# Patient Record
Sex: Female | Born: 1935 | Race: White | Hispanic: No | Smoking: Never smoker
Health system: Southern US, Community
[De-identification: ages and names within clinical notes are randomized; demographics above are authoritative.]

## PROBLEM LIST (undated history)

## (undated) DIAGNOSIS — I10 Essential (primary) hypertension: Secondary | ICD-10-CM

## (undated) DIAGNOSIS — M549 Dorsalgia, unspecified: Secondary | ICD-10-CM

## (undated) DIAGNOSIS — I5022 Chronic systolic (congestive) heart failure: Secondary | ICD-10-CM

## (undated) DIAGNOSIS — I639 Cerebral infarction, unspecified: Secondary | ICD-10-CM

## (undated) DIAGNOSIS — E079 Disorder of thyroid, unspecified: Secondary | ICD-10-CM

## (undated) DIAGNOSIS — E785 Hyperlipidemia, unspecified: Secondary | ICD-10-CM

## (undated) DIAGNOSIS — G8929 Other chronic pain: Secondary | ICD-10-CM

## (undated) DIAGNOSIS — N184 Chronic kidney disease, stage 4 (severe): Secondary | ICD-10-CM

## (undated) DIAGNOSIS — I272 Pulmonary hypertension, unspecified: Secondary | ICD-10-CM

## (undated) DIAGNOSIS — I482 Chronic atrial fibrillation, unspecified: Secondary | ICD-10-CM

## (undated) DIAGNOSIS — E039 Hypothyroidism, unspecified: Secondary | ICD-10-CM

## (undated) HISTORY — PX: OTHER SURGICAL HISTORY: SHX169

## (undated) HISTORY — DX: Pulmonary hypertension, unspecified: I27.20

## (undated) HISTORY — PX: APPENDECTOMY: SHX54

## (undated) HISTORY — PX: EYE SURGERY: SHX253

## (undated) HISTORY — DX: Cerebral infarction, unspecified: I63.9

## (undated) HISTORY — PX: TONSILLECTOMY: SUR1361

---

## 1997-06-27 ENCOUNTER — Ambulatory Visit (HOSPITAL_COMMUNITY): Admission: RE | Admit: 1997-06-27 | Discharge: 1997-06-27 | Payer: Self-pay | Admitting: *Deleted

## 2001-11-09 ENCOUNTER — Encounter: Payer: Self-pay | Admitting: Internal Medicine

## 2001-11-09 ENCOUNTER — Encounter: Admission: RE | Admit: 2001-11-09 | Discharge: 2001-11-09 | Payer: Self-pay | Admitting: Internal Medicine

## 2005-04-10 ENCOUNTER — Inpatient Hospital Stay (HOSPITAL_COMMUNITY): Admission: EM | Admit: 2005-04-10 | Discharge: 2005-04-13 | Payer: Self-pay | Admitting: Emergency Medicine

## 2005-08-08 ENCOUNTER — Ambulatory Visit (HOSPITAL_COMMUNITY): Admission: RE | Admit: 2005-08-08 | Discharge: 2005-08-08 | Payer: Self-pay | Admitting: Cardiology

## 2005-08-08 ENCOUNTER — Encounter: Payer: Self-pay | Admitting: Vascular Surgery

## 2005-08-26 ENCOUNTER — Inpatient Hospital Stay (HOSPITAL_COMMUNITY): Admission: RE | Admit: 2005-08-26 | Discharge: 2005-08-27 | Payer: Self-pay | Admitting: Interventional Cardiology

## 2005-11-05 ENCOUNTER — Encounter: Admission: RE | Admit: 2005-11-05 | Discharge: 2005-11-05 | Payer: Self-pay | Admitting: Internal Medicine

## 2006-08-15 ENCOUNTER — Inpatient Hospital Stay (HOSPITAL_COMMUNITY): Admission: EM | Admit: 2006-08-15 | Discharge: 2006-08-24 | Payer: Self-pay | Admitting: *Deleted

## 2006-08-20 ENCOUNTER — Encounter (INDEPENDENT_AMBULATORY_CARE_PROVIDER_SITE_OTHER): Payer: Self-pay | Admitting: Interventional Cardiology

## 2008-04-12 ENCOUNTER — Encounter: Admission: RE | Admit: 2008-04-12 | Discharge: 2008-04-12 | Payer: Self-pay | Admitting: Cardiology

## 2008-09-12 ENCOUNTER — Encounter: Admission: RE | Admit: 2008-09-12 | Discharge: 2008-09-12 | Payer: Self-pay | Admitting: Internal Medicine

## 2010-08-07 NOTE — Op Note (Signed)
Tonya Farley, Tonya Farley             ACCOUNT NO.:  [id number]   MEDICAL RECORD NO.:  [id number]          PATIENT TYPE:  INP   LOCATION:  4705                         FACILITY:  [facility]   PHYSICIAN:  Francisca December, M.D.  DATE OF BIRTH:  Jul 30, 1919   DATE OF PROCEDURE:  08/20/2006  DATE OF DISCHARGE:                               OPERATIVE REPORT   PROCEDURE PERFORMED:  1. Transesophageal echocardiogram.  2. Direct current cardioversion.   INDICATIONS:  Tonya Farley is an 77 year old woman who presented with  atrial fibrillation with rapid ventricular response and mild CHF.  She  has been therapeutically anticoagulated with heparin, Lovenox, and then  warfarin.  Her INR today is 2.3.  She has undergone a clearing TEE  revealing no significant thrombus in the left atrium or in the left  atrial appendage.  She is to undergo direct current cardioversion at  this time.   PROCEDURAL NOTE:  While monitoring heart rate, blood pressure, O2  saturation and ECG and after the IV administration of a total of 4 mg of  midazolam and 100 mcg of fentanyl, the patient received a single  transthoracic dose of direct current energy 150 joules synchronized  biphasic.  This resulted in prompt return of sinus rhythm 60 beats per  minute.   IMPRESSION:  Successful elective cardioversion of atrial fibrillation  with rapid ventricular response to sinus rhythm.   PLAN:  1. Continue warfarin likely indefinite.  2. Amiodarone 150 mg IV now.  She has had two runs of SVT since the      cardioversion was completed.  3. Begin sotalol 80 mg p.o. every 12 hours watching QTC carefully.      Francisca December, M.D.  Electronically Signed     JHE/MEDQ  D:  08/20/2006  T:  08/20/2006  Job:  [id number]

## 2010-08-07 NOTE — H&P (Signed)
Tonya Farley             ACCOUNT NO.:  [id number]   MEDICAL RECORD NO.:  [id number]          PATIENT TYPE:  INP   LOCATION:  1849                         FACILITY:  [facility]   PHYSICIAN:  Corky Crafts, MDDATE OF BIRTH:  1920/03/14   DATE OF ADMISSION:  08/15/2006  DATE OF DISCHARGE:                              HISTORY & PHYSICAL   HISTORY:  Tonya Farley is an 76 year old female with a 3-day history  of intermittent palpitations associated with weakness, chest tightness,  and mild dyspnea with exertion.  Symptoms were worse this morning with a  rapid rate.  She was down in the emergency room with a heart rate in the  130 range.  EKG shows atrial fibrillation with RVR, with a heart rate of  130, isolated PVC, no ST-T wave changes from April 11, 2005 EKG.   Cardiac catheterization in January 2007 showed no significant coronary  artery disease, EF 75%.  She has known bilateral renal artery stenosis  and underwent stent placement to the left renal artery in June of 2007.  She also has right renal artery stenosis at 50%.  She has further  peripheral vascular disease with a 90% stenosis at the right TP trunk,  left SFA occlusion, medically managed.   ALLERGIES:  PENICILLIN AND SULFA.   MEDICATIONS:  Norvasc 5 mg a day, baby aspirin daily, labetalol 100 mg  b.i.d., Lasix 20 mg a day, Lipitor 5 mg a day, Plavix 75 mg a day, Zetia  5 mg a day.   FAMILY HISTORY:  Mom died of rectal cancer.  Dad died at age 37 from a  myocardial infarction.  She has a sister who has had a myocardial  infarction and now has a defibrillator.   SOCIAL HISTORY:  No tobacco, alcohol, or illicit drug use.   PAST MEDICAL HISTORY:  1. Peripheral vascular disease as above.  2. Hypertension with LVH.  3. History of tachycardia.  4. Hyperlipidemia.  5. Restless legs syndrome.  6. History of medication noncompliance.  7. She has undergone a right breast lumpectomy which she described as  non-cancerous.  She denies any history of radiation or      chemotherapy.  8. She has undergone an appendectomy as well as a tonsillectomy.   PHYSICAL EXAMINATION:  VITAL SIGNS:  Blood pressure 158/78, pulse  ranging from 90 to 140 beats per minute, respirations 18, temperature  98.2.  HEENT:  Grossly normal.  Neck:  No carotid bruits.  No JVD or thyromegaly.  CHEST:  Clear to auscultation bilaterally.  No wheezing or rhonchi.  HEART:  Irregular.  No gross murmur.  ABDOMEN:  Soft, nontender.  No bruits.  EXTREMITIES:  Lower extremities no peripheral edema.  Faint palpable  right lower extremity pulse with non-palpable left lower extremity  pulse.  However, the left lower extremity is pink and warm.  NEURO:  Alert and oriented times 3.  Normal mood and affect.  SKIN:  Warm and dry.   LAB STUDIES:  Show BUN 23, creatinine 1.5, potassium 3.6, sodium 140.  Hemoglobin 13.3, hematocrit 39.  Point-of-care markers are negative with  a troponin of less than 0.05.   Chest x-ray shows cardiomegaly with low lung volumes with worsening of  bibasilar and perihilar atelectasis versus infiltrates.   ASSESSMENT/PLAN:  1. Atrial fibrillation with RVR.  2. Peripheral vascular disease as described above.  3. Hypertension with LVH.  4. Hyperlipidemia.  Treated.  5. Family history of coronary artery disease.  6. Atelectasis on the chest x-ray.  She has no fever or chills.  I      will repeat the chest x-ray tomorrow.  7. Mild renal insufficiency.  Check BMET in the morning.   Start IV Cardizem.  Stop Norvasc.  Cycle enzymes.  Check electrolytes as  well as TSH.  Check a repeat chest x-ray in the morning.  Admit to  telemetry floor.      Guy Franco, P.A.      Corky Crafts, MD  Electronically Signed    LB/MEDQ  D:  08/15/2006  T:  08/15/2006  Job:  [id number]   cc:   Tonya Farley, M.D.

## 2010-08-10 NOTE — Discharge Summary (Signed)
[name], Tonya Farley             ACCOUNT NO.:  [id number]   MEDICAL RECORD NO.:  [id number]          PATIENT TYPE:  INP   LOCATION:  6527                         FACILITY:  [facility]   PHYSICIAN:  Jonna L. Robb Matar, M.D.DATE OF BIRTH:  1919-05-07   DATE OF ADMISSION:  04/10/2005  DATE OF DISCHARGE:  04/13/2005                                 DISCHARGE SUMMARY   PRIMARY CARE PHYSICIAN:  Antony Madura, M.D.   CARDIOLOGIST:  Francisca December, M.D.   ALLERGIES:  PENICILLIN and SULFA, both give rashes.   FINAL DIAGNOSES:  1.  Noncardiac chest pain.  2.  Uncontrolled hypertension.  3.  Renal artery stenosis.  4.  Hyperlipidemia.   PROCEDURE:  On January 19th, left heart catheterization, abdominal  aortogram, selective renal angiography. Findings include LVH with a  hyperdynamic ejection fraction of 75%. No significant coronary artery  disease, bilateral renal artery stenosis with 75% on the left, 70% on the  right.   HOSPITAL COURSE:  This is a 77 year old white female with hypertension and  cholesterolemia developed chest pain, diaphoresis. She had restless leg  problems. The pain lasted until she was given nitroglycerin by the emergency  department  physician. She has a history of normal stress test in August  2006. Blood pressures were 151/59, 179/72.   Dr. Amil Amen was called on consultation. The patient ruled out for an MI. The  patient does take Advil, it was thought she might have some epigastric  rather some substernal. Because it was difficult to tell she underwent a  procedure and was found to have a source for her hypertension.   DISPOSITION:  The patient is going to be discharged on labetalol increased  to 200 mg b.i.d., Norvasc 5 mg daily, clonazepam as needed for her legs,  Tranxene 7.5 half tablet b.i.d. as needed, Crestor 5 mg daily, Centrum  Silver. She should also try some Prilosec OTC and avoid Advil.      Jonna L. Robb Matar, M.D.  Electronically  Signed     JLB/MEDQ  D:  04/13/2005  T:  04/14/2005  Job:  [id number]   cc:   Antony Madura, M.D.  Fax: [phone]   Francisca December, M.D.  Fax: [phone]

## 2010-08-10 NOTE — Consult Note (Signed)
Tonya Farley, MAGISTRO             ACCOUNT NO.:  [id number]   MEDICAL RECORD NO.:  [id number]          PATIENT TYPE:  INP   LOCATION:  6527                         FACILITY:  [facility]   PHYSICIAN:  Francisca December, M.D.  DATE OF BIRTH:  Jan 20, 1920   DATE OF CONSULTATION:  04/11/2005  DATE OF DISCHARGE:                                   CONSULTATION   REFERRING Braylynn Ghan:  [facility].   REASON FOR CONSULTATION:  Chest pain.   HISTORY OF PRESENT ILLNESS:  Tonya Farley is an 77 year old very pleasant  lady who is the sister of a long-time patient of mine, Mrs. Redge Gainer.  She was awoken in the wee hours of April 10, 2005 by anterior substernal  chest pain that persisted for several hours and was associated with  diaphoresis.  She eventually called EMS and was transported to [facility], receiving aspirin and some sublingual nitroglycerin en route.  The  discomfort in her chest, which she described as a heavy pressure without  radiation, slowly resolved after the nitroglycerin was given and  subsequently in the emergency room.  She denies any accompanying dyspnea.  She had pain in her legs as well, but commonly has this and is treated by  Dr. Su Hilt for restless leg syndrome with clonazepam.  She did have a  cardiology ergometric distal bicycle stress test in August '06 which was  felt to be unremarkable.   PAST MEDICAL HISTORY:  1.  Remote history of tachycardia.  2.  Hypertension.  3.  Hypercholesterolemia.   PAST SURGICAL HISTORY:  1.  Right breast lumpectomy.  2.  Appendectomy.  3.  Bilateral cataract surgery.  4.  Remote tonsillectomy.   CURRENT MEDICATIONS:  1.  Centrum vitamin.  2.  Clonazepam 0.5 mg p.o. nightly.  3.  Labetalol 100 mg p.o. twice daily.  4.  Clorazepate 7.5 mg one-half p.o. twice daily.  5.  Crestor 5 mg p.o. daily.   SOCIAL HISTORY AND HABITS:  She uses no alcohol or tobacco.  She has never  been a smoker.  Caffeine intake is not  excessive.   FAMILY HISTORY:  Mother died of rectal cancer.  Father died of an MI at age  67.  Her sister had an anterior MI approximately 8-10 years ago and has  severe LV dysfunction with a defibrillator in place.   CURRENT MEDICATIONS IN THE HOSPITAL:  1.  Lovenox 40 mg subcu daily.  2.  Protonix 40 mg p.o. daily.  3.  Norvasc 5 mg p.o. daily.  4.  Zocor 20 mg p.o. daily.  5.  Labetalol.  6.  Aspirin.   REVIEW OF SYSTEMS:  Review of systems otherwise negative.   PHYSICAL EXAMINATION:  VITAL SIGNS:  The blood pressure is 140-180/60, pulse  is 80 and regular, respiratory rate 20, temperature 98, O2 saturation 94% on  room air.  GENERAL:  This is a well-appearing 77 year old woman in no acute distress.  HEENT:  Unremarkable.  The head is normocephalic and atraumatic.  The pupils  are equal, round and reactive to light and accommodation.  Extraocular  movements are intact.  Oral mucosa is pink and moist.  Tongue is not coated.  NECK:  Neck is supple without thyromegaly or masses.  Carotid upstrokes are  normal.  No bruit.  No jugular venous distention.  CHEST:  Her chest is clear with adequate excursion, no wheezes, rales or  rhonchi.  HEART:  The heart has a regular rhythm.  Normal S1 and S2 are normal.  No  S3, S4, murmur, click or rub noted.  ABDOMEN:  The abdomen is soft, flat and nontender without hepatosplenomegaly  or midline pulsatile mass.  Bowel sounds are present in all quadrants.  GU:  External genitalia are atrophic.  RECTAL:  Not performed.  EXTREMITIES:  Extremities show a full range of motion, no edema, intact  distal pulses.  NEUROLOGICAL:  Cranial nerves II-XII are intact.  Motor and sensory are  grossly intact.  Gait not tested.  SKIN:  Skin is warm, dry and clear.   ACCESSORY CLINICAL DATA:  CK-MB and troponins are negative x3.  Serum  electrolytes are normal.  Admission hemogram is normal.   Chest x-ray shows no active cardiopulmonary disease.   The  electrocardiogram on admission showed criteria for LVH by R wave in aVL  and nonspecific T wave abnormality,  subsequent EKG this morning unchanged.   ASSESSMENT:  1.  Episode of rest angina pectoris, therefore unstable.  2.  No evidence of myocardial infarction.  3.  Hypertension, systolic.  4.  Evidence of end-organ damage from hypertension (left ventricular      hypertrophy).  5.  Multiple risk factors for coronary disease including age, hypertension,      dyslipidemia and family history.   PLAN:  1.  I agree with your management thus far.  2.  I have recommended cardiac catheterization and coronary angiography for      diagnosis and possible PCI.  Goals, risks and alternatives were      discussed with the patient.  The patient states understanding, has had      her questions answered, and wishes to proceed.   Thank you very much for allowing me to assist in the care of Ms. Maire  Farley.  It has been a pleasure to do so.  I will discuss her further  care with you.      Francisca December, M.D.  Electronically Signed     JHE/MEDQ  D:  04/11/2005  T:  04/12/2005  Job:  [id number]   cc:   Antony Madura, M.D.  Fax: [phone]

## 2010-08-10 NOTE — H&P (Signed)
Tonya Farley, Tonya Farley             ACCOUNT NO.:  [id number]   MEDICAL RECORD NO.:  [id number]          PATIENT TYPE:  EMS   LOCATION:  MAJO                         FACILITY:  [facility]   PHYSICIAN:  Michaelyn Barter, M.D. DATE OF BIRTH:  [birth year]   DATE OF ADMISSION:  04/10/2005  DATE OF DISCHARGE:                                HISTORY & PHYSICAL   PRIMARY CARE PHYSICIAN:  Dr. Burton Apley.   CHIEF COMPLAINT:  Chest pain.   HISTORY OF PRESENT ILLNESS:  Tonya Farley is an 77 year old female with a  past medical history of hypertension and hypercholesterolemia.  She states  that sometime after midnight last night, she was awakened by chest pain  accompanied by diaphoresis.  Her pajama's became soaked with sweat and she  therefore had to change them.  She has a chronic history with her legs  hurting at night time and she has been prescribed medication by her primary  care physician for this.  She states that shortly after waking up with chest  pain, both of her legs began to hurt.  When I asked her if this was  something new, she said, no, she has had it for quite some time.  Because of  the pain in her legs, it is very difficult for her to keep them still.  She  denies having any shortness of breath.  The chest pain is described as a  heavy pressure.  There is no radiation to the neck, arm or back.  It lasted  until nitroglycerin was given by the emergency room physician. She has never  had similar pain before.  No changes in her breathing with regards with  exertion.  The patient had a cardiac stress test completed in August of 2006  and was found to be normal.  This was done by her primary care physician in  his office.  During that time, she says she rode a bicycle.   PAST MEDICAL HISTORY:  1.  Tachycardia.  2.  Hypertension.  3.  Hypercholesterolemia.   PAST SURGICAL HISTORY:  1.  Right breast lumpectomy which was found to be benign.  2.  Appendectomy.  3.  Bilateral  cataract removal.  4.  Tonsillectomy.   ALLERGIES:  Penicillin produces a rash.  Sulfa produces a rash.   CURRENT MEDICATIONS:  1.  Centrum vitamins.  2.  Clonazepam one-half of a tablet q.h.s. for her legs.  3.  Labetalol 100 mg p.o. b.i.d.  4.  Clorazepate dipot 7.5 mg, a half a tablet b.i.d. p.r.n.  5.  Crestor 10 mg, one half a tablet daily.   SOCIAL HISTORY:  Cigarettes:  Never.  Alcohol:  Never.   FAMILY HISTORY:  Mother:  Rectal cancer.  Father died from myocardial  infarction at the age of 51.  Sister had a history of pacemaker and  defibrillator.   REVIEW OF SYSTEMS:  As per history of present illness.  Otherwise, all other  systems are negative.   PHYSICAL EXAMINATION:  GENERAL:  The patient is awake.  She is cooperative.  She is in no obvious distress.  VITAL SIGNS:  Temperature 97.0, blood  pressure 151/59, heart rate 72, respirations 20, O2 saturation 96% on room  air.  HEENT:  Head is normocephalic, atraumatic.  Pupils are anicteric.  Decreased reactivity to light.  Extraocular muscles are intact.  Oral mucosa  is pink.  Dentures are present.  No obvious thrush.  NECK:  Supple.  No  lymphadenopathy.  No JVD.  Strong carotid upstrokes bilaterally.  No carotid  bruits.  CARDIAC:  S1 and S2 is present.  Heart sounds are distant.  RESPIRATORY:  Lungs are clear.  No crackles or wheezes.  ABDOMEN:  Soft,  nontender and nondistended.  Positive biopsy x4 quadrants.  No masses  palpated.  EXTREMITIES:  No leg edema.   LABORATORY DATA:  ABG was completed with a pH of 7.460, pCO2 of 38.2, bicarb  27.2, hemoglobin 12.6, hematocrit 37.0.  D-dimer is 0.31.  Sodium 138,  potassium 3.8, chloride 108, CO2 140, BUN 18, creatinine 1.3, glucose 140.  CK MB, PLC 1.5.  Troponin I, less than 0.05.  Myoglobin 148.  Chest x-ray  was completed, reveals no congestive heart failure, increased basilar  markings, probably chronic but cannot rule out active disease.  EKG was also  completed;  sinus rhythm, bradycardic with the rate in the 50s, good R wave  transition is achieved by V3.  No Q waves.  No ST segment changes.   ASSESSMENT AND PLAN:  Tonya Farley is an 77 year old female with a past  medical history of hypertension and hypercholesterolemia here for evaluation  of acute onset of chest pain.  1.  Chest pain.  Rule out myocardial infarction.  The source of the      patient's chest pain will be worked up from a cardiac versus noncardiac      perspective.  The patient reports having had a stress test completed      only five months ago and at that time, it was determined to be normal.      Given a history such as that, one has to be concerned that more than      likely this may not be cardiac chest pain however, the patient does have      multiple risk factors therefore, will rule the patient out for cardiac      event via checking cardiac enzymes, troponin I and CK MB x3, q.8h.      apart.  Will also provide aspirin, oxygen and will consider consultation      with cardiology.  2.  Hypertension.  This is currently uncontrolled.  Will review the      patient's home medications and will consider either increasing the      dosages of the medications or adding an additional blood pressure      medication.  3.  History of hyperlipidemia.  Will continue the patient's Crestor for now.  4.  Gastrointestinal prophylaxis.  Will provide Protonix.  5.  Deep vein thrombosis prophylaxis.  Will provide Lovenox.      Michaelyn Barter, M.D.  Electronically Signed     OR/MEDQ  D:  04/10/2005  T:  04/10/2005  Job:  [id number]   cc:   Antony Madura, M.D.  Fax: [phone]

## 2010-08-10 NOTE — Discharge Summary (Signed)
Tonya Farley, Tonya Farley             ACCOUNT NO.:  [id number]   MEDICAL RECORD NO.:  [id number]          PATIENT TYPE:  INP   LOCATION:  4705                         FACILITY:  [facility]   PHYSICIAN:  Francisca December, M.D.  DATE OF BIRTH:  [birth year]   DATE OF ADMISSION:  08/15/2006  DATE OF DISCHARGE:  08/24/2006                               DISCHARGE SUMMARY   ADMISSION DIAGNOSES:  1. Atrial fibrillation with rapid ventricular response.  2. Dyspnea.   DISCHARGE DIAGNOSIS:  1. Atrial fibrillation with rapid ventricular response, status post      transesophageal echo with direct-current cardioversion to sinus      rhythm.  2. Dyspnea secondary to acute congestive heart failure exacerbation      secondary to #1, compensated.  3. Systemic anticoagulation with therapeutic INR.  4. Cardiac catheterization in January 2007 revealed no significant      coronary artery disease with an ejection fraction of 75%.  5. Bilateral renal artery stenosis, status post left renal artery      stent in June 2007.  Right renal artery stenosis of 50%.  6. Peripheral vascular disease with 90% stenosis at the right      posterior tibial trunk, left superficial femoral artery occlusion,      medically managed.  7. Hypertension with moderate control.  8. Renal insufficiency, rule out acute versus chronic.  9. Nonsustained ventricular tachycardia.  10.Marked sinus bradycardia, improved.   CONSULTATIONS:  None.   PROCEDURE:  Transesophageal echo with direct-current cardioversion, Aug 20, 2006.   HOSPITAL COURSE:  Tonya Farley is an 77 year old female who was  admitted to the [facility] with atrial fibrillation with RVR  and a mild acute CHF exacerbation secondary to atrial fibrillation.  She  has been therapeutically anticoagulated with heparin, Lovenox, and then  warfarin.  During this admission, she underwent a TEE that ruled out  thrombus in the left atrium or in the left atrial appendage;  as a  result, she underwent a DCCV with successful conversion to sinus rhythm.  Warfarin was continued.  Prior to the DCCV, she was on IV Cardizem,  which was switched to oral; that was later discontinued and following  the DCCV procedure, she was started on amiodarone 150 mg via IV after  having 2 runs of nonsustained SVT since the cardioversion had been  completed.  She was also started on sotalol 80 mg every 12 hours with a  plan to watch the QTc.  The patient was started on Lasix for treatment  of mild heart failure, with improvement in dyspnea.  Her labetalol was  switched to metoprolol during this admission prior to the DCCV.  Also  during this admission, the patient experienced mild renal insufficiency,  rule out acute versus chronic; this may have been exacerbated by  diuretics.  Sotalol dosage was decreased to 80 mg daily secondary to a  prolonged QTc of 568 milliseconds, as well as a decreased creatinine  clearance.  A decision was made to discontinued the patient's  antiarrhythmic therapy so that her QTc could resolve.  She was  discharged to  home in stable condition on August 24, 2006 without  complaints or symptoms, and was scheduled for a followup appointment at  [facility] on the following day.   LABORATORY DATA:  White blood count 8.7, hemoglobin 12.3, hematocrit  37.1, platelets 261,000.  PT 31.8, INR 2.9, PTT greater than 200.  Heparin level 0.49.  Sodium 143, potassium 3.9, chloride 106, CO2 27,  glucose 93, BUN 21, creatinine 1.47, total bilirubin 1.2, alkaline  phosphatase 74, AST 38, ALT 46, total protein 6.3, serum albumin 3.6.  TSH 3.761.  BNP 556 prior to discharge.  Serial cardiac enzymes:  CK  total 113, 104, and 77; CK-MB 4.0, 3.9, and 3.3; troponin I 0.03, 0.06,  and 0.04.  Point-of-care enzymes:  Myoglobin 189, CK-MB 2.8, troponin I  less than 0.05.   CHEST X-RAYS:  PA and lateral chest x-ray, Jul 28, 2006, revealed  interval improvement in interstitial  edema with persistent bibasilar  opacities, stable cardiomegaly with small effusions.   TWELVE LEAD EKG:  EKG, Aug 21, 2006, revealed sinus rhythm with  occasional PVCs, with a ventricular rate of 64 beats per minute.  There  were no acute ischemic changes noted on this EKG.   CONDITION ON DISCHARGE:  Stable.   DISCHARGE MEDICATIONS:  1. Lipitor 5 mg daily.  2. Zetia 10 mg one-half tablet daily.  3. Aspirin 81 mg daily.  4. Lasix 40 mg daily; this represents an increase in dosage; a      prescription was given with refills.  5. Potassium chloride 20 mEq daily; this represented a new      prescription; a prescription was given with refills.  6. Warfarin 5 mg one-half tablet daily as directed; this represented a      new prescription; a prescription was given with refills.  7. Plavix 75 mg daily.  8. Labetalol 100 mg twice daily.  9. Norvasc 5 mg daily.   DISCHARGE INSTRUCTIONS:  1. Discontinued sotalol.  2. Continue a low-sodium, low-fat, and low-cholesterol diet.  3. Increase activity slowly.  4. Stop any activity that causes chest pain, shortness of breath,      dizziness, sweating, or excessive weakness.   FOLLOWUP APPOINTMENT:  1. [facility] for PT and INR check on June 2 at      12 p.m.  2. Followup appointment with Dr. Corliss Marcus with an EKG on June 2 at      11:40 a.m.  The patient was scheduled to call [phone] if she was      unable to keep the scheduled appointment.     Tylene Fantasia, [place]      Francisca December, M.D.  Electronically Signed   RDM/MEDQ  D:  09/03/2006  T:  09/04/2006  Job:  [id number]

## 2010-08-10 NOTE — Op Note (Signed)
Tonya Farley, Tonya Farley             ACCOUNT NO.:  [id number]   MEDICAL RECORD NO.:  [id number]          PATIENT TYPE:  INP   LOCATION:  6533                         FACILITY:  [facility]   PHYSICIAN:  Corky Crafts, MDDATE OF BIRTH:  05-Nov-1919   DATE OF PROCEDURE:  08/26/2005  DATE OF DISCHARGE:  08/27/2005                                 OPERATIVE REPORT   REFERRING PHYSICIAN:  Dr. Corliss Marcus.   PRIMARY PHYSICIAN:  Dr. Burton Apley.   PROCEDURES PERFORMED:  Bilateral lower extremity runoffs, bilateral  selective renal angiogram, PTA/stent of the left renal artery.   OPERATOR:  Dr. Eldridge Dace.   INDICATIONS:  Refractory hypertension and lower extremity claudication.   PROCEDURAL NARRATIVE:  The risks and benefits of peripheral angiography and  intervention were explained to the patient, and informed consent was  obtained.  The patient was brought to the peripheral vascular lab.  One  percent lidocaine was infiltrated into her right groin.  A 6-French arterial  sheath was placed in the patient's right femoral artery.  A pigtail catheter  was advanced into the infrarenal abdominal aorta.  Power injection of  contrast was performed, and bilateral lower extremity runoffs were  performed.  A 5-French JR-4 diagnostic catheter was then used to engage the  ostium of the right renal artery.  Digital angiography was performed using  hand injection of contrast.  Catheter was then used to engage the left renal  artery.  Digital angiography was performed using hand injection of contrast.  A stabilizer wire was then placed across the lesion in the left renal  artery.  The diagnostic catheter was then withdrawn with the stabilizer wire  remaining in place.  A Wholey wire was advanced into the aorta.  A 6-French  IMA guiding catheter was then advanced over both the stabilizer wire and the  Wholey wire to the level of the left renal artery.  The [facility] wire was  withdrawn, and the IMA guiding  catheter was advanced into the ostium of the  left renal artery.  Two-hundred mcg of the intra-arterial nitroglycerin were  administered through the guiding catheter.  A 3.0 x 12 mm Maverick Balloon  was advanced across the lesion and inflated to 8 atmospheres for 30 seconds.  The balloon was withdrawn, and then a 4.0 x 15 mm Genesis Stent was advanced  across the lesion and inflated to 14 atmospheres for 30 seconds.  There was  an excellent angiographic result with normal flow into the kidney.  Other  findings:  There was a 50% right renal artery stenosis; there is an 80% left  renal artery stenosis with a 90-mm pressure gradient.  Angiography of the  right leg revealed that the right iliac system was widely patent.  The right  SFA had moderate atherosclerosis without any areas of focal stenosis.  The  right profunda was widely patent.  There was a 90% stenosis of the right TP  trunk.  The anterior tibial artery on the right was widely patent.  The left  leg showed a widely patent iliac system.  The left SFA was occluded at  its  origin, and it reconstitutes in the distal portion of the thigh.  The distal  SFA was supplied by collaterals from the left profunda femoral.  The left  anterior tibial was widely patent.  The left TP trunk appeared patent.  Below the knee, only the dorsalis pedis was seen well from that point.   IMPRESSION:  1.  Bilateral renal artery stenosis including an 80% left renal artery      stenosis.  The right renal artery has a moderate stenosis.  2.  Successful PTA/stent to the left renal artery with a 4.0 x 15 mm Genesis      Stent.  3.  Occluded left SFA with collaterals from the profunda femoral.  4.  Moderate atherosclerosis of the right superficial femoral artery.  5.  Significant disease of the right TP trunk.   RECOMMENDATIONS:  1.  The patient should continue aspirin 81 mg p.o. daily indefinitely.  Will      add Plavix 75 mg daily for 30 days.  The patient  received a 300-mg bolus      postprocedure.  2.  When the Plavix course is finished, will likely try Pletal and encourage      walking to hopefully improve her circulation to her left leg.  At this      point, I will not plan any further lower extremity interventions.      Because her pain in her right leg was actually worse than her left leg,      it makes me question whether this was in fact vascular pain.  3.  Will follow blood pressure closely.  Hopefully, we will be able to      reduce the amount of blood pressure medications that she is on.  4.  Will follow her renal function closely as well.  The patient will be      watched overnight and hydrated aggressively with sodium bicarbonate and      half normal saline.  5.  I will see the patient in the office in followup.      Corky Crafts, MD  Electronically Signed     JSV/MEDQ  D:  08/26/2005  T:  08/27/2005  Job:  [id number]

## 2010-08-10 NOTE — Cardiovascular Report (Signed)
Tonya Farley             ACCOUNT NO.:  [id number]   MEDICAL RECORD NO.:  [id number]          PATIENT TYPE:  INP   LOCATION:  6527                         FACILITY:  [facility]   PHYSICIAN:  Francisca December, M.D.  DATE OF BIRTH:  1919/10/21   DATE OF PROCEDURE:  04/12/2005  DATE OF DISCHARGE:                              CARDIAC CATHETERIZATION   PROCEDURES PERFORMED:  1.  Left heart catheterization.  2.  Left ventriculogram.   1.  Abdominal aortogram.  2.  Coronary angiography.  3.  Selective bilateral renal artery angiography.   INDICATIONS:  Tonya Farley is an 77 year old woman who was admitted,  April 10, 2005, after prolonged episode of anginal chest pain.  She ruled  out for myocardial infarction.  Because of her age and other risk factors,  she is brought to catheterization laboratory at this time to identify the  extent of disease and provide for further therapeutic options.  She has a  long history of hypertension currently treated with labetalol 100 b.i.d. and  Norvasc 5 mg p.o. every day.  Systolic blood pressures have been in the  range of 160 to 180 since admission.   PROCEDURAL NOTE:  The patient is brought to cardiac catheterization  laboratory in the fasting state.  The right groin was prepped and draped in  the usual sterile fashion.  Local anesthesia was obtained with the  infiltration of 1% lidocaine.  A 6-French catheter sheath was inserted  percutaneously into the right femoral artery, utilizing an anterior approach  over a guiding J-wire.  A 110-cm pigtail catheter was used to measure  pressures in the ascending aorta and in the left ventricle, both prior to  and following the ventriculogram.  A 30 degree RAO cine left ventriculogram  was performed utilizing a power injector.  The pigtail catheter was then  withdrawn into the abdominal aorta and placed over the second lumbar  vertebrae. A cine angiogram the of the abdominal aorta was performed in  the  AP projection utilizing a power injector, 30 cc were injected at 15 cc/sec.  The patient then received a sublingual administration of 0.4 mg  nitroglycerin and cine angiography of each coronary artery was conducted in  multiple LAO and RAO projections, utilizing 6-French #4 left and right  Judkins catheters.  All catheter manipulations were performed using  fluoroscopic observation and exchanges performed over a long guiding J-wire.  The right coronary catheter was then withdrawn into the abdominal aorta and  used to perform selective renal angiography of the right and left renal  arteries.  Each was visualized by hand injection and AP projection.  The  right coronary catheter was then removed and a right femoral arteriogram  performed in the RAO angulation via the catheter sheath, again by hand  injection.  This documented adequate anatomy for placement of the  percutaneous closure device, AngioSeal.  This was successfully deployed with  good hemostasis and an intact distal pulse.   HEMODYNAMIC RESULTS:  1.  Systemic arterial pressure was 209/79 with a mean of 132-mmHg.  There      was no systolic  gradient across the aortic valve.  2.  The left ventricular end-diastolic pressure was 7-mmHg pre-      ventriculogram, and 12-mmHg post ventriculogram.   ANGIOGRAPHY:  1.  The left ventriculogram demonstrated normal chamber size and normal      global systolic function without regional wall motion abnormality.  A      visual estimate of the ejection fraction is 75%.  There is clear      moderate concentric hypertrophy seen.  The aortic valve is trileaflet      and opens normally during systole.  There is no significant mitral      regurgitation.  There is minimal left coronary artery calcification      seen.  2.  The abdominal aortogram revealed diffuse atherosclerotic disease but      without significant obstruction.  The very proximal common iliac      arteries are free of  obstruction.  The left renal artery appears to have      a 50% or more stenosis.  The left renal artery also seems to have a      eccentric stenosis in the range of 50%.  Neither vessel was extremely      well visualized.  3.  There was a right dominant coronary system present.  The main left      coronary artery was normal.  4.  The left anterior descending artery and its branches were without      significant obstruction.  There is a 30% mid vessel stenosis just after      the origin of a large diagonal branch.  The diagonal branch itself has a      30% stenosis proximally.  There is diffuse disease in the proximal      circumflex but not greater than 20% stenosis in any portion.  The      ongoing anterior descending artery reaches and barely traverses the      apex.  5.  The left circumflex coronary artery and its branches are free of any      significant disease.  There are two large marginal branches and then a      very small proximal first marginal branch.  Again, no obstructions seen      in the circumflex system.  6.  The right coronary and its branches are minimally diseased as well.      There is a tubular 30-40% narrowing in the proximal to mid segment.  The      distal vessel appears free of disease and gives rise to a large      posterior descending artery and a large posterolateral segment with a      single moderate size left ventricular branch.  7.  Selective renal angiography demonstrates a 75% narrowing beginning at      the ostium of the left renal artery and there was pressure catheter      pressure damping when the vessel was engaged of at least 40-mmHg.  The      right renal artery demonstrates a 70% narrowing as well.  This was an      eccentric lesion and extends over the first 12-mm of the artery.   FINAL IMPRESSION:  1.  Systemic hypertension with likely significant renal vascular component. 2.  Evidence of hypertensive heart disease with moderate concentric  left      ventricular hypertrophy.  3.  No significant obstructive coronary disease.  4.  Intact global left  ventricular systolic function. Ejection fraction 75%.   PLANS/RECOMMENDATIONS:  1.  Aggressive hypertensive treatment should be undertaken.  The patient is      currently managed with labetalol and Norvasc.  A third drug will likely      be necessary or at least an increase in her labetalol dose.  2.  She has normal renal function.  I will discuss possible intervention of      the renal arteries with Dr. Everette Rank and proceed as he feels      appropriate.      Francisca December, M.D.  Electronically Signed     JHE/MEDQ  D:  04/12/2005  T:  04/12/2005  Job:  [id number]   cc:   Antony Madura, M.D.  Fax: [phone]

## 2010-08-10 NOTE — Discharge Summary (Signed)
Tonya Farley, Tonya Farley             ACCOUNT NO.:  [id number]   MEDICAL RECORD NO.:  [id number]          PATIENT TYPE:  INP   LOCATION:  6533                         FACILITY:  [facility]   PHYSICIAN:  Corky Crafts, MDDATE OF BIRTH:  09/11/1919   DATE OF ADMISSION:  08/26/2005  DATE OF DISCHARGE:  08/27/2005                                 DISCHARGE SUMMARY   ADMISSION DIAGNOSIS:  1.  Bilateral renal artery stenosis.  2.  Lower extremity claudication.   DISCHARGE DIAGNOSIS:  1.  Bilateral renal artery stenosis status post lower extremity angiogram      status post left renal artery stent.  2.  Lower extremity claudication, improved.  3.  Restless leg syndrome.  4.  Uncontrolled hypertension.  5.  Hyperlipidemia.  6.  History of noncardiac chest pain, stable during this admission without      angina.  7.  History of tachycardia in the past.  8.  Status post right breast lumpectomy.  9.  Status post cataract surgery.  10. Status post tonsillectomy.  11. Status post appendectomy.   PROCEDURES:  1.  Lower extremity runoff.  2.  Selective renal angiogram.  3.  PTA/stent to the left renal artery.   FINDINGS:  1.  Right renal artery stenosis 50%.  2.  Left renal artery stenosis 80% with 90 mmHg gradient.   Right leg:  1.  Right iliac system appears patent.  2.  Right SFA moderately diseased diffusely.  3.  Patent right profunda.  4.  A 90% stenosis of the right TP trunk.  5.  Patent anterior tibial.   Left leg:  1.  Widely patent iliac.  2.  Left SFA occluded at origin; reconstitutes in mid thigh.  3.  Left AT widely patent.  4.  Left TP trunk appears patent, only DP seen well from that point.  5.  Successful PTA/stent placed to the left renal artery with a 4 x 15 mm      Genesis stent.  6.  Occluded left SFA with collaterals via profunda.  7.  Moderate athero right SFA and significant lesions.  8.  Significant disease at the right TP trunk below the knee.   HOSPITAL  COURSE:  Tonya Farley is an 77 year old female with a history of  uncontrolled hypertension and lower extremity claudication secondary to  uncontrolled hypertension.  She underwent a cardiac catheterization in  January and was found to have bilateral renal artery stenosis.  She also  stated that she had transient renal failure after being started on an ACE  inhibitor in the past.  Since that time she has also complained of symptoms  consistent with restless leg syndrome.  She underwent a lower extremity  arterial duplex showing significant SFA disease bilaterally.  She presented  to the [facility] Cardiology office and was referred to Tonya Farley by his  partner, Dr. Corliss Marcus, and was scheduled for an elective lower extremity  angiogram at the [facility] on Monday, August 26, 2005.  Based on the  angiogram, she was found to have 50% right renal artery stenosis and 80%  left renal artery stenosis with 90 mmHg gradient.  A successful PTA stent  was placed to the left renal artery with a 4 x 0 x 15 mm Genesis stent.  The  procedure was tolerated without complications.  She was kept overnight for  observation.  The patient's sheath was pulled after several hours without  complications and her groin was stable without bleeding, bruising or  hematoma.  She is being discharged to home today in stable condition with  the plan to remain on enteric coated aspirin 81 mg daily, indefinitely.  She  will take Plavix 75 mg daily for 30 days.  When she is finished with the  Plavix she will likely start and Pletal and walking will be encouraged.  Her  blood pressure will be followed closely and pending better control of her  blood pressure, consideration of decreasing her blood pressure medication  will be made.  During this admission, the patient was found to be  hypokalemic.  Her potassium was repleted orally this morning and then re-  dosed again 1 hour after the first repletion.  She is being  discharged to  home today in stable condition.   LABORATORY DATA:  Sodium 145, potassium 3.2, chloride 106, CO2 30, BUN 16,  creatinine 1.2, glucose 94, white blood count 10.6, hematocrit 11.3,  hemoglobin 31.5, platelet 320.   X-RAYS:  None during this admission.   EKG:  None during this admission.   CONDITION UPON DISCHARGE:  [name] is being discharged to home today  in stable condition.  She is ambulated in the hallway without any complaints  of angina, shortness of breath, palpitations, claudication, or groin pain.   DISCHARGE MEDICATIONS:  1.  Plavix 75 mg for 30 days.  This represents a new prescription.  A      prescription was given without refills.  2.  Enteric coated aspirin 81 mg daily.  3.  Triamterene/HCTZ 70/50 mg daily.  4.  Norvasc 10 mg daily.  5.  Crestor 10 mg daily.  6.  Tranxene 7.5 mg as needed.  7.  Centrum multivitamin daily.  8.  Labetalol 50 mg twice daily.  This represents a decrease in the previous      dosage, a prescription was given with refills.   DISCHARGE INSTRUCTIONS:  1.  The patient was instructed to resume her previous low salt diet.  2.  The patient has been instructed to avoid driving for 3 days.  3.  The patient has also been instructed to avoid lifting greater than 10      pounds for 1 week.   FOLLOWUP ARRANGEMENTS:  A followup appointment has been scheduled with Dr.  Eldridge Dace for September 09, 2005, at 9:30 a.m. in the [facility] Cardiology office.  The patient is scheduled to call [phone] if she is unable to make the  scheduled appointment.  She will also followup with Dr. Amil Amen.      [address] [place], [place]      Corky Crafts, MD  Electronically Signed    RDM/MEDQ  D:  08/27/2005  T:  08/27/2005  Job:  [id number]   cc:   Antony Madura, M.D.  Fax: [phone]   Corky Crafts, MD  Fax: [phone]

## 2010-10-09 ENCOUNTER — Other Ambulatory Visit: Payer: Self-pay | Admitting: Internal Medicine

## 2010-10-09 ENCOUNTER — Ambulatory Visit
Admission: RE | Admit: 2010-10-09 | Discharge: 2010-10-09 | Disposition: A | Discharge status: Home / Self Care | Payer: Medicare Other | Source: Ambulatory Visit | Attending: Internal Medicine | Admitting: Internal Medicine

## 2010-10-09 DIAGNOSIS — Z1239 Encounter for other screening for malignant neoplasm of breast: Secondary | ICD-10-CM

## 2011-01-10 LAB — PROTIME-INR
INR: 2.9 — ABNORMAL HIGH
Prothrombin Time: 31.8 — ABNORMAL HIGH

## 2012-06-11 ENCOUNTER — Encounter (HOSPITAL_COMMUNITY): Payer: Self-pay | Admitting: Emergency Medicine

## 2012-06-11 ENCOUNTER — Emergency Department (HOSPITAL_COMMUNITY)
Admission: EM | Admit: 2012-06-11 | Discharge: 2012-06-11 | Disposition: A | Discharge status: Home / Self Care | Payer: Medicare Other | Attending: Emergency Medicine | Admitting: Emergency Medicine

## 2012-06-11 ENCOUNTER — Emergency Department (HOSPITAL_COMMUNITY): Payer: Medicare Other

## 2012-06-11 DIAGNOSIS — M545 Low back pain, unspecified: Secondary | ICD-10-CM | POA: Insufficient documentation

## 2012-06-11 DIAGNOSIS — M5380 Other specified dorsopathies, site unspecified: Secondary | ICD-10-CM | POA: Insufficient documentation

## 2012-06-11 DIAGNOSIS — M549 Dorsalgia, unspecified: Secondary | ICD-10-CM

## 2012-06-11 DIAGNOSIS — E785 Hyperlipidemia, unspecified: Secondary | ICD-10-CM | POA: Insufficient documentation

## 2012-06-11 DIAGNOSIS — Y9389 Activity, other specified: Secondary | ICD-10-CM | POA: Insufficient documentation

## 2012-06-11 DIAGNOSIS — I10 Essential (primary) hypertension: Secondary | ICD-10-CM | POA: Insufficient documentation

## 2012-06-11 DIAGNOSIS — M4856XA Collapsed vertebra, not elsewhere classified, lumbar region, initial encounter for fracture: Secondary | ICD-10-CM

## 2012-06-11 DIAGNOSIS — Z7901 Long term (current) use of anticoagulants: Secondary | ICD-10-CM | POA: Insufficient documentation

## 2012-06-11 DIAGNOSIS — Y92009 Unspecified place in unspecified non-institutional (private) residence as the place of occurrence of the external cause: Secondary | ICD-10-CM | POA: Insufficient documentation

## 2012-06-11 DIAGNOSIS — W07XXXA Fall from chair, initial encounter: Secondary | ICD-10-CM | POA: Insufficient documentation

## 2012-06-11 DIAGNOSIS — M62830 Muscle spasm of back: Secondary | ICD-10-CM

## 2012-06-11 DIAGNOSIS — Z79899 Other long term (current) drug therapy: Secondary | ICD-10-CM | POA: Insufficient documentation

## 2012-06-11 DIAGNOSIS — W010XXA Fall on same level from slipping, tripping and stumbling without subsequent striking against object, initial encounter: Secondary | ICD-10-CM | POA: Insufficient documentation

## 2012-06-11 DIAGNOSIS — E039 Hypothyroidism, unspecified: Secondary | ICD-10-CM | POA: Insufficient documentation

## 2012-06-11 DIAGNOSIS — S32009A Unspecified fracture of unspecified lumbar vertebra, initial encounter for closed fracture: Secondary | ICD-10-CM | POA: Insufficient documentation

## 2012-06-11 HISTORY — DX: Hypothyroidism, unspecified: E03.9

## 2012-06-11 HISTORY — DX: Essential (primary) hypertension: I10

## 2012-06-11 HISTORY — DX: Disorder of thyroid, unspecified: E07.9

## 2012-06-11 HISTORY — DX: Hyperlipidemia, unspecified: E78.5

## 2012-06-11 MED ORDER — DIAZEPAM 2 MG PO TABS: 2.0000 mg | ORAL_TABLET | Freq: Three times a day (TID) | ORAL | Status: DC | PRN

## 2012-06-11 MED ORDER — HYDROCODONE-ACETAMINOPHEN 5-325 MG PO TABS
1.0000 | ORAL_TABLET | Freq: Once | ORAL | Status: AC
  Administered 2012-06-11: 1 via ORAL
  Filled 2012-06-11: qty 1

## 2012-06-11 MED ORDER — HYDROCODONE-ACETAMINOPHEN 5-325 MG PO TABS: 1.0000 | ORAL_TABLET | Freq: Once | ORAL | Status: DC

## 2012-06-11 MED ORDER — DIAZEPAM 2 MG PO TABS
2.0000 mg | ORAL_TABLET | Freq: Once | ORAL | Status: AC
  Administered 2012-06-11: 2 mg via ORAL
  Filled 2012-06-11: qty 1

## 2012-06-11 NOTE — ED Notes (Signed)
Brought in by EMS from home with c/o "lower back spasms" causing severe pain, onset "40 hours ago".  Per EMS, pt denied any fall or trauma; pt was ambulating when she felt "sudden spasm" to her lower back that comes and goes; this morning, she slid off her recliner chair and "spasms and lower back pain got worse"--- pt decided to be evaluated here in ED.

## 2012-06-11 NOTE — ED Provider Notes (Signed)
History     CSN: [id number]  Arrival date & time 06/11/12  0304   First MD Initiated Contact with Patient 06/11/12 0303      Chief Complaint  Patient presents with  . Back Pain    (Consider location/radiation/quality/duration/timing/severity/associated sxs/prior treatment) HPI 79 year old female presents from home via EMS with complaint of low back pain.  Patient reports onset of pain yesterday, steadily worsening.  No trauma.  Patient reports she attempted to get out of a chair around 1 AM, and due to pain was unable to stand.  She slid out of the chair and landed on her bottom.  She was able to get to a phone, and contacted her neighbors, who picked her up.  She denies any incontinence.  No weakness or numbness of the legs.  No prior history of back problems.  Pain extends across entire lower back.  Pain is dull and has intermittent sharp "grabbing sensation".  No fevers.  Past Medical History  Diagnosis Date  . Hypertension   . Hyperlipidemia   . Thyroid disease   . Hypothyroidism     History reviewed. No pertinent past surgical history.  History reviewed. No pertinent family history.  History  Substance Use Topics  . Smoking status: Never Smoker   . Smokeless tobacco: Not on file  . Alcohol Use: No    OB History   Grav Para Term Preterm Abortions TAB SAB Ect Mult Living                  Review of Systems  All other systems reviewed and are negative.    Allergies  Penicillins and Sulfa antibiotics  Home Medications   Current Outpatient Rx  Name  Route  Sig  Dispense  Refill  . atorvastatin (LIPITOR) 10 MG tablet   Oral   Take 10 mg by mouth daily.         . B Complex-C (B-COMPLEX WITH VITAMIN C) tablet   Oral   Take 1 tablet by mouth daily.         Marland Kitchen diltiazem (CARDIZEM CD) 300 MG 24 hr capsule   Oral   Take 300 mg by mouth daily.         . furosemide (LASIX) 40 MG tablet   Oral   Take 40 mg by mouth daily.          Marland Kitchen labetalol  (NORMODYNE) 200 MG tablet   Oral   Take 200 mg by mouth 2 (two) times daily.         Marland Kitchen levothyroxine (SYNTHROID, LEVOTHROID) 100 MCG tablet   Oral   Take 100 mcg by mouth daily.         . potassium chloride SA (K-DUR,KLOR-CON) 20 MEQ tablet   Oral   Take 20 mEq by mouth daily.         Marland Kitchen warfarin (COUMADIN) 1 MG tablet   Oral   Take 2-3 mg by mouth daily. 3 mg Mondays  2mg  all other days           BP 141/79  Pulse 97  Temp(Src) 98.8 F (37.1 C) (Oral)  Resp 18  SpO2 97%  Physical Exam  Nursing note and vitals reviewed. Constitutional: She is oriented to person, place, and time. She appears well-developed and well-nourished. No distress.  HENT:  Head: Normocephalic and atraumatic.  Nose: Nose normal.  Mouth/Throat: Oropharynx is clear and moist.  Eyes: Conjunctivae and EOM are normal. Pupils are equal, round, and reactive  to light.  Neck: Normal range of motion. Neck supple. No JVD present. No tracheal deviation present. No thyromegaly present.  Pulmonary/Chest: Effort normal and breath sounds normal. No stridor. No respiratory distress. She has no wheezes. She has no rales. She exhibits no tenderness.  Abdominal: Soft. Bowel sounds are normal. She exhibits no distension and no mass. There is no tenderness. There is no rebound and no guarding.  Musculoskeletal: Normal range of motion. She exhibits tenderness. She exhibits no edema.  TTP across lower back and worse at left SI joint, no stepoff or crepitus, no midline tenderness, more in paraspinal muscles with palpation.  Lymphadenopathy:    She has no cervical adenopathy.  Neurological: She is alert and oriented to person, place, and time. She displays normal reflexes. She exhibits normal muscle tone. Coordination normal.  SLR on right causes pain  Skin: Skin is warm and dry. No rash noted. No erythema. No pallor.    ED Course  Procedures (including critical care time)  Labs Reviewed - No data to display Dg  Lumbar Spine Complete  06/11/2012  *RADIOLOGY REPORT*  Clinical Data: Low back pain for 3 days.  LUMBAR SPINE - COMPLETE 4+ VIEW  Comparison: None.  Findings: Five lumbar type vertebrae with partial sacralization of L5.  Compression fractures of L3 and L1 vertebra.  There are associated degenerative changes suggesting these are likely old. No prior comparison studies are available to correlate.  Changes are likely due to osteoporosis.  Normal alignment of the lumbar vertebrae.  Degenerative changes in the facet joints.  No focal bone lesion or bone destruction.  Vascular stent in the region of the left renal artery.  IMPRESSION: Diffuse degenerative changes in the lumbar spine.  Diffuse bone demineralization with compression fractures of L3 and L1 vertebra. Changes consistent with osteoporosis.  Fractures are of indeterminate age but associated end plate degenerative changes suggest chronic fractures.   Original Report Authenticated By: Burman Nieves, M.D.      1. Back pain   2. Muscle spasm of back   3. Compression fracture of lumbar spine, non-traumatic, initial encounter       MDM  79 year old female with lower back pain.  No red flags on history or physical.  Tenderness over paraspinal muscles.  X-ray show compression fractures of L1 and L3, most likely chronic.  Will treat for pain and spasm and have her followup with her primary care Dr.  4:57 AM Compression fractures on xray, but no point tenderness at these areas,suspect old/chronic.  Pt updated on findings.  She is feeling better after medications.          Olivia Mackie, MD 06/11/12 [phone]

## 2012-06-11 NOTE — ED Notes (Signed)
[id number]<AV> Expected date:<BR> Expected time:<BR> Means of arrival:<BR> Comments:<BR> EMS/79 yo with back spasms for 36 hours

## 2012-06-15 ENCOUNTER — Encounter (HOSPITAL_COMMUNITY): Payer: Self-pay | Admitting: Emergency Medicine

## 2012-06-15 ENCOUNTER — Emergency Department (HOSPITAL_COMMUNITY)
Admission: EM | Admit: 2012-06-15 | Discharge: 2012-06-15 | Disposition: A | Discharge status: Home / Self Care | Payer: Medicare Other | Attending: Emergency Medicine | Admitting: Emergency Medicine

## 2012-06-15 DIAGNOSIS — M545 Low back pain, unspecified: Secondary | ICD-10-CM | POA: Insufficient documentation

## 2012-06-15 DIAGNOSIS — R5383 Other fatigue: Secondary | ICD-10-CM | POA: Insufficient documentation

## 2012-06-15 DIAGNOSIS — R5381 Other malaise: Secondary | ICD-10-CM | POA: Insufficient documentation

## 2012-06-15 DIAGNOSIS — Z7901 Long term (current) use of anticoagulants: Secondary | ICD-10-CM | POA: Insufficient documentation

## 2012-06-15 DIAGNOSIS — M549 Dorsalgia, unspecified: Secondary | ICD-10-CM

## 2012-06-15 DIAGNOSIS — E785 Hyperlipidemia, unspecified: Secondary | ICD-10-CM | POA: Insufficient documentation

## 2012-06-15 DIAGNOSIS — Z79899 Other long term (current) drug therapy: Secondary | ICD-10-CM | POA: Insufficient documentation

## 2012-06-15 DIAGNOSIS — E079 Disorder of thyroid, unspecified: Secondary | ICD-10-CM | POA: Insufficient documentation

## 2012-06-15 DIAGNOSIS — G8929 Other chronic pain: Secondary | ICD-10-CM | POA: Insufficient documentation

## 2012-06-15 DIAGNOSIS — I10 Essential (primary) hypertension: Secondary | ICD-10-CM | POA: Insufficient documentation

## 2012-06-15 HISTORY — DX: Dorsalgia, unspecified: M54.9

## 2012-06-15 HISTORY — DX: Other chronic pain: G89.29

## 2012-06-15 LAB — URINALYSIS, ROUTINE W REFLEX MICROSCOPIC
Leukocytes, UA: NEGATIVE
Nitrite: NEGATIVE
Specific Gravity, Urine: 1.022 (ref 1.005–1.030)
Urobilinogen, UA: 1 mg/dL (ref 0.0–1.0)

## 2012-06-15 MED ORDER — TRAMADOL HCL 50 MG PO TABS
50.0000 mg | ORAL_TABLET | Freq: Once | ORAL | Status: AC
  Administered 2012-06-15: 50 mg via ORAL
  Filled 2012-06-15: qty 1

## 2012-06-15 MED ORDER — TRAMADOL HCL 50 MG PO TABS: 50.0000 mg | ORAL_TABLET | Freq: Four times a day (QID) | ORAL | Status: DC | PRN

## 2012-06-15 NOTE — ED Notes (Addendum)
Niece in waiting room is CRNA unhappy that hospital is on lockdown.  Demanding to be consulted on pt assessment.  Pt seems alert oriented X4 and is answering all RN questions approprioatly.  Pt states she is weakened from back pain but is able to walk at home with walker.  RN assisted patient to restroom and pt was able to walk with some pain.  Pt stated she felt slightly weakened by from the pain.

## 2012-06-15 NOTE — ED Notes (Signed)
Niece called back from Nurse 1st angry that she could not come and sit with patient.  Emergency room on lock down.   Niece asked to speak with PA - Dahlia Client, PA took call.

## 2012-06-15 NOTE — ED Notes (Signed)
Per EMS: pt from home where she lives alone c/o chronic lower back pain; pt denies fall or other injury; pt seen for same on 3/20 and did not get her rx filled

## 2012-06-15 NOTE — ED Provider Notes (Signed)
History     CSN: [id number]  Arrival date & time 06/15/12  5784   First MD Initiated Contact with Patient 06/15/12 1014      Chief Complaint  Patient presents with  . Back Pain    (Consider location/radiation/quality/duration/timing/severity/associated sxs/prior treatment) HPI Comments: [name] who presents with low back pain. This pain is chronic. She was recently seen by Dr. Norlene Campbell in the ED at [facility] where she received a prescription for Percocet and Flexeril which she did not fill because she stated they were too strong and she had plenty of Percocet. Today, she comes back after experiencing excruciating pain this morning. It is sharp and just "grabs her". The pain is non radiating. It is bilateral. She denies any trauma. No bowel or bladder incontinence. No headache, fever, abdominal pian, nausea, vomiting, shortness of breath or chest pain.  [name]. The history is provided by the patient. No language interpreter was used.  Back Pain Location:  Lumbar spine Quality:  Stabbing Radiates to:  Does not radiate Pain severity now: at this time in no pain, this morning pain was severe. Onset quality:  Sudden Duration:  2 hours (since acute event, pain is chronic) Timing:  Intermittent Progression:  Worsening Chronicity:  Chronic Context: not falling, not lifting heavy objects, not recent illness and not recent injury   Relieved by:  Nothing Ineffective treatments:  Narcotics (only takes 1/4 of a percocet) Associated symptoms: weakness (generalized)   Associated symptoms: no abdominal pain, no bladder incontinence, no bowel incontinence, no chest pain, no fever, no headaches, no numbness, no paresthesias and no tingling   Weakness:    Severity:  Mild   Onset quality:  Gradual   Chronicity:  New   Past Medical History  Diagnosis Date  . Hypertension   . Hyperlipidemia   . Thyroid disease   . Hypothyroidism    . Chronic back pain     History reviewed. No pertinent past surgical history.  History reviewed. No pertinent family history.  History  Substance Use Topics  . Smoking status: Never Smoker   . Smokeless tobacco: Not on file  . Alcohol Use: No    OB History   Grav Para Term Preterm Abortions TAB SAB Ect Mult Living                  Review of Systems  Constitutional: Negative for fever.  Cardiovascular: Negative for chest pain.  Gastrointestinal: Negative for abdominal pain and bowel incontinence.  Genitourinary: Negative for bladder incontinence.  Musculoskeletal: Positive for back pain.  Neurological: Positive for weakness (generalized). Negative for tingling, numbness, headaches and paresthesias.  All other systems reviewed and are negative.    Allergies  Penicillins and Sulfa antibiotics  Home Medications   Current Outpatient Rx  Name  Route  Sig  Dispense  Refill  . atorvastatin (LIPITOR) 10 MG tablet   Oral   Take 10 mg by mouth daily.         . B Complex-C (B-COMPLEX WITH VITAMIN C) tablet   Oral   Take 1 tablet by mouth daily.         . diazepam (VALIUM) 2 MG tablet   Oral   Take 1 tablet (2 mg total) by mouth every 8 (eight) hours as needed (muscle spasms or cramping).   20 tablet   0   . diltiazem (CARDIZEM CD) 300 MG 24 hr  capsule   Oral   Take 300 mg by mouth daily.         . furosemide (LASIX) 40 MG tablet   Oral   Take 40 mg by mouth daily.          Marland Kitchen HYDROcodone-acetaminophen (NORCO/VICODIN) 5-325 MG per tablet   Oral   Take 0.25-2 tablets by mouth every 4 (four) hours as needed for pain.         Marland Kitchen labetalol (NORMODYNE) 200 MG tablet   Oral   Take 200 mg by mouth 2 (two) times daily.         Marland Kitchen levothyroxine (SYNTHROID, LEVOTHROID) 100 MCG tablet   Oral   Take 100 mcg by mouth daily.         . potassium chloride SA (K-DUR,KLOR-CON) 20 MEQ tablet   Oral   Take 20 mEq by mouth daily.         Marland Kitchen warfarin (COUMADIN)  1 MG tablet   Oral   Take 2-3 mg by mouth daily. 3 mg Mondays  2mg  all other days           There were no vitals taken for this visit.  Physical Exam  Nursing note and vitals reviewed. Constitutional: She is oriented to person, place, and time. Vital signs are normal. She appears well-developed and well-nourished. She is cooperative. She does not have a sickly appearance. No distress.  HENT:  Head: Normocephalic and atraumatic.  Right Ear: External ear normal.  Left Ear: External ear normal.  Nose: Nose normal.  Eyes: Conjunctivae are normal.  Neck: Normal range of motion. No tracheal deviation present.  Cardiovascular: Normal rate, regular rhythm, normal heart sounds and intact distal pulses.  Exam reveals no gallop and no friction rub.   No murmur heard. Pulmonary/Chest: Effort normal and breath sounds normal. No stridor. No respiratory distress. She has no wheezes. She has no rales.  Abdominal: Soft. Bowel sounds are normal. She exhibits no distension. There is no tenderness.  Musculoskeletal: Normal range of motion. She exhibits tenderness.       Lumbar back: She exhibits tenderness. She exhibits no bony tenderness, no swelling and no deformity.  Tender to palpation across lower back No stepoffs, crepitus, bony tenderness Straight leg raise negative bilaterally   Neurological: She is alert and oriented to person, place, and time. She has normal strength and normal reflexes. She displays normal reflexes. No sensory deficit. She exhibits normal muscle tone. Gait normal.  Visualized patient ambulating with assistance from nurse - no gait disturbance   Skin: Skin is warm and dry. She is not diaphoretic.  Psychiatric: She has a normal mood and affect. Her speech is normal and behavior is normal. Judgment and thought content normal. Cognition and memory are normal.    ED Course  Procedures (including critical care time)  Labs Reviewed  URINALYSIS, ROUTINE W REFLEX MICROSCOPIC    No results found.   1. Back pain without radiation       MDM  Patient presents today with low back pain. States this is the same pain that was evaluated 3 days ago. Old compression fx found on XR 3 days ago - no bony tenderness over area of fx. No bony tenderness, no step offs, no crepitus. No bowel or bladder incontinence. Patient ambulates well with assistance. UA WNL. Niece was very concerned patient was demented. Alert and Oriented x 4. Patient's story consistent with chart from previous visit. Referral to neurosurgery per niece's request. Will follow  up with PCP. Return precautions given. Patient agrees with plan. Niece very unhappy with care. Spoke to nurse, but could not articulate what she was unhappy about. ED was on lockdown during this time. Attending saw patient and agrees with plan.     Mora Bellman, PA-C 06/15/12 [id number]

## 2012-06-15 NOTE — ED Notes (Signed)
Pt via EMS for same lower back pain that she was seen for 3/20.  Pt did not get rx filled due to already having pain meds at home.  Pt states pain is sharp grabbing pain when she moves but denies pain currently as she is in "correct position".  Pt alert oriented X4

## 2012-06-15 NOTE — ED Notes (Signed)
Pt sister called and wanted to give history.  Said pt lives alone and what the family is wanting from ED is to make sure she gets follow-up to stop this pain.  Doesn't want to takwe pain meds

## 2012-06-16 NOTE — ED Provider Notes (Signed)
Medical screening examination/treatment/procedure(s) were conducted as a shared visit with non-physician practitioner(s) and myself.  I personally evaluated the patient during the encounter Pt c/o low back pain, lower lumbar/sacral area. Worse w movement/bending. No numbness/weakness. No gi or gu c/o. No fevers. Spine nt.   Suzi Roots, MD 06/16/12 [phone]

## 2012-06-25 ENCOUNTER — Encounter (HOSPITAL_COMMUNITY): Payer: Self-pay | Admitting: Certified Registered Nurse Anesthetist

## 2012-06-25 ENCOUNTER — Emergency Department (HOSPITAL_COMMUNITY): Payer: Medicare Other

## 2012-06-25 ENCOUNTER — Encounter (HOSPITAL_COMMUNITY): Payer: Self-pay | Admitting: Emergency Medicine

## 2012-06-25 ENCOUNTER — Inpatient Hospital Stay (HOSPITAL_COMMUNITY)
Admission: EM | Admit: 2012-06-25 | Discharge: 2012-07-01 | DRG: 516 | Disposition: A | Discharge status: Skilled Nursing Facility | Payer: Medicare Other | Attending: Internal Medicine | Admitting: Internal Medicine

## 2012-06-25 DIAGNOSIS — IMO0001 Reserved for inherently not codable concepts without codable children: Secondary | ICD-10-CM

## 2012-06-25 DIAGNOSIS — G8929 Other chronic pain: Secondary | ICD-10-CM

## 2012-06-25 DIAGNOSIS — D631 Anemia in chronic kidney disease: Secondary | ICD-10-CM | POA: Diagnosis present

## 2012-06-25 DIAGNOSIS — Z66 Do not resuscitate: Secondary | ICD-10-CM | POA: Diagnosis present

## 2012-06-25 DIAGNOSIS — N182 Chronic kidney disease, stage 2 (mild): Secondary | ICD-10-CM | POA: Diagnosis present

## 2012-06-25 DIAGNOSIS — M8448XA Pathological fracture, other site, initial encounter for fracture: Principal | ICD-10-CM | POA: Diagnosis present

## 2012-06-25 DIAGNOSIS — E039 Hypothyroidism, unspecified: Secondary | ICD-10-CM | POA: Diagnosis present

## 2012-06-25 DIAGNOSIS — E785 Hyperlipidemia, unspecified: Secondary | ICD-10-CM | POA: Diagnosis present

## 2012-06-25 DIAGNOSIS — R300 Dysuria: Secondary | ICD-10-CM | POA: Diagnosis present

## 2012-06-25 DIAGNOSIS — M8448XD Pathological fracture, other site, subsequent encounter for fracture with routine healing: Secondary | ICD-10-CM

## 2012-06-25 DIAGNOSIS — I4891 Unspecified atrial fibrillation: Secondary | ICD-10-CM | POA: Diagnosis present

## 2012-06-25 DIAGNOSIS — D72829 Elevated white blood cell count, unspecified: Secondary | ICD-10-CM | POA: Diagnosis present

## 2012-06-25 DIAGNOSIS — H9190 Unspecified hearing loss, unspecified ear: Secondary | ICD-10-CM | POA: Diagnosis present

## 2012-06-25 DIAGNOSIS — I10 Essential (primary) hypertension: Secondary | ICD-10-CM | POA: Diagnosis present

## 2012-06-25 DIAGNOSIS — M810 Age-related osteoporosis without current pathological fracture: Secondary | ICD-10-CM | POA: Diagnosis present

## 2012-06-25 DIAGNOSIS — M51379 Other intervertebral disc degeneration, lumbosacral region without mention of lumbar back pain or lower extremity pain: Secondary | ICD-10-CM | POA: Diagnosis present

## 2012-06-25 DIAGNOSIS — M4856XG Collapsed vertebra, not elsewhere classified, lumbar region, subsequent encounter for fracture with delayed healing: Secondary | ICD-10-CM

## 2012-06-25 DIAGNOSIS — H5440 Blindness, one eye, unspecified eye: Secondary | ICD-10-CM | POA: Diagnosis present

## 2012-06-25 DIAGNOSIS — M5137 Other intervertebral disc degeneration, lumbosacral region: Secondary | ICD-10-CM | POA: Diagnosis present

## 2012-06-25 DIAGNOSIS — Z7901 Long term (current) use of anticoagulants: Secondary | ICD-10-CM

## 2012-06-25 DIAGNOSIS — N179 Acute kidney failure, unspecified: Secondary | ICD-10-CM | POA: Diagnosis not present

## 2012-06-25 DIAGNOSIS — M4856XA Collapsed vertebra, not elsewhere classified, lumbar region, initial encounter for fracture: Secondary | ICD-10-CM

## 2012-06-25 DIAGNOSIS — N039 Chronic nephritic syndrome with unspecified morphologic changes: Secondary | ICD-10-CM | POA: Diagnosis present

## 2012-06-25 DIAGNOSIS — I129 Hypertensive chronic kidney disease with stage 1 through stage 4 chronic kidney disease, or unspecified chronic kidney disease: Secondary | ICD-10-CM | POA: Diagnosis present

## 2012-06-25 HISTORY — DX: Chronic atrial fibrillation, unspecified: I48.20

## 2012-06-25 LAB — POCT I-STAT, CHEM 8
BUN: 22 mg/dL (ref 6–23)
Calcium, Ion: 1.17 mmol/L (ref 1.13–1.30)
Chloride: 107 mEq/L (ref 96–112)
Creatinine, Ser: 1.3 mg/dL — ABNORMAL HIGH (ref 0.50–1.10)
Glucose, Bld: 150 mg/dL — ABNORMAL HIGH (ref 70–99)
HCT: 33 % — ABNORMAL LOW (ref 36.0–46.0)
Hemoglobin: 11.2 g/dL — ABNORMAL LOW (ref 12.0–15.0)
Potassium: 4.2 mEq/L (ref 3.5–5.1)
Sodium: 141 meq/L (ref 135–145)
TCO2: 31 mmol/L (ref 0–100)

## 2012-06-25 LAB — CREATININE, SERUM: GFR calc Af Amer: 46 mL/min — ABNORMAL LOW (ref >90)

## 2012-06-25 LAB — CBC
HCT: 32.4 % — ABNORMAL LOW (ref 36.0–46.0)
MCH: 28.1 pg (ref 26.0–34.0)
MCV: 86.6 fL (ref 78.0–100.0)
Platelets: 343 10*3/uL (ref 150–400)
RBC: 3.74 MIL/uL — ABNORMAL LOW (ref 3.87–5.11)
WBC: 14.3 10*3/uL — ABNORMAL HIGH (ref 4.0–10.5)

## 2012-06-25 LAB — PROTIME-INR: Prothrombin Time: 40.1 seconds — ABNORMAL HIGH (ref 11.6–15.2)

## 2012-06-25 MED ORDER — SODIUM CHLORIDE 0.9 % IJ SOLN: 3.0000 mL | INTRAMUSCULAR | Status: DC | PRN

## 2012-06-25 MED ORDER — ZOLPIDEM TARTRATE 5 MG PO TABS: 5.0000 mg | ORAL_TABLET | Freq: Every evening | ORAL | Status: DC | PRN

## 2012-06-25 MED ORDER — ENOXAPARIN SODIUM 30 MG/0.3ML ~~LOC~~ SOLN: 30.0000 mg | SUBCUTANEOUS | Status: DC

## 2012-06-25 MED ORDER — SODIUM CHLORIDE 0.9 % IV SOLN: 250.0000 mL | INTRAVENOUS | Status: DC | PRN

## 2012-06-25 MED ORDER — POTASSIUM CHLORIDE CRYS ER 20 MEQ PO TBCR
20.0000 meq | EXTENDED_RELEASE_TABLET | Freq: Every day | ORAL | Status: DC
  Administered 2012-06-26 – 2012-07-01 (×5): 20 meq via ORAL
  Filled 2012-06-25 (×6): qty 1

## 2012-06-25 MED ORDER — ONDANSETRON HCL 4 MG PO TABS
4.0000 mg | ORAL_TABLET | Freq: Four times a day (QID) | ORAL | Status: DC | PRN
  Administered 2012-06-29: 4 mg via ORAL
  Filled 2012-06-25: qty 1

## 2012-06-25 MED ORDER — ACETAMINOPHEN 650 MG RE SUPP: 650.0000 mg | Freq: Four times a day (QID) | RECTAL | Status: DC | PRN

## 2012-06-25 MED ORDER — SODIUM CHLORIDE 0.9 % IJ SOLN
3.0000 mL | Freq: Two times a day (BID) | INTRAMUSCULAR | Status: DC
  Administered 2012-06-26 – 2012-06-30 (×3): 3 mL via INTRAVENOUS

## 2012-06-25 MED ORDER — LEVOTHYROXINE SODIUM 100 MCG PO TABS
100.0000 ug | ORAL_TABLET | Freq: Every day | ORAL | Status: DC
  Administered 2012-06-26 – 2012-07-01 (×6): 100 ug via ORAL
  Filled 2012-06-25 (×7): qty 1

## 2012-06-25 MED ORDER — LABETALOL HCL 200 MG PO TABS
200.0000 mg | ORAL_TABLET | Freq: Two times a day (BID) | ORAL | Status: DC
  Administered 2012-06-25 – 2012-07-01 (×10): 200 mg via ORAL
  Filled 2012-06-25 (×14): qty 1

## 2012-06-25 MED ORDER — ONDANSETRON HCL 4 MG/2ML IJ SOLN: 4.0000 mg | Freq: Four times a day (QID) | INTRAMUSCULAR | Status: DC | PRN

## 2012-06-25 MED ORDER — DILTIAZEM HCL ER COATED BEADS 300 MG PO CP24
300.0000 mg | ORAL_CAPSULE | Freq: Every day | ORAL | Status: DC
  Administered 2012-06-26 – 2012-07-01 (×6): 300 mg via ORAL
  Filled 2012-06-25 (×6): qty 1

## 2012-06-25 MED ORDER — DIAZEPAM 2 MG PO TABS: 2.0000 mg | ORAL_TABLET | Freq: Three times a day (TID) | ORAL | Status: DC | PRN

## 2012-06-25 MED ORDER — FUROSEMIDE 40 MG PO TABS
40.0000 mg | ORAL_TABLET | Freq: Every day | ORAL | Status: DC
  Administered 2012-06-26 – 2012-07-01 (×5): 40 mg via ORAL
  Filled 2012-06-25 (×7): qty 1

## 2012-06-25 MED ORDER — B COMPLEX-C PO TABS
1.0000 | ORAL_TABLET | Freq: Every day | ORAL | Status: DC
  Administered 2012-06-26 – 2012-06-28 (×3): 1 via ORAL
  Filled 2012-06-25 (×4): qty 1

## 2012-06-25 MED ORDER — ATORVASTATIN CALCIUM 10 MG PO TABS
10.0000 mg | ORAL_TABLET | Freq: Every day | ORAL | Status: DC
Start: 2012-06-25 — End: 2012-06-29
  Administered 2012-06-25 – 2012-06-28 (×4): 10 mg via ORAL
  Filled 2012-06-25 (×5): qty 1

## 2012-06-25 MED ORDER — FENTANYL CITRATE 0.05 MG/ML IJ SOLN: 25.0000 ug | INTRAMUSCULAR | Status: DC | PRN

## 2012-06-25 MED ORDER — TRAMADOL HCL 50 MG PO TABS
50.0000 mg | ORAL_TABLET | Freq: Four times a day (QID) | ORAL | Status: DC | PRN
  Administered 2012-06-26: 50 mg via ORAL
  Filled 2012-06-25: qty 1

## 2012-06-25 MED ORDER — FENTANYL CITRATE 0.05 MG/ML IJ SOLN
50.0000 ug | Freq: Once | INTRAMUSCULAR | Status: AC
  Administered 2012-06-25: 50 ug via INTRAVENOUS
  Filled 2012-06-25: qty 2

## 2012-06-25 MED ORDER — ACETAMINOPHEN 325 MG PO TABS: 650.0000 mg | ORAL_TABLET | Freq: Four times a day (QID) | ORAL | Status: DC | PRN

## 2012-06-25 MED ORDER — ONDANSETRON HCL 4 MG/2ML IJ SOLN
4.0000 mg | Freq: Once | INTRAMUSCULAR | Status: AC
  Administered 2012-06-25: 4 mg via INTRAVENOUS
  Filled 2012-06-25: qty 2

## 2012-06-25 NOTE — ED Notes (Signed)
In and out catheter inserted, no urine output.

## 2012-06-25 NOTE — ED Notes (Signed)
Pt states she has been unable to walk due to lower back pain. Pt able to move all extremities. Pt a/o x 4.

## 2012-06-25 NOTE — ED Notes (Signed)
Put pt on bedpan. Pt was unable to void.

## 2012-06-25 NOTE — ED Notes (Signed)
Pt had decrease in O2 after receiving fentanyl put on 2L via nasal cannula.

## 2012-06-25 NOTE — ED Provider Notes (Signed)
History     CSN: [id number]  Arrival date & time 06/25/12  1509   First MD Initiated Contact with Patient 06/25/12 1522      Chief Complaint  Patient presents with  . Back Pain    (Consider location/radiation/quality/duration/timing/severity/associated sxs/prior treatment) HPI Comments: Pt with 3rd visit to the ED due to worsening low back pain over 2-3 week period.  No fall or trauma, pt just had a sudden pain in low back, worse with palpation, movement of back.  Pt has had plain films showing no acute previously.  Pt iniitally was given pain prescription, was too strong for her, on 2nd visit was given tramadol instead which was helping.  She saw Dr. Newell Coral 2 days ago who did some other films with her bent over and he discussed that she had a lot of degeneration, and told her to go home and rest.  However in the past 2 days, pain has gotten worse, harder to control even after pain meds.  Just took the tramadol prior to arrival, hasn't been able to get up out of bed due to pain in past 1.5 days.  Brought by family after they called PMD and reported worse pain and not able to stand and walk.  Pt lives independently, own home, stairs to the house, none in the house.  No urinary difficulty, no numbness or distal weakness.  No fevers, chills, no abd pain.    [name]. The history is provided by the patient, a relative and medical records.  Back Pain Associated symptoms: no abdominal pain, no fever, no numbness, no pelvic pain and no weakness     Past Medical History  Diagnosis Date  . Hypertension   . Hyperlipidemia   . Thyroid disease   . Hypothyroidism   . Chronic back pain   . PONV (postoperative nausea and vomiting)   . Chronic a-fib     Past Surgical History  Procedure Laterality Date  . Appendectomy    . Tonsillectomy    . Cataracts      bilateral  . Eye surgery      left eye "hole"    History reviewed. No pertinent family  history.  History  Substance Use Topics  . Smoking status: Never Smoker   . Smokeless tobacco: Never Used  . Alcohol Use: No    OB History   Grav Para Term Preterm Abortions TAB SAB Ect Mult Living                  Review of Systems  Constitutional: Negative for fever and chills.  Gastrointestinal: Negative for nausea, vomiting and abdominal pain.  Genitourinary: Negative for frequency, flank pain, difficulty urinating and pelvic pain.  Musculoskeletal: Positive for back pain.  Skin: Negative for color change and rash.  Neurological: Negative for weakness and numbness.  All other systems reviewed and are negative.    Allergies  Penicillins and Sulfa antibiotics  Home Medications   No current outpatient prescriptions on file.  BP 120/71  Pulse 82  Temp(Src) 98 F (36.7 C) (Oral)  Resp 16  Ht 5\' 4"  (1.626 m)  Wt 158 lb 3.2 oz (71.759 kg)  BMI 27.14 kg/m2  SpO2 93%  Physical Exam  Nursing note and vitals reviewed. Constitutional: She appears well-developed and well-nourished. No distress.  HENT:  Head: Normocephalic and atraumatic.  Eyes: EOM are normal. No scleral icterus.  Neck: Normal range of motion. Neck supple.  Cardiovascular: Normal rate and regular rhythm.   Pulmonary/Chest: Effort normal.  Abdominal: Soft. She exhibits no distension. There is no tenderness.  Musculoskeletal: She exhibits no edema and no tenderness.       Right hip: She exhibits normal range of motion and no tenderness.       Left hip: She exhibits normal range of motion and no tenderness.       Cervical back: She exhibits normal range of motion, no tenderness and no bony tenderness.       Thoracic back: She exhibits tenderness and bony tenderness. She exhibits no deformity, no laceration, no pain, no spasm and normal pulse.       Lumbar back: She exhibits tenderness and bony tenderness. She exhibits no deformity, no laceration, no spasm and normal pulse.       Back:  Straight leg  raise test is +  For pain, not numbness or weakness, painful with raising right leg to 20 degrees off of bed, passively, adn left leg about 30 degrees.  Neurological: She is alert. She displays normal reflexes. No sensory deficit. She exhibits normal muscle tone.  Reflex Scores:      Patellar reflexes are 1+ on the right side and 1+ on the left side. Skin: Skin is warm.    ED Course  Procedures (including critical care time)  Labs Reviewed  URINALYSIS, ROUTINE W REFLEX MICROSCOPIC - Abnormal; Notable for the following:    APPearance CLOUDY (*)    Bilirubin Urine SMALL (*)    Urobilinogen, UA 4.0 (*)    Leukocytes, UA SMALL (*)    All other components within normal limits  CBC - Abnormal; Notable for the following:    WBC 14.3 (*)    RBC 3.74 (*)    Hemoglobin 10.5 (*)    HCT 32.4 (*)    RDW 15.6 (*)    All other components within normal limits  CREATININE, SERUM - Abnormal; Notable for the following:    Creatinine, Ser 1.18 (*)    GFR calc non Af Amer 39 (*)    GFR calc Af Amer 46 (*)    All other components within normal limits  PROTIME-INR - Abnormal; Notable for the following:    Prothrombin Time 40.1 (*)    INR 4.53 (*)    All other components within normal limits  PROTIME-INR - Abnormal; Notable for the following:    Prothrombin Time 36.6 (*)    INR 4.00 (*)    All other components within normal limits  POCT I-STAT, CHEM 8 - Abnormal; Notable for the following:    Creatinine, Ser 1.30 (*)    Glucose, Bld 150 (*)    Hemoglobin 11.2 (*)    HCT 33.0 (*)    All other components within normal limits  URINE MICROSCOPIC-ADD ON  PROTIME-INR  CBC  BASIC METABOLIC PANEL   Ct Lumbar Spine Wo Contrast  06/25/2012  *RADIOLOGY REPORT*  Clinical Data: Back pain.  Difficulty with ambulation.  CT LUMBAR SPINE WITHOUT CONTRAST  Technique:  Multidetector CT imaging of the lumbar spine was performed without intravenous contrast administration. Multiplanar CT image reconstructions  were also generated.  Comparison: Plain films from Dr. Antony Contras office 06/23/2012. Also lumbar spine films 06/11/2012, [facility]  Findings: The patient has  acute compression fractures of L1 and L3.  At L1 there is superior and inferior endplate disruption with vacuum phenomenon within the vertebral body, suggesting osteonecrosis.  Slight retropulsion of posterior superior endplate at  L1 of 3 mm.  Less than 50% vertebral body height loss L1 anteriorly.  At L3 there is primarily superior endplate depression.  There is loss of approximately 25-30% vertebral body height.  Calcified protrusion posteriorly in the midline at L2-L3. No significant bony retropulsion.  Slight superior endplate discontinuity at L4 could represent a third compression fracture.  It is difficult to assess whether this has changed from previous plain films.  No significant stenosis or nerve root encroachment at L1-L2. Moderate stenosis at L2-3 relates to posterior protrusion calcific disc material and facet arthropathy.  Central protrusion at L3-4 results in apparent spinal stenosis. Advanced disc space narrowing L4-L5. At L5-S1, there is 3-4 mm of facet mediated anterolisthesis. Bilateral neural foraminal narrowing is present at this level, multifactorial.  Multiple other areas of facet arthropathy is seen without facet mediated slip.  Vascular calcifications in the aorta is seen without aneurysmal dilatation.  IMPRESSION: Acute compression fractures of L1 and L3.  These have increased from 06/11/2012 but appear fairly similar to plain films of 06/23/2012. In the appropriate clinical setting, vertebral augmentation could be helpful in alleviating pain and increasing mobility.  An additional slight superior endplate deformity at L4 is not excluded without significant wedging.  Degenerative disc disease as described throughout the lumbar spine.   Original Report Authenticated By: Davonna Belling, M.D.      1. Vertebral compression  fracture, with delayed healing, subsequent encounter   2.   3.   4.   5.   6.   7.     ra sat is 97% and I interpret to be normal    Pt's CT shows acute compression fractures, not improving with conservative treatment at home. 3 visits to the ED in 3 weeks for same.  Now unable to move without considerable pain, unable to perform ADL's at home and family is not able to assist pt 24 hours per day.  Vertebroplasty may be an option, discussed with pt and family at length, decision made to admit for procedures if it is indicated upon further assessment. I spoke to [facility] who will see pt and admilt  MDM  I reviewed prior plain films and ED notes.  Visit to Dr. Newell Coral not available to review.  Will get CT scan, UA and consider admission if unable to ambulate. CT to r/o occult compression fracture.  Intact distal LE strength plantar and dorsi flexion.          Gavin Pound. Oletta Lamas, MD 06/26/12 2205

## 2012-06-25 NOTE — ED Notes (Signed)
Pt states she is feeling nauseous. MD aware. Further orders received.

## 2012-06-25 NOTE — ED Notes (Signed)
Pt presenting to ed with c/o low back pain that's causing her difficulty with walking. Pt states she is having difficulty getting up and down. Pt states she was recently discharge from the hospital but she was able to ambulate when she was discharged

## 2012-06-25 NOTE — ED Notes (Signed)
MD at bedside. Hospitalist at bedside. 

## 2012-06-25 NOTE — ED Notes (Signed)
Patient transported to CT 

## 2012-06-25 NOTE — H&P (Addendum)
[facility]                                                                                    Patient Demographics  Tonya Farley, is a 79 y.o. female  CSN: [id number]  MRN: [id number]  DOB - 03-14-1920  Admit Date - 06/25/2012  Outpatient Primary MD for the patient is ROBERTS, Vernie Ammons, MD   With History of -  Past Medical History  Diagnosis Date  . Hypertension   . Hyperlipidemia   . Thyroid disease   . Hypothyroidism   . Chronic back pain   . PONV (postoperative nausea and vomiting)       Past Surgical History  Procedure Laterality Date  . Appendectomy    . Tonsillectomy    . Cataracts      bilateral  . Eye surgery      left eye "hole"    in for   Chief Complaint  Patient presents with  . Back Pain     HPI  Tonya Farley  is a 79 y.o. female, presenting today with back pain that started around 2 weeks ago on March 19 after baking a cake , she visited the ER 3 times until now , and switched from hydrocodone to Ultram . However patient is incapacitated by mouth and cannot take care of herself. She followed with Dr. Helene Kelp as outpatient, however the pain worsened ; no urinary or stool incontinence. No tingling or numbness of her lower extremities reported. X-ray done on 3/20 showed compression fractures from L1-L3 and a CT scan done today confirmed these findings.    Review of Systems    In addition to the HPI above,  No Fever-chills, No Headache, No changes with Vision or hearing, No problems swallowing food or Liquids, No Chest pain, Cough or Shortness of Breath, No Abdominal pain, No Nausea or Vommitting, Bowel movements are regular, No Blood in stool or Urine, No dysuria, No new skin rashes or bruises, No new joints pains-aches,  No new weakness, tingling, numbness in any extremity, No recent weight gain or loss, No polyuria, polydypsia or polyphagia, No significant Mental Stressors.  A full 10 point Review of Systems was  done, except as stated above, all other Review of Systems were negative.   Social History History  Substance Use Topics  . Smoking status: Never Smoker   . Smokeless tobacco: Never Used  . Alcohol Use: No     Family History significant for hypertension. Father died of a massive attack, mother died of vaginal cancer   Prior to Admission medications   Medication Sig Start Date End Date Taking? Authorizing Provider  atorvastatin (LIPITOR) 10 MG tablet Take 10 mg by mouth daily.   Yes Historical Provider, MD  B Complex-C (B-COMPLEX WITH VITAMIN C) tablet Take 1 tablet by mouth daily.   Yes Historical Provider, MD  diazepam (VALIUM) 2 MG tablet Take 1 tablet (2 mg total) by mouth every 8 (eight) hours as needed (muscle spasms or cramping). 06/11/12  Yes Olivia Mackie, MD  diltiazem (CARDIZEM CD) 300 MG 24 hr capsule Take 300 mg by mouth daily.   Yes Historical Provider,  MD  furosemide (LASIX) 40 MG tablet Take 40 mg by mouth daily.    Yes Historical Provider, MD  labetalol (NORMODYNE) 200 MG tablet Take 200 mg by mouth 2 (two) times daily.   Yes Historical Provider, MD  levothyroxine (SYNTHROID, LEVOTHROID) 100 MCG tablet Take 100 mcg by mouth daily.   Yes Historical Provider, MD  potassium chloride SA (K-DUR,KLOR-CON) 20 MEQ tablet Take 20 mEq by mouth daily.   Yes Historical Provider, MD  traMADol (ULTRAM) 50 MG tablet Take 1 tablet (50 mg total) by mouth every 6 (six) hours as needed for pain. 06/15/12  Yes Mora Bellman, PA-C  warfarin (COUMADIN) 1 MG tablet Take 2-3 mg by mouth daily. 3 mg Mondays  2mg  all other days   Yes Historical Provider, MD    Allergies  Allergen Reactions  . Penicillins Anaphylaxis and Swelling  . Sulfa Antibiotics Anaphylaxis and Swelling    Physical Exam  Vitals  Blood pressure 134/51, pulse 77, temperature 98.6 F (37 C), temperature source Oral, resp. rate 15, SpO2 92.00%.   1. General elderly white female lying in bed in pain, hard of hearing ,  family at bedside  2. Normal affect and insight, Not Suicidal or Homicidal, Awake Alert, slightly confused with pain medications.  3. No F.N deficits, ALL C.Nerves Intact, Strength 5/5 all 4 extremities, Sensation intact all 4 extremities, Plantars down going.  4. Ears and Eyes appear Normal, Conjunctivae clear, PERRLA. Moist Oral Mucosa.  5. Supple Neck, No JVD, No cervical lymphadenopathy appriciated, No Carotid Bruits.  6. Symmetrical Chest wall movement, Good air movement bilaterally, CTAB.  7. irregularly irregular, No Gallops, Rubs or Murmurs, No Parasternal Heave.  8. Positive Bowel Sounds, Abdomen Soft, Non tender, No organomegaly appriciated,No rebound -guarding or rigidity.  9.  No Cyanosis, Normal Skin Turgor, No Skin Rash or Bruise.  10. Good muscle tone,  joints appear normal , no effusions, Normal ROM.  11. No Palpable Lymph Nodes in Neck or Axillae  12. Left spinal tenderness at level of L1 -3  Data Review  CBC  Recent Labs Lab 06/25/12 1752  HGB 11.2*  HCT 33.0*   ------------------------------------------------------------------------------------------------------------------  Chemistries   Recent Labs Lab 06/25/12 1752  NA 141  K 4.2  CL 107  GLUCOSE 150*  BUN 22  CREATININE 1.30*   ------------------------------------------------------------------------------------------------------------------    ----------------------------------------------------------------------------------------------------------------    Imaging results:   Dg Lumbar Spine Complete  06/11/2012  *RADIOLOGY REPORT*  Clinical Data: Low back pain for 3 days.  LUMBAR SPINE - COMPLETE 4+ VIEW  Comparison: None.  Findings: Five lumbar type vertebrae with partial sacralization of L5.  Compression fractures of L3 and L1 vertebra.  There are associated degenerative changes suggesting these are likely old. No prior comparison studies are available to correlate.  Changes are  likely due to osteoporosis.  Normal alignment of the lumbar vertebrae.  Degenerative changes in the facet joints.  No focal bone lesion or bone destruction.  Vascular stent in the region of the left renal artery.  IMPRESSION: Diffuse degenerative changes in the lumbar spine.  Diffuse bone demineralization with compression fractures of L3 and L1 vertebra. Changes consistent with osteoporosis.  Fractures are of indeterminate age but associated end plate degenerative changes suggest chronic fractures.   Original Report Authenticated By: Burman Nieves, M.D.    Ct Lumbar Spine Wo Contrast  06/25/2012  *RADIOLOGY REPORT*  Clinical Data: Back pain.  Difficulty with ambulation.  CT LUMBAR SPINE WITHOUT CONTRAST  Technique:  Multidetector  CT imaging of the lumbar spine was performed without intravenous contrast administration. Multiplanar CT image reconstructions were also generated.  Comparison: Plain films from Dr. Antony Contras office 06/23/2012. Also lumbar spine films 06/11/2012, [facility].  Findings: The patient has  acute compression fractures of L1 and L3.  At L1 there is superior and inferior endplate disruption with vacuum phenomenon within the vertebral body, suggesting osteonecrosis.  Slight retropulsion of posterior superior endplate at  L1 of 3 mm.  Less than 50% vertebral body height loss L1 anteriorly.  At L3 there is primarily superior endplate depression.  There is loss of approximately 25-30% vertebral body height.  Calcified protrusion posteriorly in the midline at L2-L3. No significant bony retropulsion.  Slight superior endplate discontinuity at L4 could represent a third compression fracture.  It is difficult to assess whether this has changed from previous plain films.  No significant stenosis or nerve root encroachment at L1-L2. Moderate stenosis at L2-3 relates to posterior protrusion calcific disc material and facet arthropathy.  Central protrusion at L3-4 results in apparent spinal  stenosis. Advanced disc space narrowing L4-L5. At L5-S1, there is 3-4 mm of facet mediated anterolisthesis. Bilateral neural foraminal narrowing is present at this level, multifactorial.  Multiple other areas of facet arthropathy is seen without facet mediated slip.  Vascular calcifications in the aorta is seen without aneurysmal dilatation.  IMPRESSION: Acute compression fractures of L1 and L3.  These have increased from 06/11/2012 but appear fairly similar to plain films of 06/23/2012. In the appropriate clinical setting, vertebral augmentation could be helpful in alleviating pain and increasing mobility.  An additional slight superior endplate deformity at L4 is not excluded without significant wedging.  Degenerative disc disease as described throughout the lumbar spine.   Original Report Authenticated By: Davonna Belling, M.D.       Assessment & Plan  1. compression fracture of lumbar spine L1 L2-L3, acute. Patient is incapacitated and lives home alone with her daughter checking on her at times. Consult IR in AM for vertebroplasty 2. Hypertension seems to be controlled 3. Hyperlipidemia, continue on Lipitor 4. Atrial fib; rate controlled patient in Coumadin which will be put on hold and started on Lovenox if INR is less than 2 in preparation for vertebroplasty 5. 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 Mild renal insufficiency   DVT Prophylaxis Lovenox  AM Labs Ordered, also please review Full Orders  Family Communication: Admission, patients condition and plan of care including tests being ordered have been discussed with the patient and daughter who indicate understanding and agree with the plan and Code Status.  Code Status DO NOT RESUSCITATE  Disposition Plan: Home with home health or nursing home rehabilitation  Time spent in minutes : 35 minutes  Condition fair

## 2012-06-25 NOTE — Progress Notes (Signed)
[facility] ED Cm consulted with Dr Sharyon Medicus [phone] on admission Orders to be clarified in [facility]

## 2012-06-25 NOTE — Progress Notes (Signed)
[facility] ED CM noted Cm consult requesting pt be followed for possible d/c needs Pt living alone  CM spoke with the pt, her niece, Darel Hong and her youngest sister, Harriett Sine about CM consult, level of care recommendations by [facility] therapy staff and provided [employer] for home health, private duty nursing, assisted living facilities and skilled nursing facilities to assist with choice. Confirms pt does live alone   Darel Hong, niece advocated for pt and requests family conferences or request for at least one of pt's nieces Darel Hong cell [phone] and Jamie cell [phone] ) only to be present during any assessment of the pt for fear the pt may not share "the complete picture" related to her illness, limitations and care needs. Harriett Sine and pt agreed. They report that family will attempt to be with the pt in [facility] as much as possible Darel Hong reports pt stating she can get off her commode but observed pt only able to do this prior to 06/25/12 by "hugging a hamper in front of her to get up" Darel Hong reports pt also noted to have been able to bake a cake the day before but on today unable to get off the chair after stating she was incontinent in her bed and would not return to sleeping in the bed. Darel Hong reports that the pt would not share these things in details if someone would ask her direct questions because of "pride and independence"   Darel Hong states pt has a living will and will bring it in for a copy to be made   Choices offered  HOME HEALTH AGENCIES SERVING [place]   Agencies that are Medicare-Certified and are affiliated with [facility] Agency  Telephone Number Address  [facility]   The [facility] has ownership interest in this company; however, you are under no obligation to use this agency. [phone] or  [phone] [address] [place], [place] [address] [email or web address]   Agencies that are Medicare-Certified and are not affiliated with [facility] Agency Telephone Number Address  [facility] [phone] Fax [phone] [address], [address] [place], [place]  [address] [email or web address]  [facility] [phone] or [phone] Fax [phone] [address] [place], [place] [address] [email or web address]  [facility] [phone] Fax [phone] [address] [place], [place] [address] [email or web address]  [facility] [phone] Fax [phone] [address] [place], [place]  [address] [email or web address]  [facility] [phone] Fax [phone] [address] Suite [place], [place] [address] [email or web address]  [facility] [phone] [address] [place], [place] [address] [email or web address]  [facility] [phone]  [address] [place], [place] [address] [email or web address]  [facility] [phone] or [phone] Fax number [phone] [address], [address] [place], [place]  [address] [email or web address]  [facility] [phone] Fax [phone] [address]  [place], [place]  [address]  [facility]  [phone] Fax [phone] [address] [place], [place] [address] [email or web address]

## 2012-06-25 NOTE — ED Notes (Signed)
MD at bedside. Dr. Ghim at bedside.  

## 2012-06-26 ENCOUNTER — Observation Stay (HOSPITAL_COMMUNITY): Payer: Medicare Other

## 2012-06-26 ENCOUNTER — Encounter (HOSPITAL_COMMUNITY): Payer: Self-pay | Admitting: Radiology

## 2012-06-26 DIAGNOSIS — G8929 Other chronic pain: Secondary | ICD-10-CM

## 2012-06-26 DIAGNOSIS — M8448XA Pathological fracture, other site, initial encounter for fracture: Principal | ICD-10-CM

## 2012-06-26 DIAGNOSIS — M549 Dorsalgia, unspecified: Secondary | ICD-10-CM

## 2012-06-26 LAB — URINE MICROSCOPIC-ADD ON

## 2012-06-26 LAB — URINALYSIS, ROUTINE W REFLEX MICROSCOPIC
Ketones, ur: NEGATIVE mg/dL
Protein, ur: NEGATIVE mg/dL
Urobilinogen, UA: 4 mg/dL — ABNORMAL HIGH (ref 0.0–1.0)

## 2012-06-26 MED ORDER — PHYTONADIONE 5 MG PO TABS
5.0000 mg | ORAL_TABLET | Freq: Once | ORAL | Status: DC
  Filled 2012-06-26: qty 1

## 2012-06-26 MED ORDER — ENSURE COMPLETE PO LIQD
237.0000 mL | Freq: Two times a day (BID) | ORAL | Status: DC
  Administered 2012-06-27 – 2012-06-29 (×4): 237 mL via ORAL

## 2012-06-26 NOTE — Progress Notes (Signed)
Patient ID: Tonya Farley, female   DOB: March 12, 1920, 79 y.o.   MRN: [id number]  [facility] PROGRESS NOTE  Tonya Farley QIO:[id number] DOB: 1920/02/09 DOA: 06/25/2012 PCP: Lorenda Peck, MD  Brief narrative: Pt is 79 y.o. Female who presented with back pain that started around 2 weeks prior to this admission, progressively worsening, associated with inability to ambulate.   X-ray done on 3/20 showed compression fractures from L1-L3 and a CT scan done on admission confirmed these findings.  Principal Problem:   Compression fracture of lumbar spine, non-traumatic - appreciate IR input, MRI also confirmed acute compression fracture - will follow up on IR recommendations  Active Problems:   Anemia of chronic disease - slight drop in HG since admission - CBC in AM   Acute renal failure  - likely pre renal etiology imposed on chronic kidney disease stage I-II - creatinine is trending down - BMP in AM   Hypertension - reasonable inpatient control    Atrial fibrillation - hold Coumadin as INR supra therapeutic  Consultants:  IR  Procedures/Studies: Ct Lumbar Spine Wo Contrast 06/25/2012   Acute compression fractures of L1 and L3.  These have increased from 06/11/2012 but appear fairly similar to plain films of 06/23/2012. In the appropriate clinical setting, vertebral augmentation could be helpful in alleviating pain and increasing mobility.  An additional slight superior endplate deformity at L4 is not excluded without significant wedging.  Degenerative disc disease as described throughout the lumbar spine.     Mr Lumbar Spine Wo Contrast 06/26/2012    1.  Acute L1 and L4 vertebral body fractures with mild posterior superior retropulsion at L1 no significant canal compromise.  2.  Remote compression fracture of L3.  3.  Multilevel disc disease and facet disease with spinal, lateral recess and foraminal stenosis as discussed above.    Antibiotics:  None  Code  Status: Full Family Communication: Pt at bedside Disposition Plan: Home when medically stable  HPI/Subjective: No events overnight.   Objective: Filed Vitals:   06/25/12 2026 06/25/12 2031 06/26/12 0421 06/26/12 1333  BP: 136/58  116/54 105/62  Pulse: 97  93 71  Temp: 98.1 F (36.7 C)  98.4 F (36.9 C) 97.6 F (36.4 C)  TempSrc: Oral  Oral Oral  Resp: 18  18 17   Height: 5\' 4"  (1.626 m)     Weight: 71.759 kg (158 lb 3.2 oz)     SpO2: 100% 100% 100% 95%    Intake/Output Summary (Last 24 hours) at 06/26/12 1721 Last data filed at 06/26/12 1306  Gross per 24 hour  Intake    480 ml  Output    300 ml  Net    180 ml    Exam:   General:  Pt is alert, follows commands appropriately, not in acute distress  Cardiovascular: Irregular rate and rhythm, S1/S2, no murmurs, no rubs, no gallops  Respiratory: Clear to auscultation bilaterally, no wheezing, no crackles, no rhonchi  Abdomen: Soft, non tender, non distended, bowel sounds present, no guarding  Extremities: No edema, pulses DP and PT palpable bilaterally  Neuro: Grossly nonfocal  Data Reviewed: Basic Metabolic Panel:  Recent Labs Lab 06/25/12 1752 06/25/12 2056  NA 141  --   K 4.2  --   CL 107  --   GLUCOSE 150*  --   BUN 22  --   CREATININE 1.30* 1.18*   CBC:  Recent Labs Lab 06/25/12 1752 06/25/12 2056  WBC  --  14.3*  HGB 11.2* 10.5*  HCT 33.0* 32.4*  MCV  --  86.6  PLT  --  343   Scheduled Meds: . atorvastatin  10 mg Oral q1800  . B-complex with vitamin C  1 tablet Oral Daily  . diltiazem  300 mg Oral Daily  . furosemide  40 mg Oral Daily  . labetalol  200 mg Oral BID  . levothyroxine  100 mcg Oral QAC breakfast  . phytonadione  5 mg Oral Once  . potassium chloride SA  20 mEq Oral Daily  . sodium chloride  3 mL Intravenous Q12H   Continuous Infusions:   Debbora Presto, MD  [facility] Pager [phone]  If 7PM-7AM, please contact night-coverage [email or web address] Password [email or web address] 06/26/2012,  5:21 PM   LOS: 1 day

## 2012-06-26 NOTE — Care Management Note (Signed)
Cm spoke with patient's niece Tonya Farley at the bedside concerning discharge planning. Pt lived alone prior to hospital admission with out [facility] services or private care.Per niece, pt & family desire patient to discharge to a SNF if appropriate. Cm informed niece Physical therapy will evaluate patient and note recommendations. Cm informed niece if PT recommends 24 supervision/SNF then CSW will consult with patient & family. Pt's family state thay have a few facilities in mind.   [name] [name],RN,BSN [phone]

## 2012-06-26 NOTE — Progress Notes (Signed)
Reason for Consult:Back pain  Referring Physician: Izola Price  HPI: Tonya Farley is an 79 y.o. female who has been admitted with severe back pain which started abruptly about 2 weeks ago. She is taking some po pain meds with some relief but overall, pain level has not improved much since onset. She denies bowel/bladder incontinence, or LE pain, paresthesias. She has had plain films and CT showing L1 and L3 compression fractures. No known hx of previous malignancy. IR is requested to evaluate for possible treatment with VP/KP. PMHx and meds reviewed. Pt lives home alone, but has family that checks on her and helps her, She reports she was 'baking a cake' at the onset of pain.    Past Medical History:  Past Medical History  Diagnosis Date  . Hypertension   . Hyperlipidemia   . Thyroid disease   . Hypothyroidism   . Chronic back pain   . PONV (postoperative nausea and vomiting)     Surgical History:  Past Surgical History  Procedure Laterality Date  . Appendectomy    . Tonsillectomy    . Cataracts      bilateral  . Eye surgery      left eye "hole"    Family History: History reviewed. No pertinent family history.  Social History:  reports that she has never smoked. She has never used smokeless tobacco. She reports that she does not drink alcohol or use illicit drugs.  Allergies:  Allergies  Allergen Reactions  . Penicillins Anaphylaxis and Swelling  . Sulfa Antibiotics Anaphylaxis and Swelling    Medications:  Prior to Admission:  Prescriptions prior to admission  Medication Sig Dispense Refill  . atorvastatin (LIPITOR) 10 MG tablet Take 10 mg by mouth daily.      . B Complex-C (B-COMPLEX WITH VITAMIN C) tablet Take 1 tablet by mouth daily.      . diazepam (VALIUM) 2 MG tablet Take 1 tablet (2 mg total) by mouth every 8 (eight) hours as needed (muscle spasms or cramping).  20 tablet  0  . diltiazem (CARDIZEM CD) 300 MG 24 hr capsule Take 300 mg by mouth daily.      .  furosemide (LASIX) 40 MG tablet Take 40 mg by mouth daily.       Marland Kitchen labetalol (NORMODYNE) 200 MG tablet Take 200 mg by mouth 2 (two) times daily.      Marland Kitchen levothyroxine (SYNTHROID, LEVOTHROID) 100 MCG tablet Take 100 mcg by mouth daily.      . potassium chloride SA (K-DUR,KLOR-CON) 20 MEQ tablet Take 20 mEq by mouth daily.      . traMADol (ULTRAM) 50 MG tablet Take 1 tablet (50 mg total) by mouth every 6 (six) hours as needed for pain.  15 tablet  0  . warfarin (COUMADIN) 1 MG tablet Take 2-3 mg by mouth daily. 3 mg Mondays  2mg  all other days        ROS: See HPI for pertinent findings, otherwise complete 10 system review negative.  Physical Exam: Blood pressure 116/54, pulse 93, temperature 98.4 F (36.9 C), temperature source Oral, resp. rate 18, height 5\' 4"  (1.626 m), weight 158 lb 3.2 oz (71.759 kg), SpO2 100.00%. Gen: elderly female, NAD, alert with normal mentation ENT: unremarkable Lungs: CTA Heart: Irreg, irreg Back: mildly tender upper lumbar region Ext: good ROM, 4/5 strength  Labs: CBC  Recent Labs  06/25/12 1752 06/25/12 2056  WBC  --  14.3*  HGB 11.2* 10.5*  HCT 33.0* 32.4*  PLT  --  343   MET  Recent Labs  06/25/12 1752 06/25/12 2056  NA 141  --   K 4.2  --   CL 107  --   GLUCOSE 150*  --   BUN 22  --   CREATININE 1.30* 1.18*   No results found for this basename: PROT, ALBUMIN, AST, ALT, ALKPHOS, BILITOT, BILIDIR, IBILI, LIPASE,  in the last 72 hours PT/INR  Recent Labs  06/25/12 2056  LABPROT 40.1*  INR 4.53*   ABG No results found for this basename: PHART, PCO2, PO2, HCO3,  in the last 72 hours    Ct Lumbar Spine Wo Contrast  06/25/2012  *RADIOLOGY REPORT*  Clinical Data: Back pain.  Difficulty with ambulation.  CT LUMBAR SPINE WITHOUT CONTRAST  Technique:  Multidetector CT imaging of the lumbar spine was performed without intravenous contrast administration. Multiplanar CT image reconstructions were also generated.  Comparison: Plain films  from Dr. Antony Contras office 06/23/2012. Also lumbar spine films 06/11/2012, [facility].  Findings: The patient has  acute compression fractures of L1 and L3.  At L1 there is superior and inferior endplate disruption with vacuum phenomenon within the vertebral body, suggesting osteonecrosis.  Slight retropulsion of posterior superior endplate at  L1 of 3 mm.  Less than 50% vertebral body height loss L1 anteriorly.  At L3 there is primarily superior endplate depression.  There is loss of approximately 25-30% vertebral body height.  Calcified protrusion posteriorly in the midline at L2-L3. No significant bony retropulsion.  Slight superior endplate discontinuity at L4 could represent a third compression fracture.  It is difficult to assess whether this has changed from previous plain films.  No significant stenosis or nerve root encroachment at L1-L2. Moderate stenosis at L2-3 relates to posterior protrusion calcific disc material and facet arthropathy.  Central protrusion at L3-4 results in apparent spinal stenosis. Advanced disc space narrowing L4-L5. At L5-S1, there is 3-4 mm of facet mediated anterolisthesis. Bilateral neural foraminal narrowing is present at this level, multifactorial.  Multiple other areas of facet arthropathy is seen without facet mediated slip.  Vascular calcifications in the aorta is seen without aneurysmal dilatation.  IMPRESSION: Acute compression fractures of L1 and L3.  These have increased from 06/11/2012 but appear fairly similar to plain films of 06/23/2012. In the appropriate clinical setting, vertebral augmentation could be helpful in alleviating pain and increasing mobility.  An additional slight superior endplate deformity at L4 is not excluded without significant wedging.  Degenerative disc disease as described throughout the lumbar spine.   Original Report Authenticated By: Davonna Belling, M.D.     Assessment/Plan: Back pain secondary to L1 and L3 compression  fractures. Afib on chronic anticoagulation. Admission INR 4.53, Coumadin has been held Reviewed imaging with IR MD, who feels MRI is necessary to further evaluate fractures and assess for retropulsion She is otherwise a candidate for vertebroplasty. INR will need to be normal in order to proceed with procedure. Explained procedure to pt including risks, complications, use of sedation. Will order MRI and follow along. Pain control and correction of INR. Hopefully we can move fwd with procedure early next week.  Brayton El PA-C 06/26/2012, 10:35 AM

## 2012-06-26 NOTE — Care Management (Signed)
Per VO from Dr. Danie Binder, change pt from OBS to Inpt.   [name] Mihir Flanigan,RN,BSN [phone]

## 2012-06-26 NOTE — Progress Notes (Signed)
Nutrition Brief Note  Patient identified on the Malnutrition Screening Tool (MST) Report  Body mass index is 27.14 kg/(m^2). Patient meets criteria for overweight based on current BMI.   Current diet order is CHO modified, patient is consuming approximately 50-75% of meals at this time. Labs and medications reviewed. Noted pt with elevated creatinine and low GFR. Pt reports poor appetite at home but has been trying to supplement her diet with Ensure/Boost daily for additional nutrition. Pt reports weight relatively stable. Pt tries to eat 3 meals/day. Pt currently eating well without any nutrition concerns other than wanting any Ensure. Pt denies any problems chewing/swallowing.   No nutrition interventions warranted at this time. If nutrition issues arise, please consult RD.   Levon Hedger MS, RD, LDN [phone] Pager [phone] After Hours Pager

## 2012-06-27 LAB — BASIC METABOLIC PANEL
BUN: 16 mg/dL (ref 6–23)
CO2: 31 mEq/L (ref 19–32)
Calcium: 9.4 mg/dL (ref 8.4–10.5)
Creatinine, Ser: 1.16 mg/dL — ABNORMAL HIGH (ref 0.50–1.10)
GFR calc non Af Amer: 40 mL/min — ABNORMAL LOW (ref >90)
Glucose, Bld: 103 mg/dL — ABNORMAL HIGH (ref 70–99)

## 2012-06-27 LAB — URINALYSIS, ROUTINE W REFLEX MICROSCOPIC
Bilirubin Urine: NEGATIVE
Glucose, UA: NEGATIVE mg/dL
Hgb urine dipstick: NEGATIVE
Ketones, ur: NEGATIVE mg/dL
Protein, ur: NEGATIVE mg/dL

## 2012-06-27 LAB — PROTIME-INR: Prothrombin Time: 18.4 seconds — ABNORMAL HIGH (ref 11.6–15.2)

## 2012-06-27 LAB — CBC
HCT: 34.5 % — ABNORMAL LOW (ref 36.0–46.0)
Hemoglobin: 11 g/dL — ABNORMAL LOW (ref 12.0–15.0)
MCH: 27.6 pg (ref 26.0–34.0)
MCHC: 31.9 g/dL (ref 30.0–36.0)
MCV: 86.7 fL (ref 78.0–100.0)
RBC: 3.98 MIL/uL (ref 3.87–5.11)

## 2012-06-27 MED ORDER — PHYTONADIONE 5 MG PO TABS
5.0000 mg | ORAL_TABLET | Freq: Once | ORAL | Status: AC
  Administered 2012-06-27: 5 mg via ORAL
  Filled 2012-06-27: qty 1

## 2012-06-27 MED ORDER — CIPROFLOXACIN HCL 500 MG PO TABS
500.0000 mg | ORAL_TABLET | Freq: Two times a day (BID) | ORAL | Status: DC
  Administered 2012-06-27 – 2012-06-29 (×5): 500 mg via ORAL
  Filled 2012-06-27 (×7): qty 1

## 2012-06-27 MED ORDER — OXYCODONE-ACETAMINOPHEN 5-325 MG PO TABS
1.0000 | ORAL_TABLET | ORAL | Status: DC | PRN
  Administered 2012-06-27: 1 via ORAL
  Administered 2012-06-28 – 2012-06-29 (×2): 2 via ORAL
  Filled 2012-06-27: qty 2
  Filled 2012-06-27: qty 1
  Filled 2012-06-27: qty 2

## 2012-06-27 MED ORDER — ENOXAPARIN SODIUM 80 MG/0.8ML ~~LOC~~ SOLN
70.0000 mg | Freq: Once | SUBCUTANEOUS | Status: AC
  Administered 2012-06-27: 70 mg via SUBCUTANEOUS
  Filled 2012-06-27: qty 0.8

## 2012-06-27 NOTE — Progress Notes (Signed)
Pt requested to have a catheter placed d/t frequent urination (6 times throughout day).  Order placed.  Discussed the increased risk of developing a UTI with the pt and she decided to wait to place the catheter for now.  Pt aware that MD had placed order and that she can have a foley catheter placed any time throughout the night if she wishes.  Will continue to monitor. Ivery Quale 06/27/2012 2130

## 2012-06-27 NOTE — Evaluation (Signed)
Occupational Therapy Evaluation Patient Details Name: Tonya Farley MRN: [id number] DOB: 1919-06-13 Today's Date: 06/27/2012 Time: 4098-1191 OT Time Calculation (min): 26 min  OT Assessment / Plan / Recommendation Clinical Impression  Pt admitted with lumbar compression fractures that apparently happened spontaneously.  Pain limiting pt's mobility and ADL. Pt lives alone and does not have access to assist at home.  Recommending SNF.    OT Assessment  Patient needs continued OT Services    Follow Up Recommendations  SNF    Barriers to Discharge Decreased caregiver support    Equipment Recommendations  3 in 1 bedside comode    Recommendations for Other Services    Frequency  Min 2X/week    Precautions / Restrictions Precautions Precautions: Fall;Back Precaution Comments: instructed in back safety with bed mobility   Pertinent Vitals/Pain Back, did not rate, premedicated, repositioned    ADL  Eating/Feeding: Independent Where Assessed - Eating/Feeding: Edge of bed Grooming: Wash/dry hands;Wash/dry face;Denture care;Set up Where Assessed - Grooming: Supported sitting Upper Body Bathing: Minimal assistance Where Assessed - Upper Body Bathing: Unsupported sitting Lower Body Bathing: +1 Total assistance Where Assessed - Lower Body Bathing: Unsupported sitting;Supported sit to stand Upper Body Dressing: Set up Where Assessed - Upper Body Dressing: Unsupported sitting Lower Body Dressing: +1 Total assistance Where Assessed - Lower Body Dressing: Unsupported sitting;Supported sit to stand Toilet Transfer: Moderate assistance Toilet Transfer Method: Stand pivot Acupuncturist: Bedside commode Toileting - Clothing Manipulation and Hygiene: +1 Total assistance Where Assessed - Toileting Clothing Manipulation and Hygiene: Standing Transfers/Ambulation Related to ADLs: transfered with mod assist, no device, pivoted only    OT Diagnosis: Generalized weakness;Acute pain   OT Problem List: Decreased activity tolerance;Impaired balance (sitting and/or standing);Decreased knowledge of use of DME or AE;Decreased knowledge of precautions;Pain OT Treatment Interventions: Self-care/ADL training;DME and/or AE instruction;Patient/family education;Therapeutic activities   OT Goals Acute Rehab OT Goals OT Goal Formulation: With patient Time For Goal Achievement: 07/11/12 Potential to Achieve Goals: Good ADL Goals Pt Will Perform Grooming: with supervision;Standing at sink ADL Goal: Grooming - Progress: Goal set today Pt Will Perform Lower Body Bathing: with supervision;Sitting, edge of bed;Sit to stand from bed;with adaptive equipment ADL Goal: Lower Body Bathing - Progress: Goal set today Pt Will Perform Lower Body Dressing: with supervision;Sit to stand from bed;with adaptive equipment ADL Goal: Lower Body Dressing - Progress: Goal set today Pt Will Transfer to Toilet: with supervision;Ambulation;with DME;3-in-1;Maintaining back safety precautions ADL Goal: Toilet Transfer - Progress: Goal set today Pt Will Perform Toileting - Clothing Manipulation: with supervision;Standing ADL Goal: Toileting - Clothing Manipulation - Progress: Goal set today Pt Will Perform Toileting - Hygiene: with supervision;Sit to stand from 3-in-1/toilet ADL Goal: Toileting - Hygiene - Progress: Goal set today Miscellaneous OT Goals Miscellaneous OT Goal #1: Pt will generalize back safety precautions in ADL and mobility independently. OT Goal: Miscellaneous Goal #1 - Progress: Goal set today  Visit Information  Last OT Received On: 06/27/12 Assistance Needed: +1 PT/OT Co-Evaluation/Treatment: Yes    Subjective Data  Subjective: "They can't do the procedure yet because I am on coumadin." Patient Stated Goal: Return to independence.   Prior Functioning     Home Living Lives With: Alone Available Help at Discharge: Available PRN/intermittently;Family (sister, niece) Type of  Home: House Home Access: Stairs to enter Secretary/administrator of Steps: 3 Entrance Stairs-Rails: Right;Left;Can reach both Home Layout: One level Bathroom Shower/Tub: Tub/shower unit;Door (Pt does not shower, sponge bathes.) Bathroom Toilet: Standard Home Adaptive Equipment: Straight  cane;Grab bars in shower Additional Comments: Pt furniture walks at home. Prior Function Level of Independence: Needs assistance Needs Assistance: Light Housekeeping;Meal Prep Meal Prep: Moderate Light Housekeeping: Maximal Able to Take Stairs?: Yes Driving: Yes Communication Communication: HOH Dominant Hand: Right         Vision/Perception Vision - History Baseline Vision: Wears glasses only for reading   Cognition  Cognition Overall Cognitive Status: Appears within functional limits for tasks assessed/performed Arousal/Alertness: Awake/alert Orientation Level: Appears intact for tasks assessed Behavior During Session: [facility] for tasks performed    Extremity/Trunk Assessment Right Upper Extremity Assessment RUE ROM/Strength/Tone: WFL for tasks assessed RUE Coordination: WFL - gross/fine motor Left Upper Extremity Assessment LUE ROM/Strength/Tone: WFL for tasks assessed LUE Coordination: WFL - gross/fine motor   Mobility Bed Mobility Bed Mobility: Rolling Left;Left Sidelying to Sit;Sit to Sidelying Left Rolling Left: 4: Min assist;With rail Left Sidelying to Sit: 3: Mod assist Sit to Sidelying Left: 3: Mod assist Details for Bed Mobility Assistance: required some trunk support to sit up and  to go down to side. , cues for safe technique for back precautions, pt was able to get legs onto bed. Transfers Transfers: Sit to Stand;Stand to Sit Sit to Stand: 4: Min assist;From bed;From chair/3-in-1;With armrests;From elevated surface Stand to Sit: 4: Min assist;With armrests;With upper extremity assist Details for Transfer Assistance: used hands on armrest, moves slowly and guardedly for pain  control., moved from bed to [facility] and back to bed.     Exercise     Balance Balance Balance Assessed: Yes Static Sitting Balance Static Sitting - Balance Support: Bilateral upper extremity supported Static Sitting - Level of Assistance: 5: Stand by assistance Static Sitting - Comment/# of Minutes: used UE's for support for guarding back .   End of Session OT - End of Session Activity Tolerance: Patient limited by pain Patient left: in bed;with call bell/phone within reach  GO     Evern Bio 06/27/2012, 11:16 AM [phone]

## 2012-06-27 NOTE — Progress Notes (Signed)
ANTICOAGULATION CONSULT NOTE - Initial Consult  Pharmacy Consult for Lovenox Indication: atrial fibrillation  Allergies  Allergen Reactions  . Penicillins Anaphylaxis and Swelling  . Sulfa Antibiotics Anaphylaxis and Swelling    Patient Measurements: Height: 5\' 4"  (162.6 cm) Weight: 158 lb 3.2 oz (71.759 kg) IBW/kg (Calculated) : 54.7  Vital Signs: Temp: 97.5 F (36.4 C) (04/05 1325) Temp src: Oral (04/05 1325) BP: 124/70 mmHg (04/05 1325) Pulse Rate: 62 (04/05 1325)  Labs:  Recent Labs  06/25/12 1752  06/25/12 2056 06/26/12 1318 06/27/12 0410 06/27/12 1830  HGB 11.2*  --  10.5*  --  11.0*  --   HCT 33.0*  --  32.4*  --  34.5*  --   PLT  --   --  343  --  332  --   LABPROT  --   < > 40.1* 36.6* 22.2* 18.4*  INR  --   < > 4.53* 4.00* 2.04* 1.58*  CREATININE 1.30*  --  1.18*  --  1.16*  --   < > = values in this interval not displayed.  Estimated Creatinine Clearance: 31.3 ml/min (by C-G formula based on Cr of 1.16).   Medical History: Past Medical History  Diagnosis Date  . Hypertension   . Hyperlipidemia   . Thyroid disease   . Hypothyroidism   . Chronic back pain   . PONV (postoperative nausea and vomiting)   . Chronic a-fib      Assessment:  89 yof on Coumadin PTA for h/o atrial fibrillation.  Patient admitted 4/3 with acute compression fracture and supratherapeutic INR.  Coumadin was placed on hold with plans for vertebroplasty sometimes early next week. Patient received a dose of Vitamin K 5mg  po x 1 this morning (4/5) and repeat INR at 1800 today subtherapeutic at 1.58.  Per first shift communication with Dr. Izola Price, plan was to start Lovenox when INR is subtherapeutic.  Per discussion with Benedetto Coons (mid-level) tonight after INR resulted, will give full dose Lovenox x 1 tonight then follow up with Dr. Izola Price in AM for further instruction.   Pt wts 71.8 kg, Scr 1.16 for CrCl of 31 ml/min.  H/H okay, no bleeding documented.  Goal of Therapy:  INR  2-3 Anti-Xa level 0.6-1.2 units/ml 4hrs after LMWH dose given Monitor platelets by anticoagulation protocol: Yes   Plan:   Lovenox 70 mg sq x 1 tonight  Pharmacy will f/u with Dr. Izola Price in AM for further instruction  Geoffry Paradise, PharmD, BCPS Pager: [phone] 7:16 PM Pharmacy #: [phone]

## 2012-06-27 NOTE — Evaluation (Signed)
Physical Therapy Evaluation Patient Details Name: Tonya Farley MRN: [id number] DOB: 1919/06/27 Today's Date: 06/27/2012 Time: 0902-0928 PT Time Calculation (min): 26 min  PT Assessment / Plan / Recommendation Clinical Impression  Pt. is 79 y/o female admitted 06/25/12 who has diagnosis of L1 and L3 compression fx. and increased pain. Pt. may have procedure for KP/VP. Pt. lives a lone. Pt. will benefit from PT while inacute care. recommend SNF rehab.    PT Assessment  Patient needs continued PT services    Follow Up Recommendations  SNF    Does the patient have the potential to tolerate intense rehabilitation      Barriers to Discharge Decreased caregiver support      Equipment Recommendations  Rolling walker with 5" wheels    Recommendations for Other Services     Frequency      Precautions / Restrictions Precautions Precautions: Fall;Back Precaution Comments: instructed in back safety with bed mobility   Pertinent Vitals/Pain Back pain increases with mobility. Premedicated, repositioned.      Mobility  Bed Mobility Bed Mobility: Rolling Left;Left Sidelying to Sit;Sit to Sidelying Left Rolling Left: 4: Min assist;With rail Left Sidelying to Sit: 3: Mod assist Sit to Sidelying Left: 3: Mod assist Details for Bed Mobility Assistance: required some trunk support to sit up and  to go down to side. , cues for safe technique for back precautions, pt was able to get legs onto bed. Transfers Transfers: Sit to Stand;Stand to Sit;Stand Pivot Transfers Sit to Stand: 4: Min assist;From bed;From chair/3-in-1;With armrests;From elevated surface Stand to Sit: 4: Min assist;With armrests;With upper extremity assist Stand Pivot Transfers: 3: Mod assist Details for Transfer Assistance: used hands on armrest, moves slowly and guardedly for pain control., moved from bed to [facility] and back to bed. Ambulation/Gait Ambulation/Gait Assistance: Not tested (comment)    Exercises     PT  Diagnosis: Difficulty walking;Acute pain  PT Problem List: Decreased activity tolerance;Decreased balance;Decreased mobility;Pain;Decreased knowledge of precautions PT Treatment Interventions:     PT Goals Acute Rehab PT Goals PT Goal Formulation: With patient Time For Goal Achievement: 07/11/12 Potential to Achieve Goals: Good Pt will go Supine/Side to Sit: with modified independence PT Goal: Supine/Side to Sit - Progress: Goal set today Pt will go Sit to Supine/Side: with modified independence PT Goal: Sit to Supine/Side - Progress: Goal set today Pt will go Sit to Stand: with modified independence PT Goal: Sit to Stand - Progress: Goal set today Pt will go Stand to Sit: with modified independence PT Goal: Stand to Sit - Progress: Goal set today Pt will Ambulate: 51 - 150 feet;with supervision;with rolling walker PT Goal: Ambulate - Progress: Goal set today  Visit Information  Last PT Received On: 06/27/12 Assistance Needed: +2 (if walking)    Subjective Data  Subjective: It hurts  when I move Patient Stated Goal: to get  back to driving my car and truck   Prior Functioning  Home Living Lives With: Alone Available Help at Discharge: Available PRN/intermittently;Family Type of Home: House Home Access: Stairs to enter [employer] of Steps: 3 Entrance Stairs-Rails: Right;Left;Can reach both Home Layout: One level Bathroom Shower/Tub: Tub/shower unit;Door Bathroom Toilet: Standard Home Adaptive Equipment: Straight cane;Grab bars in shower Additional Comments: Pt furniture walks at home. Prior Function Level of Independence: Needs assistance Needs Assistance: Light Housekeeping;Meal Prep Meal Prep: Moderate Light Housekeeping: Maximal Able to Take Stairs?: Yes Driving: Yes Communication Communication: HOH Dominant Hand: Right    Cognition  Cognition Overall  Cognitive Status: Appears within functional limits for tasks assessed/performed Arousal/Alertness:  Awake/alert Orientation Level: Appears intact for tasks assessed Behavior During Session: [facility] for tasks performed    Extremity/Trunk Assessment Right Upper Extremity Assessment RUE ROM/Strength/Tone: WFL for tasks assessed RUE Coordination: WFL - gross/fine motor Left Upper Extremity Assessment LUE ROM/Strength/Tone: WFL for tasks assessed LUE Coordination: WFL - gross/fine motor Right Lower Extremity Assessment RLE ROM/Strength/Tone: WFL for tasks assessed Left Lower Extremity Assessment LLE ROM/Strength/Tone: WFL for tasks assessed   Balance Balance Balance Assessed: Yes Static Sitting Balance Static Sitting - Balance Support: Bilateral upper extremity supported Static Sitting - Level of Assistance: 5: Stand by assistance  End of Session PT - End of Session Activity Tolerance: Patient limited by pain Patient left: in bed;with call bell/phone within reach Nurse Communication: Mobility status  GP     Rada Hay 06/27/2012, 2:09 PM Blanchard Kelch PT [phone]

## 2012-06-27 NOTE — Progress Notes (Addendum)
Clinical Social Work Department CLINICAL SOCIAL WORK PLACEMENT NOTE 06/27/2012  Patient:  Tonya Farley, Tonya Farley  Account Number:  [id number] Admit date:  06/25/2012  Clinical Social Worker:  Doroteo Glassman  Date/time:  06/27/2012 02:55 PM  Clinical Social Work is seeking post-discharge placement for this patient at the following level of care:   SKILLED NURSING   (*CSW will update this form in Epic as items are completed)   06/27/2012  Patient/family provided with [facility] Department of Clinical Social Work's list of facilities offering this level of care within the geographic area requested by the patient (or if unable, by the patient's family).  06/27/2012  Patient/family informed of their freedom to choose among providers that offer the needed level of care, that participate in Medicare, Medicaid or managed care program needed by the patient, have an available bed and are willing to accept the patient.  06/27/2012  Patient/family informed of [facility]' ownership interest in [facility], as well as of the fact that they are under no obligation to receive care at this facility.  PASARR submitted to EDS on 06/26/2012 PASARR number received from EDS on 06/26/2012  FL2 transmitted to all facilities in geographic area requested by pt/family on  06/27/2012 FL2 transmitted to all facilities within larger geographic area on 06/27/2012  Patient informed that his/her managed care company has contracts with or will negotiate with  certain facilities, including the following:     Patient/family informed of bed offers received:  06/29/2012 Patient chooses bed at [facility] Physician recommends and patient chooses bed at    Patient to be transferred to  on   Patient to be transferred to facility by   The following physician request were entered in Epic:   Additional Comments:  Providence Crosby, Theresia Majors Clinical Social Work [phone]   Jacklynn Lewis, MSW,  [facility]  Clinical Social Work [phone]

## 2012-06-27 NOTE — Progress Notes (Addendum)
Patient ID: OHANNA GASSERT, female   DOB: [birth year], 79 y.o.   MRN: [id number]  [facility] PROGRESS NOTE  LAURENA VALKO WUJ:[id number] DOB: September 13, 1919 DOA: 06/25/2012 PCP: Lorenda Peck, MD  Brief narrative:  Pt is 79 y.o. Female who presented with back pain that started around 2 weeks prior to this admission, progressively worsening, associated with inability to ambulate.  X-ray done on 3/20 showed compression fractures from L1-L3 and a CT scan done on admission confirmed these findings.   Principal Problem:  Compression fracture of lumbar spine, non-traumatic  - appreciate IR input, MRI also confirmed acute compression fracture  - will follow up on IR recommendations  - INR is still supra therapeutic but trending down - will give additional dose of vitamin K today  Active Problems:  Anemia of chronic disease  - hg and Hct remain stable since admission  - CBC in AM  Acute renal failure  - likely pre renal etiology imposed on chronic kidney disease stage I-II  - creatinine is slowly trending down  - BMP in AM  Hypertension  - reasonable inpatient control  Atrial fibrillation  - hold Coumadin as INR supra therapeutic  Leukocytosis - will check UA as pt reports dysuria - empiric Ciprofloxacin for now   Consultants:  IR Procedures/Studies:  Ct Lumbar Spine Wo Contrast  06/25/2012  Acute compression fractures of L1 and L3. These have increased from 06/11/2012 but appear fairly similar to plain films of 06/23/2012. In the appropriate clinical setting, vertebral augmentation could be helpful in alleviating pain and increasing mobility. An additional slight superior endplate deformity at L4 is not excluded without significant wedging. Degenerative disc disease as described throughout the lumbar spine.  Mr Lumbar Spine Wo Contrast  06/26/2012  1. Acute L1 and L4 vertebral body fractures with mild posterior superior retropulsion at L1 no significant canal compromise.  2.  Remote compression fracture of L3.  3. Multilevel disc disease and facet disease with spinal, lateral recess and foraminal stenosis as discussed above.  Antibiotics:  None  Code Status: DNR Family Communication: Pt at bedside  Disposition Plan: Home when medically stable   HPI/Subjective: No events overnight.   Objective: Filed Vitals:   06/26/12 0421 06/26/12 1333 06/26/12 2059 06/27/12 0620  BP: 116/54 105/62 120/71 117/67  Pulse: 93 71 82 83  Temp: 98.4 F (36.9 C) 97.6 F (36.4 C) 98 F (36.7 C) 98.5 F (36.9 C)  TempSrc: Oral Oral Oral Oral  Resp: 18 17 16 18   Height:      Weight:      SpO2: 100% 95% 93% 95%    Intake/Output Summary (Last 24 hours) at 06/27/12 0938 Last data filed at 06/27/12 9562  Gross per 24 hour  Intake    840 ml  Output   1200 ml  Net   -360 ml    Exam:   General:  Pt is alert, follows commands appropriately, not in acute distress  Cardiovascular: Regular rate and rhythm, S1/S2, no murmurs, no rubs, no gallops  Respiratory: Clear to auscultation bilaterally, no wheezing, no crackles, no rhonchi  Abdomen: Soft, non tender, non distended, bowel sounds present, no guarding  Extremities: No edema, pulses DP and PT palpable bilaterally  Neuro: Grossly nonfocal  Data Reviewed: Basic Metabolic Panel:  Recent Labs Lab 06/25/12 1752 06/25/12 2056 06/27/12 0410  NA 141  --  140  K 4.2  --  3.8  CL 107  --  102  CO2  --   --  31  GLUCOSE 150*  --  103*  BUN 22  --  16  CREATININE 1.30* 1.18* 1.16*  CALCIUM  --   --  9.4   CBC:  Recent Labs Lab 06/25/12 1752 06/25/12 2056 06/27/12 0410  WBC  --  14.3* 14.5*  HGB 11.2* 10.5* 11.0*  HCT 33.0* 32.4* 34.5*  MCV  --  86.6 86.7  PLT  --  343 332    Scheduled Meds: . atorvastatin  10 mg Oral q1800  . B-complex with vitamin C  1 tablet Oral Daily  . diltiazem  300 mg Oral Daily  . feeding supplement  237 mL Oral BID BM  . furosemide  40 mg Oral Daily  . labetalol  200 mg  Oral BID  . levothyroxine  100 mcg Oral QAC breakfast  . phytonadione  5 mg Oral Once  . potassium chloride SA  20 mEq Oral Daily  . sodium chloride  3 mL Intravenous Q12H   Continuous Infusions:    Debbora Presto, MD  [facility] Pager [phone]  If 7PM-7AM, please contact night-coverage [email or web address] Password [facility] 06/27/2012, 9:38 AM   LOS: 2 days

## 2012-06-27 NOTE — Progress Notes (Signed)
Clinical Social Work Department BRIEF PSYCHOSOCIAL ASSESSMENT 06/27/2012  Patient:  MEKA, LEWAN     Account Number:  [id number]     Admit date:  06/25/2012  Clinical Social Worker:  Doroteo Glassman  Date/Time:  06/27/2012 02:52 PM  Referred by:  Physician  Date Referred:  06/27/2012 Referred for  SNF Placement   Other Referral:   Interview type:  Patient Other interview type:   Pt's niece and sister present    PSYCHOSOCIAL DATA Living Status:  ALONE Admitted from facility:   Level of care:   Primary support name:  Janie Darr Primary support relationship to patient:  FAMILY Degree of support available:   strong    CURRENT CONCERNS Current Concerns  Post-Acute Placement   Other Concerns:    SOCIAL WORK ASSESSMENT / PLAN Met with Pt, her niece and sister.    Discussed d/c plans.  Pt agreeable to SNF and gave permission for CSW to send her information to [facility].  Pt hoping that [facility] will have bed availability.    Pt's family not interested in [facility] or [facility].    CSW thanked Pt and her family for their time.   Assessment/plan status:  Psychosocial Support/Ongoing Assessment of Needs Other assessment/ plan:   Information/referral to community resources:   [facility] SNF list    PATIENT'S/FAMILY'S RESPONSE TO PLAN OF CARE: Pt and family thanked CSW for time and assistance.   CSW to continue to follow.  Providence Crosby, LCSWA Clinical Social Work [phone]

## 2012-06-28 LAB — BASIC METABOLIC PANEL
BUN: 20 mg/dL (ref 6–23)
CO2: 30 mEq/L (ref 19–32)
Calcium: 9.6 mg/dL (ref 8.4–10.5)
Glucose, Bld: 114 mg/dL — ABNORMAL HIGH (ref 70–99)
Potassium: 3.7 mEq/L (ref 3.5–5.1)
Sodium: 136 mEq/L (ref 135–145)

## 2012-06-28 LAB — CBC
Hemoglobin: 10.2 g/dL — ABNORMAL LOW (ref 12.0–15.0)
MCH: 28.2 pg (ref 26.0–34.0)
MCV: 87 fL (ref 78.0–100.0)
RBC: 3.62 MIL/uL — ABNORMAL LOW (ref 3.87–5.11)

## 2012-06-28 MED ORDER — ENOXAPARIN SODIUM 80 MG/0.8ML ~~LOC~~ SOLN
1.0000 mg/kg | SUBCUTANEOUS | Status: DC
  Administered 2012-06-28: 70 mg via SUBCUTANEOUS
  Filled 2012-06-28 (×2): qty 0.8

## 2012-06-28 NOTE — Progress Notes (Signed)
ANTICOAGULATION CONSULT NOTE - Follow Up Consult  Pharmacy Consult for Lovenox Indication: Atrial Fibrillation  Allergies  Allergen Reactions  . Penicillins Anaphylaxis and Swelling  . Sulfa Antibiotics Anaphylaxis and Swelling    Patient Measurements: Height: 5\' 4"  (162.6 cm) Weight: 158 lb 3.2 oz (71.759 kg) IBW/kg (Calculated) : 54.7  Vital Signs: Temp: 98.6 F (37 C) (04/06 0642) Temp src: Oral (04/06 0642) BP: 127/74 mmHg (04/06 1006) Pulse Rate: 90 (04/06 1006)  Labs:  Recent Labs  06/25/12 2056  06/27/12 0410 06/27/12 1830 06/28/12 0357  HGB 10.5*  --  11.0*  --  10.2*  HCT 32.4*  --  34.5*  --  31.5*  PLT 343  --  332  --  334  LABPROT 40.1*  < > 22.2* 18.4* 17.3*  INR 4.53*  < > 2.04* 1.58* 1.46  CREATININE 1.18*  --  1.16*  --  1.28*  < > = values in this interval not displayed.  Estimated Creatinine Clearance: 28.4 ml/min (by C-G formula based on Cr of 1.28).  Assessment: 58 yoF with atrial fibrillation on chronic coumadin to have vertebroplasty tentatively 4/8 for symptomatic compression fractures.  INR reversed and pt to have lovenox bridge until procedure.  SCr had small rise, now CrCl < 30 ml/min today. H/H low, stable, plts ok.  No bleeding reported/noted.  1 dose lovenox administered 4/5 @ 2117.    Goal of Therapy:  Anti-Xa level 0.6-1.2 units/ml 4hrs after LMWH dose given Monitor platelets by anticoagulation protocol: Yes   Plan:  1.  Lovenox 1mg /kg = 70mg  SQ q 24 hours starting tonight @ 2100 2.  F/u vertebroplasty plans and d/c lovenox when appropriate.   3.  F/u renal fxn, CBC, clinical course  Haynes Hoehn, PharmD 06/28/2012 11:57 AM  Pager: [phone]

## 2012-06-28 NOTE — Progress Notes (Signed)
Patient ID: Tonya Farley, female   DOB: [birth year], 79 y.o.   MRN: [id number]  [facility] PROGRESS NOTE  [name] WUJ:[id number] DOB: 03/16/1920 DOA: 06/25/2012 PCP: Lorenda Peck, MD  Brief narrative:  Pt is 79 y.o. Female who presented with back pain that started around 2 weeks prior to this admission, progressively worsening, associated with inability to ambulate.  X-ray done on 3/20 showed compression fractures from L1-L3 and a CT scan done on admission confirmed these findings.   Principal Problem:  Compression fracture of lumbar spine, non-traumatic  - appreciate IR input, MRI also confirmed acute compression fracture  - plan for vertebroplasty on 04/08 - INR is now down, will continue Lovenox Active Problems:  Anemia of chronic disease  - Hg and Hct remain stable since admission and at pt's baseline - CBC in AM  Acute renal failure  - likely pre renal etiology imposed on chronic kidney disease stage I-II  - creatinine is now stabilizing at pt's baseline, encourage PO intake - BMP in AM  Hypertension  - reasonable inpatient control  Atrial fibrillation  - hold Coumadin, continue Lovenox  Leukocytosis  - UA is unremarkable but urine culture pending, pt remains afebrile over 48 hours  - empiric Ciprofloxacin day #2/3  Consultants:  IR Procedures/Studies:  Ct Lumbar Spine Wo Contrast  06/25/2012  Acute compression fractures of L1 and L3. These have increased from 06/11/2012 but appear fairly similar to plain films of 06/23/2012. In the appropriate clinical setting, vertebral augmentation could be helpful in alleviating pain and increasing mobility. An additional slight superior endplate deformity at L4 is not excluded without significant wedging. Degenerative disc disease as described throughout the lumbar spine.  Mr Lumbar Spine Wo Contrast  06/26/2012  1. Acute L1 and L4 vertebral body fractures with mild posterior superior retropulsion at L1 no  significant canal compromise.  2. Remote compression fracture of L3.  3. Multilevel disc disease and facet disease with spinal, lateral recess and foraminal stenosis as discussed above.  Antibiotics:  Ciprofloxacin 04/05 -->  Code Status: DNR  Family Communication: Pt at bedside  Disposition Plan: Home when medically stable   HPI/Subjective: No events overnight.   Objective: Filed Vitals:   06/27/12 1325 06/27/12 2119 06/28/12 0642 06/28/12 1006  BP: 124/70 128/58 121/59 127/74  Pulse: 62 89 70 90  Temp: 97.5 F (36.4 C) 98.5 F (36.9 C) 98.6 F (37 C)   TempSrc: Oral Oral Oral   Resp: 16 18 16    Height:      Weight:      SpO2: 96% 95% 97%     Intake/Output Summary (Last 24 hours) at 06/28/12 1159 Last data filed at 06/27/12 1758  Gross per 24 hour  Intake    480 ml  Output    325 ml  Net    155 ml    Exam:   General:  Pt is alert, follows commands appropriately, not in acute distress  Cardiovascular: Irregular rate and rhythm, no murmurs, no rubs, no gallops  Respiratory: Clear to auscultation bilaterally, no wheezing, no crackles, no rhonchi  Abdomen: Soft, non tender, non distended, bowel sounds present, no guarding  Extremities: No edema, pulses DP and PT palpable bilaterally  Neuro: Grossly nonfocal  Data Reviewed: Basic Metabolic Panel:  Recent Labs Lab 06/25/12 1752 06/25/12 2056 06/27/12 0410 06/28/12 0357  NA 141  --  140 136  K 4.2  --  3.8 3.7  CL 107  --  102 99  CO2  --   --  31 30  GLUCOSE 150*  --  103* 114*  BUN 22  --  16 20  CREATININE 1.30* 1.18* 1.16* 1.28*  CALCIUM  --   --  9.4 9.6   CBC:  Recent Labs Lab 06/25/12 1752 06/25/12 2056 06/27/12 0410 06/28/12 0357  WBC  --  14.3* 14.5* 14.8*  HGB 11.2* 10.5* 11.0* 10.2*  HCT 33.0* 32.4* 34.5* 31.5*  MCV  --  86.6 86.7 87.0  PLT  --  343 332 334   Scheduled Meds: . atorvastatin  10 mg Oral q1800  . B-complex with vitamin C  1 tablet Oral Daily  . ciprofloxacin   500 mg Oral BID  . diltiazem  300 mg Oral Daily  . enoxaparin (LOVENOX) injection  1 mg/kg Subcutaneous Q24H  . feeding supplement  237 mL Oral BID BM  . furosemide  40 mg Oral Daily  . labetalol  200 mg Oral BID  . levothyroxine  100 mcg Oral QAC breakfast  . phytonadione  5 mg Oral Once  . potassium chloride SA  20 mEq Oral Daily  . sodium chloride  3 mL Intravenous Q12H   Continuous Infusions:    Debbora Presto, MD  [facility] Pager [phone]  If 7PM-7AM, please contact night-coverage [email or web address] Password [email or web address] 06/28/2012, 11:59 AM   LOS: 3 days

## 2012-06-28 NOTE — Progress Notes (Signed)
Patient ID: Tonya Farley, female   DOB: 1919/10/19, 79 y.o.   MRN: [id number] Pt with symptomatic compression fractures at L1/ L4 levels. Imaging studies were reviewed by Dr. Deanne Coffer and pt is candidate for L1/L4 VP/KP. Pt currently afebrile but WBC sl increased to 14.8 today. Urine cx pending. Pt denies dysuria today, has had urinary frequency which may be partially attributed to lasix. PT 17.3/INR 1.46. Will cont to monitor status and tent schedule for 4/8 if cx's neg and WBC trends downward. Pt aware of plans.

## 2012-06-29 ENCOUNTER — Inpatient Hospital Stay (HOSPITAL_COMMUNITY): Payer: Medicare Other

## 2012-06-29 ENCOUNTER — Encounter (HOSPITAL_COMMUNITY): Payer: Self-pay | Admitting: Radiology

## 2012-06-29 LAB — BASIC METABOLIC PANEL
BUN: 22 mg/dL (ref 6–23)
CO2: 33 mEq/L — ABNORMAL HIGH (ref 19–32)
Calcium: 9.6 mg/dL (ref 8.4–10.5)
GFR calc non Af Amer: 37 mL/min — ABNORMAL LOW (ref >90)
Glucose, Bld: 116 mg/dL — ABNORMAL HIGH (ref 70–99)

## 2012-06-29 LAB — CBC
HCT: 33.8 % — ABNORMAL LOW (ref 36.0–46.0)
Hemoglobin: 10.8 g/dL — ABNORMAL LOW (ref 12.0–15.0)
MCH: 27.8 pg (ref 26.0–34.0)
MCHC: 32 g/dL (ref 30.0–36.0)
MCV: 87.1 fL (ref 78.0–100.0)
RBC: 3.88 MIL/uL (ref 3.87–5.11)

## 2012-06-29 MED ORDER — TRAMADOL HCL 50 MG PO TABS: 50.0000 mg | ORAL_TABLET | ORAL | Status: DC | PRN

## 2012-06-29 MED ORDER — SODIUM CHLORIDE 0.9 % IJ SOLN
10.0000 mL | Freq: Two times a day (BID) | INTRAMUSCULAR | Status: DC
  Administered 2012-06-29 – 2012-07-01 (×2): 10 mL

## 2012-06-29 MED ORDER — SODIUM CHLORIDE 0.9 % IJ SOLN: 10.0000 mL | INTRAMUSCULAR | Status: DC | PRN

## 2012-06-29 MED ORDER — CIPROFLOXACIN IN D5W 200 MG/100ML IV SOLN
200.0000 mg | Freq: Two times a day (BID) | INTRAVENOUS | Status: DC
  Filled 2012-06-29 (×3): qty 100

## 2012-06-29 MED ORDER — CIPROFLOXACIN HCL 500 MG PO TABS
500.0000 mg | ORAL_TABLET | Freq: Once | ORAL | Status: AC
  Administered 2012-06-29: 500 mg via ORAL
  Filled 2012-06-29: qty 1

## 2012-06-29 MED ORDER — VANCOMYCIN HCL IN DEXTROSE 1-5 GM/200ML-% IV SOLN
1000.0000 mg | INTRAVENOUS | Status: AC
  Administered 2012-06-30: 1000 mg via INTRAVENOUS
  Filled 2012-06-29: qty 200

## 2012-06-29 MED ORDER — PROMETHAZINE HCL 25 MG PO TABS
12.5000 mg | ORAL_TABLET | Freq: Four times a day (QID) | ORAL | Status: DC | PRN
  Administered 2012-06-29: 12.5 mg via ORAL
  Filled 2012-06-29 (×2): qty 1

## 2012-06-29 MED ORDER — OXYCODONE-ACETAMINOPHEN 5-325 MG PO TABS: 1.0000 | ORAL_TABLET | Freq: Three times a day (TID) | ORAL | Status: DC | PRN

## 2012-06-29 NOTE — Progress Notes (Signed)
Patient ID: Tonya Farley, female   DOB: [birth year], 79 y.o.   MRN: [id number]  [facility] PROGRESS NOTE  Tonya Farley:[id number] DOB: 06-13-1919 DOA: 06/25/2012 PCP: Lorenda Peck, MD  Brief narrative:  Pt is 79 y.o. Female who presented with back pain that started around 2 weeks prior to this admission, progressively worsening, associated with inability to ambulate.  X-ray done on 3/20 showed compression fractures from L1-L3 and a CT scan done on admission confirmed these findings.   Principal Problem:  Compression fracture of lumbar spine, non-traumatic  - appreciate IR input, MRI also confirmed acute compression fracture  - plan for vertebroplasty on 04/08  - INR is now down, hold Lovenox prior to procedure 12 Active Problems:  Anemia of chronic disease  - Hg and Hct remain stable since admission and at pt's baseline (10-11) - CBC in AM  Acute renal failure  - likely pre renal etiology imposed on chronic kidney disease stage I-II  - creatinine is now stabilizing at pt's baseline, slightly trending down from yesterday 1.28 --> 1.23 - BMP in AM  Hypertension  - reasonable inpatient control  Atrial fibrillation  - hold Coumadin, continue Lovenox but we'll have to hold prior to vertebroplasty Leukocytosis  - UA is unremarkable but urine culture pending, pt remains afebrile over 48 hours  - Patient reports that her urinary urgency and dysuria have now resolved - empiric Ciprofloxacin day #3, continue ciprofloxacin until final urine cultures are back and may discontinue it if results are negative  Consultants:  IR Procedures/Studies:  Ct Lumbar Spine Wo Contrast  06/25/2012  Acute compression fractures of L1 and L3. These have increased from 06/11/2012 but appear fairly similar to plain films of 06/23/2012. In the appropriate clinical setting, vertebral augmentation could be helpful in alleviating pain and increasing mobility. An additional slight superior  endplate deformity at L4 is not excluded without significant wedging. Degenerative disc disease as described throughout the lumbar spine.  Mr Lumbar Spine Wo Contrast  06/26/2012  1. Acute L1 and L4 vertebral body fractures with mild posterior superior retropulsion at L1 no significant canal compromise.  2. Remote compression fracture of L3.  3. Multilevel disc disease and facet disease with spinal, lateral recess and foraminal stenosis as discussed above.  Antibiotics:  Ciprofloxacin 04/05 -->   Code Status: DNR  Family Communication: Pt at bedside  Disposition Plan: Home when medically stable    HPI/Subjective: No events overnight.   Objective: Filed Vitals:   06/28/12 1006 06/28/12 1515 06/28/12 2143 06/29/12 0641  BP: 127/74 116/61 121/67 112/62  Pulse: 90 97 100 89  Temp:  98.2 F (36.8 C) 99.4 F (37.4 C) 98.4 F (36.9 C)  TempSrc:  Oral Oral Oral  Resp:  16 16 16   Height:      Weight:      SpO2:  97% 95% 96%    Intake/Output Summary (Last 24 hours) at 06/29/12 1504 Last data filed at 06/29/12 0851  Gross per 24 hour  Intake    420 ml  Output      0 ml  Net    420 ml    Exam:   General:  Pt is alert, follows commands appropriately, not in acute distress  Cardiovascular: Regular rate and rhythm, S1/S2, no murmurs, no rubs, no gallops  Respiratory: Clear to auscultation bilaterally, no wheezing, no crackles, no rhonchi  Abdomen: Soft, non tender, non distended, bowel sounds present, no guarding  Extremities: No edema, pulses DP  and PT palpable bilaterally  Neuro: Grossly nonfocal  Data Reviewed: Basic Metabolic Panel:  Recent Labs Lab 06/25/12 1752 06/25/12 2056 06/27/12 0410 06/28/12 0357 06/29/12 0353  NA 141  --  140 136 139  K 4.2  --  3.8 3.7 3.7  CL 107  --  102 99 99  CO2  --   --  31 30 33*  GLUCOSE 150*  --  103* 114* 116*  BUN 22  --  16 20 22   CREATININE 1.30* 1.18* 1.16* 1.28* 1.23*  CALCIUM  --   --  9.4 9.6 9.6    CBC:  Recent Labs Lab 06/25/12 1752 06/25/12 2056 06/27/12 0410 06/28/12 0357 06/29/12 0353  WBC  --  14.3* 14.5* 14.8* 13.9*  HGB 11.2* 10.5* 11.0* 10.2* 10.8*  HCT 33.0* 32.4* 34.5* 31.5* 33.8*  MCV  --  86.6 86.7 87.0 87.1  PLT  --  343 332 334 347   Recent Results (from the past 240 hour(s))  URINE CULTURE     Status: None   Collection Time    06/27/12 11:04 AM      Result Value Range Status   Specimen Description URINE, CLEAN CATCH   Final   Special Requests NONE   Final   Culture  Setup Time 06/27/2012 21:39   Final   Colony Count PENDING   Incomplete   Culture Culture reincubated for better growth   Final   Report Status PENDING   Incomplete     Scheduled Meds: . atorvastatin  10 mg Oral q1800  . ciprofloxacin  500 mg Oral BID  . diltiazem  300 mg Oral Daily  . furosemide  40 mg Oral Daily  . labetalol  200 mg Oral BID  . levothyroxine  100 mcg Oral QAC breakfast  . phytonadione  5 mg Oral Once  . potassium chloride SA  20 mEq Oral Daily  . vancomycin  1,000 mg Intravenous On Call   Continuous Infusions:    Debbora Presto, MD  [facility] Pager [phone]  If 7PM-7AM, please contact night-coverage [email or web address] Password [email or web address] 06/29/2012, 3:04 PM   LOS: 4 days

## 2012-06-29 NOTE — Progress Notes (Signed)
IV team called IR regarding picc line malpositioned in the IJ.  I came to the floor and did a power injection with 3 cc syringes.  Floor RN placed an order for a pcxr to check to see if picc was repositioned.  If picc is still in IJ , then the patient would have to come to IR tomorrow for a picc exchange

## 2012-06-29 NOTE — Progress Notes (Signed)
PT cancelled today due to procedure scheduled for tomorrow. Felecia Shelling  PTA [facility]  Acute  Rehab Pager      [phone]

## 2012-06-29 NOTE — Progress Notes (Addendum)
CSW received notification that pt family at bedside and requesting to speak with CSW.  CSW met with pt and pt niece at bedside.  CSW provided SNF bed offers.  Pt and pt niece choose bed at [facility].  CSW contacted facility who confirmed that they could accept pt when pt medically stable for discharge.  Per MD, pt has procedure planned for tomorrow and not yet medically stable for discharge.  Pt niece stated that pt needs to update Healthcare Power of Attorney paperwork as pt healthcare agent has passed away.  CSW provided advanced directive packet at bedside and pt feeling sick to her stomach at this time, but pt niece will contact CSW when pt complete paperwork in order for CSW to arrange notary.  CSW to continue to follow to assist with disposition planning and advanced directives.   Addendum 1:26PM: CSW received notification from pt niece that pt had completed HCPOA and wishing to have document notarized. CSW met with pt and pt niece at bedside and reviewed document and arranged notary to notarize document. Copy provided to pt along with original and copy placed in pt shadow chart to become permanent part of medical record.   Jacklynn Lewis, MSW, LCSWA  Clinical Social Work [phone]

## 2012-06-29 NOTE — H&P (Signed)
HPI: Tonya Farley is an 79 y.o. female who has been admitted with severe back pain which started abruptly about 2 weeks ago. She is taking some po pain meds with some relief but overall, pain level has not improved much since onset.  She denies bowel/bladder incontinence, or LE pain, paresthesias.  She has had plain films and CT showing L1 and L3 compression fractures. MRI has been done and shows acute L1 and L4 fractures, and that L3 fracture is remote. No known hx of previous malignancy.  IR is requested to evaluate for possible treatment with VP/KP.  PMHx and meds reviewed.  Pt lives home alone, but has family that checks on her and helps her, She reports she was 'baking a cake' at the onset of pain.  Past Medical History:  Past Medical History   Diagnosis  Date   .  Hypertension    .  Hyperlipidemia    .  Thyroid disease    .  Hypothyroidism    .  Chronic back pain    .  PONV (postoperative nausea and vomiting)     Surgical History:  Past Surgical History   Procedure  Laterality  Date   .  Appendectomy     .  Tonsillectomy     .  Cataracts       bilateral   .  Eye surgery       left eye "hole"    Family History: History reviewed. No pertinent family history.  Social History: reports that she has never smoked. She has never used smokeless tobacco. She reports that she does not drink alcohol or use illicit drugs.   Allergies:  Allergies   Allergen  Reactions   .  Penicillins  Anaphylaxis and Swelling   .  Sulfa Antibiotics  Anaphylaxis and Swelling    Medications:  Prior to Admission:  Prescriptions prior to admission   Medication  Sig  Dispense  Refill   .  atorvastatin (LIPITOR) 10 MG tablet  Take 10 mg by mouth daily.     .  B Complex-C (B-COMPLEX WITH VITAMIN C) tablet  Take 1 tablet by mouth daily.     .  diazepam (VALIUM) 2 MG tablet  Take 1 tablet (2 mg total) by mouth every 8 (eight) hours as needed (muscle spasms or cramping).  20 tablet  0   .  diltiazem  (CARDIZEM CD) 300 MG 24 hr capsule  Take 300 mg by mouth daily.     .  furosemide (LASIX) 40 MG tablet  Take 40 mg by mouth daily.     Marland Kitchen  labetalol (NORMODYNE) 200 MG tablet  Take 200 mg by mouth 2 (two) times daily.     Marland Kitchen  levothyroxine (SYNTHROID, LEVOTHROID) 100 MCG tablet  Take 100 mcg by mouth daily.     .  potassium chloride SA (K-DUR,KLOR-CON) 20 MEQ tablet  Take 20 mEq by mouth daily.     .  traMADol (ULTRAM) 50 MG tablet  Take 1 tablet (50 mg total) by mouth every 6 (six) hours as needed for pain.  15 tablet  0   .  warfarin (COUMADIN) 1 MG tablet  Take 2-3 mg by mouth daily. 3 mg Mondays 2mg  all other days      ROS: See HPI for pertinent findings, otherwise complete 10 system review negative.   Physical Exam:  BP 112/62  Pulse 89  Temp(Src) 98.4 F (36.9 C) (Oral)  Resp 16  Ht 5'  4" (1.626 m)  Wt 158 lb 3.2 oz (71.759 kg)  BMI 27.14 kg/m2  SpO2 96%  Gen: elderly female, NAD, alert with normal mentation  ENT: unremarkable  Lungs: CTA  Heart: Irreg, irreg  Back: mildly tender upper lumbar region  Ext: good ROM, 4/5 strength   Labs:  OB complications; see prenatal flowsheet and progress note for details.  CBC    Component Value Date/Time   WBC 13.9* 06/29/2012 0353   RBC 3.88 06/29/2012 0353   HGB 10.8* 06/29/2012 0353   HCT 33.8* 06/29/2012 0353   PLT 347 06/29/2012 0353   MCV 87.1 06/29/2012 0353   MCH 27.8 06/29/2012 0353   MCHC 32.0 06/29/2012 0353   RDW 15.5 06/29/2012 0353   BMET BMET    Component Value Date/Time   NA 139 06/29/2012 0353   K 3.7 06/29/2012 0353   CL 99 06/29/2012 0353   CO2 33* 06/29/2012 0353   GLUCOSE 116* 06/29/2012 0353   BUN 22 06/29/2012 0353   CREATININE 1.23* 06/29/2012 0353   CALCIUM 9.6 06/29/2012 0353   GFRNONAA 37* 06/29/2012 0353   GFRAA 43* 06/29/2012 0353    PT/INR  1.38  Ct Lumbar Spine Wo Contrast  06/25/2012 *RADIOLOGY REPORT* Clinical Data: Back pain. Difficulty with ambulation. CT LUMBAR SPINE WITHOUT CONTRAST Technique: Multidetector CT  imaging of the lumbar spine was performed without intravenous contrast administration. Multiplanar CT image reconstructions were also generated. Comparison: Plain films from Dr. Antony Contras office 06/23/2012. Also lumbar spine films 06/11/2012, [facility]. Findings: The patient has acute compression fractures of L1 and L3. At L1 there is superior and inferior endplate disruption with vacuum phenomenon within the vertebral body, suggesting osteonecrosis. Slight retropulsion of posterior superior endplate at L1 of 3 mm. Less than 50% vertebral body height loss L1 anteriorly. At L3 there is primarily superior endplate depression. There is loss of approximately 25-30% vertebral body height. Calcified protrusion posteriorly in the midline at L2-L3. No significant bony retropulsion. Slight superior endplate discontinuity at L4 could represent a third compression fracture. It is difficult to assess whether this has changed from previous plain films. No significant stenosis or nerve root encroachment at L1-L2. Moderate stenosis at L2-3 relates to posterior protrusion calcific disc material and facet arthropathy. Central protrusion at L3-4 results in apparent spinal stenosis. Advanced disc space narrowing L4-L5. At L5-S1, there is 3-4 mm of facet mediated anterolisthesis. Bilateral neural foraminal narrowing is present at this level, multifactorial. Multiple other areas of facet arthropathy is seen without facet mediated slip. Vascular calcifications in the aorta is seen without aneurysmal dilatation. IMPRESSION: Acute compression fractures of L1 and L3. These have increased from 06/11/2012 but appear fairly similar to plain films of 06/23/2012. In the appropriate clinical setting, vertebral augmentation could be helpful in alleviating pain and increasing mobility. An additional slight superior endplate deformity at L4 is not excluded without significant wedging. Degenerative disc disease as described throughout the  lumbar spine. Original Report Authenticated By: Davonna Belling, M.D.     Assessment/Plan:  Back pain secondary to acute L1 and L4 compression fractures.  Afib on chronic anticoagulation. Admission INR 4.53, Coumadin has been held, INR now 1.38 Leukocytosis of uncertain etiology, trending down. Urine Cx pending, negative so far. Reviewed imaging with IR MD.  Explained procedure to pt including risks, complications, use of sedation. Hx from niece that pt is sensitive to sedation. Will start with low dose and also give prophylactic Zofran. Plan for procedure with Dr. Bonnielee Haff tomorrow 4/8.  Consent signed in chart  Barbee Shropshire [facility] 06/29/2012 9:48 AM

## 2012-06-29 NOTE — Progress Notes (Signed)
Right PICC in the right IJ attempted to reposition with power flush and repositioning patient . Will have repeat  Xray done around 4:15 pm to verify PICC tip  placement.

## 2012-06-29 NOTE — Progress Notes (Signed)
Peripherally Inserted Central Catheter/Midline Placement  The IV Nurse has discussed with the patient and/or persons authorized to consent for the patient, the purpose of this procedure and the potential benefits and risks involved with this procedure.  The benefits include less needle sticks, lab draws from the catheter and patient may be discharged home with the catheter.  Risks include, but not limited to, infection, bleeding, blood clot (thrombus formation), and puncture of an artery; nerve damage and irregular heat beat.  Alternatives to this procedure were also discussed.  PICC/Midline Placement Documentation        Lisabeth Devoid 06/29/2012, 2:17 PM Consent obtained by Lazarus Gowda, RN

## 2012-06-30 ENCOUNTER — Inpatient Hospital Stay (HOSPITAL_COMMUNITY): Payer: Medicare Other

## 2012-06-30 LAB — CBC WITH DIFFERENTIAL/PLATELET
Basophils Absolute: 0 10*3/uL (ref 0.0–0.1)
Eosinophils Relative: 1 % (ref 0–5)
HCT: 35.2 % — ABNORMAL LOW (ref 36.0–46.0)
Hemoglobin: 11.3 g/dL — ABNORMAL LOW (ref 12.0–15.0)
Lymphocytes Relative: 7 % — ABNORMAL LOW (ref 12–46)
MCHC: 32.1 g/dL (ref 30.0–36.0)
MCV: 86.5 fL (ref 78.0–100.0)
Monocytes Absolute: 1.4 10*3/uL — ABNORMAL HIGH (ref 0.1–1.0)
Monocytes Relative: 9 % (ref 3–12)
Neutro Abs: 13.4 10*3/uL — ABNORMAL HIGH (ref 1.7–7.7)
RDW: 15.6 % — ABNORMAL HIGH (ref 11.5–15.5)

## 2012-06-30 LAB — BASIC METABOLIC PANEL
BUN: 19 mg/dL (ref 6–23)
CO2: 31 mEq/L (ref 19–32)
Calcium: 9.8 mg/dL (ref 8.4–10.5)
Chloride: 100 mEq/L (ref 96–112)
Creatinine, Ser: 1.07 mg/dL (ref 0.50–1.10)

## 2012-06-30 LAB — PROTIME-INR: INR: 1.19 (ref 0.00–1.49)

## 2012-06-30 LAB — URINE CULTURE: Colony Count: >=100000

## 2012-06-30 MED ORDER — LEVOFLOXACIN IN D5W 500 MG/100ML IV SOLN
500.0000 mg | Freq: Once | INTRAVENOUS | Status: AC
  Administered 2012-06-30: 500 mg via INTRAVENOUS
  Filled 2012-06-30: qty 100

## 2012-06-30 MED ORDER — LIDOCAINE HCL (PF) 1 % IJ SOLN
INTRAMUSCULAR | Status: AC
  Filled 2012-06-30: qty 30

## 2012-06-30 MED ORDER — FENTANYL CITRATE 0.05 MG/ML IJ SOLN
INTRAMUSCULAR | Status: AC | PRN
  Administered 2012-06-30 (×2): 50 ug via INTRAVENOUS
  Administered 2012-06-30 (×2): 25 ug via INTRAVENOUS

## 2012-06-30 MED ORDER — MIDAZOLAM HCL 2 MG/2ML IJ SOLN
INTRAMUSCULAR | Status: AC | PRN
  Administered 2012-06-30: 1 mg via INTRAVENOUS
  Administered 2012-06-30: 0.5 mg via INTRAVENOUS
  Administered 2012-06-30: 1 mg via INTRAVENOUS
  Administered 2012-06-30: 0.5 mg via INTRAVENOUS

## 2012-06-30 MED ORDER — WARFARIN SODIUM 2 MG PO TABS
2.0000 mg | ORAL_TABLET | Freq: Once | ORAL | Status: AC
  Administered 2012-06-30: 2 mg via ORAL
  Filled 2012-06-30: qty 1

## 2012-06-30 MED ORDER — LEVOFLOXACIN IN D5W 250 MG/50ML IV SOLN
250.0000 mg | INTRAVENOUS | Status: DC
  Filled 2012-06-30: qty 50

## 2012-06-30 MED ORDER — WARFARIN - PHARMACIST DOSING INPATIENT: Freq: Every day | Status: DC

## 2012-06-30 NOTE — Procedures (Signed)
L1 and L4 KP No comp

## 2012-06-30 NOTE — Procedures (Signed)
Right DL PICC exchanged under fluoroscopic guidance. Length 38 cm. Tip SVC/RA junction. No immediate complications.

## 2012-06-30 NOTE — Progress Notes (Signed)
ANTICOAGULATION CONSULT NOTE - Follow Up Consult  Pharmacy Consult for Warfarin Indication: Atrial Fibrillation  Allergies  Allergen Reactions  . Penicillins Anaphylaxis and Swelling  . Sulfa Antibiotics Anaphylaxis and Swelling    Patient Measurements: Height: 5\' 4"  (162.6 cm) Weight: 158 lb 3.2 oz (71.759 kg) IBW/kg (Calculated) : 54.7  Vital Signs: Temp: 98.4 F (36.9 C) (04/08 1500) Temp src: Oral (04/08 1500) BP: 119/73 mmHg (04/08 1500) Pulse Rate: 89 (04/08 1500)  Labs:  Recent Labs  06/28/12 0357 06/29/12 0353 06/30/12 0355  HGB 10.2* 10.8* 11.3*  HCT 31.5* 33.8* 35.2*  PLT 334 347 342  LABPROT 17.3* 16.6* 14.9  INR 1.46 1.38 1.19  CREATININE 1.28* 1.23* 1.07    Estimated Creatinine Clearance: 33.9 ml/min (by C-G formula based on Cr of 1.07).  Assessment: 63 yoF with atrial fibrillation on chronic coumadin PTA was bridged with Lovenox prior to vertebroplasty for symptomatic compression fractures.  Pt s/p vertebroplasty today (4/8) and warfarin to continue post-op for AFib.  Last Lovenox received 4/6 ~ 21:00  PTA Warfarin regimen = 2 mg daily except 3mg  on Mondays  Goal of Therapy:  INR 2-3 Monitor platelets by anticoagulation protocol: Yes   Plan:  1.  Warfarin 2 mg po x 1 tonight 2.  Monitor daily PT/INR     Terrilee Files, PharmD  06/30/2012 3:12 PM

## 2012-06-30 NOTE — Progress Notes (Addendum)
Patient ID: Tonya Farley, female   DOB: March 27, 1919, 79 y.o.   MRN: [id number]  [facility] PROGRESS NOTE  Tonya Farley NWG:[id number] DOB: [birth year] DOA: 06/25/2012 PCP: Lorenda Peck, MD  Brief narrative:  Pt is 79 y.o. Female who presented with back pain that started around 2 weeks prior to this admission, progressively worsening, associated with inability to ambulate.  X-ray done on 3/20 showed compression fractures from L1-L3 and a CT scan done on admission confirmed these findings.   Principal Problem:  Compression fracture of lumbar spine, non-traumatic  - appreciate IR input, MRI also confirmed acute compression fracture  - Patient scheduled for vertebral plasty today, will need physical therapy evaluation after the procedure to determine appropriate discharge Active Problems:  Anemia of chronic disease  - Hg and Hct remain stable since admission and at pt's baseline (10-11)  - CBC in AM  Acute renal failure  - likely pre renal etiology imposed on chronic kidney disease stage I-II  - creatinine trending down and is within normal limits this morning - BMP in AM  Hypertension  - reasonable inpatient control  Atrial fibrillation  - Will continue Coumadin after the procedure, will ask pharmacy for assistance in dosing - Will also continue diltiazem and labetalol Leukocytosis  Secondary to UTI, no urosepsis  - UA is unremarkable but urine culture positive for Proteus mirabilis, pan sensitive - Patient has completed 3 days of ciprofloxacin and appears to have difficulty tolerating ciprofloxacin, will change today to Levaquin QD which can be discontinued after tomorrow's dose - This was complete 5 days therapy, CBC in AM  Consultants:  IR Procedures/Studies:  Ct Lumbar Spine Wo Contrast  06/25/2012  Acute compression fractures of L1 and L3. These have increased from 06/11/2012 but appear fairly similar to plain films of 06/23/2012. In the appropriate clinical  setting, vertebral augmentation could be helpful in alleviating pain and increasing mobility. An additional slight superior endplate deformity at L4 is not excluded without significant wedging. Degenerative disc disease as described throughout the lumbar spine.  Mr Lumbar Spine Wo Contrast  06/26/2012  1. Acute L1 and L4 vertebral body fractures with mild posterior superior retropulsion at L1 no significant canal compromise.  2. Remote compression fracture of L3.  3. Multilevel disc disease and facet disease with spinal, lateral recess and foraminal stenosis as discussed above.  Antibiotics:  Ciprofloxacin 04/05 --> 06/30/2012 Levaquin 04/08 --> 04/09  Code Status: DNR  Family Communication: Pt and daughter at bedside  Disposition Plan: PT evaluation after vertebroplasty  HPI/Subjective: No events overnight.   Objective: Filed Vitals:   06/30/12 1334 06/30/12 1345 06/30/12 1358 06/30/12 1430  BP: 129/70 126/90 130/85 127/67  Pulse: 112 104 108 67  Temp:    98.6 F (37 C)  TempSrc:    Oral  Resp: 15 18 16 16   Height:      Weight:      SpO2: 99% 92% 99% 96%    Intake/Output Summary (Last 24 hours) at 06/30/12 1452 Last data filed at 06/30/12 1230  Gross per 24 hour  Intake    290 ml  Output      0 ml  Net    290 ml    Exam:   General:  Pt is alert, follows commands appropriately, not in acute distress  Cardiovascular: Irregular rhythm, tachycardic, no murmurs, no rubs, no gallops  Respiratory: Clear to auscultation bilaterally, no wheezing, no crackles, no rhonchi  Abdomen: Soft, non tender, non distended, bowel  sounds present, no guarding  Extremities: No edema, pulses DP and PT palpable bilaterally  Neuro: Grossly nonfocal  Data Reviewed: Basic Metabolic Panel:  Recent Labs Lab 06/25/12 1752 06/25/12 2056 06/27/12 0410 06/28/12 0357 06/29/12 0353 06/30/12 0355  NA 141  --  140 136 139 138  K 4.2  --  3.8 3.7 3.7 3.9  CL 107  --  102 99 99 100  CO2  --    --  31 30 33* 31  GLUCOSE 150*  --  103* 114* 116* 103*  BUN 22  --  16 20 22 19   CREATININE 1.30* 1.18* 1.16* 1.28* 1.23* 1.07  CALCIUM  --   --  9.4 9.6 9.6 9.8   CBC:  Recent Labs Lab 06/25/12 2056 06/27/12 0410 06/28/12 0357 06/29/12 0353 06/30/12 0355  WBC 14.3* 14.5* 14.8* 13.9* 16.2*  NEUTROABS  --   --   --   --  13.4*  HGB 10.5* 11.0* 10.2* 10.8* 11.3*  HCT 32.4* 34.5* 31.5* 33.8* 35.2*  MCV 86.6 86.7 87.0 87.1 86.5  PLT 343 332 334 347 342   Recent Results (from the past 240 hour(s))  URINE CULTURE     Status: None   Collection Time    06/27/12 11:04 AM      Result Value Range Status   Specimen Description URINE, CLEAN CATCH   Final   Special Requests NONE   Final   Culture  Setup Time 06/27/2012 21:39   Final   Colony Count >=100,000 COLONIES/ML   Final   Culture PROTEUS MIRABILIS   Final   Report Status 06/30/2012 FINAL   Final   Organism ID, Bacteria PROTEUS MIRABILIS   Final     Scheduled Meds: . ciprofloxacin  200 mg Intravenous Q12H  . diltiazem  300 mg Oral Daily  . feeding supplement  237 mL Oral BID BM  . furosemide  40 mg Oral Daily  . labetalol  200 mg Oral BID  . levothyroxine  100 mcg Oral QAC breakfast  . lidocaine (PF)      . potassium chloride SA  20 mEq Oral Daily  . sodium chloride  10-40 mL Intracatheter Q12H  . sodium chloride  3 mL Intravenous Q12H   Continuous Infusions:    Debbora Presto, MD  [facility] Pager [phone]  If 7PM-7AM, please contact night-coverage [email or web address] Password [email or web address] 06/30/2012, 2:52 PM   LOS: 5 days

## 2012-06-30 NOTE — Progress Notes (Signed)
OT Cancellation Note  Patient Details Name: Tonya Farley MRN: [id number] DOB: Nov 17, 1919   Cancelled Treatment:    Reason Eval/Treat Not Completed: Other (comment)  Plan is for VP today after PICC is addressed.  Will check back tomorrow.   Emmanual Gauthreaux 06/30/2012, 11:42 AM Marica Otter, OTR/L [phone] 06/30/2012

## 2012-06-30 NOTE — Clinical Documentation Improvement (Signed)
    UROSEPSIS DOCUMENTATION CLARIFICATION QUERY  THIS DOCUMENT IS NOT A PERMANENT PART OF THE MEDICAL RECORD  TO RESPOND TO THE THIS QUERY, FOLLOW THE INSTRUCTIONS BELOW:  1. If needed, update documentation for the patient's encounter via the notes activity.  2. Access this query again and click edit on the In [employer].  3. After updating, or not, click F2 to complete all highlighted (required) fields concerning your review. Select "additional documentation in the medical record" OR "no additional documentation provided".  4. Click Sign note button.  5. The deficiency will fall out of your In Basket *Please let us know if you are not able to complete this workflow by phone or e-mail (listed below).  Please update your documentation within the medical record to reflect your response to this query.                                                                                                       06/30/12  Dear Dr. Izola Price  The Physician must have documented urosepsis and the patient must have extensive clinical indicators of a generalized sepsis present prior to querying.  By extensive clinical indicators, it is meant that the physician has documented two or more of the clinical indicators outlined below, therefore substantially describing the clinical condition about which the coder will inquire but - not having made the specific or particular diagnosis.  The medical record reflects the following clinical findings:                     Clinical Indicator                       l                                        06/27/12             Specimen Description  URINE, CLEAN CATCH   Special Requests  NONE   Culture Setup Time  06/27/2012 21:39    Colony Count  >=100,000 COLONIES/ML    Culture  PROTEUS MIRABILIS    Report Status  06/30/2012 FINAL   Organism ID, Bacteria  PROTEUS                Based on the clinical findings above and your medical judgement, can you please  update your documentation within the medical record if deemed appropriate.  Reviewed: Leukocytosis secondary to UTI, no clinical symptoms to suggest urosepsis. Pt afebrile and with hemodynamically stable clinical status.Documented in PN, 06/29/2012.  Thank You, Caprice Wasko Syliva Overman RN, BSN  Clinical Documentation Specialist:  Pager [phone] Office [phone] [facility] Health Information Management [place]

## 2012-07-01 ENCOUNTER — Other Ambulatory Visit: Payer: Self-pay | Admitting: Radiology

## 2012-07-01 DIAGNOSIS — S32040A Wedge compression fracture of fourth lumbar vertebra, initial encounter for closed fracture: Secondary | ICD-10-CM

## 2012-07-01 DIAGNOSIS — S32010B Wedge compression fracture of first lumbar vertebra, initial encounter for open fracture: Secondary | ICD-10-CM

## 2012-07-01 DIAGNOSIS — I10 Essential (primary) hypertension: Secondary | ICD-10-CM

## 2012-07-01 LAB — BASIC METABOLIC PANEL
Creatinine, Ser: 1.26 mg/dL — ABNORMAL HIGH (ref 0.50–1.10)
GFR calc Af Amer: 42 mL/min — ABNORMAL LOW (ref >90)
GFR calc non Af Amer: 36 mL/min — ABNORMAL LOW (ref >90)
Glucose, Bld: 114 mg/dL — ABNORMAL HIGH (ref 70–99)
Potassium: 3.7 mEq/L (ref 3.5–5.1)
Sodium: 139 mEq/L (ref 135–145)

## 2012-07-01 LAB — PROTIME-INR
INR: 1.28 (ref 0.00–1.49)
Prothrombin Time: 15.7 seconds — ABNORMAL HIGH (ref 11.6–15.2)

## 2012-07-01 LAB — CBC
Hemoglobin: 10.7 g/dL — ABNORMAL LOW (ref 12.0–15.0)
MCH: 28.2 pg (ref 26.0–34.0)
MCHC: 32.6 g/dL (ref 30.0–36.0)

## 2012-07-01 MED ORDER — WARFARIN SODIUM 1 MG PO TABS
1.0000 mg | ORAL_TABLET | Freq: Once | ORAL | Status: DC
  Filled 2012-07-01: qty 1

## 2012-07-01 MED ORDER — LEVOFLOXACIN 250 MG PO TABS: 250.0000 mg | ORAL_TABLET | Freq: Every day | ORAL | Status: DC

## 2012-07-01 MED ORDER — TRAMADOL HCL 50 MG PO TABS: 50.0000 mg | ORAL_TABLET | Freq: Four times a day (QID) | ORAL | Status: DC | PRN

## 2012-07-01 MED ORDER — ACETAMINOPHEN 325 MG PO TABS: 650.0000 mg | ORAL_TABLET | Freq: Four times a day (QID) | ORAL | Status: DC | PRN

## 2012-07-01 MED ORDER — ENSURE COMPLETE PO LIQD: 237.0000 mL | Freq: Two times a day (BID) | ORAL | Status: DC

## 2012-07-01 MED ORDER — DIAZEPAM 2 MG PO TABS: 2.0000 mg | ORAL_TABLET | Freq: Three times a day (TID) | ORAL | Status: DC | PRN

## 2012-07-01 NOTE — Progress Notes (Signed)
Patient to be discharged to SNF today. Copies of all discharge medications and instructions to be sent to facility. Pt to be transported via [facility].

## 2012-07-01 NOTE — Progress Notes (Signed)
ANTICOAGULATION CONSULT NOTE - Follow Up Consult  Pharmacy Consult for Warfarin Indication: Atrial Fibrillation  Allergies  Allergen Reactions  . Penicillins Anaphylaxis and Swelling  . Sulfa Antibiotics Anaphylaxis and Swelling    Patient Measurements: Height: 5\' 4"  (162.6 cm) Weight: 158 lb 3.2 oz (71.759 kg) IBW/kg (Calculated) : 54.7  Vital Signs: Temp: 99.4 F (37.4 C) (04/09 0505) Temp src: Oral (04/09 0505) BP: 105/64 mmHg (04/09 0505) Pulse Rate: 94 (04/09 0505)  Labs:  Recent Labs  06/29/12 0353 06/30/12 0355 07/01/12 0545  HGB 10.8* 11.3* 10.7*  HCT 33.8* 35.2* 32.8*  PLT 347 342 262  LABPROT 16.6* 14.9 15.7*  INR 1.38 1.19 1.28  CREATININE 1.23* 1.07 1.26*    Estimated Creatinine Clearance: 28.8 ml/min (by C-G formula based on Cr of 1.26).  Assessment: 18 yoF with atrial fibrillation on chronic coumadin PTA was bridged with Lovenox prior to vertebroplasty for symptomatic compression fractures.  Coumadin resumed 4/8 pm s/p vertebroplasty. PTA Warfarin regimen = 2 mg daily except 3mg  on Mondays  INR up some after 2mg  dose last night  No bleeding reported  Pt on levaquin - may increase INR  Goal of Therapy:  INR 2-3   Plan:  1.  Warfarin 1 mg po x 1 tonight 2.  Monitor daily PT/INR     Gwen Her PharmD  [phone] 07/01/2012 7:53 AM

## 2012-07-01 NOTE — Discharge Summary (Signed)
[facility]  Physician Discharge Summary   Patient ID: Tonya Farley MRN: [id number] DOB/AGE: 26-Jun-1919 79 y.o.  Admit date: 06/25/2012 Discharge date: 07/01/2012  PCP: Lorenda Peck, MD  DISCHARGE DIAGNOSES:  Principal Problem:   Compression fracture of lumbar spine, non-traumatic Active Problems:   Hypertension   Hyperlipidemia   Atrial fibrillation   RECOMMENDATIONS FOR OUTPATIENT FOLLOW UP: 1. Patient on Warfarin. Please follow PT/INR closely.   DISCHARGE CONDITION: fair  Diet recommendation: Heart Healthy  Filed Weights   06/25/12 2026  Weight: 71.759 kg (158 lb 3.2 oz)    INITIAL HISTORY: Pt is 79 y.o. Female who presented with back pain that started around 2 weeks prior to this admission, progressively worsening, associated with inability to ambulate. X-ray done on 3/20 showed compression fractures from L1-L3 and a CT scan done on admission confirmed these findings.   Consultations:  IR For vertebroplasty  Procedures:  Vertebroplasty L1 and L4 4/8  PICC Line  HOSPITAL COURSE:   Compression fracture of lumbar spine, non-traumatic  She is status post L1 and L4 kyphoplasty by iR. Patient's pain is better although she hasn't ambulated yet. She will need SNF for short term rehab.   Anemia of chronic disease  Hg and Hct remain stable since admission and at pt's baseline (10-11).   Mild Acute renal failure  Likely pre renal etiology imposed on chronic kidney disease stage I-II. Now back to baseline.   Hypertension  Reasonably well controlled.   Atrial fibrillation  Continue home medications. Continue anticoagulation.   Leukocytosis  UA was unremarkable but urine culture positive for Proteus mirabilis, pan sensitive. Continue Levaquin for 3 more days. Watch for interaction with Warfarin. She remains afebrile.  Overall patient remains stable. Ok for discharge to SNF.  PERTINENT LABS:  The results of significant diagnostics from this  hospitalization (including imaging, microbiology, ancillary and laboratory) are listed below for reference.    Microbiology: Recent Results (from the past 240 hour(s))  URINE CULTURE     Status: None   Collection Time    06/27/12 11:04 AM      Result Value Range Status   Specimen Description URINE, CLEAN CATCH   Final   Special Requests NONE   Final   Culture  Setup Time 06/27/2012 21:39   Final   Colony Count >=100,000 COLONIES/ML   Final   Culture PROTEUS MIRABILIS   Final   Report Status 06/30/2012 FINAL   Final   Organism ID, Bacteria PROTEUS MIRABILIS   Final     Labs: Basic Metabolic Panel:  Recent Labs Lab 06/27/12 0410 06/28/12 0357 06/29/12 0353 06/30/12 0355 07/01/12 0545  NA 140 136 139 138 139  K 3.8 3.7 3.7 3.9 3.7  CL 102 99 99 100 99  CO2 31 30 33* 31 32  GLUCOSE 103* 114* 116* 103* 114*  BUN 16 20 22 19 22   CREATININE 1.16* 1.28* 1.23* 1.07 1.26*  CALCIUM 9.4 9.6 9.6 9.8 9.6   CBC:  Recent Labs Lab 06/27/12 0410 06/28/12 0357 06/29/12 0353 06/30/12 0355 07/01/12 0545  WBC 14.5* 14.8* 13.9* 16.2* 14.2*  NEUTROABS  --   --   --  13.4*  --   HGB 11.0* 10.2* 10.8* 11.3* 10.7*  HCT 34.5* 31.5* 33.8* 35.2* 32.8*  MCV 86.7 87.0 87.1 86.5 86.5  PLT 332 334 347 342 262    IMAGING STUDIES Dg Lumbar Spine Complete  06/11/2012  *RADIOLOGY REPORT*  Clinical Data: Low back pain for 3 days.  LUMBAR SPINE - COMPLETE 4+ VIEW  Comparison: None.  Findings: Five lumbar type vertebrae with partial sacralization of L5.  Compression fractures of L3 and L1 vertebra.  There are associated degenerative changes suggesting these are likely old. No prior comparison studies are available to correlate.  Changes are likely due to osteoporosis.  Normal alignment of the lumbar vertebrae.  Degenerative changes in the facet joints.  No focal bone lesion or bone destruction.  Vascular stent in the region of the left renal artery.  IMPRESSION: Diffuse degenerative changes in the  lumbar spine.  Diffuse bone demineralization with compression fractures of L3 and L1 vertebra. Changes consistent with osteoporosis.  Fractures are of indeterminate age but associated end plate degenerative changes suggest chronic fractures.   Original Report Authenticated By: Burman Nieves, M.D.    Ct Lumbar Spine Wo Contrast  06/25/2012  *RADIOLOGY REPORT*  Clinical Data: Back pain.  Difficulty with ambulation.  CT LUMBAR SPINE WITHOUT CONTRAST  Technique:  Multidetector CT imaging of the lumbar spine was performed without intravenous contrast administration. Multiplanar CT image reconstructions were also generated.  Comparison: Plain films from Dr. Antony Contras office 06/23/2012. Also lumbar spine films 06/11/2012, [facility].  Findings: The patient has  acute compression fractures of L1 and L3.  At L1 there is superior and inferior endplate disruption with vacuum phenomenon within the vertebral body, suggesting osteonecrosis.  Slight retropulsion of posterior superior endplate at  L1 of 3 mm.  Less than 50% vertebral body height loss L1 anteriorly.  At L3 there is primarily superior endplate depression.  There is loss of approximately 25-30% vertebral body height.  Calcified protrusion posteriorly in the midline at L2-L3. No significant bony retropulsion.  Slight superior endplate discontinuity at L4 could represent a third compression fracture.  It is difficult to assess whether this has changed from previous plain films.  No significant stenosis or nerve root encroachment at L1-L2. Moderate stenosis at L2-3 relates to posterior protrusion calcific disc material and facet arthropathy.  Central protrusion at L3-4 results in apparent spinal stenosis. Advanced disc space narrowing L4-L5. At L5-S1, there is 3-4 mm of facet mediated anterolisthesis. Bilateral neural foraminal narrowing is present at this level, multifactorial.  Multiple other areas of facet arthropathy is seen without facet mediated  slip.  Vascular calcifications in the aorta is seen without aneurysmal dilatation.  IMPRESSION: Acute compression fractures of L1 and L3.  These have increased from 06/11/2012 but appear fairly similar to plain films of 06/23/2012. In the appropriate clinical setting, vertebral augmentation could be helpful in alleviating pain and increasing mobility.  An additional slight superior endplate deformity at L4 is not excluded without significant wedging.  Degenerative disc disease as described throughout the lumbar spine.   Original Report Authenticated By: Davonna Belling, M.D.    Mr Lumbar Spine Wo Contrast  06/26/2012  *RADIOLOGY REPORT*  Clinical Data: Compression fractures.  MRI LUMBAR SPINE WITHOUT CONTRAST  Technique:  Multiplanar and multiecho pulse sequences of the lumbar spine were obtained without intravenous contrast.  Comparison: CT scan 06/25/2012  Findings: There are compression fractures of the L1 and L4 vertebral bodies.  These have MR features of acute fractures.  The L3 fracture is remote.  The vertebral bodies demonstrate stable alignment.  Mild degenerative anterolisthesis of L5.  No pars defects.  The facets are normally aligned.  Small hemangiomas are noted in the L4 and L5 vertebral bodies.  No sacral fractures identified.  The conus medullaris terminates at T12-L1.  T12-L1:  Mild retropulsion of the superior posterior aspect of the L1 the pedal body but no significant canal compromise.  There is a fluid-filled cavity in the fractured L1 vertebral body.  The pedicles are intact.  L1-2:  No significant findings.  L2-3:  Diffuse bulging annulus and remote L3 compression fracture with retropulsion contributes to mild to moderate spinal and bilateral lateral recess stenosis.  Mild foraminal encroachment bilaterally.  L3-4:  Diffuse bulging degenerated annulus, short pedicles and facet disease contribute to mild spinal and bilateral lateral recess stenosis.  There is also mild to moderate foraminal  stenosis bilaterally. L for a superior endplate fracture but no retropulsion or canal compromise.  L4-5:  Diffuse bulging annulus and mild osteophytic ridging but no significant spinal or lateral recess stenosis.  There is mild right foraminal stenosis due to a shallow broad-based foraminal disc protrusion.  L5-S1:  Diffuse bulging and slightly uncovered disc with mild flattening of the ventral thecal sac.  There is mild to moderate foraminal stenosis bilaterally, right greater than left.  IMPRESSION:  1.  Acute L1 and L4 vertebral body fractures with mild posterior superior retropulsion at L1 no significant canal compromise. 2.  Remote compression fracture of L3. 3.  Multilevel disc disease and facet disease with spinal, lateral recess and foraminal stenosis as discussed above.   Original Report Authenticated By: Rudie Meyer, M.D.    Ir Kyphoplasty Or Sacroplasty  06/30/2012  *RADIOLOGY REPORT*  Clinical Data/Indication: LOW BACK PAIN IN THE ACUTE SETTING.  THE PATIENT DEVELOPED ACUTE LOW BACK PAIN TO THE WEEKS AGO UPON STANDING UPRIGHT.  SUBSEQUENT IMAGING DEMONSTRATES COMPRESSION FRACTURES AT L1 AND L4.  SHE IS TENDER TO PALPATION DIFFUSELY AT THE MIDLINE M R LUMBAR SPINE.  SHE IS NEUROLOGICALLY INTACT.  LUMBAR KYPHOPLASTY  Sedation: Versed three mg, Fentanyl 150 mg.  Total Moderate Sedation Time: 40 minutes.  Per CMS PQRS reporting requirements (PQRS Measure 24): Given the patient's age of greater than 50 and the fracture site (hip, distal radius, or spine), the patient should be tested for osteoporosis using DXA, and the appropriate treatment considered based on the DXA results.  Fluoroscopy Time: 13.2 minutes.  Procedure: The procedure, risks, benefits, and alternatives were explained to the patient. Questions regarding the procedure were encouraged and answered. The patient understands and consents to the procedure.  The low back was prepped with betadine in a sterile fashion, and a sterile drape was  applied covering the operative field. A sterile gown and sterile gloves were used for the procedure.  1% lidocaine was utilized for local anesthesia.  Under fluoroscopic guidance, the 8-gauge needle was advanced into the L1 vertebral body via a right pedicle approach.  A drill was utilized to create a channel and vertebral body.  The 2 cm balloon was then deployed into the vertebral body and insufflated to 150 pounds per square inch.  The identical procedure was performed in L4 via left pedicle.  Bone cement was then injected into the L1 vertebral body utilizing the deployment trocar.  This filled vertebral body crossing the midline and extending to the superior as well as inferior endplates.  No extra osseous cement.  The identical procedure was performed in L4.  There is primarily filling in the lower to thirds but there is some interdigitation extending to the superior endplate.  No extra osseous cement.  Findings: Imaging demonstrates L1 and L4 kyphoplasty.  Bone cement fills the L1 and L4 vertebral bodies as described above.  Complications: None.  IMPRESSION: Successful L1  and L4 kyphoplasty.   Original Report Authenticated By: Jolaine Click, M.D.    Ir Kyphoplasty Or Sacroplasty  06/30/2012  *RADIOLOGY REPORT*  Clinical Data/Indication: LOW BACK PAIN IN THE ACUTE SETTING.  THE PATIENT DEVELOPED ACUTE LOW BACK PAIN TO THE WEEKS AGO UPON STANDING UPRIGHT.  SUBSEQUENT IMAGING DEMONSTRATES COMPRESSION FRACTURES AT L1 AND L4.  SHE IS TENDER TO PALPATION DIFFUSELY AT THE MIDLINE M R LUMBAR SPINE.  SHE IS NEUROLOGICALLY INTACT.  LUMBAR KYPHOPLASTY  Sedation: Versed three mg, Fentanyl 150 mg.  Total Moderate Sedation Time: 40 minutes.  Per CMS PQRS reporting requirements (PQRS Measure 24): Given the patient's age of greater than 50 and the fracture site (hip, distal radius, or spine), the patient should be tested for osteoporosis using DXA, and the appropriate treatment considered based on the DXA results.   Fluoroscopy Time: 13.2 minutes.  Procedure: The procedure, risks, benefits, and alternatives were explained to the patient. Questions regarding the procedure were encouraged and answered. The patient understands and consents to the procedure.  The low back was prepped with betadine in a sterile fashion, and a sterile drape was applied covering the operative field. A sterile gown and sterile gloves were used for the procedure.  1% lidocaine was utilized for local anesthesia.  Under fluoroscopic guidance, the 8-gauge needle was advanced into the L1 vertebral body via a right pedicle approach.  A drill was utilized to create a channel and vertebral body.  The 2 cm balloon was then deployed into the vertebral body and insufflated to 150 pounds per square inch.  The identical procedure was performed in L4 via left pedicle.  Bone cement was then injected into the L1 vertebral body utilizing the deployment trocar.  This filled vertebral body crossing the midline and extending to the superior as well as inferior endplates.  No extra osseous cement.  The identical procedure was performed in L4.  There is primarily filling in the lower to thirds but there is some interdigitation extending to the superior endplate.  No extra osseous cement.  Findings: Imaging demonstrates L1 and L4 kyphoplasty.  Bone cement fills the L1 and L4 vertebral bodies as described above.  Complications: None.  IMPRESSION: Successful L1 and L4 kyphoplasty.   Original Report Authenticated By: Jolaine Click, M.D.    Ir Fluoro Guide Cv Line Right  06/30/2012  *RADIOLOGY REPORT*  Clinical Data: Patient with history of back pain, poor venous access  and status post recently placed PICC line which is now malpositioned.  Request is made for PICC exchange.  RIGHT DOUBLE LUMEN PICC  EXCHANGE UNDER FLUOROSCOPIC  GUIDANCE  Fluoroscopy Time: 1.1 minutes.  Technique:  The existing right upper extremity PICC entry site was prepped and draped in a normal sterile  fashion.  1% lidocaine was used for local anesthesia.  Under fluoroscopic guidance the existing PICC was pulled back to the mid axillary region.  The proximal segment the PICC was then cut, and a guide wire was placed and advanced to the SVC/ right atrial junction.  The existing PICC was then removed and exchanged out over a peel-away sheath for a new 38 cm double lumen power PICC with its tip lying in the SVC/ right atrial junction.  Fluoroscopy and spot chest radiograph confirmed appropriate catheter tip placement.  The cath was then flushed, secured to the skin with Prolene sutures and covered with a sterile dressing.  Complications:  None  IMPRESSION: Successful fluoroscopic  guided exchange of existing right upper extremity double-lumen PICC for  a new double lumen power PICC.  The catheter is ready for use.  Read by: Jeananne Rama, P.A.-C   Original Report Authenticated By: Jolaine Click, M.D.    Dg Chest Port 1 View  06/29/2012  *RADIOLOGY REPORT*  Clinical Data: Evaluate PICC line placement  PORTABLE CHEST - 1 VIEW  Comparison: Portable chest x-ray of earlier today  Findings: The tip of the right PICC line still extends cephalad with the tip not visualized.  No pneumothorax is seen.  Mild bibasilar atelectasis remains and cardiomegaly is stable.  IMPRESSION: The right PICC line still extends cephalad with the tip not visualized.   Original Report Authenticated By: Dwyane Dee, M.D.    Dg Chest Port 1 View  06/29/2012  *RADIOLOGY REPORT*  Clinical Data: PICC line placement  PORTABLE CHEST - 1 VIEW  Comparison: Portable chest x-ray from earlier today  Findings: The right PICC line tip still extends cephalad presumably into the right internal jugular vein by approximately 7 cm.  There has been an increase in bibasilar linear atelectasis.  No pneumothorax is seen.  Heart size is stable.  IMPRESSION: Tip of right PICC line still extends into the right neck presumably within the right internal jugular vein as  noted above.   Original Report Authenticated By: Dwyane Dee, M.D.    Dg Chest Port 1 View  06/29/2012  *RADIOLOGY REPORT*  Clinical Data: Lumbar compression fractures and status post PICC line placement.  PORTABLE CHEST - 1 VIEW  Comparison: 09/12/2008  Findings: Frontal chest radiograph shows right upper extremity PICC line placement with the catheter extending superiorly into the right internal jugular vein.  The catheter extends for at least 5-6 cm into the jugular vein.  Lungs show stable chronic disease and bibasilar scarring.  IMPRESSION: Right PICC line tip extends superiorly in the right internal jugular vein.  Based on radiographic appearance, the catheter may be able to be redirected into the SVC by bedside retraction by approximately 6 cm and readvancement with the head turned to the left.   Original Report Authenticated By: Irish Lack, M.D.     DISCHARGE EXAMINATION: Filed Vitals:   06/30/12 1730 06/30/12 1759 06/30/12 2050 07/01/12 0505  BP:   98/53 105/64  Pulse:   78 94  Temp:   97.7 F (36.5 C) 99.4 F (37.4 C)  TempSrc:   Oral Oral  Resp:   16 18  Height:      Weight:      SpO2: 95% 93% 95% 93%   General appearance: alert, cooperative, appears stated age and no distress Resp: clear to auscultation bilaterally Cardio: Irregularly irregular. No S3S4. No rubs, bruits. GI: soft, non-tender; bowel sounds normal; no masses,  no organomegaly Extremities: extremities normal, atraumatic, no cyanosis or edema Neurologic: Alert and oriented x 3. No focal deficits  DISPOSITION: SNF for short term rehab  Discharge Orders   Future Orders Complete By Expires     Diet - low sodium heart healthy  As directed     Discharge instructions  As directed     Comments:      Please check PT/INR in 3 days. Patient is on warfarin    Increase activity slowly  As directed       Current Discharge Medication List    START taking these medications   Details  acetaminophen (TYLENOL) 325  MG tablet Take 2 tablets (650 mg total) by mouth every 6 (six) hours as needed.    feeding supplement (ENSURE COMPLETE) LIQD  Take 237 mLs by mouth 2 (two) times daily between meals.    levofloxacin (LEVAQUIN) 250 MG tablet Take 1 tablet (250 mg total) by mouth daily. For 3 days Qty: 3 tablet, Refills: 0      CONTINUE these medications which have CHANGED   Details  diazepam (VALIUM) 2 MG tablet Take 1 tablet (2 mg total) by mouth every 8 (eight) hours as needed (muscle spasms or cramping). Qty: 20 tablet, Refills: 0    traMADol (ULTRAM) 50 MG tablet Take 1 tablet (50 mg total) by mouth every 6 (six) hours as needed for pain. Qty: 15 tablet, Refills: 0      CONTINUE these medications which have NOT CHANGED   Details  atorvastatin (LIPITOR) 10 MG tablet Take 10 mg by mouth daily.    B Complex-C (B-COMPLEX WITH VITAMIN C) tablet Take 1 tablet by mouth daily.    diltiazem (CARDIZEM CD) 300 MG 24 hr capsule Take 300 mg by mouth daily.    furosemide (LASIX) 40 MG tablet Take 40 mg by mouth daily.     labetalol (NORMODYNE) 200 MG tablet Take 200 mg by mouth 2 (two) times daily.    levothyroxine (SYNTHROID, LEVOTHROID) 100 MCG tablet Take 100 mcg by mouth daily.    potassium chloride SA (K-DUR,KLOR-CON) 20 MEQ tablet Take 20 mEq by mouth daily.    warfarin (COUMADIN) 1 MG tablet Take 2-3 mg by mouth daily. 3 mg Mondays  2mg  all other days       Follow-up Information   Follow up with ROBERTS, Vernie Ammons, MD. Schedule an appointment as soon as possible for a visit in 4 weeks.   Contact information:   [address] [place] [address] [phone]       TOTAL DISCHARGE TIME: 35 mins  [facility]  [facility] Pager [phone]  07/01/2012, 9:29 AM

## 2012-07-01 NOTE — Progress Notes (Signed)
Physical Therapy Treatment Patient Details Name: Tonya Farley MRN: [id number] DOB: 1920-02-07 Today's Date: 07/01/2012 Time: 4098-1191 PT Time Calculation (min): 38 min  PT Assessment / Plan / Recommendation Comments on Treatment Session  Pt. s/p kyphoplasty on 06/30/12 at L1 and L4. Pt. in much less pain. Tolerated ambulation x 30' x 2. Pt. plans to go to [facility].    Follow Up Recommendations  SNF     Does the patient have the potential to tolerate intense rehabilitation     Barriers to Discharge        Equipment Recommendations  Rolling walker with 5" wheels    Recommendations for Other Services    Frequency Min 3X/week   Plan Discharge plan remains appropriate;Frequency remains appropriate    Precautions / Restrictions Precautions Precautions: Fall;Back Precaution Comments: instructed in back safety with bed mobility, pt incontinent of urine. Restrictions Weight Bearing Restrictions: No   Pertinent Vitals/Pain No c/o back pain.    Mobility  Bed Mobility Rolling Left: 5: Supervision;With rail Left Sidelying to Sit: 4: Min assist;HOB flat;With rails Details for Bed Mobility Assistance: extra time for getting to a sitting position, cues for back precautions. Transfers Sit to Stand: 4: Min assist;From bed;From chair/3-in-1;With armrests;From elevated surface Stand to Sit: 4: Min guard Stand Pivot Transfers: 4: Min assist Details for Transfer Assistance: VC for safety with sit/stand, use of armrests when available, Pt. neede handhold steady assist for transfer recliner to [facility] to recliner. Ambulation/Gait Ambulation/Gait Assistance: 4: Min assist Ambulation Distance (Feet): 30 Feet (30 ft) Assistive device: Rolling walker Ambulation/Gait Assistance Details: gait is slow, cues for posture. required rest and walked a second time. Gait Pattern: Step-through pattern;Trunk flexed Gait velocity: decreased.    Exercises     PT Diagnosis:    PT Problem List:   PT  Treatment Interventions:     PT Goals Acute Rehab PT Goals Pt will go Supine/Side to Sit: with modified independence PT Goal: Supine/Side to Sit - Progress: Progressing toward goal Pt will go Sit to Stand: with modified independence PT Goal: Sit to Stand - Progress: Progressing toward goal Pt will go Stand to Sit: with modified independence PT Goal: Stand to Sit - Progress: Progressing toward goal Pt will Ambulate: 51 - 150 feet;with supervision;with rolling walker PT Goal: Ambulate - Progress: Progressing toward goal Additional Goals Additional Goal #1: demo back precautions  PT Goal: Additional Goal #1 - Progress: Goal set today  Visit Information  Last PT Received On: 07/01/12 Assistance Needed: +1    Subjective Data  Subjective: I am better. My legs are so weak.   Cognition  Cognition Overall Cognitive Status: Appears within functional limits for tasks assessed/performed    Balance  Static Sitting Balance Static Sitting - Balance Support: No upper extremity supported Static Sitting - Level of Assistance: 5: Stand by assistance  End of Session PT - End of Session Equipment Utilized During Treatment: Gait belt Activity Tolerance: Patient tolerated treatment well Patient left: in chair;with call bell/phone within reach Nurse Communication: Mobility status   GP     Rada Hay 07/01/2012, 10:49 AM

## 2012-07-01 NOTE — Progress Notes (Signed)
Patient is set to discharge to [facility] today. Patient's nieces, Erlene Senters aware. [facility] called for transport. Discharge packet in [place].   Clinical Social Work Department CLINICAL SOCIAL WORK PLACEMENT NOTE 07/01/2012  Patient:  Tonya Farley, Tonya Farley  Account Number:  [id number] Admit date:  06/25/2012  Clinical Social Worker:  Doroteo Glassman  Date/time:  06/27/2012 02:55 PM  Clinical Social Work is seeking post-discharge placement for this patient at the following level of care:   SKILLED NURSING   (*CSW will update this form in Epic as items are completed)   06/27/2012  Patient/family provided with [facility] Department of Clinical Social Work's list of facilities offering this level of care within the geographic area requested by the patient (or if unable, by the patient's family).  06/27/2012  Patient/family informed of their freedom to choose among providers that offer the needed level of care, that participate in Medicare, Medicaid or managed care program needed by the patient, have an available bed and are willing to accept the patient.  06/27/2012  Patient/family informed of [facility]' ownership interest in [facility], as well as of the fact that they are under no obligation to receive care at this facility.  PASARR submitted to EDS on 06/26/2012 PASARR number received from EDS on 06/26/2012  FL2 transmitted to all facilities in geographic area requested by pt/family on  06/27/2012 FL2 transmitted to all facilities within larger geographic area on 06/27/2012  Patient informed that his/her managed care company has contracts with or will negotiate with  certain facilities, including the following:     Patient/family informed of bed offers received:  07/01/2012 Patient chooses bed at [facility] [place] Physician recommends and patient chooses bed at    [facility] [place] on  07/01/2012 Patient to be transferred to facility by [facility]  The following physician request were entered in Epic:   Additional Comments:  Unice Bailey, LCSW [facility] Clinical Social Worker cell #: [phone]

## 2012-07-01 NOTE — Progress Notes (Signed)
Subjective: Pt feeling much better. Pain is markedly improved.   Objective: Physical Exam: BP 105/64  Pulse 94  Temp(Src) 99.4 F (37.4 C) (Oral)  Resp 18  Ht 5\' 4"  (1.626 m)  Wt 158 lb 3.2 oz (71.759 kg)  BMI 27.14 kg/m2  SpO2 93% Back: puncture sites clean, NT, no hematoma   Labs: CBC  Recent Labs  06/30/12 0355 07/01/12 0545  WBC 16.2* 14.2*  HGB 11.3* 10.7*  HCT 35.2* 32.8*  PLT 342 262   BMET  Recent Labs  06/30/12 0355 07/01/12 0545  NA 138 139  K 3.9 3.7  CL 100 99  CO2 31 32  GLUCOSE 103* 114*  BUN 19 22  CREATININE 1.07 1.26*  CALCIUM 9.8 9.6   LFT No results found for this basename: PROT, ALBUMIN, AST, ALT, ALKPHOS, BILITOT, BILIDIR, IBILI, LIPASE,  in the last 72 hours PT/INR  Recent Labs  06/30/12 0355 07/01/12 0545  LABPROT 14.9 15.7*  INR 1.19 1.28     Studies/Results: Ir Kyphoplasty Or Sacroplasty  06/30/2012  *RADIOLOGY REPORT*  Clinical Data/Indication: LOW BACK PAIN IN THE ACUTE SETTING.  THE PATIENT DEVELOPED ACUTE LOW BACK PAIN TO THE WEEKS AGO UPON STANDING UPRIGHT.  SUBSEQUENT IMAGING DEMONSTRATES COMPRESSION FRACTURES AT L1 AND L4.  SHE IS TENDER TO PALPATION DIFFUSELY AT THE MIDLINE M R LUMBAR SPINE.  SHE IS NEUROLOGICALLY INTACT.  LUMBAR KYPHOPLASTY  Sedation: Versed three mg, Fentanyl 150 mg.  Total Moderate Sedation Time: 40 minutes.  Per CMS PQRS reporting requirements (PQRS Measure 24): Given the patient's age of greater than 50 and the fracture site (hip, distal radius, or spine), the patient should be tested for osteoporosis using DXA, and the appropriate treatment considered based on the DXA results.  Fluoroscopy Time: 13.2 minutes.  Procedure: The procedure, risks, benefits, and alternatives were explained to the patient. Questions regarding the procedure were encouraged and answered. The patient understands and consents to the procedure.  The low back was prepped with betadine in a sterile fashion, and a sterile drape was  applied covering the operative field. A sterile gown and sterile gloves were used for the procedure.  1% lidocaine was utilized for local anesthesia.  Under fluoroscopic guidance, the 8-gauge needle was advanced into the L1 vertebral body via a right pedicle approach.  A drill was utilized to create a channel and vertebral body.  The 2 cm balloon was then deployed into the vertebral body and insufflated to 150 pounds per square inch.  The identical procedure was performed in L4 via left pedicle.  Bone cement was then injected into the L1 vertebral body utilizing the deployment trocar.  This filled vertebral body crossing the midline and extending to the superior as well as inferior endplates.  No extra osseous cement.  The identical procedure was performed in L4.  There is primarily filling in the lower to thirds but there is some interdigitation extending to the superior endplate.  No extra osseous cement.  Findings: Imaging demonstrates L1 and L4 kyphoplasty.  Bone cement fills the L1 and L4 vertebral bodies as described above.  Complications: None.  IMPRESSION: Successful L1 and L4 kyphoplasty.   Original Report Authenticated By: Jolaine Click, M.D.    Ir Kyphoplasty Or Sacroplasty  06/30/2012  *RADIOLOGY REPORT*  Clinical Data/Indication: LOW BACK PAIN IN THE ACUTE SETTING.  THE PATIENT DEVELOPED ACUTE LOW BACK PAIN TO THE WEEKS AGO UPON STANDING UPRIGHT.  SUBSEQUENT IMAGING DEMONSTRATES COMPRESSION FRACTURES AT L1 AND L4.  SHE IS TENDER  TO PALPATION DIFFUSELY AT THE MIDLINE M R LUMBAR SPINE.  SHE IS NEUROLOGICALLY INTACT.  LUMBAR KYPHOPLASTY  Sedation: Versed three mg, Fentanyl 150 mg.  Total Moderate Sedation Time: 40 minutes.  Per CMS PQRS reporting requirements (PQRS Measure 24): Given the patient's age of greater than 50 and the fracture site (hip, distal radius, or spine), the patient should be tested for osteoporosis using DXA, and the appropriate treatment considered based on the DXA results.   Fluoroscopy Time: 13.2 minutes.  Procedure: The procedure, risks, benefits, and alternatives were explained to the patient. Questions regarding the procedure were encouraged and answered. The patient understands and consents to the procedure.  The low back was prepped with betadine in a sterile fashion, and a sterile drape was applied covering the operative field. A sterile gown and sterile gloves were used for the procedure.  1% lidocaine was utilized for local anesthesia.  Under fluoroscopic guidance, the 8-gauge needle was advanced into the L1 vertebral body via a right pedicle approach.  A drill was utilized to create a channel and vertebral body.  The 2 cm balloon was then deployed into the vertebral body and insufflated to 150 pounds per square inch.  The identical procedure was performed in L4 via left pedicle.  Bone cement was then injected into the L1 vertebral body utilizing the deployment trocar.  This filled vertebral body crossing the midline and extending to the superior as well as inferior endplates.  No extra osseous cement.  The identical procedure was performed in L4.  There is primarily filling in the lower to thirds but there is some interdigitation extending to the superior endplate.  No extra osseous cement.  Findings: Imaging demonstrates L1 and L4 kyphoplasty.  Bone cement fills the L1 and L4 vertebral bodies as described above.  Complications: None.  IMPRESSION: Successful L1 and L4 kyphoplasty.   Original Report Authenticated By: Jolaine Click, M.D.    Ir Fluoro Guide Cv Line Right  06/30/2012  *RADIOLOGY REPORT*  Clinical Data: Patient with history of back pain, poor venous access  and status post recently placed PICC line which is now malpositioned.  Request is made for PICC exchange.  RIGHT DOUBLE LUMEN PICC  EXCHANGE UNDER FLUOROSCOPIC  GUIDANCE  Fluoroscopy Time: 1.1 minutes.  Technique:  The existing right upper extremity PICC entry site was prepped and draped in a normal sterile  fashion.  1% lidocaine was used for local anesthesia.  Under fluoroscopic guidance the existing PICC was pulled back to the mid axillary region.  The proximal segment the PICC was then cut, and a guide wire was placed and advanced to the SVC/ right atrial junction.  The existing PICC was then removed and exchanged out over a peel-away sheath for a new 38 cm double lumen power PICC with its tip lying in the SVC/ right atrial junction.  Fluoroscopy and spot chest radiograph confirmed appropriate catheter tip placement.  The cath was then flushed, secured to the skin with Prolene sutures and covered with a sterile dressing.  Complications:  None  IMPRESSION: Successful fluoroscopic  guided exchange of existing right upper extremity double-lumen PICC for a new double lumen power PICC.  The catheter is ready for use.  Read by: Jeananne Rama, P.A.-C   Original Report Authenticated By: Jolaine Click, M.D.    Dg Chest Port 1 View  06/29/2012  *RADIOLOGY REPORT*  Clinical Data: Evaluate PICC line placement  PORTABLE CHEST - 1 VIEW  Comparison: Portable chest x-ray of earlier today  Findings: The tip of the right PICC line still extends cephalad with the tip not visualized.  No pneumothorax is seen.  Mild bibasilar atelectasis remains and cardiomegaly is stable.  IMPRESSION: The right PICC line still extends cephalad with the tip not visualized.   Original Report Authenticated By: Dwyane Dee, M.D.    Dg Chest Port 1 View  06/29/2012  *RADIOLOGY REPORT*  Clinical Data: PICC line placement  PORTABLE CHEST - 1 VIEW  Comparison: Portable chest x-ray from earlier today  Findings: The right PICC line tip still extends cephalad presumably into the right internal jugular vein by approximately 7 cm.  There has been an increase in bibasilar linear atelectasis.  No pneumothorax is seen.  Heart size is stable.  IMPRESSION: Tip of right PICC line still extends into the right neck presumably within the right internal jugular vein as  noted above.   Original Report Authenticated By: Dwyane Dee, M.D.    Dg Chest Port 1 View  06/29/2012  *RADIOLOGY REPORT*  Clinical Data: Lumbar compression fractures and status post PICC line placement.  PORTABLE CHEST - 1 VIEW  Comparison: 09/12/2008  Findings: Frontal chest radiograph shows right upper extremity PICC line placement with the catheter extending superiorly into the right internal jugular vein.  The catheter extends for at least 5-6 cm into the jugular vein.  Lungs show stable chronic disease and bibasilar scarring.  IMPRESSION: Right PICC line tip extends superiorly in the right internal jugular vein.  Based on radiographic appearance, the catheter may be able to be redirected into the SVC by bedside retraction by approximately 6 cm and readvancement with the head turned to the left.   Original Report Authenticated By: Irish Lack, M.D.     Assessment/Plan: S/p KP of L1 and L4 compression fractures. Good results POD #1 thus far. Discussed with pt need to use RW at all times when moving Showering ok. Will have office contact pt for follow up appointment to be seen in 2 weeks. I understand she will be getting some rehab at [facility].    LOS: 6 days    Brayton El PA-C 07/01/2012 9:32 AM

## 2012-07-29 ENCOUNTER — Ambulatory Visit
Admission: RE | Admit: 2012-07-29 | Discharge: 2012-07-29 | Disposition: A | Discharge status: Home / Self Care | Payer: Medicare Other | Source: Ambulatory Visit | Attending: Radiology | Admitting: Radiology

## 2012-07-29 DIAGNOSIS — S32040A Wedge compression fracture of fourth lumbar vertebra, initial encounter for closed fracture: Secondary | ICD-10-CM

## 2012-11-09 ENCOUNTER — Other Ambulatory Visit: Payer: Self-pay | Admitting: Internal Medicine

## 2012-11-09 DIAGNOSIS — R6881 Early satiety: Secondary | ICD-10-CM

## 2012-11-16 ENCOUNTER — Ambulatory Visit
Admission: RE | Admit: 2012-11-16 | Discharge: 2012-11-16 | Disposition: A | Discharge status: Home / Self Care | Payer: Medicare Other | Source: Ambulatory Visit | Attending: Internal Medicine | Admitting: Internal Medicine

## 2012-11-16 DIAGNOSIS — R6881 Early satiety: Secondary | ICD-10-CM

## 2012-12-25 ENCOUNTER — Ambulatory Visit (INDEPENDENT_AMBULATORY_CARE_PROVIDER_SITE_OTHER): Payer: Medicare Other | Admitting: Pharmacist

## 2012-12-25 DIAGNOSIS — I4891 Unspecified atrial fibrillation: Secondary | ICD-10-CM

## 2012-12-25 LAB — POCT INR: INR: 1.8

## 2013-01-13 ENCOUNTER — Ambulatory Visit (INDEPENDENT_AMBULATORY_CARE_PROVIDER_SITE_OTHER): Payer: Medicare Other | Admitting: Pharmacist

## 2013-01-13 DIAGNOSIS — I4891 Unspecified atrial fibrillation: Secondary | ICD-10-CM

## 2013-02-10 ENCOUNTER — Ambulatory Visit (INDEPENDENT_AMBULATORY_CARE_PROVIDER_SITE_OTHER): Payer: Medicare Other | Admitting: Pharmacist

## 2013-02-10 DIAGNOSIS — I4891 Unspecified atrial fibrillation: Secondary | ICD-10-CM

## 2013-02-10 LAB — POCT INR: INR: 2.1

## 2013-03-24 ENCOUNTER — Ambulatory Visit (INDEPENDENT_AMBULATORY_CARE_PROVIDER_SITE_OTHER): Payer: Medicare Other | Admitting: Pharmacist

## 2013-03-24 DIAGNOSIS — I4891 Unspecified atrial fibrillation: Secondary | ICD-10-CM

## 2013-03-24 LAB — POCT INR: INR: 1.9

## 2013-04-21 ENCOUNTER — Ambulatory Visit (INDEPENDENT_AMBULATORY_CARE_PROVIDER_SITE_OTHER): Payer: Medicare Other | Admitting: Pharmacist

## 2013-04-21 DIAGNOSIS — Z5181 Encounter for therapeutic drug level monitoring: Secondary | ICD-10-CM

## 2013-04-21 DIAGNOSIS — I4891 Unspecified atrial fibrillation: Secondary | ICD-10-CM

## 2013-04-21 LAB — POCT INR: INR: 2.2

## 2013-05-04 ENCOUNTER — Other Ambulatory Visit: Payer: Self-pay | Admitting: Interventional Cardiology

## 2013-05-26 ENCOUNTER — Ambulatory Visit (INDEPENDENT_AMBULATORY_CARE_PROVIDER_SITE_OTHER): Payer: Medicare Other | Admitting: Pharmacist

## 2013-05-26 DIAGNOSIS — Z5181 Encounter for therapeutic drug level monitoring: Secondary | ICD-10-CM

## 2013-05-26 DIAGNOSIS — I4891 Unspecified atrial fibrillation: Secondary | ICD-10-CM

## 2013-05-26 LAB — POCT INR: INR: 1.8

## 2013-05-26 MED ORDER — WARFARIN SODIUM 1 MG PO TABS: ORAL_TABLET | ORAL | Status: DC

## 2013-06-08 ENCOUNTER — Ambulatory Visit: Payer: Medicare Other | Admitting: Interventional Cardiology

## 2013-06-09 ENCOUNTER — Ambulatory Visit (INDEPENDENT_AMBULATORY_CARE_PROVIDER_SITE_OTHER): Payer: Medicare Other | Admitting: Pharmacist

## 2013-06-09 DIAGNOSIS — Z5181 Encounter for therapeutic drug level monitoring: Secondary | ICD-10-CM

## 2013-06-09 DIAGNOSIS — I4891 Unspecified atrial fibrillation: Secondary | ICD-10-CM

## 2013-06-09 LAB — POCT INR: INR: 2

## 2013-06-21 ENCOUNTER — Other Ambulatory Visit: Payer: Self-pay | Admitting: Interventional Cardiology

## 2013-07-02 ENCOUNTER — Ambulatory Visit (INDEPENDENT_AMBULATORY_CARE_PROVIDER_SITE_OTHER): Payer: Medicare Other | Admitting: Pharmacist

## 2013-07-02 DIAGNOSIS — Z5181 Encounter for therapeutic drug level monitoring: Secondary | ICD-10-CM

## 2013-07-02 DIAGNOSIS — I4891 Unspecified atrial fibrillation: Secondary | ICD-10-CM

## 2013-07-02 LAB — POCT INR: INR: 1.7

## 2013-07-19 ENCOUNTER — Other Ambulatory Visit: Payer: Self-pay | Admitting: Interventional Cardiology

## 2013-07-21 ENCOUNTER — Encounter: Payer: Self-pay | Admitting: Interventional Cardiology

## 2013-07-21 ENCOUNTER — Ambulatory Visit (INDEPENDENT_AMBULATORY_CARE_PROVIDER_SITE_OTHER): Payer: Medicare Other | Admitting: Interventional Cardiology

## 2013-07-21 ENCOUNTER — Ambulatory Visit (INDEPENDENT_AMBULATORY_CARE_PROVIDER_SITE_OTHER): Payer: Medicare Other | Admitting: *Deleted

## 2013-07-21 VITALS — BP 144/62 | HR 93 | Ht 64.0 in | Wt 151.0 lb

## 2013-07-21 DIAGNOSIS — E785 Hyperlipidemia, unspecified: Secondary | ICD-10-CM

## 2013-07-21 DIAGNOSIS — Z5181 Encounter for therapeutic drug level monitoring: Secondary | ICD-10-CM

## 2013-07-21 DIAGNOSIS — I4891 Unspecified atrial fibrillation: Secondary | ICD-10-CM

## 2013-07-21 DIAGNOSIS — I10 Essential (primary) hypertension: Secondary | ICD-10-CM

## 2013-07-21 LAB — POCT INR: INR: 1.7

## 2013-07-21 NOTE — Patient Instructions (Signed)
Your physician recommends that you continue on your current medications as directed. Please refer to the Current Medication list given to you today.  Your physician wants you to follow-up in: 1 year with Dr. Varanasi. You will receive a reminder letter in the mail two months in advance. If you don't receive a letter, please call our office to schedule the follow-up appointment.  

## 2013-07-21 NOTE — Progress Notes (Signed)
Patient ID: Tonya Farley, female   DOB: 12/11/1919, 80 y.o.   MRN: [id number]    [address] [place], [place]  [id number] Phone: [phone] Fax:  [phone]  Date:  07/21/2013   ID:  Tonya Farley, DOB 08/28/1919, MRN [id number]  PCP:  Lorenda PeckOBERTS, RONALD WAYNE, MD      History of Present Illness: Tonya Farley is a 80 y.o. female who has renal artery stenosis, HTN, AFib. Atrial Fibrillation F/U:  walks daily  Denies : Chest pain.  Dizziness.  Leg edema.  Orthopnea.  Palpitations.  Shortness of breath.  Syncope.  Coumadin has been difficult to regulate.   Wt Readings from Last 3 Encounters:  07/21/13 151 lb (68.493 kg)  06/25/12 158 lb 3.2 oz (71.759 kg)     Past Medical History  Diagnosis Date  . Hypertension   . Hyperlipidemia   . Thyroid disease   . Hypothyroidism   . Chronic back pain   . PONV (postoperative nausea and vomiting)   . Chronic a-fib   . A-fib     Current Outpatient Prescriptions  Medication Sig Dispense Refill  . atorvastatin (LIPITOR) 10 MG tablet Take 10 mg by mouth daily.      . B Complex-C (B-COMPLEX WITH VITAMIN C) tablet Take 1 tablet by mouth daily.      Marland Kitchen. diltiazem (CARDIZEM CD) 300 MG 24 hr capsule Take 300 mg by mouth daily.      . furosemide (LASIX) 40 MG tablet Take 40 mg by mouth daily.       Marland Kitchen. labetalol (NORMODYNE) 200 MG tablet TAKE 1 TABLET BY MOUTH TWICE DAILY  60 tablet  0  . levothyroxine (SYNTHROID, LEVOTHROID) 100 MCG tablet Take 100 mcg by mouth daily.      . potassium chloride SA (K-DUR,KLOR-CON) 20 MEQ tablet Take 20 mEq by mouth daily.      Marland Kitchen. warfarin (COUMADIN) 1 MG tablet Take as directed by coumadin clinic  75 tablet  5   No current facility-administered medications for this visit.    Allergies:    Allergies  Allergen Reactions  . Penicillins Anaphylaxis and Swelling  . Sulfa Antibiotics Anaphylaxis and Swelling    Social History:  The patient  reports that she has never smoked. She  has never used smokeless tobacco. She reports that she does not drink alcohol or use illicit drugs.   Family History:  The patient's family history includes Heart disease in her sister and sister.   ROS:  Please see the history of present illness.  No nausea, vomiting.  No fevers, chills.  No focal weakness.  No dysuria. Leg weakness.     All other systems reviewed and negative.   PHYSICAL EXAM: VS:  BP 144/62  Pulse 93  Ht 5\' 4"  (1.626 m)  Wt 151 lb (68.493 kg)  BMI 25.91 kg/m2 Well nourished, well developed, in no acute distress HEENT: normal Neck: no JVD, no carotid bruits Cardiac:  normal S1, S2; irreguarly irregularly Lungs:  clear to auscultation bilaterally, no wheezing, rhonchi or rales Abd: soft, nontender, no hepatomegaly Ext: tr ankle edema Skin: warm and dry Neuro:   no focal abnormalities noted  EKG: AFib, rate controlled.  IVCD     ASSESSMENT AND PLAN:  Atrial fibrillation  Continue Cardizem CD Capsule Extended Release 24 Hour, 300 MG, 1 capsule, Orally, Once a day Notes: Rate controlled.    2. Atherosclerosis of renal artery  Continue Atorvastatin Calcium Tablet, 10  MG, 1 tablet, Orally, Once a day Notes: Mild renal insufficiency in 2013. Stable. No indication for renal angiography.    3. Encounter for long-term (current) use of anticoagulants  Continue Warfarin Sodium Tablet, 1 MG, per pharmD, Orally, 2 mg qd except 1 mg M/F Notes: INR to be checked and coumadin dose to be adjusted.    4. HTN  Continue Labetalol HCl Tablet, 200 Milligram, 1 tablet, Orally, Twice a day Notes: Controlled today.    Follow Up 1 year     Signed, Fredric MareJay S. Metzli Pollick, MD, [facility] Hospital[facility]ACC 07/21/2013 12:38 PM

## 2013-08-06 ENCOUNTER — Ambulatory Visit (INDEPENDENT_AMBULATORY_CARE_PROVIDER_SITE_OTHER): Payer: Medicare Other | Admitting: *Deleted

## 2013-08-06 DIAGNOSIS — I4891 Unspecified atrial fibrillation: Secondary | ICD-10-CM

## 2013-08-06 DIAGNOSIS — Z5181 Encounter for therapeutic drug level monitoring: Secondary | ICD-10-CM

## 2013-08-06 LAB — POCT INR: INR: 2.6

## 2013-08-18 ENCOUNTER — Other Ambulatory Visit: Payer: Self-pay | Admitting: Interventional Cardiology

## 2013-08-27 ENCOUNTER — Ambulatory Visit (INDEPENDENT_AMBULATORY_CARE_PROVIDER_SITE_OTHER): Payer: Medicare Other | Admitting: *Deleted

## 2013-08-27 DIAGNOSIS — I4891 Unspecified atrial fibrillation: Secondary | ICD-10-CM

## 2013-08-27 DIAGNOSIS — Z5181 Encounter for therapeutic drug level monitoring: Secondary | ICD-10-CM

## 2013-08-27 LAB — POCT INR: INR: 2.5

## 2013-09-22 ENCOUNTER — Ambulatory Visit (INDEPENDENT_AMBULATORY_CARE_PROVIDER_SITE_OTHER): Payer: Medicare Other | Admitting: *Deleted

## 2013-09-22 DIAGNOSIS — I4891 Unspecified atrial fibrillation: Secondary | ICD-10-CM

## 2013-09-22 DIAGNOSIS — Z5181 Encounter for therapeutic drug level monitoring: Secondary | ICD-10-CM

## 2013-09-22 LAB — POCT INR: INR: 1.7

## 2013-10-11 ENCOUNTER — Ambulatory Visit (INDEPENDENT_AMBULATORY_CARE_PROVIDER_SITE_OTHER): Payer: Medicare Other

## 2013-10-11 DIAGNOSIS — Z5181 Encounter for therapeutic drug level monitoring: Secondary | ICD-10-CM

## 2013-10-11 DIAGNOSIS — I4891 Unspecified atrial fibrillation: Secondary | ICD-10-CM

## 2013-10-11 LAB — POCT INR: INR: 2.5

## 2013-11-10 ENCOUNTER — Ambulatory Visit (INDEPENDENT_AMBULATORY_CARE_PROVIDER_SITE_OTHER): Payer: Medicare Other | Admitting: *Deleted

## 2013-11-10 DIAGNOSIS — I4891 Unspecified atrial fibrillation: Secondary | ICD-10-CM

## 2013-11-10 DIAGNOSIS — Z5181 Encounter for therapeutic drug level monitoring: Secondary | ICD-10-CM

## 2013-11-10 LAB — POCT INR: INR: 2.2

## 2013-12-09 ENCOUNTER — Ambulatory Visit (INDEPENDENT_AMBULATORY_CARE_PROVIDER_SITE_OTHER): Payer: Medicare Other | Admitting: *Deleted

## 2013-12-09 DIAGNOSIS — Z5181 Encounter for therapeutic drug level monitoring: Secondary | ICD-10-CM

## 2013-12-09 DIAGNOSIS — I4891 Unspecified atrial fibrillation: Secondary | ICD-10-CM

## 2013-12-09 LAB — POCT INR: INR: 3

## 2013-12-09 MED ORDER — WARFARIN SODIUM 1 MG PO TABS: ORAL_TABLET | ORAL | Status: DC

## 2014-01-20 ENCOUNTER — Ambulatory Visit (INDEPENDENT_AMBULATORY_CARE_PROVIDER_SITE_OTHER): Payer: Medicare Other | Admitting: *Deleted

## 2014-01-20 DIAGNOSIS — Z5181 Encounter for therapeutic drug level monitoring: Secondary | ICD-10-CM

## 2014-01-20 DIAGNOSIS — I4891 Unspecified atrial fibrillation: Secondary | ICD-10-CM

## 2014-01-20 LAB — POCT INR: INR: 2.5

## 2014-02-15 ENCOUNTER — Other Ambulatory Visit: Payer: Self-pay | Admitting: Interventional Cardiology

## 2014-02-20 ENCOUNTER — Emergency Department (HOSPITAL_COMMUNITY)
Admission: EM | Admit: 2014-02-20 | Discharge: 2014-02-20 | Disposition: A | Discharge status: Home / Self Care | Payer: Medicare Other | Attending: Emergency Medicine | Admitting: Emergency Medicine

## 2014-02-20 ENCOUNTER — Emergency Department (HOSPITAL_COMMUNITY): Payer: Medicare Other

## 2014-02-20 ENCOUNTER — Encounter (HOSPITAL_COMMUNITY): Payer: Self-pay | Admitting: Emergency Medicine

## 2014-02-20 DIAGNOSIS — E039 Hypothyroidism, unspecified: Secondary | ICD-10-CM | POA: Insufficient documentation

## 2014-02-20 DIAGNOSIS — I4891 Unspecified atrial fibrillation: Secondary | ICD-10-CM | POA: Diagnosis not present

## 2014-02-20 DIAGNOSIS — R42 Dizziness and giddiness: Secondary | ICD-10-CM | POA: Diagnosis not present

## 2014-02-20 DIAGNOSIS — E785 Hyperlipidemia, unspecified: Secondary | ICD-10-CM | POA: Diagnosis not present

## 2014-02-20 DIAGNOSIS — J189 Pneumonia, unspecified organism: Secondary | ICD-10-CM

## 2014-02-20 DIAGNOSIS — Z880 Allergy status to penicillin: Secondary | ICD-10-CM | POA: Insufficient documentation

## 2014-02-20 DIAGNOSIS — I10 Essential (primary) hypertension: Secondary | ICD-10-CM | POA: Insufficient documentation

## 2014-02-20 DIAGNOSIS — M79604 Pain in right leg: Secondary | ICD-10-CM | POA: Diagnosis present

## 2014-02-20 DIAGNOSIS — R002 Palpitations: Secondary | ICD-10-CM

## 2014-02-20 DIAGNOSIS — D649 Anemia, unspecified: Secondary | ICD-10-CM | POA: Diagnosis not present

## 2014-02-20 DIAGNOSIS — J159 Unspecified bacterial pneumonia: Secondary | ICD-10-CM | POA: Diagnosis not present

## 2014-02-20 DIAGNOSIS — Z79899 Other long term (current) drug therapy: Secondary | ICD-10-CM | POA: Insufficient documentation

## 2014-02-20 LAB — CBC WITH DIFFERENTIAL/PLATELET
Basophils Absolute: 0 10*3/uL (ref 0.0–0.1)
Basophils Relative: 0 % (ref 0–1)
EOS ABS: 0.1 10*3/uL (ref 0.0–0.7)
EOS PCT: 1 % (ref 0–5)
HEMATOCRIT: 36.1 % (ref 36.0–46.0)
Hemoglobin: 11.7 g/dL — ABNORMAL LOW (ref 12.0–15.0)
LYMPHS ABS: 1 10*3/uL (ref 0.7–4.0)
LYMPHS PCT: 8 % — AB (ref 12–46)
MCH: 28.4 pg (ref 26.0–34.0)
MCHC: 32.4 g/dL (ref 30.0–36.0)
MCV: 87.6 fL (ref 78.0–100.0)
MONO ABS: 0.8 10*3/uL (ref 0.1–1.0)
MONOS PCT: 6 % (ref 3–12)
Neutro Abs: 10.7 10*3/uL — ABNORMAL HIGH (ref 1.7–7.7)
Neutrophils Relative %: 85 % — ABNORMAL HIGH (ref 43–77)
Platelets: 258 10*3/uL (ref 150–400)
RBC: 4.12 MIL/uL (ref 3.87–5.11)
RDW: 15.1 % (ref 11.5–15.5)
WBC: 12.6 10*3/uL — AB (ref 4.0–10.5)

## 2014-02-20 LAB — URINALYSIS, ROUTINE W REFLEX MICROSCOPIC
Bilirubin Urine: NEGATIVE
Glucose, UA: NEGATIVE mg/dL
Hgb urine dipstick: NEGATIVE
KETONES UR: NEGATIVE mg/dL
LEUKOCYTES UA: NEGATIVE
NITRITE: NEGATIVE
PH: 7 (ref 5.0–8.0)
Protein, ur: 30 mg/dL — AB
SPECIFIC GRAVITY, URINE: 1.016 (ref 1.005–1.030)
Urobilinogen, UA: 1 mg/dL (ref 0.0–1.0)

## 2014-02-20 LAB — BASIC METABOLIC PANEL
Anion gap: 13 (ref 5–15)
BUN: 17 mg/dL (ref 6–23)
CALCIUM: 9.5 mg/dL (ref 8.4–10.5)
CO2: 28 meq/L (ref 19–32)
CREATININE: 1.11 mg/dL — AB (ref 0.50–1.10)
Chloride: 102 mEq/L (ref 96–112)
GFR calc Af Amer: 48 mL/min — ABNORMAL LOW (ref >90)
GFR calc non Af Amer: 42 mL/min — ABNORMAL LOW (ref >90)
GLUCOSE: 122 mg/dL — AB (ref 70–99)
Potassium: 3.7 mEq/L (ref 3.7–5.3)
Sodium: 143 mEq/L (ref 137–147)

## 2014-02-20 LAB — PROTIME-INR
INR: 2.88 — AB (ref 0.00–1.49)
Prothrombin Time: 30.4 seconds — ABNORMAL HIGH (ref 11.6–15.2)

## 2014-02-20 LAB — I-STAT TROPONIN, ED: TROPONIN I, POC: 0 ng/mL (ref 0.00–0.08)

## 2014-02-20 MED ORDER — SODIUM CHLORIDE 0.9 % IV BOLUS (SEPSIS)
250.0000 mL | Freq: Once | INTRAVENOUS | Status: AC
  Administered 2014-02-20: 250 mL via INTRAVENOUS

## 2014-02-20 MED ORDER — AZITHROMYCIN 250 MG PO TABS: 250.0000 mg | ORAL_TABLET | Freq: Every day | ORAL | Status: DC

## 2014-02-20 MED ORDER — DILTIAZEM HCL ER COATED BEADS 300 MG PO CP24
300.0000 mg | ORAL_CAPSULE | Freq: Every day | ORAL | Status: DC
  Administered 2014-02-20: 300 mg via ORAL
  Filled 2014-02-20: qty 1

## 2014-02-20 NOTE — Discharge Instructions (Signed)
Atrial Fibrillation °Atrial fibrillation is a type of irregular heart rhythm (arrhythmia). During atrial fibrillation, the upper chambers of the heart (atria) quiver continuously in a chaotic pattern. This causes an irregular and often rapid heart rate.  °Atrial fibrillation is the result of the heart becoming overloaded with disorganized signals that tell it to beat. These signals are normally released one at a time by a part of the right atrium called the sinoatrial node. They then travel from the atria to the lower chambers of the heart (ventricles), causing the atria and ventricles to contract and pump blood as they pass. In atrial fibrillation, parts of the atria outside of the sinoatrial node also release these signals. This results in two problems. First, the atria receive so many signals that they do not have time to fully contract. Second, the ventricles, which can only receive one signal at a time, beat irregularly and out of rhythm with the atria.  °There are three types of atrial fibrillation:  °· Paroxysmal. Paroxysmal atrial fibrillation starts suddenly and stops on its own within a week. °· Persistent. Persistent atrial fibrillation lasts for more than a week. It may stop on its own or with treatment. °· Permanent. Permanent atrial fibrillation does not go away. Episodes of atrial fibrillation may lead to permanent atrial fibrillation. °Atrial fibrillation can prevent your heart from pumping blood normally. It increases your risk of stroke and can lead to heart failure.  °CAUSES  °· Heart conditions, including a heart attack, heart failure, coronary artery disease, and heart valve conditions.   °· Inflammation of the sac that surrounds the heart (pericarditis). °· Blockage of an artery in the lungs (pulmonary embolism). °· Pneumonia or other infections. °· Chronic lung disease. °· Thyroid problems, especially if the thyroid is overactive (hyperthyroidism). °· Caffeine, excessive alcohol use, and use  of some illegal drugs.   °· Use of some medicines, including certain decongestants and diet pills. °· Heart surgery.   °· Birth defects.   °Sometimes, no cause can be found. When this happens, the atrial fibrillation is called lone atrial fibrillation. The risk of complications from atrial fibrillation increases if you have lone atrial fibrillation and you are age 60 years or older. °RISK FACTORS °· Heart failure. °· Coronary artery disease. °· Diabetes mellitus.   °· High blood pressure (hypertension).   °· Obesity.   °· Other arrhythmias.   °· Increased age. °SIGNS AND SYMPTOMS  °· A feeling that your heart is beating rapidly or irregularly.   °· A feeling of discomfort or pain in your chest.   °· Shortness of breath.   °· Sudden light-headedness or weakness.   °· Getting tired easily when exercising.   °· Urinating more often than normal (mainly when atrial fibrillation first begins).   °In paroxysmal atrial fibrillation, symptoms may start and suddenly stop. °DIAGNOSIS  °Your health care provider may be able to detect atrial fibrillation when taking your pulse. Your health care provider may have you take a test called an ambulatory electrocardiogram (ECG). An ECG records your heartbeat patterns over a 24-hour period. You may also have other tests, such as: °· Transthoracic echocardiogram (TTE). During echocardiography, sound waves are used to evaluate how blood flows through your heart. °· Transesophageal echocardiogram (TEE). °· Stress test. There is more than one type of stress test. If a stress test is needed, ask your health care provider about which type is best for you. °· Chest X-ray exam. °· Blood tests. °· Computed tomography (CT). °TREATMENT  °Treatment may include: °· Treating any underlying conditions. For example, if you   have an overactive thyroid, treating the condition may correct atrial fibrillation.  Taking medicine. Medicines may be given to control a rapid heart rate or to prevent blood  clots, heart failure, or a stroke.  Having a procedure to correct the rhythm of the heart:  Electrical cardioversion. During electrical cardioversion, a controlled, low-energy shock is delivered to the heart through your skin. If you have chest pain, very low blood pressure, or sudden heart failure, this procedure may need to be done as an emergency.  Catheter ablation. During this procedure, heart tissues that send the signals that cause atrial fibrillation are destroyed.  Surgical ablation. During this surgery, thin lines of heart tissue that carry the abnormal signals are destroyed. This procedure can either be an open-heart surgery or a minimally invasive surgery. With the minimally invasive surgery, small cuts are made to access the heart instead of a large opening.  Pulmonary venous isolation. During this surgery, tissue around the veins that carry blood from the lungs (pulmonary veins) is destroyed. This tissue is thought to carry the abnormal signals. HOME CARE INSTRUCTIONS   Take medicines only as directed by your health care provider. Some medicines can make atrial fibrillation worse or recur.  If blood thinners were prescribed by your health care provider, take them exactly as directed. Too much blood-thinning medicine can cause bleeding. If you take too little, you will not have the needed protection against stroke and other problems.  Perform blood tests at home if directed by your health care provider. Perform blood tests exactly as directed.  Quit smoking if you smoke.  Do not drink alcohol.  Do not drink caffeinated beverages such as coffee, soda, and some teas. You may drink decaffeinated coffee, soda, or tea.   Maintain a healthy weight.Do not use diet pills unless your health care provider approves. They may make heart problems worse.   Follow diet instructions as directed by your health care provider.  Exercise regularly as directed by your health care  provider.  Keep all follow-up visits as directed by your health care provider. This is important. PREVENTION  The following substances can cause atrial fibrillation to recur:   Caffeinated beverages.  Alcohol.  Certain medicines, especially those used for breathing problems.  Certain herbs and herbal medicines, such as those containing ephedra or ginseng.  Illegal drugs, such as cocaine and amphetamines. Sometimes medicines are given to prevent atrial fibrillation from recurring. Proper treatment of any underlying condition is also important in helping prevent recurrence.  SEEK MEDICAL CARE IF:  You notice a change in the rate, rhythm, or strength of your heartbeat.  You suddenly begin urinating more frequently.  You tire more easily when exerting yourself or exercising. SEEK IMMEDIATE MEDICAL CARE IF:   You have chest pain, abdominal pain, sweating, or weakness.  You feel nauseous.  You have shortness of breath.  You suddenly have swollen feet and ankles.  You feel dizzy.  Your face or limbs feel numb or weak.  You have a change in your vision or speech. MAKE SURE YOU:   Understand these instructions.  Will watch your condition.  Will get help right away if you are not doing well or get worse. Document Released: 03/11/2005 Document Revised: 07/26/2013 Document Reviewed: 04/21/2012 [facility] Patient Information 2015 [place]. This information is not intended to replace advice given to you by your health care provider. Make sure you discuss any questions you have with your health care provider.  Pneumonia Pneumonia is an infection  of the lungs.  CAUSES Pneumonia may be caused by bacteria or a virus. Usually, these infections are caused by breathing infectious particles into the lungs (respiratory tract). SIGNS AND SYMPTOMS   Cough.  Fever.  Chest pain.  Increased rate of breathing.  Wheezing.  Mucus production. DIAGNOSIS  If you have the common  symptoms of pneumonia, your health care provider will typically confirm the diagnosis with a chest X-ray. The X-ray will show an abnormality in the lung (pulmonary infiltrate) if you have pneumonia. Other tests of your blood, urine, or sputum may be done to find the specific cause of your pneumonia. Your health care provider may also do tests (blood gases or pulse oximetry) to see how well your lungs are working. TREATMENT  Some forms of pneumonia may be spread to other people when you cough or sneeze. You may be asked to wear a mask before and during your exam. Pneumonia that is caused by bacteria is treated with antibiotic medicine. Pneumonia that is caused by the influenza virus may be treated with an antiviral medicine. Most other viral infections must run their course. These infections will not respond to antibiotics.  HOME CARE INSTRUCTIONS   Cough suppressants may be used if you are losing too much rest. However, coughing protects you by clearing your lungs. You should avoid using cough suppressants if you can.  Your health care provider may have prescribed medicine if he or she thinks your pneumonia is caused by bacteria or influenza. Finish your medicine even if you start to feel better.  Your health care provider may also prescribe an expectorant. This loosens the mucus to be coughed up.  Take medicines only as directed by your health care provider.  Do not smoke. Smoking is a common cause of bronchitis and can contribute to pneumonia. If you are a smoker and continue to smoke, your cough may last several weeks after your pneumonia has cleared.  A cold steam vaporizer or humidifier in your room or home may help loosen mucus.  Coughing is often worse at night. Sleeping in a semi-upright position in a recliner or using a couple pillows under your head will help with this.  Get rest as you feel it is needed. Your body will usually let you know when you need to rest. PREVENTION A  pneumococcal shot (vaccine) is available to prevent a common bacterial cause of pneumonia. This is usually suggested for:  People over 78 years old.  Patients on chemotherapy.  People with chronic lung problems, such as bronchitis or emphysema.  People with immune system problems. If you are over 65 or have a high risk condition, you may receive the pneumococcal vaccine if you have not received it before. In some countries, a routine influenza vaccine is also recommended. This vaccine can help prevent some cases of pneumonia.You may be offered the influenza vaccine as part of your care. If you smoke, it is time to quit. You may receive instructions on how to stop smoking. Your health care provider can provide medicines and counseling to help you quit. SEEK MEDICAL CARE IF: You have a fever. SEEK IMMEDIATE MEDICAL CARE IF:   Your illness becomes worse. This is especially true if you are elderly or weakened from any other disease.  You cannot control your cough with suppressants and are losing sleep.  You begin coughing up blood.  You develop pain which is getting worse or is uncontrolled with medicines.  Any of the symptoms which initially brought you  in for treatment are getting worse rather than better.  You develop shortness of breath or chest pain. MAKE SURE YOU:   Understand these instructions.  Will watch your condition.  Will get help right away if you are not doing well or get worse. Document Released: 03/11/2005 Document Revised: 07/26/2013 Document Reviewed: 05/31/2010 [facility] Patient Information 2015 [place], [place]. This information is not intended to replace advice given to you by your health care provider. Make sure you discuss any questions you have with your health care provider.

## 2014-02-20 NOTE — ED Provider Notes (Signed)
CSN: [id number]     Arrival date & time 02/20/14  [id number] History   First MD Initiated Contact with Patient 02/20/14 0820     Chief Complaint  Patient presents with  . Leg Pain   HPI  Patient is a 80 year old female with past medical history of hypertension, hyperlipidemia, chronic A. fib currently on Coumadin therapy, and hypothyroidism who presents to the emergency room for weakness, palpitations, and right leg pain. Patient states that over the past 2 days she feels like she has had increasing weakness. She states that regular activities feel slightly more difficult than normal. Last night she became concerned when she started feeling palpitations like her heart was racing. She spoke with her sister and decided to see if they would go away. She woke up this morning with pain behind her right calf that lasted for a few seconds and then went away. She has continued to feel weak today. She also intermittently feels the palpitations. She states she has been taking her medicine as prescribed, but has not taken anything this morning as she came here via ambulance. Per EMS the patient was in atrial fibrillation with a rate varying anywhere from 120-150. Patient did not appear uncomfortable per EMS during transport. Patient denies any chest pain, shortness of breath, PND, orthopnea, leg swelling. Patient is followed by Dr. Abigail MiyamotoVaranessi in the office for her atrial fibrillation.  Past Medical History  Diagnosis Date  . Hypertension   . Hyperlipidemia   . Thyroid disease   . Hypothyroidism   . Chronic back pain   . PONV (postoperative nausea and vomiting)   . Chronic a-fib   . A-fib    Past Surgical History  Procedure Laterality Date  . Appendectomy    . Tonsillectomy    . Cataracts      bilateral  . Eye surgery      left eye "hole"   Family History  Problem Relation Age of Onset  . Heart disease Sister   . Heart disease Sister    History  Substance Use Topics  . Smoking status: Never Smoker    . Smokeless tobacco: Never Used  . Alcohol Use: No   OB History    No data available     Review of Systems  Constitutional: Negative for fever, chills and fatigue.  Respiratory: Negative for cough, chest tightness and shortness of breath.   Cardiovascular: Positive for palpitations. Negative for chest pain and leg swelling.  Gastrointestinal: Negative for nausea, vomiting, abdominal pain, diarrhea, constipation, blood in stool and anal bleeding.  Genitourinary: Negative for dysuria, urgency, frequency, hematuria and difficulty urinating.  Musculoskeletal: Negative for gait problem.  Skin: Negative for rash.  Neurological: Positive for dizziness and weakness. Negative for numbness and headaches.  All other systems reviewed and are negative.     Allergies  Penicillins and Sulfa antibiotics  Home Medications   Prior to Admission medications   Medication Sig Start Date End Date Taking? Authorizing Provider  atorvastatin (LIPITOR) 10 MG tablet Take 10 mg by mouth daily.   Yes Historical Provider, MD  B Complex-C (B-COMPLEX WITH VITAMIN C) tablet Take 1 tablet by mouth daily.   Yes Historical Provider, MD  diltiazem (CARDIZEM CD) 300 MG 24 hr capsule Take 300 mg by mouth daily.   Yes Historical Provider, MD  furosemide (LASIX) 40 MG tablet Take 40 mg by mouth daily.    Yes Historical Provider, MD  labetalol (NORMODYNE) 200 MG tablet TAKE 1 TABLET BY MOUTH TWICE  DAILY 02/18/14  Yes Corky Crafts, MD  latanoprost (XALATAN) 0.005 % ophthalmic solution Place 1 drop into both eyes at bedtime. 01/10/14  Yes Historical Provider, MD  levothyroxine (SYNTHROID, LEVOTHROID) 100 MCG tablet Take 100 mcg by mouth daily.   Yes Historical Provider, MD  potassium chloride SA (K-DUR,KLOR-CON) 20 MEQ tablet Take 20 mEq by mouth daily.   Yes Historical Provider, MD  timolol (TIMOPTIC) 0.5 % ophthalmic solution Place 1 drop into both eyes every morning.  12/28/13  Yes Historical Provider, MD   warfarin (COUMADIN) 1 MG tablet Take 2-3 mg by mouth daily at 6 PM. Pt takes 2 mg on sun, tue, and 3 mg on mon, wed, thu, fri, sat   Yes Historical Provider, MD  azithromycin (ZITHROMAX) 250 MG tablet Take 1 tablet (250 mg total) by mouth daily. Take first 2 tablets together, then 1 every day until finished. 02/20/14   Airanna Partin A Forcucci, PA-C   BP 145/42 mmHg  Pulse 105  Temp(Src) 98.1 F (36.7 C) (Oral)  Resp 21  SpO2 96% Physical Exam  Constitutional: She is oriented to person, place, and time. She appears well-developed and well-nourished. No distress.  HENT:  Head: Normocephalic and atraumatic.  Mouth/Throat: Oropharynx is clear and moist. No oropharyngeal exudate.  Eyes: Conjunctivae and EOM are normal. Pupils are equal, round, and reactive to light. No scleral icterus.  Neck: Normal range of motion. Neck supple. No JVD present. No thyromegaly present.  Cardiovascular: Normal heart sounds and intact distal pulses.  An irregularly irregular rhythm present. Tachycardia present.  Exam reveals no gallop and no friction rub.   No murmur heard. Pulmonary/Chest: Effort normal and breath sounds normal. No respiratory distress. She has no wheezes. She has no rales. She exhibits no tenderness.  Abdominal: Soft. Bowel sounds are normal. She exhibits no distension and no mass. There is no tenderness. There is no rebound and no guarding.  Musculoskeletal: Normal range of motion.  Lymphadenopathy:    She has no cervical adenopathy.  Neurological: She is alert and oriented to person, place, and time. She has normal strength. No cranial nerve deficit or sensory deficit. Coordination normal.  Skin: Skin is warm and dry. She is not diaphoretic.  Psychiatric: She has a normal mood and affect. Her behavior is normal. Judgment and thought content normal.  Nursing note and vitals reviewed.   ED Course  Procedures (including critical care time) Labs Review Labs Reviewed  CBC WITH DIFFERENTIAL -  Abnormal; Notable for the following:    WBC 12.6 (*)    Hemoglobin 11.7 (*)    Neutrophils Relative % 85 (*)    Neutro Abs 10.7 (*)    Lymphocytes Relative 8 (*)    All other components within normal limits  BASIC METABOLIC PANEL - Abnormal; Notable for the following:    Glucose, Bld 122 (*)    Creatinine, Ser 1.11 (*)    GFR calc non Af Amer 42 (*)    GFR calc Af Amer 48 (*)    All other components within normal limits  PROTIME-INR - Abnormal; Notable for the following:    Prothrombin Time 30.4 (*)    INR 2.88 (*)    All other components within normal limits  URINALYSIS, ROUTINE W REFLEX MICROSCOPIC - Abnormal; Notable for the following:    Protein, ur 30 (*)    All other components within normal limits  URINE MICROSCOPIC-ADD ON  Rosezena Sensor, ED    Imaging Review Dg Chest 2 View  02/20/2014   CLINICAL DATA:  Sharp pain in the right calf.  Atrial fibrillation.  EXAM: CHEST  2 VIEW  COMPARISON:  06/29/2012  FINDINGS: There are densities at both lung bases, left side greater than right. Suspect small pleural effusions and cannot exclude airspace disease at the left lung base. Chronic linear densities at the left lung base. Atherosclerotic calcifications of the aortic arch. Heart size is within normal limits. Previous kyphoplasty at L1. There appears to be a new fracture of the T12 inferior endplate. The age of these new bone findings are unknown.  IMPRESSION: Bibasilar chest densities are compatible with small pleural effusions. Possible left basilar airspace disease.  Fracture of the T12 vertebral body adjacent to the L1 kyphoplasty. Recommend further characterization with dedicated lumbar spine images.   Electronically Signed   By: Richarda OverlieAdam  Henn M.D.   On: 02/20/2014 10:06     EKG Interpretation   Date/Time:  Sunday February 20 2014 08:30:14 EST Ventricular Rate:  108 PR Interval:    QRS Duration: 110 QT Interval:  348 QTC Calculation: 466 R Axis:   -84 Text Interpretation:   Atrial fibrillation Ventricular premature complex  Left anterior fascicular block Consider anterior infarct Confirmed by  POLLINA  MD, CHRISTOPHER [phone]([id number]) on 02/20/2014 8:38:35 AM      MDM   Final diagnoses:  Palpitations  Atrial fibrillation with RVR  CAP (community acquired pneumonia)  Anemia, unspecified anemia type   Patient is a 80 year old female who presents to the emergency room for evaluation of weakness and palpitations. Physical exam reveals an irregularly irregular heartbeat with rapid ventricular rate seen on monitors. Patient is neurologically intact. CBC reveals mild leukocytosis and mild anemia which is at baseline. BMP is unremarkable. I-STAT troponin is negative. PT/INR is within therapeutic range. UA is negative. Chest x-ray reveals possible infiltrate in the left lower lobe with small bilateral pleural effusions. EKG reveals A. fib with RVR. Patient was treated here with home medicine with good relief of atrial fibrillation with RVR. Suspect that there may be possible pneumonia and left lower lobe which is contributing to the patient's weakness. Will discharge the patient home with azithromycin to cover for CAP.  Patient to follow up with her cardiologist and PCP next week. Patient is to return for worsening shortness of breath, chest pain, weakness, or any other concerning symptoms. She states understanding and agreement at this time. Patient is stable for discharge. Patient was seen by and discussed with Dr. Blinda LeatherwoodPollina.      Eben Burowourtney A Forcucci, PA-C 02/20/14 1245  Gilda Creasehristopher J. Pollina, MD 02/20/14 1246

## 2014-02-20 NOTE — ED Notes (Signed)
MD Pollina at bedside. 

## 2014-02-20 NOTE — ED Notes (Addendum)
Pt from home c/o of "sharp pain in RT calf. Pt also c/o of generalized weakness and "heart racing" intermittently. Pt has a hx of Afib. EMS reports HR fluctuated between 112-148. Pt takes Coumadin daily but denies taking any meds this morning. Pt denies, CP, N/V, SOB, dizziness. NAD noted at this time. VSS.

## 2014-02-20 NOTE — ED Notes (Signed)
[facility] p[facility] [phone]

## 2014-02-20 NOTE — ED Provider Notes (Signed)
Patient presented to the ER with calf pain. Patient reports that she woke up with a sharp pain in the right calf. Pain lasted for several minutes and then resolved. She is brought to the ER by ambulance. EMS has noted atrial fibrillation with a slightly rapid ventricular response. Patient does have a history of A. fib. She does endorse some episodes of racing heartbeat. She has not taken her morning medicines.  Face to face Exam: HEENT - PERRLA Lungs - CTAB Heart - irregularly irregular, borderline tachycardia, no M/R/G Abd - S/NT/ND Neuro - alert, oriented x3  Plan: Patient with pain in her right leg which lasted only a few minutes and resolved. No concern for DVT. Examination unremarkable of the leg. She has atrial fibrillation chronically. She is exhibiting a slightly rapid ventricular response this morning. This is likely secondary to not taking her morning meds. Will give her a.m. meds and if she obtains rate control, is appropriate for discharge. Check INR to ensure that she is therapeutic.   Gilda Creasehristopher J. Pollina, MD 02/20/14 [phone]

## 2014-02-23 ENCOUNTER — Ambulatory Visit (INDEPENDENT_AMBULATORY_CARE_PROVIDER_SITE_OTHER): Payer: Medicare Other | Admitting: *Deleted

## 2014-02-23 ENCOUNTER — Encounter: Payer: Self-pay | Admitting: Interventional Cardiology

## 2014-02-23 ENCOUNTER — Ambulatory Visit (INDEPENDENT_AMBULATORY_CARE_PROVIDER_SITE_OTHER): Payer: Medicare Other | Admitting: Interventional Cardiology

## 2014-02-23 VITALS — BP 152/74 | HR 89 | Ht 64.0 in | Wt 151.8 lb

## 2014-02-23 DIAGNOSIS — Z5181 Encounter for therapeutic drug level monitoring: Secondary | ICD-10-CM | POA: Diagnosis not present

## 2014-02-23 DIAGNOSIS — I4891 Unspecified atrial fibrillation: Secondary | ICD-10-CM | POA: Diagnosis not present

## 2014-02-23 DIAGNOSIS — I10 Essential (primary) hypertension: Secondary | ICD-10-CM

## 2014-02-23 LAB — POCT INR: INR: 4.9

## 2014-02-23 MED ORDER — DILTIAZEM HCL ER COATED BEADS 360 MG PO CP24: 360.0000 mg | ORAL_CAPSULE | Freq: Every day | ORAL | Status: DC

## 2014-02-23 NOTE — Progress Notes (Signed)
Patient ID: Tonya Farley, female   DOB: 10/14/1919, 80 y.o.   MRN: [id number] Patient ID: Tonya Farley, female   DOB: 02/03/1920, 80 y.o.   MRN: [id number]    [address] [place], [place]  [address] Phone: [phone] Fax:  [phone]) [phone]  Date:  02/23/2014   ID:  Tonya Farley, DOB 05/28/1919, MRN [id number]  PCP:  Lorenda PeckOBERTS, RONALD WAYNE, MD      History of Present Illness: Tonya Farley is a 80 y.o. female who has renal artery stenosis, HTN, AFib. Atrial Fibrillation F/U:  walks less  Denies : Chest pain.  Dizziness.  Leg edema.  Orthopnea. .  Shortness of breath.  Syncope.  Coumadin has been difficult to regulate.  A few days ago, she woke up with palpitations.  SHe went to the ER.  She was also treated for calf pain.  THere was low concern for DVT.  Since that time, she has felt well.  She was just given her morning meds earlier.  Also given a Zpack.   Wt Readings from Last 3 Encounters:  02/23/14 151 lb 12.8 oz (68.856 kg)  07/21/13 151 lb (68.493 kg)  06/25/12 158 lb 3.2 oz (71.759 kg)     Past Medical History  Diagnosis Date  . Hypertension   . Hyperlipidemia   . Thyroid disease   . Hypothyroidism   . Chronic back pain   . PONV (postoperative nausea and vomiting)   . Chronic a-fib   . A-fib     Current Outpatient Prescriptions  Medication Sig Dispense Refill  . atorvastatin (LIPITOR) 10 MG tablet Take 10 mg by mouth daily.    Marland Kitchen. azithromycin (ZITHROMAX) 250 MG tablet Take 1 tablet (250 mg total) by mouth daily. Take first 2 tablets together, then 1 every day until finished. 6 tablet 0  . B Complex-C (B-COMPLEX WITH VITAMIN C) tablet Take 1 tablet by mouth daily.    Marland Kitchen. diltiazem (CARDIZEM CD) 300 MG 24 hr capsule Take 300 mg by mouth daily.    . furosemide (LASIX) 40 MG tablet Take 40 mg by mouth daily.     Marland Kitchen. labetalol (NORMODYNE) 200 MG tablet TAKE 1 TABLET BY MOUTH TWICE DAILY 60 tablet 6  . latanoprost (XALATAN) 0.005 % ophthalmic  solution Place 1 drop into both eyes at bedtime.  3  . levothyroxine (SYNTHROID, LEVOTHROID) 100 MCG tablet Take 100 mcg by mouth daily.    Bertram Gala. Polyethyl Glycol-Propyl Glycol (SYSTANE OP) Apply 2 drops to eye as directed.    . potassium chloride SA (K-DUR,KLOR-CON) 20 MEQ tablet Take 20 mEq by mouth daily.    . timolol (TIMOPTIC) 0.5 % ophthalmic solution Place 1 drop into both eyes every morning.   3  . warfarin (COUMADIN) 1 MG tablet Take 2-3 mg by mouth daily at 6 PM. Pt takes 2 mg on sun, tue, and 3 mg on mon, wed, thu, fri, sat     No current facility-administered medications for this visit.    Allergies:    Allergies  Allergen Reactions  . Penicillins Anaphylaxis and Swelling  . Sulfa Antibiotics Anaphylaxis and Swelling    Social History:  The patient  reports that she has never smoked. She has never used smokeless tobacco. She reports that she does not drink alcohol or use illicit drugs.   Family History:  The patient's family history includes Heart disease in her sister and sister.   ROS:  Please see the history of  present illness.  No nausea, vomiting.  No fevers, chills.  No focal weakness.  No dysuria. Leg weakness.     All other systems reviewed and negative.   PHYSICAL EXAM: VS:  BP 152/74 mmHg  Pulse 89  Ht 5\' 4"  (1.626 m)  Wt 151 lb 12.8 oz (68.856 kg)  BMI 26.04 kg/m2  SpO2 98% Well nourished, well developed, in no acute distress HEENT: normal Neck: no JVD, no carotid bruits Cardiac:  normal S1, S2; irreguarly irregularly Lungs:  clear to auscultation bilaterally, no wheezing, rhonchi or rales Abd: soft, nontender, no hepatomegaly Ext: tr ankle edema Skin: warm and dry Neuro:   no focal abnormalities noted  EKG: AFib, RVR, 108     ASSESSMENT AND PLAN:  Atrial fibrillation  Increase Cardizem CD Capsule Extended Release 24 Hour, 360 MG, 1 capsule, Orally, Once a day Notes: Suboptimal Rate control at times.  Will increase diltiazem.  Consider amiodarone if  she continues to have sx.  Can take additional Labetolol 100 mg as needed if palpitations occur.     2. Atherosclerosis of renal artery  Continue Atorvastatin Calcium Tablet, 10 MG, 1 tablet, Orally, Once a day Notes: Mild renal insufficiency in 2013. Stable. No indication for renal angiography.    3. Encounter for long-term (current) use of anticoagulants  Continue Warfarin Sodium Tablet, 1 MG, per pharmD, Orally, 2 mg qd except 1 mg M/F Notes: INR to be checked and coumadin dose to be adjusted, especially since antibiotic started.   4. HTN  Continue Labetalol HCl Tablet, 200 Milligram, 1 tablet, Orally, Twice a day Notes: Borderline Control today. WIl improve with increased cardizem.  Home readings high as well.    Follow Up as scheduled     Signed, Fredric MareJay S. Alayasia Breeding, MD, [facility] 02/23/2014 9:54 AM

## 2014-02-23 NOTE — Patient Instructions (Signed)
Your physician has recommended you make the following change in your medication:   START TAKING DILTIAZEM (CARDIZEM) 360 MG ONCE DAILY      Your physician recommends that you schedule a follow-up appointment in: AS ALREADY SCHEDULED IN April 2016.

## 2014-03-07 ENCOUNTER — Ambulatory Visit (INDEPENDENT_AMBULATORY_CARE_PROVIDER_SITE_OTHER): Payer: Medicare Other | Admitting: *Deleted

## 2014-03-07 DIAGNOSIS — Z5181 Encounter for therapeutic drug level monitoring: Secondary | ICD-10-CM

## 2014-03-07 DIAGNOSIS — I4891 Unspecified atrial fibrillation: Secondary | ICD-10-CM

## 2014-03-07 LAB — POCT INR: INR: 2.4

## 2014-04-06 ENCOUNTER — Ambulatory Visit (INDEPENDENT_AMBULATORY_CARE_PROVIDER_SITE_OTHER): Payer: Medicare Other | Admitting: *Deleted

## 2014-04-06 DIAGNOSIS — I4891 Unspecified atrial fibrillation: Secondary | ICD-10-CM

## 2014-04-06 DIAGNOSIS — Z5181 Encounter for therapeutic drug level monitoring: Secondary | ICD-10-CM

## 2014-04-06 LAB — POCT INR: INR: 2.4

## 2014-05-04 ENCOUNTER — Ambulatory Visit (INDEPENDENT_AMBULATORY_CARE_PROVIDER_SITE_OTHER): Payer: Medicare Other | Admitting: Surgery

## 2014-05-04 DIAGNOSIS — I4891 Unspecified atrial fibrillation: Secondary | ICD-10-CM

## 2014-05-04 DIAGNOSIS — Z5181 Encounter for therapeutic drug level monitoring: Secondary | ICD-10-CM

## 2014-05-04 LAB — POCT INR: INR: 2.4

## 2014-06-15 ENCOUNTER — Ambulatory Visit (INDEPENDENT_AMBULATORY_CARE_PROVIDER_SITE_OTHER): Payer: Medicare Other | Admitting: *Deleted

## 2014-06-15 DIAGNOSIS — I4891 Unspecified atrial fibrillation: Secondary | ICD-10-CM | POA: Diagnosis not present

## 2014-06-15 DIAGNOSIS — Z5181 Encounter for therapeutic drug level monitoring: Secondary | ICD-10-CM

## 2014-06-15 LAB — POCT INR: INR: 2.2

## 2014-06-24 ENCOUNTER — Other Ambulatory Visit: Payer: Self-pay | Admitting: *Deleted

## 2014-06-24 MED ORDER — WARFARIN SODIUM 1 MG PO TABS: 2.0000 mg | ORAL_TABLET | Freq: Every day | ORAL | Status: DC

## 2014-07-28 ENCOUNTER — Ambulatory Visit (INDEPENDENT_AMBULATORY_CARE_PROVIDER_SITE_OTHER): Payer: Medicare Other | Admitting: *Deleted

## 2014-07-28 DIAGNOSIS — I4891 Unspecified atrial fibrillation: Secondary | ICD-10-CM | POA: Diagnosis not present

## 2014-07-28 DIAGNOSIS — Z5181 Encounter for therapeutic drug level monitoring: Secondary | ICD-10-CM

## 2014-07-28 LAB — POCT INR: INR: 1.8

## 2014-08-08 ENCOUNTER — Ambulatory Visit (INDEPENDENT_AMBULATORY_CARE_PROVIDER_SITE_OTHER): Payer: Medicare Other | Admitting: Interventional Cardiology

## 2014-08-08 ENCOUNTER — Ambulatory Visit (INDEPENDENT_AMBULATORY_CARE_PROVIDER_SITE_OTHER): Payer: Medicare Other | Admitting: *Deleted

## 2014-08-08 ENCOUNTER — Encounter: Payer: Self-pay | Admitting: Interventional Cardiology

## 2014-08-08 VITALS — BP 160/70 | HR 63 | Ht 64.0 in | Wt 154.8 lb

## 2014-08-08 DIAGNOSIS — I10 Essential (primary) hypertension: Secondary | ICD-10-CM | POA: Diagnosis not present

## 2014-08-08 DIAGNOSIS — I701 Atherosclerosis of renal artery: Secondary | ICD-10-CM | POA: Insufficient documentation

## 2014-08-08 DIAGNOSIS — M79604 Pain in right leg: Secondary | ICD-10-CM

## 2014-08-08 DIAGNOSIS — I4891 Unspecified atrial fibrillation: Secondary | ICD-10-CM

## 2014-08-08 DIAGNOSIS — M79605 Pain in left leg: Secondary | ICD-10-CM

## 2014-08-08 DIAGNOSIS — Z5181 Encounter for therapeutic drug level monitoring: Secondary | ICD-10-CM

## 2014-08-08 LAB — POCT INR: INR: 3.3

## 2014-08-08 NOTE — Patient Instructions (Signed)
Medication Instructions: - none  Labwork: - none  Procedures/Testing: - none  Follow-Up: - Your physician wants you to follow-up in: 1 year with Dr. Varanasi. You will receive a reminder letter in the mail two months in advance. If you don't receive a letter, please call our office to schedule the follow-up appointment.  Any Additional Special Instructions Will Be Listed Below (If Applicable). - none  

## 2014-08-08 NOTE — Progress Notes (Signed)
Patient ID: Tonya Farley, female   DOB: 02/24/1920, 81 y.o.   MRN: [id number]     Cardiology Office Note   Date:  08/08/2014   ID:  Tonya Farley, DOB 01/23/1920, MRN [id number]  PCP:  Tonya Farley    No chief complaint on file. AFib, renal artery stenosis   Wt Readings from Last 3 Encounters:  08/08/14 154 lb 12.8 oz (70.217 kg)  02/23/14 151 lb 12.8 oz (68.856 kg)  07/21/13 151 lb (68.493 kg)       History of Present Illness: Tonya Farley is a 81 y.o. female  who has renal artery stenosis, HTN, AFib. Atrial Fibrillation F/U:  walks less - bilateral leg burning and weakness Denies : Chest pain.  Dizziness.  Leg edema.  Orthopnea. .  Shortness of breath.  Syncope.  Coumadin has been difficult to regulate.  No bleeding problems.   She reported her leg problems to her PMD but she states that no changes were made.  Feels burning and pain.  SHe has difficulty walking.  She still drives.    Past Medical History  Diagnosis Date  . Hypertension   . Hyperlipidemia   . Thyroid disease   . Hypothyroidism   . Chronic back pain   . PONV (postoperative nausea and vomiting)   . Chronic a-fib   . A-fib     Past Surgical History  Procedure Laterality Date  . Appendectomy    . Tonsillectomy    . Cataracts      bilateral  . Eye surgery      left eye "hole"     Current Outpatient Prescriptions  Medication Sig Dispense Refill  . alendronate (FOSAMAX) 70 MG tablet Take 70 mg by mouth once a week. Take with a full glass of water on an empty stomach.    Marland Kitchen. atorvastatin (LIPITOR) 10 MG tablet Take 10 mg by mouth daily.    . B Complex-C (B-COMPLEX WITH VITAMIN C) tablet Take 1 tablet by mouth daily.    . brimonidine (ALPHAGAN) 0.15 % ophthalmic solution Place 1 drop into both eyes 3 (three) times daily.    Marland Kitchen. diltiazem (CARDIZEM CD) 360 MG 24 hr capsule Take 1 capsule (360 mg total) by mouth daily. 90 capsule 6  . furosemide (LASIX) 40 MG tablet Take 40  mg by mouth daily.     Marland Kitchen. labetalol (NORMODYNE) 200 MG tablet TAKE 1 TABLET BY MOUTH TWICE DAILY 60 tablet 6  . latanoprost (XALATAN) 0.005 % ophthalmic solution Place 1 drop into both eyes at bedtime.  3  . levothyroxine (SYNTHROID, LEVOTHROID) 100 MCG tablet Take 100 mcg by mouth daily.    Bertram Gala. Polyethyl Glycol-Propyl Glycol (SYSTANE OP) Apply 2 drops to eye as directed.    . potassium chloride SA (K-DUR,KLOR-CON) 20 MEQ tablet Take 20 mEq by mouth daily.    . timolol (TIMOPTIC) 0.5 % ophthalmic solution Place 1 drop into both eyes every morning.   3  . warfarin (COUMADIN) 1 MG tablet Take 2-3 tablets (2-3 mg total) by mouth daily at 6 PM. Pt takes 2 mg on sun, tue, and 3 mg on mon, wed, thu, fri, sat 90 tablet 2   No current facility-administered medications for this visit.    Allergies:   Penicillins and Sulfa antibiotics    Social History:  The patient  reports that she has never smoked. She has never used smokeless tobacco. She reports that she does not drink alcohol or use illicit  drugs.   Family History:  The patient's *family history includes Cancer in her mother; Heart attack in her father; Heart disease in her sister and sister; Other in her father.    ROS:  Please see the history of present illness.   Otherwise, review of systems are positive for leg pain.   All other systems are reviewed and negative.    PHYSICAL EXAM: VS:  BP 160/70 mmHg  Pulse 63  Ht 5\' 4"  (1.626 m)  Wt 154 lb 12.8 oz (70.217 kg)  BMI 26.56 kg/m2 , BMI Body mass index is 26.56 kg/(m^2). GEN: Well nourished, well developed, in no acute distress HEENT: normal Neck: no JVD, carotid bruits, or masses Cardiac: irregularly irregular; no murmurs, rubs, or gallops, ; mild edema  Respiratory:  clear to auscultation bilaterally, normal work of breathing GI: soft, nontender, nondistended, + BS MS: no deformity or atrophy Skin: warm and dry, no rash Neuro:  Strength and sensation are intact Psych: euthymic mood,  full affect   EKG:   The ekg ordered today demonstrates AFib, rate controlled   Recent Labs: 02/20/2014: BUN 17; Creatinine 1.11*; Hemoglobin 11.7*; Platelets 258; Potassium 3.7; Sodium 143   Lipid Panel No results found for: CHOL, TRIG, HDL, CHOLHDL, VLDL, LDLCALC, LDLDIRECT   Other studies Reviewed: Additional studies/ records that were reviewed today with results demonstrating: Coumadin records showing 1.8, then 3.3.   ASSESSMENT AND PLAN:  Atrial fibrillation  Continue Cardizem CD Capsule Extended Release 24 Hour, 360 MG, 1 capsule, Orally, Once a day Notes: Adequate Rate control. Continue diltiazem. Consider amiodarone if she begins to have sx. Can take additional Labetolol 100 mg as needed if palpitations occur. She has not had to do this since the last visit. Watch for sx of bradycardia.   2. Atherosclerosis of renal artery  Continue Atorvastatin Calcium Tablet, 10 MG, 1 tablet, Orally, Once a day Notes: Mild renal insufficiency in 2013. Stable. No indication for renal angiography.    3. Encounter for long-term (current) use of anticoagulants  Continue Warfarin Sodium Tablet, 1 MG, per pharmD, Orally, 2 mg qd except 1 mg M/F Notes: INR to be checked and coumadin dose to be adjusted.  It has require a lot of changes.  Encouraged her to try to eat the same amount of greens daily.    4. HTN  Continue Labetalol HCl Tablet, 200 Milligram, 1 tablet, Orally, Twice a day Notes: High today. Improved at home with increased cardizem. Home readings are better.          5. Leg pain, bilateral: likley neuropathy based on burning sensation. Follow up with PMD. WOuld not plan vascular eval of legs.   Current medicines are reviewed at length with the patient today.  The patient concerns regarding her medicines were addressed.  The following changes have been made:  No change  Labs/ tests ordered today include: none No orders of the defined types were placed in this  encounter.    Recommend 150 minutes/week of aerobic exercise Low fat, low carb, high fiber diet recommended  Disposition:   FU in 1 year   Delorise JacksonSigned, VARANASI,JAYADEEP S., Farley  08/08/2014 9:49 AM    [facility] [address], [place], [place]  [address] Phone: [phone]; Fax: [phone]

## 2014-08-26 ENCOUNTER — Ambulatory Visit (INDEPENDENT_AMBULATORY_CARE_PROVIDER_SITE_OTHER): Payer: Medicare Other | Admitting: *Deleted

## 2014-08-26 DIAGNOSIS — Z5181 Encounter for therapeutic drug level monitoring: Secondary | ICD-10-CM | POA: Diagnosis not present

## 2014-08-26 DIAGNOSIS — I4891 Unspecified atrial fibrillation: Secondary | ICD-10-CM | POA: Diagnosis not present

## 2014-08-26 LAB — POCT INR: INR: 2.1

## 2014-09-16 ENCOUNTER — Other Ambulatory Visit: Payer: Self-pay

## 2014-09-16 ENCOUNTER — Ambulatory Visit (INDEPENDENT_AMBULATORY_CARE_PROVIDER_SITE_OTHER): Payer: Medicare Other | Admitting: *Deleted

## 2014-09-16 DIAGNOSIS — I4891 Unspecified atrial fibrillation: Secondary | ICD-10-CM | POA: Diagnosis not present

## 2014-09-16 DIAGNOSIS — Z5181 Encounter for therapeutic drug level monitoring: Secondary | ICD-10-CM

## 2014-09-16 LAB — POCT INR: INR: 1.6

## 2014-09-16 MED ORDER — LABETALOL HCL 200 MG PO TABS: 200.0000 mg | ORAL_TABLET | Freq: Two times a day (BID) | ORAL | Status: DC

## 2014-09-30 ENCOUNTER — Ambulatory Visit (INDEPENDENT_AMBULATORY_CARE_PROVIDER_SITE_OTHER): Payer: Medicare Other | Admitting: *Deleted

## 2014-09-30 DIAGNOSIS — I4891 Unspecified atrial fibrillation: Secondary | ICD-10-CM | POA: Diagnosis not present

## 2014-09-30 DIAGNOSIS — Z5181 Encounter for therapeutic drug level monitoring: Secondary | ICD-10-CM | POA: Diagnosis not present

## 2014-09-30 LAB — POCT INR: INR: 1.5

## 2014-10-10 ENCOUNTER — Ambulatory Visit (INDEPENDENT_AMBULATORY_CARE_PROVIDER_SITE_OTHER): Payer: Medicare Other | Admitting: *Deleted

## 2014-10-10 DIAGNOSIS — Z5181 Encounter for therapeutic drug level monitoring: Secondary | ICD-10-CM

## 2014-10-10 DIAGNOSIS — I4891 Unspecified atrial fibrillation: Secondary | ICD-10-CM

## 2014-10-10 LAB — POCT INR: INR: 1.9

## 2014-10-10 MED ORDER — WARFARIN SODIUM 1 MG PO TABS: 2.0000 mg | ORAL_TABLET | Freq: Every day | ORAL | Status: DC

## 2014-10-25 ENCOUNTER — Ambulatory Visit (INDEPENDENT_AMBULATORY_CARE_PROVIDER_SITE_OTHER): Payer: Medicare Other | Admitting: *Deleted

## 2014-10-25 DIAGNOSIS — Z5181 Encounter for therapeutic drug level monitoring: Secondary | ICD-10-CM

## 2014-10-25 DIAGNOSIS — I4891 Unspecified atrial fibrillation: Secondary | ICD-10-CM

## 2014-10-25 LAB — POCT INR: INR: 2

## 2014-11-15 ENCOUNTER — Ambulatory Visit (INDEPENDENT_AMBULATORY_CARE_PROVIDER_SITE_OTHER): Payer: Medicare Other | Admitting: *Deleted

## 2014-11-15 DIAGNOSIS — Z5181 Encounter for therapeutic drug level monitoring: Secondary | ICD-10-CM | POA: Diagnosis not present

## 2014-11-15 DIAGNOSIS — I4891 Unspecified atrial fibrillation: Secondary | ICD-10-CM | POA: Diagnosis not present

## 2014-11-15 LAB — POCT INR: INR: 1.9

## 2014-12-06 ENCOUNTER — Ambulatory Visit (INDEPENDENT_AMBULATORY_CARE_PROVIDER_SITE_OTHER): Payer: Medicare Other | Admitting: *Deleted

## 2014-12-06 DIAGNOSIS — Z5181 Encounter for therapeutic drug level monitoring: Secondary | ICD-10-CM

## 2014-12-06 DIAGNOSIS — I4891 Unspecified atrial fibrillation: Secondary | ICD-10-CM

## 2014-12-06 LAB — POCT INR: INR: 2.2

## 2014-12-15 ENCOUNTER — Other Ambulatory Visit: Payer: Self-pay | Admitting: Interventional Cardiology

## 2015-01-03 ENCOUNTER — Ambulatory Visit (INDEPENDENT_AMBULATORY_CARE_PROVIDER_SITE_OTHER): Payer: Medicare Other | Admitting: *Deleted

## 2015-01-03 DIAGNOSIS — I4891 Unspecified atrial fibrillation: Secondary | ICD-10-CM | POA: Diagnosis not present

## 2015-01-03 DIAGNOSIS — Z5181 Encounter for therapeutic drug level monitoring: Secondary | ICD-10-CM | POA: Diagnosis not present

## 2015-01-03 LAB — POCT INR: INR: 2.6

## 2015-01-10 ENCOUNTER — Emergency Department (HOSPITAL_COMMUNITY)
Admission: EM | Admit: 2015-01-10 | Discharge: 2015-01-11 | Disposition: A | Discharge status: Home / Self Care | Payer: Medicare Other | Attending: Emergency Medicine | Admitting: Emergency Medicine

## 2015-01-10 ENCOUNTER — Encounter (HOSPITAL_COMMUNITY): Payer: Self-pay | Admitting: Emergency Medicine

## 2015-01-10 ENCOUNTER — Emergency Department (HOSPITAL_COMMUNITY): Payer: Medicare Other

## 2015-01-10 DIAGNOSIS — I10 Essential (primary) hypertension: Secondary | ICD-10-CM | POA: Diagnosis not present

## 2015-01-10 DIAGNOSIS — E785 Hyperlipidemia, unspecified: Secondary | ICD-10-CM | POA: Insufficient documentation

## 2015-01-10 DIAGNOSIS — Z7901 Long term (current) use of anticoagulants: Secondary | ICD-10-CM | POA: Diagnosis not present

## 2015-01-10 DIAGNOSIS — R0600 Dyspnea, unspecified: Secondary | ICD-10-CM | POA: Diagnosis not present

## 2015-01-10 DIAGNOSIS — E039 Hypothyroidism, unspecified: Secondary | ICD-10-CM | POA: Diagnosis not present

## 2015-01-10 DIAGNOSIS — G8929 Other chronic pain: Secondary | ICD-10-CM | POA: Diagnosis not present

## 2015-01-10 DIAGNOSIS — N289 Disorder of kidney and ureter, unspecified: Secondary | ICD-10-CM | POA: Diagnosis not present

## 2015-01-10 DIAGNOSIS — D649 Anemia, unspecified: Secondary | ICD-10-CM | POA: Diagnosis not present

## 2015-01-10 DIAGNOSIS — R531 Weakness: Secondary | ICD-10-CM | POA: Diagnosis present

## 2015-01-10 DIAGNOSIS — Z79899 Other long term (current) drug therapy: Secondary | ICD-10-CM | POA: Insufficient documentation

## 2015-01-10 DIAGNOSIS — I482 Chronic atrial fibrillation, unspecified: Secondary | ICD-10-CM

## 2015-01-10 LAB — DIFFERENTIAL
BASOS ABS: 0 10*3/uL (ref 0.0–0.1)
BASOS PCT: 0 %
Eosinophils Absolute: 0.2 10*3/uL (ref 0.0–0.7)
Eosinophils Relative: 2 %
LYMPHS ABS: 1.1 10*3/uL (ref 0.7–4.0)
LYMPHS PCT: 10 %
MONO ABS: 1 10*3/uL (ref 0.1–1.0)
MONOS PCT: 9 %
NEUTROS ABS: 8.4 10*3/uL — AB (ref 1.7–7.7)
Neutrophils Relative %: 79 %

## 2015-01-10 LAB — CBC
HCT: 34.9 % — ABNORMAL LOW (ref 36.0–46.0)
Hemoglobin: 11.3 g/dL — ABNORMAL LOW (ref 12.0–15.0)
MCH: 27.8 pg (ref 26.0–34.0)
MCHC: 32.4 g/dL (ref 30.0–36.0)
MCV: 86 fL (ref 78.0–100.0)
Platelets: 194 10*3/uL (ref 150–400)
RBC: 4.06 MIL/uL (ref 3.87–5.11)
RDW: 16.2 % — AB (ref 11.5–15.5)
WBC: 10.7 10*3/uL — ABNORMAL HIGH (ref 4.0–10.5)

## 2015-01-10 LAB — URINALYSIS, ROUTINE W REFLEX MICROSCOPIC
Bilirubin Urine: NEGATIVE
GLUCOSE, UA: NEGATIVE mg/dL
KETONES UR: NEGATIVE mg/dL
LEUKOCYTES UA: NEGATIVE
NITRITE: NEGATIVE
PH: 6 (ref 5.0–8.0)
PROTEIN: NEGATIVE mg/dL
Specific Gravity, Urine: 1.014 (ref 1.005–1.030)
Urobilinogen, UA: 1 mg/dL (ref 0.0–1.0)

## 2015-01-10 LAB — HEPATIC FUNCTION PANEL
ALBUMIN: 3.7 g/dL (ref 3.5–5.0)
ALT: 20 U/L (ref 14–54)
AST: 19 U/L (ref 15–41)
Alkaline Phosphatase: 65 U/L (ref 38–126)
BILIRUBIN DIRECT: 0.2 mg/dL (ref 0.1–0.5)
Indirect Bilirubin: 0.5 mg/dL (ref 0.3–0.9)
Total Bilirubin: 0.7 mg/dL (ref 0.3–1.2)
Total Protein: 6.6 g/dL (ref 6.5–8.1)

## 2015-01-10 LAB — URINE MICROSCOPIC-ADD ON

## 2015-01-10 LAB — BASIC METABOLIC PANEL
Anion gap: 8 (ref 5–15)
BUN: 20 mg/dL (ref 6–20)
CHLORIDE: 106 mmol/L (ref 101–111)
CO2: 27 mmol/L (ref 22–32)
CREATININE: 1.3 mg/dL — AB (ref 0.44–1.00)
Calcium: 9.6 mg/dL (ref 8.9–10.3)
GFR calc non Af Amer: 34 mL/min — ABNORMAL LOW (ref >60)
GFR, EST AFRICAN AMERICAN: 40 mL/min — AB (ref >60)
GLUCOSE: 123 mg/dL — AB (ref 65–99)
Potassium: 3.5 mmol/L (ref 3.5–5.1)
Sodium: 141 mmol/L (ref 135–145)

## 2015-01-10 LAB — TROPONIN I: Troponin I: <0.03 ng/mL (ref <0.031)

## 2015-01-10 LAB — BRAIN NATRIURETIC PEPTIDE: B Natriuretic Peptide: 382.1 pg/mL — ABNORMAL HIGH (ref 0.0–100.0)

## 2015-01-10 LAB — PROTIME-INR
INR: 2.44 — ABNORMAL HIGH (ref 0.00–1.49)
PROTHROMBIN TIME: 26.2 s — AB (ref 11.6–15.2)

## 2015-01-10 NOTE — ED Notes (Signed)
Pt in EMS from home C/O weakness for past several days. At 5pm started having SOB, palpitations, HTN. EMS arrived and noted pt to be in a-fib with RVR. HR 80's-120's. En route SOB and palpitations resolved. Hx a-fib. Denies CP, N/V, dizziness, syncope.

## 2015-01-10 NOTE — ED Provider Notes (Signed)
CSN: [id number]     Arrival date & time 01/10/15  1953 History   First MD Initiated Contact with Patient 01/10/15 2026     Chief Complaint  Patient presents with  . Atrial Fibrillation  . Weakness     (Consider location/radiation/quality/duration/timing/severity/associated sxs/prior Treatment) Patient is a 81 y.o. female presenting with atrial fibrillation and weakness. The history is provided by the patient.  Atrial Fibrillation  Weakness  She states that she has felt funny for the last 3 days. She describes as just feeling generally weak. She denies fever, chills, sweats. She denies a cough. She denies chest pain. There was some mild dyspnea at home but she is not feeling dyspneic anymore. She denies nausea, vomiting, diarrhea. She denies arthralgias or myalgias. EMS initially noted atrial fibrillation now with heart rate up to 120. Patient does have known history of atrial fibrillation. Since arrival in the ED, heart rate has been in the 80s to low 90s.  Past Medical History  Diagnosis Date  . Hypertension   . Hyperlipidemia   . Thyroid disease   . Hypothyroidism   . Chronic back pain   . PONV (postoperative nausea and vomiting)   . Chronic a-fib (HCC)   . A-fib [facility](HCC)    Past Surgical History  Procedure Laterality Date  . Appendectomy    . Tonsillectomy    . Cataracts      bilateral  . Eye surgery      left eye "hole"   Family History  Problem Relation Age of Onset  . Heart disease Sister   . Heart disease Sister   . Cancer Mother   . Other Father   . Heart attack Father    Social History  Substance Use Topics  . Smoking status: Never Smoker   . Smokeless tobacco: Never Used  . Alcohol Use: No   OB History    No data available     Review of Systems  Neurological: Positive for weakness.  All other systems reviewed and are negative.     Allergies  Penicillins and Sulfa antibiotics  Home Medications   Prior to Admission medications   Medication  Sig Start Date End Date Taking? Authorizing Provider  alendronate (FOSAMAX) 70 MG tablet Take 70 mg by mouth once a week. Take with a full glass of water on an empty stomach.    Historical Provider, MD  atorvastatin (LIPITOR) 10 MG tablet Take 10 mg by mouth daily.    Historical Provider, MD  B Complex-C (B-COMPLEX WITH VITAMIN C) tablet Take 1 tablet by mouth daily.    Historical Provider, MD  brimonidine (ALPHAGAN) 0.15 % ophthalmic solution Place 1 drop into both eyes 3 (three) times daily.    Historical Provider, MD  diltiazem (CARDIZEM CD) 360 MG 24 hr capsule Take 1 capsule (360 mg total) by mouth daily. 02/23/14   Corky CraftsJayadeep S Varanasi, MD  furosemide (LASIX) 40 MG tablet Take 40 mg by mouth daily.     Historical Provider, MD  labetalol (NORMODYNE) 200 MG tablet Take 1 tablet (200 mg total) by mouth 2 (two) times daily. 09/16/14   Corky CraftsJayadeep S Varanasi, MD  latanoprost (XALATAN) 0.005 % ophthalmic solution Place 1 drop into both eyes at bedtime. 01/10/14   Historical Provider, MD  levothyroxine (SYNTHROID, LEVOTHROID) 100 MCG tablet Take 100 mcg by mouth daily.    Historical Provider, MD  Polyethyl Glycol-Propyl Glycol (SYSTANE OP) Apply 2 drops to eye as directed.    Historical Provider, MD  potassium chloride SA (K-DUR,KLOR-CON) 20 MEQ tablet Take 20 mEq by mouth daily.    Historical Provider, MD  timolol (TIMOPTIC) 0.5 % ophthalmic solution Place 1 drop into both eyes every morning.  12/28/13   Historical Provider, MD  warfarin (COUMADIN) 1 MG tablet Take as directed by coumadin clinic 12/15/14   Corky Crafts, MD   BP 163/60 mmHg  Pulse 98  Temp(Src) 98.1 F (36.7 C) (Oral)  Resp 21  Ht  (1.626 m)  Wt 150 lb (68.04 kg)  BMI 25.73 kg/m2  SpO2 97% Physical Exam  Nursing note and vitals reviewed.  81 year old female, resting comfortably and in no acute distress. Vital signs are significant for hypertension and borderline tachypnea. Oxygen saturation is 97%, which is  normal. Head is normocephalic and atraumatic. PERRLA, EOMI. Oropharynx is clear. Neck is nontender and supple without adenopathy or JVD. Back is nontender and there is no CVA tenderness. Lungs have bibasilar rales without wheezes or rhonchi. Chest is nontender. Heart has regular rate and rhythm without murmur. Abdomen is soft, flat, nontender without masses or hepatosplenomegaly and peristalsis is normoactive. Extremities have no cyanosis or edema, full range of motion is present. Skin is warm and dry without rash. Neurologic: Mental status is normal, cranial nerves are intact, there are no motor or sensory deficits.  ED Course  Procedures (including critical care time) Labs Review Results for orders placed or performed during the hospital encounter of 01/10/15  Basic metabolic panel  Result Value Ref Range   Sodium 141 135 - 145 mmol/L   Potassium 3.5 3.5 - 5.1 mmol/L   Chloride 106 101 - 111 mmol/L   CO2 27 22 - 32 mmol/L   Glucose, Bld 123 (H) 65 - 99 mg/dL   BUN 20 6 - 20 mg/dL   Creatinine, Ser 1.61 (H) 0.44 - 1.00 mg/dL   Calcium 9.6 8.9 - 09.6 mg/dL   GFR calc non Af Amer 34 (L) >60 mL/min   GFR calc Af Amer 40 (L) >60 mL/min   Anion gap 8 5 - 15  CBC  Result Value Ref Range   WBC 10.7 (H) 4.0 - 10.5 K/uL   RBC 4.06 3.87 - 5.11 MIL/uL   Hemoglobin 11.3 (L) 12.0 - 15.0 g/dL   HCT 04.5 (L) 40.9 - 81.1 %   MCV 86.0 78.0 - 100.0 fL   MCH 27.8 26.0 - 34.0 pg   MCHC 32.4 30.0 - 36.0 g/dL   RDW 91.4 (H) 78.2 - 95.6 %   Platelets 194 150 - 400 K/uL  Protime-INR - (order if Patient is taking Coumadin / Warfarin)  Result Value Ref Range   Prothrombin Time 26.2 (H) 11.6 - 15.2 seconds   INR 2.44 (H) 0.00 - 1.49  Troponin I  Result Value Ref Range   Troponin I <0.03 <0.031 ng/mL  Brain natriuretic peptide  Result Value Ref Range   B Natriuretic Peptide 382.1 (H) 0.0 - 100.0 pg/mL  Urinalysis, Routine w reflex microscopic  Result Value Ref Range   Color, Urine YELLOW  YELLOW   APPearance CLEAR CLEAR   Specific Gravity, Urine 1.014 1.005 - 1.030   pH 6.0 5.0 - 8.0   Glucose, UA NEGATIVE NEGATIVE mg/dL   Hgb urine dipstick MODERATE (A) NEGATIVE   Bilirubin Urine NEGATIVE NEGATIVE   Ketones, ur NEGATIVE NEGATIVE mg/dL   Protein, ur NEGATIVE NEGATIVE mg/dL   Urobilinogen, UA 1.0 0.0 - 1.0 mg/dL   Nitrite NEGATIVE NEGATIVE  Leukocytes, UA NEGATIVE NEGATIVE  Hepatic function panel  Result Value Ref Range   Total Protein 6.6 6.5 - 8.1 g/dL   Albumin 3.7 3.5 - 5.0 g/dL   AST 19 15 - 41 U/L   ALT 20 14 - 54 U/L   Alkaline Phosphatase 65 38 - 126 U/L   Total Bilirubin 0.7 0.3 - 1.2 mg/dL   Bilirubin, Direct 0.2 0.1 - 0.5 mg/dL   Indirect Bilirubin 0.5 0.3 - 0.9 mg/dL  Differential  Result Value Ref Range   Neutrophils Relative % 79 %   Neutro Abs 8.4 (H) 1.7 - 7.7 K/uL   Lymphocytes Relative 10 %   Lymphs Abs 1.1 0.7 - 4.0 K/uL   Monocytes Relative 9 %   Monocytes Absolute 1.0 0.1 - 1.0 K/uL   Eosinophils Relative 2 %   Eosinophils Absolute 0.2 0.0 - 0.7 K/uL   Basophils Relative 0 %   Basophils Absolute 0.0 0.0 - 0.1 K/uL  Urine microscopic-add on  Result Value Ref Range   Squamous Epithelial / LPF RARE RARE   RBC / HPF 7-10 <3 RBC/hpf   Bacteria, UA RARE RARE    Imaging Review Dg Chest 2 View  01/10/2015  CLINICAL DATA:  Atrial fibrillation. Weakness. Shortness of breath. EXAM: CHEST  2 VIEW COMPARISON:  02/20/2014 chest radiograph FINDINGS: Stable cardiomediastinal silhouette with mild cardiomegaly. No pneumothorax. Trace bilateral pleural effusions. Mild pulmonary edema. Mild bilateral lower lobe scarring versus atelectasis. IMPRESSION: 1. Mild congestive heart failure. 2. Trace bilateral pleural effusions. 3. Mild scarring versus atelectasis in the bilateral lower lobes. Electronically Signed   By: Delbert Phenix M.D.   On: 01/10/2015 21:10   I have personally reviewed and evaluated these images and lab results as part of my medical  decision-making.   EKG Interpretation   Date/Time:  Tuesday January 10 2015 19:58:01 EDT Ventricular Rate:  93 PR Interval:    QRS Duration: 116 QT Interval:  369 QTC Calculation: 459 R Axis:   -67 Text Interpretation:  Atrial fibrillation Left anterior fascicular block  LVH with secondary repolarization abnormality Anterior Q waves, possibly  due to LVH When compared with ECG of 02/20/2014, No significant change was  found Confirmed by [facility]  MD, Kylee Umana ([id number]) on 01/10/2015 8:04:22 PM      MDM   Final diagnoses:  Weakness  Atrial fibrillation, chronic (HCC)  Renal insufficiency  Normochromic normocytic anemia    Malaise of uncertain cause. Old records are reviewed and she does have long-standing history of atrial fibrillation. Screening labs are obtained.  Laboratory workup is unremarkable. Creatinine is slightly higher than most recent value but within a range that patient has been in previously. Electrolytes are unremarkable. There is a very minor anemia which is stable. Urinalysis shows no evidence of infection. Patient felt better after being in the ED bed she was ambulated and did very well and states that she was feeling better and felt comfortable going home. She is discharged without a clear cause for her weakness. She is referred back to her PCP for follow-up.  Dione Booze, MD 01/11/15 [phone]

## 2015-01-11 NOTE — Discharge Instructions (Signed)
He had evaluation here did not show a reason for why he felt so weak. Please follow-up with your PCP in 2 days for recheck. Return to the ED if symptoms seem to be getting worse.  Weakness Weakness is a lack of strength. It may be felt all over the body (generalized) or in one specific part of the body (focal). Some causes of weakness can be serious. You may need further medical evaluation, especially if you are elderly or you have a history of immunosuppression (such as chemotherapy or HIV), kidney disease, heart disease, or diabetes. CAUSES  Weakness can be caused by many different things, including:  Infection.  Physical exhaustion.  Internal bleeding or other blood loss that results in a lack of red blood cells (anemia).  Dehydration. This cause is more common in elderly people.  Side effects or electrolyte abnormalities from medicines, such as pain medicines or sedatives.  Emotional distress, anxiety, or depression.  Circulation problems, especially severe peripheral arterial disease.  Heart disease, such as rapid atrial fibrillation, bradycardia, or heart failure.  Nervous system disorders, such as Guillain-Barr syndrome, multiple sclerosis, or stroke. DIAGNOSIS  To find the cause of your weakness, your caregiver will take your history and perform a physical exam. Lab tests or X-rays may also be ordered, if needed. TREATMENT  Treatment of weakness depends on the cause of your symptoms and can vary greatly. HOME CARE INSTRUCTIONS   Rest as needed.  Eat a well-balanced diet.  Try to get some exercise every day.  Only take over-the-counter or prescription medicines as directed by your caregiver. SEEK MEDICAL CARE IF:   Your weakness seems to be getting worse or spreads to other parts of your body.  You develop new aches or pains. SEEK IMMEDIATE MEDICAL CARE IF:   You cannot perform your normal daily activities, such as getting dressed and feeding yourself.  You  cannot walk up and down stairs, or you feel exhausted when you do so.  You have shortness of breath or chest pain.  You have difficulty moving parts of your body.  You have weakness in only one area of the body or on only one side of the body.  You have a fever.  You have trouble speaking or swallowing.  You cannot control your bladder or bowel movements.  You have black or bloody vomit or stools. MAKE SURE YOU:  Understand these instructions.  Will watch your condition.  Will get help right away if you are not doing well or get worse.   This information is not intended to replace advice given to you by your health care provider. Make sure you discuss any questions you have with your health care provider.   Document Released: 03/11/2005 Document Revised: 09/10/2011 Document Reviewed: 05/10/2011 Elsevier Interactive Patient Education [employer]2016 [employer]

## 2015-01-11 NOTE — ED Notes (Signed)
Pt ambulated with no problem. No dizziness or weakness occurred. EDP notified

## 2015-01-31 ENCOUNTER — Ambulatory Visit (INDEPENDENT_AMBULATORY_CARE_PROVIDER_SITE_OTHER): Payer: Medicare Other | Admitting: *Deleted

## 2015-01-31 DIAGNOSIS — I4891 Unspecified atrial fibrillation: Secondary | ICD-10-CM

## 2015-01-31 DIAGNOSIS — Z5181 Encounter for therapeutic drug level monitoring: Secondary | ICD-10-CM

## 2015-01-31 LAB — POCT INR: INR: 2.5

## 2015-03-14 ENCOUNTER — Ambulatory Visit (INDEPENDENT_AMBULATORY_CARE_PROVIDER_SITE_OTHER): Payer: Medicare Other | Admitting: *Deleted

## 2015-03-14 DIAGNOSIS — I4891 Unspecified atrial fibrillation: Secondary | ICD-10-CM

## 2015-03-14 DIAGNOSIS — Z5181 Encounter for therapeutic drug level monitoring: Secondary | ICD-10-CM | POA: Diagnosis not present

## 2015-03-14 LAB — POCT INR: INR: 4.8

## 2015-03-28 ENCOUNTER — Ambulatory Visit (INDEPENDENT_AMBULATORY_CARE_PROVIDER_SITE_OTHER): Payer: Medicare Other | Admitting: *Deleted

## 2015-03-28 DIAGNOSIS — Z5181 Encounter for therapeutic drug level monitoring: Secondary | ICD-10-CM | POA: Diagnosis not present

## 2015-03-28 DIAGNOSIS — I4891 Unspecified atrial fibrillation: Secondary | ICD-10-CM

## 2015-03-28 LAB — POCT INR: INR: 4.4

## 2015-04-11 ENCOUNTER — Other Ambulatory Visit: Payer: Self-pay | Admitting: *Deleted

## 2015-04-11 ENCOUNTER — Ambulatory Visit (INDEPENDENT_AMBULATORY_CARE_PROVIDER_SITE_OTHER): Payer: Medicare Other | Admitting: *Deleted

## 2015-04-11 DIAGNOSIS — I4891 Unspecified atrial fibrillation: Secondary | ICD-10-CM | POA: Diagnosis not present

## 2015-04-11 DIAGNOSIS — Z5181 Encounter for therapeutic drug level monitoring: Secondary | ICD-10-CM | POA: Diagnosis not present

## 2015-04-11 LAB — POCT INR: INR: 2.4

## 2015-04-11 NOTE — Telephone Encounter (Signed)
Pt seen in the CVRR clinic requesting a refill on her labetalol & sent to [facility] at [facility]

## 2015-04-12 MED ORDER — LABETALOL HCL 200 MG PO TABS: 200.0000 mg | ORAL_TABLET | Freq: Two times a day (BID) | ORAL | Status: DC

## 2015-05-02 ENCOUNTER — Ambulatory Visit (INDEPENDENT_AMBULATORY_CARE_PROVIDER_SITE_OTHER): Payer: Medicare Other | Admitting: *Deleted

## 2015-05-02 DIAGNOSIS — Z5181 Encounter for therapeutic drug level monitoring: Secondary | ICD-10-CM | POA: Diagnosis not present

## 2015-05-02 DIAGNOSIS — I4891 Unspecified atrial fibrillation: Secondary | ICD-10-CM | POA: Diagnosis not present

## 2015-05-02 LAB — POCT INR: INR: 2.2

## 2015-05-18 ENCOUNTER — Other Ambulatory Visit: Payer: Self-pay | Admitting: *Deleted

## 2015-05-18 MED ORDER — DILTIAZEM HCL ER COATED BEADS 360 MG PO CP24: 360.0000 mg | ORAL_CAPSULE | Freq: Every day | ORAL | Status: DC

## 2015-05-29 ENCOUNTER — Other Ambulatory Visit: Payer: Self-pay | Admitting: Internal Medicine

## 2015-05-29 ENCOUNTER — Ambulatory Visit
Admission: RE | Admit: 2015-05-29 | Discharge: 2015-05-29 | Disposition: A | Discharge status: Home / Self Care | Payer: Medicare Other | Source: Ambulatory Visit | Attending: Internal Medicine | Admitting: Internal Medicine

## 2015-05-29 DIAGNOSIS — R059 Cough, unspecified: Secondary | ICD-10-CM

## 2015-05-29 DIAGNOSIS — R05 Cough: Secondary | ICD-10-CM

## 2015-05-30 ENCOUNTER — Emergency Department (HOSPITAL_COMMUNITY): Payer: Medicare Other

## 2015-05-30 ENCOUNTER — Encounter (HOSPITAL_COMMUNITY): Payer: Self-pay | Admitting: *Deleted

## 2015-05-30 ENCOUNTER — Inpatient Hospital Stay (HOSPITAL_COMMUNITY)
Admission: EM | Admit: 2015-05-30 | Discharge: 2015-06-19 | DRG: 193 | Disposition: A | Discharge status: Skilled Nursing Facility | Payer: Medicare Other | Attending: Internal Medicine | Admitting: Internal Medicine

## 2015-05-30 DIAGNOSIS — E876 Hypokalemia: Secondary | ICD-10-CM | POA: Diagnosis present

## 2015-05-30 DIAGNOSIS — J1008 Influenza due to other identified influenza virus with other specified pneumonia: Principal | ICD-10-CM | POA: Diagnosis present

## 2015-05-30 DIAGNOSIS — I481 Persistent atrial fibrillation: Secondary | ICD-10-CM

## 2015-05-30 DIAGNOSIS — E860 Dehydration: Secondary | ICD-10-CM | POA: Diagnosis present

## 2015-05-30 DIAGNOSIS — I350 Nonrheumatic aortic (valve) stenosis: Secondary | ICD-10-CM | POA: Diagnosis present

## 2015-05-30 DIAGNOSIS — J09X2 Influenza due to identified novel influenza A virus with other respiratory manifestations: Secondary | ICD-10-CM | POA: Diagnosis not present

## 2015-05-30 DIAGNOSIS — J209 Acute bronchitis, unspecified: Secondary | ICD-10-CM | POA: Diagnosis present

## 2015-05-30 DIAGNOSIS — R059 Cough, unspecified: Secondary | ICD-10-CM | POA: Insufficient documentation

## 2015-05-30 DIAGNOSIS — I482 Chronic atrial fibrillation: Secondary | ICD-10-CM | POA: Diagnosis present

## 2015-05-30 DIAGNOSIS — R0989 Other specified symptoms and signs involving the circulatory and respiratory systems: Secondary | ICD-10-CM | POA: Diagnosis not present

## 2015-05-30 DIAGNOSIS — N183 Chronic kidney disease, stage 3 (moderate): Secondary | ICD-10-CM | POA: Diagnosis present

## 2015-05-30 DIAGNOSIS — J1100 Influenza due to unidentified influenza virus with unspecified type of pneumonia: Secondary | ICD-10-CM | POA: Diagnosis not present

## 2015-05-30 DIAGNOSIS — J90 Pleural effusion, not elsewhere classified: Secondary | ICD-10-CM | POA: Diagnosis present

## 2015-05-30 DIAGNOSIS — I4891 Unspecified atrial fibrillation: Secondary | ICD-10-CM | POA: Diagnosis present

## 2015-05-30 DIAGNOSIS — J690 Pneumonitis due to inhalation of food and vomit: Secondary | ICD-10-CM | POA: Diagnosis present

## 2015-05-30 DIAGNOSIS — E785 Hyperlipidemia, unspecified: Secondary | ICD-10-CM | POA: Insufficient documentation

## 2015-05-30 DIAGNOSIS — R05 Cough: Secondary | ICD-10-CM

## 2015-05-30 DIAGNOSIS — I129 Hypertensive chronic kidney disease with stage 1 through stage 4 chronic kidney disease, or unspecified chronic kidney disease: Secondary | ICD-10-CM | POA: Diagnosis present

## 2015-05-30 DIAGNOSIS — I5022 Chronic systolic (congestive) heart failure: Secondary | ICD-10-CM | POA: Insufficient documentation

## 2015-05-30 DIAGNOSIS — T380X5A Adverse effect of glucocorticoids and synthetic analogues, initial encounter: Secondary | ICD-10-CM | POA: Diagnosis present

## 2015-05-30 DIAGNOSIS — J101 Influenza due to other identified influenza virus with other respiratory manifestations: Secondary | ICD-10-CM | POA: Diagnosis not present

## 2015-05-30 DIAGNOSIS — E46 Unspecified protein-calorie malnutrition: Secondary | ICD-10-CM | POA: Diagnosis present

## 2015-05-30 DIAGNOSIS — Z7901 Long term (current) use of anticoagulants: Secondary | ICD-10-CM | POA: Diagnosis not present

## 2015-05-30 DIAGNOSIS — Z66 Do not resuscitate: Secondary | ICD-10-CM | POA: Diagnosis present

## 2015-05-30 DIAGNOSIS — J189 Pneumonia, unspecified organism: Secondary | ICD-10-CM | POA: Diagnosis present

## 2015-05-30 DIAGNOSIS — E8770 Fluid overload, unspecified: Secondary | ICD-10-CM | POA: Diagnosis not present

## 2015-05-30 DIAGNOSIS — Z882 Allergy status to sulfonamides status: Secondary | ICD-10-CM | POA: Diagnosis not present

## 2015-05-30 DIAGNOSIS — R0600 Dyspnea, unspecified: Secondary | ICD-10-CM

## 2015-05-30 DIAGNOSIS — E039 Hypothyroidism, unspecified: Secondary | ICD-10-CM | POA: Diagnosis present

## 2015-05-30 DIAGNOSIS — R609 Edema, unspecified: Secondary | ICD-10-CM | POA: Insufficient documentation

## 2015-05-30 DIAGNOSIS — R5381 Other malaise: Secondary | ICD-10-CM | POA: Insufficient documentation

## 2015-05-30 DIAGNOSIS — R0602 Shortness of breath: Secondary | ICD-10-CM

## 2015-05-30 DIAGNOSIS — I480 Paroxysmal atrial fibrillation: Secondary | ICD-10-CM | POA: Diagnosis present

## 2015-05-30 DIAGNOSIS — Z8249 Family history of ischemic heart disease and other diseases of the circulatory system: Secondary | ICD-10-CM

## 2015-05-30 DIAGNOSIS — Y95 Nosocomial condition: Secondary | ICD-10-CM | POA: Diagnosis present

## 2015-05-30 DIAGNOSIS — I272 Other secondary pulmonary hypertension: Secondary | ICD-10-CM | POA: Diagnosis present

## 2015-05-30 DIAGNOSIS — D72828 Other elevated white blood cell count: Secondary | ICD-10-CM | POA: Diagnosis not present

## 2015-05-30 DIAGNOSIS — Z9889 Other specified postprocedural states: Secondary | ICD-10-CM

## 2015-05-30 DIAGNOSIS — M7989 Other specified soft tissue disorders: Secondary | ICD-10-CM | POA: Diagnosis present

## 2015-05-30 DIAGNOSIS — J9621 Acute and chronic respiratory failure with hypoxia: Secondary | ICD-10-CM | POA: Insufficient documentation

## 2015-05-30 DIAGNOSIS — N179 Acute kidney failure, unspecified: Secondary | ICD-10-CM | POA: Diagnosis present

## 2015-05-30 DIAGNOSIS — G8929 Other chronic pain: Secondary | ICD-10-CM | POA: Diagnosis present

## 2015-05-30 DIAGNOSIS — I5033 Acute on chronic diastolic (congestive) heart failure: Secondary | ICD-10-CM | POA: Diagnosis present

## 2015-05-30 LAB — COMPREHENSIVE METABOLIC PANEL
ALT: 33 U/L (ref 14–54)
AST: 53 U/L — ABNORMAL HIGH (ref 15–41)
Albumin: 3.5 g/dL (ref 3.5–5.0)
Alkaline Phosphatase: 54 U/L (ref 38–126)
Anion gap: 11 (ref 5–15)
BILIRUBIN TOTAL: 0.8 mg/dL (ref 0.3–1.2)
BUN: 38 mg/dL — ABNORMAL HIGH (ref 6–20)
CHLORIDE: 98 mmol/L — AB (ref 101–111)
CO2: 26 mmol/L (ref 22–32)
CREATININE: 1.82 mg/dL — AB (ref 0.44–1.00)
Calcium: 8.3 mg/dL — ABNORMAL LOW (ref 8.9–10.3)
GFR, EST AFRICAN AMERICAN: 26 mL/min — AB (ref >60)
GFR, EST NON AFRICAN AMERICAN: 23 mL/min — AB (ref >60)
Glucose, Bld: 123 mg/dL — ABNORMAL HIGH (ref 65–99)
Potassium: 3.3 mmol/L — ABNORMAL LOW (ref 3.5–5.1)
Sodium: 135 mmol/L (ref 135–145)
TOTAL PROTEIN: 6 g/dL — AB (ref 6.5–8.1)

## 2015-05-30 LAB — CBC WITH DIFFERENTIAL/PLATELET
Basophils Absolute: 0 10*3/uL (ref 0.0–0.1)
Basophils Relative: 0 %
EOS PCT: 0 %
Eosinophils Absolute: 0 10*3/uL (ref 0.0–0.7)
HCT: 37 % (ref 36.0–46.0)
Hemoglobin: 11.9 g/dL — ABNORMAL LOW (ref 12.0–15.0)
LYMPHS ABS: 0.8 10*3/uL (ref 0.7–4.0)
LYMPHS PCT: 17 %
MCH: 27.4 pg (ref 26.0–34.0)
MCHC: 32.2 g/dL (ref 30.0–36.0)
MCV: 85.1 fL (ref 78.0–100.0)
MONO ABS: 0.7 10*3/uL (ref 0.1–1.0)
Monocytes Relative: 16 %
Neutro Abs: 3.2 10*3/uL (ref 1.7–7.7)
Neutrophils Relative %: 67 %
PLATELETS: 120 10*3/uL — AB (ref 150–400)
RBC: 4.35 MIL/uL (ref 3.87–5.11)
RDW: 16.6 % — AB (ref 11.5–15.5)
WBC: 4.7 10*3/uL (ref 4.0–10.5)

## 2015-05-30 LAB — I-STAT TROPONIN, ED: Troponin i, poc: 0.11 ng/mL (ref 0.00–0.08)

## 2015-05-30 LAB — URINE MICROSCOPIC-ADD ON

## 2015-05-30 LAB — URINALYSIS, ROUTINE W REFLEX MICROSCOPIC
BILIRUBIN URINE: NEGATIVE
Glucose, UA: NEGATIVE mg/dL
KETONES UR: NEGATIVE mg/dL
Leukocytes, UA: NEGATIVE
NITRITE: NEGATIVE
PROTEIN: 30 mg/dL — AB
SPECIFIC GRAVITY, URINE: 1.011 (ref 1.005–1.030)
pH: 5.5 (ref 5.0–8.0)

## 2015-05-30 LAB — PROTIME-INR
INR: 1.85 — AB (ref 0.00–1.49)
PROTHROMBIN TIME: 21.3 s — AB (ref 11.6–15.2)

## 2015-05-30 LAB — BRAIN NATRIURETIC PEPTIDE: B Natriuretic Peptide: 212.3 pg/mL — ABNORMAL HIGH (ref 0.0–100.0)

## 2015-05-30 LAB — I-STAT CG4 LACTIC ACID, ED: Lactic Acid, Venous: 1.32 mmol/L (ref 0.5–2.0)

## 2015-05-30 MED ORDER — DEXTROSE 5 % IV SOLN
1.0000 g | INTRAVENOUS | Status: DC
  Filled 2015-05-30: qty 10

## 2015-05-30 MED ORDER — LABETALOL HCL 200 MG PO TABS
200.0000 mg | ORAL_TABLET | Freq: Two times a day (BID) | ORAL | Status: DC
  Administered 2015-05-30 – 2015-06-18 (×39): 200 mg via ORAL
  Filled 2015-05-30 (×40): qty 1

## 2015-05-30 MED ORDER — LEVOTHYROXINE SODIUM 100 MCG PO TABS
100.0000 ug | ORAL_TABLET | Freq: Every day | ORAL | Status: DC
  Administered 2015-05-31 – 2015-06-19 (×20): 100 ug via ORAL
  Filled 2015-05-30 (×20): qty 1

## 2015-05-30 MED ORDER — WARFARIN - PHARMACIST DOSING INPATIENT: Freq: Every day | Status: DC

## 2015-05-30 MED ORDER — LEVOFLOXACIN 500 MG PO TABS: 500.0000 mg | ORAL_TABLET | Freq: Every day | ORAL | Status: DC

## 2015-05-30 MED ORDER — CETYLPYRIDINIUM CHLORIDE 0.05 % MT LIQD
7.0000 mL | Freq: Two times a day (BID) | OROMUCOSAL | Status: DC
  Administered 2015-05-30 – 2015-06-18 (×38): 7 mL via OROMUCOSAL

## 2015-05-30 MED ORDER — ATORVASTATIN CALCIUM 10 MG PO TABS
10.0000 mg | ORAL_TABLET | Freq: Every day | ORAL | Status: DC
  Administered 2015-05-31 – 2015-06-18 (×19): 10 mg via ORAL
  Filled 2015-05-30 (×21): qty 1

## 2015-05-30 MED ORDER — ONDANSETRON HCL 4 MG/2ML IJ SOLN
4.0000 mg | Freq: Four times a day (QID) | INTRAMUSCULAR | Status: DC | PRN
  Administered 2015-05-30 – 2015-06-03 (×3): 4 mg via INTRAVENOUS
  Filled 2015-05-30 (×3): qty 2

## 2015-05-30 MED ORDER — ONDANSETRON HCL 4 MG/2ML IJ SOLN
4.0000 mg | Freq: Once | INTRAMUSCULAR | Status: AC
  Administered 2015-05-30: 4 mg via INTRAVENOUS
  Filled 2015-05-30: qty 2

## 2015-05-30 MED ORDER — WARFARIN SODIUM 4 MG PO TABS
4.0000 mg | ORAL_TABLET | Freq: Once | ORAL | Status: AC
  Administered 2015-05-30: 4 mg via ORAL
  Filled 2015-05-30: qty 1

## 2015-05-30 MED ORDER — LEVOFLOXACIN IN D5W 750 MG/150ML IV SOLN
750.0000 mg | INTRAVENOUS | Status: DC
  Administered 2015-05-30: 750 mg via INTRAVENOUS
  Filled 2015-05-30: qty 150

## 2015-05-30 MED ORDER — SODIUM CHLORIDE 0.9 % IV SOLN
INTRAVENOUS | Status: DC
  Administered 2015-05-30 – 2015-06-01 (×6): via INTRAVENOUS

## 2015-05-30 MED ORDER — TIMOLOL MALEATE 0.5 % OP SOLN
1.0000 [drp] | Freq: Every morning | OPHTHALMIC | Status: DC
  Administered 2015-05-31 – 2015-06-18 (×19): 1 [drp] via OPHTHALMIC
  Filled 2015-05-30: qty 5

## 2015-05-30 MED ORDER — LATANOPROST 0.005 % OP SOLN
1.0000 [drp] | Freq: Every day | OPHTHALMIC | Status: DC
  Administered 2015-05-30 – 2015-06-18 (×20): 1 [drp] via OPHTHALMIC
  Filled 2015-05-30: qty 2.5

## 2015-05-30 MED ORDER — BRIMONIDINE TARTRATE 0.15 % OP SOLN
1.0000 [drp] | Freq: Three times a day (TID) | OPHTHALMIC | Status: DC
  Administered 2015-05-30 – 2015-06-18 (×58): 1 [drp] via OPHTHALMIC
  Filled 2015-05-30: qty 5

## 2015-05-30 MED ORDER — DILTIAZEM HCL ER COATED BEADS 180 MG PO CP24
360.0000 mg | ORAL_CAPSULE | Freq: Every day | ORAL | Status: DC
  Administered 2015-05-31 – 2015-06-18 (×19): 360 mg via ORAL
  Filled 2015-05-30 (×18): qty 2
  Filled 2015-05-30: qty 1

## 2015-05-30 MED ORDER — AZITHROMYCIN 250 MG PO TABS
500.0000 mg | ORAL_TABLET | ORAL | Status: DC
  Filled 2015-05-30: qty 2

## 2015-05-30 NOTE — ED Notes (Signed)
Bed: [id number] Expected date:  Expected time:  Means of arrival:  Comments: 82 yo- pneumonia

## 2015-05-30 NOTE — ED Notes (Signed)
Hospitalist at bedside 

## 2015-05-30 NOTE — ED Notes (Signed)
Pt. Is unable to use the restroom at this time, but is aware that we need a urine specimen.  

## 2015-05-30 NOTE — ED Notes (Signed)
EDP Madilyn Hookees made aware or Troponin of 0.11

## 2015-05-30 NOTE — Progress Notes (Signed)
ANTICOAGULATION CONSULT NOTE - Initial Consult  Pharmacy Consult for Warfarin Indication: atrial fibrillation  Allergies  Allergen Reactions  . Penicillins Anaphylaxis, Hives and Swelling    Has patient had a PCN reaction causing immediate rash, facial/tongue/throat swelling, SOB or lightheadedness with hypotension:  Has patient had a PCN reaction causing severe rash involving mucus membranes or skin necrosis:  Has patient had a PCN reaction that required hospitalization  Has patient had a PCN reaction occurring within the last 10 years:  If all of the above answers are "NO", then may proceed with Cephalosporin use.   . Sulfa Antibiotics Anaphylaxis and Swelling    Patient Measurements: Weight: 145 lb (65.772 kg)  Vital Signs: Temp: 97.6 F (36.4 C) (03/07 1818) Temp Source: Oral (03/07 1818) BP: 137/60 mmHg (03/07 1923) Pulse Rate: 73 (03/07 1923)  Labs:  Recent Labs  05/30/15 1852  HGB 11.9*  HCT 37.0  PLT 120*  LABPROT 21.3*  INR 1.85*  CREATININE 1.82*    Estimated Creatinine Clearance: 18 mL/min (by C-G formula based on Cr of 1.82).   Medical History: Past Medical History  Diagnosis Date  . Hypertension   . Hyperlipidemia   . Thyroid disease   . Hypothyroidism   . Chronic back pain   . PONV (postoperative nausea and vomiting)   . Chronic a-fib (HCC)   . A-fib (HCC)     Medications:  Scheduled:  . atorvastatin  10 mg Oral Daily  . azithromycin  500 mg Oral Q24H  . brimonidine  1 drop Both Eyes TID  . diltiazem  360 mg Oral Daily  . labetalol  200 mg Oral BID  . latanoprost  1 drop Both Eyes QHS  . levothyroxine  100 mcg Oral Daily  . [START ON 05/31/2015] timolol  1 drop Both Eyes q morning - 10a  . [START ON 05/31/2015] Warfarin - Pharmacist Dosing Inpatient   Does not apply q1800   Infusions:  . sodium chloride    . cefTRIAXone (ROCEPHIN)  IV      Assessment:  82 year old female seen by PCP and chest xray reported as pneumonia.  Patient  presents for treatment.  PMH includes HTN, hyperlipidemia, and AFib for which patient takes warfarin  PTA Warfarin regimen = 3mg  daily except 2mg  MWF; last reported dose taken on 3/6 @ 21:00  INR upon admission = 1.85 (subtherapeutic)  Pharmacy consulted to continue dosing of warfarin      Goal of Therapy:  INR 2-3   Plan:   Warfarin 4mg  po x 1 tonight  Check daily PT/INR  Maryellen PilePoindexter, Aditi Rovira Trefz, PharmD 05/30/2015,9:08 PM

## 2015-05-30 NOTE — ED Provider Notes (Signed)
CSN: [id number]     Arrival date & time 05/30/15  1734 History   First MD Initiated Contact with Patient 05/30/15 1753     Chief Complaint  Patient presents with  . Weakness      Patient is a 82 y.o. female presenting with weakness. The history is provided by the patient. No language interpreter was used.  Weakness   Tonya Farley is a 82 y.o. female who presents to the Emergency Department complaining of weakness.  She reports 4 days of cough, nausea, decreased oral intake, generalized weakness. She saw her PCP yesterday who did a chest x-ray did demonstrate pneumonia and she was started on Levaquin. He had labs drawn and family states that her potassium was low and her BUN was up. Sxs are moderate, constant, worsening.  She started having diarrhea today.  She takes coumadin for afib.   Past Medical History  Diagnosis Date  . Hypertension   . Hyperlipidemia   . Thyroid disease   . Hypothyroidism   . Chronic back pain   . PONV (postoperative nausea and vomiting)   . Chronic a-fib (HCC)   . A-fib [facility])    Past Surgical History  Procedure Laterality Date  . Appendectomy    . Tonsillectomy    . Cataracts      bilateral  . Eye surgery      left eye "hole"   Family History  Problem Relation Age of Onset  . Heart disease Sister   . Heart disease Sister   . Cancer Mother   . Other Father   . Heart attack Father    Social History  Substance Use Topics  . Smoking status: Never Smoker   . Smokeless tobacco: Never Used  . Alcohol Use: No   OB History    No data available     Review of Systems  Neurological: Positive for weakness.  All other systems reviewed and are negative.     Allergies  Penicillins and Sulfa antibiotics  Home Medications   Prior to Admission medications   Medication Sig Start Date End Date Taking? Authorizing Provider  alendronate (FOSAMAX) 70 MG tablet Take 70 mg by mouth once a week. Take with a full glass of water on an empty stomach.    Yes Historical Provider, MD  atorvastatin (LIPITOR) 10 MG tablet Take 10 mg by mouth daily.   Yes Historical Provider, MD  B Complex-C (B-COMPLEX WITH VITAMIN C) tablet Take 1 tablet by mouth daily.   Yes Historical Provider, MD  brimonidine (ALPHAGAN) 0.15 % ophthalmic solution Place 1 drop into both eyes 3 (three) times daily.   Yes Historical Provider, MD  Cholecalciferol (VITAMIN D PO) Take by mouth.   Yes Historical Provider, MD  diltiazem (CARDIZEM CD) 360 MG 24 hr capsule Take 1 capsule (360 mg total) by mouth daily. 05/18/15  Yes Corky Crafts, MD  furosemide (LASIX) 40 MG tablet Take 40 mg by mouth daily.    Yes Historical Provider, MD  labetalol (NORMODYNE) 200 MG tablet Take 1 tablet (200 mg total) by mouth 2 (two) times daily. 04/12/15  Yes Corky Crafts, MD  latanoprost (XALATAN) 0.005 % ophthalmic solution Place 1 drop into both eyes at bedtime. 01/10/14  Yes Historical Provider, MD  levofloxacin (LEVAQUIN) 500 MG tablet Take 500 mg by mouth daily. 05/29/15  Yes Historical Provider, MD  levothyroxine (SYNTHROID, LEVOTHROID) 100 MCG tablet Take 100 mcg by mouth daily.   Yes Historical Provider, MD  Polyethyl  Glycol-Propyl Glycol (SYSTANE OP) Apply 2 drops to eye daily as needed (dry eyes).    Yes Historical Provider, MD  potassium chloride SA (K-DUR,KLOR-CON) 20 MEQ tablet Take 20 mEq by mouth daily.   Yes Historical Provider, MD  timolol (TIMOPTIC) 0.5 % ophthalmic solution Place 1 drop into both eyes every morning.  12/28/13  Yes Historical Provider, MD  warfarin (COUMADIN) 1 MG tablet Take as directed by coumadin clinic Patient taking differently: Take 2-3 mg by mouth See admin instructions. Take 3 mg once daily on Sunday, Tuesday, Thursday, and Saturday.  Take 2 mg once daily on Monday, Wednesday, and Friday. 12/15/14  Yes Corky Crafts, MD   BP 142/63 mmHg  Pulse 76  Temp(Src) 97.7 F (36.5 C) (Oral)  Resp 20  Ht  (1.626 m)  Wt 146 lb 3.2 oz (66.316 kg)   BMI 25.08 kg/m2  SpO2 93% Physical Exam  Constitutional: She is oriented to person, place, and time. She appears well-developed and well-nourished.  HENT:  Head: Normocephalic and atraumatic.  Cardiovascular: Normal rate.   No murmur heard. Irregular rhythm  Pulmonary/Chest: Effort normal. No respiratory distress.  Diffuse rales  Abdominal: Soft. There is no tenderness. There is no rebound and no guarding.  Musculoskeletal: She exhibits no edema or tenderness.  Neurological: She is alert and oriented to person, place, and time.  Skin: Skin is warm and dry.  Psychiatric: She has a normal mood and affect. Her behavior is normal.  Nursing note and vitals reviewed.   ED Course  Procedures (including critical care time) Labs Review Labs Reviewed  CBC WITH DIFFERENTIAL/PLATELET - Abnormal; Notable for the following:    Hemoglobin 11.9 (*)    RDW 16.6 (*)    Platelets 120 (*)    All other components within normal limits  COMPREHENSIVE METABOLIC PANEL - Abnormal; Notable for the following:    Potassium 3.3 (*)    Chloride 98 (*)    Glucose, Bld 123 (*)    BUN 38 (*)    Creatinine, Ser 1.82 (*)    Calcium 8.3 (*)    Total Protein 6.0 (*)    AST 53 (*)    GFR calc non Af Amer 23 (*)    GFR calc Af Amer 26 (*)    All other components within normal limits  URINALYSIS, ROUTINE W REFLEX MICROSCOPIC (NOT AT [facility]) - Abnormal; Notable for the following:    APPearance CLOUDY (*)    Hgb urine dipstick TRACE (*)    Protein, ur 30 (*)    All other components within normal limits  PROTIME-INR - Abnormal; Notable for the following:    Prothrombin Time 21.3 (*)    INR 1.85 (*)    All other components within normal limits  BRAIN NATRIURETIC PEPTIDE - Abnormal; Notable for the following:    B Natriuretic Peptide 212.3 (*)    All other components within normal limits  URINE MICROSCOPIC-ADD ON - Abnormal; Notable for the following:    Squamous Epithelial / LPF 0-5 (*)    Bacteria, UA RARE  (*)    All other components within normal limits  I-STAT TROPOININ, ED - Abnormal; Notable for the following:    Troponin i, poc 0.11 (*)    All other components within normal limits  CULTURE, BLOOD (ROUTINE X 2)  CULTURE, BLOOD (ROUTINE X 2)  CULTURE, EXPECTORATED SPUTUM-ASSESSMENT  GRAM STAIN  PROTIME-INR  HIV ANTIBODY (ROUTINE TESTING)  STREP PNEUMONIAE URINARY ANTIGEN  CBC  BASIC  METABOLIC PANEL  TROPONIN I  TROPONIN I  TROPONIN I  I-STAT CG4 LACTIC ACID, ED    Imaging Review Dg Chest 2 View  05/30/2015  CLINICAL DATA:  Pt here from home, she was seen at PCP and had a chest xray that showed pneumonia and she was told to come to ED. EXAM: CHEST  2 VIEW COMPARISON:  05/29/2015 FINDINGS: Lung base opacity is noted, less prominent than on the previous day's study. On the lateral view this appears to be streaky peribronchovascular opacity, seen to a lesser degree in the perihilar regions. This is consistent with bronchial inflammation. May be all chronic bronchitic change. A component acute bronchitis is possible. There is no convincing pneumonia. No pulmonary edema. No pleural effusion or pneumothorax. Cardiac silhouette is mildly enlarged. No mediastinal or hilar masses or convincing adenopathy. Bony thorax is demineralized. Vertebroplasty has been performed in upper lumbar vertebrae, stable from the prior study. IMPRESSION: 1. No convincing pneumonia. 2. Findings described above may be due to chronic bronchitic change. Superimposed acute bronchitis is possible. Electronically Signed   By: Amie Portlandavid  Ormond M.D.   On: 05/30/2015 19:22   Dg Chest 2 View  05/29/2015  CLINICAL DATA:  82 year old presenting with acute onset of cough and nausea. EXAM: CHEST  2 VIEW COMPARISON:  01/10/2015 and earlier. FINDINGS: Suboptimal inspiration accounts for crowded bronchovascular markings, especially in the bases, and accentuates the cardiac silhouette. Taking this into account, cardiac silhouette moderately  enlarged, unchanged. Thoracic aorta tortuous atherosclerotic, unchanged. Hilar and mediastinal contours otherwise unremarkable. Bronchiectasis involving the lower lobes, right greater than left. Marked central peribronchial thickening, more so than on the prior examination. Airspace consolidation in both lower lobes, right greater than left. Possible small bilateral pleural effusions. Pulmonary vascularity normal. Exaggeration of the usual thoracic kyphosis in part related to old compression fractures of L1 with augmentation and of T12. IMPRESSION: Acute bilateral lower lobe pneumonia superimposed upon moderate to severe changes of acute bronchitis and/or asthma. Electronically Signed   By: Hulan Saashomas  Lawrence M.D.   On: 05/29/2015 16:51   I have personally reviewed and evaluated these images and lab results as part of my medical decision-making.   EKG Interpretation   Date/Time:  Tuesday May 30 2015 18:43:33 EST Ventricular Rate:  84 PR Interval:    QRS Duration: 117 QT Interval:  397 QTC Calculation: 469 R Axis:   -63 Text Interpretation:  Atrial fibrillation Left anterior fascicular block  LVH with secondary repolarization abnormality Anterior Q waves, possibly  due to LVH Confirmed by Lincoln Brighamees, Liz [id number]) on 05/30/2015 7:28:56 PM      MDM   Final diagnoses:  Dyspnea    Patient here for evaluation of cough, nausea, decreased oral intake, outpatient x-ray with pneumonia. Patient with considerable rales on examination, hypoxia. Examination and history is more consistent with congestive heart failure over pneumonia. BMP with acute kidney injury.  Plan to admit for further treatment.    Tilden FossaElizabeth Tashala Cumbo, MD 05/31/15 [phone]

## 2015-05-30 NOTE — ED Notes (Signed)
Pt presents with c/o weakness. Pt has intermittent nausea but no vomiting. Family believes pt may be dehydrated. Pt has a 20g IV in her right hand, 4 mg zofran given en route by EMS. Rales heard in all lung fields on expiration by EMS. Abnormal lab values by the doctor but unsure of what the abnormal values are.

## 2015-05-30 NOTE — ED Notes (Addendum)
Pt here from home, she was seen at PCP and had a chest xray that showed pneumonia and she was told to come to ED.

## 2015-05-30 NOTE — H&P (Addendum)
[facility] History and Physical  Tonya Farley UJW:[id number] DOB: 12/11/1919 DOA: 05/30/2015  Referring physician: EDP PCP: Lorenda PeckOBERTS, RONALD WAYNE, MD   Chief Complaint: Weakness, Cough   HPI: Tonya Farley is a 82 y.o. female who presents to the ED with c/o weakness.  She has had 4 day history of cough, nausea, decreased PO intake, generalized weakness.  All of this onset late last week as a URI which then moved down into her chest.  She saw her PCP yesterday who did a CXR (which is available on our Epic system) which demonstrated BLL PNA), labs were drawn this morning which showed dehydration with K of 2.5, elevated BUN.  Patient was sent to the ED.  Daughter gave patient Gatorade in the mean time.  Review of Systems: Systems reviewed.  As above, otherwise negative  Past Medical History  Diagnosis Date  . Hypertension   . Hyperlipidemia   . Thyroid disease   . Hypothyroidism   . Chronic back pain   . PONV (postoperative nausea and vomiting)   . Chronic a-fib (HCC)   . A-fib [facility](HCC)    Past Surgical History  Procedure Laterality Date  . Appendectomy    . Tonsillectomy    . Cataracts      bilateral  . Eye surgery      left eye "hole"   Social History:  reports that she has never smoked. She has never used smokeless tobacco. She reports that she does not drink alcohol or use illicit drugs.  Allergies  Allergen Reactions  . Penicillins Anaphylaxis, Hives and Swelling       . Sulfa Antibiotics Anaphylaxis and Swelling    Family History  Problem Relation Age of Onset  . Heart disease Sister   . Heart disease Sister   . Cancer Mother   . Other Father   . Heart attack Father      Prior to Admission medications   Medication Sig Start Date End Date Taking? Authorizing Provider  alendronate (FOSAMAX) 70 MG tablet Take 70 mg by mouth once a week. Take with a full glass of water on an empty stomach.   Yes Historical Provider, MD  atorvastatin (LIPITOR) 10 MG  tablet Take 10 mg by mouth daily.   Yes Historical Provider, MD  B Complex-C (B-COMPLEX WITH VITAMIN C) tablet Take 1 tablet by mouth daily.   Yes Historical Provider, MD  brimonidine (ALPHAGAN) 0.15 % ophthalmic solution Place 1 drop into both eyes 3 (three) times daily.   Yes Historical Provider, MD  Cholecalciferol (VITAMIN D PO) Take by mouth.   Yes Historical Provider, MD  diltiazem (CARDIZEM CD) 360 MG 24 hr capsule Take 1 capsule (360 mg total) by mouth daily. 05/18/15  Yes Corky CraftsJayadeep S Varanasi, MD  furosemide (LASIX) 40 MG tablet Take 40 mg by mouth daily.    Yes Historical Provider, MD  labetalol (NORMODYNE) 200 MG tablet Take 1 tablet (200 mg total) by mouth 2 (two) times daily. 04/12/15  Yes Corky CraftsJayadeep S Varanasi, MD  latanoprost (XALATAN) 0.005 % ophthalmic solution Place 1 drop into both eyes at bedtime. 01/10/14  Yes Historical Provider, MD  levofloxacin (LEVAQUIN) 500 MG tablet Take 500 mg by mouth daily. 05/29/15  Yes Historical Provider, MD  levothyroxine (SYNTHROID, LEVOTHROID) 100 MCG tablet Take 100 mcg by mouth daily.   Yes Historical Provider, MD  Polyethyl Glycol-Propyl Glycol (SYSTANE OP) Apply 2 drops to eye daily as needed (dry eyes).    Yes Historical Provider, MD  potassium chloride SA (K-DUR,KLOR-CON) 20 MEQ tablet Take 20 mEq by mouth daily.   Yes Historical Provider, MD  timolol (TIMOPTIC) 0.5 % ophthalmic solution Place 1 drop into both eyes every morning.  12/28/13  Yes Historical Provider, MD  warfarin (COUMADIN) 1 MG tablet Take as directed by coumadin clinic Patient taking differently: Take 2-3 mg by mouth See admin instructions. Take 3 mg once daily on Sunday, Tuesday, Thursday, and Saturday.  Take 2 mg once daily on Monday, Wednesday, and Friday. 12/15/14  Yes Corky Crafts, MD   Physical Exam: Filed Vitals:   05/30/15 1818 05/30/15 1923  BP: 143/73 137/60  Pulse: 76 73  Temp: 97.6 F (36.4 C)   Resp: 16 21    BP 137/60 mmHg  Pulse 73  Temp(Src) 97.6 F  (36.4 C) (Oral)  Resp 21  Wt 65.772 kg (145 lb)  SpO2 93%  General Appearance:    Alert, oriented, no distress, appears stated age  Head:    Normocephalic, atraumatic  Eyes:    PERRL, EOMI, sclera non-icteric        Nose:   Nares without drainage or epistaxis. Mucosa, turbinates normal  Throat:   Moist mucous membranes. Oropharynx without erythema or exudate.  Neck:   Supple. No carotid bruits.  No thyromegaly.  No lymphadenopathy.   Back:     No CVA tenderness, no spinal tenderness  Lungs:     Clear to auscultation bilaterally, without wheezes, rhonchi or rales  Chest wall:    No tenderness to palpitation  Heart:    Regular rate and rhythm without murmurs, gallops, rubs  Abdomen:     Soft, non-tender, nondistended, normal bowel sounds, no organomegaly  Genitalia:    deferred  Rectal:    deferred  Extremities:   No clubbing, cyanosis or edema.  Pulses:   2+ and symmetric all extremities  Skin:   Skin color, texture, turgor normal, no rashes or lesions  Lymph nodes:   Cervical, supraclavicular, and axillary nodes normal  Neurologic:   CNII-XII intact. Normal strength, sensation and reflexes      throughout    Labs on Admission:  Basic Metabolic Panel:  Recent Labs Lab 05/30/15 1852  NA 135  K 3.3*  CL 98*  CO2 26  GLUCOSE 123*  BUN 38*  CREATININE 1.82*  CALCIUM 8.3*   Liver Function Tests:  Recent Labs Lab 05/30/15 1852  AST 53*  ALT 33  ALKPHOS 54  BILITOT 0.8  PROT 6.0*  ALBUMIN 3.5   No results for input(s): LIPASE, AMYLASE in the last 168 hours. No results for input(s): AMMONIA in the last 168 hours. CBC:  Recent Labs Lab 05/30/15 1852  WBC 4.7  NEUTROABS 3.2  HGB 11.9*  HCT 37.0  MCV 85.1  PLT 120*   Cardiac Enzymes: No results for input(s): CKTOTAL, CKMB, CKMBINDEX, TROPONINI in the last 168 hours.  BNP (last 3 results) No results for input(s): PROBNP in the last 8760 hours. CBG: No results for input(s): GLUCAP in the last 168  hours.  Radiological Exams on Admission: Dg Chest 2 View  05/30/2015  CLINICAL DATA:  Pt here from home, she was seen at PCP and had a chest xray that showed pneumonia and she was told to come to ED. EXAM: CHEST  2 VIEW COMPARISON:  05/29/2015 FINDINGS: Lung base opacity is noted, less prominent than on the previous day's study. On the lateral view this appears to be streaky peribronchovascular opacity, seen to a lesser  degree in the perihilar regions. This is consistent with bronchial inflammation. May be all chronic bronchitic change. A component acute bronchitis is possible. There is no convincing pneumonia. No pulmonary edema. No pleural effusion or pneumothorax. Cardiac silhouette is mildly enlarged. No mediastinal or hilar masses or convincing adenopathy. Bony thorax is demineralized. Vertebroplasty has been performed in upper lumbar vertebrae, stable from the prior study. IMPRESSION: 1. No convincing pneumonia. 2. Findings described above may be due to chronic bronchitic change. Superimposed acute bronchitis is possible. Electronically Signed   By: Amie Portland M.D.   On: 05/30/2015 19:22   Dg Chest 2 View  05/29/2015  CLINICAL DATA:  82 year old presenting with acute onset of cough and nausea. EXAM: CHEST  2 VIEW COMPARISON:  01/10/2015 and earlier. FINDINGS: Suboptimal inspiration accounts for crowded bronchovascular markings, especially in the bases, and accentuates the cardiac silhouette. Taking this into account, cardiac silhouette moderately enlarged, unchanged. Thoracic aorta tortuous atherosclerotic, unchanged. Hilar and mediastinal contours otherwise unremarkable. Bronchiectasis involving the lower lobes, right greater than left. Marked central peribronchial thickening, more so than on the prior examination. Airspace consolidation in both lower lobes, right greater than left. Possible small bilateral pleural effusions. Pulmonary vascularity normal. Exaggeration of the usual thoracic kyphosis in  part related to old compression fractures of L1 with augmentation and of T12. IMPRESSION: Acute bilateral lower lobe pneumonia superimposed upon moderate to severe changes of acute bronchitis and/or asthma. Electronically Signed   By: Hulan Saas M.D.   On: 05/29/2015 16:51    EKG: Independently reviewed.  Assessment/Plan Principal Problem:   CAP (community acquired pneumonia) Active Problems:   Atrial fibrillation (HCC)   1. CAP - 1. PNA pathway 2. levaquin due to anaphylaxis history with PCN 1. Keep an eye on diarrhea which onset today, not concerned for C.Diff yet but this is a possibility.  Not really a higher risk patient though as she lives alone at home still. 2. Dehydration - 1. IVF for dehydration 2. Repeat BMP in AM 3. Hold Lasix 3. A.Fib 1. Continue coumadin 2. Rate control 3. Tele monitor 4. Slight trop bump - 1. No chest pain, no other symptoms of ACS 2. Serial trops to see where this goes.    Code Status: DNR Family Communication: Family at bedside Disposition Plan: Admit to inpatient   Time spent: 70 min  GARDNER, JARED M. [facility] Pager [phone]  If 7AM-7PM, please contact the day team taking care of the patient [email or web address] Password [facility] 05/30/2015, 9:16 PM

## 2015-05-31 DIAGNOSIS — E876 Hypokalemia: Secondary | ICD-10-CM

## 2015-05-31 LAB — STREP PNEUMONIAE URINARY ANTIGEN: STREP PNEUMO URINARY ANTIGEN: NEGATIVE

## 2015-05-31 LAB — BASIC METABOLIC PANEL
ANION GAP: 11 (ref 5–15)
BUN: 30 mg/dL — ABNORMAL HIGH (ref 6–20)
CALCIUM: 8.1 mg/dL — AB (ref 8.9–10.3)
CO2: 27 mmol/L (ref 22–32)
CREATININE: 1.48 mg/dL — AB (ref 0.44–1.00)
Chloride: 101 mmol/L (ref 101–111)
GFR, EST AFRICAN AMERICAN: 34 mL/min — AB (ref >60)
GFR, EST NON AFRICAN AMERICAN: 29 mL/min — AB (ref >60)
Glucose, Bld: 108 mg/dL — ABNORMAL HIGH (ref 65–99)
Potassium: 3.2 mmol/L — ABNORMAL LOW (ref 3.5–5.1)
SODIUM: 139 mmol/L (ref 135–145)

## 2015-05-31 LAB — CBC
HCT: 35.9 % — ABNORMAL LOW (ref 36.0–46.0)
Hemoglobin: 11.4 g/dL — ABNORMAL LOW (ref 12.0–15.0)
MCH: 26.7 pg (ref 26.0–34.0)
MCHC: 31.8 g/dL (ref 30.0–36.0)
MCV: 84.1 fL (ref 78.0–100.0)
PLATELETS: 114 10*3/uL — AB (ref 150–400)
RBC: 4.27 MIL/uL (ref 3.87–5.11)
RDW: 16.3 % — AB (ref 11.5–15.5)
WBC: 4 10*3/uL (ref 4.0–10.5)

## 2015-05-31 LAB — PROTIME-INR
INR: 1.94 — AB (ref 0.00–1.49)
PROTHROMBIN TIME: 22 s — AB (ref 11.6–15.2)

## 2015-05-31 LAB — HIV ANTIBODY (ROUTINE TESTING W REFLEX): HIV SCREEN 4TH GENERATION: NONREACTIVE

## 2015-05-31 LAB — INFLUENZA PANEL BY PCR (TYPE A & B)
H1N1FLUPCR: NOT DETECTED
Influenza A By PCR: POSITIVE — AB
Influenza B By PCR: NEGATIVE

## 2015-05-31 LAB — TROPONIN I
TROPONIN I: 0.07 ng/mL — AB (ref <0.031)
TROPONIN I: 0.09 ng/mL — AB (ref <0.031)
Troponin I: 0.07 ng/mL — ABNORMAL HIGH (ref <0.031)

## 2015-05-31 MED ORDER — LEVOFLOXACIN IN D5W 750 MG/150ML IV SOLN
750.0000 mg | INTRAVENOUS | Status: DC
  Administered 2015-06-01 – 2015-06-03 (×2): 750 mg via INTRAVENOUS
  Filled 2015-05-31 (×2): qty 150

## 2015-05-31 MED ORDER — POTASSIUM CHLORIDE CRYS ER 20 MEQ PO TBCR
40.0000 meq | EXTENDED_RELEASE_TABLET | Freq: Once | ORAL | Status: AC
  Administered 2015-05-31: 40 meq via ORAL
  Filled 2015-05-31: qty 2

## 2015-05-31 MED ORDER — WARFARIN SODIUM 2 MG PO TABS
2.0000 mg | ORAL_TABLET | Freq: Once | ORAL | Status: AC
  Administered 2015-05-31: 2 mg via ORAL
  Filled 2015-05-31: qty 1

## 2015-05-31 MED ORDER — ENSURE ENLIVE PO LIQD
237.0000 mL | Freq: Two times a day (BID) | ORAL | Status: DC
  Administered 2015-05-31 – 2015-06-04 (×5): 237 mL via ORAL

## 2015-05-31 NOTE — Care Management Note (Signed)
Case Management Note  Patient Details  Name: Driscilla MoatsMamie N Bechler MRN: [id number] Date of Birth: 10/07/1919  Subjective/Objective: 82 y/o f admitted w/PNA. Hx: DNR. From home. PT cons-await recc.                   Action/Plan:d/c plan home.   Expected Discharge Date:                  Expected Discharge Plan:  Home/Self Care  In-House Referral:     Discharge planning Services  CM Consult  Post Acute Care Choice:    Choice offered to:     DME Arranged:    DME Agency:     HH Arranged:    HH Agency:     Status of Service:  In process, will continue to follow  Medicare Important Message Given:    Date Medicare IM Given:    Medicare IM give by:    Date Additional Medicare IM Given:    Additional Medicare Important Message give by:     If discussed at Long Length of Stay Meetings, dates discussed:    Additional Comments:  Lanier ClamMahabir, Torben Soloway, RN 05/31/2015, 2:52 PM

## 2015-05-31 NOTE — Progress Notes (Signed)
ANTICOAGULATION CONSULT NOTE - follow up  Pharmacy Consult for Warfarin Indication: atrial fibrillation  Allergies  Allergen Reactions  . Penicillins Anaphylaxis, Hives and Swelling       . Sulfa Antibiotics Anaphylaxis and Swelling    Patient Measurements: Height: 5\' 4"  (162.6 cm) Weight: 146 lb 3.2 oz (66.316 kg) IBW/kg (Calculated) : 54.7  Vital Signs: Temp: 97.9 F (36.6 C) (03/08 0547) Temp Source: Oral (03/08 0547) BP: 137/70 mmHg (03/08 0547) Pulse Rate: 87 (03/08 0547)  Labs:  Recent Labs  05/30/15 1852 05/31/15 0014 05/31/15 0545  HGB 11.9*  --  11.4*  HCT 37.0  --  35.9*  PLT 120*  --  114*  LABPROT 21.3*  --  22.0*  INR 1.85*  --  1.94*  CREATININE 1.82*  --  1.48*  TROPONINI  --  0.09* 0.07*    Estimated Creatinine Clearance: 22.2 mL/min (by C-G formula based on Cr of 1.48).   Medical History: Past Medical History  Diagnosis Date  . Hypertension   . Hyperlipidemia   . Thyroid disease   . Hypothyroidism   . Chronic back pain   . PONV (postoperative nausea and vomiting)   . Chronic a-fib (HCC)   . A-fib (HCC)     Medications:  Scheduled:  . antiseptic oral rinse  7 mL Mouth Rinse BID  . atorvastatin  10 mg Oral Daily  . brimonidine  1 drop Both Eyes TID  . diltiazem  360 mg Oral Daily  . feeding supplement (ENSURE ENLIVE)  237 mL Oral BID BM  . labetalol  200 mg Oral BID  . latanoprost  1 drop Both Eyes QHS  . [START ON 06/01/2015] levofloxacin (LEVAQUIN) IV  750 mg Intravenous Q48H  . levothyroxine  100 mcg Oral QAC breakfast  . timolol  1 drop Both Eyes q morning - 10a  . Warfarin - Pharmacist Dosing Inpatient   Does not apply q1800   Infusions:  . sodium chloride 125 mL/hr at 05/31/15 0640    Assessment: 82 year old female seen by PCP and chest xray reported as pneumonia.  Patient presents for treatment.  PMH includes HTN, hyperlipidemia, and AFib for which patient takes warfarin. PTA Warfarin regimen = 3mg  daily except 2mg  MWF;  last reported dose taken on 3/6 @ 21:00. INR upon admission = 1.85 (subtherapeutic). Pharmacy consulted to continue dosing of warfarin  Today, 05/31/2015  INR just below goal  No reported bleeding  Hgb stable, Plt low but stable      Goal of Therapy:  INR 2-3   Plan:  1) 2mg  warfarin tonight at 6pm as per home regimen above 2) daily INR   Hessie KnowsJustin M Delphina Schum, PharmD, BCPS Pager [phone] 05/31/2015 8:39 AM

## 2015-05-31 NOTE — Progress Notes (Signed)
PT Cancellation Note  Patient Details Name: Driscilla MoatsMamie N Pardi MRN: [id number] DOB: 02/01/1920   Cancelled Treatment:    Reason Eval/Treat Not Completed: Patient declined, no reason specified. NT stated pt had been nauseous today. Will check back another day.    Rebeca AlertJannie Akira Adelsberger, MPT Pager: [phone]

## 2015-05-31 NOTE — Progress Notes (Signed)
[facility]                                                                              Patient Demographics  Tonya Farley, is a 82 y.o. female, DOB - 09/15/1919, WJX:[id number]  Admit date - 05/30/2015   Admitting Physician Hillary BowJared M Gardner, DO  Outpatient Primary MD for the patient is ROBERTS, Vernie AmmonsONALD WAYNE, MD  LOS - 1   Chief Complaint  Patient presents with  . Weakness      HPI on 05/30/2015 by Dr. Lyda PeroneJared Gardner Tonya MoatsMamie N Farley is a 82 y.o. female who presents to the ED with c/o weakness. She has had 4 day history of cough, nausea, decreased PO intake, generalized weakness. All of this onset late last week as a URI which then moved down into her chest. She saw her PCP yesterday who did a CXR (which is available on our Epic system) which demonstrated BLL PNA), labs were drawn this morning which showed dehydration with K of 2.5, elevated BUN. Patient was sent to the ED. Daughter gave patient Gatorade in the mean time.  Assessment & Plan   Community acquired pneumonia/ Influenza A -Chest x-ray 05/29/2015 did show: Acute bilateral lower lobe pneumonia -Chest x-ray 05/30/2015 showed acute bronchitis -Influenza A positive -Continue Levaquin, patient has been insulin and sulfa allergies -Will not start Tamiflu at this time as patient's symptoms have been ongoing for more than 5 days  Acute on chronic kidney disease, stage III -Upon admission, creatinine 1.82.  -Currently 1.48 -Continue IV fluids -Monitor BMP  Hypokalemia -Continue to replace, monitor BMP -Will obtain magnesium level  Atrial fibrillation -Continue Coumadin per pharmacy -Continue diltiazem, labetalol -CHADSVASC  Mildly elevated troponin -Likely secondary to above -Currently denies chest pain -Peak troponin 0.11, currently trending downward 0.07  Code Status: DNR  Family Communication: None at bedside  Disposition Plan: Admitted.  Time Spent in minutes   30 minutes  Procedures   None  Consults   None  DVT Prophylaxis  Coumadin  Lab Results  Component Value Date   PLT 114* 05/31/2015    Medications  Scheduled Meds: . antiseptic oral rinse  7 mL Mouth Rinse BID  . atorvastatin  10 mg Oral Daily  . brimonidine  1 drop Both Eyes TID  . diltiazem  360 mg Oral Daily  . feeding supplement (ENSURE ENLIVE)  237 mL Oral BID BM  . labetalol  200 mg Oral BID  . latanoprost  1 drop Both Eyes QHS  . [START ON 06/01/2015] levofloxacin (LEVAQUIN) IV  750 mg Intravenous Q48H  . levothyroxine  100 mcg Oral QAC breakfast  . timolol  1 drop Both Eyes q morning - 10a  . warfarin  2 mg Oral ONCE-1800  . Warfarin - Pharmacist Dosing Inpatient   Does not apply q1800   Continuous Infusions: . sodium chloride 125 mL/hr at 05/31/15 0640   PRN Meds:.ondansetron (ZOFRAN) IV  Antibiotics    Anti-infectives    Start     Dose/Rate Route Frequency Ordered Stop   06/01/15 2200  levofloxacin (LEVAQUIN) IVPB 750 mg     750 mg 100 mL/hr over 90 Minutes Intravenous Every 48 hours 05/31/15 0747  05/30/15 2130  levofloxacin (LEVAQUIN) IVPB 750 mg  Status:  Discontinued     750 mg 100 mL/hr over 90 Minutes Intravenous Every 24 hours 05/30/15 2120 05/31/15 0747   05/30/15 2115  levofloxacin (LEVAQUIN) tablet 500 mg  Status:  Discontinued     500 mg Oral Daily 05/30/15 2103 05/30/15 2104   05/30/15 2115  cefTRIAXone (ROCEPHIN) 1 g in dextrose 5 % 50 mL IVPB  Status:  Discontinued     1 g 100 mL/hr over 30 Minutes Intravenous Every 24 hours 05/30/15 2108 05/30/15 2118   05/30/15 2115  azithromycin (ZITHROMAX) tablet 500 mg  Status:  Discontinued     500 mg Oral Every 24 hours 05/30/15 2108 05/30/15 2118       Subjective:   Tonya Farley seen and examined today.  Patient states she is feeling mildly better. Denies current chest pain. Feels breathing has improved. Continues to have mild cough. Denies any abdominal pain, nausea or vomiting, diarrhea constipation, dizziness or  headache.    Objective:   Filed Vitals:   05/30/15 1923 05/30/15 2130 05/30/15 2240 05/31/15 0547  BP: 137/60 141/64 142/63 137/70  Pulse: 73 82 76 87  Temp:   97.7 F (36.5 C) 97.9 F (36.6 C)  TempSrc:   Oral Oral  Resp: Height:    (1.626 m)   Weight:   66.316 kg (146 lb 3.2 oz)   SpO2: 93% 92% 93% 99%    Wt Readings from Last 3 Encounters:  05/30/15 66.316 kg (146 lb 3.2 oz)  01/10/15 68.04 kg (150 lb)  08/08/14 70.217 kg (154 lb 12.8 oz)     Intake/Output Summary (Last 24 hours) at 05/31/15 1408 Last data filed at 05/31/15 1217  Gross per 24 hour  Intake 1260.42 ml  Output    700 ml  Net 560.42 ml    Exam  General: Well developed, well nourished, NAD, appears stated age  HEENT: NCAT,mucous membranes moist.   Cardiovascular: S1 S2 auscultated, IRR   Respiratory: Course breath sounds, occ cough  Abdomen: Soft, nontender, nondistended, + bowel sounds  Extremities: warm dry without cyanosis clubbing or edema  Neuro: AAOx3, nonfocal  Psych: Normal affect and demeanor  Data Review   Micro Results No results found for this or any previous visit (from the past 240 hour(s)).  Radiology Reports Dg Chest 2 View  05/30/2015  CLINICAL DATA:  Pt here from home, she was seen at PCP and had a chest xray that showed pneumonia and she was told to come to ED. EXAM: CHEST  2 VIEW COMPARISON:  05/29/2015 FINDINGS: Lung base opacity is noted, less prominent than on the previous day's study. On the lateral view this appears to be streaky peribronchovascular opacity, seen to a lesser degree in the perihilar regions. This is consistent with bronchial inflammation. May be all chronic bronchitic change. A component acute bronchitis is possible. There is no convincing pneumonia. No pulmonary edema. No pleural effusion or pneumothorax. Cardiac silhouette is mildly enlarged. No mediastinal or hilar masses or convincing adenopathy. Bony thorax is demineralized.  Vertebroplasty has been performed in upper lumbar vertebrae, stable from the prior study. IMPRESSION: 1. No convincing pneumonia. 2. Findings described above may be due to chronic bronchitic change. Superimposed acute bronchitis is possible. Electronically Signed   By: Amie Portland M.D.   On: 05/30/2015 19:22   Dg Chest 2 View  05/29/2015  CLINICAL DATA:  82 year old presenting with acute onset of cough  and nausea. EXAM: CHEST  2 VIEW COMPARISON:  01/10/2015 and earlier. FINDINGS: Suboptimal inspiration accounts for crowded bronchovascular markings, especially in the bases, and accentuates the cardiac silhouette. Taking this into account, cardiac silhouette moderately enlarged, unchanged. Thoracic aorta tortuous atherosclerotic, unchanged. Hilar and mediastinal contours otherwise unremarkable. Bronchiectasis involving the lower lobes, right greater than left. Marked central peribronchial thickening, more so than on the prior examination. Airspace consolidation in both lower lobes, right greater than left. Possible small bilateral pleural effusions. Pulmonary vascularity normal. Exaggeration of the usual thoracic kyphosis in part related to old compression fractures of L1 with augmentation and of T12. IMPRESSION: Acute bilateral lower lobe pneumonia superimposed upon moderate to severe changes of acute bronchitis and/or asthma. Electronically Signed   By: Hulan Saas M.D.   On: 05/29/2015 16:51    CBC  Recent Labs Lab 05/30/15 1852 05/31/15 0545  WBC 4.7 4.0  HGB 11.9* 11.4*  HCT 37.0 35.9*  PLT 120* 114*  MCV 85.1 84.1  MCH 27.4 26.7  MCHC 32.2 31.8  RDW 16.6* 16.3*  LYMPHSABS 0.8  --   MONOABS 0.7  --   EOSABS 0.0  --   BASOSABS 0.0  --     Chemistries   Recent Labs Lab 05/30/15 1852 05/31/15 0545  NA 135 139  K 3.3* 3.2*  CL 98* 101  CO2 26 27  GLUCOSE 123* 108*  BUN 38* 30*  CREATININE 1.82* 1.48*  CALCIUM 8.3* 8.1*  AST 53*  --   ALT 33  --   ALKPHOS 54  --    BILITOT 0.8  --    ------------------------------------------------------------------------------------------------------------------ estimated creatinine clearance is 22.2 mL/min (by C-G formula based on Cr of 1.48). ------------------------------------------------------------------------------------------------------------------ No results for input(s): HGBA1C in the last 72 hours. ------------------------------------------------------------------------------------------------------------------ No results for input(s): CHOL, HDL, LDLCALC, TRIG, CHOLHDL, LDLDIRECT in the last 72 hours. ------------------------------------------------------------------------------------------------------------------ No results for input(s): TSH, T4TOTAL, T3FREE, THYROIDAB in the last 72 hours.  Invalid input(s): FREET3 ------------------------------------------------------------------------------------------------------------------ No results for input(s): VITAMINB12, FOLATE, FERRITIN, TIBC, IRON, RETICCTPCT in the last 72 hours.  Coagulation profile  Recent Labs Lab 05/30/15 1852 05/31/15 0545  INR 1.85* 1.94*    No results for input(s): DDIMER in the last 72 hours.  Cardiac Enzymes  Recent Labs Lab 05/31/15 0014 05/31/15 0545 05/31/15 1230  TROPONINI 0.09* 0.07* 0.07*   ------------------------------------------------------------------------------------------------------------------ Invalid input(s): POCBNP    Nihar Klus D.O. on 05/31/2015 at 2:08 PM  Between 7am to 7pm - Pager - [phone]  After 7pm go to [email or web address] - password [email or web address]  And look for the night coverage person covering for me after hours  [facility] Office  [phone]

## 2015-05-31 NOTE — Progress Notes (Signed)
Initial Nutrition Assessment  DOCUMENTATION CODES:   Not applicable  INTERVENTION:  - Continue Ensure Enlive po BID, each supplement provides 350 kcal and 20 grams of protein - Encourage intakes as nausea begins to resolve - RD will continue to monitor for needs with improvement in nausea  NUTRITION DIAGNOSIS:   Inadequate oral intake related to acute illness, nausea, vomiting as evidenced by per patient/family report.  GOAL:   Patient will meet greater than or equal to 90% of their needs  MONITOR:   PO intake, Supplement acceptance, Weight trends, Labs, Skin, I & O's  REASON FOR ASSESSMENT:   Malnutrition Screening Tool  ASSESSMENT:   82 y.o. female who presents to the ED with c/o weakness. She has had 4 day history of cough, nausea, decreased PO intake, generalized weakness. All of this onset late last week as a URI which then moved down into her chest. She saw her PCP yesterday who did a CXR (which is available on our Epic system) which demonstrated BLL PNA), labs were drawn this morning which showed dehydration with K of 2.5, elevated BUN. Patient was sent to the ED. Daughter gave patient Gatorade in the mean time.  Pt seen for MST. BMI indicates overweight status. Per chart review, pt ate 10% of breakfast this AM. Pt reports that she continues to feel nauseated but denies emesis this AM. Chart review indicates last episode of emesis was yesterday (3/7) at 2341. Pt states she began feeling nausea with associated weakness and decreased appetite on Friday (05/26/15). She states further decline since that time and that she went to PCP on Monday (3/6) and came to the hospital yesterday (3/7).   Pt requests that physical assessment not be done at this time due to nausea and discomfort. Will need to inquire about intakes and abilities prior to acute illness during follow-up visit. Per chart review, pt has lost 4 lbs (3% body weight) in the past 5 months which is not significant for  time frame. Unable to state malnutrition at this time but will update following physical assessment.  Not meeting needs. Ensure Enlive order already in place; will monitor for additional needs and adjustments. Medications reviewed. IVF: NS @ 125 mL/hr. Labs reviewed; K: 3.2 mmol/L, BUN/creatinine elevated but trending down, Ca: 8.1 mg/dL, GFR: 29.   Diet Order:  Diet Heart Room service appropriate?: Yes with Assist; Fluid consistency:: Thin  Skin:  Wound (see comment) (MSAD to bilateral buttocks)  Last BM:  3/7  Height:   Ht Readings from Last 1 Encounters:  05/30/15 5\' 4"  (1.626 m)    Weight:   Wt Readings from Last 1 Encounters:  05/30/15 146 lb 3.2 oz (66.316 kg)    Ideal Body Weight:  54.54 kg (kg)  BMI:  Body mass index is 25.08 kg/(m^2).  Estimated Nutritional Needs:   Kcal:  1350-1550  Protein:  55-65 grams  Fluid:  1.8-2 L/day  EDUCATION NEEDS:   No education needs identified at this time     Trenton GammonJessica Amillia Biffle, RD, LDN Inpatient Clinical Dietitian Pager # [phone] After hours/weekend pager # [phone]

## 2015-06-01 LAB — CBC
HCT: 35.1 % — ABNORMAL LOW (ref 36.0–46.0)
Hemoglobin: 11 g/dL — ABNORMAL LOW (ref 12.0–15.0)
MCH: 27.2 pg (ref 26.0–34.0)
MCHC: 31.3 g/dL (ref 30.0–36.0)
MCV: 86.9 fL (ref 78.0–100.0)
Platelets: 112 10*3/uL — ABNORMAL LOW (ref 150–400)
RBC: 4.04 MIL/uL (ref 3.87–5.11)
RDW: 16.7 % — ABNORMAL HIGH (ref 11.5–15.5)
WBC: 5.2 10*3/uL (ref 4.0–10.5)

## 2015-06-01 LAB — BASIC METABOLIC PANEL
Anion gap: 11 (ref 5–15)
BUN: 24 mg/dL — AB (ref 6–20)
CHLORIDE: 108 mmol/L (ref 101–111)
CO2: 24 mmol/L (ref 22–32)
CREATININE: 1.24 mg/dL — AB (ref 0.44–1.00)
Calcium: 8.2 mg/dL — ABNORMAL LOW (ref 8.9–10.3)
GFR calc Af Amer: 42 mL/min — ABNORMAL LOW (ref >60)
GFR calc non Af Amer: 36 mL/min — ABNORMAL LOW (ref >60)
Glucose, Bld: 104 mg/dL — ABNORMAL HIGH (ref 65–99)
Potassium: 3.7 mmol/L (ref 3.5–5.1)
Sodium: 143 mmol/L (ref 135–145)

## 2015-06-01 LAB — MAGNESIUM: Magnesium: 1.6 mg/dL — ABNORMAL LOW (ref 1.7–2.4)

## 2015-06-01 LAB — PROTIME-INR
INR: 3.91 — ABNORMAL HIGH (ref 0.00–1.49)
PROTHROMBIN TIME: 36.2 s — AB (ref 11.6–15.2)

## 2015-06-01 MED ORDER — ALBUTEROL SULFATE (2.5 MG/3ML) 0.083% IN NEBU
2.5000 mg | INHALATION_SOLUTION | Freq: Once | RESPIRATORY_TRACT | Status: AC
  Administered 2015-06-01: 2.5 mg via RESPIRATORY_TRACT
  Filled 2015-06-01: qty 3

## 2015-06-01 MED ORDER — DM-GUAIFENESIN ER 30-600 MG PO TB12
1.0000 | ORAL_TABLET | Freq: Two times a day (BID) | ORAL | Status: DC
  Administered 2015-06-01 – 2015-06-07 (×12): 1 via ORAL
  Filled 2015-06-01 (×13): qty 1

## 2015-06-01 MED ORDER — ALBUTEROL SULFATE (2.5 MG/3ML) 0.083% IN NEBU
2.5000 mg | INHALATION_SOLUTION | RESPIRATORY_TRACT | Status: DC | PRN
  Administered 2015-06-02 (×2): 2.5 mg via RESPIRATORY_TRACT
  Filled 2015-06-01 (×2): qty 3

## 2015-06-01 MED ORDER — MAGNESIUM SULFATE 2 GM/50ML IV SOLN
2.0000 g | Freq: Once | INTRAVENOUS | Status: AC
  Administered 2015-06-01: 2 g via INTRAVENOUS
  Filled 2015-06-01: qty 50

## 2015-06-01 NOTE — Progress Notes (Signed)
PT demonstrated verbal and hands on understanding of Flutter device. 

## 2015-06-01 NOTE — Clinical Social Work Placement (Signed)
Patient has a bed at [facility]. CSW has completed FL2 & will continue to follow and assist with discharge when ready.    Lincoln MaxinKelly Willian Donson, LCSW [facility] Clinical Social Worker cell #: [phone]     CLINICAL SOCIAL WORK PLACEMENT  NOTE  Date:  06/01/2015  Patient Details  Name: Tonya Farley MRN: [id number] Date of Birth: 08/06/1919  Clinical Social Work is seeking post-discharge placement for this patient at the Skilled  Nursing Facility level of care (*CSW will initial, date and re-position this form in  chart as items are completed):  Yes   Patient/family provided with [facility] Clinical Social Work Department's list of facilities offering this level of care within the geographic area requested by the patient (or if unable, by the patient's family).  Yes   Patient/family informed of their freedom to choose among providers that offer the needed level of care, that participate in Medicare, Medicaid or managed care program needed by the patient, have an available bed and are willing to accept the patient.  Yes   Patient/family informed of [facility]'s ownership interest in [facility] and [facility], as well as of the fact that they are under no obligation to receive care at these facilities.  PASRR submitted to EDS on 06/01/15     PASRR number received on 06/01/15     Existing PASRR number confirmed on 06/01/15     FL2 transmitted to all facilities in geographic area requested by pt/family on 06/01/15     FL2 transmitted to all facilities within larger geographic area on       Patient informed that his/her managed care company has contracts with or will negotiate with certain facilities, including the following:        Yes   Patient/family informed of bed offers received.  Patient chooses bed at [facility]     Physician recommends and patient chooses bed at      Patient to be transferred to [facility] on  .  Patient to be transferred to facility by [facility]     Patient family notified on   of transfer.  Name of family member notified:        PHYSICIAN       Additional Comment:    _______________________________________________ Arlyss RepressHarrison, Sakshi Sermons F, LCSW 06/01/2015, 3:15 PM

## 2015-06-01 NOTE — NC FL2 (Signed)
[place] MEDICAID FL2 LEVEL OF CARE SCREENING TOOL     IDENTIFICATION  Patient Name: Tonya Farley Birthdate: 04/30/1919 Sex: female Admission Date (Current Location): 05/30/2015  [facility] [place]   Facility and Address:  [facility],  [address], [place] [id number]      Provider Number: [id number]  Attending Physician Name and Address:  Edsel PetrinMaryann Mikhail, DO  Relative Name and Phone Number:       Current Level of Care: Hospital Recommended Level of Care: Skilled Nursing Facility Prior Approval Number:    Date Approved/Denied:   PASRR Number: [id number]  Discharge Plan: SNF    Current Diagnoses: Patient Active Problem List   Diagnosis Date Noted  . CAP (community acquired pneumonia) 05/30/2015  . Dehydration 05/30/2015  . Renal artery stenosis (HCC) 08/08/2014  . Encounter for therapeutic drug monitoring 04/21/2013  . Hypertension 06/25/2012  . Hyperlipidemia 06/25/2012  . Atrial fibrillation (HCC) 06/25/2012  . Compression fracture of lumbar spine, non-traumatic 06/25/2012  . Chronic back pain     Orientation RESPIRATION BLADDER Height & Weight     Self, Time, Situation, Place  Normal Continent Weight: 146 lb 3.2 oz (66.316 kg) Height:  5\' 4"  (162.6 cm)  BEHAVIORAL SYMPTOMS/MOOD NEUROLOGICAL BOWEL NUTRITION STATUS      Continent Diet (Heart Healthy)  AMBULATORY STATUS COMMUNICATION OF NEEDS Skin   Extensive Assist Verbally Normal                       Personal Care Assistance Level of Assistance  Bathing, Feeding, Dressing Bathing Assistance: Limited assistance Feeding assistance: Limited assistance Dressing Assistance: Limited assistance     Functional Limitations Info             SPECIAL CARE FACTORS FREQUENCY  PT (By licensed PT), OT (By licensed OT)     PT Frequency: 5 OT Frequency: 5            Contractures      Additional Factors Info  Code Status, Allergies, Isolation Precautions  Code Status Info: DNR Allergies Info: Penicillins, Sulfa Antibiotics     Isolation Precautions Info: Droplet Precautions - positive for Influenza A     Current Medications (06/01/2015):  This is the current hospital active medication list Current Facility-Administered Medications  Medication Dose Route Frequency Provider Last Rate Last Dose  . 0.9 %  sodium chloride infusion   Intravenous Continuous Hillary BowJared M Gardner, DO 125 mL/hr at 06/01/15 0747    . antiseptic oral rinse (CPC / CETYLPYRIDINIUM CHLORIDE 0.05%) solution 7 mL  7 mL Mouth Rinse BID Hillary BowJared M Gardner, DO   7 mL at 06/01/15 0946  . atorvastatin (LIPITOR) tablet 10 mg  10 mg Oral Daily Hillary BowJared M Gardner, DO   10 mg at 06/01/15 0943  . brimonidine (ALPHAGAN) 0.15 % ophthalmic solution 1 drop  1 drop Both Eyes TID Hillary BowJared M Gardner, DO   1 drop at 06/01/15 0944  . diltiazem (CARDIZEM CD) 24 hr capsule 360 mg  360 mg Oral Daily Hillary BowJared M Gardner, DO   360 mg at 06/01/15 0943  . feeding supplement (ENSURE ENLIVE) (ENSURE ENLIVE) liquid 237 mL  237 mL Oral BID BM Hillary BowJared M Gardner, DO   237 mL at 05/31/15 1400  . labetalol (NORMODYNE) tablet 200 mg  200 mg Oral BID Hillary BowJared M Gardner, DO   200 mg at 06/01/15 0943  . latanoprost (XALATAN) 0.005 % ophthalmic solution 1 drop  1 drop  Both Eyes QHS Hillary Bow, DO   1 drop at 05/31/15 2244  . levofloxacin (LEVAQUIN) IVPB 750 mg  750 mg Intravenous Q48H Maryann Mikhail, DO      . levothyroxine (SYNTHROID, LEVOTHROID) tablet 100 mcg  100 mcg Oral QAC breakfast Hillary Bow, DO   100 mcg at 06/01/15 0746  . ondansetron (ZOFRAN) injection 4 mg  4 mg Intravenous Q6H PRN Leanne Chang, NP   4 mg at 05/31/15 1033  . timolol (TIMOPTIC) 0.5 % ophthalmic solution 1 drop  1 drop Both Eyes q morning - 10a Hillary Bow, DO   1 drop at 06/01/15 0944  . Warfarin - Pharmacist Dosing Inpatient   Does not apply q1800 Donell Beers, RPH   0  at 05/31/15 1800     Discharge Medications: Please see  discharge summary for a list of discharge medications.  Relevant Imaging Results:  Relevant Lab Results:   Additional Information SSN: [id number]  Arlyss Repress, LCSW

## 2015-06-01 NOTE — Progress Notes (Signed)
ANTICOAGULATION CONSULT NOTE - follow up  Pharmacy Consult for Warfarin Indication: atrial fibrillation  Allergies  Allergen Reactions  . Penicillins Anaphylaxis, Hives and Swelling       . Sulfa Antibiotics Anaphylaxis and Swelling    Patient Measurements: Height: 5\' 4"  (162.6 cm) Weight: 146 lb 3.2 oz (66.316 kg) IBW/kg (Calculated) : 54.7  Vital Signs: Temp: 98.4 F (36.9 C) (03/09 0500) Temp Source: Oral (03/09 0500) BP: 137/55 mmHg (03/09 0500) Pulse Rate: 53 (03/09 0500)  Labs:  Recent Labs  05/30/15 1852 05/31/15 0014 05/31/15 0545 05/31/15 1230 06/01/15 0451  HGB 11.9*  --  11.4*  --  11.0*  HCT 37.0  --  35.9*  --  35.1*  PLT 120*  --  114*  --  112*  LABPROT 21.3*  --  22.0*  --  36.2*  INR 1.85*  --  1.94*  --  3.91*  CREATININE 1.82*  --  1.48*  --  1.24*  TROPONINI  --  0.09* 0.07* 0.07*  --     Estimated Creatinine Clearance: 26.5 mL/min (by C-G formula based on Cr of 1.24).  Assessment: [age] yoF admitted on 3/7 for pneumonia.  PMH includes HTN, hyperlipidemia, and AFib on chronic warfarin anticoagulation.  PTA Warfarin regimen = 3mg  daily except 2mg  MWF; last reported dose taken on 3/6 @ 21:00. INR upon admission = 1.85 (subtherapeutic). Pharmacy consulted to continue dosing of warfarin.  Today, 06/01/2015  INR 3.91, significantly increased to supratherapetic overnight, despite lower home dose and previously subtherapeutic INR.  CBC: Hgb and Plt remain low but stable  No reported bleeding or complications.  Diet: heart healthy, < 50% meal intake charted  Drug-drug interactions:  Levaquin (started 3/7) likely contributed to elevated INR.    Goal of Therapy:  INR 2-3   Plan:  HOLD warfarin dose today. Daily PT/INR. Monitor for signs and symptoms of bleeding.  Lynann Beaverhristine Milessa Hogan PharmD, BCPS Pager [phone] 06/01/2015 7:58 AM

## 2015-06-01 NOTE — Evaluation (Signed)
Physical Therapy Evaluation Patient Details Name: Tonya Farley MRN: [id number] DOB: 1919/05/15 Today's Date: 06/01/2015   History of Present Illness  82 yo female admitted with Pna. Hx of HTN, chronic back pain, A fib.   Clinical Impression  On eval, pt required Min assist for mobility. Pt performed a stand pivot and took a few steps in room with RW. Dyspnea 3/4. Pt fatigues easily with minimal activity. Recommend ST rehab at SNF unless pt will have appropriate level of assistance at home. Will follow and progress activity as tolerated.     Follow Up Recommendations SNF (unless pt will have 24 hour care at home)    Equipment Recommendations  None recommended by PT    Recommendations for Other Services       Precautions / Restrictions Precautions Precautions: Fall Restrictions Weight Bearing Restrictions: No      Mobility  Bed Mobility Overal bed mobility: Needs Assistance Bed Mobility: Supine to Sit     Supine to sit: Min assist;HOB elevated     General bed mobility comments: Assist to get to EOB. Increased time. Moderate reliance on bedrail.   Transfers Overall transfer level: Needs assistance Equipment used: Rolling walker (2 wheeled) Transfers: Sit to/from [employer] Sit to Stand: Min assist Stand pivot transfers: Min assist       General transfer comment: Assist to rise, stabilize, control descent. Stand pivot from bed to [facility] with RW. VCs safety, technique, hand placement.   Ambulation/Gait Ambulation/Gait assistance: Min assist Ambulation Distance (Feet): 4 Feet Assistive device: Rolling walker (2 wheeled) Gait Pattern/deviations: Step-to pattern;Decreased stride length;Trunk flexed     General Gait Details: Assist to stabilize pt and maneuver with RW. Increased time just to take a few steps. Dyspnea 3/4. Pt fatigues quickly. Remained on [place] O2.   Stairs            Wheelchair Mobility    Modified Rankin (Stroke Patients  Only)       Balance Overall balance assessment: Needs assistance         Standing balance support: Bilateral upper extremity supported;During functional activity Standing balance-Leahy Scale: Poor                               Pertinent Vitals/Pain Pain Assessment: No/denies pain    Home Living Family/patient expects to be discharged to:: Private residence Living Arrangements: Alone   Type of Home: House Home Access: Stairs to enter Entrance Stairs-Rails: Can reach both;Left;Right Entrance Stairs-Number of Steps: 3 Home Layout: One level Home Equipment: Walker - 2 wheels;Cane - single point Additional Comments: Pt still drives.     Prior Function Level of Independence: Independent with assistive device(s)         Comments: used cane     Hand Dominance        Extremity/Trunk Assessment   Upper Extremity Assessment: Generalized weakness           Lower Extremity Assessment: Generalized weakness      Cervical / Trunk Assessment: Kyphotic  Communication   Communication: HOH  Cognition Arousal/Alertness: Awake/alert Behavior During Therapy: WFL for tasks assessed/performed Overall Cognitive Status: Within Functional Limits for tasks assessed                      General Comments      Exercises        Assessment/Plan    PT Assessment Patient needs continued PT  services  PT Diagnosis Difficulty walking;Generalized weakness   PT Problem List Decreased strength;Decreased activity tolerance;Decreased balance;Decreased mobility;Decreased knowledge of use of DME  PT Treatment Interventions DME instruction;Gait training;Functional mobility training;Therapeutic activities;Patient/family education;Balance training;Therapeutic exercise   PT Goals (Current goals can be found in the Care Plan section) Acute Rehab PT Goals Patient Stated Goal: none stated PT Goal Formulation: With patient Time For Goal Achievement: 06/15/15 Potential  to Achieve Goals: Good    Frequency Min 3X/week   Barriers to discharge        Co-evaluation               End of Session   Activity Tolerance: Patient limited by fatigue Patient left: in chair;with call bell/phone within reach;with chair alarm set           Time: [id number] PT Time Calculation (min) (ACUTE ONLY): 24 min   Charges:   PT Evaluation $PT Eval Low Complexity: 1 Procedure PT Treatments $Therapeutic Activity: 8-22 mins   PT G Codes:        [name], MPT Pager: [phone]

## 2015-06-01 NOTE — Clinical Social Work Note (Signed)
Clinical Social Work Assessment  Patient Details  Name: Tonya MoatsMamie N Deleonardis MRN: [id number] Date of Birth: 10/29/1919  Date of referral:  06/01/15               Reason for consult:  Facility Placement                Permission sought to share information with:  [facility] Contact Representative Permission granted to share information::  Yes, Verbal Permission Granted  Name::        Agency::     Relationship::     Contact Information:     Housing/Transportation Living arrangements for the past 2 months:  Single Family Home Source of Information:  Patient, Adult Children Patient Interpreter Needed:  None Criminal Activity/Legal Involvement Pertinent to Current Situation/Hospitalization:  No - Comment as needed Significant Relationships:  Adult Children Lives with:  Self Do you feel safe going back to the place where you live?  No Need for family participation in patient care:  Yes (Comment)  Care giving concerns:  CSW reviewed PT evaluation recommending SNF at discharge.    Social Worker assessment / plan:  CSW spoke with patient & niece, Wille CelesteJanie at bedside re: discharge planning.   Employment status:  Retired Health and safety inspectornsurance information:  Medicare PT Recommendations:  Skilled Nursing Facility Information / Referral to community resources:  Skilled Nursing Facility  Patient/Family's Response to care:  Patient is agreeable with plan for SNF - requesting [facility] as she has been there in 2014. CS[facility] would be able to take patient when ready.   Patient/Family's Understanding of and Emotional Response to Diagnosis, Current Treatment, and Prognosis:    Emotional Assessment Appearance:  Appears stated age Attitude/Demeanor/Rapport:    Affect (typically observed):  Accepting Orientation:  Oriented to Self, Oriented to Place, Oriented to  Time, Oriented to Situation Alcohol / Substance use:    Psych involvement (Current and /or in the  community):     Discharge Needs  Concerns to be addressed:    Readmission within the last 30 days:    Current discharge risk:    Barriers to Discharge:      Arlyss RepressHarrison, Mindel Friscia F, LCSW 06/01/2015, 3:13 PM

## 2015-06-01 NOTE — Progress Notes (Signed)
[facility]                                                                              Patient Demographics  Tonya Farley, is a 82 y.o. female, DOB - [birth year], BMW:[id number]  Admit date - 05/30/2015   Admitting Physician Hillary Bow, DO  Outpatient Primary MD for the patient is ROBERTS, Vernie Ammons, MD  LOS - 2   Chief Complaint  Patient presents with  . Weakness      HPI on 05/30/2015 by Dr. Lyda Perone FRANKEE Tonya Farley is a 82 y.o. female who presents to the ED with c/o weakness. She has had 4 day history of cough, nausea, decreased PO intake, generalized weakness. All of this onset late last week as a URI which then moved down into her chest. She saw her PCP yesterday who did a CXR (which is available on our Epic system) which demonstrated BLL PNA), labs were drawn this morning which showed dehydration with K of 2.5, elevated BUN. Patient was sent to the ED. Daughter gave patient Gatorade in the mean time.  Assessment & Plan   Community acquired pneumonia/ Influenza A -Chest x-ray 05/29/2015 did show: Acute bilateral lower lobe pneumonia -Chest x-ray 05/30/2015 showed acute bronchitis -Influenza A positive -Continue Levaquin, patient has been insulin and sulfa allergies -Will not start Tamiflu at this time as patient's symptoms have been ongoing for more than 5 days  Acute on chronic kidney disease, stage III -Upon admission, creatinine 1.82.  -Currently 1.24 -Continue IV fluids -Monitor BMP  Hypokalemia -Replaced, Continue to  monitor BMP -magnesium 1.6, will replace  Atrial fibrillation -Continue Coumadin per pharmacy -Continue diltiazem, labetalol -CHADSVASC 4 (age, gender, HTN)  Mildly elevated troponin -Likely secondary to above -Currently denies chest pain -Peak troponin 0.11, currently trending downward 0.07  Code Status: DNR  Family Communication: None at bedside  Disposition Plan: Admitted. Discharge likely in 24-48  hours.  Time Spent in minutes   30 minutes  Procedures  None  Consults   None  DVT Prophylaxis  Coumadin  Lab Results  Component Value Date   PLT 112* 06/01/2015    Medications  Scheduled Meds: . antiseptic oral rinse  7 mL Mouth Rinse BID  . atorvastatin  10 mg Oral Daily  . brimonidine  1 drop Both Eyes TID  . diltiazem  360 mg Oral Daily  . feeding supplement (ENSURE ENLIVE)  237 mL Oral BID BM  . labetalol  200 mg Oral BID  . latanoprost  1 drop Both Eyes QHS  . levofloxacin (LEVAQUIN) IV  750 mg Intravenous Q48H  . levothyroxine  100 mcg Oral QAC breakfast  . timolol  1 drop Both Eyes q morning - 10a  . Warfarin - Pharmacist Dosing Inpatient   Does not apply q1800   Continuous Infusions: . sodium chloride 125 mL/hr at 06/01/15 0747   PRN Meds:.ondansetron (ZOFRAN) IV  Antibiotics    Anti-infectives    Start     Dose/Rate Route Frequency Ordered Stop   06/01/15 2200  levofloxacin (LEVAQUIN) IVPB 750 mg     750 mg 100 mL/hr over 90 Minutes Intravenous Every 48 hours 05/31/15 0747  05/30/15 2130  levofloxacin (LEVAQUIN) IVPB 750 mg  Status:  Discontinued     750 mg 100 mL/hr over 90 Minutes Intravenous Every 24 hours 05/30/15 2120 05/31/15 0747   05/30/15 2115  levofloxacin (LEVAQUIN) tablet 500 mg  Status:  Discontinued     500 mg Oral Daily 05/30/15 2103 05/30/15 2104   05/30/15 2115  cefTRIAXone (ROCEPHIN) 1 g in dextrose 5 % 50 mL IVPB  Status:  Discontinued     1 g 100 mL/hr over 30 Minutes Intravenous Every 24 hours 05/30/15 2108 05/30/15 2118   05/30/15 2115  azithromycin (ZITHROMAX) tablet 500 mg  Status:  Discontinued     500 mg Oral Every 24 hours 05/30/15 2108 05/30/15 2118       Subjective:   Vera Gaines seen and examined today.  Feels mildly better and that her breathing has improved.  Still has a dry cough and cannot cough anything up.  Denies current chest pain, abdominal pain, nausea or vomiting, diarrhea constipation, dizziness or  headache.    Objective:   Filed Vitals:   05/31/15 0547 05/31/15 1422 05/31/15 2036 06/01/15 0500  BP: 137/70 135/80 131/56 137/55  Pulse: 87 71 75 53  Temp: 97.9 F (36.6 C) 98.1 F (36.7 C) 98 F (36.7 C) 98.4 F (36.9 C)  TempSrc: Oral Oral Oral Oral  Resp: Height:      Weight:      SpO2: 99% 99% 98% 97%    Wt Readings from Last 3 Encounters:  05/30/15 66.316 kg (146 lb 3.2 oz)  01/10/15 68.04 kg (150 lb)  08/08/14 70.217 kg (154 lb 12.8 oz)     Intake/Output Summary (Last 24 hours) at 06/01/15 1258 Last data filed at 06/01/15 0758  Gross per 24 hour  Intake   3180 ml  Output    250 ml  Net   2930 ml    Exam  General: Well developed, well nourished, NAD  HEENT: NCAT,mucous membranes moist.   Cardiovascular: S1 S2 auscultated, IRR   Respiratory: Course breath sounds, occ cough  Abdomen: Soft, nontender, nondistended, + bowel sounds  Extremities: warm dry without cyanosis clubbing or edema  Neuro: AAOx3, nonfocal  Psych: Normal affect and demeanor, pleasant  Data Review   Micro Results No results found for this or any previous visit (from the past 240 hour(s)).  Radiology Reports Dg Chest 2 View  05/30/2015  CLINICAL DATA:  Pt here from home, she was seen at PCP and had a chest xray that showed pneumonia and she was told to come to ED. EXAM: CHEST  2 VIEW COMPARISON:  05/29/2015 FINDINGS: Lung base opacity is noted, less prominent than on the previous day's study. On the lateral view this appears to be streaky peribronchovascular opacity, seen to a lesser degree in the perihilar regions. This is consistent with bronchial inflammation. May be all chronic bronchitic change. A component acute bronchitis is possible. There is no convincing pneumonia. No pulmonary edema. No pleural effusion or pneumothorax. Cardiac silhouette is mildly enlarged. No mediastinal or hilar masses or convincing adenopathy. Bony thorax is demineralized. Vertebroplasty has  been performed in upper lumbar vertebrae, stable from the prior study. IMPRESSION: 1. No convincing pneumonia. 2. Findings described above may be due to chronic bronchitic change. Superimposed acute bronchitis is possible. Electronically Signed   By: Amie Portland M.D.   On: 05/30/2015 19:22   Dg Chest 2 View  05/29/2015  CLINICAL DATA:  82 year old presenting with acute  onset of cough and nausea. EXAM: CHEST  2 VIEW COMPARISON:  01/10/2015 and earlier. FINDINGS: Suboptimal inspiration accounts for crowded bronchovascular markings, especially in the bases, and accentuates the cardiac silhouette. Taking this into account, cardiac silhouette moderately enlarged, unchanged. Thoracic aorta tortuous atherosclerotic, unchanged. Hilar and mediastinal contours otherwise unremarkable. Bronchiectasis involving the lower lobes, right greater than left. Marked central peribronchial thickening, more so than on the prior examination. Airspace consolidation in both lower lobes, right greater than left. Possible small bilateral pleural effusions. Pulmonary vascularity normal. Exaggeration of the usual thoracic kyphosis in part related to old compression fractures of L1 with augmentation and of T12. IMPRESSION: Acute bilateral lower lobe pneumonia superimposed upon moderate to severe changes of acute bronchitis and/or asthma. Electronically Signed   By: Hulan Saashomas  Lawrence M.D.   On: 05/29/2015 16:51    CBC  Recent Labs Lab 05/30/15 1852 05/31/15 0545 06/01/15 0451  WBC 4.7 4.0 5.2  HGB 11.9* 11.4* 11.0*  HCT 37.0 35.9* 35.1*  PLT 120* 114* 112*  MCV 85.1 84.1 86.9  MCH 27.4 26.7 27.2  MCHC 32.2 31.8 31.3  RDW 16.6* 16.3* 16.7*  LYMPHSABS 0.8  --   --   MONOABS 0.7  --   --   EOSABS 0.0  --   --   BASOSABS 0.0  --   --     Chemistries   Recent Labs Lab 05/30/15 1852 05/31/15 0545 06/01/15 0451  NA 135 139 143  K 3.3* 3.2* 3.7  CL 98* 101 108  CO2 26 27 24   GLUCOSE 123* 108* 104*  BUN 38* 30* 24*   CREATININE 1.82* 1.48* 1.24*  CALCIUM 8.3* 8.1* 8.2*  MG  --   --  1.6*  AST 53*  --   --   ALT 33  --   --   ALKPHOS 54  --   --   BILITOT 0.8  --   --    ------------------------------------------------------------------------------------------------------------------ estimated creatinine clearance is 26.5 mL/min (by C-G formula based on Cr of 1.24). ------------------------------------------------------------------------------------------------------------------ No results for input(s): HGBA1C in the last 72 hours. ------------------------------------------------------------------------------------------------------------------ No results for input(s): CHOL, HDL, LDLCALC, TRIG, CHOLHDL, LDLDIRECT in the last 72 hours. ------------------------------------------------------------------------------------------------------------------ No results for input(s): TSH, T4TOTAL, T3FREE, THYROIDAB in the last 72 hours.  Invalid input(s): FREET3 ------------------------------------------------------------------------------------------------------------------ No results for input(s): VITAMINB12, FOLATE, FERRITIN, TIBC, IRON, RETICCTPCT in the last 72 hours.  Coagulation profile  Recent Labs Lab 05/30/15 1852 05/31/15 0545 06/01/15 0451  INR 1.85* 1.94* 3.91*    No results for input(s): DDIMER in the last 72 hours.  Cardiac Enzymes  Recent Labs Lab 05/31/15 0014 05/31/15 0545 05/31/15 1230  TROPONINI 0.09* 0.07* 0.07*   ------------------------------------------------------------------------------------------------------------------ Invalid input(s): POCBNP    Toby Ayad D.O. on 06/01/2015 at 12:58 PM  Between 7am to 7pm - Pager - [phone]  After 7pm go to [email or web address] - password [email or web address]  And look for the night coverage person covering for me after hours  [facility] Office  [phone]

## 2015-06-02 ENCOUNTER — Inpatient Hospital Stay (HOSPITAL_COMMUNITY): Payer: Medicare Other

## 2015-06-02 DIAGNOSIS — J111 Influenza due to unidentified influenza virus with other respiratory manifestations: Secondary | ICD-10-CM

## 2015-06-02 LAB — BASIC METABOLIC PANEL
Anion gap: 8 (ref 5–15)
BUN: 19 mg/dL (ref 6–20)
CALCIUM: 8.1 mg/dL — AB (ref 8.9–10.3)
CO2: 21 mmol/L — ABNORMAL LOW (ref 22–32)
CREATININE: 1.11 mg/dL — AB (ref 0.44–1.00)
Chloride: 108 mmol/L (ref 101–111)
GFR, EST AFRICAN AMERICAN: 48 mL/min — AB (ref >60)
GFR, EST NON AFRICAN AMERICAN: 41 mL/min — AB (ref >60)
Glucose, Bld: 120 mg/dL — ABNORMAL HIGH (ref 65–99)
Potassium: 4 mmol/L (ref 3.5–5.1)
SODIUM: 144 mmol/L (ref 135–145)

## 2015-06-02 LAB — CBC
HCT: 33.7 % — ABNORMAL LOW (ref 36.0–46.0)
HEMOGLOBIN: 11 g/dL — AB (ref 12.0–15.0)
MCH: 27.1 pg (ref 26.0–34.0)
MCHC: 32.6 g/dL (ref 30.0–36.0)
MCV: 83 fL (ref 78.0–100.0)
PLATELETS: 122 10*3/uL — AB (ref 150–400)
RBC: 4.06 MIL/uL (ref 3.87–5.11)
RDW: 16.8 % — ABNORMAL HIGH (ref 11.5–15.5)
WBC: 6.6 10*3/uL (ref 4.0–10.5)

## 2015-06-02 LAB — MAGNESIUM: MAGNESIUM: 2.5 mg/dL — AB (ref 1.7–2.4)

## 2015-06-02 LAB — PROTIME-INR
INR: 4.96 — AB (ref 0.00–1.49)
PROTHROMBIN TIME: 43.4 s — AB (ref 11.6–15.2)

## 2015-06-02 NOTE — Progress Notes (Signed)
ANTICOAGULATION CONSULT NOTE - follow up  Pharmacy Consult for Warfarin Indication: atrial fibrillation  Allergies  Allergen Reactions  . Penicillins Anaphylaxis, Hives and Swelling       . Sulfa Antibiotics Anaphylaxis and Swelling    Patient Measurements: Height: 5\' 4"  (162.6 cm) Weight: 146 lb 3.2 oz (66.316 kg) IBW/kg (Calculated) : 54.7  Vital Signs: Temp: 97.5 F (36.4 C) (03/10 0451) Temp Source: Oral (03/10 0451) BP: 149/69 mmHg (03/10 0451) Pulse Rate: 90 (03/10 0451)  Labs:  Recent Labs  05/31/15 0014 05/31/15 0545 05/31/15 1230 06/01/15 0451 06/02/15 0432  HGB  --  11.4*  --  11.0* 11.0*  HCT  --  35.9*  --  35.1* 33.7*  PLT  --  114*  --  112* 122*  LABPROT  --  22.0*  --  36.2* 43.4*  INR  --  1.94*  --  3.91* 4.96*  CREATININE  --  1.48*  --  1.24* 1.11*  TROPONINI 0.09* 0.07* 0.07*  --   --     Estimated Creatinine Clearance: 29.6 mL/min (by C-G formula based on Cr of 1.11).  Assessment: [age] yoF admitted on 3/7 for pneumonia.  PMH includes HTN, hyperlipidemia, and AFib on chronic warfarin anticoagulation.  PTA Warfarin regimen = 3mg  daily except 2mg  MWF; last reported dose taken on 3/6 @ 21:00. INR upon admission = 1.85 (subtherapeutic). Pharmacy consulted to continue dosing of warfarin.  Today, 06/02/2015  INR 4.96, supratherapetic and significantly increased overnight, despite holding warfarin dose.  CBC: Hgb and Plt remain low but stable  No reported bleeding or complications.  Diet: heart healthy, < 50% meal intake charted  Drug-drug interactions:  Levaquin (started 3/7) likely contributed to elevated INR.    Goal of Therapy:  INR 2-3   Plan:  HOLD warfarin dose today. Daily PT/INR. Monitor for signs and symptoms of bleeding.  Lynann Beaverhristine Laiana Fratus PharmD, BCPS Pager [phone] 06/02/2015 7:45 AM

## 2015-06-02 NOTE — Progress Notes (Signed)
Corrected Na level from 137 to 144.  Tammy from lab calling.  Drawn today at [phone] 06/02/15.  Sundra AlandMaura S Sentoria Brent, RN

## 2015-06-02 NOTE — Progress Notes (Signed)
Physical Therapy Treatment Patient Details Name: Tonya Farley MRN: [id number] DOB: 06/24/1919 Today's Date: 06/02/2015    History of Present Illness 82 yo female admitted with Pna. Hx of HTN, chronic back pain, A fib.     PT Comments    Pt agreeable to bed exercises on today. Reported fatigue after performing 15 reps of each. Remained on [place] O2.   Follow Up Recommendations  SNF     Equipment Recommendations  None recommended by PT    Recommendations for Other Services       Precautions / Restrictions Precautions Precautions: Fall Restrictions Weight Bearing Restrictions: No    Mobility  Bed Mobility                  Transfers                    Ambulation/Gait                 Stairs            Wheelchair Mobility    Modified Rankin (Stroke Patients Only)       Balance                                    Cognition Arousal/Alertness: Awake/alert Behavior During Therapy: WFL for tasks assessed/performed Overall Cognitive Status: Within Functional Limits for tasks assessed                      Exercises General Exercises - Lower Extremity Ankle Circles/Pumps: AROM;Both;15 reps;Supine Quad Sets: AROM;Both;15 reps;Supine Gluteal Sets: AROM;15 reps;Supine Heel Slides: Both;15 reps;AROM;Supine Hip ABduction/ADduction: AROM;Both;15 reps;Supine    General Comments        Pertinent Vitals/Pain Pain Assessment: No/denies pain    Home Living                      Prior Function            PT Goals (current goals can now be found in the care plan section) Progress towards PT goals: Progressing toward goals    Frequency  Min 3X/week    PT Plan Current plan remains appropriate    Co-evaluation             End of Session   Activity Tolerance: Patient tolerated treatment well Patient left: in bed;with call bell/phone within reach;with bed alarm set     Time: 1451-1502 PT Time  Calculation (min) (ACUTE ONLY): 11 min  Charges:  $Therapeutic Exercise: 8-22 mins                    G Codes:      Rebeca AlertJannie Verdene Creson, MPT Pager: [phone]

## 2015-06-02 NOTE — Care Management Important Message (Addendum)
Important Message  Patient Details IM Letter given to [name]/Case Manager to present to Patient Name: Tonya MoatsMamie N Lesperance MRN: [id number] Date of Birth: 09/26/1919   Medicare Important Message Given:  Yes    [name], Charise Leinbach 06/02/2015, 2:05 PMImportant Message  Patient Details  Name: Tonya MoatsMamie N Bissette MRN: [id number] Date of Birth: 07/06/1919   Medicare Important Message Given:  Yes    [name], Oisin Yoakum 06/02/2015, 2:05 PM

## 2015-06-02 NOTE — Progress Notes (Signed)
[facility]                                                                              Patient Demographics  Tonya Farley, is a 82 y.o. female, DOB - 1919/07/04, WUJ:[id number]  Admit date - 05/30/2015   Admitting Physician Hillary Bow, DO  Outpatient Primary MD for the patient is ROBERTS, Vernie Ammons, MD  LOS - 3   Chief Complaint  Patient presents with  . Weakness      HPI on 05/30/2015 by Dr. Lyda Perone Tonya Farley is a 82 y.o. female who presents to the ED with c/o weakness. She has had 4 day history of cough, nausea, decreased PO intake, generalized weakness. All of this onset late last week as a URI which then moved down into her chest. She saw her PCP yesterday who did a CXR (which is available on our Epic system) which demonstrated BLL PNA), labs were drawn this morning which showed dehydration with K of 2.5, elevated BUN. Patient was sent to the ED. Daughter gave patient Gatorade in the mean time.  Assessment & Plan   Community acquired pneumonia/ Influenza A -Chest x-ray 05/29/2015 did show: Acute bilateral lower lobe pneumonia -Chest x-ray 05/30/2015 showed acute bronchitis -Influenza A positive -Continue Levaquin, patient has been insulin and sulfa allergies -Will not start Tamiflu at this time as patient's symptoms have been ongoing for more than 5 days -CXR 06/02/2015: Bibasilar infiltrates left greater than right, associated with effusions. -Will obtain echocardiogram  Acute on chronic kidney disease, stage III -Upon admission, creatinine 1.82.  -Currently 1.24 -Continue IV fluids -Monitor BMP  Hypokalemia -Resovled, Continue to  monitor BMP -Magnesium 2.5   Atrial fibrillation -Continue Coumadin per pharmacy -Continue diltiazem, labetalol -CHADSVASC 4 (age, gender, HTN)  Mildly elevated troponin -Likely secondary to above -Currently denies chest pain -Peak troponin 0.11, currently trending downward 0.07  Physical  deconditioning -Like due to pna/flu -PT consulted and recommended SNF  Code Status: DNR  Family Communication: None at bedside  Disposition Plan: Admitted. Discharge likely in 24-48 hours. Will obtain echocardiogram  Time Spent in minutes   30 minutes  Procedures  None  Consults   None  DVT Prophylaxis  Coumadin  Lab Results  Component Value Date   PLT 122* 06/02/2015    Medications  Scheduled Meds: . antiseptic oral rinse  7 mL Mouth Rinse BID  . atorvastatin  10 mg Oral Daily  . brimonidine  1 drop Both Eyes TID  . dextromethorphan-guaiFENesin  1 tablet Oral BID  . diltiazem  360 mg Oral Daily  . feeding supplement (ENSURE ENLIVE)  237 mL Oral BID BM  . labetalol  200 mg Oral BID  . latanoprost  1 drop Both Eyes QHS  . levofloxacin (LEVAQUIN) IV  750 mg Intravenous Q48H  . levothyroxine  100 mcg Oral QAC breakfast  . timolol  1 drop Both Eyes q morning - 10a  . Warfarin - Pharmacist Dosing Inpatient   Does not apply q1800   Continuous Infusions:   PRN Meds:.albuterol, ondansetron (ZOFRAN) IV  Antibiotics    Anti-infectives    Start     Dose/Rate Route Frequency Ordered Stop  06/01/15 2200  levofloxacin (LEVAQUIN) IVPB 750 mg     750 mg 100 mL/hr over 90 Minutes Intravenous Every 48 hours 05/31/15 0747     05/30/15 2130  levofloxacin (LEVAQUIN) IVPB 750 mg  Status:  Discontinued     750 mg 100 mL/hr over 90 Minutes Intravenous Every 24 hours 05/30/15 2120 05/31/15 0747   05/30/15 2115  levofloxacin (LEVAQUIN) tablet 500 mg  Status:  Discontinued     500 mg Oral Daily 05/30/15 2103 05/30/15 2104   05/30/15 2115  cefTRIAXone (ROCEPHIN) 1 g in dextrose 5 % 50 mL IVPB  Status:  Discontinued     1 g 100 mL/hr over 30 Minutes Intravenous Every 24 hours 05/30/15 2108 05/30/15 2118   05/30/15 2115  azithromycin (ZITHROMAX) tablet 500 mg  Status:  Discontinued     500 mg Oral Every 24 hours 05/30/15 2108 05/30/15 2118       Subjective:   Talea Loll  seen and examined today.  Feels mildly better and that her breathing has improved but continues to feel congested with dry cough.  Denies current chest pain, abdominal pain, nausea or vomiting, diarrhea constipation, dizziness or headache.    Objective:   Filed Vitals:   06/01/15 2023 06/01/15 2107 06/02/15 0319 06/02/15 0451  BP: 176/64  138/51 149/69  Pulse: 77  62 90  Temp:    97.5 F (36.4 C)  TempSrc:    Oral  Resp: 22  20 22   Height:      Weight:      SpO2: 98% 99%  99%    Wt Readings from Last 3 Encounters:  05/30/15 66.316 kg (146 lb 3.2 oz)  01/10/15 68.04 kg (150 lb)  08/08/14 70.217 kg (154 lb 12.8 oz)     Intake/Output Summary (Last 24 hours) at 06/02/15 1258 Last data filed at 06/02/15 0941  Gross per 24 hour  Intake   1950 ml  Output    550 ml  Net   1400 ml    Exam  General: Well developed, well nourished, NAD  HEENT: NCAT,mucous membranes moist.   Cardiovascular: S1 S2 auscultated, IRR   Respiratory: Course breath sounds,diminished at the bases, occ cough  Abdomen: Soft, nontender, nondistended, + bowel sounds  Extremities: warm dry without cyanosis clubbing or edema  Neuro: AAOx3, nonfocal  Psych: Normal affect and demeanor, pleasant  Data Review   Micro Results Recent Results (from the past 240 hour(s))  Culture, blood (routine x 2) Call MD if unable to obtain prior to antibiotics being given     Status: None (Preliminary result)   Collection Time: 05/31/15 12:14 AM  Result Value Ref Range Status   Specimen Description BLOOD LEFT ANTECUBITAL  Final   Special Requests BOTTLES DRAWN AEROBIC ONLY 5CC  Final   Culture   Final    NO GROWTH 2 DAYS Performed at [facility]    Report Status PENDING  Incomplete  Culture, blood (routine x 2) Call MD if unable to obtain prior to antibiotics being given     Status: None (Preliminary result)   Collection Time: 05/31/15 12:14 AM  Result Value Ref Range Status   Specimen Description BLOOD  LEFT HAND  Final   Special Requests BOTTLES DRAWN AEROBIC ONLY 5CC  Final   Culture   Final    NO GROWTH 2 DAYS Performed at [facility]    Report Status PENDING  Incomplete    Radiology Reports Dg Chest 2 View  06/02/2015  CLINICAL DATA:  Chest congestion and shortness of Breath EXAM: CHEST  2 VIEW COMPARISON:  05/30/2015 FINDINGS: Cardiac shadow is stable. Persistent left basilar infiltrate is noted. Some increasing right basilar infiltrate is seen as well. Bilateral pleural effusions are noted. Changes of prior vertebral augmentation are seen. IMPRESSION: Bibasilar infiltrates left greater than right with associated effusions. Electronically Signed   By: Alcide CleverMark  Lukens M.D.   On: 06/02/2015 08:57   Dg Chest 2 View  05/30/2015  CLINICAL DATA:  Pt here from home, she was seen at PCP and had a chest xray that showed pneumonia and she was told to come to ED. EXAM: CHEST  2 VIEW COMPARISON:  05/29/2015 FINDINGS: Lung base opacity is noted, less prominent than on the previous day's study. On the lateral view this appears to be streaky peribronchovascular opacity, seen to a lesser degree in the perihilar regions. This is consistent with bronchial inflammation. May be all chronic bronchitic change. A component acute bronchitis is possible. There is no convincing pneumonia. No pulmonary edema. No pleural effusion or pneumothorax. Cardiac silhouette is mildly enlarged. No mediastinal or hilar masses or convincing adenopathy. Bony thorax is demineralized. Vertebroplasty has been performed in upper lumbar vertebrae, stable from the prior study. IMPRESSION: 1. No convincing pneumonia. 2. Findings described above may be due to chronic bronchitic change. Superimposed acute bronchitis is possible. Electronically Signed   By: Amie Portlandavid  Ormond M.D.   On: 05/30/2015 19:22   Dg Chest 2 View  05/29/2015  CLINICAL DATA:  82 year old presenting with acute onset of cough and nausea. EXAM: CHEST  2 VIEW COMPARISON:   01/10/2015 and earlier. FINDINGS: Suboptimal inspiration accounts for crowded bronchovascular markings, especially in the bases, and accentuates the cardiac silhouette. Taking this into account, cardiac silhouette moderately enlarged, unchanged. Thoracic aorta tortuous atherosclerotic, unchanged. Hilar and mediastinal contours otherwise unremarkable. Bronchiectasis involving the lower lobes, right greater than left. Marked central peribronchial thickening, more so than on the prior examination. Airspace consolidation in both lower lobes, right greater than left. Possible small bilateral pleural effusions. Pulmonary vascularity normal. Exaggeration of the usual thoracic kyphosis in part related to old compression fractures of L1 with augmentation and of T12. IMPRESSION: Acute bilateral lower lobe pneumonia superimposed upon moderate to severe changes of acute bronchitis and/or asthma. Electronically Signed   By: Hulan Saashomas  Lawrence M.D.   On: 05/29/2015 16:51    CBC  Recent Labs Lab 05/30/15 1852 05/31/15 0545 06/01/15 0451 06/02/15 0432  WBC 4.7 4.0 5.2 6.6  HGB 11.9* 11.4* 11.0* 11.0*  HCT 37.0 35.9* 35.1* 33.7*  PLT 120* 114* 112* 122*  MCV 85.1 84.1 86.9 83.0  MCH 27.4 26.7 27.2 27.1  MCHC 32.2 31.8 31.3 32.6  RDW 16.6* 16.3* 16.7* 16.8*  LYMPHSABS 0.8  --   --   --   MONOABS 0.7  --   --   --   EOSABS 0.0  --   --   --   BASOSABS 0.0  --   --   --     Chemistries   Recent Labs Lab 05/30/15 1852 05/31/15 0545 06/01/15 0451 06/02/15 0432  NA 135 139 143 144  K 3.3* 3.2* 3.7 4.0  CL 98* 101 108 108  CO2 26 27 24  21*  GLUCOSE 123* 108* 104* 120*  BUN 38* 30* 24* 19  CREATININE 1.82* 1.48* 1.24* 1.11*  CALCIUM 8.3* 8.1* 8.2* 8.1*  MG  --   --  1.6* 2.5*  AST 53*  --   --   --  ALT 33  --   --   --   ALKPHOS 54  --   --   --   BILITOT 0.8  --   --   --     ------------------------------------------------------------------------------------------------------------------ estimated creatinine clearance is 29.6 mL/min (by C-G formula based on Cr of 1.11). ------------------------------------------------------------------------------------------------------------------ No results for input(s): HGBA1C in the last 72 hours. ------------------------------------------------------------------------------------------------------------------ No results for input(s): CHOL, HDL, LDLCALC, TRIG, CHOLHDL, LDLDIRECT in the last 72 hours. ------------------------------------------------------------------------------------------------------------------ No results for input(s): TSH, T4TOTAL, T3FREE, THYROIDAB in the last 72 hours.  Invalid input(s): FREET3 ------------------------------------------------------------------------------------------------------------------ No results for input(s): VITAMINB12, FOLATE, FERRITIN, TIBC, IRON, RETICCTPCT in the last 72 hours.  Coagulation profile  Recent Labs Lab 05/30/15 1852 05/31/15 0545 06/01/15 0451 06/02/15 0432  INR 1.85* 1.94* 3.91* 4.96*    No results for input(s): DDIMER in the last 72 hours.  Cardiac Enzymes  Recent Labs Lab 05/31/15 0014 05/31/15 0545 05/31/15 1230  TROPONINI 0.09* 0.07* 0.07*   ------------------------------------------------------------------------------------------------------------------ Invalid input(s): POCBNP    Dani Danis D.O. on 06/02/2015 at 12:58 PM  Between 7am to 7pm - Pager - [phone]  After 7pm go to [email or web address] - password [email or web address]  And look for the night coverage person covering for me after hours  [facility] Office  [phone]

## 2015-06-02 NOTE — Progress Notes (Signed)
CSW continuing to follow.  Plans are for pt to go to [facility] when medically ready for discharge.  Per MD, pt not yet medically ready for discharge today.   CSW updated [facility] who confirmed that facility can accept pt over the weekend.   CSW updated pt niece, Darel HongJudy via telephone.  CSW to continue to follow to provide support and assist with pt discharge planning needs.   Loletta SpecterSuzanna Altariq Goodall, MSW, LCSW Clinical Social Work Coverage for [employer] EnergyKelly Harrison, [place]  [phone]

## 2015-06-02 NOTE — Progress Notes (Signed)
Patient was noted with SOB and congestion at 2023. NP on call made aware and order for albuterol given x 1. Respiratory therapy made aware.

## 2015-06-03 ENCOUNTER — Other Ambulatory Visit (HOSPITAL_COMMUNITY): Payer: Medicare Other

## 2015-06-03 DIAGNOSIS — J90 Pleural effusion, not elsewhere classified: Secondary | ICD-10-CM | POA: Diagnosis present

## 2015-06-03 DIAGNOSIS — R0600 Dyspnea, unspecified: Secondary | ICD-10-CM | POA: Diagnosis present

## 2015-06-03 DIAGNOSIS — R0989 Other specified symptoms and signs involving the circulatory and respiratory systems: Secondary | ICD-10-CM

## 2015-06-03 DIAGNOSIS — N179 Acute kidney failure, unspecified: Secondary | ICD-10-CM | POA: Diagnosis present

## 2015-06-03 LAB — CBC
HCT: 32.8 % — ABNORMAL LOW (ref 36.0–46.0)
HEMOGLOBIN: 10.9 g/dL — AB (ref 12.0–15.0)
MCH: 27.5 pg (ref 26.0–34.0)
MCHC: 33.2 g/dL (ref 30.0–36.0)
MCV: 82.6 fL (ref 78.0–100.0)
PLATELETS: 125 10*3/uL — AB (ref 150–400)
RBC: 3.97 MIL/uL (ref 3.87–5.11)
RDW: 16.8 % — ABNORMAL HIGH (ref 11.5–15.5)
WBC: 8 10*3/uL (ref 4.0–10.5)

## 2015-06-03 LAB — BASIC METABOLIC PANEL
Anion gap: 8 (ref 5–15)
BUN: 18 mg/dL (ref 6–20)
CO2: 25 mmol/L (ref 22–32)
CREATININE: 1.14 mg/dL — AB (ref 0.44–1.00)
Calcium: 8.8 mg/dL — ABNORMAL LOW (ref 8.9–10.3)
Chloride: 108 mmol/L (ref 101–111)
GFR, EST AFRICAN AMERICAN: 47 mL/min — AB (ref >60)
GFR, EST NON AFRICAN AMERICAN: 40 mL/min — AB (ref >60)
Glucose, Bld: 124 mg/dL — ABNORMAL HIGH (ref 65–99)
POTASSIUM: 4.1 mmol/L (ref 3.5–5.1)
SODIUM: 141 mmol/L (ref 135–145)

## 2015-06-03 LAB — PROTIME-INR
INR: 4.78 — AB (ref 0.00–1.49)
PROTHROMBIN TIME: 42.2 s — AB (ref 11.6–15.2)

## 2015-06-03 LAB — BRAIN NATRIURETIC PEPTIDE: B NATRIURETIC PEPTIDE 5: 358.1 pg/mL — AB (ref 0.0–100.0)

## 2015-06-03 MED ORDER — FUROSEMIDE 10 MG/ML IJ SOLN
40.0000 mg | Freq: Once | INTRAMUSCULAR | Status: AC
  Administered 2015-06-03: 40 mg via INTRAVENOUS
  Filled 2015-06-03: qty 4

## 2015-06-03 MED ORDER — SODIUM CHLORIDE 3 % IN NEBU
5.0000 mL | INHALATION_SOLUTION | Freq: Once | RESPIRATORY_TRACT | Status: AC
  Administered 2015-06-03: 5 mL via RESPIRATORY_TRACT
  Filled 2015-06-03: qty 8

## 2015-06-03 MED ORDER — ALBUTEROL SULFATE (2.5 MG/3ML) 0.083% IN NEBU
2.5000 mg | INHALATION_SOLUTION | Freq: Three times a day (TID) | RESPIRATORY_TRACT | Status: DC
  Administered 2015-06-03 – 2015-06-12 (×27): 2.5 mg via RESPIRATORY_TRACT
  Filled 2015-06-03 (×28): qty 3

## 2015-06-03 NOTE — Progress Notes (Signed)
ANTICOAGULATION CONSULT NOTE - follow up  Pharmacy Consult for Warfarin Indication: atrial fibrillation  Allergies  Allergen Reactions  . Penicillins Anaphylaxis, Hives and Swelling       . Sulfa Antibiotics Anaphylaxis and Swelling    Patient Measurements: Height: 5\' 4"  (162.6 cm) Weight: 146 lb 3.2 oz (66.316 kg) IBW/kg (Calculated) : 54.7  Vital Signs: Temp: 97.2 F (36.2 C) (03/11 0455) Temp Source: Oral (03/11 0455) BP: 153/62 mmHg (03/11 0455) Pulse Rate: 103 (03/11 0455)  Labs:  Recent Labs  06/01/15 0451 06/02/15 0432 06/03/15 0558  HGB 11.0* 11.0* 10.9*  HCT 35.1* 33.7* 32.8*  PLT 112* 122* 125*  LABPROT 36.2* 43.4* 42.2*  INR 3.91* 4.96* 4.78*  CREATININE 1.24* 1.11* 1.14*    Estimated Creatinine Clearance: 28.9 mL/min (by C-G formula based on Cr of 1.14).  Assessment: [age] yoF admitted on 3/7 for pneumonia.  PMH includes HTN, hyperlipidemia, and AFib on chronic warfarin anticoagulation.  PTA Warfarin regimen = 3mg  daily except 2mg  MWF; last reported dose taken on 3/6 @ 21:00. INR upon admission = 1.85 (subtherapeutic). Pharmacy consulted to continue dosing of warfarin.  Today, 06/03/2015  INR 4.78, supratherapeutic-last dose of warfarin 3/8  CBC: Hgb and Plt remain low but stable  No reported bleeding or complications.  Diet: heart healthy, < 50% meal intake charted  Drug-drug interactions:  Levaquin (started 3/7) likely contributed to elevated INR.    Goal of Therapy:  INR 2-3   Plan:  HOLD warfarin dose today. Daily PT/INR. Monitor for signs and symptoms of bleeding.  Arley Phenixllen Armstead Heiland RPh 06/03/2015, 12:54 PM Pager [phone]

## 2015-06-03 NOTE — Progress Notes (Signed)
[facility]                                                                              Patient Demographics  Tonya Farley, is a 82 y.o. female, DOB - Jan 19, 1920, GNF:[id number]  Admit date - 05/30/2015   Admitting Physician Hillary Bow, DO  Outpatient Primary MD for the patient is ROBERTS, Vernie Ammons, MD  LOS - 4   Chief Complaint  Patient presents with  . Weakness      HPI on 05/30/2015 by Dr. Lyda Perone Tonya Farley is a 82 y.o. female who presents to the ED with c/o weakness. She has had 4 day history of cough, nausea, decreased PO intake, generalized weakness. All of this onset late last week as a URI which then moved down into her chest. She saw her PCP yesterday who did a CXR (which is available on our Epic system) which demonstrated BLL PNA), labs were drawn this morning which showed dehydration with K of 2.5, elevated BUN. Patient was sent to the ED. Daughter gave patient Gatorade in the mean time.  Assessment & Plan   Community acquired pneumonia/ Influenza A -Chest x-ray 05/29/2015 did show: Acute bilateral lower lobe pneumonia -Chest x-ray 05/30/2015 showed acute bronchitis -Influenza A positive -Continue Levaquin, patient has been insulin and sulfa allergies -Will not start Tamiflu at this time as patient's symptoms have been ongoing for more than 5 days -CXR 06/02/2015: Bibasilar infiltrates left greater than right, associated with effusions. -Pending echocardiogram (patient did receive IVF upon admission) -Spoke with respiratory therapy, will try hypertonic neb  Acute on chronic kidney disease, stage III -Upon admission, creatinine 1.82.  -Currently 1.1 -Monitor BMP  Hypokalemia -Resovled, Continue to  monitor BMP -Magnesium 2.5   Atrial fibrillation -Continue Coumadin per pharmacy -Continue diltiazem, labetalol -CHADSVASC 4 (age, gender, HTN)  Mildly elevated troponin -Likely secondary to above -Currently denies chest pain -Peak  troponin 0.11, currently trending downward 0.07  Physical deconditioning -Like due to pna/flu -PT consulted and recommended SNF  Code Status: DNR  Family Communication: None at bedside  Disposition Plan: Admitted. Pending improvement in respiratory status and echocardiogram.   Time Spent in minutes   30 minutes  Procedures  None  Consults   None  DVT Prophylaxis  Coumadin  Lab Results  Component Value Date   PLT 125* 06/03/2015    Medications  Scheduled Meds: . albuterol  2.5 mg Nebulization TID  . antiseptic oral rinse  7 mL Mouth Rinse BID  . atorvastatin  10 mg Oral Daily  . brimonidine  1 drop Both Eyes TID  . dextromethorphan-guaiFENesin  1 tablet Oral BID  . diltiazem  360 mg Oral Daily  . feeding supplement (ENSURE ENLIVE)  237 mL Oral BID BM  . labetalol  200 mg Oral BID  . latanoprost  1 drop Both Eyes QHS  . levofloxacin (LEVAQUIN) IV  750 mg Intravenous Q48H  . levothyroxine  100 mcg Oral QAC breakfast  . timolol  1 drop Both Eyes q morning - 10a  . Warfarin - Pharmacist Dosing Inpatient   Does not apply q1800   Continuous Infusions:   PRN Meds:.albuterol, ondansetron (ZOFRAN) IV  Antibiotics    Anti-infectives    Start     Dose/Rate Route Frequency Ordered Stop   06/01/15 2200  levofloxacin (LEVAQUIN) IVPB 750 mg     750 mg 100 mL/hr over 90 Minutes Intravenous Every 48 hours 05/31/15 0747     05/30/15 2130  levofloxacin (LEVAQUIN) IVPB 750 mg  Status:  Discontinued     750 mg 100 mL/hr over 90 Minutes Intravenous Every 24 hours 05/30/15 2120 05/31/15 0747   05/30/15 2115  levofloxacin (LEVAQUIN) tablet 500 mg  Status:  Discontinued     500 mg Oral Daily 05/30/15 2103 05/30/15 2104   05/30/15 2115  cefTRIAXone (ROCEPHIN) 1 g in dextrose 5 % 50 mL IVPB  Status:  Discontinued     1 g 100 mL/hr over 30 Minutes Intravenous Every 24 hours 05/30/15 2108 05/30/15 2118   05/30/15 2115  azithromycin (ZITHROMAX) tablet 500 mg  Status:  Discontinued       500 mg Oral Every 24 hours 05/30/15 2108 05/30/15 2118       Subjective:   Cherry Fleischhacker seen and examined today.  Still unable to cough anything up.  Feels her breathing is about the same.  Denies chest pain, abdominal pain, nausea or vomiting, diarrhea constipation, dizziness or headache.    Objective:   Filed Vitals:   06/02/15 2300 06/03/15 0013 06/03/15 0455 06/03/15 0816  BP:  148/80 153/62   Pulse:  67 103   Temp:   97.2 F (36.2 C)   TempSrc:   Oral   Resp:  20 20   Height:      Weight:      SpO2: 97%  100% 95%    Wt Readings from Last 3 Encounters:  05/30/15 66.316 kg (146 lb 3.2 oz)  01/10/15 68.04 kg (150 lb)  08/08/14 70.217 kg (154 lb 12.8 oz)     Intake/Output Summary (Last 24 hours) at 06/03/15 1227 Last data filed at 06/03/15 0845  Gross per 24 hour  Intake    720 ml  Output      0 ml  Net    720 ml    Exam  General: Well developed, well nourished, NAD  HEENT: NCAT,mucous membranes moist.   Cardiovascular: S1 S2 auscultated, IRR   Respiratory: Course breath sounds, +rhonchi, expiratory wheezing. Upper airway congestion  Abdomen: Soft, nontender, nondistended, + bowel sounds  Extremities: warm dry without cyanosis clubbing or edema  Neuro: AAOx3, nonfocal  Psych: Normal affect and demeanor, pleasant  Data Review   Micro Results Recent Results (from the past 240 hour(s))  Culture, blood (routine x 2) Call MD if unable to obtain prior to antibiotics being given     Status: None (Preliminary result)   Collection Time: 05/31/15 12:14 AM  Result Value Ref Range Status   Specimen Description BLOOD LEFT ANTECUBITAL  Final   Special Requests BOTTLES DRAWN AEROBIC ONLY 5CC  Final   Culture   Final    NO GROWTH 2 DAYS Performed at [facility]    Report Status PENDING  Incomplete  Culture, blood (routine x 2) Call MD if unable to obtain prior to antibiotics being given     Status: None (Preliminary result)   Collection Time:  05/31/15 12:14 AM  Result Value Ref Range Status   Specimen Description BLOOD LEFT HAND  Final   Special Requests BOTTLES DRAWN AEROBIC ONLY 5CC  Final   Culture   Final    NO GROWTH 2 DAYS Performed at  [facility]    Report Status PENDING  Incomplete    Radiology Reports Dg Chest 2 View  06/02/2015  CLINICAL DATA:  Chest congestion and shortness of Breath EXAM: CHEST  2 VIEW COMPARISON:  05/30/2015 FINDINGS: Cardiac shadow is stable. Persistent left basilar infiltrate is noted. Some increasing right basilar infiltrate is seen as well. Bilateral pleural effusions are noted. Changes of prior vertebral augmentation are seen. IMPRESSION: Bibasilar infiltrates left greater than right with associated effusions. Electronically Signed   By: Alcide Clever M.D.   On: 06/02/2015 08:57   Dg Chest 2 View  05/30/2015  CLINICAL DATA:  Pt here from home, she was seen at PCP and had a chest xray that showed pneumonia and she was told to come to ED. EXAM: CHEST  2 VIEW COMPARISON:  05/29/2015 FINDINGS: Lung base opacity is noted, less prominent than on the previous day's study. On the lateral view this appears to be streaky peribronchovascular opacity, seen to a lesser degree in the perihilar regions. This is consistent with bronchial inflammation. May be all chronic bronchitic change. A component acute bronchitis is possible. There is no convincing pneumonia. No pulmonary edema. No pleural effusion or pneumothorax. Cardiac silhouette is mildly enlarged. No mediastinal or hilar masses or convincing adenopathy. Bony thorax is demineralized. Vertebroplasty has been performed in upper lumbar vertebrae, stable from the prior study. IMPRESSION: 1. No convincing pneumonia. 2. Findings described above may be due to chronic bronchitic change. Superimposed acute bronchitis is possible. Electronically Signed   By: Amie Portland M.D.   On: 05/30/2015 19:22   Dg Chest 2 View  05/29/2015  CLINICAL DATA:  82 year old  presenting with acute onset of cough and nausea. EXAM: CHEST  2 VIEW COMPARISON:  01/10/2015 and earlier. FINDINGS: Suboptimal inspiration accounts for crowded bronchovascular markings, especially in the bases, and accentuates the cardiac silhouette. Taking this into account, cardiac silhouette moderately enlarged, unchanged. Thoracic aorta tortuous atherosclerotic, unchanged. Hilar and mediastinal contours otherwise unremarkable. Bronchiectasis involving the lower lobes, right greater than left. Marked central peribronchial thickening, more so than on the prior examination. Airspace consolidation in both lower lobes, right greater than left. Possible small bilateral pleural effusions. Pulmonary vascularity normal. Exaggeration of the usual thoracic kyphosis in part related to old compression fractures of L1 with augmentation and of T12. IMPRESSION: Acute bilateral lower lobe pneumonia superimposed upon moderate to severe changes of acute bronchitis and/or asthma. Electronically Signed   By: Hulan Saas M.D.   On: 05/29/2015 16:51    CBC  Recent Labs Lab 05/30/15 1852 05/31/15 0545 06/01/15 0451 06/02/15 0432 06/03/15 0558  WBC 4.7 4.0 5.2 6.6 8.0  HGB 11.9* 11.4* 11.0* 11.0* 10.9*  HCT 37.0 35.9* 35.1* 33.7* 32.8*  PLT 120* 114* 112* 122* 125*  MCV 85.1 84.1 86.9 83.0 82.6  MCH 27.4 26.7 27.2 27.1 27.5  MCHC 32.2 31.8 31.3 32.6 33.2  RDW 16.6* 16.3* 16.7* 16.8* 16.8*  LYMPHSABS 0.8  --   --   --   --   MONOABS 0.7  --   --   --   --   EOSABS 0.0  --   --   --   --   BASOSABS 0.0  --   --   --   --     Chemistries   Recent Labs Lab 05/30/15 1852 05/31/15 0545 06/01/15 0451 06/02/15 0432 06/03/15 0558  NA 135 139 143 144 141  K 3.3* 3.2* 3.7 4.0 4.1  CL 98* 101  108 108 108  CO2 26 27 24  21* 25  GLUCOSE 123* 108* 104* 120* 124*  BUN 38* 30* 24* 19 18  CREATININE 1.82* 1.48* 1.24* 1.11* 1.14*  CALCIUM 8.3* 8.1* 8.2* 8.1* 8.8*  MG  --   --  1.6* 2.5*  --   AST 53*  --    --   --   --   ALT 33  --   --   --   --   ALKPHOS 54  --   --   --   --   BILITOT 0.8  --   --   --   --    ------------------------------------------------------------------------------------------------------------------ estimated creatinine clearance is 28.9 mL/min (by C-G formula based on Cr of 1.14). ------------------------------------------------------------------------------------------------------------------ No results for input(s): HGBA1C in the last 72 hours. ------------------------------------------------------------------------------------------------------------------ No results for input(s): CHOL, HDL, LDLCALC, TRIG, CHOLHDL, LDLDIRECT in the last 72 hours. ------------------------------------------------------------------------------------------------------------------ No results for input(s): TSH, T4TOTAL, T3FREE, THYROIDAB in the last 72 hours.  Invalid input(s): FREET3 ------------------------------------------------------------------------------------------------------------------ No results for input(s): VITAMINB12, FOLATE, FERRITIN, TIBC, IRON, RETICCTPCT in the last 72 hours.  Coagulation profile  Recent Labs Lab 05/30/15 1852 05/31/15 0545 06/01/15 0451 06/02/15 0432 06/03/15 0558  INR 1.85* 1.94* 3.91* 4.96* 4.78*    No results for input(s): DDIMER in the last 72 hours.  Cardiac Enzymes  Recent Labs Lab 05/31/15 0014 05/31/15 0545 05/31/15 1230  TROPONINI 0.09* 0.07* 0.07*   ------------------------------------------------------------------------------------------------------------------ Invalid input(s): POCBNP    Alleah Dearman D.O. on 06/03/2015 at 12:27 PM  Between 7am to 7pm - Pager - [phone]  After 7pm go to [email or web address] - password [email or web address]  And look for the night coverage person covering for me after hours  [facility] Office  [phone]

## 2015-06-03 NOTE — Progress Notes (Signed)
SLP Cancellation Note  Patient Details Name: Tonya Farley MRN: [id number] DOB: 07/04/1919   Cancelled treatment:       Reason Eval/Treat Not Completed: Patient at procedure or test/unavailable. Will f/u    Hatley Henegar, Riley NearingBonnie Caroline 06/03/2015, 2:57 PM

## 2015-06-04 ENCOUNTER — Inpatient Hospital Stay (HOSPITAL_COMMUNITY): Payer: Medicare Other

## 2015-06-04 DIAGNOSIS — R0600 Dyspnea, unspecified: Secondary | ICD-10-CM

## 2015-06-04 LAB — ECHOCARDIOGRAM COMPLETE
HEIGHTINCHES: 64 in
WEIGHTICAEL: 2339.2 [oz_av]

## 2015-06-04 LAB — CBC
HCT: 31.9 % — ABNORMAL LOW (ref 36.0–46.0)
Hemoglobin: 10.6 g/dL — ABNORMAL LOW (ref 12.0–15.0)
MCH: 27.2 pg (ref 26.0–34.0)
MCHC: 33.2 g/dL (ref 30.0–36.0)
MCV: 81.8 fL (ref 78.0–100.0)
PLATELETS: 140 10*3/uL — AB (ref 150–400)
RBC: 3.9 MIL/uL (ref 3.87–5.11)
RDW: 16.7 % — ABNORMAL HIGH (ref 11.5–15.5)
WBC: 10.2 10*3/uL (ref 4.0–10.5)

## 2015-06-04 LAB — BASIC METABOLIC PANEL
Anion gap: 10 (ref 5–15)
BUN: 29 mg/dL — AB (ref 6–20)
CO2: 26 mmol/L (ref 22–32)
CREATININE: 1.21 mg/dL — AB (ref 0.44–1.00)
Calcium: 9.2 mg/dL (ref 8.9–10.3)
Chloride: 105 mmol/L (ref 101–111)
GFR calc Af Amer: 43 mL/min — ABNORMAL LOW (ref >60)
GFR, EST NON AFRICAN AMERICAN: 37 mL/min — AB (ref >60)
GLUCOSE: 138 mg/dL — AB (ref 65–99)
Potassium: 4.1 mmol/L (ref 3.5–5.1)
SODIUM: 141 mmol/L (ref 135–145)

## 2015-06-04 LAB — PROTIME-INR
INR: 4.45 — ABNORMAL HIGH (ref 0.00–1.49)
Prothrombin Time: 41.2 seconds — ABNORMAL HIGH (ref 11.6–15.2)

## 2015-06-04 MED ORDER — LORAZEPAM 0.5 MG PO TABS
0.5000 mg | ORAL_TABLET | Freq: Once | ORAL | Status: AC
Start: 2015-06-04 — End: 2015-06-04
  Administered 2015-06-04: 0.5 mg via ORAL
  Filled 2015-06-04: qty 1

## 2015-06-04 NOTE — Progress Notes (Signed)
  Echocardiogram 2D Echocardiogram has been performed.  Tye SavoyCasey N Prabhav Faulkenberry 06/04/2015, 9:30 AM

## 2015-06-04 NOTE — Evaluation (Signed)
Clinical/Bedside Swallow Evaluation Patient Details  Name: Tonya Farley MRN: [id number] Date of Birth: 08/27/1919  Today's Date: 06/04/2015 Time: SLP Start Time (ACUTE ONLY): 1510 SLP Stop Time (ACUTE ONLY): 1528 SLP Time Calculation (min) (ACUTE ONLY): 18 min  Past Medical History:  Past Medical History  Diagnosis Date  . Hypertension   . Hyperlipidemia   . Thyroid disease   . Hypothyroidism   . Chronic back pain   . PONV (postoperative nausea and vomiting)   . Chronic a-fib (HCC)   . A-fib [facility](HCC)    Past Surgical History:  Past Surgical History  Procedure Laterality Date  . Appendectomy    . Tonsillectomy    . Cataracts      bilateral  . Eye surgery      left eye "hole"   HPI:  Tonya Farley is a 82 y.o. female who presents to the ED with c/o weakness. She has had 4 day history of cough, nausea, decreased PO intake, generalized weakness. All of this onset late last week as a URI which then moved down into her chest. She saw her PCP yesterday who did a CXR (which is available on our Epic system) which demonstrated BLL PNA),    Assessment / Plan / Recommendation Clinical Impression  Pt did not demosntrate any signs of aspriation during assessment. She was not coughing prior to PO and she did not cough after, vocal quality was dry though pt did have a persistent wheeze prior to and after POs given. She was pushed to take several consecutive straw sips, again with no difficulty. She reports no history of trouble swallowing and denies any recent pna prior to this admission. Pts airway protection appears adequate and if any deficits do exist, they may be esopahgeal in nature. Offered basic aspiration and esophageal precautions to pt and her daughter. No SLP f/u needed at this time.     Aspiration Risk  Mild aspiration risk    Diet Recommendation Regular;Thin liquid   Liquid Administration via: Cup;Straw Medication Administration: Whole meds with puree Supervision:  Patient able to self feed Compensations: Follow solids with liquid Postural Changes: Seated upright at 90 degrees;Remain upright for at least 30 minutes after po intake    Other  Recommendations Oral Care Recommendations: Oral care BID   Follow up Recommendations  None    Frequency and Duration            Prognosis        Swallow Study   General HPI: Tonya Farley is a 82 y.o. female who presents to the ED with c/o weakness. She has had 4 day history of cough, nausea, decreased PO intake, generalized weakness. All of this onset late last week as a URI which then moved down into her chest. She saw her PCP yesterday who did a CXR (which is available on our Epic system) which demonstrated BLL PNA),  Type of Study: Bedside Swallow Evaluation Previous Swallow Assessment: none Diet Prior to this Study: Regular;Thin liquids Temperature Spikes Noted: No Respiratory Status: Nasal cannula History of Recent Intubation: No Behavior/Cognition: Alert;Cooperative;Pleasant mood Oral Cavity Assessment: Within Functional Limits Oral Care Completed by SLP: No Oral Cavity - Dentition: Dentures, top;Dentures, bottom Vision: Functional for self-feeding Self-Feeding Abilities: Able to feed self Patient Positioning: Upright in chair Baseline Vocal Quality: Normal Volitional Cough: Congested Volitional Swallow: Able to elicit    Oral/Motor/Sensory Function Overall Oral Motor/Sensory Function: Within functional limits   Ice Chips     Thin Liquid  Thin Liquid: Within functional limits Presentation: Cup;Straw;Self Fed    Nectar Thick Nectar Thick Liquid: Not tested   Honey Thick Honey Thick Liquid: Not tested   Puree Puree: Within functional limits   Solid   GO   Solid: Within functional limits       [facility], [place] CCC-SLP [phone]  Claudine Mouton 06/04/2015,3:32 PM

## 2015-06-04 NOTE — Progress Notes (Signed)
[facility]                                                                              Patient Demographics  Tonya Farley, is a 82 y.o. female, DOB - 05/13/1919, ZOX:[id number]  Admit date - 05/30/2015   Admitting Physician Hillary BowJared M Gardner, DO  Outpatient Primary MD for the patient is ROBERTS, Tonya AmmonsONALD WAYNE, MD  LOS - 5   Chief Complaint  Patient presents with  . Weakness      HPI on 05/30/2015 by Dr. Lyda PeroneJared Gardner [name] is a 82 y.o. female who presents to the ED with c/o weakness. She has had 4 day history of cough, nausea, decreased PO intake, generalized weakness. All of this onset late last week as a URI which then moved down into her chest. She saw her PCP yesterday who did a CXR (which is available on our Epic system) which demonstrated BLL PNA), labs were drawn this morning which showed dehydration with K of 2.5, elevated BUN. Patient was sent to the ED. Daughter gave patient Gatorade in the mean time.  Assessment & Plan   Community acquired pneumonia/ Influenza A -Chest x-ray 05/29/2015 did show: Acute bilateral lower lobe pneumonia -Chest x-ray 05/30/2015 showed acute bronchitis -Influenza A positive -Continue Levaquin, patient has been insulin and sulfa allergies -Will not start Tamiflu at this time as patient's symptoms have been ongoing for more than 5 days prior to admission. -CXR 06/02/2015: Bibasilar infiltrates left greater than right, associated with effusions. -Pending echocardiogram (patient did receive IVF upon admission) -Spoke with respiratory therapy, hypertonic neb with little affect.  Will order Chest PT.  -?aspiration, speech therapy consulted for swallow eval  Upper extremity edema/Bilateral pleural effusions -No known history of CHF -CXR showed B/L pleural effusions -Patient did receive IVF upon admission.  -IVF discontinued several days ago.   -Continue IV lasix -Pending Echocardiogram -BNP 358 -Monitor intake/output, daily  weights  Acute on chronic kidney disease, stage III -Upon admission, creatinine 1.82.  -Currently 1.2 -Monitor BMP  Hypokalemia -Resovled, Continue to  monitor BMP -Magnesium 2.5   Atrial fibrillation -Continue Coumadin per pharmacy -Continue diltiazem, labetalol -CHADSVASC 4 (age, gender, HTN)  Mildly elevated troponin -Likely secondary to above -Currently denies chest pain -Peak troponin 0.11, currently trending downward 0.07  Physical deconditioning -Like due to pna/flu -PT consulted and recommended SNF  Malnutrition -Decreased oral intake- possible secondary to the above.  -nutrition consulted and appreciated  Code Status: DNR  Family Communication: None at bedside. Spoke with daughter via phone.  Disposition Plan: Admitted. Pending improvement in respiratory status and echocardiogram, swallow study.   Time Spent in minutes   30 minutes  Procedures  Echocardiogram  Consults   None  DVT Prophylaxis  Coumadin  Lab Results  Component Value Date   PLT 140* 06/04/2015    Medications  Scheduled Meds: . albuterol  2.5 mg Nebulization TID  . antiseptic oral rinse  7 mL Mouth Rinse BID  . atorvastatin  10 mg Oral Daily  . brimonidine  1 drop Both Eyes TID  . dextromethorphan-guaiFENesin  1 tablet Oral BID  . diltiazem  360 mg Oral Daily  . feeding supplement (ENSURE  ENLIVE)  237 mL Oral BID BM  . labetalol  200 mg Oral BID  . latanoprost  1 drop Both Eyes QHS  . levofloxacin (LEVAQUIN) IV  750 mg Intravenous Q48H  . levothyroxine  100 mcg Oral QAC breakfast  . timolol  1 drop Both Eyes q morning - 10a  . Warfarin - Pharmacist Dosing Inpatient   Does not apply q1800   Continuous Infusions:   PRN Meds:.albuterol, ondansetron (ZOFRAN) IV  Antibiotics    Anti-infectives    Start     Dose/Rate Route Frequency Ordered Stop   06/01/15 2200  levofloxacin (LEVAQUIN) IVPB 750 mg     750 mg 100 mL/hr over 90 Minutes Intravenous Every 48 hours 05/31/15 0747      05/30/15 2130  levofloxacin (LEVAQUIN) IVPB 750 mg  Status:  Discontinued     750 mg 100 mL/hr over 90 Minutes Intravenous Every 24 hours 05/30/15 2120 05/31/15 0747   05/30/15 2115  levofloxacin (LEVAQUIN) tablet 500 mg  Status:  Discontinued     500 mg Oral Daily 05/30/15 2103 05/30/15 2104   05/30/15 2115  cefTRIAXone (ROCEPHIN) 1 g in dextrose 5 % 50 mL IVPB  Status:  Discontinued     1 g 100 mL/hr over 30 Minutes Intravenous Every 24 hours 05/30/15 2108 05/30/15 2118   05/30/15 2115  azithromycin (ZITHROMAX) tablet 500 mg  Status:  Discontinued     500 mg Oral Every 24 hours 05/30/15 2108 05/30/15 2118       Subjective:   [name] seen and examined today.  Still unable to cough anything up. Does not feel well this morning.  Ate some of her breakfast.  Feels breathing is about the same as previous days. Denies chest pain, abdominal pain, nausea or vomiting, constipation, dizziness or headache.    Objective:   Filed Vitals:   06/03/15 2048 06/03/15 2102 06/04/15 0459 06/04/15 0812  BP:  143/59 110/67   Pulse:  67 83   Temp:  98 F (36.7 C) 97.5 F (36.4 C)   TempSrc:  Oral Oral   Resp:  22 17   Height:      Weight:      SpO2: 95% 98% 100% 98%    Wt Readings from Last 3 Encounters:  05/30/15 66.316 kg (146 lb 3.2 oz)  01/10/15 68.04 kg (150 lb)  08/08/14 70.217 kg (154 lb 12.8 oz)     Intake/Output Summary (Last 24 hours) at 06/04/15 1022 Last data filed at 06/04/15 0831  Gross per 24 hour  Intake    930 ml  Output      0 ml  Net    930 ml    Exam  General: Well developed, well nourished, NAD  HEENT: NCAT,mucous membranes moist.   Cardiovascular: S1 S2 auscultated, IRR   Respiratory: Course breath sounds, +rhonchi, expiratory wheezing. Upper airway congestion  Abdomen: Soft, nontender, nondistended, + bowel sounds  Extremities: warm dry without cyanosis clubbing or edema in LE.  B/L upper ext edema.  Neuro: AAOx3, nonfocal  Psych: Normal  affect and demeanor, pleasant  Data Review   Micro Results Recent Results (from the past 240 hour(s))  Culture, blood (routine x 2) Call MD if unable to obtain prior to antibiotics being given     Status: None (Preliminary result)   Collection Time: 05/31/15 12:14 AM  Result Value Ref Range Status   Specimen Description BLOOD LEFT ANTECUBITAL  Final   Special Requests BOTTLES DRAWN AEROBIC ONLY 5CC  Final   Culture   Final    NO GROWTH 4 DAYS Performed at [facility]    Report Status PENDING  Incomplete  Culture, blood (routine x 2) Call MD if unable to obtain prior to antibiotics being given     Status: None (Preliminary result)   Collection Time: 05/31/15 12:14 AM  Result Value Ref Range Status   Specimen Description BLOOD LEFT HAND  Final   Special Requests BOTTLES DRAWN AEROBIC ONLY 5CC  Final   Culture   Final    NO GROWTH 4 DAYS Performed at [facility]    Report Status PENDING  Incomplete    Radiology Reports Dg Chest 2 View  06/02/2015  CLINICAL DATA:  Chest congestion and shortness of Breath EXAM: CHEST  2 VIEW COMPARISON:  05/30/2015 FINDINGS: Cardiac shadow is stable. Persistent left basilar infiltrate is noted. Some increasing right basilar infiltrate is seen as well. Bilateral pleural effusions are noted. Changes of prior vertebral augmentation are seen. IMPRESSION: Bibasilar infiltrates left greater than right with associated effusions. Electronically Signed   By: Alcide Clever M.D.   On: 06/02/2015 08:57   Dg Chest 2 View  05/30/2015  CLINICAL DATA:  Pt here from home, she was seen at PCP and had a chest xray that showed pneumonia and she was told to come to ED. EXAM: CHEST  2 VIEW COMPARISON:  05/29/2015 FINDINGS: Lung base opacity is noted, less prominent than on the previous day's study. On the lateral view this appears to be streaky peribronchovascular opacity, seen to a lesser degree in the perihilar regions. This is consistent with bronchial  inflammation. May be all chronic bronchitic change. A component acute bronchitis is possible. There is no convincing pneumonia. No pulmonary edema. No pleural effusion or pneumothorax. Cardiac silhouette is mildly enlarged. No mediastinal or hilar masses or convincing adenopathy. Bony thorax is demineralized. Vertebroplasty has been performed in upper lumbar vertebrae, stable from the prior study. IMPRESSION: 1. No convincing pneumonia. 2. Findings described above may be due to chronic bronchitic change. Superimposed acute bronchitis is possible. Electronically Signed   By: Amie Portland M.D.   On: 05/30/2015 19:22   Dg Chest 2 View  05/29/2015  CLINICAL DATA:  82 year old presenting with acute onset of cough and nausea. EXAM: CHEST  2 VIEW COMPARISON:  01/10/2015 and earlier. FINDINGS: Suboptimal inspiration accounts for crowded bronchovascular markings, especially in the bases, and accentuates the cardiac silhouette. Taking this into account, cardiac silhouette moderately enlarged, unchanged. Thoracic aorta tortuous atherosclerotic, unchanged. Hilar and mediastinal contours otherwise unremarkable. Bronchiectasis involving the lower lobes, right greater than left. Marked central peribronchial thickening, more so than on the prior examination. Airspace consolidation in both lower lobes, right greater than left. Possible small bilateral pleural effusions. Pulmonary vascularity normal. Exaggeration of the usual thoracic kyphosis in part related to old compression fractures of L1 with augmentation and of T12. IMPRESSION: Acute bilateral lower lobe pneumonia superimposed upon moderate to severe changes of acute bronchitis and/or asthma. Electronically Signed   By: Hulan Saas M.D.   On: 05/29/2015 16:51    CBC  Recent Labs Lab 05/30/15 1852 05/31/15 0545 06/01/15 0451 06/02/15 0432 06/03/15 0558 06/04/15 0620  WBC 4.7 4.0 5.2 6.6 8.0 10.2  HGB 11.9* 11.4* 11.0* 11.0* 10.9* 10.6*  HCT 37.0 35.9*  35.1* 33.7* 32.8* 31.9*  PLT 120* 114* 112* 122* 125* 140*  MCV 85.1 84.1 86.9 83.0 82.6 81.8  MCH 27.4 26.7 27.2 27.1 27.5 27.2  MCHC  32.2 31.8 31.3 32.6 33.2 33.2  RDW 16.6* 16.3* 16.7* 16.8* 16.8* 16.7*  LYMPHSABS 0.8  --   --   --   --   --   MONOABS 0.7  --   --   --   --   --   EOSABS 0.0  --   --   --   --   --   BASOSABS 0.0  --   --   --   --   --     Chemistries   Recent Labs Lab 05/30/15 1852 05/31/15 0545 06/01/15 0451 06/02/15 0432 06/03/15 0558 06/04/15 0620  NA 135 139 143 144 141 141  K 3.3* 3.2* 3.7 4.0 4.1 4.1  CL 98* 101 108 108 108 105  CO2 26 27 24  21* 25 26  GLUCOSE 123* 108* 104* 120* 124* 138*  BUN 38* 30* 24* 19 18 29*  CREATININE 1.82* 1.48* 1.24* 1.11* 1.14* 1.21*  CALCIUM 8.3* 8.1* 8.2* 8.1* 8.8* 9.2  MG  --   --  1.6* 2.5*  --   --   AST 53*  --   --   --   --   --   ALT 33  --   --   --   --   --   ALKPHOS 54  --   --   --   --   --   BILITOT 0.8  --   --   --   --   --    ------------------------------------------------------------------------------------------------------------------ estimated creatinine clearance is 27.2 mL/min (by C-G formula based on Cr of 1.21). ------------------------------------------------------------------------------------------------------------------ No results for input(s): HGBA1C in the last 72 hours. ------------------------------------------------------------------------------------------------------------------ No results for input(s): CHOL, HDL, LDLCALC, TRIG, CHOLHDL, LDLDIRECT in the last 72 hours. ------------------------------------------------------------------------------------------------------------------ No results for input(s): TSH, T4TOTAL, T3FREE, THYROIDAB in the last 72 hours.  Invalid input(s): FREET3 ------------------------------------------------------------------------------------------------------------------ No results for input(s): VITAMINB12, FOLATE, FERRITIN, TIBC, IRON,  RETICCTPCT in the last 72 hours.  Coagulation profile  Recent Labs Lab 05/31/15 0545 06/01/15 0451 06/02/15 0432 06/03/15 0558 06/04/15 0620  INR 1.94* 3.91* 4.96* 4.78* 4.45*    No results for input(s): DDIMER in the last 72 hours.  Cardiac Enzymes  Recent Labs Lab 05/31/15 0014 05/31/15 0545 05/31/15 1230  TROPONINI 0.09* 0.07* 0.07*   ------------------------------------------------------------------------------------------------------------------ Invalid input(s): POCBNP    Debbora Ang D.O. on 06/04/2015 at 10:22 AM  Between 7am to 7pm - Pager - [phone]  After 7pm go to [email or web address] - password [email or web address]  And look for the night coverage person covering for me after hours  [facility] Office  [phone]

## 2015-06-04 NOTE — Progress Notes (Signed)
ANTICOAGULATION CONSULT NOTE - follow up  Pharmacy Consult for Warfarin Indication: atrial fibrillation  Allergies  Allergen Reactions  . Penicillins Anaphylaxis, Hives and Swelling       . Sulfa Antibiotics Anaphylaxis and Swelling    Patient Measurements: Height: 5\' 4"  (162.6 cm) Weight: 146 lb 3.2 oz (66.316 kg) IBW/kg (Calculated) : 54.7  Vital Signs: Temp: 97.5 F (36.4 C) (03/12 0459) Temp Source: Oral (03/12 0459) BP: 110/67 mmHg (03/12 0459) Pulse Rate: 83 (03/12 0459)  Labs:  Recent Labs  06/02/15 0432 06/03/15 0558 06/04/15 0620  HGB 11.0* 10.9* 10.6*  HCT 33.7* 32.8* 31.9*  PLT 122* 125* 140*  LABPROT 43.4* 42.2* 41.2*  INR 4.96* 4.78* 4.45*  CREATININE 1.11* 1.14* 1.21*    Estimated Creatinine Clearance: 27.2 mL/min (by C-G formula based on Cr of 1.21).  Assessment: [age] yoF admitted on 3/7 for pneumonia.  PMH includes HTN, hyperlipidemia, and AFib on chronic warfarin anticoagulation.  PTA Warfarin regimen = 3mg  daily except 2mg  MWF; last reported dose taken on 3/6 @ 21:00. INR upon admission = 1.85 (subtherapeutic). Pharmacy consulted to continue dosing of warfarin.  Today, 06/04/2015  INR 4.45, supratherapeutic-last dose of warfarin 3/8  CBC: Hgb and Plt remain low but stable  No reported bleeding or complications.  Diet: heart healthy, < 50% meal intake charted  Drug-drug interactions:  Levaquin (started 3/7) likely contributed to elevated INR.    Goal of Therapy:  INR 2-3   Plan:  HOLD warfarin dose today. Daily PT/INR. Monitor for signs and symptoms of bleeding.  Arley Phenixllen Takumi Din RPh 06/04/2015, 11:47 AM Pager [phone]

## 2015-06-05 ENCOUNTER — Inpatient Hospital Stay (HOSPITAL_COMMUNITY): Payer: Medicare Other

## 2015-06-05 DIAGNOSIS — J189 Pneumonia, unspecified organism: Secondary | ICD-10-CM

## 2015-06-05 DIAGNOSIS — I272 Other secondary pulmonary hypertension: Secondary | ICD-10-CM

## 2015-06-05 DIAGNOSIS — R05 Cough: Secondary | ICD-10-CM

## 2015-06-05 DIAGNOSIS — J101 Influenza due to other identified influenza virus with other respiratory manifestations: Secondary | ICD-10-CM

## 2015-06-05 LAB — BASIC METABOLIC PANEL
ANION GAP: 9 (ref 5–15)
BUN: 32 mg/dL — AB (ref 6–20)
CALCIUM: 9.7 mg/dL (ref 8.9–10.3)
CO2: 27 mmol/L (ref 22–32)
Chloride: 105 mmol/L (ref 101–111)
Creatinine, Ser: 1.21 mg/dL — ABNORMAL HIGH (ref 0.44–1.00)
GFR calc Af Amer: 43 mL/min — ABNORMAL LOW (ref >60)
GFR calc non Af Amer: 37 mL/min — ABNORMAL LOW (ref >60)
GLUCOSE: 128 mg/dL — AB (ref 65–99)
Potassium: 4.3 mmol/L (ref 3.5–5.1)
Sodium: 141 mmol/L (ref 135–145)

## 2015-06-05 LAB — CULTURE, BLOOD (ROUTINE X 2)
CULTURE: NO GROWTH
Culture: NO GROWTH

## 2015-06-05 LAB — CBC
HEMATOCRIT: 33.6 % — AB (ref 36.0–46.0)
Hemoglobin: 10.7 g/dL — ABNORMAL LOW (ref 12.0–15.0)
MCH: 27.4 pg (ref 26.0–34.0)
MCHC: 31.8 g/dL (ref 30.0–36.0)
MCV: 85.9 fL (ref 78.0–100.0)
Platelets: 177 10*3/uL (ref 150–400)
RBC: 3.91 MIL/uL (ref 3.87–5.11)
RDW: 17.1 % — AB (ref 11.5–15.5)
WBC: 10.7 10*3/uL — ABNORMAL HIGH (ref 4.0–10.5)

## 2015-06-05 LAB — PROTIME-INR
INR: 2.84 — ABNORMAL HIGH (ref 0.00–1.49)
Prothrombin Time: 29.4 seconds — ABNORMAL HIGH (ref 11.6–15.2)

## 2015-06-05 MED ORDER — LEVOFLOXACIN 750 MG PO TABS
750.0000 mg | ORAL_TABLET | ORAL | Status: DC
  Administered 2015-06-05: 750 mg via ORAL
  Filled 2015-06-05: qty 1

## 2015-06-05 MED ORDER — WARFARIN SODIUM 1 MG PO TABS
1.0000 mg | ORAL_TABLET | Freq: Once | ORAL | Status: AC
  Administered 2015-06-05: 1 mg via ORAL
  Filled 2015-06-05: qty 1

## 2015-06-05 MED ORDER — FUROSEMIDE 10 MG/ML IJ SOLN
40.0000 mg | Freq: Every day | INTRAMUSCULAR | Status: DC
  Administered 2015-06-05 – 2015-06-06 (×2): 40 mg via INTRAVENOUS
  Filled 2015-06-05 (×2): qty 4

## 2015-06-05 MED ORDER — ENSURE ENLIVE PO LIQD: 237.0000 mL | Freq: Two times a day (BID) | ORAL | Status: DC | PRN

## 2015-06-05 MED ORDER — FUROSEMIDE 10 MG/ML IJ SOLN: 20.0000 mg | Freq: Every day | INTRAMUSCULAR | Status: DC

## 2015-06-05 NOTE — Care Management Note (Signed)
Case Management Note  Patient Details  Name: Tonya Farley MRN: [id number] Date of Birth: 03/21/1920  Subjective/Objective:PT recc SNF. CSW following.                    Action/Plan:d/c plan SNF   Expected Discharge Date:                  Expected Discharge Plan:  Skilled Nursing Facility  In-House Referral:  Clinical Social Work  Discharge planning Services  CM Consult  Post Acute Care Choice:    Choice offered to:     DME Arranged:    DME Agency:     HH Arranged:    HH Agency:     Status of Service:  In process, will continue to follow  Medicare Important Message Given:  Yes Date Medicare IM Given:    Medicare IM give by:    Date Additional Medicare IM Given:    Additional Medicare Important Message give by:     If discussed at Long Length of Stay Meetings, dates discussed:    Additional Comments:  Tonya Farley, Tonya Badolato, RN 06/05/2015, 1:42 PM

## 2015-06-05 NOTE — Progress Notes (Signed)
ANTICOAGULATION CONSULT NOTE - follow up  Pharmacy Consult for Warfarin Indication: atrial fibrillation  Allergies  Allergen Reactions  . Penicillins Anaphylaxis, Hives and Swelling       . Sulfa Antibiotics Anaphylaxis and Swelling    Patient Measurements: Height: 5\' 4"  (162.6 cm) Weight: 162 lb 1.6 oz (73.528 kg) IBW/kg (Calculated) : 54.7  Vital Signs: Temp: 99.2 F (37.3 C) (03/13 0636) Temp Source: Axillary (03/13 0636) BP: 156/52 mmHg (03/13 0636) Pulse Rate: 72 (03/13 0636)  Labs:  Recent Labs  06/03/15 0558 06/04/15 0620 06/05/15 0453  HGB 10.9* 10.6* 10.7*  HCT 32.8* 31.9* 33.6*  PLT 125* 140* 177  LABPROT 42.2* 41.2* 29.4*  INR 4.78* 4.45* 2.84*  CREATININE 1.14* 1.21* 1.21*    Estimated Creatinine Clearance: 28.5 mL/min (by C-G formula based on Cr of 1.21).  Assessment: [age] yoF admitted on 3/7 for pneumonia.  PMH includes HTN, hyperlipidemia, and AFib on chronic warfarin anticoagulation.  PTA Warfarin regimen = 3mg  daily except 2mg  MWF; last reported dose taken on 3/6 @ 21:00. INR upon admission = 1.85 (subtherapeutic). Pharmacy consulted to continue dosing of warfarin.  Today, 06/05/2015  INR now therapeutic - last dose of warfarin 3/8  CBC: Hgb and Plt remain low but stable  No reported bleeding or complications.  Diet: heart healthy, 25-50% meal intake charted  Drug-drug interactions:  Levaquin (started 3/7) likely contributed to elevated INR.    Goal of Therapy:  INR 2-3   Plan:  Warfarin 1mg  today at 6pm Daily PT/INR. Monitor for signs and symptoms of bleeding.   Hessie KnowsJustin M Neira Bentsen, PharmD, BCPS Pager [phone] 06/05/2015 8:54 AM

## 2015-06-05 NOTE — Progress Notes (Signed)
Nutrition Follow-up  DOCUMENTATION CODES:   Not applicable  INTERVENTION:  - Will d/c Ensure per pt request - Continue to encourage intakes of meals and fluids - RD will continue to monitor for additional needs  NUTRITION DIAGNOSIS:   Inadequate oral intake related to acute illness, nausea, vomiting as evidenced by per patient/family report. -ongoing  GOAL:   Patient will meet greater than or equal to 90% of their needs -unmet on average  MONITOR:   PO intake, Skin, Weight trends, Labs, I & O's  ASSESSMENT:   82 y.o. female who presents to the ED with c/o weakness. She has had 4 day history of cough, nausea, decreased PO intake, generalized weakness. All of this onset late last week as a URI which then moved down into her chest. She saw her PCP yesterday who did a CXR (which is available on our Epic system) which demonstrated BLL PNA), labs were drawn this morning which showed dehydration with K of 2.5, elevated BUN. Patient was sent to the ED. Daughter gave patient Gatorade in the mean time.  3/13 Physical assessment shows no muscle or fat wasting, mild edema present. Per chart review, pt ate 25% of breakfast, 0% of lunch, 25% of dinner 3/11; 55% of breakfast, 50% of lunch, 25% of dinner yesterday (3/12); 40% of breakfast this AM. Pt denies N/V or abdominal pain at this time. She states that she continues with a fair appetite. New consult received for pt who is lactose intolerant. Pt states that this is accurate. She states that Ensure is okay and does not cause GI issues for her (it is a lactose-free item) but that it tastes like medicine and she would prefer to no longer receive it. She states that staff is often encouraging her to eat and drink more at meals and between meals.   Not meeting needs at this time. Discussed other nutrition supplement options with pt and she declines at this time. Medications reviewed. Labs reviewed; BUN/creatinine elevated, GFR: 37.    3/8 -  Per chart review, pt ate 10% of breakfast this AM. - Pt reports that she continues to feel nauseated but denies emesis this AM.  - Chart review indicates last episode of emesis was yesterday (3/7) at 2341.  - Pt states she began feeling nausea with associated weakness and decreased appetite on Friday (05/26/15).  - She states further decline since that time and that she went to PCP on Monday (3/6) and came to the hospital yesterday (3/7).  - Pt requests that physical assessment not be done at this time due to nausea and discomfort.  - Per chart review, pt has lost 4 lbs (3% body weight) in the past 5 months which is not significant for time frame.  - Unable to state malnutrition at this time but will update following physical assessment.  Diet Order:  Diet regular Room service appropriate?: Yes; Fluid consistency:: Thin  Skin:  Wound (see comment) (MSAD bilateral buttocks)  Last BM:  3/9  Height:   Ht Readings from Last 1 Encounters:  05/30/15 5\' 4"  (1.626 m)    Weight:   Wt Readings from Last 1 Encounters:  06/05/15 162 lb 1.6 oz (73.528 kg)    Ideal Body Weight:  54.54 kg (kg)  BMI:  Body mass index is 27.81 kg/(m^2).  Estimated Nutritional Needs:   Kcal:  1350-1550  Protein:  55-65 grams  Fluid:  1.8-2 L/day  EDUCATION NEEDS:   No education needs identified at this time  Jarome Matin, RD, LDN Inpatient Clinical Dietitian Pager # [phone] After hours/weekend pager # [phone]

## 2015-06-05 NOTE — Consult Note (Signed)
Name: Tonya MoatsMamie N Dibartolo MRN: [id number] DOB: 02/06/1920    ADMISSION DATE:  05/30/2015 CONSULTATION DATE:  3/13  REFERRING MD :  Catha GosselinMikhail ([facility])   CHIEF COMPLAINT:  Cough   BRIEF PATIENT DESCRIPTION: 82yo female with hx HTN, AFib on coumadin, CKD III admitted 3/7 with PNA, flu A.   She was treated with IV abx and improving slowly.  PCCM consulted 3/13 for ongoing cough.    SIGNIFICANT EVENTS    STUDIES:     HISTORY OF PRESENT ILLNESS:  82yo female with hx HTN, AFib on coumadin, CKD III admitted 3/7 with PNA, flu A.   She was treated with IV abx and improving slowly.  PCCM consulted 3/13 for ongoing cough.    Pt currently in NAD, sitting OOB in chair.  Denies SOB at rest.  Denies chest pain, purulent sputum.  Does have ongoing cough but it has improved a bit.  Not overly bothersome.  Mild, "rattling cough", intermittently productive.  Denies hemoptysis.   PAST MEDICAL HISTORY :   has a past medical history of Hypertension; Hyperlipidemia; Thyroid disease; Hypothyroidism; Chronic back pain; PONV (postoperative nausea and vomiting); Chronic a-fib (HCC); and A-fib (HCC).  has past surgical history that includes Appendectomy; Tonsillectomy; cataracts; and Eye surgery. Prior to Admission medications   Medication Sig Start Date End Date Taking? Authorizing Provider  alendronate (FOSAMAX) 70 MG tablet Take 70 mg by mouth once a week. Take with a full glass of water on an empty stomach.   Yes Historical Provider, MD  atorvastatin (LIPITOR) 10 MG tablet Take 10 mg by mouth daily.   Yes Historical Provider, MD  B Complex-C (B-COMPLEX WITH VITAMIN C) tablet Take 1 tablet by mouth daily.   Yes Historical Provider, MD  brimonidine (ALPHAGAN) 0.15 % ophthalmic solution Place 1 drop into both eyes 3 (three) times daily.   Yes Historical Provider, MD  Cholecalciferol (VITAMIN D PO) Take by mouth.   Yes Historical Provider, MD  diltiazem (CARDIZEM CD) 360 MG 24 hr capsule Take 1 capsule (360 mg total)  by mouth daily. 05/18/15  Yes Corky CraftsJayadeep S Varanasi, MD  furosemide (LASIX) 40 MG tablet Take 40 mg by mouth daily.    Yes Historical Provider, MD  labetalol (NORMODYNE) 200 MG tablet Take 1 tablet (200 mg total) by mouth 2 (two) times daily. 04/12/15  Yes Corky CraftsJayadeep S Varanasi, MD  latanoprost (XALATAN) 0.005 % ophthalmic solution Place 1 drop into both eyes at bedtime. 01/10/14  Yes Historical Provider, MD  levofloxacin (LEVAQUIN) 500 MG tablet Take 500 mg by mouth daily. 05/29/15  Yes Historical Provider, MD  levothyroxine (SYNTHROID, LEVOTHROID) 100 MCG tablet Take 100 mcg by mouth daily.   Yes Historical Provider, MD  Polyethyl Glycol-Propyl Glycol (SYSTANE OP) Apply 2 drops to eye daily as needed (dry eyes).    Yes Historical Provider, MD  potassium chloride SA (K-DUR,KLOR-CON) 20 MEQ tablet Take 20 mEq by mouth daily.   Yes Historical Provider, MD  timolol (TIMOPTIC) 0.5 % ophthalmic solution Place 1 drop into both eyes every morning.  12/28/13  Yes Historical Provider, MD  warfarin (COUMADIN) 1 MG tablet Take as directed by coumadin clinic Patient taking differently: Take 2-3 mg by mouth See admin instructions. Take 3 mg once daily on Sunday, Tuesday, Thursday, and Saturday.  Take 2 mg once daily on Monday, Wednesday, and Friday. 12/15/14  Yes Corky CraftsJayadeep S Varanasi, MD   Allergies  Allergen Reactions  . Penicillins Anaphylaxis, Hives and Swelling       .  Sulfa Antibiotics Anaphylaxis and Swelling    FAMILY HISTORY:  family history includes Cancer in her mother; Heart attack in her father; Heart disease in her sister and sister; Other in her father. SOCIAL HISTORY:  reports that she has never smoked. She has never used smokeless tobacco. She reports that she does not drink alcohol or use illicit drugs.  REVIEW OF SYSTEMS:   As per HPI - All other systems reviewed and were neg.    SUBJECTIVE:   VITAL SIGNS: Temp:  [97.5 F (36.4 C)-99.2 F (37.3 C)] 99.2 F (37.3 C) (03/13 0636) Pulse  Rate:  [67-72] 72 (03/13 0636) Resp:  [20] 20 (03/13 0636) BP: (149-156)/(52-86) 156/52 mmHg (03/13 0636) SpO2:  [96 %-100 %] 97 % (03/13 0729) Weight:  [73.165 kg (161 lb 4.8 oz)-73.528 kg (162 lb 1.6 oz)] 73.528 kg (162 lb 1.6 oz) (03/13 0636)  PHYSICAL EXAMINATION: General:  Pleasant elderly female, NAD OOB in chair  Neuro:  Awake, alert, appropriate, MAE, gen weakness  HEENT:  Mm moist, no JVD  Cardiovascular:  s1s2 irreg  Lungs:  resps even non labored on [place], occasional mild rattling cough, diminished bases, few scattered rhonchi  Abdomen:  Round, soft, non tender  Musculoskeletal:  Warm and dry, BUE symmetric edema, scant BLE edema    Recent Labs Lab 06/03/15 0558 06/04/15 0620 06/05/15 0453  NA 141 141 141  K 4.1 4.1 4.3  CL 108 105 105  CO2 BUN 18 29* 32*  CREATININE 1.14* 1.21* 1.21*  GLUCOSE 124* 138* 128*    Recent Labs Lab 06/03/15 0558 06/04/15 0620 06/05/15 0453  HGB 10.9* 10.6* 10.7*  HCT 32.8* 31.9* 33.6*  WBC 8.0 10.2 10.7*  PLT 125* 140* 177   No results found.  ASSESSMENT / PLAN:  Cough - suspect r/t flu, PNA.  Mildly productive.  No respiratory distress.  Can be prolonged/lingering with flu. Remains afebrile.  CAP   Flu A  PLAN -  abx per primary  F/u CXR now  Continue pulmonary hygiene - encouraged flutter  Mobilize as able  Cont BD for airway clearance  mucinex  Avoid opioid cough suppression for now in elderly pt  Echo pending - f/u results  Swallow eval pending    Dirk Dress, NP 06/05/2015  11:53 AM Pager: [phone] or [phone]   PCCM Attending Note: Patient seen and examined with nurse practitioner. Please refer to her consult note which I reviewed in detail. Patient reports continued dyspnea as well as nonproductive cough. Cough she reports may be improving very slightly and very slowly. Denies any chest pain, pressure, or tightness. Denies any subjective fever, chills, or sweats.  BP 121/72 mmHg   Pulse 87  Temp(Src) 97.8 F (36.6 C) (Oral)  Resp 18  Ht  (1.626 m)  Wt 162 lb 1.6 oz (73.528 kg)  BMI 27.81 kg/m2  SpO2 98% Gen.: Patient sitting up in chair. No distress. Awake. Pulmonary: Minimal rhonchorous breath sounds. No wheezing. Normal work of breathing on nasal cannula oxygen. Speaking in complete sentences. Abdomen: Soft. Nontender. Nondistended.  CBC Latest Ref Rng 06/05/2015 06/04/2015 06/03/2015  WBC 4.0 - 10.5 K/uL 10.7(H) 10.2 8.0  Hemoglobin 12.0 - 15.0 g/dL 10.7(L) 10.6(L) 10.9(L)  Hematocrit 36.0 - 46.0 % 33.6(L) 31.9(L) 32.8(L)  Platelets 150 - 400 K/uL 177 140(L) 125(L)    BMP Latest Ref Rng 06/05/2015 06/04/2015 06/03/2015  Glucose 65 - 99 mg/dL 191(Y) 782(N) 562(Z)  BUN 6 - 20 mg/dL  32(H) 29(H) 18  Creatinine 0.44 - 1.00 mg/dL 1.61(W) 9.60(A) 5.40(J)  Sodium 135 - 145 mmol/L 141 141 141  Potassium 3.5 - 5.1 mmol/L 4.3 4.1 4.1  Chloride 101 - 111 mmol/L 105 105 108  CO2 22 - 32 mmol/L Calcium 8.9 - 10.3 mg/dL 9.7 9.2 8.1(X)   TTE (12/06/76): Mildly dilated right ventricle with moderately reduced systolic function. Moderate aortic stenosis with severe left atrial dilatation. Elevated pulmonary artery systolic pressure at 65 mmHg.  Portable CXR (06/05/15): Personally reviewed by me. Patient does have some platelike atelectasis in the right lung base. Silhouetting of left hemidiaphragm chronic and unchanged. No new focal opacity.  A/P:  Pleasant 82 year old Caucasian female with influenza a pneumonia/bronchitis. I reviewed her chest x-ray from today which shows no new evidence of an infectious process. She remains afebrile with no leukocytosis. Currently her volume status appears to be mostly even which is reassuring given the aortic stenosis likely contributing to the suggestion of pulmonary hypertension seen on her transthoracic echocardiogram. Her BNP is slightly elevated but similar to prior testing in recent months. I do believe that her persistent  cough is related to her underlying influenza A pneumonia/bronchitis.  1. Influenza A pneumonia/bronchitis: Antibiotic plan per primary service. 2. Persistent cough: Likely secured to #1. Recommend continuing Mucinex & bronchodilator for airway clearance. Recommended utilizing Tessalon Perles for cough suppression in lieu of opioid cough suppressant given her age. 3. Pulmonary hypertension: Likely secondary to left ventricular dysfunction and aortic valve stenosis. No role for pulmonary arterial vasodilator therapy at this time. Recommend close monitoring of volume status.  Thank you for the opportunity to participate in her care. Please contact me if there are any further questions, she has any clinical deterioration, or we could be of any further assistance.  Donna Christen Jamison Neighbor, M.D. [facility] Pager:  [phone] After 3pm or if no response, call [phone] 5:59 PM

## 2015-06-05 NOTE — Progress Notes (Signed)
PHARMACIST - PHYSICIAN COMMUNICATION DR:   Catha GosselinMikhail CONCERNING: Antibiotic IV to Oral Route Change Policy  RECOMMENDATION: This patient is receiving Levaquin by the intravenous route.  Based on criteria approved by the Pharmacy and Therapeutics Committee, the antibiotic(s) is/are being converted to the equivalent oral dose form(s).   DESCRIPTION: These criteria include:  Patient being treated for a respiratory tract infection, urinary tract infection, cellulitis or clostridium difficile associated diarrhea if on metronidazole  The patient is not neutropenic and does not exhibit a GI malabsorption state  The patient is eating (either orally or via tube) and/or has been taking other orally administered medications for a least 24 hours  The patient is improving clinically and has a Tmax < 100.5  If you have questions about this conversion, please contact the Pharmacy Department  []   [phone]( [phone] )  [facility] []   [phone] )  [facility] []   [phone]( [phone] )  [facility] []   [phone]( [phone] )  [facility] [x]   [phone]( [phone] )  [facility]    Hessie KnowsJustin M Abdulrahim Siddiqi, PharmD, [place] Pager [phone] 06/05/2015 8:59 AM

## 2015-06-05 NOTE — Progress Notes (Signed)
Physical Therapy Treatment Patient Details Name: Tonya Farley MRN: [id number] DOB: 07/30/1919 Today's Date: 06/05/2015    History of Present Illness 82 yo female admitted with Pna. Hx of HTN, chronic back pain, A fib.     PT Comments    Pt in recliner on 2 lts avg stas 96%.  Attempted to amb however uncontrolled [facility]ination so [facility]  X ray arrived, so assisted back to recliner.    Follow Up Recommendations  SNF     Equipment Recommendations       Recommendations for Other Services       Precautions / Restrictions Precautions Precautions: Fall Precaution Comments: monitor stats Restrictions Weight Bearing Restrictions: No    Mobility  Bed Mobility               General bed mobility comments: pt OOB in recliner  Transfers Overall transfer level: Needs assistance Equipment used: Rolling walker (2 wheeled) Transfers: Sit to/from Stand           General transfer comment: Assist to rise, stabilize, control descent. Stand [facility]. VCs safety, technique, hand placement.   Ambulation/Gait             General Gait Details: unable due to freq urination/incont   Stairs            Wheelchair Mobility    Modified Rankin (Stroke Patients Only)       Balance                                    Cognition Arousal/Alertness: Awake/alert Behavior During Therapy: WFL for tasks assessed/performed Overall Cognitive Status: Within Functional Limits for tasks assessed                      Exercises      General Comments        Pertinent Vitals/Pain Pain Assessment: No/denies pain    Home Living                      Prior Function            PT Goals (current goals can now be found in the care plan section) Progress towards PT goals: Progressing toward goals    Frequency  Min 3X/week    PT Plan Current plan remains appropriate    Co-evaluation             End of  Session Equipment Utilized During Treatment: Gait belt Activity Tolerance: Patient tolerated treatment well Patient left: in chair;with call bell/phone within reach;with chair alarm set     Time: 1200-1212 PT Time Calculation (min) (ACUTE ONLY): 12 min  Charges:  $Therapeutic Activity: 8-22 mins                    G Codes:      Tonya Farley, Tonya Farley 06/05/2015, 1:16 PM

## 2015-06-05 NOTE — Progress Notes (Signed)
[facility]                                                                              Patient Demographics  Tonya Farley, is a 82 y.o. female, DOB - 16-May-1919, ZOX:[id number]  Admit date - 05/30/2015   Admitting Physician Hillary Bow, DO  Outpatient Primary MD for the patient is ROBERTS, Vernie Ammons, MD  LOS - 6   Chief Complaint  Patient presents with  . Weakness      HPI on 05/30/2015 by Dr. Lyda Perone Tonya Farley is a 82 y.o. female who presents to the ED with c/o weakness. She has had 4 day history of cough, nausea, decreased PO intake, generalized weakness. All of this onset late last week as a URI which then moved down into her chest. She saw her PCP yesterday who did a CXR (which is available on our Epic system) which demonstrated BLL PNA), labs were drawn this morning which showed dehydration with K of 2.5, elevated BUN. Patient was sent to the ED. Daughter gave patient Gatorade in the mean time.  Assessment & Plan   Community acquired pneumonia/ Influenza A -Chest x-ray 05/29/2015 did show: Acute bilateral lower lobe pneumonia -Chest x-ray 05/30/2015 showed acute bronchitis -Influenza A positive -Continue Levaquin, patient has been insulin and sulfa allergies -Will not start Tamiflu at this time as patient's symptoms have been ongoing for more than 5 days prior to admission. -CXR 06/02/2015: Bibasilar infiltrates left greater than right, associated with effusions. -Spoke with respiratory therapy, hypertonic neb with little affect.  Ordered Chest PT. -?aspiration, speech therapy consulted for swallow eval- recommended regular diet, think liquids -Pulmonology consulted and appreciated.  -Repeat CXR pending -Echcoardiogram: EF 55%, intermediate diastolic function, severe LA enlargement, severe pulm HTN  Upper extremity edema/Bilateral pleural effusions/ ?Acute Diastolic dysfunction -No known history of CHF -CXR showed Tonya/L pleural  effusions -Patient did receive IVF upon admission.  -IVF discontinued several days ago.   -Continue IV lasix -Echocaridogram EF 55%, intermediate diastolic function, severe LA enlargement -BNP 358 -Monitor intake/output, daily weights  Acute on chronic kidney disease, stage III -Upon admission, creatinine 1.82.  -Currently 1.2 -Monitor BMP  Hypokalemia -Resovled, Continue to  monitor BMP -Magnesium 2.5   Atrial fibrillation -Continue Coumadin per pharmacy -Continue diltiazem, labetalol -CHADSVASC 4 (age, gender, HTN)  Mildly elevated troponin -Likely secondary to above -Currently denies chest pain -Peak troponin 0.11, currently trending downward 0.07  Physical deconditioning -Like due to pna/flu -PT consulted and recommended SNF  Malnutrition -Decreased oral intake- possible secondary to the above.  -nutrition consulted and appreciated -Ensure discontinued at the request of patient  Code Status: DNR  Family Communication: None at bedside. Spoke with daughter via phone.  Disposition Plan: Admitted. Pending improvement in respiratory status   Time Spent in minutes   30 minutes  Procedures  Echocardiogram  Consults   Pulmonology  DVT Prophylaxis  Coumadin  Lab Results  Component Value Date   PLT 177 06/05/2015    Medications  Scheduled Meds: . albuterol  2.5 mg Nebulization TID  . antiseptic oral rinse  7 mL Mouth Rinse BID  . atorvastatin  10 mg Oral Daily  .  brimonidine  1 drop Both Eyes TID  . dextromethorphan-guaiFENesin  1 tablet Oral BID  . diltiazem  360 mg Oral Daily  . furosemide  40 mg Intravenous Daily  . labetalol  200 mg Oral BID  . latanoprost  1 drop Both Eyes QHS  . levofloxacin  750 mg Oral Q48H  . levothyroxine  100 mcg Oral QAC breakfast  . timolol  1 drop Both Eyes q morning - 10a  . warfarin  1 mg Oral ONCE-1800  . Warfarin - Pharmacist Dosing Inpatient   Does not apply q1800   Continuous Infusions:   PRN Meds:.albuterol,  ondansetron (ZOFRAN) IV  Antibiotics    Anti-infectives    Start     Dose/Rate Route Frequency Ordered Stop   06/05/15 2200  levofloxacin (LEVAQUIN) tablet 750 mg     750 mg Oral Every 48 hours 06/05/15 0900     06/01/15 2200  levofloxacin (LEVAQUIN) IVPB 750 mg  Status:  Discontinued     750 mg 100 mL/hr over 90 Minutes Intravenous Every 48 hours 05/31/15 0747 06/05/15 0859   05/30/15 2130  levofloxacin (LEVAQUIN) IVPB 750 mg  Status:  Discontinued     750 mg 100 mL/hr over 90 Minutes Intravenous Every 24 hours 05/30/15 2120 05/31/15 0747   05/30/15 2115  levofloxacin (LEVAQUIN) tablet 500 mg  Status:  Discontinued     500 mg Oral Daily 05/30/15 2103 05/30/15 2104   05/30/15 2115  cefTRIAXone (ROCEPHIN) 1 g in dextrose 5 % 50 mL IVPB  Status:  Discontinued     1 g 100 mL/hr over 30 Minutes Intravenous Every 24 hours 05/30/15 2108 05/30/15 2118   05/30/15 2115  azithromycin (ZITHROMAX) tablet 500 mg  Status:  Discontinued     500 mg Oral Every 24 hours 05/30/15 2108 05/30/15 2118       Subjective:   Tonya Farley seen and examined today.  Still unable to cough anything up.  Feels better than previous days.  Does not have much of an appetite.  Denies chest pain, abdominal pain, nausea or vomiting, constipation, dizziness or headache.    Objective:   Filed Vitals:   06/04/15 2035 06/04/15 2219 06/05/15 0636 06/05/15 0729  BP: 151/86  156/52   Pulse: 67  72   Temp: 98 F (36.7 C)  99.2 F (37.3 C)   TempSrc: Oral  Axillary   Resp: 20  20   Height:      Weight:   73.528 kg (162 lb 1.6 oz)   SpO2: 96% 98% 99% 97%    Wt Readings from Last 3 Encounters:  06/05/15 73.528 kg (162 lb 1.6 oz)  01/10/15 68.04 kg (150 lb)  08/08/14 70.217 kg (154 lb 12.8 oz)     Intake/Output Summary (Last 24 hours) at 06/05/15 1217 Last data filed at 06/05/15 0700  Gross per 24 hour  Intake   1050 ml  Output    350 ml  Net    700 ml    Exam  General: Well developed, well nourished,  NAD  HEENT: NCAT,mucous membranes moist.   Cardiovascular: S1 S2 auscultated, IRR   Respiratory: Course breath sounds, +rhonchi, expiratory wheezing. Upper airway congestion. Occ cough, nonproductive  Abdomen: Soft, nontender, nondistended, + bowel sounds  Extremities: warm dry without cyanosis clubbing or edema in LE.  Tonya/L upper ext edema.  Neuro: AAOx3, nonfocal  Psych: Normal affect and demeanor, pleasant  Data Review   Micro Results Recent Results (from the past 240 hour(s))  Culture, blood (routine x 2) Call MD if unable to obtain prior to antibiotics being given     Status: None   Collection Time: 05/31/15 12:14 AM  Result Value Ref Range Status   Specimen Description BLOOD LEFT ANTECUBITAL  Final   Special Requests BOTTLES DRAWN AEROBIC ONLY 5CC  Final   Culture   Final    NO GROWTH 5 DAYS Performed at [facility]    Report Status 06/05/2015 FINAL  Final  Culture, blood (routine x 2) Call MD if unable to obtain prior to antibiotics being given     Status: None   Collection Time: 05/31/15 12:14 AM  Result Value Ref Range Status   Specimen Description BLOOD LEFT HAND  Final   Special Requests BOTTLES DRAWN AEROBIC ONLY 5CC  Final   Culture   Final    NO GROWTH 5 DAYS Performed at [facility]    Report Status 06/05/2015 FINAL  Final    Radiology Reports Dg Chest 2 View  06/02/2015  CLINICAL DATA:  Chest congestion and shortness of Breath EXAM: CHEST  2 VIEW COMPARISON:  05/30/2015 FINDINGS: Cardiac shadow is stable. Persistent left basilar infiltrate is noted. Some increasing right basilar infiltrate is seen as well. Bilateral pleural effusions are noted. Changes of prior vertebral augmentation are seen. IMPRESSION: Bibasilar infiltrates left greater than right with associated effusions. Electronically Signed   By: Alcide Clever M.D.   On: 06/02/2015 08:57   Dg Chest 2 View  05/30/2015  CLINICAL DATA:  Pt here from home, she was seen at PCP and had a  chest xray that showed pneumonia and she was told to come to ED. EXAM: CHEST  2 VIEW COMPARISON:  05/29/2015 FINDINGS: Lung base opacity is noted, less prominent than on the previous day's study. On the lateral view this appears to be streaky peribronchovascular opacity, seen to a lesser degree in the perihilar regions. This is consistent with bronchial inflammation. May be all chronic bronchitic change. A component acute bronchitis is possible. There is no convincing pneumonia. No pulmonary edema. No pleural effusion or pneumothorax. Cardiac silhouette is mildly enlarged. No mediastinal or hilar masses or convincing adenopathy. Bony thorax is demineralized. Vertebroplasty has been performed in upper lumbar vertebrae, stable from the prior study. IMPRESSION: 1. No convincing pneumonia. 2. Findings described above may be due to chronic bronchitic change. Superimposed acute bronchitis is possible. Electronically Signed   By: Amie Portland M.D.   On: 05/30/2015 19:22   Dg Chest 2 View  05/29/2015  CLINICAL DATA:  82 year old presenting with acute onset of cough and nausea. EXAM: CHEST  2 VIEW COMPARISON:  01/10/2015 and earlier. FINDINGS: Suboptimal inspiration accounts for crowded bronchovascular markings, especially in the bases, and accentuates the cardiac silhouette. Taking this into account, cardiac silhouette moderately enlarged, unchanged. Thoracic aorta tortuous atherosclerotic, unchanged. Hilar and mediastinal contours otherwise unremarkable. Bronchiectasis involving the lower lobes, right greater than left. Marked central peribronchial thickening, more so than on the prior examination. Airspace consolidation in both lower lobes, right greater than left. Possible small bilateral pleural effusions. Pulmonary vascularity normal. Exaggeration of the usual thoracic kyphosis in part related to old compression fractures of L1 with augmentation and of T12. IMPRESSION: Acute bilateral lower lobe pneumonia  superimposed upon moderate to severe changes of acute bronchitis and/or asthma. Electronically Signed   By: Hulan Saas M.D.   On: 05/29/2015 16:51    CBC  Recent Labs Lab 05/30/15 1852  06/01/15 0451 06/02/15 0432 06/03/15  [id number] 06/04/15 0620 06/05/15 0453  WBC 4.7  < > 5.2 6.6 8.0 10.2 10.7*  HGB 11.9*  < > 11.0* 11.0* 10.9* 10.6* 10.7*  HCT 37.0  < > 35.1* 33.7* 32.8* 31.9* 33.6*  PLT 120*  < > 112* 122* 125* 140* 177  MCV 85.1  < > 86.9 83.0 82.6 81.8 85.9  MCH 27.4  < > 27.2 27.1 27.5 27.2 27.4  MCHC 32.2  < > 31.3 32.6 33.2 33.2 31.8  RDW 16.6*  < > 16.7* 16.8* 16.8* 16.7* 17.1*  LYMPHSABS 0.8  --   --   --   --   --   --   MONOABS 0.7  --   --   --   --   --   --   EOSABS 0.0  --   --   --   --   --   --   BASOSABS 0.0  --   --   --   --   --   --   < > = values in this interval not displayed.  Chemistries   Recent Labs Lab 05/30/15 1852  06/01/15 0451 06/02/15 0432 06/03/15 0558 06/04/15 0620 06/05/15 0453  NA 135  < > 143 144 141 141 141  K 3.3*  < > 3.7 4.0 4.1 4.1 4.3  CL 98*  < > 108 108 108 105 105  CO2 26  < > 24 21* 25 26 27   GLUCOSE 123*  < > 104* 120* 124* 138* 128*  BUN 38*  < > 24* 19 18 29* 32*  CREATININE 1.82*  < > 1.24* 1.11* 1.14* 1.21* 1.21*  CALCIUM 8.3*  < > 8.2* 8.1* 8.8* 9.2 9.7  MG  --   --  1.6* 2.5*  --   --   --   AST 53*  --   --   --   --   --   --   ALT 33  --   --   --   --   --   --   ALKPHOS 54  --   --   --   --   --   --   BILITOT 0.8  --   --   --   --   --   --   < > = values in this interval not displayed. ------------------------------------------------------------------------------------------------------------------ estimated creatinine clearance is 28.5 mL/min (by C-G formula based on Cr of 1.21). ------------------------------------------------------------------------------------------------------------------ No results for input(s): HGBA1C in the last 72  hours. ------------------------------------------------------------------------------------------------------------------ No results for input(s): CHOL, HDL, LDLCALC, TRIG, CHOLHDL, LDLDIRECT in the last 72 hours. ------------------------------------------------------------------------------------------------------------------ No results for input(s): TSH, T4TOTAL, T3FREE, THYROIDAB in the last 72 hours.  Invalid input(s): FREET3 ------------------------------------------------------------------------------------------------------------------ No results for input(s): VITAMINB12, FOLATE, FERRITIN, TIBC, IRON, RETICCTPCT in the last 72 hours.  Coagulation profile  Recent Labs Lab 06/01/15 0451 06/02/15 0432 06/03/15 0558 06/04/15 0620 06/05/15 0453  INR 3.91* 4.96* 4.78* 4.45* 2.84*    No results for input(s): DDIMER in the last 72 hours.  Cardiac Enzymes  Recent Labs Lab 05/31/15 0014 05/31/15 0545 05/31/15 1230  TROPONINI 0.09* 0.07* 0.07*   ------------------------------------------------------------------------------------------------------------------ Invalid input(s): POCBNP    Monicka Cyran D.O. on 06/05/2015 at 12:17 PM  Between 7am to 7pm - Pager - [phone]  After 7pm go to [email or web address] - password [email or web address]  And look for the night coverage person covering for me after hours  [facility] Office  [phone]

## 2015-06-06 ENCOUNTER — Inpatient Hospital Stay (HOSPITAL_COMMUNITY): Payer: Medicare Other

## 2015-06-06 LAB — BASIC METABOLIC PANEL
ANION GAP: 7 (ref 5–15)
BUN: 34 mg/dL — ABNORMAL HIGH (ref 6–20)
CALCIUM: 9.2 mg/dL (ref 8.9–10.3)
CHLORIDE: 102 mmol/L (ref 101–111)
CO2: 28 mmol/L (ref 22–32)
Creatinine, Ser: 1.27 mg/dL — ABNORMAL HIGH (ref 0.44–1.00)
GFR calc non Af Amer: 35 mL/min — ABNORMAL LOW (ref >60)
GFR, EST AFRICAN AMERICAN: 41 mL/min — AB (ref >60)
GLUCOSE: 137 mg/dL — AB (ref 65–99)
POTASSIUM: 4.4 mmol/L (ref 3.5–5.1)
Sodium: 137 mmol/L (ref 135–145)

## 2015-06-06 LAB — CBC
HEMATOCRIT: 32.5 % — AB (ref 36.0–46.0)
HEMOGLOBIN: 10.3 g/dL — AB (ref 12.0–15.0)
MCH: 27.5 pg (ref 26.0–34.0)
MCHC: 31.7 g/dL (ref 30.0–36.0)
MCV: 86.9 fL (ref 78.0–100.0)
Platelets: 192 10*3/uL (ref 150–400)
RBC: 3.74 MIL/uL — ABNORMAL LOW (ref 3.87–5.11)
RDW: 17.2 % — AB (ref 11.5–15.5)
WBC: 11 10*3/uL — AB (ref 4.0–10.5)

## 2015-06-06 LAB — PROTIME-INR
INR: 2.42 — AB (ref 0.00–1.49)
Prothrombin Time: 25.3 seconds — ABNORMAL HIGH (ref 11.6–15.2)

## 2015-06-06 MED ORDER — BENZONATATE 100 MG PO CAPS
200.0000 mg | ORAL_CAPSULE | Freq: Two times a day (BID) | ORAL | Status: DC
  Administered 2015-06-06 – 2015-06-10 (×9): 200 mg via ORAL
  Filled 2015-06-06 (×11): qty 2

## 2015-06-06 MED ORDER — WARFARIN SODIUM 3 MG PO TABS
3.0000 mg | ORAL_TABLET | Freq: Once | ORAL | Status: AC
  Administered 2015-06-06: 3 mg via ORAL
  Filled 2015-06-06: qty 1

## 2015-06-06 MED ORDER — ENSURE ENLIVE PO LIQD
237.0000 mL | Freq: Three times a day (TID) | ORAL | Status: DC | PRN
  Administered 2015-06-06 – 2015-06-07 (×2): 237 mL via ORAL
  Filled 2015-06-06 (×2): qty 237

## 2015-06-06 MED ORDER — FUROSEMIDE 40 MG PO TABS
40.0000 mg | ORAL_TABLET | Freq: Every day | ORAL | Status: DC
  Administered 2015-06-07 – 2015-06-09 (×3): 40 mg via ORAL
  Filled 2015-06-06 (×3): qty 1

## 2015-06-06 NOTE — Progress Notes (Signed)
ANTICOAGULATION CONSULT NOTE - follow up  Pharmacy Consult for Warfarin Indication: atrial fibrillation  Allergies  Allergen Reactions  . Penicillins Anaphylaxis, Hives and Swelling       . Sulfa Antibiotics Anaphylaxis and Swelling    Patient Measurements: Height: 5\' 4"  (162.6 cm) Weight: 159 lb 9.8 oz (72.4 kg) IBW/kg (Calculated) : 54.7  Vital Signs: Temp: 97.7 F (36.5 C) (03/14 0440) Temp Source: Oral (03/14 0440) BP: 144/66 mmHg (03/14 0440) Pulse Rate: 72 (03/14 0440)  Labs:  Recent Labs  06/04/15 0620 06/05/15 0453 06/06/15 0513  HGB 10.6* 10.7* 10.3*  HCT 31.9* 33.6* 32.5*  PLT 140* 177 192  LABPROT 41.2* 29.4* 25.3*  INR 4.45* 2.84* 2.42*  CREATININE 1.21* 1.21* 1.27*    Estimated Creatinine Clearance: 27 mL/min (by C-G formula based on Cr of 1.27).  Assessment: [age] yoF admitted on 3/7 for pneumonia.  PMH includes HTN, hyperlipidemia, and AFib on chronic warfarin anticoagulation.  PTA Warfarin regimen = 3mg  daily except 2mg  MWF; last reported dose taken on 3/6 @ 21:00. INR upon admission = 1.85 (subtherapeutic). Pharmacy consulted to continue dosing of warfarin.  Today, 06/06/2015  INR therapeutic - 1mg  warfarin on 3/13  CBC: Hgb and Plt remain low but stable  No reported bleeding or complications.  Diet: heart healthy, 25-50% meal intake charted  Drug-drug interactions:  Levaquin (started 3/7) likely contributed to elevated INR.    Goal of Therapy:  INR 2-3   Plan:  Warfarin 3mg  today at 6pm per home regimen Daily PT/INR. Monitor for signs and symptoms of bleeding.   Hessie KnowsJustin M Quinto Tippy, PharmD, BCPS Pager [phone] 06/06/2015 8:24 AM

## 2015-06-06 NOTE — Progress Notes (Signed)
Pt's niece told me prior to leaving that she had spoke with her sister, Darel HongJudy, and they do not want MBS done because pt has had it in the past, Dr Catha GosselinMikhail made aware and order obtained to d/c

## 2015-06-06 NOTE — Progress Notes (Addendum)
[facility]                                                                              Patient Demographics  Tonya Farley, is a 82 y.o. female, DOB - Nov 11, 1919, ZOX:[id number]  Admit date - 05/30/2015   Admitting Physician Hillary Bow, DO  Outpatient Primary MD for the patient is ROBERTS, Vernie Ammons, MD  LOS - 7   Chief Complaint  Patient presents with  . Weakness      HPI on 05/30/2015 by Dr. Lyda Perone Tonya Farley is a 82 y.o. female who presents to the ED with c/o weakness. She has had 4 day history of cough, nausea, decreased PO intake, generalized weakness. All of this onset late last week as a URI which then moved down into her chest. She saw her PCP yesterday who did a CXR (which is available on our Epic system) which demonstrated BLL PNA), labs were drawn this morning which showed dehydration with K of 2.5, elevated BUN. Patient was sent to the ED. Daughter gave patient Gatorade in the mean time.  Interim history Admitted for community acquired pneumonia, found to have a fianc. Continues to have nonproductive cough. Echocardiogram obtained, could not evaluate diastolic function. PCCM consulted, had no new recommendations. Continue current therapy.  Patient completed Levaquin course. Will try scheduled tessalon.  Assessment & Plan   Community acquired pneumonia/ Influenza A/ cough -Chest x-ray 05/29/2015 did show: Acute bilateral lower lobe pneumonia -Chest x-ray 05/30/2015 showed acute bronchitis -Influenza A positive -Finished course of Levaquin (has PCN, sulfa) -Will not start Tamiflu at this time as patient's symptoms have been ongoing for more than 5 days prior to admission. -CXR 06/02/2015: Bibasilar infiltrates left greater than right, associated with effusions. -Spoke with respiratory therapy, hypertonic neb with little affect.  Ordered Chest PT. -?aspiration, speech therapy consulted for swallow eval- recommended regular diet, think  liquids -Pulmonology consulted and appreciated -Repeat CXR 06/05/2015: No significant change from the previous exam -Echcoardiogram: EF 55%, intermediate diastolic function, severe LA enlargement, severe pulm HTN -Have Tessalon twice a day  Upper extremity edema/Bilateral pleural effusions/ ?Acute Diastolic dysfunction -No known history of CHF -CXR showed B/L pleural effusions -Patient did receive IVF upon admission.  -IVF discontinued several days ago.   -Continue IV lasix -Echocaridogram EF 55%, intermediate diastolic function, severe LA enlargement -BNP 358 -Monitor intake/output, daily weights -RUE edema persists, will obtain Doppler  Acute on chronic kidney disease, stage III -Upon admission, creatinine 1.82.  -Currently 1.27 -Monitor BMP  Hypokalemia -Resovled, Continue to  monitor BMP -Magnesium 2.5   Atrial fibrillation -Continue Coumadin per pharmacy -Continue diltiazem, labetalol -CHADSVASC 4 (age, gender, HTN)  Mildly elevated troponin -Likely secondary to above -Currently denies chest pain -Peak troponin 0.11, currently trending downward 0.07  Physical deconditioning -Like due to pna/flu -PT consulted and recommended SNF  Malnutrition -Decreased oral intake- possible secondary to the above.  -nutrition consulted and appreciated -Ensure PRN -Albumin 3.5  Code Status: DNR  Family Communication: None at bedside. Spoke with niece via phone.  Disposition Plan: Admitted. Pending improvement in respiratory status   Time Spent in minutes   30 minutes  Procedures  Echocardiogram  Consults   Pulmonology  DVT Prophylaxis  Coumadin  Lab Results  Component Value Date   PLT 192 06/06/2015    Medications  Scheduled Meds: . albuterol  2.5 mg Nebulization TID  . antiseptic oral rinse  7 mL Mouth Rinse BID  . atorvastatin  10 mg Oral Daily  . benzonatate  200 mg Oral BID  . brimonidine  1 drop Both Eyes TID  . dextromethorphan-guaiFENesin  1 tablet  Oral BID  . diltiazem  360 mg Oral Daily  . furosemide  40 mg Intravenous Daily  . labetalol  200 mg Oral BID  . latanoprost  1 drop Both Eyes QHS  . levofloxacin  750 mg Oral Q48H  . levothyroxine  100 mcg Oral QAC breakfast  . timolol  1 drop Both Eyes q morning - 10a  . warfarin  3 mg Oral ONCE-1800  . Warfarin - Pharmacist Dosing Inpatient   Does not apply q1800   Continuous Infusions:   PRN Meds:.albuterol, feeding supplement (ENSURE ENLIVE), ondansetron (ZOFRAN) IV  Antibiotics    Anti-infectives    Start     Dose/Rate Route Frequency Ordered Stop   06/05/15 2200  levofloxacin (LEVAQUIN) tablet 750 mg     750 mg Oral Every 48 hours 06/05/15 0900     06/01/15 2200  levofloxacin (LEVAQUIN) IVPB 750 mg  Status:  Discontinued     750 mg 100 mL/hr over 90 Minutes Intravenous Every 48 hours 05/31/15 0747 06/05/15 0859   05/30/15 2130  levofloxacin (LEVAQUIN) IVPB 750 mg  Status:  Discontinued     750 mg 100 mL/hr over 90 Minutes Intravenous Every 24 hours 05/30/15 2120 05/31/15 0747   05/30/15 2115  levofloxacin (LEVAQUIN) tablet 500 mg  Status:  Discontinued     500 mg Oral Daily 05/30/15 2103 05/30/15 2104   05/30/15 2115  cefTRIAXone (ROCEPHIN) 1 g in dextrose 5 % 50 mL IVPB  Status:  Discontinued     1 g 100 mL/hr over 30 Minutes Intravenous Every 24 hours 05/30/15 2108 05/30/15 2118   05/30/15 2115  azithromycin (ZITHROMAX) tablet 500 mg  Status:  Discontinued     500 mg Oral Every 24 hours 05/30/15 2108 05/30/15 2118       Subjective:   Tonya Farley seen and examined today.  Still unable to cough anything up but did have a coughing spell this morning.  Continues to state that she feels better than previous days.  Denies chest pain, abdominal pain, nausea or vomiting, constipation, dizziness or headache.    Objective:   Filed Vitals:   06/05/15 2234 06/06/15 0024 06/06/15 0440 06/06/15 0820  BP: 150/66 136/57 144/66   Pulse: 72 65 72   Temp:  98.1 F (36.7 C)  97.7 F (36.5 C)   TempSrc:  Oral Oral   Resp:  20 20   Height:      Weight:   72.4 kg (159 lb 9.8 oz)   SpO2:  99% 99% 98%    Wt Readings from Last 3 Encounters:  06/06/15 72.4 kg (159 lb 9.8 oz)  01/10/15 68.04 kg (150 lb)  08/08/14 70.217 kg (154 lb 12.8 oz)     Intake/Output Summary (Last 24 hours) at 06/06/15 1210 Last data filed at 06/06/15 1610  Gross per 24 hour  Intake      0 ml  Output    560 ml  Net   -560 ml    Exam  General: Well developed, well nourished, NAD  HEENT: NCAT,mucous membranes moist.   Cardiovascular: S1 S2 auscultated, IRR   Respiratory: Diminished, but clearing.  Congestion. Occ cough, nonproductive  Abdomen: Soft, nontender, nondistended, + bowel sounds  Extremities: warm dry without cyanosis clubbing or edema in LE.  RUE upper ext edema.  Neuro: AAOx3, nonfocal  Psych: Normal affect and demeanor, pleasant  Data Review   Micro Results Recent Results (from the past 240 hour(s))  Culture, blood (routine x 2) Call MD if unable to obtain prior to antibiotics being given     Status: None   Collection Time: 05/31/15 12:14 AM  Result Value Ref Range Status   Specimen Description BLOOD LEFT ANTECUBITAL  Final   Special Requests BOTTLES DRAWN AEROBIC ONLY 5CC  Final   Culture   Final    NO GROWTH 5 DAYS Performed at [facility]    Report Status 06/05/2015 FINAL  Final  Culture, blood (routine x 2) Call MD if unable to obtain prior to antibiotics being given     Status: None   Collection Time: 05/31/15 12:14 AM  Result Value Ref Range Status   Specimen Description BLOOD LEFT HAND  Final   Special Requests BOTTLES DRAWN AEROBIC ONLY 5CC  Final   Culture   Final    NO GROWTH 5 DAYS Performed at [facility]    Report Status 06/05/2015 FINAL  Final    Radiology Reports Dg Chest 2 View  06/02/2015  CLINICAL DATA:  Chest congestion and shortness of Breath EXAM: CHEST  2 VIEW COMPARISON:  05/30/2015 FINDINGS: Cardiac  shadow is stable. Persistent left basilar infiltrate is noted. Some increasing right basilar infiltrate is seen as well. Bilateral pleural effusions are noted. Changes of prior vertebral augmentation are seen. IMPRESSION: Bibasilar infiltrates left greater than right with associated effusions. Electronically Signed   By: Alcide Clever M.D.   On: 06/02/2015 08:57   Dg Chest 2 View  05/30/2015  CLINICAL DATA:  Pt here from home, she was seen at PCP and had a chest xray that showed pneumonia and she was told to come to ED. EXAM: CHEST  2 VIEW COMPARISON:  05/29/2015 FINDINGS: Lung base opacity is noted, less prominent than on the previous day's study. On the lateral view this appears to be streaky peribronchovascular opacity, seen to a lesser degree in the perihilar regions. This is consistent with bronchial inflammation. May be all chronic bronchitic change. A component acute bronchitis is possible. There is no convincing pneumonia. No pulmonary edema. No pleural effusion or pneumothorax. Cardiac silhouette is mildly enlarged. No mediastinal or hilar masses or convincing adenopathy. Bony thorax is demineralized. Vertebroplasty has been performed in upper lumbar vertebrae, stable from the prior study. IMPRESSION: 1. No convincing pneumonia. 2. Findings described above may be due to chronic bronchitic change. Superimposed acute bronchitis is possible. Electronically Signed   By: Amie Portland M.D.   On: 05/30/2015 19:22   Dg Chest 2 View  05/29/2015  CLINICAL DATA:  82 year old presenting with acute onset of cough and nausea. EXAM: CHEST  2 VIEW COMPARISON:  01/10/2015 and earlier. FINDINGS: Suboptimal inspiration accounts for crowded bronchovascular markings, especially in the bases, and accentuates the cardiac silhouette. Taking this into account, cardiac silhouette moderately enlarged, unchanged. Thoracic aorta tortuous atherosclerotic, unchanged. Hilar and mediastinal contours otherwise unremarkable.  Bronchiectasis involving the lower lobes, right greater than left. Marked central peribronchial thickening, more so than on the prior examination. Airspace consolidation in both lower lobes, right greater than left. Possible small bilateral  pleural effusions. Pulmonary vascularity normal. Exaggeration of the usual thoracic kyphosis in part related to old compression fractures of L1 with augmentation and of T12. IMPRESSION: Acute bilateral lower lobe pneumonia superimposed upon moderate to severe changes of acute bronchitis and/or asthma. Electronically Signed   By: Hulan Saas M.D.   On: 05/29/2015 16:51   Dg Chest Port 1 View  06/05/2015  CLINICAL DATA:  Cough for 2 weeks EXAM: PORTABLE CHEST 1 VIEW COMPARISON:  06/02/2015 FINDINGS: Cardiac shadow is mildly enlarged but stable. Bibasilar infiltrates are again seen and relatively stable. No acute bony abnormality is noted. No new focal infiltrate is seen. IMPRESSION: No significant change from the previous exam. Electronically Signed   By: Alcide Clever M.D.   On: 06/05/2015 12:41    CBC  Recent Labs Lab 05/30/15 1852  06/02/15 0432 06/03/15 0558 06/04/15 0620 06/05/15 0453 06/06/15 0513  WBC 4.7  < > 6.6 8.0 10.2 10.7* 11.0*  HGB 11.9*  < > 11.0* 10.9* 10.6* 10.7* 10.3*  HCT 37.0  < > 33.7* 32.8* 31.9* 33.6* 32.5*  PLT 120*  < > 122* 125* 140* 177 192  MCV 85.1  < > 83.0 82.6 81.8 85.9 86.9  MCH 27.4  < > 27.1 27.5 27.2 27.4 27.5  MCHC 32.2  < > 32.6 33.2 33.2 31.8 31.7  RDW 16.6*  < > 16.8* 16.8* 16.7* 17.1* 17.2*  LYMPHSABS 0.8  --   --   --   --   --   --   MONOABS 0.7  --   --   --   --   --   --   EOSABS 0.0  --   --   --   --   --   --   BASOSABS 0.0  --   --   --   --   --   --   < > = values in this interval not displayed.  Chemistries   Recent Labs Lab 05/30/15 1852  06/01/15 0451 06/02/15 0432 06/03/15 0558 06/04/15 0620 06/05/15 0453 06/06/15 0513  NA 135  < > 143 144 141 141 141 137  K 3.3*  < > 3.7 4.0 4.1  4.1 4.3 4.4  CL 98*  < > 108 108 108 105 105 102  CO2 26  < > 24 21* 25 26 27 28   GLUCOSE 123*  < > 104* 120* 124* 138* 128* 137*  BUN 38*  < > 24* 19 18 29* 32* 34*  CREATININE 1.82*  < > 1.24* 1.11* 1.14* 1.21* 1.21* 1.27*  CALCIUM 8.3*  < > 8.2* 8.1* 8.8* 9.2 9.7 9.2  MG  --   --  1.6* 2.5*  --   --   --   --   AST 53*  --   --   --   --   --   --   --   ALT 33  --   --   --   --   --   --   --   ALKPHOS 54  --   --   --   --   --   --   --   BILITOT 0.8  --   --   --   --   --   --   --   < > = values in this interval not displayed. ------------------------------------------------------------------------------------------------------------------ estimated creatinine clearance is 27 mL/min (by C-G formula based on Cr of 1.27). ------------------------------------------------------------------------------------------------------------------ No results for  input(s): HGBA1C in the last 72 hours. ------------------------------------------------------------------------------------------------------------------ No results for input(s): CHOL, HDL, LDLCALC, TRIG, CHOLHDL, LDLDIRECT in the last 72 hours. ------------------------------------------------------------------------------------------------------------------ No results for input(s): TSH, T4TOTAL, T3FREE, THYROIDAB in the last 72 hours.  Invalid input(s): FREET3 ------------------------------------------------------------------------------------------------------------------ No results for input(s): VITAMINB12, FOLATE, FERRITIN, TIBC, IRON, RETICCTPCT in the last 72 hours.  Coagulation profile  Recent Labs Lab 06/02/15 0432 06/03/15 0558 06/04/15 0620 06/05/15 0453 06/06/15 0513  INR 4.96* 4.78* 4.45* 2.84* 2.42*    No results for input(s): DDIMER in the last 72 hours.  Cardiac Enzymes  Recent Labs Lab 05/31/15 0014 05/31/15 0545 05/31/15 1230  TROPONINI 0.09* 0.07* 0.07*    ------------------------------------------------------------------------------------------------------------------ Invalid input(s): POCBNP    Travonne Schowalter D.O. on 06/06/2015 at 12:10 PM  Between 7am to 7pm - Pager - [phone]  After 7pm go to [email or web address] - password [email or web address]  And look for the night coverage person covering for me after hours  [facility] Office  [phone]

## 2015-06-06 NOTE — Progress Notes (Signed)
Pt refused evening CPT.  RT will continue to monitor as needed.

## 2015-06-06 NOTE — Care Management Important Message (Signed)
Important Message  Patient Details IM Letter given to [name]/Case Manager to present to Patient Name: Tonya Farley MRN: [id number] Date of Birth: 06/20/1919   Medicare Important Message Given:  Yes    [name], Arne Schlender 06/06/2015, 9:15 AMImportant Message  Patient Details  Name: Tonya Farley MRN: [id number] Date of Birth: 02/26/1920   Medicare Important Message Given:  Yes    [name], Nahshon Reich 06/06/2015, 9:15 AM

## 2015-06-06 NOTE — Clinical Social Work Placement (Signed)
Patient has a bed at [facility]. CSW has completed FL2 & will continue to follow and assist with discharge when ready.    Lincoln MaxinKelly Anjalee Cope, LCSW [facility] Clinical Social Worker cell #: [phone]     CLINICAL SOCIAL WORK PLACEMENT  NOTE  Date:  06/06/2015  Patient Details  Name: Tonya Farley MRN: [id number] Date of Birth: 07/13/1919  Clinical Social Work is seeking post-discharge placement for this patient at the Skilled  Nursing Facility level of care (*CSW will initial, date and re-position this form in  chart as items are completed):  Yes   Patient/family provided with [facility] Clinical Social Work Department's list of facilities offering this level of care within the geographic area requested by the patient (or if unable, by the patient's family).  Yes   Patient/family informed of their freedom to choose among providers that offer the needed level of care, that participate in Medicare, Medicaid or managed care program needed by the patient, have an available bed and are willing to accept the patient.  Yes   Patient/family informed of [facility]'s ownership interest in [facility] and [facility], as well as of the fact that they are under no obligation to receive care at these facilities.  PASRR submitted to EDS on 06/01/15     PASRR number received on 06/01/15     Existing PASRR number confirmed on 06/01/15     FL2 transmitted to all facilities in geographic area requested by pt/family on 06/01/15     FL2 transmitted to all facilities within larger geographic area on       Patient informed that his/her managed care company has contracts with or will negotiate with certain facilities, including the following:        Yes   Patient/family informed of bed offers received.  Patient chooses bed at [facility]     Physician recommends and patient chooses bed at      Patient to be transferred to [facility] on  .  Patient to be transferred to facility by [facility]     Patient family notified on   of transfer.  Name of family member notified:        PHYSICIAN       Additional Comment:    _______________________________________________ Arlyss RepressHarrison, Macey Wurtz F, LCSW 06/06/2015, 3:49 PM

## 2015-06-06 NOTE — Progress Notes (Signed)
PT refused CPT for 0800 tx- excessive coughing at this time- RN aware.

## 2015-06-07 ENCOUNTER — Inpatient Hospital Stay (HOSPITAL_COMMUNITY): Payer: Medicare Other

## 2015-06-07 DIAGNOSIS — R609 Edema, unspecified: Secondary | ICD-10-CM

## 2015-06-07 DIAGNOSIS — J09X2 Influenza due to identified novel influenza A virus with other respiratory manifestations: Secondary | ICD-10-CM | POA: Diagnosis present

## 2015-06-07 LAB — CBC
HCT: 31.8 % — ABNORMAL LOW (ref 36.0–46.0)
Hemoglobin: 10.3 g/dL — ABNORMAL LOW (ref 12.0–15.0)
MCH: 26.8 pg (ref 26.0–34.0)
MCHC: 32.4 g/dL (ref 30.0–36.0)
MCV: 82.6 fL (ref 78.0–100.0)
PLATELETS: 237 10*3/uL (ref 150–400)
RBC: 3.85 MIL/uL — AB (ref 3.87–5.11)
RDW: 17.2 % — ABNORMAL HIGH (ref 11.5–15.5)
WBC: 12.5 10*3/uL — AB (ref 4.0–10.5)

## 2015-06-07 LAB — BASIC METABOLIC PANEL
ANION GAP: 8 (ref 5–15)
BUN: 29 mg/dL — AB (ref 6–20)
CALCIUM: 9.5 mg/dL (ref 8.9–10.3)
CO2: 32 mmol/L (ref 22–32)
CREATININE: 1.16 mg/dL — AB (ref 0.44–1.00)
Chloride: 102 mmol/L (ref 101–111)
GFR calc Af Amer: 46 mL/min — ABNORMAL LOW (ref >60)
GFR, EST NON AFRICAN AMERICAN: 39 mL/min — AB (ref >60)
GLUCOSE: 113 mg/dL — AB (ref 65–99)
Potassium: 4.6 mmol/L (ref 3.5–5.1)
Sodium: 142 mmol/L (ref 135–145)

## 2015-06-07 LAB — PROTIME-INR
INR: 2.02 — ABNORMAL HIGH (ref 0.00–1.49)
Prothrombin Time: 22.7 seconds — ABNORMAL HIGH (ref 11.6–15.2)

## 2015-06-07 MED ORDER — METHYLPREDNISOLONE SODIUM SUCC 40 MG IJ SOLR
20.0000 mg | Freq: Every day | INTRAMUSCULAR | Status: DC
  Administered 2015-06-07 – 2015-06-08 (×2): 20 mg via INTRAVENOUS
  Filled 2015-06-07 (×3): qty 0.5

## 2015-06-07 MED ORDER — PANTOPRAZOLE SODIUM 40 MG PO TBEC
40.0000 mg | DELAYED_RELEASE_TABLET | Freq: Every day | ORAL | Status: DC
  Administered 2015-06-08 – 2015-06-18 (×11): 40 mg via ORAL
  Filled 2015-06-07 (×14): qty 1

## 2015-06-07 MED ORDER — OSELTAMIVIR PHOSPHATE 30 MG PO CAPS
30.0000 mg | ORAL_CAPSULE | Freq: Two times a day (BID) | ORAL | Status: DC
  Administered 2015-06-07 – 2015-06-11 (×8): 30 mg via ORAL
  Filled 2015-06-07 (×8): qty 1

## 2015-06-07 MED ORDER — WARFARIN SODIUM 2 MG PO TABS
2.0000 mg | ORAL_TABLET | Freq: Once | ORAL | Status: AC
  Administered 2015-06-07: 2 mg via ORAL
  Filled 2015-06-07: qty 1

## 2015-06-07 MED ORDER — CLINDAMYCIN PHOSPHATE 600 MG/50ML IV SOLN
600.0000 mg | Freq: Three times a day (TID) | INTRAVENOUS | Status: DC
  Administered 2015-06-07 – 2015-06-08 (×3): 600 mg via INTRAVENOUS
  Filled 2015-06-07 (×4): qty 50

## 2015-06-07 MED ORDER — DM-GUAIFENESIN ER 30-600 MG PO TB12
2.0000 | ORAL_TABLET | Freq: Two times a day (BID) | ORAL | Status: DC
  Administered 2015-06-07 – 2015-06-09 (×4): 2 via ORAL
  Filled 2015-06-07 (×5): qty 2

## 2015-06-07 NOTE — Progress Notes (Signed)
*  Preliminary Results* Right upper extremity venous duplex completed. Right upper extremity is negative for deep and superficial vein thrombosis.  06/07/2015 11:01 AM  Gertie FeyMichelle Zell Hylton, RVT, RDCS, RDMS

## 2015-06-07 NOTE — Progress Notes (Signed)
[facility] PROGRESS NOTE  Tonya Farley ZOX:[id number] DOB: Mar 09, 1920 DOA: 05/30/2015 PCP: Lorenda Peck, MD  Summary 06/07/15: I have seen and examined Tonya Farley at bedside in the presence of her niece and reviewed her chart. Tonya Farley is a pleasant 82 y.o. female with paroxysmal atrial fibrillation on Coumadin who presented to the ED with c/o weakness and 4 day history of cough, nausea, decreased PO intake, generalized weakness and she was Admitted for community acquired pneumonia, and found to have Influenza type A. She continues to have nonproductive cough despite completing course of Levaquin, was not given Tamiflu. Echocardiogram obtained, could not evaluate diastolic function. Wonder if patient has recurrent aspiration pneumonia. Her white count is increasing off antibiotics. We'll therefore obtain CT chest without contrast to better evaluate lung parenchyma, give clindamycin/Tamiflu/give low-dose steroids and reassess. Family says patient once her food liberalized and she does not went to have MBS. They understand that she may be at risk for recurrent aspiration pneumonitis. Will increase Mucinex to 1200 mg twice a day. Plan CAP (community acquired pneumonia)Chest congestion/Dyspnea/ Influenza due to identified novel influenza A virus with other respiratory manifestations  CT chest without contrast  Clindamycin/Tamiflu/Solu-Medrol  Increase Mucinex to 1200 twice a day Paroxysmal  Atrial fibrillation (HCC)  Rate controlled  Continue Coumadin per pharmacy target INR 2-3. Appreciate pharmacy help. AKI (acute kidney injury) (HCC)  Likely related to ongoing infection  Improved with hydration. Monitor.   Code Status: DNR Family Communication: Niece at bedside Disposition Plan: Tbd   Consultants:  PCCM  Procedures:    Antibiotics:  Completed Levaquin course  Clindamycin 06/07/2015>>  Tamiflu 06/07/2015>>  HPI/Subjective: Coughing, feeling  weak.  Objective: Filed Vitals:   06/07/15 1517 06/07/15 1543  BP:  142/72  Pulse: 85   Temp:    Resp: 18     Intake/Output Summary (Last 24 hours) at 06/07/15 1622 Last data filed at 06/07/15 0731  Gross per 24 hour  Intake      0 ml  Output    665 ml  Net   -665 ml   Filed Weights   06/05/15 0636 06/06/15 0440 06/07/15 0550  Weight: 73.528 kg (162 lb 1.6 oz) 72.4 kg (159 lb 9.8 oz) 71.5 kg (157 lb 10.1 oz)    Exam:   General:  Comfortable at rest.  Cardiovascular: S1-S2 normal. No murmurs. Pulse regular.  Respiratory: Good air entry bilaterally. Coughing. Scant wheezing. No rhonchi or rales.  Abdomen: Soft and nontender. Normal bowel sounds. No organomegaly.  Musculoskeletal: No pedal edema   Neurological: Intact  Data Reviewed: Basic Metabolic Panel:  Recent Labs Lab 06/01/15 0451 06/02/15 0432 06/03/15 0558 06/04/15 0620 06/05/15 0453 06/06/15 0513 06/07/15 0529  NA 143 144 141 141 141 137 142  K 3.7 4.0 4.1 4.1 4.3 4.4 4.6  CL 108 108 108 105 105 102 102  CO2 24 21* 32  GLUCOSE 104* 120* 124* 138* 128* 137* 113*  BUN 24* 19 18 29* 32* 34* 29*  CREATININE 1.24* 1.11* 1.14* 1.21* 1.21* 1.27* 1.16*  CALCIUM 8.2* 8.1* 8.8* 9.2 9.7 9.2 9.5  MG 1.6* 2.5*  --   --   --   --   --    Liver Function Tests: No results for input(s): AST, ALT, ALKPHOS, BILITOT, PROT, ALBUMIN in the last 168 hours. No results for input(s): LIPASE, AMYLASE in the last 168 hours. No results for input(s): AMMONIA in the last 168 hours. CBC:  Recent  Labs Lab 06/03/15 0558 06/04/15 0620 06/05/15 0453 06/06/15 0513 06/07/15 0529  WBC 8.0 10.2 10.7* 11.0* 12.5*  HGB 10.9* 10.6* 10.7* 10.3* 10.3*  HCT 32.8* 31.9* 33.6* 32.5* 31.8*  MCV 82.6 81.8 85.9 86.9 82.6  PLT 125* 140* 177 192 237   Cardiac Enzymes: No results for input(s): CKTOTAL, CKMB, CKMBINDEX, TROPONINI in the last 168 hours. BNP (last 3 results)  Recent Labs  01/10/15 2016 05/30/15 1852  06/03/15 1611  BNP 382.1* 212.3* 358.1*    ProBNP (last 3 results) No results for input(s): PROBNP in the last 8760 hours.  CBG: No results for input(s): GLUCAP in the last 168 hours.  Recent Results (from the past 240 hour(s))  Culture, blood (routine x 2) Call MD if unable to obtain prior to antibiotics being given     Status: None   Collection Time: 05/31/15 12:14 AM  Result Value Ref Range Status   Specimen Description BLOOD LEFT ANTECUBITAL  Final   Special Requests BOTTLES DRAWN AEROBIC ONLY 5CC  Final   Culture   Final    NO GROWTH 5 DAYS [facility]    Report Status 06/05/2015 FINAL  Final  Culture, blood (routine x 2) Call MD if unable to obtain prior to antibiotics being given     Status: None   Collection Time: 05/31/15 12:14 AM  Result Value Ref Range Status   Specimen Description BLOOD LEFT HAND  Final   Special Requests BOTTLES DRAWN AEROBIC ONLY 5CC  Final   Culture   Final    NO GROWTH 5 DAYS [facility]formed [facility]    Report Status 06/05/2015 FINAL  Final     Studies: No results found.  Scheduled Meds: . albuterol  2.5 mg Nebulization TID  . antiseptic oral rinse  7 mL Mouth Rinse BID  . atorvastatin  10 mg Oral Daily  . benzonatate  200 mg Oral BID  . brimonidine  1 drop Both Eyes TID  . clindamycin (CLEOCIN) IV  600 mg Intravenous Q8H  . dextromethorphan-guaiFENesin  2 tablet Oral BID  . diltiazem  360 mg Oral Daily  . furosemide  40 mg Oral Daily  . labetalol  200 mg Oral BID  . latanoprost  1 drop Both Eyes QHS  . levothyroxine  100 mcg Oral QAC breakfast  . methylPREDNISolone (SOLU-MEDROL) injection  20 mg Intravenous Daily  . oseltamivir  30 mg Oral BID  . [START ON 06/08/2015] pantoprazole  40 mg Oral Daily  . timolol  1 drop Both Eyes q morning - 10a  . warfarin  2 mg Oral ONCE-1800  . Warfarin - Pharmacist Dosing Inpatient   Does not apply q1800   Continuous Infusions:    Time spent: 25  minutes    Paislee Szatkowski  [facility] Pager [phone]. If 7PM-7AM, please contact night-coverage at [email or web address], password [email or web address] 06/07/2015, 4:22 PM  LOS: 8 days

## 2015-06-07 NOTE — Progress Notes (Signed)
ANTICOAGULATION CONSULT NOTE - follow up  Pharmacy Consult for Warfarin Indication: atrial fibrillation  Allergies  Allergen Reactions  . Penicillins Anaphylaxis, Hives and Swelling       . Sulfa Antibiotics Anaphylaxis and Swelling    Patient Measurements: Height: 5\' 4"  (162.6 cm) Weight: 157 lb 10.1 oz (71.5 kg) IBW/kg (Calculated) : 54.7  Vital Signs: Temp: 97.9 F (36.6 C) (03/15 0550) Temp Source: Oral (03/15 0550) BP: 147/79 mmHg (03/15 0550) Pulse Rate: 86 (03/15 0550)  Labs:  Recent Labs  06/05/15 0453 06/06/15 0513 06/07/15 0529  HGB 10.7* 10.3* 10.3*  HCT 33.6* 32.5* 31.8*  PLT 177 192 237  LABPROT 29.4* 25.3* 22.7*  INR 2.84* 2.42* 2.02*  CREATININE 1.21* 1.27* 1.16*    Estimated Creatinine Clearance: 29.4 mL/min (by C-G formula based on Cr of 1.16).  Assessment: [age] yoF admitted on 3/7 for pneumonia.  PMH includes HTN, hyperlipidemia, and AFib on chronic warfarin anticoagulation.  PTA Warfarin regimen = 3mg  daily except 2mg  MWF; last reported dose taken on 3/6 @ 21:00. INR upon admission = 1.85 (subtherapeutic). Pharmacy consulted to continue dosing of warfarin.  Today, 06/07/2015  INR therapeutic - 3 mg warfarin on 3/14  CBC: Hgb and Plt remain low but stable  No reported bleeding or complications.  Diet: heart healthy, 25-50% meal intake charted. No meals charted on 3/14  Drug-drug interactions:  Levaquin (started 3/7) likely contributed to elevated INR. This med has now been stopped on 3/13.    Goal of Therapy:  INR 2-3   Plan:   Warfarin 2 mg today at 6pm per home regimen.  Anticipate return to regimen much like PTA home regimen now that pt is no longer on levofloxacin.  Daily PT/INR.  Monitor for signs and symptoms of bleeding.    Adalberto ColeNikola Rakeen Gaillard, PharmD, BCPS Pager [phone] 06/07/2015 8:17 AM

## 2015-06-07 NOTE — Progress Notes (Signed)
PT Cancellation Note  Patient Details Name: Tonya Farley MRN: [id number] DOB: 01/28/1920   Cancelled Treatment:    Reason Eval/Treat Not Completed: Attempted tx session-pt declined to participate on today. Pt c/o R UE pain and not feeling well. Will check back another day.    Rebeca AlertJannie Tyreek Clabo, MPT Pager: [phone]

## 2015-06-07 NOTE — Progress Notes (Signed)
Received discontinue order, please reorder if indicated.  Upon review of chart, pt with h/o known mild esophageal dysmotility from an evaluation in 2014; if she desired, she may benefit from esophagram to assess esophageal function.  Donavan Burnetamara Hephzibah Strehle, MS [facility] SLP [phone]

## 2015-06-08 LAB — CBC WITH DIFFERENTIAL/PLATELET
Basophils Absolute: 0 10*3/uL (ref 0.0–0.1)
Basophils Relative: 0 %
EOS PCT: 0 %
Eosinophils Absolute: 0 10*3/uL (ref 0.0–0.7)
HEMATOCRIT: 33.7 % — AB (ref 36.0–46.0)
Hemoglobin: 10.8 g/dL — ABNORMAL LOW (ref 12.0–15.0)
Lymphocytes Relative: 1 %
Lymphs Abs: 0.2 10*3/uL — ABNORMAL LOW (ref 0.7–4.0)
MCH: 27.6 pg (ref 26.0–34.0)
MCHC: 32 g/dL (ref 30.0–36.0)
MCV: 86.2 fL (ref 78.0–100.0)
MONOS PCT: 8 %
Monocytes Absolute: 1.2 10*3/uL — ABNORMAL HIGH (ref 0.1–1.0)
NEUTROS PCT: 91 %
Neutro Abs: 13.8 10*3/uL — ABNORMAL HIGH (ref 1.7–7.7)
Platelets: 255 10*3/uL (ref 150–400)
RBC: 3.91 MIL/uL (ref 3.87–5.11)
RDW: 17.2 % — ABNORMAL HIGH (ref 11.5–15.5)
WBC: 15.2 10*3/uL — AB (ref 4.0–10.5)

## 2015-06-08 LAB — PROTIME-INR
INR: 3.19 — ABNORMAL HIGH (ref 0.00–1.49)
Prothrombin Time: 32 seconds — ABNORMAL HIGH (ref 11.6–15.2)

## 2015-06-08 LAB — COMPREHENSIVE METABOLIC PANEL
ALK PHOS: 48 U/L (ref 38–126)
ALT: 10 U/L — AB (ref 14–54)
AST: 21 U/L (ref 15–41)
Albumin: 3 g/dL — ABNORMAL LOW (ref 3.5–5.0)
Anion gap: 10 (ref 5–15)
BILIRUBIN TOTAL: 1.4 mg/dL — AB (ref 0.3–1.2)
BUN: 31 mg/dL — AB (ref 6–20)
CALCIUM: 9.8 mg/dL (ref 8.9–10.3)
CHLORIDE: 101 mmol/L (ref 101–111)
CO2: 32 mmol/L (ref 22–32)
CREATININE: 1.06 mg/dL — AB (ref 0.44–1.00)
GFR, EST AFRICAN AMERICAN: 51 mL/min — AB (ref >60)
GFR, EST NON AFRICAN AMERICAN: 44 mL/min — AB (ref >60)
Glucose, Bld: 195 mg/dL — ABNORMAL HIGH (ref 65–99)
Potassium: 5.3 mmol/L — ABNORMAL HIGH (ref 3.5–5.1)
Sodium: 143 mmol/L (ref 135–145)
TOTAL PROTEIN: 6.6 g/dL (ref 6.5–8.1)

## 2015-06-08 MED ORDER — ENSURE ENLIVE PO LIQD
237.0000 mL | Freq: Two times a day (BID) | ORAL | Status: DC
  Administered 2015-06-08 – 2015-06-12 (×9): 237 mL via ORAL

## 2015-06-08 MED ORDER — ENSURE ENLIVE PO LIQD
237.0000 mL | Freq: Two times a day (BID) | ORAL | Status: DC
  Administered 2015-06-08: 237 mL via ORAL

## 2015-06-08 MED ORDER — CLINDAMYCIN PHOSPHATE 900 MG/50ML IV SOLN
900.0000 mg | Freq: Three times a day (TID) | INTRAVENOUS | Status: DC
  Administered 2015-06-08 – 2015-06-09 (×4): 900 mg via INTRAVENOUS
  Filled 2015-06-08 (×4): qty 50

## 2015-06-08 NOTE — Progress Notes (Signed)
ANTICOAGULATION CONSULT NOTE - follow up  Pharmacy Consult for Warfarin Indication: atrial fibrillation  Allergies  Allergen Reactions  . Penicillins Anaphylaxis, Hives and Swelling       . Sulfa Antibiotics Anaphylaxis and Swelling    Patient Measurements: Height: 5\' 4"  (162.6 cm) Weight: 161 lb 4.8 oz (73.165 kg) IBW/kg (Calculated) : 54.7  Vital Signs: Temp: 98.1 F (36.7 C) (03/16 0505) Temp Source: Oral (03/16 0505) BP: 165/76 mmHg (03/16 1029) Pulse Rate: 80 (03/16 1029)  Labs:  Recent Labs  06/06/15 0513 06/07/15 0529 06/08/15 0531  HGB 10.3* 10.3* 10.8*  HCT 32.5* 31.8* 33.7*  PLT 192 237 255  LABPROT 25.3* 22.7* 32.0*  INR 2.42* 2.02* 3.19*  CREATININE 1.27* 1.16* 1.06*    Estimated Creatinine Clearance: 32.5 mL/min (by C-G formula based on Cr of 1.06).  Assessment: [age] yoF admitted on 3/7 for pneumonia.  PMH includes HTN, hyperlipidemia, and AFib on chronic warfarin anticoagulation.  PTA Warfarin regimen = 3mg  daily except 2mg  MWF; last reported dose taken on 3/6 @ 21:00. INR upon admission = 1.85 (subtherapeutic). Pharmacy consulted to continue dosing of warfarin.  Today, 06/08/2015  INR supratherapeutic at 3.19 - 2 mg on 3/15  CBC: Hgb low but stable, Plt  WNL  No reported bleeding or complications.  Diet: heart healthy, 60% meal intake charted.  Drug-drug interactions:  Levaquin (started 3/7) likely contributed to elevated INR. This med has now been stopped on 3/13.    Goal of Therapy:  INR 2-3   Plan:   No warfarin tonight due to large INR increase from 2.02 to 3.19 in one day   Daily PT/INR.  Monitor for signs and symptoms of bleeding.    Adalberto ColeNikola Naija Troost, PharmD, BCPS Pager [phone] 06/08/2015 1:29 PM

## 2015-06-08 NOTE — Progress Notes (Signed)
[facility] PROGRESS NOTE  [name] ZOX:[name] DOB: 09/20/1919 DOA: 05/30/2015 PCP: Lorenda PeckOBERTS, RONALD WAYNE, MD  Summary 06/07/15: I have seen and examined [name] at bedside in the presence of her niece and reviewed her chart. Tonya Farley is a pleasant 82 y.o. female with paroxysmal atrial fibrillation on Coumadin who presented to the ED with c/o weakness and 4 day history of cough, nausea, decreased PO intake, generalized weakness and she was Admitted for community acquired pneumonia, and found to have Influenza type A. She continues to have nonproductive cough despite completing course of Levaquin, was not given Tamiflu. Echocardiogram obtained, could not evaluate diastolic function. Wonder if patient has recurrent aspiration pneumonia. Her white count is increasing off antibiotics. We'll therefore obtain CT chest without contrast to better evaluate lung parenchyma, give clindamycin/Tamiflu/give low-dose steroids and reassess. Family says patient once her food liberalized and she does not went to have MBS. They understand that she may be at risk for recurrent aspiration pneumonitis. Will increase Mucinex to 1200 mg twice a day. 06/08/15: CT chest suggests aspiration pneumonitis, "Patchy hazy peripheral opacification and reticulonodular opacification over the upper lobes which may be due to a pneumonia versus atypical infection or inflammatory process. Small bilateral pleural effusions with bibasilar atelectasis. Dependent debris within the left lower lobe bronchus which may be due to aspirated material and raises possibility of aspiration pneumonia". White count has increased to 15,500 and this is concerning. There may be element of steroid induced degranulation. Will increase dose of clindamycin, reduce Solu-Medrol and reassess. Patient feels better today but her appetite remains poor. Plan CAP (community acquired pneumonia)Chest congestion/aspiration pneumonitis/Dyspnea/  Influenza due to identified novel influenza A virus with other respiratory manifestations  Increased dose of Clindamycin to 900 mg  Continue Tamiflu  Reduce dose of Solu-Medrol to 20 mg  Continue Mucinex at 1200 twice a day Paroxysmal Atrial fibrillation (HCC)  Rate controlled  Continue Coumadin per pharmacy target INR 2-3. Appreciate pharmacy help. AKI (acute kidney injury) (HCC)  Likely related to ongoing infection  Improved with hydration. Monitor.  Code Status: DNR Family Communication: Niece at bedside Disposition Plan: Tbd   Consultants:  PCCM  Procedures:    Antibiotics:  Completed Levaquin course  Clindamycin 06/07/2015>>  Tamiflu 06/07/2015>>  HPI/Subjective: Feels somewhat better.  Objective: Filed Vitals:   06/08/15 1029 06/08/15 1444  BP: 165/76 144/74  Pulse: 80 78  Temp:  98.4 F (36.9 C)  Resp: 18 18    Intake/Output Summary (Last 24 hours) at 06/08/15 1454 Last data filed at 06/08/15 1320  Gross per 24 hour  Intake    280 ml  Output    250 ml  Net     30 ml   Filed Weights   06/06/15 0440 06/07/15 0550 06/08/15 0505  Weight: 72.4 kg (159 lb 9.8 oz) 71.5 kg (157 lb 10.1 oz) 73.165 kg (161 lb 4.8 oz)    Exam:   General:  Comfortable at rest.  Cardiovascular: S1-S2 normal. No murmurs. Pulse regular.  Respiratory: Good air entry bilaterally. No rhonchi or rales.  Abdomen: Soft and nontender. Normal bowel sounds. No organomegaly.  Musculoskeletal: No pedal edema   Neurological: Intact  Data Reviewed: Basic Metabolic Panel:  Recent Labs Lab 06/02/15 0432  06/04/15 0620 06/05/15 0453 06/06/15 0513 06/07/15 0529 06/08/15 0531  NA 144  < > 141 141 137 142 143  K 4.0  < > 4.1 4.3 4.4 4.6 5.3*  CL 108  < > 105 105 102  102 101  CO2 21*  < > 32 32  GLUCOSE 120*  < > 138* 128* 137* 113* 195*  BUN 19  < > 29* 32* 34* 29* 31*  CREATININE 1.11*  < > 1.21* 1.21* 1.27* 1.16* 1.06*  CALCIUM 8.1*  < > 9.2 9.7  9.2 9.5 9.8  MG 2.5*  --   --   --   --   --   --   < > = values in this interval not displayed. Liver Function Tests:  Recent Labs Lab 06/08/15 0531  AST 21  ALT 10*  ALKPHOS 48  BILITOT 1.4*  PROT 6.6  ALBUMIN 3.0*   No results for input(s): LIPASE, AMYLASE in the last 168 hours. No results for input(s): AMMONIA in the last 168 hours. CBC:  Recent Labs Lab 06/04/15 0620 06/05/15 0453 06/06/15 0513 06/07/15 0529 06/08/15 0531  WBC 10.2 10.7* 11.0* 12.5* 15.2*  NEUTROABS  --   --   --   --  13.8*  HGB 10.6* 10.7* 10.3* 10.3* 10.8*  HCT 31.9* 33.6* 32.5* 31.8* 33.7*  MCV 81.8 85.9 86.9 82.6 86.2  PLT 140* 177 192 237 255   Cardiac Enzymes: No results for input(s): CKTOTAL, CKMB, CKMBINDEX, TROPONINI in the last 168 hours. BNP (last 3 results)  Recent Labs  01/10/15 2016 05/30/15 1852 06/03/15 1611  BNP 382.1* 212.3* 358.1*    ProBNP (last 3 results) No results for input(s): PROBNP in the last 8760 hours.  CBG: No results for input(s): GLUCAP in the last 168 hours.  Recent Results (from the past 240 hour(s))  Culture, blood (routine x 2) Call MD if unable to obtain prior to antibiotics being given     Status: None   Collection Time: 05/31/15 12:14 AM  Result Value Ref Range Status   Specimen Description BLOOD LEFT ANTECUBITAL  Final   Special Requests BOTTLES DRAWN AEROBIC ONLY 5CC  Final   Culture   Final    NO GROWTH 5 DAYS Performed at [facility]    Report Status 06/05/2015 FINAL  Final  Culture, blood (routine x 2) Call MD if unable to obtain prior to antibiotics being given     Status: None   Collection Time: 05/31/15 12:14 AM  Result Value Ref Range Status   Specimen Description BLOOD LEFT HAND  Final   Special Requests BOTTLES DRAWN AEROBIC ONLY 5CC  Final   Culture   Final    NO GROWTH 5 DAYS Performed at [facility]    Report Status 06/05/2015 FINAL  Final     Studies: Ct Chest Wo Contrast  06/07/2015  CLINICAL DATA:   Weakness with four-day history of cough, nausea and decreased PO intake. Admitted for pneumonia and found to have influenza type A. EXAM: CT CHEST WITHOUT CONTRAST TECHNIQUE: Multidetector CT imaging of the chest was performed following the standard protocol without IV contrast. COMPARISON:  Chest x-ray 06/05/2015 and 05/29/2015 FINDINGS: Lungs are adequately inflated and demonstrate small bilateral pleural effusions right greater than left with associated atelectasis. There is mild patchy peripheral opacification and reticulonodular opacification over the upper lobes bilaterally right worse than left which may be due to a pneumonia versus atypical infection or inflammatory process. Suggestion of dependent debris within the left lower lobe bronchus possibly from aspiration. Small focal density along the left lateral wall of the trachea possibly aspirated material. There is mild cardiomegaly. There is minimal calcification over the 8 mitral valve annulus. There is  mild calcified plaque over the left main, left anterior descending and right coronary arteries. Mild calcified plaque over the thoracic aorta. 1 cm precarinal lymph node likely reactive. No significant hilar or axillary adenopathy. Images through the upper abdomen demonstrate minimal prominence of the left intrarenal collecting system versus parapelvic cysts. There are moderate degenerative changes of the spine. Evidence of previous kyphoplasty of a severe L1 compression fracture. The L1 compression fracture causes compression of the anterior aspect of T12 unchanged. IMPRESSION: Patchy hazy peripheral opacification and reticulonodular opacification over the upper lobes which may be due to a pneumonia versus atypical infection or inflammatory process. Small bilateral pleural effusions with bibasilar atelectasis. Dependent debris within the left lower lobe bronchus which may be due to aspirated material and raises possibility of aspiration pneumonia.  Recommend followup CT 3-4 weeks. Mild cardiomegaly.  Atherosclerotic coronary artery disease. Stable compression fracture post kyphoplasty of L1 with adjacent anterior compression deformity of T12 unchanged. Possible mild dilatation of the left intrarenal collecting system versus parapelvic renal cyst. Electronically Signed   By: Elberta Fortis M.D.   On: 06/07/2015 17:21    Scheduled Meds: . albuterol  2.5 mg Nebulization TID  . antiseptic oral rinse  7 mL Mouth Rinse BID  . atorvastatin  10 mg Oral Daily  . benzonatate  200 mg Oral BID  . brimonidine  1 drop Both Eyes TID  . clindamycin (CLEOCIN) IV  900 mg Intravenous Q8H  . dextromethorphan-guaiFENesin  2 tablet Oral BID  . diltiazem  360 mg Oral Daily  . feeding supplement (ENSURE ENLIVE)  237 mL Oral BID BM  . furosemide  40 mg Oral Daily  . labetalol  200 mg Oral BID  . latanoprost  1 drop Both Eyes QHS  . levothyroxine  100 mcg Oral QAC breakfast  . methylPREDNISolone (SOLU-MEDROL) injection  20 mg Intravenous Daily  . oseltamivir  30 mg Oral BID  . pantoprazole  40 mg Oral Daily  . timolol  1 drop Both Eyes q morning - 10a  . Warfarin - Pharmacist Dosing Inpatient   Does not apply q1800   Continuous Infusions:    Time spent: 25 minutes    Druscilla Petsch  [facility] Pager [phone]. If 7PM-7AM, please contact night-coverage at [email or web address], password [facility] 06/08/2015, 2:54 PM  LOS: 9 days

## 2015-06-08 NOTE — Progress Notes (Addendum)
Nutrition Follow-up  DOCUMENTATION CODES:   Not applicable  INTERVENTION:  - Continue Regular diet, or diet per SLP recommendation - Continue to encourage pt with PO intakes during meal times - Will order snack for 1400 daily. - RD will continue to monitor for additional needs  NUTRITION DIAGNOSIS:   Inadequate oral intake related to acute illness, nausea, vomiting as evidenced by per patient/family report. -improving  GOAL:   Patient will meet greater than or equal to 90% of their needs -variably, minimally met  MONITOR:   PO intake, Skin, Weight trends, Labs, I & O's  ASSESSMENT:   82 y.o. female who presents to the ED with c/o weakness. She has had 4 day history of cough, nausea, decreased PO intake, generalized weakness. All of this onset late last week as a URI which then moved down into her chest. She saw her PCP yesterday who did a CXR (which is available on our Epic system) which demonstrated BLL PNA), labs were drawn this morning which showed dehydration with K of 2.5, elevated BUN. Patient was sent to the ED. Daughter gave patient Gatorade in the mean time.  3/16 Per chart review, pt ate 40% of breakfast 3/13 and 60% of breakfast this AM with no other intakes documented since previous assessment. Pt reports that appetite is somewhat improved today and that she ate "almost all of" eggs and bacon for breakfast. Pt denies any chewing or swallowing issues during meals. SLP note from this AM indicates pt has a hx of mild esophageal dysmotility and that pt may benefit from esophagram. Plan previously for MBS but note 3/14 at 1827 indicates that MBS not be done.   Pt continues to decline nutrition supplements or additional nutrition intervention. Pt may benefit from a snack between meals; will order. Likely meeting minimal needs on a variable basis. Medications reviewed. Labs reviewed; K: 5.3 mmol/L, BUN: 31 mg/dL, creatinine elevated but trending down, GFR:  44.  ADDENDUM: Ensure Enlive ordered TID PRN since last RD assessment. Pt had previously requested this item not be provided to her.    3/13 - Physical assessment shows no muscle or fat wasting, mild edema present.  - Per chart review, pt eating 0-50% of meals since previous assessment. - Pt denies N/V or abdominal pain at this time.  - She states that she continues with a fair appetite.  - New consult received for pt who is lactose intolerant.  - Pt states that this is accurate.  - She states that Ensure is okay and does not cause GI issues for her (it is a lactose-free item) but that it tastes like medicine and she would prefer to no longer receive it.  - She states that staff is often encouraging her to eat and drink more at meals and between meals.  - Discussed other nutrition supplement options with pt and she declines at this time.    3/8 - Per chart review, pt ate 10% of breakfast this AM. - Pt reports that she continues to feel nauseated but denies emesis this AM.  - Chart review indicates last episode of emesis was yesterday (3/7) at 2341.  - Pt states she began feeling nausea with associated weakness and decreased appetite on Friday (05/26/15).  - She states further decline since that time and that she went to PCP on Monday (3/6) and came to the hospital yesterday (3/7).  - Pt requests that physical assessment not be done at this time due to nausea and discomfort.  -  Per chart review, pt has lost 4 lbs (3% body weight) in the past 5 months which is not significant for time frame.  - Unable to state malnutrition at this time but will update following physical assessment.   Diet Order:  Diet regular Room service appropriate?: Yes; Fluid consistency:: Thin  Skin:  Wound (see comment) (Bilateral buttocks MSAD)  Last BM:  3/16  Height:   Ht Readings from Last 1 Encounters:  05/30/15 '5\' 4"'  (1.626 m)    Weight:   Wt Readings from Last 1 Encounters:  06/08/15 161 lb  4.8 oz (73.165 kg)    Ideal Body Weight:  54.54 kg (kg)  BMI:  Body mass index is 27.67 kg/(m^2).  Estimated Nutritional Needs:   Kcal:  1350-1550  Protein:  55-65 grams  Fluid:  1.8-2 L/day  EDUCATION NEEDS:   No education needs identified at this time    Jarome Matin, RD, LDN Inpatient Clinical Dietitian Pager # [phone] After hours/weekend pager # [phone]

## 2015-06-08 NOTE — Care Management Note (Signed)
Case Management Note  Patient Details  Name: Tonya Farley MRN: [id number] Date of Birth: 07/12/1919  Subjective/Objective: PNA.iv abx.                   Action/Plan:d/c plan SNF.   Expected Discharge Date:                  Expected Discharge Plan:  Skilled Nursing Facility  In-House Referral:  Clinical Social Work  Discharge planning Services  CM Consult  Post Acute Care Choice:    Choice offered to:     DME Arranged:    DME Agency:     HH Arranged:    HH Agency:     Status of Service:  In process, will continue to follow  Medicare Important Message Given:  Yes Date Medicare IM Given:    Medicare IM give by:    Date Additional Medicare IM Given:    Additional Medicare Important Message give by:     If discussed at Long Length of Stay Meetings, dates discussed:    Additional Comments:  Tonya Farley, Jaymi Tinner, RN 06/08/2015, 1:57 PM

## 2015-06-09 LAB — CBC WITH DIFFERENTIAL/PLATELET
Basophils Absolute: 0 10*3/uL (ref 0.0–0.1)
Basophils Relative: 0 %
EOS PCT: 0 %
Eosinophils Absolute: 0 10*3/uL (ref 0.0–0.7)
HEMATOCRIT: 31.7 % — AB (ref 36.0–46.0)
Hemoglobin: 10.3 g/dL — ABNORMAL LOW (ref 12.0–15.0)
LYMPHS ABS: 0.9 10*3/uL (ref 0.7–4.0)
LYMPHS PCT: 6 %
MCH: 26.9 pg (ref 26.0–34.0)
MCHC: 32.5 g/dL (ref 30.0–36.0)
MCV: 82.8 fL (ref 78.0–100.0)
Monocytes Absolute: 1.4 10*3/uL — ABNORMAL HIGH (ref 0.1–1.0)
Monocytes Relative: 9 %
Neutro Abs: 13.3 10*3/uL — ABNORMAL HIGH (ref 1.7–7.7)
Neutrophils Relative %: 85 %
PLATELETS: 249 10*3/uL (ref 150–400)
RBC: 3.83 MIL/uL — AB (ref 3.87–5.11)
RDW: 17 % — ABNORMAL HIGH (ref 11.5–15.5)
WBC: 15.6 10*3/uL — AB (ref 4.0–10.5)

## 2015-06-09 LAB — URINALYSIS, ROUTINE W REFLEX MICROSCOPIC
BILIRUBIN URINE: NEGATIVE
Glucose, UA: NEGATIVE mg/dL
HGB URINE DIPSTICK: NEGATIVE
KETONES UR: NEGATIVE mg/dL
NITRITE: NEGATIVE
PH: 5.5 (ref 5.0–8.0)
Protein, ur: NEGATIVE mg/dL
Specific Gravity, Urine: 1.019 (ref 1.005–1.030)

## 2015-06-09 LAB — COMPREHENSIVE METABOLIC PANEL
ALK PHOS: 43 U/L (ref 38–126)
ALT: 14 U/L (ref 14–54)
AST: 12 U/L — ABNORMAL LOW (ref 15–41)
Albumin: 2.8 g/dL — ABNORMAL LOW (ref 3.5–5.0)
Anion gap: 11 (ref 5–15)
BUN: 40 mg/dL — AB (ref 6–20)
CALCIUM: 9.4 mg/dL (ref 8.9–10.3)
CO2: 31 mmol/L (ref 22–32)
CREATININE: 1.21 mg/dL — AB (ref 0.44–1.00)
Chloride: 100 mmol/L — ABNORMAL LOW (ref 101–111)
GFR, EST AFRICAN AMERICAN: 43 mL/min — AB (ref >60)
GFR, EST NON AFRICAN AMERICAN: 37 mL/min — AB (ref >60)
Glucose, Bld: 179 mg/dL — ABNORMAL HIGH (ref 65–99)
Potassium: 4.4 mmol/L (ref 3.5–5.1)
Sodium: 142 mmol/L (ref 135–145)
Total Bilirubin: 0.5 mg/dL (ref 0.3–1.2)
Total Protein: 6.2 g/dL — ABNORMAL LOW (ref 6.5–8.1)

## 2015-06-09 LAB — URINE MICROSCOPIC-ADD ON: RBC / HPF: NONE SEEN RBC/hpf (ref 0–5)

## 2015-06-09 LAB — PROTIME-INR
INR: 4.66 — ABNORMAL HIGH (ref 0.00–1.49)
Prothrombin Time: 42.7 seconds — ABNORMAL HIGH (ref 11.6–15.2)

## 2015-06-09 MED ORDER — DEXTROSE-NACL 5-0.9 % IV SOLN
INTRAVENOUS | Status: AC
  Administered 2015-06-09 – 2015-06-10 (×2): via INTRAVENOUS

## 2015-06-09 MED ORDER — VANCOMYCIN HCL IN DEXTROSE 1-5 GM/200ML-% IV SOLN
1000.0000 mg | INTRAVENOUS | Status: DC
  Administered 2015-06-09 – 2015-06-11 (×3): 1000 mg via INTRAVENOUS
  Filled 2015-06-09 (×3): qty 200

## 2015-06-09 MED ORDER — FUROSEMIDE 20 MG PO TABS
20.0000 mg | ORAL_TABLET | Freq: Every day | ORAL | Status: DC
  Administered 2015-06-10 – 2015-06-13 (×4): 20 mg via ORAL
  Filled 2015-06-09 (×4): qty 1

## 2015-06-09 MED ORDER — HYDROCOD POLST-CPM POLST ER 10-8 MG/5ML PO SUER
5.0000 mL | Freq: Two times a day (BID) | ORAL | Status: DC
  Administered 2015-06-09 – 2015-06-12 (×6): 5 mL via ORAL
  Filled 2015-06-09 (×6): qty 5

## 2015-06-09 MED ORDER — CLINDAMYCIN PHOSPHATE 600 MG/50ML IV SOLN
600.0000 mg | Freq: Three times a day (TID) | INTRAVENOUS | Status: DC
  Administered 2015-06-10 – 2015-06-11 (×5): 600 mg via INTRAVENOUS
  Filled 2015-06-09 (×6): qty 50

## 2015-06-09 NOTE — Progress Notes (Signed)
[facility] PROGRESS NOTE  KABREA SEENEY ZOX:[id number] DOB: [birth year] DOA: 05/30/2015 PCP: Lorenda Peck, MD  Summary 06/07/15: I have seen and examined [name] at bedside in the presence of her niece and reviewed her chart. Tonya Farley is a pleasant 82 y.o. female with paroxysmal atrial fibrillation on Coumadin who presented to the ED with c/o weakness and 4 day history of cough, nausea, decreased PO intake, generalized weakness and she was Admitted for community acquired pneumonia, and found to have Influenza type A. She continues to have nonproductive cough despite completing course of Levaquin, was not given Tamiflu. Echocardiogram obtained, could not evaluate diastolic function. Wonder if patient has recurrent aspiration pneumonia. Her white count is increasing off antibiotics. We'll therefore obtain CT chest without contrast to better evaluate lung parenchyma, give clindamycin/Tamiflu/give low-dose steroids and reassess. Family says patient once her food liberalized and she does not went to have MBS. They understand that she may be at risk for recurrent aspiration pneumonitis. Will increase Mucinex to 1200 mg twice a day. 06/08/15: CT chest suggests aspiration pneumonitis, "Patchy hazy peripheral opacification and reticulonodular opacification over the upper lobes which may be due to a pneumonia versus atypical infection or inflammatory process. Small bilateral pleural effusions with bibasilar atelectasis. Dependent debris within the left lower lobe bronchus which may be due to aspirated material and raises possibility of aspiration pneumonia". White count has increased to 15,500 and this is concerning. There may be element of steroid induced degranulation. Will increase dose of clindamycin, reduce Solu-Medrol and reassess. Patient feels better today but her appetite remains poor. 06/09/15: Patient ill looking with increasing leukocytosis, persistent cough. Her renal function  is also worse. 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 I discussed the plan of care with patient's niece at the bedside. Plan CAP (community acquired pneumonia)Chest congestion/aspiration pneumonitis/Dyspnea/ Influenza due to identified novel influenza A virus with other respiratory manifestations  Change dose of Clindamycin to 600 mg  Continue Tamiflu  Vancomycin  Discontinue Solu-Medrol to 20 mg  Tussionex Paroxysmal Atrial fibrillation (HCC)  Rate controlled  Continue Coumadin per pharmacy target INR 2-3. Appreciate pharmacy help. AKI (acute kidney injury) (HCC)  Likely related to ongoing infection  Gentle fluids  Reduce dose of Lasix  Code Status: DNR Family Communication: Niece at bedside Disposition Plan: Tbd   Consultants:  PCCM  Procedures:    Antibiotics:  Completed Levaquin course  Clindamycin 06/07/2015>>  Tamiflu 06/07/2015>>  Vancomycin 06/09/2015>>  HPI/Subjective: Feels weak.  Objective: Filed Vitals:   06/09/15 1028 06/09/15 1315  BP: 153/81 148/62  Pulse: 99 118  Temp:  97.5 F (36.4 C)  Resp:  24    Intake/Output Summary (Last 24 hours) at 06/09/15 1949 Last data filed at 06/09/15 1855  Gross per 24 hour  Intake 1358.33 ml  Output    201 ml  Net 1157.33 ml   Filed Weights   06/07/15 0550 06/08/15 0505 06/09/15 0500  Weight: 71.5 kg (157 lb 10.1 oz) 73.165 kg (161 lb 4.8 oz) 72.8 kg (160 lb 7.9 oz)    Exam:   General:  Coughing  Cardiovascular: S1-S2 normal. No murmurs. Pulse regular.  Respiratory: Good air entry bilaterally. No rhonchi or rales. Coughing.  Abdomen: Soft and nontender. Normal bowel sounds. No organomegaly.  Musculoskeletal: No pedal edema   Neurological: Intact  Data Reviewed: Basic Metabolic Panel:  Recent Labs  Lab 06/05/15 0453 06/06/15 0513  06/07/15 0529 06/08/15 0531 06/09/15 0509  NA 141 137 142 143 142  K 4.3 4.4 4.6 5.3* 4.4  CL 105 102 102 101 100*  CO2 27 28 32 32 31  GLUCOSE 128* 137* 113* 195* 179*  BUN 32* 34* 29* 31* 40*  CREATININE 1.21* 1.27* 1.16* 1.06* 1.21*  CALCIUM 9.7 9.2 9.5 9.8 9.4   Liver Function Tests:  Recent Labs Lab 06/08/15 0531 06/09/15 0509  AST 21 12*  ALT 10* 14  ALKPHOS 48 43  BILITOT 1.4* 0.5  PROT 6.6 6.2*  ALBUMIN 3.0* 2.8*   No results for input(s): LIPASE, AMYLASE in the last 168 hours. No results for input(s): AMMONIA in the last 168 hours. CBC:  Recent Labs Lab 06/05/15 0453 06/06/15 0513 06/07/15 0529 06/08/15 0531 06/09/15 0509  WBC 10.7* 11.0* 12.5* 15.2* 15.6*  NEUTROABS  --   --   --  13.8* 13.3*  HGB 10.7* 10.3* 10.3* 10.8* 10.3*  HCT 33.6* 32.5* 31.8* 33.7* 31.7*  MCV 85.9 86.9 82.6 86.2 82.8  PLT 177 192 237 255 249   Cardiac Enzymes: No results for input(s): CKTOTAL, CKMB, CKMBINDEX, TROPONINI in the last 168 hours. BNP (last 3 results)  Recent Labs  01/10/15 2016 05/30/15 1852 06/03/15 1611  BNP 382.1* 212.3* 358.1*    ProBNP (last 3 results) No results for input(s): PROBNP in the last 8760 hours.  CBG: No results for input(s): GLUCAP in the last 168 hours.  Recent Results (from the past 240 hour(s))  Culture, blood (routine x 2) Call MD if unable to obtain prior to antibiotics being given     Status: None   Collection Time: 05/31/15 12:14 AM  Result Value Ref Range Status   Specimen Description BLOOD LEFT ANTECUBITAL  Final   Special Requests BOTTLES DRAWN AEROBIC ONLY 5CC  Final   Culture   Final    NO GROWTH 5 DAYS Performed at [facility]    Report Status 06/05/2015 FINAL  Final  Culture, blood (routine x 2) Call MD if unable to obtain prior to antibiotics being given     Status: None   Collection Time: 05/31/15 12:14 AM  Result Value Ref Range Status   Specimen Description BLOOD LEFT HAND  Final   Special Requests  BOTTLES DRAWN AEROBIC ONLY 5CC  Final   Culture   Final    NO GROWTH 5 DAYS Performed at [facility]    Report Status 06/05/2015 FINAL  Final     Studies: No results found.  Scheduled Meds: . albuterol  2.5 mg Nebulization TID  . antiseptic oral rinse  7 mL Mouth Rinse BID  . atorvastatin  10 mg Oral Daily  . benzonatate  200 mg Oral BID  . brimonidine  1 drop Both Eyes TID  . [START ON 06/10/2015] clindamycin (CLEOCIN) IV  600 mg Intravenous Q8H  . dextromethorphan-guaiFENesin  2 tablet Oral BID  . diltiazem  360 mg Oral Daily  . feeding supplement (ENSURE ENLIVE)  237 mL Oral BID BM  . [START ON 06/10/2015] furosemide  20 mg Oral Daily  . labetalol  200 mg Oral BID  . latanoprost  1 drop Both Eyes QHS  . levothyroxine  100 mcg Oral QAC breakfast  . oseltamivir  30 mg Oral BID  . pantoprazole  40 mg Oral Daily  . timolol  1 drop Both Eyes q morning - 10a  . vancomycin  1,000 mg Intravenous Q24H  .  Warfarin - Pharmacist Dosing Inpatient   Does not apply q1800   Continuous Infusions: . dextrose 5 % and 0.9% NaCl 50 mL/hr at 06/09/15 1821     Time spent: 25 minutes    Codi Folkerts  [facility] Pager [phone]. If 7PM-7AM, please contact night-coverage at [email or web address], password [email or web address] 06/09/2015, 7:49 PM  LOS: 10 days

## 2015-06-09 NOTE — Progress Notes (Signed)
ANTICOAGULATION CONSULT NOTE - Follow up  Pharmacy Consult for Warfarin Indication: atrial fibrillation  Allergies  Allergen Reactions  . Penicillins Anaphylaxis, Hives and Swelling       . Sulfa Antibiotics Anaphylaxis and Swelling    Patient Measurements: Height: 5\' 4"  (162.6 cm) Weight: 160 lb 7.9 oz (72.8 kg) IBW/kg (Calculated) : 54.7  Vital Signs: Temp: 98.3 F (36.8 C) (03/17 0500) Temp Source: Oral (03/17 0500) BP: 156/72 mmHg (03/17 0500) Pulse Rate: 80 (03/17 0500)  Labs:  Recent Labs  06/07/15 0529 06/08/15 0531 06/09/15 0509  HGB 10.3* 10.8* 10.3*  HCT 31.8* 33.7* 31.7*  PLT 237 255 249  LABPROT 22.7* 32.0* 42.7*  INR 2.02* 3.19* 4.66*  CREATININE 1.16* 1.06* 1.21*    Estimated Creatinine Clearance: 28.4 mL/min (by C-G formula based on Cr of 1.21).  Assessment: [age] yoF admitted on 3/7 for pneumonia.  PMH includes HTN, hyperlipidemia, and AFib on chronic warfarin anticoagulation.  PTA Warfarin regimen = 3mg  daily except 2mg  MWF; last reported dose taken on 3/6 @ 21:00. INR upon admission = 1.85 (subtherapeutic). Pharmacy consulted to continue dosing of warfarin.  Today, 06/09/2015:  INR supratherapeutic and continues to rise despite only small doses given.    CBC: Hgb low but stable, Plt  WNL  No reported bleeding or complications.  Diet: heart healthy, 60% meal intake charted.  Drug-drug interactions: Levaquin(from 3/6-3/14) likely contributed to elevated INR.     Goal of Therapy:  INR 2-3   Plan:   No warfarin tonight.    Daily PT/INR.  Monitor for signs and symptoms of bleeding.  Charolotte Ekeom Kylle Lall, PharmD, pager [phone]. 06/09/2015,8:46 AM.

## 2015-06-09 NOTE — Progress Notes (Signed)
Pharmacy Antibiotic Note  Tonya MoatsMamie N Farley is a 82 y.o. female admitted on 05/30/2015 with pneumonia. Received Levaquin 3/7-3/14 then started on Clindamycin and Tamiflu on 3/16. CT showed persistent pneumonia with possible aspiration. Pharmacy has been consulted for Vancomycin dosing. SCr elevated, CrCl ~28CG, 32N.  Plan: Vancomycin 1g IV every 24 hours.  Goal trough 15-20 mcg/mL.  Measure Vanc trough at steady state. Follow up renal fxn, culture results, and clinical course.   Height: 5\' 4"  (162.6 cm) Weight: 160 lb 7.9 oz (72.8 kg) IBW/kg (Calculated) : 54.7  Temp (24hrs), Avg:98.4 F (36.9 C), Min:98.3 F (36.8 C), Max:98.5 F (36.9 C)   Recent Labs Lab 06/05/15 0453 06/06/15 0513 06/07/15 0529 06/08/15 0531 06/09/15 0509  WBC 10.7* 11.0* 12.5* 15.2* 15.6*  CREATININE 1.21* 1.27* 1.16* 1.06* 1.21*    Estimated Creatinine Clearance: 28.4 mL/min (by C-G formula based on Cr of 1.21).    Allergies  Allergen Reactions  . Penicillins Anaphylaxis, Hives and Swelling       . Sulfa Antibiotics Anaphylaxis and Swelling   Antimicrobials this admission: Levaquin 3/7 >> 3/14 Clinda 3/15 >> (3/20) Tamiflu 3/15 >> (3/20) Vanc 3/17 >>   Dose adjustments this admission:  Microbiology results: 3/7 Strep pneumo Ur Ag: neg 3/8 BCx: neg 3/8 Influenza PCR: A+  Thank you for allowing pharmacy to be a part of this patient's care.  Charolotte Ekeom Demaryius Imran, PharmD, pager [phone]. 06/09/2015,9:47 AM.

## 2015-06-09 NOTE — Progress Notes (Signed)
Physical Therapy Treatment Patient Details Name: Driscilla MoatsMamie N Stayer MRN: [id number] DOB: 01/03/1920 Today's Date: 06/09/2015    History of Present Illness 82 yo female admitted with Pna. Hx of HTN, chronic back pain, A fib.     PT Comments    [facility].  Assisted with hygiene.  Amb a limited distance then positioned in recliner.   Follow Up Recommendations  SNF     Equipment Recommendations       Recommendations for Other Services       Precautions / Restrictions Precautions Precautions: Fall Precaution Comments: monitor stats Restrictions Weight Bearing Restrictions: No    Mobility  Bed Mobility               General bed mobility comments: pt OOB in recliner  Transfers Overall transfer level: Needs assistance Equipment used: Rolling walker (2 wheeled) Transfers: Sit to/from Stand Sit to Stand: Min assist         General transfer comment: Assist to rise, stabilize, control descent. [facility]. VCs safety, technique, hand placement. incont urine  Ambulation/Gait   Ambulation Distance (Feet): 11 Feet Assistive device: Rolling walker (2 wheeled) Gait Pattern/deviations: Step-to pattern;Trunk flexed;Shuffle Gait velocity: decreased   General Gait Details: + 2 for safety such that recliner was following.  LImited amb distance and max c/o fatigue.  RA avg 94% with HR 124 during gait.   Stairs            Wheelchair Mobility    Modified Rankin (Stroke Patients Only)       Balance                                    Cognition Arousal/Alertness: Awake/alert Behavior During Therapy: WFL for tasks assessed/performed Overall Cognitive Status: Within Functional Limits for tasks assessed                      Exercises      General Comments        Pertinent Vitals/Pain Pain Assessment: No/denies pain    Home Living                      Prior Function             PT Goals (current goals can now be found in the care plan section) Progress towards PT goals: Progressing toward goals    Frequency  Min 3X/week    PT Plan Current plan remains appropriate    Co-evaluation             End of Session Equipment Utilized During Treatment: Gait belt Activity Tolerance: Patient tolerated treatment well Patient left: in chair;with call bell/phone within reach;with chair alarm set     Time: 0945-1010 PT Time Calculation (min) (ACUTE ONLY): 25 min  Charges:  $Gait Training: 8-22 mins $Therapeutic Activity: 8-22 mins                    G Codes:      Felecia ShellingLori Othmar Ringer  PTA [facility]  Acute  Rehab Pager      [phone]

## 2015-06-10 ENCOUNTER — Inpatient Hospital Stay (HOSPITAL_COMMUNITY): Payer: Medicare Other | Admitting: Certified Registered Nurse Anesthetist

## 2015-06-10 LAB — PROTIME-INR
INR: 4.23 — AB (ref 0.00–1.49)
Prothrombin Time: 39.6 seconds — ABNORMAL HIGH (ref 11.6–15.2)

## 2015-06-10 LAB — CBC WITH DIFFERENTIAL/PLATELET
BASOS ABS: 0 10*3/uL (ref 0.0–0.1)
BASOS PCT: 0 %
EOS ABS: 0 10*3/uL (ref 0.0–0.7)
EOS PCT: 0 %
HCT: 33 % — ABNORMAL LOW (ref 36.0–46.0)
HEMOGLOBIN: 10.3 g/dL — AB (ref 12.0–15.0)
Lymphocytes Relative: 10 %
Lymphs Abs: 1.4 10*3/uL (ref 0.7–4.0)
MCH: 27.3 pg (ref 26.0–34.0)
MCHC: 31.2 g/dL (ref 30.0–36.0)
MCV: 87.5 fL (ref 78.0–100.0)
Monocytes Absolute: 1 10*3/uL (ref 0.1–1.0)
Monocytes Relative: 7 %
NEUTROS PCT: 83 %
Neutro Abs: 11.8 10*3/uL — ABNORMAL HIGH (ref 1.7–7.7)
PLATELETS: 246 10*3/uL (ref 150–400)
RBC: 3.77 MIL/uL — AB (ref 3.87–5.11)
RDW: 17.4 % — ABNORMAL HIGH (ref 11.5–15.5)
WBC: 14.2 10*3/uL — AB (ref 4.0–10.5)

## 2015-06-10 LAB — COMPREHENSIVE METABOLIC PANEL
ALBUMIN: 2.5 g/dL — AB (ref 3.5–5.0)
ALT: 13 U/L — AB (ref 14–54)
AST: 11 U/L — AB (ref 15–41)
Alkaline Phosphatase: 43 U/L (ref 38–126)
Anion gap: 9 (ref 5–15)
BUN: 40 mg/dL — AB (ref 6–20)
CHLORIDE: 99 mmol/L — AB (ref 101–111)
CO2: 34 mmol/L — AB (ref 22–32)
CREATININE: 1.18 mg/dL — AB (ref 0.44–1.00)
Calcium: 9 mg/dL (ref 8.9–10.3)
GFR calc non Af Amer: 39 mL/min — ABNORMAL LOW (ref >60)
GFR, EST AFRICAN AMERICAN: 45 mL/min — AB (ref >60)
GLUCOSE: 108 mg/dL — AB (ref 65–99)
Potassium: 4.1 mmol/L (ref 3.5–5.1)
SODIUM: 142 mmol/L (ref 135–145)
Total Bilirubin: 0.8 mg/dL (ref 0.3–1.2)
Total Protein: 5.6 g/dL — ABNORMAL LOW (ref 6.5–8.1)

## 2015-06-10 LAB — URINE CULTURE

## 2015-06-10 LAB — TSH: TSH: 1.325 u[IU]/mL (ref 0.350–4.500)

## 2015-06-10 MED ORDER — PREDNISONE 20 MG PO TABS
30.0000 mg | ORAL_TABLET | Freq: Every day | ORAL | Status: DC
  Administered 2015-06-11 – 2015-06-12 (×2): 30 mg via ORAL
  Filled 2015-06-10 (×2): qty 1

## 2015-06-10 MED ORDER — ADULT MULTIVITAMIN W/MINERALS CH
1.0000 | ORAL_TABLET | Freq: Every day | ORAL | Status: DC
  Administered 2015-06-10 – 2015-06-18 (×9): 1 via ORAL
  Filled 2015-06-10 (×10): qty 1

## 2015-06-10 MED ORDER — PREDNISONE 20 MG PO TABS
40.0000 mg | ORAL_TABLET | Freq: Once | ORAL | Status: AC
  Administered 2015-06-10: 40 mg via ORAL
  Filled 2015-06-10: qty 2

## 2015-06-10 MED ORDER — BENZONATATE 100 MG PO CAPS
200.0000 mg | ORAL_CAPSULE | Freq: Three times a day (TID) | ORAL | Status: DC | PRN
  Administered 2015-06-13 – 2015-06-18 (×2): 200 mg via ORAL
  Filled 2015-06-10 (×2): qty 2

## 2015-06-10 NOTE — Progress Notes (Signed)
Spoke with the niece, Darel HongJudy Lopp via telephone. She is aware of PICC request. 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  Request to hold PICC for about an hour.

## 2015-06-10 NOTE — Progress Notes (Signed)
Spoke with niece, Darel HongJudy Farley via telephone.  States she prefers not to have PICC placed since anesthesia was able to place PIV.  Then stated she would let the doctor decide that and that the doctor would need to call her personally to discuss that need.  Dr. Venetia Constableanga notified of conversation and PIV.  New order to d/c PICC order at this time.

## 2015-06-10 NOTE — Progress Notes (Signed)
ANTICOAGULATION CONSULT NOTE - Follow up  Pharmacy Consult for Warfarin Indication: atrial fibrillation  Allergies  Allergen Reactions  . Penicillins Anaphylaxis, Hives and Swelling       . Sulfa Antibiotics Anaphylaxis and Swelling    Patient Measurements: Height: 5\' 4"  (162.6 cm) Weight: 162 lb 8 oz (73.71 kg) IBW/kg (Calculated) : 54.7  Vital Signs: Temp: 98 F (36.7 C) (03/18 0422) Temp Source: Oral (03/18 0422) BP: 115/46 mmHg (03/18 0422) Pulse Rate: 68 (03/18 0422)  Labs:  Recent Labs  06/08/15 0531 06/09/15 0509 06/10/15 0608  HGB 10.8* 10.3* 10.3*  HCT 33.7* 31.7* 33.0*  PLT 255 249 246  LABPROT 32.0* 42.7* 39.6*  INR 3.19* 4.66* 4.23*  CREATININE 1.06* 1.21* 1.18*    Estimated Creatinine Clearance: 29.3 mL/min (by C-G formula based on Cr of 1.18).  Assessment: [age] yoF admitted on 3/7 for pneumonia.  PMH includes HTN, hyperlipidemia, and AFib on chronic warfarin anticoagulation.  PTA Warfarin regimen = 3mg  daily except 2mg  MWF; last reported dose taken on 3/6 @ 21:00. INR upon admission = 1.85 (subtherapeutic). Pharmacy consulted to continue dosing of warfarin.  Today, 06/10/2015:  INR supratherapeutic but starting to come down now.     CBC: Hgb low but stable, Plt WNL  No reported bleeding or complications.  Diet: heart healthy, 60% meal intake charted.  Drug-drug interactions: Levaquin(from 3/6-3/14) likely contributed to elevated INR.     Goal of Therapy:  INR 2-3   Plan:   No warfarin tonight.    Daily PT/INR.  Monitor for signs and symptoms of bleeding.  Charolotte Ekeom Gracen Ringwald, PharmD, pager [phone]. 06/10/2015,10:09 AM.

## 2015-06-11 ENCOUNTER — Inpatient Hospital Stay (HOSPITAL_COMMUNITY): Payer: Medicare Other

## 2015-06-11 LAB — COMPREHENSIVE METABOLIC PANEL
ALT: 12 U/L — AB (ref 14–54)
AST: 11 U/L — AB (ref 15–41)
Albumin: 2.6 g/dL — ABNORMAL LOW (ref 3.5–5.0)
Alkaline Phosphatase: 48 U/L (ref 38–126)
Anion gap: 11 (ref 5–15)
BUN: 36 mg/dL — AB (ref 6–20)
CHLORIDE: 100 mmol/L — AB (ref 101–111)
CO2: 31 mmol/L (ref 22–32)
CREATININE: 1.15 mg/dL — AB (ref 0.44–1.00)
Calcium: 9.2 mg/dL (ref 8.9–10.3)
GFR calc non Af Amer: 40 mL/min — ABNORMAL LOW (ref >60)
GFR, EST AFRICAN AMERICAN: 46 mL/min — AB (ref >60)
Glucose, Bld: 216 mg/dL — ABNORMAL HIGH (ref 65–99)
POTASSIUM: 4.7 mmol/L (ref 3.5–5.1)
SODIUM: 142 mmol/L (ref 135–145)
Total Bilirubin: 0.9 mg/dL (ref 0.3–1.2)
Total Protein: 5.9 g/dL — ABNORMAL LOW (ref 6.5–8.1)

## 2015-06-11 LAB — CBC WITH DIFFERENTIAL/PLATELET
Basophils Absolute: 0 10*3/uL (ref 0.0–0.1)
Basophils Relative: 0 %
EOS ABS: 0 10*3/uL (ref 0.0–0.7)
Eosinophils Relative: 0 %
HCT: 31.4 % — ABNORMAL LOW (ref 36.0–46.0)
HEMOGLOBIN: 9.8 g/dL — AB (ref 12.0–15.0)
LYMPHS ABS: 0.6 10*3/uL — AB (ref 0.7–4.0)
LYMPHS PCT: 4 %
MCH: 27.2 pg (ref 26.0–34.0)
MCHC: 31.2 g/dL (ref 30.0–36.0)
MCV: 87.2 fL (ref 78.0–100.0)
MONOS PCT: 4 %
Monocytes Absolute: 0.7 10*3/uL (ref 0.1–1.0)
Neutro Abs: 17 10*3/uL — ABNORMAL HIGH (ref 1.7–7.7)
Neutrophils Relative %: 92 %
Platelets: 257 10*3/uL (ref 150–400)
RBC: 3.6 MIL/uL — ABNORMAL LOW (ref 3.87–5.11)
RDW: 17.2 % — ABNORMAL HIGH (ref 11.5–15.5)
WBC: 18.4 10*3/uL — ABNORMAL HIGH (ref 4.0–10.5)

## 2015-06-11 LAB — PROTIME-INR
INR: 2.39 — ABNORMAL HIGH (ref 0.00–1.49)
PROTHROMBIN TIME: 25.8 s — AB (ref 11.6–15.2)

## 2015-06-11 MED ORDER — ACETAMINOPHEN 325 MG PO TABS
650.0000 mg | ORAL_TABLET | Freq: Four times a day (QID) | ORAL | Status: DC | PRN
  Administered 2015-06-11: 650 mg via ORAL
  Filled 2015-06-11 (×2): qty 2

## 2015-06-11 MED ORDER — FUROSEMIDE 10 MG/ML IJ SOLN
20.0000 mg | Freq: Once | INTRAMUSCULAR | Status: AC
  Administered 2015-06-11: 20 mg via INTRAVENOUS
  Filled 2015-06-11: qty 2

## 2015-06-11 MED ORDER — WARFARIN SODIUM 3 MG PO TABS
3.0000 mg | ORAL_TABLET | Freq: Once | ORAL | Status: AC
  Administered 2015-06-11: 3 mg via ORAL
  Filled 2015-06-11: qty 1

## 2015-06-11 NOTE — Progress Notes (Signed)
[facility] PROGRESS NOTE  Tonya Farley ZOX:[id number] DOB: 01-Feb-1920 DOA: 05/30/2015 PCP: Lorenda Peck, MD  Summary 06/07/15: I have seen and examined Tonya Farley at bedside in the presence of her niece and reviewed her chart. Tonya Farley is a pleasant 82 y.o. female with paroxysmal atrial fibrillation on Coumadin who presented to the ED with c/o weakness and 4 day history of cough, nausea, decreased PO intake, generalized weakness and she was Admitted for community acquired pneumonia, and found to have Influenza type A. She continues to have nonproductive cough despite completing course of Levaquin, was not given Tamiflu. Echocardiogram obtained, could not evaluate diastolic function. Wonder if patient has recurrent aspiration pneumonia. Her white count is increasing off antibiotics. We'll therefore obtain CT chest without contrast to better evaluate lung parenchyma, give clindamycin/Tamiflu/give low-dose steroids and reassess. Family says patient once her food liberalized and she does not went to have MBS. They understand that she may be at risk for recurrent aspiration pneumonitis. Will increase Mucinex to 1200 mg twice a day. 06/08/15: CT chest suggests aspiration pneumonitis, "Patchy hazy peripheral opacification and reticulonodular opacification over the upper lobes which may be due to a pneumonia versus atypical infection or inflammatory process. Small bilateral pleural effusions with bibasilar atelectasis. Dependent debris within the left lower lobe bronchus which may be due to aspirated material and raises possibility of aspiration pneumonia". White count has increased to 15,500 and this is concerning. There may be element of steroid induced degranulation. Will increase dose of clindamycin, reduce Solu-Medrol and reassess. Patient feels better today but her appetite remains poor. 06/09/15: Patient ill looking with increasing leukocytosis, persistent cough. Her renal function  is also worse. 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 I discussed the plan of care with patient's niece at the bedside. 06/10/15: Still coughing. Dyspneic. Wbc improved. INR slightly elevated. Will resume low dose steroids and continue current antibiotics. Patient does not feel great. 06/11/15: White count increased to 18,000 but patient feels somewhat better today(steroid element). Patient has been on antibiotics for a total of more than 10 days, therefore we will discontinue current antibiotics and monitor for evidence of lingering infection. Will consult ID if white count continues to increase/evidence of persistent infection. Meanwhile, will obtain repeat chest x-ray. We'll continue low-dose steroids for now. Her INR is supratherapeutic. There is no evidence of bleeding. Will monitor and continue Coumadin per pharmacy for target INR 2-3. Appreciate pharmacy help. Discussed care plan with patient's niece at the bedside. Patient will eventually transition to skilled nursing facility. Plan CAP (community acquired pneumonia)Chest congestion/aspiration pneumonitis/Dyspnea/ Influenza due to identified novel influenza A virus with other respiratory manifestations  Discontinue Clindamycin/Tamiflu/Vancomycin. Plan culture if fever.  Chest x-ray  Continue Prednisone/Tussionex/bronchodilators Paroxysmal Atrial fibrillation (HCC)  Rate controlled  Continue Coumadin per pharmacy target INR 2-3. Appreciate pharmacy help. AKI (acute kidney injury) (HCC)  Likely related to ongoing infection  Gentle fluids  Code Status: DNR Family Communication: Niece at bedside Disposition Plan: Tbd   Consultants:  PCCM  Procedures:    Antibiotics:  Completed Levaquin course  Clindamycin 06/07/2015>>06/11/15  Tamiflu 06/07/2015>>06/11/15  Vancomycin  06/09/2015>>06/11/15  HPI/Subjective: Feels somewhat better today.  Objective: Filed Vitals:   06/10/15 2100 06/11/15 0512  BP: 143/70 146/57  Pulse: 99 88  Temp: 98 F (36.7 C) 98.1 F (36.7 C)  Resp: 20 18    Intake/Output Summary (  Last 24 hours) at 06/11/15 1337 Last data filed at 06/11/15 1027  Gross per 24 hour  Intake 2787.5 ml  Output    100 ml  Net 2687.5 ml   Filed Weights   06/09/15 0500 06/10/15 0422 06/11/15 0500  Weight: 72.8 kg (160 lb 7.9 oz) 73.71 kg (162 lb 8 oz) 74 kg (163 lb 2.3 oz)    Exam:   General:  Sitting up in chair.  Cardiovascular: S1-S2 normal. No murmurs. Pulse regular.  Respiratory: Good air entry bilaterally. Bilateral transmitted sounds.  Abdomen: Soft and nontender. Normal bowel sounds. No organomegaly.  Musculoskeletal: No pedal edema   Neurological: Intact  Data Reviewed: Basic Metabolic Panel:  Recent Labs Lab 06/07/15 0529 06/08/15 0531 06/09/15 0509 06/10/15 0608 06/11/15 0614  NA 142 143 142 142 142  K 4.6 5.3* 4.4 4.1 4.7  CL 102 101 100* 99* 100*  CO2 32 32 31 34* 31  GLUCOSE 113* 195* 179* 108* 216*  BUN 29* 31* 40* 40* 36*  CREATININE 1.16* 1.06* 1.21* 1.18* 1.15*  CALCIUM 9.5 9.8 9.4 9.0 9.2   Liver Function Tests:  Recent Labs Lab 06/08/15 0531 06/09/15 0509 06/10/15 0608 06/11/15 0614  AST 21 12* 11* 11*  ALT 10* 14 13* 12*  ALKPHOS 48 43 43 48  BILITOT 1.4* 0.5 0.8 0.9  PROT 6.6 6.2* 5.6* 5.9*  ALBUMIN 3.0* 2.8* 2.5* 2.6*   No results for input(s): LIPASE, AMYLASE in the last 168 hours. No results for input(s): AMMONIA in the last 168 hours. CBC:  Recent Labs Lab 06/07/15 0529 06/08/15 0531 06/09/15 0509 06/10/15 0608 06/11/15 0614  WBC 12.5* 15.2* 15.6* 14.2* 18.4*  NEUTROABS  --  13.8* 13.3* 11.8* 17.0*  HGB 10.3* 10.8* 10.3* 10.3* 9.8*  HCT 31.8* 33.7* 31.7* 33.0* 31.4*  MCV 82.6 86.2 82.8 87.5 87.2  PLT 237 255 249 246 257   Cardiac Enzymes: No results for input(s):  CKTOTAL, CKMB, CKMBINDEX, TROPONINI in the last 168 hours. BNP (last 3 results)  Recent Labs  01/10/15 2016 05/30/15 1852 06/03/15 1611  BNP 382.1* 212.3* 358.1*    ProBNP (last 3 results) No results for input(s): PROBNP in the last 8760 hours.  CBG: No results for input(s): GLUCAP in the last 168 hours.  Recent Results (from the past 240 hour(s))  Culture, Urine     Status: None   Collection Time: 06/09/15 10:14 AM  Result Value Ref Range Status   Specimen Description URINE, CLEAN CATCH  Final   Special Requests NONE  Final   Culture   Final    MULTIPLE SPECIES PRESENT, SUGGEST RECOLLECTION Performed at [facility]    Report Status 06/10/2015 FINAL  Final     Studies: No results found.  Scheduled Meds: . albuterol  2.5 mg Nebulization TID  . antiseptic oral rinse  7 mL Mouth Rinse BID  . atorvastatin  10 mg Oral Daily  . brimonidine  1 drop Both Eyes TID  . chlorpheniramine-HYDROcodone  5 mL Oral Q12H  . diltiazem  360 mg Oral Daily  . feeding supplement (ENSURE ENLIVE)  237 mL Oral BID BM  . furosemide  20 mg Oral Daily  . labetalol  200 mg Oral BID  . latanoprost  1 drop Both Eyes QHS  . levothyroxine  100 mcg Oral QAC breakfast  . multivitamin with minerals  1 tablet Oral Daily  . pantoprazole  40 mg Oral Daily  . predniSONE  30 mg Oral Q breakfast  .  timolol  1 drop Both Eyes q morning - 10a  . Warfarin - Pharmacist Dosing Inpatient   Does not apply q1800   Continuous Infusions: . dextrose 5 % and 0.9% NaCl 50 mL/hr at 06/10/15 2107     Time spent: 25 minutes    Kimarion Chery  [facility] Pager [phone]. If 7PM-7AM, please contact night-coverage at [email or web address], password [email or web address] 06/11/2015, 1:37 PM  LOS: 12 days

## 2015-06-11 NOTE — Progress Notes (Signed)
ANTICOAGULATION CONSULT NOTE - Follow up  Pharmacy Consult for Warfarin Indication: atrial fibrillation  Allergies  Allergen Reactions  . Penicillins Anaphylaxis, Hives and Swelling       . Sulfa Antibiotics Anaphylaxis and Swelling    Patient Measurements: Height: 5\' 4"  (162.6 cm) Weight: 163 lb 2.3 oz (74 kg) IBW/kg (Calculated) : 54.7  Vital Signs: Temp: 98.1 F (36.7 C) (03/19 0512) Temp Source: Oral (03/19 0512) BP: 146/57 mmHg (03/19 0512) Pulse Rate: 88 (03/19 0512)  Labs:  Recent Labs  06/09/15 0509 06/10/15 0608 06/11/15 0614  HGB 10.3* 10.3* 9.8*  HCT 31.7* 33.0* 31.4*  PLT 249 246 257  LABPROT 42.7* 39.6* 25.8*  INR 4.66* 4.23* 2.39*  CREATININE 1.21* 1.18* 1.15*    Estimated Creatinine Clearance: 30.1 mL/min (by C-G formula based on Cr of 1.15).  Assessment: [age] yoF admitted on 3/7 for pneumonia.  PMH includes HTN, hyperlipidemia, and AFib on chronic warfarin anticoagulation.  PTA Warfarin regimen = 3mg  daily except 2mg  MWF; last reported dose taken on 3/6 @ 21:00. INR upon admission = 1.85 (subtherapeutic). Pharmacy consulted to continue dosing of warfarin.  Today, 06/11/2015:  INR decreased significantly over the last 24 hours, now in therapeutic range.     CBC: Hgb low but stable, Plt WNL  No reported bleeding or complications.  Diet: heart healthy, ~50% meal intake charted.  Drug-drug interactions: Levaquin(from 3/6-3/14) likely contributed to elevated INR.     Goal of Therapy:  INR 2-3   Plan:   Give warfarin 3mg  today at noon.     Daily PT/INR.  Monitor for signs and symptoms of bleeding.  Charolotte Ekeom Rhealyn Cullen, PharmD, pager [phone]. 06/11/2015,10:02 AM.

## 2015-06-11 NOTE — Progress Notes (Signed)
[facility] PROGRESS NOTE  Driscilla MoatsMamie N Spruiell OZH:[id number] DOB: 02/16/1920 DOA: 05/30/2015 PCP: Lorenda PeckOBERTS, RONALD WAYNE, MD  Summary 06/07/15: I have seen and examined Tonya Farley at bedside in the presence of her niece and reviewed her chart. Tonya Farley is a pleasant 82 y.o. female with paroxysmal atrial fibrillation on Coumadin who presented to the ED with c/o weakness and 4 day history of cough, nausea, decreased PO intake, generalized weakness and she was Admitted for community acquired pneumonia, and found to have Influenza type A. She continues to have nonproductive cough despite completing course of Levaquin, was not given Tamiflu. Echocardiogram obtained, could not evaluate diastolic function. Wonder if patient has recurrent aspiration pneumonia. Her white count is increasing off antibiotics. We'll therefore obtain CT chest without contrast to better evaluate lung parenchyma, give clindamycin/Tamiflu/give low-dose steroids and reassess. Family says patient once her food liberalized and she does not went to have MBS. They understand that she may be at risk for recurrent aspiration pneumonitis. Will increase Mucinex to 1200 mg twice a day. 06/08/15: CT chest suggests aspiration pneumonitis, "Patchy hazy peripheral opacification and reticulonodular opacification over the upper lobes which may be due to a pneumonia versus atypical infection or inflammatory process. Small bilateral pleural effusions with bibasilar atelectasis. Dependent debris within the left lower lobe bronchus which may be due to aspirated material and raises possibility of aspiration pneumonia". White count has increased to 15,500 and this is concerning. There may be element of steroid induced degranulation. Will increase dose of clindamycin, reduce Solu-Medrol and reassess. Patient feels better today but her appetite remains poor. 06/09/15: Patient ill looking with increasing leukocytosis, persistent cough. Her renal function  is also worse. 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 We'll therefore add vancomycin, give gentle fluids, reduce dose of Lasix and give Tussionex. I discussed the plan of care with patient's niece at the bedside. 06/10/15: Still coughing. Dyspneic. Wbc improved. INR slightly elevated. Will resume low dose steroids and continue current antibiotics. Patient does not feel great. Plan CAP (community acquired pneumonia)Chest congestion/aspiration pneumonitis/Dyspnea/ Influenza due to identified novel influenza A virus with other respiratory manifestations  Continue Clindamycin/Tamiflu/Vancomycin  Prednisone  Tussionex Paroxysmal Atrial fibrillation (HCC)  Rate controlled  Continue Coumadin per pharmacy target INR 2-3. Appreciate pharmacy help. AKI (acute kidney injury) (HCC)  Likely related to ongoing infection  Gentle fluids  Code Status: DNR Family Communication: Niece at bedside Disposition Plan: Tbd   Consultants:  PCCM  Procedures:    Antibiotics:  Completed Levaquin course  Clindamycin 06/07/2015>>  Tamiflu 06/07/2015>>  Vancomycin 06/09/2015>>  HPI/Subjective: Doesn't feel great  Objective: Filed Vitals:   06/10/15 1336 06/10/15 2100  BP: 105/41 143/70  Pulse: 95 99  Temp: 98 F (36.7 C) 98 F (36.7 C)  Resp: 20 20    Intake/Output Summary (Last 24 hours) at 06/11/15 0150 Last data filed at 06/10/15 1900  Gross per 24 hour  Intake 1944.17 ml  Output      0 ml  Net 1944.17 ml   Filed Weights   06/08/15 0505 06/09/15 0500 06/10/15 0422  Weight: 73.165 kg (161 lb 4.8 oz) 72.8 kg (160 lb 7.9 oz) 73.71 kg (162 lb 8 oz)    Exam:   General:  Mild respiratory distress.  Cardiovascular: S1-S2 normal. No murmurs. Pulse regular.  Respiratory: Good air entry bilaterally. Scant rhonchi, transmitted sounds.  Abdomen: Soft and nontender. Normal bowel sounds. No organomegaly.  Musculoskeletal: No pedal  edema  Neurological: Intact  Data Reviewed: Basic Metabolic Panel:  Recent Labs Lab 06/06/15 0513 06/07/15 0529 06/08/15 0531 06/09/15 0509 06/10/15 0608  NA 137 142 143 142 142  K 4.4 4.6 5.3* 4.4 4.1  CL 102 102 101 100* 99*  CO2 28 32 32 31 34*  GLUCOSE 137* 113* 195* 179* 108*  BUN 34* 29* 31* 40* 40*  CREATININE 1.27* 1.16* 1.06* 1.21* 1.18*  CALCIUM 9.2 9.5 9.8 9.4 9.0   Liver Function Tests:  Recent Labs Lab 06/08/15 0531 06/09/15 0509 06/10/15 0608  AST 21 12* 11*  ALT 10* 14 13*  ALKPHOS 48 43 43  BILITOT 1.4* 0.5 0.8  PROT 6.6 6.2* 5.6*  ALBUMIN 3.0* 2.8* 2.5*   No results for input(s): LIPASE, AMYLASE in the last 168 hours. No results for input(s): AMMONIA in the last 168 hours. CBC:  Recent Labs Lab 06/06/15 0513 06/07/15 0529 06/08/15 0531 06/09/15 0509 06/10/15 0608  WBC 11.0* 12.5* 15.2* 15.6* 14.2*  NEUTROABS  --   --  13.8* 13.3* 11.8*  HGB 10.3* 10.3* 10.8* 10.3* 10.3*  HCT 32.5* 31.8* 33.7* 31.7* 33.0*  MCV 86.9 82.6 86.2 82.8 87.5  PLT 192 237 255 249 246   Cardiac Enzymes: No results for input(s): CKTOTAL, CKMB, CKMBINDEX, TROPONINI in the last 168 hours. BNP (last 3 results)  Recent Labs  01/10/15 2016 05/30/15 1852 06/03/15 1611  BNP 382.1* 212.3* 358.1*    ProBNP (last 3 results) No results for input(s): PROBNP in the last 8760 hours.  CBG: No results for input(s): GLUCAP in the last 168 hours.  Recent Results (from the past 240 hour(s))  Culture, Urine     Status: None   Collection Time: 06/09/15 10:14 AM  Result Value Ref Range Status   Specimen Description URINE, CLEAN CATCH  Final   Special Requests NONE  Final   Culture   Final    MULTIPLE SPECIES PRESENT, SUGGEST RECOLLECTION Performed at [facility]    Report Status 06/10/2015 FINAL  Final     Studies: No results found.  Scheduled Meds: . albuterol  2.5 mg Nebulization TID  . antiseptic oral rinse  7 mL Mouth Rinse BID  . atorvastatin   10 mg Oral Daily  . brimonidine  1 drop Both Eyes TID  . chlorpheniramine-HYDROcodone  5 mL Oral Q12H  . clindamycin (CLEOCIN) IV  600 mg Intravenous Q8H  . diltiazem  360 mg Oral Daily  . feeding supplement (ENSURE ENLIVE)  237 mL Oral BID BM  . furosemide  20 mg Oral Daily  . labetalol  200 mg Oral BID  . latanoprost  1 drop Both Eyes QHS  . levothyroxine  100 mcg Oral QAC breakfast  . multivitamin with minerals  1 tablet Oral Daily  . oseltamivir  30 mg Oral BID  . pantoprazole  40 mg Oral Daily  . predniSONE  30 mg Oral Q breakfast  . timolol  1 drop Both Eyes q morning - 10a  . vancomycin  1,000 mg Intravenous Q24H  . Warfarin - Pharmacist Dosing Inpatient   Does not apply q1800   Continuous Infusions: . dextrose 5 % and 0.9% NaCl 50 mL/hr at 06/10/15 2107     Time spent: 25 minutes    Davien Malone  [facility] Pager [phone]. If 7PM-7AM, please contact night-coverage at [email or web address], password [facility] 06/11/2015, 1:50 AM  LOS: 12 days

## 2015-06-12 ENCOUNTER — Inpatient Hospital Stay (HOSPITAL_COMMUNITY): Payer: Medicare Other

## 2015-06-12 ENCOUNTER — Encounter (HOSPITAL_COMMUNITY): Payer: Self-pay | Admitting: Radiology

## 2015-06-12 LAB — CBC WITH DIFFERENTIAL/PLATELET
BASOS ABS: 0 10*3/uL (ref 0.0–0.1)
BASOS PCT: 0 %
Eosinophils Absolute: 0 10*3/uL (ref 0.0–0.7)
Eosinophils Relative: 0 %
HEMATOCRIT: 31.2 % — AB (ref 36.0–46.0)
HEMOGLOBIN: 9.9 g/dL — AB (ref 12.0–15.0)
Lymphocytes Relative: 3 %
Lymphs Abs: 0.5 10*3/uL — ABNORMAL LOW (ref 0.7–4.0)
MCH: 27.6 pg (ref 26.0–34.0)
MCHC: 31.7 g/dL (ref 30.0–36.0)
MCV: 86.9 fL (ref 78.0–100.0)
MONOS PCT: 9 %
Monocytes Absolute: 1.7 10*3/uL — ABNORMAL HIGH (ref 0.1–1.0)
NEUTROS ABS: 16.9 10*3/uL — AB (ref 1.7–7.7)
Neutrophils Relative %: 88 %
Platelets: 240 10*3/uL (ref 150–400)
RBC: 3.59 MIL/uL — ABNORMAL LOW (ref 3.87–5.11)
RDW: 17.2 % — ABNORMAL HIGH (ref 11.5–15.5)
WBC: 19.1 10*3/uL — ABNORMAL HIGH (ref 4.0–10.5)

## 2015-06-12 LAB — COMPREHENSIVE METABOLIC PANEL
ALK PHOS: 51 U/L (ref 38–126)
ALT: 12 U/L — ABNORMAL LOW (ref 14–54)
ANION GAP: 9 (ref 5–15)
AST: 11 U/L — ABNORMAL LOW (ref 15–41)
Albumin: 2.8 g/dL — ABNORMAL LOW (ref 3.5–5.0)
BUN: 36 mg/dL — ABNORMAL HIGH (ref 6–20)
CALCIUM: 9 mg/dL (ref 8.9–10.3)
CO2: 32 mmol/L (ref 22–32)
Chloride: 99 mmol/L — ABNORMAL LOW (ref 101–111)
Creatinine, Ser: 1.07 mg/dL — ABNORMAL HIGH (ref 0.44–1.00)
GFR calc non Af Amer: 43 mL/min — ABNORMAL LOW (ref >60)
GFR, EST AFRICAN AMERICAN: 50 mL/min — AB (ref >60)
Glucose, Bld: 160 mg/dL — ABNORMAL HIGH (ref 65–99)
Potassium: 4.2 mmol/L (ref 3.5–5.1)
SODIUM: 140 mmol/L (ref 135–145)
TOTAL PROTEIN: 6.1 g/dL — AB (ref 6.5–8.1)
Total Bilirubin: 0.7 mg/dL (ref 0.3–1.2)

## 2015-06-12 LAB — HEMOGLOBIN A1C
Hgb A1c MFr Bld: 6.4 % — ABNORMAL HIGH (ref 4.8–5.6)
Mean Plasma Glucose: 137 mg/dL

## 2015-06-12 LAB — PROTIME-INR
INR: 2.88 — ABNORMAL HIGH (ref 0.00–1.49)
PROTHROMBIN TIME: 29.7 s — AB (ref 11.6–15.2)

## 2015-06-12 MED ORDER — VANCOMYCIN HCL IN DEXTROSE 1-5 GM/200ML-% IV SOLN
1000.0000 mg | Freq: Every day | INTRAVENOUS | Status: DC
  Administered 2015-06-13 – 2015-06-14 (×3): 1000 mg via INTRAVENOUS
  Filled 2015-06-12 (×3): qty 200

## 2015-06-12 MED ORDER — VITAMIN K1 10 MG/ML IJ SOLN
5.0000 mg | Freq: Once | INTRAMUSCULAR | Status: AC
  Administered 2015-06-12: 5 mg via INTRAVENOUS
  Filled 2015-06-12: qty 0.5

## 2015-06-12 MED ORDER — PHYTONADIONE 5 MG PO TABS: 10.0000 mg | ORAL_TABLET | Freq: Once | ORAL | Status: DC

## 2015-06-12 MED ORDER — PREDNISONE 10 MG PO TABS
10.0000 mg | ORAL_TABLET | Freq: Every day | ORAL | Status: DC
  Administered 2015-06-13 – 2015-06-19 (×7): 10 mg via ORAL
  Filled 2015-06-12 (×7): qty 1

## 2015-06-12 MED ORDER — DEXTROSE 5 % IV SOLN
2.0000 g | Freq: Three times a day (TID) | INTRAVENOUS | Status: AC
  Administered 2015-06-13 – 2015-06-16 (×13): 2 g via INTRAVENOUS
  Filled 2015-06-12 (×13): qty 2

## 2015-06-12 MED ORDER — WARFARIN SODIUM 1 MG PO TABS
1.0000 mg | ORAL_TABLET | Freq: Once | ORAL | Status: AC
  Administered 2015-06-12: 1 mg via ORAL
  Filled 2015-06-12: qty 1

## 2015-06-12 MED ORDER — METRONIDAZOLE IN NACL 5-0.79 MG/ML-% IV SOLN
500.0000 mg | Freq: Three times a day (TID) | INTRAVENOUS | Status: DC
  Administered 2015-06-12 – 2015-06-13 (×3): 500 mg via INTRAVENOUS
  Filled 2015-06-12 (×4): qty 100

## 2015-06-12 MED ORDER — GUAIFENESIN ER 600 MG PO TB12
1200.0000 mg | ORAL_TABLET | Freq: Two times a day (BID) | ORAL | Status: DC
  Administered 2015-06-12 – 2015-06-18 (×13): 1200 mg via ORAL
  Filled 2015-06-12 (×17): qty 2

## 2015-06-12 MED ORDER — FUROSEMIDE 10 MG/ML IJ SOLN
20.0000 mg | Freq: Once | INTRAMUSCULAR | Status: AC
  Administered 2015-06-12: 20 mg via INTRAVENOUS
  Filled 2015-06-12: qty 2

## 2015-06-12 NOTE — Progress Notes (Signed)
ANTICOAGULATION CONSULT NOTE - Follow up  Pharmacy Consult for coumadin Indication: atrial fibrillation  Allergies  Allergen Reactions  . Penicillins Anaphylaxis, Hives and Swelling       . Sulfa Antibiotics Anaphylaxis and Swelling    Patient Measurements: Height: 5\' 4"  (162.6 cm) Weight: 167 lb 8.8 oz (76 kg) IBW/kg (Calculated) : 54.7  Vital Signs: Temp: 97.9 F (36.6 C) (03/20 0435) Temp Source: Oral (03/20 0435) BP: 158/51 mmHg (03/20 0435) Pulse Rate: 77 (03/20 0435)  Labs:  Recent Labs  06/10/15 0608 06/11/15 0614 06/12/15 0520  HGB 10.3* 9.8* 9.9*  HCT 33.0* 31.4* 31.2*  PLT 246 257 240  LABPROT 39.6* 25.8* 29.7*  INR 4.23* 2.39* 2.88*  CREATININE 1.18* 1.15* 1.07*    Estimated Creatinine Clearance: 32.8 mL/min (by C-G formula based on Cr of 1.07).  Assessment: [age] yoF on coumadin PTA for Afib who was admitted on 3/7 for pneumonia.  - PTA Warfarin regimen = 3mg  daily except 2mg  MWF; last reported dose taken on 3/6 @ 21:00. INR upon admission = 1.85 (subtherapeutic). - Pharmacy consulted to continue dosing of warfarin.  Today, 06/12/2015:  INR remains therapeutic, but increased noticeable from 2.39 to 2.88 after one dose of 3 mg given last night  CBC: Hgb low but stable, Plt WNL  No bleeding documented  Diet: heart healthy, ~10- 50% meal intake charted.  Drug-drug interactions: Levaquin(from 3/6-3/14) likely contributed to elevated INR; No signif. Drug-drug intxns noted at this time    Goal of Therapy:  INR 2-3   Plan:   Coumadin 1 mg PO x1 today    Daily PT/INR.  Monitor for signs and symptoms of bleeding.  Dorna LeitzAnh Robyn Galati, PharmD, BCPS 06/12/2015 9:40 AM

## 2015-06-12 NOTE — Care Management Important Message (Signed)
Important Message  Patient Details IM Letter given to [name]/Case Manager to present to Patient Name: Driscilla MoatsMamie N Doukas MRN: [id number] Date of Birth: 10/07/1919   Medicare Important Message Given:  Yes    [name], Sandrine Bloodsworth 06/12/2015, 10:55 AMImportant Message  Patient Details  Name: Driscilla MoatsMamie N Fleury MRN: [id number] Date of Birth: 10/30/1919   Medicare Important Message Given:  Yes    [name], Addy Mcmannis 06/12/2015, 10:55 AM

## 2015-06-12 NOTE — Progress Notes (Signed)
Patient has a bed at [facility]. CSW has completed FL2 & will continue to follow and assist with discharge when ready.    Lezley Bedgood, LCSW [facility] Clinical Social Worker cell #: [phone]    

## 2015-06-12 NOTE — Progress Notes (Addendum)
[facility] PROGRESS NOTE  [name] Farley:[id number] DOB: 17-Sep-1919 DOA: 05/30/2015 PCP: Lorenda Peck, MD  Summary 06/07/15: I have seen and examined Tonya Farley at bedside in the presence of her niece and reviewed her chart. Tonya Farley is a pleasant 82 y.o. female with paroxysmal atrial fibrillation on Coumadin who presented to the ED with c/o weakness and 4 day history of cough, nausea, decreased PO intake, generalized weakness and she was Admitted for community acquired pneumonia, and found to have Influenza type A. She continues to have nonproductive cough despite completing course of Levaquin, was not given Tamiflu. Echocardiogram obtained, could not evaluate diastolic function. Wonder if patient has recurrent aspiration pneumonia. Her white count is increasing off antibiotics. We'll therefore obtain CT chest without contrast to better evaluate lung parenchyma, give clindamycin/Tamiflu/give low-dose steroids and reassess. Family says patient once her food liberalized and she does not went to have MBS. They understand that she may be at risk for recurrent aspiration pneumonitis. Will increase Mucinex to 1200 mg twice a day. 06/08/15: CT chest suggests aspiration pneumonitis, "Patchy hazy peripheral opacification and reticulonodular opacification over the upper lobes which may be due to a pneumonia versus atypical infection or inflammatory process. Small bilateral pleural effusions with bibasilar atelectasis. Dependent debris within the left lower lobe bronchus which may be due to aspirated material and raises possibility of aspiration pneumonia". White count has increased to 15,500 and this is concerning. There may be element of steroid induced degranulation. Will increase dose of clindamycin, reduce Solu-Medrol and reassess. Patient feels better today but her appetite remains poor. 06/09/15: Patient ill looking with increasing leukocytosis, persistent cough. Her renal function  is also worse. 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 I discussed the plan of care with patient's niece at the bedside. 06/10/15: Still coughing. Dyspneic. Wbc improved. INR slightly elevated. Will resume low dose steroids and continue current antibiotics. Patient does not feel great. 06/11/15: White count increased to 18,000 but patient feels somewhat better today(steroid element). Patient has been on antibiotics for a total of more than 10 days, therefore we will discontinue current antibiotics and monitor for evidence of lingering infection. Will consult ID if white count continues to increase/evidence of persistent infection. Meanwhile, will obtain repeat chest x-ray. We'll continue low-dose steroids for now. Her INR is supratherapeutic. There is no evidence of bleeding. Will monitor and continue Coumadin per pharmacy for target INR 2-3. Appreciate pharmacy help. Discussed care plan with patient's niece at the bedside. Patient will eventually transition to skilled nursing facility. 06/12/15: Patient has worsening respiratory distress, with increasing leukocytosis although she says she feels somewhat better today. Her chest x-ray suggests bibasilar infiltrates not changed. This has prompted repeat CT chest without contrast which shows increasing effusions/infiltrates "Increasing bilateral pleural effusions as well as increasing bilateral infiltrates. Continued follow-up is recommended". I have discussed this development with patient's niece Tonya Farley over the phone and she is okay with pursuing ultrasound-guided thoracentesis-will consult ID/pulmonary also in a.m. Meanwhile will give vitamin K, stop Coumadin for now, request ultrasound-guided thoracentesis and resume antibiotics(Vanc/Aztreonam/Flagyl). Plan CAP (community acquired pneumonia)/ bilateral pleural effusions/aspiration  pneumonitis/Dyspnea/ Influenza due to identified novel influenza A virus with other respiratory manifestations  Ultrasound-guided thracentesis in a.m.  Vancomycin/Azactam/Flagyl  Consult ID/pulmonary in a.m.  Continue bronchodilators/low dose steroids/oxygen supplementation Paroxysmal Atrial fibrillation (HCC)  Rate controlled  Discontinue Coumadin for now and  give vitamin K AKI (acute kidney injury) (HCC)  Improved some Right arm swelling  ? Thrombophlebitis  Monitor   Code Status: DNR Family Communication: Niece at bedsid/over the phone. e Disposition Plan: Tbd   Consultants:  PCCM  Procedures:    Antibiotics:  Completed Levaquin course  Clindamycin 06/07/2015>>06/11/15  Tamiflu 06/07/2015>>06/11/15  Vancomycin 06/09/2015>>06/11/15, 06/12/15>>  Azactam 06/12/15>>  Flagyl 06/12/15>>  HPI/Subjective: Feels a little better.  Objective: Filed Vitals:   06/12/15 1256 06/12/15 2059  BP: 167/72 155/62  Pulse:  97  Temp:  98.1 F (36.7 C)  Resp:  24    Intake/Output Summary (Last 24 hours) at 06/12/15 2106 Last data filed at 06/12/15 0900  Gross per 24 hour  Intake    240 ml  Output    400 ml  Net   -160 ml   Filed Weights   06/10/15 0422 06/11/15 0500 06/12/15 0435  Weight: 73.71 kg (162 lb 8 oz) 74 kg (163 lb 2.3 oz) 76 kg (167 lb 8.8 oz)    Exam:   General:  Comfortable at rest.  Cardiovascular: S1-S2 normal. No murmurs. Pulse regular.  Respiratory: Good air entry bilaterally. No rhonchi or rales.  Abdomen: Soft and nontender. Normal bowel sounds. No organomegaly.  Musculoskeletal: Right arm swelling   Neurological: Intact  Data Reviewed: Basic Metabolic Panel:  Recent Labs Lab 06/08/15 0531 06/09/15 0509 06/10/15 0608 06/11/15 0614 06/12/15 0520  NA 143 142 142 142 140  K 5.3* 4.4 4.1 4.7 4.2  CL 101 100* 99* 100* 99*  CO2 32 31 34* 31 32  GLUCOSE 195* 179* 108* 216* 160*  BUN 31* 40* 40* 36* 36*  CREATININE 1.06*  1.21* 1.18* 1.15* 1.07*  CALCIUM 9.8 9.4 9.0 9.2 9.0   Liver Function Tests:  Recent Labs Lab 06/08/15 0531 06/09/15 0509 06/10/15 0608 06/11/15 0614 06/12/15 0520  AST 21 12* 11* 11* 11*  ALT 10* 14 13* 12* 12*  ALKPHOS 48 43 43 48 51  BILITOT 1.4* 0.5 0.8 0.9 0.7  PROT 6.6 6.2* 5.6* 5.9* 6.1*  ALBUMIN 3.0* 2.8* 2.5* 2.6* 2.8*   No results for input(s): LIPASE, AMYLASE in the last 168 hours. No results for input(s): AMMONIA in the last 168 hours. CBC:  Recent Labs Lab 06/08/15 0531 06/09/15 0509 06/10/15 0608 06/11/15 0614 06/12/15 0520  WBC 15.2* 15.6* 14.2* 18.4* 19.1*  NEUTROABS 13.8* 13.3* 11.8* 17.0* 16.9*  HGB 10.8* 10.3* 10.3* 9.8* 9.9*  HCT 33.7* 31.7* 33.0* 31.4* 31.2*  MCV 86.2 82.8 87.5 87.2 86.9  PLT 255 249 246 257 240   Cardiac Enzymes: No results for input(s): CKTOTAL, CKMB, CKMBINDEX, TROPONINI in the last 168 hours. BNP (last 3 results)  Recent Labs  01/10/15 2016 05/30/15 1852 06/03/15 1611  BNP 382.1* 212.3* 358.1*    ProBNP (last 3 results) No results for input(s): PROBNP in the last 8760 hours.  CBG: No results for input(s): GLUCAP in the last 168 hours.  Recent Results (from the past 240 hour(s))  Culture, Urine     Status: None   Collection Time: 06/09/15 10:14 AM  Result Value Ref Range Status   Specimen Description URINE, CLEAN CATCH  Final   Special Requests NONE  Final   Culture   Final    MULTIPLE SPECIES PRESENT, SUGGEST RECOLLECTION [facility]    Report Status 06/10/2015 FINAL  Final     Studies: Ct Chest Wo Contrast  06/12/2015  CLINICAL DATA:  Cough and nausea for 4  days EXAM: CT CHEST WITHOUT CONTRAST TECHNIQUE: Multidetector CT imaging of the chest was performed following the standard protocol without IV contrast. COMPARISON:  06/07/2015 FINDINGS: Lungs are well aerated bilaterally with bilateral pleural effusions of a moderate degree. These have increased in the interval from the prior  exam. Bilateral lower lobe consolidation is again identified. There are new patchy infiltrative changes identified throughout both lungs. Some of these have a nodular component but given their acute nature are consistent with infiltrate. The thoracic inlet is within normal limits. Diffuse aortic calcifications are again identified. No significant hilar or mediastinal adenopathy is identified. There remains some dependent debris within the left bronchial tree likely related to some mucous. This is stable from the previous exam. The visualized upper abdomen and bony structures are stable. IMPRESSION: Increasing bilateral pleural effusions as well as increasing bilateral infiltrates. Continued follow-up is recommended. Electronically Signed   By: Alcide Clever M.D.   On: 06/12/2015 17:00   Dg Chest Port 1 View  06/11/2015  CLINICAL DATA:  Cough EXAM: PORTABLE CHEST 1 VIEW COMPARISON:  06/07/2015 FINDINGS: Cardiac shadow is enlarged but stable. Bibasilar changes are noted left slightly greater than right similar to that seen on the prior CT examination. No new focal abnormality is noted. IMPRESSION: Stable bibasilar changes from 06/07/2015 Electronically Signed   By: Alcide Clever M.D.   On: 06/11/2015 15:27    Scheduled Meds: . albuterol  2.5 mg Nebulization TID  . antiseptic oral rinse  7 mL Mouth Rinse BID  . atorvastatin  10 mg Oral Daily  . aztreonam  2 g Intravenous 3 times per day  . brimonidine  1 drop Both Eyes TID  . diltiazem  360 mg Oral Daily  . feeding supplement (ENSURE ENLIVE)  237 mL Oral BID BM  . furosemide  20 mg Oral Daily  . guaiFENesin  1,200 mg Oral BID  . labetalol  200 mg Oral BID  . latanoprost  1 drop Both Eyes QHS  . levothyroxine  100 mcg Oral QAC breakfast  . metronidazole  500 mg Intravenous 3 times per day  . multivitamin with minerals  1 tablet Oral Daily  . pantoprazole  40 mg Oral Daily  . phytonadione (VITAMIN K) IV  5 mg Intravenous Once  . [START ON 06/13/2015]  predniSONE  10 mg Oral Q breakfast  . timolol  1 drop Both Eyes q morning - 10a  . vancomycin  1,000 mg Intravenous QHS   Continuous Infusions:    Time spent: 25 minutes    Monty Mccarrell  [facility] Pager [phone]. If 7PM-7AM, please contact night-coverage at [email or web address], password [facility] 06/12/2015, 9:06 PM  LOS: 13 days

## 2015-06-12 NOTE — Progress Notes (Signed)
Pharmacy Antibiotic Note  Tonya MoatsMamie N Farley is a 82 y.o. female admitted on 05/30/2015 with pneumonia.  She was previously on Vanc, Clinda, Levaquin, Tamiflu.  Pharmacy has now been consulted for Vancomycin, Aztreonam dosing, MD dosing Flagy for suspected aspiration pneumonia.  Pending thoracentesis on 3/21.  Plan: Aztreonam 2g IV q8h Vancomycin 1g IV q24h. Measure Vanc trough at steady state. Follow up renal fxn, culture results, and clinical course.    Height: 5\' 4"  (162.6 cm) Weight: 167 lb 8.8 oz (76 kg) IBW/kg (Calculated) : 54.7  Temp (24hrs), Avg:98 F (36.7 C), Min:97.9 F (36.6 C), Max:98.1 F (36.7 C)   Recent Labs Lab 06/08/15 0531 06/09/15 0509 06/10/15 0608 06/11/15 0614 06/12/15 0520  WBC 15.2* 15.6* 14.2* 18.4* 19.1*  CREATININE 1.06* 1.21* 1.18* 1.15* 1.07*    Estimated Creatinine Clearance: 32.8 mL/min (by C-G formula based on Cr of 1.07).    Allergies  Allergen Reactions  . Penicillins Anaphylaxis, Hives and Swelling       . Sulfa Antibiotics Anaphylaxis and Swelling    Antimicrobials this admission: Levaquin 3/7 >> 3/14 Clinda 3/15 >> 3/19 Tamiflu 3/15 >> 3/19 Vanc 3/17 >> 3/19 Flagyl 3/20 >> Aztreonam 3/20 >> Vanc 3/20 >>  Dose adjustments this admission:  Microbiology results: 3/7 Strep pneumo Ur Ag: neg 3/8 BCx: NGF 3/8 Influenza PCR: A+ 3/17 UCx: suggest recollection  Thank you for allowing pharmacy to be a part of this patient's care.  Lynann Beaverhristine Denasia Venn PharmD, BCPS Pager [phone] 06/12/2015 9:17 PM

## 2015-06-13 ENCOUNTER — Inpatient Hospital Stay (HOSPITAL_COMMUNITY): Payer: Medicare Other

## 2015-06-13 DIAGNOSIS — Y95 Nosocomial condition: Secondary | ICD-10-CM

## 2015-06-13 DIAGNOSIS — E8770 Fluid overload, unspecified: Secondary | ICD-10-CM

## 2015-06-13 LAB — CBC WITH DIFFERENTIAL/PLATELET
BASOS PCT: 0 %
Basophils Absolute: 0 10*3/uL (ref 0.0–0.1)
EOS ABS: 0 10*3/uL (ref 0.0–0.7)
EOS PCT: 0 %
HCT: 31.8 % — ABNORMAL LOW (ref 36.0–46.0)
HEMOGLOBIN: 10 g/dL — AB (ref 12.0–15.0)
Lymphocytes Relative: 3 %
Lymphs Abs: 0.6 10*3/uL — ABNORMAL LOW (ref 0.7–4.0)
MCH: 27.2 pg (ref 26.0–34.0)
MCHC: 31.4 g/dL (ref 30.0–36.0)
MCV: 86.6 fL (ref 78.0–100.0)
MONOS PCT: 9 %
Monocytes Absolute: 1.8 10*3/uL — ABNORMAL HIGH (ref 0.1–1.0)
NEUTROS PCT: 88 %
Neutro Abs: 17.4 10*3/uL — ABNORMAL HIGH (ref 1.7–7.7)
PLATELETS: 245 10*3/uL (ref 150–400)
RBC: 3.67 MIL/uL — ABNORMAL LOW (ref 3.87–5.11)
RDW: 17 % — AB (ref 11.5–15.5)
WBC: 19.8 10*3/uL — ABNORMAL HIGH (ref 4.0–10.5)

## 2015-06-13 LAB — COMPREHENSIVE METABOLIC PANEL
ALK PHOS: 49 U/L (ref 38–126)
ALT: 12 U/L — ABNORMAL LOW (ref 14–54)
ANION GAP: 10 (ref 5–15)
AST: 13 U/L — ABNORMAL LOW (ref 15–41)
Albumin: 2.6 g/dL — ABNORMAL LOW (ref 3.5–5.0)
BUN: 38 mg/dL — ABNORMAL HIGH (ref 6–20)
CHLORIDE: 96 mmol/L — AB (ref 101–111)
CO2: 30 mmol/L (ref 22–32)
Calcium: 8.9 mg/dL (ref 8.9–10.3)
Creatinine, Ser: 1.01 mg/dL — ABNORMAL HIGH (ref 0.44–1.00)
GFR calc non Af Amer: 46 mL/min — ABNORMAL LOW (ref >60)
GFR, EST AFRICAN AMERICAN: 54 mL/min — AB (ref >60)
GLUCOSE: 154 mg/dL — AB (ref 65–99)
POTASSIUM: 4.4 mmol/L (ref 3.5–5.1)
SODIUM: 136 mmol/L (ref 135–145)
Total Bilirubin: 0.8 mg/dL (ref 0.3–1.2)
Total Protein: 5.9 g/dL — ABNORMAL LOW (ref 6.5–8.1)

## 2015-06-13 LAB — GLUCOSE, SEROUS FLUID: GLUCOSE FL: 133 mg/dL

## 2015-06-13 LAB — BODY FLUID CELL COUNT WITH DIFFERENTIAL
Lymphs, Fluid: 7 %
MONOCYTE-MACROPHAGE-SEROUS FLUID: 55 % (ref 50–90)
NEUTROPHIL FLUID: 38 % — AB (ref 0–25)
WBC FLUID: UNDETERMINED uL (ref 0–1000)

## 2015-06-13 LAB — GRAM STAIN

## 2015-06-13 LAB — LACTATE DEHYDROGENASE, PLEURAL OR PERITONEAL FLUID: LD FL: 53 U/L — AB (ref 3–23)

## 2015-06-13 LAB — PROTEIN, BODY FLUID: Total protein, fluid: <3 g/dL

## 2015-06-13 LAB — PROTIME-INR
INR: 1.88 — ABNORMAL HIGH (ref 0.00–1.49)
PROTHROMBIN TIME: 21.5 s — AB (ref 11.6–15.2)

## 2015-06-13 LAB — MAGNESIUM: MAGNESIUM: 2.1 mg/dL (ref 1.7–2.4)

## 2015-06-13 LAB — PHOSPHORUS: Phosphorus: 2.4 mg/dL — ABNORMAL LOW (ref 2.5–4.6)

## 2015-06-13 LAB — LACTATE DEHYDROGENASE: LDH: 168 U/L (ref 98–192)

## 2015-06-13 MED ORDER — BUDESONIDE 0.5 MG/2ML IN SUSP
0.5000 mg | Freq: Two times a day (BID) | RESPIRATORY_TRACT | Status: DC
  Administered 2015-06-13 – 2015-06-19 (×11): 0.5 mg via RESPIRATORY_TRACT
  Filled 2015-06-13 (×11): qty 2

## 2015-06-13 MED ORDER — IPRATROPIUM-ALBUTEROL 0.5-2.5 (3) MG/3ML IN SOLN
3.0000 mL | Freq: Four times a day (QID) | RESPIRATORY_TRACT | Status: DC
  Administered 2015-06-13 – 2015-06-16 (×14): 3 mL via RESPIRATORY_TRACT
  Filled 2015-06-13 (×13): qty 3

## 2015-06-13 MED ORDER — VITAMIN K1 10 MG/ML IJ SOLN
5.0000 mg | Freq: Once | INTRAVENOUS | Status: AC
  Administered 2015-06-13: 5 mg via INTRAVENOUS
  Filled 2015-06-13: qty 0.5

## 2015-06-13 MED ORDER — FUROSEMIDE 10 MG/ML IJ SOLN
20.0000 mg | Freq: Once | INTRAMUSCULAR | Status: AC
  Administered 2015-06-13: 20 mg via INTRAVENOUS
  Filled 2015-06-13: qty 2

## 2015-06-13 NOTE — Progress Notes (Signed)
Nutrition Follow-up  DOCUMENTATION CODES:   Not applicable  INTERVENTION:  - Will d/c scheduled Ensure Enlive order and maintain PRN Ensure Enlive order - Diet advancement as medically feasible - RD will continue to monitor for needs  NUTRITION DIAGNOSIS:   Inadequate oral intake related to acute illness, nausea, vomiting as evidenced by per patient/family report. -ongoing with pt now NPO  GOAL:   Patient will meet greater than or equal to 90% of their needs -currently unmet with NPO status  MONITOR:   PO intake, Skin, Weight trends, Labs, I & O's  ASSESSMENT:   82 y.o. female who presents to the ED with c/o weakness. She has had 4 day history of cough, nausea, decreased PO intake, generalized weakness. All of this onset late last week as a URI which then moved down into her chest. She saw her PCP yesterday who did a CXR (which is available on our Epic system) which demonstrated BLL PNA), labs were drawn this morning which showed dehydration with K of 2.5, elevated BUN. Patient was sent to the ED. Daughter gave patient Gatorade in the mean time.  3/21 Pt currently NPO for thoracentesis and was out of the room this AM for the same. Per chart review, pt previously on Regular diet since 3/13 with documented intakes of 75% breakfast, 15% lunch, 50% dinner 3/18; 10% breakfast, 50% dinner 3/19; 75% breakfast 3/20. Pt has requested several times that Ensure Enlive not be provided as it causes her to have loose stools. Current order in place for Ensure Enlive BID as well as Ensure Enlive TID PRN; will remove scheduled order and maintain PRN order should pt decide she would like to consume this supplement in the future. Pt is lactose intolerant. Have previously discussed lactose-free supplements with her and pt has declined all of these.  Pt likely meeting minimal needs on average the past 3 days. Not currently meeting needs with NPO status. Will monitor for needs with diet advancement.  Medications reviewed. Labs reviewed; Cl: 96 mmol/L, BUN/creatinine elevated, GFR: 46.   3/16 - Per chart review, pt ate 40% of breakfast 3/13 and 60% of breakfast this AM. - Pt reports that appetite is somewhat improved today and that she ate "almost all of" eggs and bacon for breakfast. - Pt denies any chewing or swallowing issues during meals.  - SLP note from this AM indicates pt has a hx of mild esophageal dysmotility and that pt may benefit from esophagram.  - Plan previously for MBS but note 3/14 at 1827 indicates that MBS not be done.  - Pt continues to decline nutrition supplements or additional nutrition intervention.  - Pt may benefit from a snack between meals; will order.  - Ensure Enlive ordered TID PRN since last RD assessment. Pt had previously requested this item not be provided to her.    *Please see chart for RD notes from earlier in admission.   Diet Order:  Diet NPO time specified Except for: Sips with Meds  Skin:  Reviewed, no issues  Last BM:  3/20  Height:   Ht Readings from Last 1 Encounters:  05/30/15 5\' 4"  (1.626 m)    Weight:   Wt Readings from Last 1 Encounters:  06/13/15 168 lb 6.9 oz (76.4 kg)    Ideal Body Weight:  54.54 kg (kg)  BMI:  Body mass index is 28.9 kg/(m^2).  Estimated Nutritional Needs:   Kcal:  1350-1550  Protein:  55-65 grams  Fluid:  1.8-2 L/day  EDUCATION  NEEDS:   No education needs identified at this time     Jarome Matin, [place], [facility] Inpatient Clinical Dietitian Pager # [phone] After hours/weekend pager # [phone]

## 2015-06-13 NOTE — Progress Notes (Signed)
ANTICOAGULATION CONSULT NOTE - Follow up  Pharmacy Consult for coumadin Indication: atrial fibrillation  Allergies  Allergen Reactions  . Penicillins Anaphylaxis, Hives and Swelling       . Sulfa Antibiotics Anaphylaxis and Swelling    Patient Measurements: Height: 5\' 4"  (162.6 cm) Weight: 168 lb 6.9 oz (76.4 kg) IBW/kg (Calculated) : 54.7  Vital Signs: Temp: 98 F (36.7 C) (03/21 0555) Temp Source: Oral (03/21 0555) BP: 155/76 mmHg (03/21 0555) Pulse Rate: 74 (03/21 0555)  Labs:  Recent Labs  06/11/15 0614 06/12/15 0520 06/13/15 0544  HGB 9.8* 9.9* 10.0*  HCT 31.4* 31.2* 31.8*  PLT 257 240 245  LABPROT 25.8* 29.7* 21.5*  INR 2.39* 2.88* 1.88*  CREATININE 1.15* 1.07* 1.01*    Estimated Creatinine Clearance: 34.8 mL/min (by C-G formula based on Cr of 1.01).  Assessment: [age] yoF on coumadin PTA for Afib who was admitted on 3/7 for pneumonia.  - PTA Warfarin regimen = 3mg  daily except 2mg  MWF; last reported dose taken on 3/6 @ 21:00. INR upon admission = 1.85 (subtherapeutic). - Pharmacy consulted to continue dosing of warfarin.  Significant events: 3/20:  chest CT-  increasing bilateral effusions.  Plan for paracentesis on 3/21. Vitamin K 5mg  IV x1 given at 2200. Holding Coumadin per MD. 3/21: Vit K 5 mg IV x1 given at 0930  Today, 06/13/2015:  INR down 1.88 s/p vit K 5mg  x1 on 3/20.  Repeat Vitamin K 5 mg IV x1 this morning per MD  CBC: Hgb low but stable, Plt WNL  No bleeding documented  Diet: heart healthy, ~10- 50% meal intake charted.  Drug-drug interactions: Levaquin(from 3/6-3/14) likely contributed to elevated INR; No signif. Drug-drug intxns noted at this time    Goal of Therapy:  INR 2-3   Plan:   Hold Coumadin for procedure--> Please advise if/when you want us to resume anticoagulation for patient after procedure.  Daily PT/INR.  Monitor for signs and symptoms of bleeding.  Dorna LeitzAnh Jamaiyah Pyle, PharmD, BCPS 06/13/2015 9:40 AM

## 2015-06-13 NOTE — Consult Note (Signed)
Name: Tonya Farley MRN: [id number] DOB: January 07, 1920    ADMISSION DATE:  05/30/2015 CONSULTATION DATE:  3/13  REFERRING MD :  TH  CHIEF COMPLAINT:  Cough initially,  PCCM reconsulted for SOB, B effusion, HCAP.   BRIEF PATIENT DESCRIPTION: 82yo female with hx HTN, AFib on coumadin, CKD III admitted 3/7 with PNA, flu A.   She was treated with IV abx and improving slowly.  PCCM consulted 3/13 for ongoing cough.  Pt improved with regards to her cough. She was given Abx and tamiflu.  Slow to improved.  The last week -- noted to be more SOB.  Chest ct scan with new B infiltrates and more pleural effusion, compared to baselines.   SIGNIFICANT EVENTS  3/7 admit 3/13 PCCM consulted for cough -- coguh got better 3/21 PCCM re-consulted for worse SOB. CT scan with B PNA and B effusion. Had R thoracentesis.   STUDIES:  Chest CT scan (3/20)  increasing bilateral effusion, increasing infiltrates compared to chest CT scan done on March 15. 2Decho (3/12) LVEF 55%, diastolic dysfunction, left atrial enlargement, atrial fibrillation.  HISTORY OF PRESENT ILLNESS:  82yo female with hx HTN, AFib on coumadin, CKD III admitted 3/7 with PNA, flu A.   She was treated with IV abx and improving slowly.  PCCM consulted 3/13 for ongoing cough.     PAST MEDICAL HISTORY :   has a past medical history of Hypertension; Hyperlipidemia; Thyroid disease; Hypothyroidism; Chronic back pain; PONV (postoperative nausea and vomiting); Chronic a-fib (HCC); and A-fib (HCC).  has past surgical history that includes Appendectomy; Tonsillectomy; cataracts; and Eye surgery. Prior to Admission medications   Medication Sig Start Date End Date Taking? Authorizing Provider  alendronate (FOSAMAX) 70 MG tablet Take 70 mg by mouth once a week. Take with a full glass of water on an empty stomach.   Yes Historical Provider, MD  atorvastatin (LIPITOR) 10 MG tablet Take 10 mg by mouth daily.   Yes Historical Provider, MD  B Complex-C  (B-COMPLEX WITH VITAMIN C) tablet Take 1 tablet by mouth daily.   Yes Historical Provider, MD  brimonidine (ALPHAGAN) 0.15 % ophthalmic solution Place 1 drop into both eyes 3 (three) times daily.   Yes Historical Provider, MD  Cholecalciferol (VITAMIN D PO) Take by mouth.   Yes Historical Provider, MD  diltiazem (CARDIZEM CD) 360 MG 24 hr capsule Take 1 capsule (360 mg total) by mouth daily. 05/18/15  Yes Corky Crafts, MD  furosemide (LASIX) 40 MG tablet Take 40 mg by mouth daily.    Yes Historical Provider, MD  labetalol (NORMODYNE) 200 MG tablet Take 1 tablet (200 mg total) by mouth 2 (two) times daily. 04/12/15  Yes Corky Crafts, MD  latanoprost (XALATAN) 0.005 % ophthalmic solution Place 1 drop into both eyes at bedtime. 01/10/14  Yes Historical Provider, MD  levofloxacin (LEVAQUIN) 500 MG tablet Take 500 mg by mouth daily. 05/29/15  Yes Historical Provider, MD  levothyroxine (SYNTHROID, LEVOTHROID) 100 MCG tablet Take 100 mcg by mouth daily.   Yes Historical Provider, MD  Polyethyl Glycol-Propyl Glycol (SYSTANE OP) Apply 2 drops to eye daily as needed (dry eyes).    Yes Historical Provider, MD  potassium chloride SA (K-DUR,KLOR-CON) 20 MEQ tablet Take 20 mEq by mouth daily.   Yes Historical Provider, MD  timolol (TIMOPTIC) 0.5 % ophthalmic solution Place 1 drop into both eyes every morning.  12/28/13  Yes Historical Provider, MD  warfarin (COUMADIN) 1 MG tablet Take as directed  by coumadin clinic Patient taking differently: Take 2-3 mg by mouth See admin instructions. Take 3 mg once daily on Sunday, Tuesday, Thursday, and Saturday.  Take 2 mg once daily on Monday, Wednesday, and Friday. 12/15/14  Yes Corky Crafts, MD   Allergies  Allergen Reactions  . Penicillins Anaphylaxis, Hives and Swelling       . Sulfa Antibiotics Anaphylaxis and Swelling    FAMILY HISTORY:  family history includes Cancer in her mother; Heart attack in her father; Heart disease in her sister and  sister; Other in her father. SOCIAL HISTORY:  reports that she has never smoked. She has never used smokeless tobacco. She reports that she does not drink alcohol or use illicit drugs.  REVIEW OF SYSTEMS:   As per HPI - All other systems reviewed and were neg.  Pt states she has SOB -- little better than baseline/admission. On and off cough. (-) cp. (-) n/v/abd pain. (-) difficulty swallowing.    SUBJECTIVE:   VITAL SIGNS: Temp:  [98 F (36.7 C)-98.1 F (36.7 C)] 98 F (36.7 C) (03/21 0555) Pulse Rate:  [74-97] 74 (03/21 0555) Resp:  [22-24] 22 (03/21 0555) BP: (135-155)/(50-76) 139/51 mmHg (03/21 1046) SpO2:  [90 %-98 %] 90 % (03/21 1308) Weight:  [168 lb 6.9 oz (76.4 kg)] 168 lb 6.9 oz (76.4 kg) (03/21 1610)  PHYSICAL EXAMINATION: General:  Pleasant elderly female, in mild distress respiratory, sitting in chair Neuro:  Awake, alert, appropriate, MAE, gen weakness  HEENT:  Mm moist, no JVD  Cardiovascular:  s1s2 irreg  Lungs:  resps even mildly labored on [place], occasional mild rattling cough, diminished bases, few scattered rhonchi . Crackles bilaterally mid to bases. Abdomen:  Round, soft, non tender  Musculoskeletal:  Warm and dry, BUE symmetric edema, scant BLE edema    Recent Labs Lab 06/11/15 0614 06/12/15 0520 06/13/15 0544  NA 142 140 136  K 4.7 4.2 4.4  CL 100* 99* 96*  CO2 31 32 30  BUN 36* 36* 38*  CREATININE 1.15* 1.07* 1.01*  GLUCOSE 216* 160* 154*    Recent Labs Lab 06/11/15 0614 06/12/15 0520 06/13/15 0544  HGB 9.8* 9.9* 10.0*  HCT 31.4* 31.2* 31.8*  WBC 18.4* 19.1* 19.8*  PLT 257 240 245   Dg Chest 1 View  06/13/2015  CLINICAL DATA:  Status post thoracentesis EXAM: CHEST 1 VIEW COMPARISON:  June 11, 2015 chest radiograph and chest CT June 12, 2015 FINDINGS: There is no appreciable pneumothorax post right-sided thoracentesis. There is minimal residual pleural effusion on the right. There is moderate pleural effusion on the left with patchy  airspace consolidation throughout much of the left lung. Heart is enlarged with pulmonary vascularity within normal limits. There is atherosclerotic calcification in the aorta. No adenopathy is apparent. Bones are osteoporotic. IMPRESSION: No pneumothorax. Near complete resolution of right pleural effusion. Moderate pleural effusion on the left with airspace consolidation throughout much of the left lung. Stable cardiomegaly. Bones osteoporotic. Electronically Signed   By: Bretta Bang III M.D.   On: 06/13/2015 11:45   Ct Chest Wo Contrast  06/12/2015  CLINICAL DATA:  Cough and nausea for 4 days EXAM: CT CHEST WITHOUT CONTRAST TECHNIQUE: Multidetector CT imaging of the chest was performed following the standard protocol without IV contrast. COMPARISON:  06/07/2015 FINDINGS: Lungs are well aerated bilaterally with bilateral pleural effusions of a moderate degree. These have increased in the interval from the prior exam. Bilateral lower lobe consolidation is again identified. There are new  patchy infiltrative changes identified throughout both lungs. Some of these have a nodular component but given their acute nature are consistent with infiltrate. The thoracic inlet is within normal limits. Diffuse aortic calcifications are again identified. No significant hilar or mediastinal adenopathy is identified. There remains some dependent debris within the left bronchial tree likely related to some mucous. This is stable from the previous exam. The visualized upper abdomen and bony structures are stable. IMPRESSION: Increasing bilateral pleural effusions as well as increasing bilateral infiltrates. Continued follow-up is recommended. Electronically Signed   By: Alcide CleverMark  Lukens M.D.   On: 06/12/2015 17:00   [place] Thoracentesis Asp Pleural Space W/img Guide  06/13/2015  INDICATION: Two weeks ago was diagnosed with influenza. Readmitted for pneumonia. Patient noted to have a right-sided pleural effusion and a request has  been made for a diagnostic and therapeutic thoracentesis. EXAM: ULTRASOUND GUIDED DIAGNOSTIC AND THERAPEUTIC THORACENTESIS MEDICATIONS: 1% lidocaine COMPLICATIONS: None immediate. PROCEDURE: An ultrasound guided thoracentesis was thoroughly discussed with the patient and questions answered. The benefits, risks, alternatives and complications were also discussed. The patient understands and wishes to proceed with the procedure. Written consent was obtained. Ultrasound was performed to localize and mark an adequate pocket of fluid in the right chest. The area was then prepped and draped in the normal sterile fashion. 1% Lidocaine was used for local anesthesia. A Safe-T-Centesis catheter was introduced. Thoracentesis was performed. The catheter was removed and a dressing applied. FINDINGS: A total of approximately 0.36 L of yellow fluid was removed. Samples were sent to the laboratory as requested by the clinical team. IMPRESSION: Successful ultrasound guided right thoracentesis yielding 0.36 L of pleural fluid. Read by: Barnetta ChapelKelly Osborne, PA-C Electronically Signed   By: Malachy MoanHeath  McCullough M.D.   On: 06/13/2015 11:45    ASSESSMENT / PLAN: Pt with bilateral infiltrates, chest CT scan findings worse compared to baseline. Also with worse pleural effusion, bilateral. Patient is status post thoracentesis on the right. They were able to drain 300 ML's clear fluid. Considerations include the following: 1. Aspiration pneumonitis/PNA. Patient denies any difficulty swallowing but she states that food stays in her mouth. 2. Healthcare associated pneumonia. Possible staph or gram-negative pneumonia. Patient has been admitted since March 13. She is at risk for healthcare associated pneumonia. 3. Recent influenza. 4. Volume overload. Pulmonary edema. She stated liters positive since admission. 5. Diastolic dysfunction.  Plan : 1. Noted ID consult. Currently patient is on aztreonam and vancomycin. 2. We'll give Lasix, 20 mg  IV, 1. Check electrolytes. She may need more IV scheduled doses rather than by mouth. Continue diuresis. 3. I will hold off on thoracentesis on the left. We will reassess in the next couple days. 4. Continue DuoNeb 4 times a day. Start Pulmicort twice a day via neb. 5. Incentive spirometry. 6. Flutter valve. 7. Patient is DO NOT RESUSCITATE. 8. On Coumadin for atrial fibrillation. 9. Suggest swallow evaluation if it hasn't been done yet. She may need a modified barium swallow. We may need to modify her diet. I suspect she is aspirating causing recurrent pneumonia.  Pollie MeyerJ. Angelo A de Dios, MD 06/13/2015, 1:53 PM [facility] Pager [phone] After 3 pm or if no answer, call [phone]

## 2015-06-13 NOTE — Progress Notes (Signed)
Physical Therapy Treatment Patient Details Name: Tonya Farley MRN: [id number] DOB: 02/21/1920 Today's Date: 06/13/2015    History of Present Illness 82 yo female admitted with Pna. Hx of HTN, chronic back pain, A fib.     PT Comments    Pt OOB in recliner on 3 lts O2 at rest 96% and HR 82.  [facility].  Required assist to complete turn and VC's for proper hand placement.  Assisted with hygiene.  Amb twice 15 feet x 2 requiring one extended sitting rest break.  Remained on 3 lts nasal sats avg 94% and HR 107.  Limited activity tolerance.  Pt will need ST Rehab at SNF.  Follow Up Recommendations  SNF ([facility])     Equipment Recommendations  None recommended by PT    Recommendations for Other Services       Precautions / Restrictions Precautions Precautions: Fall Precaution Comments: monitor stats Restrictions Weight Bearing Restrictions: No    Mobility  Bed Mobility               General bed mobility comments: pt OOB in recliner  Transfers Overall transfer level: Needs assistance Equipment used: Rolling walker (2 wheeled) Transfers: Sit to/from Stand Sit to Stand: Min assist         General transfer comment: 50% VC's on proper hand placement to power up.  Increased time and 75% VC's safety with turns.   Ambulation/Gait Ambulation/Gait assistance: Min assist Ambulation Distance (Feet): 30 Feet (15' x 2) Assistive device: Rolling walker (2 wheeled) Gait Pattern/deviations: Step-to pattern;Trunk flexed Gait velocity: decreased   General Gait Details: 3rd assist following with chair due to fatigue/B LE weakness.  Remained on 3 lts O2 sats avg 94% with HR 107 with activity.  Limited activity tolerance and 3/4 DOE requiring extended rest breaks.     Stairs            Wheelchair Mobility    Modified Rankin (Stroke Patients Only)       Balance                                    Cognition  Arousal/Alertness: Awake/alert Behavior During Therapy: WFL for tasks assessed/performed Overall Cognitive Status: Within Functional Limits for tasks assessed                      Exercises      General Comments        Pertinent Vitals/Pain Pain Assessment: No/denies pain    Home Living                      Prior Function            PT Goals (current goals can now be found in the care plan section) Progress towards PT goals: Progressing toward goals    Frequency  Min 3X/week    PT Plan Current plan remains appropriate    Co-evaluation             End of Session Equipment Utilized During Treatment: Gait belt Activity Tolerance: Patient tolerated treatment well Patient left: in chair;with call bell/phone within reach;with chair alarm set;with family/visitor present     Time: 1440-1505 PT Time Calculation (min) (ACUTE ONLY): 25 min  Charges:  $Gait Training: 8-22 mins $Therapeutic Activity: 8-22 mins  G Codes:      Rica Koyanagi  PTA [facility]  Acute  Rehab Pager      [phone]

## 2015-06-13 NOTE — Consult Note (Addendum)
[facility] for Infectious Disease  Date of Admission:  05/30/2015  Date of Consult:  06/13/2015  Reason for Consult:Leukocytosis, influenza Referring Physician: Sanjuana Letters, pt's daughter  Impression/Recommendation Influenza HCAP Fluid overload   Consider thoracentesis on L Consider repeat CXR after thoracentesis Suspect her WBC is from steroids.  Suspect her continued cough is from her influenza Suspect her CXR findings are from her influenza.  There is no evidence of yet to suggest super-infection Will start to wean off anbx-  Her PEN allergy is HIVES, not anaphylaxis. This occured while she was working and she got a "shot" and was able to complete her day of work.  RUE doppler (-) for DVT  discussed with her family in room, who agree.   Thank you for theconsult,   Tonya Farley (pager) [phone] [email or web address]  Tonya Farley is an 82 y.o. female.  HPI: 82 yo F with hx CKD 3 and A-fib, comes to hospital with complaints of decreased PO/weakness, cough. She was seen by her PCP day prior and told she had bilateral PNA. She came to ED 3-7 when she was found to have K+ of 2.5. She was started on levaquin. Her flu panel was positive for influenza A but she was not started on tamiflu on adm due to sx > 5 days.  She was also noted to have +troponin in hospital. She had swallow test on 3-12 (mild aspiration risk). Her TTE showed intermediate diastolic dysfunction, severe LAE, severe pulm HTN, EF 55%. Due to persistent cough she was seen by CCM on 3-13 and was felt to have prolonged cough due to influenza. She was given tessalon perles. She completed levaquin 3-14.  She underwent CT chest 3-15: Patchy hazy peripheral opacification and reticulonodular opacification over the upper lobes which may be due to a pneumonia versus atypical infection or inflammatory process. Small bilateral pleural effusions with bibasilar atelectasis. Dependent debris within the left lower lobe  bronchus which may be due to aspirated material and raises possibility of aspiration pneumonia. She was started on steroids on 3-15 (solumedrol) She was started on tamiflu, clinda dose was increased on 3-16. Her WBC had increased to 15.5 but she had been started on steroids.  By 3-17 her WBC continued to increase, she was started on vanco and given lasix due to fluid overload.  By 3-19 her vanco/clinda/tamiflu were stopped.  By 3-20 she had worsening WBC, however she felt better. She had repeat CT chest: showing worsening infiltrates and effusions.  She was started on vanco/aztreonam/flagyl.  Today she was re-evaluated by CCM, underwent thoracentesis of 350m. (transudate)   Past Medical History  Diagnosis Date  . Hypertension   . Hyperlipidemia   . Thyroid disease   . Hypothyroidism   . Chronic back pain   . PONV (postoperative nausea and vomiting)   . Chronic a-fib ([place]   . A-fib ([facility]     Past Surgical History  Procedure Laterality Date  . Appendectomy    . Tonsillectomy    . Cataracts      bilateral  . Eye surgery      left eye "hole"     Allergies  Allergen Reactions  . Penicillins Anaphylaxis, Hives and Swelling       . Sulfa Antibiotics Anaphylaxis and Swelling    Medications:  Scheduled: . antiseptic oral rinse  7 mL Mouth Rinse BID  . atorvastatin  10 mg Oral Daily  . aztreonam  2 g Intravenous 3 times per day  .  brimonidine  1 drop Both Eyes TID  . budesonide (PULMICORT) nebulizer solution  0.5 mg Nebulization BID  . diltiazem  360 mg Oral Daily  . furosemide  20 mg Oral Daily  . guaiFENesin  1,200 mg Oral BID  . ipratropium-albuterol  3 mL Nebulization QID  . labetalol  200 mg Oral BID  . latanoprost  1 drop Both Eyes QHS  . levothyroxine  100 mcg Oral QAC breakfast  . metronidazole  500 mg Intravenous 3 times per day  . multivitamin with minerals  1 tablet Oral Daily  . pantoprazole  40 mg Oral Daily  . predniSONE  10 mg Oral Q breakfast  .  timolol  1 drop Both Eyes q morning - 10a  . vancomycin  1,000 mg Intravenous QHS    Abtx:  Anti-infectives    Start     Dose/Rate Route Frequency Ordered Stop   06/12/15 2200  aztreonam (AZACTAM) 2 g in dextrose 5 % 50 mL IVPB     2 g 100 mL/hr over 30 Minutes Intravenous 3 times per day 06/12/15 2106     06/12/15 2100  metroNIDAZOLE (FLAGYL) IVPB 500 mg     500 mg 100 mL/hr over 60 Minutes Intravenous 3 times per day 06/12/15 2043     06/12/15 2100  vancomycin (VANCOCIN) IVPB 1000 mg/200 mL premix     1,000 mg 200 mL/hr over 60 Minutes Intravenous Daily at bedtime 06/12/15 2054     06/10/15 0000  clindamycin (CLEOCIN) IVPB 600 mg  Status:  Discontinued     600 mg 100 mL/hr over 30 Minutes Intravenous Every 8 hours 06/09/15 1724 06/11/15 1311   06/09/15 1200  vancomycin (VANCOCIN) IVPB 1000 mg/200 mL premix  Status:  Discontinued     1,000 mg 200 mL/hr over 60 Minutes Intravenous Every 24 hours 06/09/15 0947 06/11/15 1311   06/08/15 1600  clindamycin (CLEOCIN) IVPB 900 mg  Status:  Discontinued     900 mg 100 mL/hr over 30 Minutes Intravenous Every 8 hours 06/08/15 1452 06/09/15 1724   06/07/15 1700  clindamycin (CLEOCIN) IVPB 600 mg  Status:  Discontinued     600 mg 100 mL/hr over 30 Minutes Intravenous Every 8 hours 06/07/15 1533 06/08/15 1452   06/07/15 1545  oseltamivir (TAMIFLU) capsule 30 mg  Status:  Discontinued     30 mg Oral 2 times daily 06/07/15 1533 06/11/15 1313   06/05/15 2200  levofloxacin (LEVAQUIN) tablet 750 mg  Status:  Discontinued     750 mg Oral Every 48 hours 06/05/15 0900 06/06/15 1223   06/01/15 2200  levofloxacin (LEVAQUIN) IVPB 750 mg  Status:  Discontinued     750 mg 100 mL/hr over 90 Minutes Intravenous Every 48 hours 05/31/15 0747 06/05/15 0859   05/30/15 2130  levofloxacin (LEVAQUIN) IVPB 750 mg  Status:  Discontinued     750 mg 100 mL/hr over 90 Minutes Intravenous Every 24 hours 05/30/15 2120 05/31/15 0747   05/30/15 2115  levofloxacin  (LEVAQUIN) tablet 500 mg  Status:  Discontinued     500 mg Oral Daily 05/30/15 2103 05/30/15 2104   05/30/15 2115  cefTRIAXone (ROCEPHIN) 1 g in dextrose 5 % 50 mL IVPB  Status:  Discontinued     1 g 100 mL/hr over 30 Minutes Intravenous Every 24 hours 05/30/15 2108 05/30/15 2118   05/30/15 2115  azithromycin (ZITHROMAX) tablet 500 mg  Status:  Discontinued     500 mg Oral Every 24 hours 05/30/15 2108 05/30/15  2118      Total days of antibiotics: 14 3-7 levaquin 3-14 3-15 tamiflu 3-19 3-15 clinda 3-19 3-17 vanco  3-20 aztreonam 3-20 flagyl         Social History:  reports that she has never smoked. She has never used smokeless tobacco. She reports that she does not drink alcohol or use illicit drugs.  Family History  Problem Relation Age of Onset  . Heart disease Sister   . Heart disease Sister   . Cancer Mother   . Other Father   . Heart attack Father     General ROS: no  diarrhea, no dysphagia, continued cough, non-prod, no dysuria, + UE edema. see HPI  Blood pressure 139/51, pulse 74, temperature 98 F (36.7 C), temperature source Oral, resp. rate 22, height '5\' 4"'  (1.626 m), weight 76.4 kg (168 lb 6.9 oz), SpO2 90 %. General appearance: alert, cooperative and no distress Eyes: negative findings: conjunctivae and sclerae normal and pupils equal, round, reactive to light and accomodation Throat: normal findings: oropharynx pink & moist without lesions or evidence of thrush Neck: no adenopathy and supple, symmetrical, trachea midline Lungs: rhonchi bilaterally Heart: regular rate and rhythm Abdomen: normal findings: bowel sounds normal and soft, non-tender Extremities: edema anasarca, worse RUE   Results for orders placed or performed during the hospital encounter of 05/30/15 (from the past 48 hour(s))  Protime-INR     Status: Abnormal   Collection Time: 06/12/15  5:20 AM  Result Value Ref Range   Prothrombin Time 29.7 (H) 11.6 - 15.2 seconds   INR 2.88 (H) 0.00 - 1.49   Comprehensive metabolic panel     Status: Abnormal   Collection Time: 06/12/15  5:20 AM  Result Value Ref Range   Sodium 140 135 - 145 mmol/L   Potassium 4.2 3.5 - 5.1 mmol/L   Chloride 99 (L) 101 - 111 mmol/L   CO2 32 22 - 32 mmol/L   Glucose, Bld 160 (H) 65 - 99 mg/dL   BUN 36 (H) 6 - 20 mg/dL   Creatinine, Ser 1.07 (H) 0.44 - 1.00 mg/dL   Calcium 9.0 8.9 - 10.3 mg/dL   Total Protein 6.1 (L) 6.5 - 8.1 g/dL   Albumin 2.8 (L) 3.5 - 5.0 g/dL   AST 11 (L) 15 - 41 U/L   ALT 12 (L) 14 - 54 U/L   Alkaline Phosphatase 51 38 - 126 U/L   Total Bilirubin 0.7 0.3 - 1.2 mg/dL   GFR calc non Af Amer 43 (L) >60 mL/min   GFR calc Af Amer 50 (L) >60 mL/min    Comment: (NOTE) The eGFR has been calculated using the CKD EPI equation. This calculation has not been validated in all clinical situations. eGFR's persistently <60 mL/min signify possible Chronic Kidney Disease.    Anion gap 9 5 - 15  CBC with Differential/Platelet     Status: Abnormal   Collection Time: 06/12/15  5:20 AM  Result Value Ref Range   WBC 19.1 (H) 4.0 - 10.5 K/uL   RBC 3.59 (L) 3.87 - 5.11 MIL/uL   Hemoglobin 9.9 (L) 12.0 - 15.0 g/dL   HCT 31.2 (L) 36.0 - 46.0 %   MCV 86.9 78.0 - 100.0 fL   MCH 27.6 26.0 - 34.0 pg   MCHC 31.7 30.0 - 36.0 g/dL   RDW 17.2 (H) 11.5 - 15.5 %   Platelets 240 150 - 400 K/uL   Neutrophils Relative % 88 %   Neutro Abs 16.9 (  H) 1.7 - 7.7 K/uL   Lymphocytes Relative 3 %   Lymphs Abs 0.5 (L) 0.7 - 4.0 K/uL   Monocytes Relative 9 %   Monocytes Absolute 1.7 (H) 0.1 - 1.0 K/uL   Eosinophils Relative 0 %   Eosinophils Absolute 0.0 0.0 - 0.7 K/uL   Basophils Relative 0 %   Basophils Absolute 0.0 0.0 - 0.1 K/uL  Protime-INR     Status: Abnormal   Collection Time: 06/13/15  5:44 AM  Result Value Ref Range   Prothrombin Time 21.5 (H) 11.6 - 15.2 seconds   INR 1.88 (H) 0.00 - 1.49  CBC with Differential/Platelet     Status: Abnormal   Collection Time: 06/13/15  5:44 AM  Result Value Ref Range    WBC 19.8 (H) 4.0 - 10.5 K/uL   RBC 3.67 (L) 3.87 - 5.11 MIL/uL   Hemoglobin 10.0 (L) 12.0 - 15.0 g/dL   HCT 31.8 (L) 36.0 - 46.0 %   MCV 86.6 78.0 - 100.0 fL   MCH 27.2 26.0 - 34.0 pg   MCHC 31.4 30.0 - 36.0 g/dL   RDW 17.0 (H) 11.5 - 15.5 %   Platelets 245 150 - 400 K/uL   Neutrophils Relative % 88 %   Neutro Abs 17.4 (H) 1.7 - 7.7 K/uL   Lymphocytes Relative 3 %   Lymphs Abs 0.6 (L) 0.7 - 4.0 K/uL   Monocytes Relative 9 %   Monocytes Absolute 1.8 (H) 0.1 - 1.0 K/uL   Eosinophils Relative 0 %   Eosinophils Absolute 0.0 0.0 - 0.7 K/uL   Basophils Relative 0 %   Basophils Absolute 0.0 0.0 - 0.1 K/uL  Comprehensive metabolic panel     Status: Abnormal   Collection Time: 06/13/15  5:44 AM  Result Value Ref Range   Sodium 136 135 - 145 mmol/L   Potassium 4.4 3.5 - 5.1 mmol/L   Chloride 96 (L) 101 - 111 mmol/L   CO2 30 22 - 32 mmol/L   Glucose, Bld 154 (H) 65 - 99 mg/dL   BUN 38 (H) 6 - 20 mg/dL   Creatinine, Ser 1.01 (H) 0.44 - 1.00 mg/dL   Calcium 8.9 8.9 - 10.3 mg/dL   Total Protein 5.9 (L) 6.5 - 8.1 g/dL   Albumin 2.6 (L) 3.5 - 5.0 g/dL   AST 13 (L) 15 - 41 U/L   ALT 12 (L) 14 - 54 U/L   Alkaline Phosphatase 49 38 - 126 U/L   Total Bilirubin 0.8 0.3 - 1.2 mg/dL   GFR calc non Af Amer 46 (L) >60 mL/min   GFR calc Af Amer 54 (L) >60 mL/min    Comment: (NOTE) The eGFR has been calculated using the CKD EPI equation. This calculation has not been validated in all clinical situations. eGFR's persistently <60 mL/min signify possible Chronic Kidney Disease.    Anion gap 10 5 - 15  Lactate dehydrogenase     Status: None   Collection Time: 06/13/15  5:44 AM  Result Value Ref Range   LDH 168 98 - 192 U/L  Lactate dehydrogenase (CSF, pleural or peritoneal fluid)     Status: Abnormal   Collection Time: 06/13/15 11:05 AM  Result Value Ref Range   LD, Fluid 53 (H) 3 - 23 U/L    Comment: (NOTE) Results should be evaluated in conjunction with serum values Performed at [facility]    Fluid Type-FLDH Pleural R   Protein, pleural or peritoneal fluid  Status: None   Collection Time: 06/13/15 11:05 AM  Result Value Ref Range   Total protein, fluid <3.0 g/dL    Comment: (NOTE) No normal range established for this test Results should be evaluated in conjunction with serum values Performed at [facility]    Fluid Type-FTP Pleural R   Body fluid cell count with differential     Status: Abnormal   Collection Time: 06/13/15 11:05 AM  Result Value Ref Range   Fluid Type-FCT Pleural R    Color, Fluid YELLOW YELLOW   Appearance, Fluid CLEAR (A) CLEAR   WBC, Fluid UNABLE TO PERFORM COUNT DUE TO CLOT IN SPECIMEN 0 - 1000 cu mm   Neutrophil Count, Fluid 38 (H) 0 - 25 %   Lymphs, Fluid 7 %   Monocyte-Macrophage-Serous Fluid 55 50 - 90 %   Other Cells, Fluid CORRELATE WITH CYTOLOGY. %  Glucose, pleural or peritoneal fluid     Status: None   Collection Time: 06/13/15 11:05 AM  Result Value Ref Range   Glucose, Fluid 133 mg/dL    Comment: (NOTE) No normal range established for this test Results should be evaluated in conjunction with serum values Performed at [facility]    Fluid Type-FGLU Pleural R   Gram stain     Status: None   Collection Time: 06/13/15 11:05 AM  Result Value Ref Range   Specimen Description FLUID RIGHT PLEURAL    Special Requests NONE Performed at [facility]     Gram Stain      FEW WBC [place] PMN AND MONONUCLEAR NO ORGANISMS SEEN    Report Status 06/13/2015 FINAL   Magnesium     Status: None   Collection Time: 06/13/15  1:00 PM  Result Value Ref Range   Magnesium 2.1 1.7 - 2.4 mg/dL  Phosphorus     Status: Abnormal   Collection Time: 06/13/15  1:00 PM  Result Value Ref Range   Phosphorus 2.4 (L) 2.5 - 4.6 mg/dL      Component Value Date/Time   SDES FLUID RIGHT PLEURAL 06/13/2015 1105   SPECREQUEST NONE Performed at [facility]  06/13/2015 1105   CULT  06/09/2015 1014     MULTIPLE SPECIES PRESENT, SUGGEST RECOLLECTION Performed at [place] 06/13/2015 FINAL 06/13/2015 1105   Dg Chest 1 View  06/13/2015  CLINICAL DATA:  Status post thoracentesis EXAM: CHEST 1 VIEW COMPARISON:  June 11, 2015 chest radiograph and chest CT June 12, 2015 FINDINGS: There is no appreciable pneumothorax post right-sided thoracentesis. There is minimal residual pleural effusion on the right. There is moderate pleural effusion on the left with patchy airspace consolidation throughout much of the left lung. Heart is enlarged with pulmonary vascularity within normal limits. There is atherosclerotic calcification in the aorta. No adenopathy is apparent. Bones are osteoporotic. IMPRESSION: No pneumothorax. Near complete resolution of right pleural effusion. Moderate pleural effusion on the left with airspace consolidation throughout much of the left lung. Stable cardiomegaly. Bones osteoporotic. Electronically Signed   By: Lowella Grip III M.D.   On: 06/13/2015 11:45   Ct Chest Wo Contrast  06/12/2015  CLINICAL DATA:  Cough and nausea for 4 days EXAM: CT CHEST WITHOUT CONTRAST TECHNIQUE: Multidetector CT imaging of the chest was performed following the standard protocol without IV contrast. COMPARISON:  06/07/2015 FINDINGS: Lungs are well aerated bilaterally with bilateral pleural effusions of a moderate degree. These have increased in the interval from the prior exam.  Bilateral lower lobe consolidation is again identified. There are new patchy infiltrative changes identified throughout both lungs. Some of these have a nodular component but given their acute nature are consistent with infiltrate. The thoracic inlet is within normal limits. Diffuse aortic calcifications are again identified. No significant hilar or mediastinal adenopathy is identified. There remains some dependent debris within the left bronchial tree likely related to some mucous. This is stable from the  previous exam. The visualized upper abdomen and bony structures are stable. IMPRESSION: Increasing bilateral pleural effusions as well as increasing bilateral infiltrates. Continued follow-up is recommended. Electronically Signed   By: Inez Catalina M.D.   On: 06/12/2015 17:00   US Thoracentesis Asp Pleural Space W/img Guide  06/13/2015  INDICATION: Two weeks ago was diagnosed with influenza. Readmitted for pneumonia. Patient noted to have a right-sided pleural effusion and a request has been made for a diagnostic and therapeutic thoracentesis. EXAM: ULTRASOUND GUIDED DIAGNOSTIC AND THERAPEUTIC THORACENTESIS MEDICATIONS: 1% lidocaine COMPLICATIONS: None immediate. PROCEDURE: An ultrasound guided thoracentesis was thoroughly discussed with the patient and questions answered. The benefits, risks, alternatives and complications were also discussed. The patient understands and wishes to proceed with the procedure. Written consent was obtained. Ultrasound was performed to localize and mark an adequate pocket of fluid in the right chest. The area was then prepped and draped in the normal sterile fashion. 1% Lidocaine was used for local anesthesia. A Safe-T-Centesis catheter was introduced. Thoracentesis was performed. The catheter was removed and a dressing applied. FINDINGS: A total of approximately 0.36 L of yellow fluid was removed. Samples were sent to the laboratory as requested by the clinical team. IMPRESSION: Successful ultrasound guided right thoracentesis yielding 0.36 L of pleural fluid. Read by: Saverio Danker, PA-C Electronically Signed   By: Jacqulynn Cadet M.D.   On: 06/13/2015 11:45   Recent Results (from the past 240 hour(s))  Culture, Urine     Status: None   Collection Time: 06/09/15 10:14 AM  Result Value Ref Range Status   Specimen Description URINE, CLEAN CATCH  Final   Special Requests NONE  Final   Culture   Final    MULTIPLE SPECIES PRESENT, SUGGEST RECOLLECTION Performed at [facility]    Report Status 06/10/2015 FINAL  Final  Gram stain     Status: None   Collection Time: 06/13/15 11:05 AM  Result Value Ref Range Status   Specimen Description FLUID RIGHT PLEURAL  Final   Special Requests NONE Performed at [facility]   Final   Gram Stain   Final    FEW WBC PRESENT,BOTH PMN AND MONONUCLEAR NO ORGANISMS SEEN    Report Status 06/13/2015 FINAL  Final      06/13/2015, 3:46 PM     LOS: 14 days    Records and images were personally reviewed where available.

## 2015-06-13 NOTE — Procedures (Signed)
Ultrasound-guided diagnostic and therapeutic right thoracentesis performed yielding 0.36 liters of yellow colored fluid. No immediate complications. Follow-up chest x-ray pending.       Celeste Candelas E 11:41 AM 06/13/2015

## 2015-06-14 DIAGNOSIS — J1100 Influenza due to unidentified influenza virus with unspecified type of pneumonia: Secondary | ICD-10-CM

## 2015-06-14 DIAGNOSIS — N179 Acute kidney failure, unspecified: Secondary | ICD-10-CM

## 2015-06-14 DIAGNOSIS — I482 Chronic atrial fibrillation: Secondary | ICD-10-CM

## 2015-06-14 DIAGNOSIS — R0989 Other specified symptoms and signs involving the circulatory and respiratory systems: Secondary | ICD-10-CM | POA: Insufficient documentation

## 2015-06-14 DIAGNOSIS — R059 Cough, unspecified: Secondary | ICD-10-CM | POA: Insufficient documentation

## 2015-06-14 DIAGNOSIS — R05 Cough: Secondary | ICD-10-CM | POA: Insufficient documentation

## 2015-06-14 DIAGNOSIS — R0602 Shortness of breath: Secondary | ICD-10-CM | POA: Insufficient documentation

## 2015-06-14 DIAGNOSIS — E785 Hyperlipidemia, unspecified: Secondary | ICD-10-CM

## 2015-06-14 DIAGNOSIS — R609 Edema, unspecified: Secondary | ICD-10-CM

## 2015-06-14 DIAGNOSIS — J189 Pneumonia, unspecified organism: Secondary | ICD-10-CM | POA: Insufficient documentation

## 2015-06-14 DIAGNOSIS — J09X2 Influenza due to identified novel influenza A virus with other respiratory manifestations: Secondary | ICD-10-CM

## 2015-06-14 DIAGNOSIS — I5022 Chronic systolic (congestive) heart failure: Secondary | ICD-10-CM | POA: Insufficient documentation

## 2015-06-14 LAB — CBC WITH DIFFERENTIAL/PLATELET
BASOS PCT: 0 %
Basophils Absolute: 0 10*3/uL (ref 0.0–0.1)
EOS ABS: 0 10*3/uL (ref 0.0–0.7)
EOS PCT: 0 %
HCT: 32.5 % — ABNORMAL LOW (ref 36.0–46.0)
HEMOGLOBIN: 10.8 g/dL — AB (ref 12.0–15.0)
LYMPHS PCT: 4 %
Lymphs Abs: 0.8 10*3/uL (ref 0.7–4.0)
MCH: 27.5 pg (ref 26.0–34.0)
MCHC: 33.2 g/dL (ref 30.0–36.0)
MCV: 82.7 fL (ref 78.0–100.0)
Monocytes Absolute: 1.7 10*3/uL — ABNORMAL HIGH (ref 0.1–1.0)
Monocytes Relative: 9 %
NEUTROS PCT: 87 %
Neutro Abs: 16.7 10*3/uL — ABNORMAL HIGH (ref 1.7–7.7)
PLATELETS: 272 10*3/uL (ref 150–400)
RBC: 3.93 MIL/uL (ref 3.87–5.11)
RDW: 17 % — ABNORMAL HIGH (ref 11.5–15.5)
WBC: 19.2 10*3/uL — AB (ref 4.0–10.5)

## 2015-06-14 LAB — AMYLASE, PLEURAL FLUID: AMYLASE, PLEURAL FLUID: 7 U/L

## 2015-06-14 LAB — COMPREHENSIVE METABOLIC PANEL
ALK PHOS: 55 U/L (ref 38–126)
ALT: 11 U/L — AB (ref 14–54)
AST: 14 U/L — AB (ref 15–41)
Albumin: 2.5 g/dL — ABNORMAL LOW (ref 3.5–5.0)
Anion gap: 8 (ref 5–15)
BUN: 40 mg/dL — AB (ref 6–20)
CALCIUM: 9.1 mg/dL (ref 8.9–10.3)
CHLORIDE: 98 mmol/L — AB (ref 101–111)
CO2: 32 mmol/L (ref 22–32)
CREATININE: 1.04 mg/dL — AB (ref 0.44–1.00)
GFR calc non Af Amer: 45 mL/min — ABNORMAL LOW (ref >60)
GFR, EST AFRICAN AMERICAN: 52 mL/min — AB (ref >60)
GLUCOSE: 100 mg/dL — AB (ref 65–99)
Potassium: 4.3 mmol/L (ref 3.5–5.1)
SODIUM: 138 mmol/L (ref 135–145)
Total Bilirubin: 1 mg/dL (ref 0.3–1.2)
Total Protein: 5.8 g/dL — ABNORMAL LOW (ref 6.5–8.1)

## 2015-06-14 LAB — PROTIME-INR
INR: 1.53 — ABNORMAL HIGH (ref 0.00–1.49)
PROTHROMBIN TIME: 18.4 s — AB (ref 11.6–15.2)

## 2015-06-14 LAB — BRAIN NATRIURETIC PEPTIDE: B Natriuretic Peptide: 365.3 pg/mL — ABNORMAL HIGH (ref 0.0–100.0)

## 2015-06-14 MED ORDER — FUROSEMIDE 10 MG/ML IJ SOLN
30.0000 mg | Freq: Every day | INTRAMUSCULAR | Status: DC
  Administered 2015-06-14 – 2015-06-15 (×2): 30 mg via INTRAVENOUS
  Filled 2015-06-14 (×2): qty 4

## 2015-06-14 MED ORDER — WARFARIN SODIUM 3 MG PO TABS
3.0000 mg | ORAL_TABLET | ORAL | Status: AC
  Administered 2015-06-14: 3 mg via ORAL
  Filled 2015-06-14: qty 1

## 2015-06-14 MED ORDER — WARFARIN - PHARMACIST DOSING INPATIENT: Freq: Every day | Status: DC

## 2015-06-14 MED ORDER — FUROSEMIDE 10 MG/ML IJ SOLN: 20.0000 mg | Freq: Every day | INTRAMUSCULAR | Status: DC

## 2015-06-14 NOTE — Progress Notes (Addendum)
INFECTIOUS DISEASE PROGRESS NOTE  ID: Tonya Farley is a 82 y.o. female with  Principal Problem:   CAP (community acquired pneumonia) Active Problems:   Atrial fibrillation (HCC)   Pleural effusion, bacterial   Dyspnea   AKI (acute kidney injury) (HCC)   Influenza due to identified novel influenza A virus with other respiratory manifestations   Cough   HCAP (healthcare-associated pneumonia)  Subjective: Without complaints.  Cont cough, unable to produce sputum.   Abtx:  Anti-infectives    Start     Dose/Rate Route Frequency Ordered Stop   06/12/15 2200  aztreonam (AZACTAM) 2 g in dextrose 5 % 50 mL IVPB     2 g 100 mL/hr over 30 Minutes Intravenous 3 times per day 06/12/15 2106     06/12/15 2100  metroNIDAZOLE (FLAGYL) IVPB 500 mg  Status:  Discontinued     500 mg 100 mL/hr over 60 Minutes Intravenous 3 times per day 06/12/15 2043 06/13/15 1629   06/12/15 2100  vancomycin (VANCOCIN) IVPB 1000 mg/200 mL premix     1,000 mg 200 mL/hr over 60 Minutes Intravenous Daily at bedtime 06/12/15 2054     06/10/15 0000  clindamycin (CLEOCIN) IVPB 600 mg  Status:  Discontinued     600 mg 100 mL/hr over 30 Minutes Intravenous Every 8 hours 06/09/15 1724 06/11/15 1311   06/09/15 1200  vancomycin (VANCOCIN) IVPB 1000 mg/200 mL premix  Status:  Discontinued     1,000 mg 200 mL/hr over 60 Minutes Intravenous Every 24 hours 06/09/15 0947 06/11/15 1311   06/08/15 1600  clindamycin (CLEOCIN) IVPB 900 mg  Status:  Discontinued     900 mg 100 mL/hr over 30 Minutes Intravenous Every 8 hours 06/08/15 1452 06/09/15 1724   06/07/15 1700  clindamycin (CLEOCIN) IVPB 600 mg  Status:  Discontinued     600 mg 100 mL/hr over 30 Minutes Intravenous Every 8 hours 06/07/15 1533 06/08/15 1452   06/07/15 1545  oseltamivir (TAMIFLU) capsule 30 mg  Status:  Discontinued     30 mg Oral 2 times daily 06/07/15 1533 06/11/15 1313   06/05/15 2200  levofloxacin (LEVAQUIN) tablet 750 mg  Status:  Discontinued      750 mg Oral Every 48 hours 06/05/15 0900 06/06/15 1223   06/01/15 2200  levofloxacin (LEVAQUIN) IVPB 750 mg  Status:  Discontinued     750 mg 100 mL/hr over 90 Minutes Intravenous Every 48 hours 05/31/15 0747 06/05/15 0859   05/30/15 2130  levofloxacin (LEVAQUIN) IVPB 750 mg  Status:  Discontinued     750 mg 100 mL/hr over 90 Minutes Intravenous Every 24 hours 05/30/15 2120 05/31/15 0747   05/30/15 2115  levofloxacin (LEVAQUIN) tablet 500 mg  Status:  Discontinued     500 mg Oral Daily 05/30/15 2103 05/30/15 2104   05/30/15 2115  cefTRIAXone (ROCEPHIN) 1 g in dextrose 5 % 50 mL IVPB  Status:  Discontinued     1 g 100 mL/hr over 30 Minutes Intravenous Every 24 hours 05/30/15 2108 05/30/15 2118   05/30/15 2115  azithromycin (ZITHROMAX) tablet 500 mg  Status:  Discontinued     500 mg Oral Every 24 hours 05/30/15 2108 05/30/15 2118      Medications:  Scheduled: . antiseptic oral rinse  7 mL Mouth Rinse BID  . atorvastatin  10 mg Oral Daily  . aztreonam  2 g Intravenous 3 times per day  . brimonidine  1 drop Both Eyes TID  . budesonide (PULMICORT) nebulizer  solution  0.5 mg Nebulization BID  . diltiazem  360 mg Oral Daily  . furosemide  30 mg Intravenous Daily  . guaiFENesin  1,200 mg Oral BID  . ipratropium-albuterol  3 mL Nebulization QID  . labetalol  200 mg Oral BID  . latanoprost  1 drop Both Eyes QHS  . levothyroxine  100 mcg Oral QAC breakfast  . multivitamin with minerals  1 tablet Oral Daily  . pantoprazole  40 mg Oral Daily  . predniSONE  10 mg Oral Q breakfast  . timolol  1 drop Both Eyes q morning - 10a  . vancomycin  1,000 mg Intravenous QHS  . Warfarin - Pharmacist Dosing Inpatient   Does not apply q1800    Objective: Vital signs in last 24 hours: Temp:  [98 F (36.7 C)-98.4 F (36.9 C)] 98.4 F (36.9 C) (03/22 1352) Pulse Rate:  [76-87] 81 (03/22 1352) Resp:  [20-24] 20 (03/22 1352) BP: (126-147)/(55-82) 126/55 mmHg (03/22 1352) SpO2:  [91 %-100 %] 100 %  (03/22 1352) Weight:  [75.479 kg (166 lb 6.4 oz)] 75.479 kg (166 lb 6.4 oz) (03/22 0459)   General appearance: alert, cooperative and no distress Resp: rhonchi bilaterally Cardio: regular rate and rhythm GI: normal findings: bowel sounds normal and soft, non-tender Extremities: edema anasarca UE  Lab Results  Recent Labs  06/13/15 0544 06/14/15 0518  WBC 19.8* 19.2*  HGB 10.0* 10.8*  HCT 31.8* 32.5*  NA 136 138  K 4.4 4.3  CL 96* 98*  CO2 30 32  BUN 38* 40*  CREATININE 1.01* 1.04*   Liver Panel  Recent Labs  06/13/15 0544 06/14/15 0518  PROT 5.9* 5.8*  ALBUMIN 2.6* 2.5*  AST 13* 14*  ALT 12* 11*  ALKPHOS 49 55  BILITOT 0.8 1.0   Sedimentation Rate No results for input(s): ESRSEDRATE in the last 72 hours. C-Reactive Protein No results for input(s): CRP in the last 72 hours.  Microbiology: Recent Results (from the past 240 hour(s))  Culture, Urine     Status: None   Collection Time: 06/09/15 10:14 AM  Result Value Ref Range Status   Specimen Description URINE, CLEAN CATCH  Final   Special Requests NONE  Final   Culture   Final    MULTIPLE SPECIES PRESENT, SUGGEST RECOLLECTION Performed at [facility]    Report Status 06/10/2015 FINAL  Final  Culture, body fluid-bottle     Status: None (Preliminary result)   Collection Time: 06/13/15 11:05 AM  Result Value Ref Range Status   Specimen Description FLUID RIGHT PLEURAL  Final   Special Requests NONE  Final   Culture   Final    NO GROWTH 1 DAY Performed at [facility]    Report Status PENDING  Incomplete  Gram stain     Status: None   Collection Time: 06/13/15 11:05 AM  Result Value Ref Range Status   Specimen Description FLUID RIGHT PLEURAL  Final   Special Requests NONE Performed at [facility]   Final   Gram Stain   Final    FEW WBC PRESENT,BOTH PMN AND MONONUCLEAR NO ORGANISMS SEEN    Report Status 06/13/2015 FINAL  Final    Studies/Results: Dg Chest 1  View  06/13/2015  CLINICAL DATA:  Status post thoracentesis EXAM: CHEST 1 VIEW COMPARISON:  June 11, 2015 chest radiograph and chest CT June 12, 2015 FINDINGS: There is no appreciable pneumothorax post right-sided thoracentesis. There is minimal residual pleural effusion on the right.  There is moderate pleural effusion on the left with patchy airspace consolidation throughout much of the left lung. Heart is enlarged with pulmonary vascularity within normal limits. There is atherosclerotic calcification in the aorta. No adenopathy is apparent. Bones are osteoporotic. IMPRESSION: No pneumothorax. Near complete resolution of right pleural effusion. Moderate pleural effusion on the left with airspace consolidation throughout much of the left lung. Stable cardiomegaly. Bones osteoporotic. Electronically Signed   By: Bretta BangWilliam  Woodruff III M.D.   On: 06/13/2015 11:45   Ct Chest Wo Contrast  06/12/2015  CLINICAL DATA:  Cough and nausea for 4 days EXAM: CT CHEST WITHOUT CONTRAST TECHNIQUE: Multidetector CT imaging of the chest was performed following the standard protocol without IV contrast. COMPARISON:  06/07/2015 FINDINGS: Lungs are well aerated bilaterally with bilateral pleural effusions of a moderate degree. These have increased in the interval from the prior exam. Bilateral lower lobe consolidation is again identified. There are new patchy infiltrative changes identified throughout both lungs. Some of these have a nodular component but given their acute nature are consistent with infiltrate. The thoracic inlet is within normal limits. Diffuse aortic calcifications are again identified. No significant hilar or mediastinal adenopathy is identified. There remains some dependent debris within the left bronchial tree likely related to some mucous. This is stable from the previous exam. The visualized upper abdomen and bony structures are stable. IMPRESSION: Increasing bilateral pleural effusions as well as increasing  bilateral infiltrates. Continued follow-up is recommended. Electronically Signed   By: Alcide CleverMark  Lukens M.D.   On: 06/12/2015 17:00   [place] Thoracentesis Asp Pleural Space W/img Guide  06/13/2015  INDICATION: Two weeks ago was diagnosed with influenza. Readmitted for pneumonia. Patient noted to have a right-sided pleural effusion and a request has been made for a diagnostic and therapeutic thoracentesis. EXAM: ULTRASOUND GUIDED DIAGNOSTIC AND THERAPEUTIC THORACENTESIS MEDICATIONS: 1% lidocaine COMPLICATIONS: None immediate. PROCEDURE: An ultrasound guided thoracentesis was thoroughly discussed with the patient and questions answered. The benefits, risks, alternatives and complications were also discussed. The patient understands and wishes to proceed with the procedure. Written consent was obtained. Ultrasound was performed to localize and mark an adequate pocket of fluid in the right chest. The area was then prepped and draped in the normal sterile fashion. 1% Lidocaine was used for local anesthesia. A Safe-T-Centesis catheter was introduced. Thoracentesis was performed. The catheter was removed and a dressing applied. FINDINGS: A total of approximately 0.36 L of yellow fluid was removed. Samples were sent to the laboratory as requested by the clinical team. IMPRESSION: Successful ultrasound guided right thoracentesis yielding 0.36 L of pleural fluid. Read by: Barnetta ChapelKelly Osborne, PA-C Electronically Signed   By: Malachy MoanHeath  McCullough M.D.   On: 06/13/2015 11:45     Assessment/Plan: Influenza HCAP Fluid overload     Total days of antibiotics: 15 3-7 levaquin 3-14 3-15 tamiflu 3-19 3-15 clinda 3-19 3-17 vanco  3-20 aztreonam 3-20 flagyl3-21  Will start to think about stopping her anbx (vanco) next 24h Chest PT Diuresis (she is +12L) Repeat CXR post diuresis      Johny SaxJeffrey Jahmez Bily Infectious Diseases (pager) [phone] [email or web address] 06/14/2015, 3:49 PM  LOS: 15 days

## 2015-06-14 NOTE — Plan of Care (Signed)
Problem: Physical Regulation: Goal: Ability to maintain a body temperature in the normal range will improve Outcome: Completed/Met Date Met:  06/14/15 Afebrile.    Problem: Respiratory: Goal: Respiratory status will improve Outcome: Progressing SpO2 99% on 2L O2.   Continue.   Goal: Ability to maintain a clear airway will improve Outcome: Progressing Still congested and with non-productive cough.   Continue.   Goal: Pain level will decrease Outcome: Progressing Patient denies pain at this time.

## 2015-06-14 NOTE — Progress Notes (Signed)
[facility] PROGRESS NOTE  Tonya MoatsMamie N Farley ZOX:[id number] DOB: 06/24/1919 DOA: 05/30/2015 PCP: Lorenda PeckOBERTS, Tonya WAYNE, MD  Summary Appreciate ID/pulmonary/IR. Tonya Farley is a pleasant 82 y.o. female with paroxysmal atrial fibrillation on coumadin who presented to the ED with c/o weakness and 4 day history of cough, nausea, decreased PO intake, generalized weakness and she was admitted for community acquired pneumonia/ Influenza type A. Patient has made very slow clinical progress despite antibiotics diuretics prompting 2 CT scans of the chest with suggestion of aspiration pneumonitis/fluid overload, ?HCAP. She had right sided pleural tap on 06/13/15, and 320cc of fluid was removed(analysis pending). Pleural tap has given her some respiratory relief. Although there is concern for aspiration pneumonitis, patient's family has declined swallow evaluation, and have insisted we liberalize her food. Of concern is that her white count has been trending up despite antibiotics, on and off steroids, hence ID/pulmonary consults. Will follow recommendations. Patient had INR reversed by vitamin k for pleural tap, and her coumadin can be resumed if no further tap contemplated. Patient's niece Tonya Farley has been very active in decision making and wants to be informed of changes to management. She had requested for ID to be primary and I discussed with Dr Ninetta LightsHatcher who assured me he will continue to follow along with the hopitalist service as a Research scientist (medical)consultant. We will follow ID/pulmonary/IR recommendations. Patient feels better this evening. She has less cough and is breathing better. Plan CAP (community acquired pneumonia)/ bilateral pleural effusions/aspiration pneumonitis/Dyspnea/ Influenza due to identified novel influenza A virus with other respiratory manifestations/chroic diastolic CHF  S/p right sided Ultrasound-guided thoracentesis, follow pleural fluid analysis  Defer to ID for antibiotic management  Follow  pulmonary recommendations Paroxysmal Atrial fibrillation (HCC)  Rate controlled  Resume Coumadin per pharmacy, for target INR 2-3 AKI (acute kidney injury) (HCC)  Improved some Right arm swelling, no dvt  ? Thrombophlebitis, no dvt  Monitor   Code Status: DNR Family Communication: Nieces at bedside Disposition Plan: Tbd   Consultants:  PCCM  ID  IR  Procedures:  Right sided thoracentesis 06/13/15  Antibiotics:  Per ID  HPI/Subjective: Feels better. Less cough/sob.  Objective: Filed Vitals:   06/13/15 2025 06/14/15 0459  BP: 129/82 147/66  Pulse: 87 76  Temp: 98.2 F (36.8 C) 98 F (36.7 C)  Resp: 24 20    Intake/Output Summary (Last 24 hours) at 06/14/15 0534 Last data filed at 06/13/15 2300  Gross per 24 hour  Intake    930 ml  Output      0 ml  Net    930 ml   Filed Weights   06/12/15 0435 06/13/15 0614 06/14/15 0459  Weight: 76 kg (167 lb 8.8 oz) 76.4 kg (168 lb 6.9 oz) 75.479 kg (166 lb 6.4 oz)    Exam:   General:  Comfortable at rest.  Cardiovascular: S1-S2 normal. No murmurs. Pulse regular.  Respiratory: Good air entry bilaterally. No rhonchi or rales.  Abdomen: Soft and nontender. Normal bowel sounds. No organomegaly.  Musculoskeletal: Less swelling right arm   Neurological: Intact  Data Reviewed: Basic Metabolic Panel:  Recent Labs Lab 06/09/15 0509 06/10/15 0608 06/11/15 0614 06/12/15 0520 06/13/15 0544 06/13/15 1300  NA 142 142 142 140 136  --   K 4.4 4.1 4.7 4.2 4.4  --   CL 100* 99* 100* 99* 96*  --   CO2 31 34* 31 32 30  --   GLUCOSE 179* 108* 216* 160* 154*  --   BUN 40*  40* 36* 36* 38*  --   CREATININE 1.21* 1.18* 1.15* 1.07* 1.01*  --   CALCIUM 9.4 9.0 9.2 9.0 8.9  --   MG  --   --   --   --   --  2.1  PHOS  --   --   --   --   --  2.4*   Liver Function Tests:  Recent Labs Lab 06/09/15 0509 06/10/15 0608 06/11/15 0614 06/12/15 0520 06/13/15 0544  AST 12* 11* 11* 11* 13*  ALT 14 13* 12* 12*  12*  ALKPHOS 43 43 48 51 49  BILITOT 0.5 0.8 0.9 0.7 0.8  PROT 6.2* 5.6* 5.9* 6.1* 5.9*  ALBUMIN 2.8* 2.5* 2.6* 2.8* 2.6*   No results for input(s): LIPASE, AMYLASE in the last 168 hours. No results for input(s): AMMONIA in the last 168 hours. CBC:  Recent Labs Lab 06/09/15 0509 06/10/15 0608 06/11/15 0614 06/12/15 0520 06/13/15 0544  WBC 15.6* 14.2* 18.4* 19.1* 19.8*  NEUTROABS 13.3* 11.8* 17.0* 16.9* 17.4*  HGB 10.3* 10.3* 9.8* 9.9* 10.0*  HCT 31.7* 33.0* 31.4* 31.2* 31.8*  MCV 82.8 87.5 87.2 86.9 86.6  PLT 249 246 257 240 245   Cardiac Enzymes: No results for input(s): CKTOTAL, CKMB, CKMBINDEX, TROPONINI in the last 168 hours. BNP (last 3 results)  Recent Labs  01/10/15 2016 05/30/15 1852 06/03/15 1611  BNP 382.1* 212.3* 358.1*    ProBNP (last 3 results) No results for input(s): PROBNP in the last 8760 hours.  CBG: No results for input(s): GLUCAP in the last 168 hours.  Recent Results (from the past 240 hour(s))  Culture, Urine     Status: None   Collection Time: 06/09/15 10:14 AM  Result Value Ref Range Status   Specimen Description URINE, CLEAN CATCH  Final   Special Requests NONE  Final   Culture   Final    MULTIPLE SPECIES PRESENT, SUGGEST RECOLLECTION Performed at [facility]    Report Status 06/10/2015 FINAL  Final  Gram stain     Status: None   Collection Time: 06/13/15 11:05 AM  Result Value Ref Range Status   Specimen Description FLUID RIGHT PLEURAL  Final   Special Requests NONE Performed at [facility]   Final   Gram Stain   Final    FEW WBC PRESENT,BOTH PMN AND MONONUCLEAR NO ORGANISMS SEEN    Report Status 06/13/2015 FINAL  Final     Studies: Dg Chest 1 View  06/13/2015  CLINICAL DATA:  Status post thoracentesis EXAM: CHEST 1 VIEW COMPARISON:  June 11, 2015 chest radiograph and chest CT June 12, 2015 FINDINGS: There is no appreciable pneumothorax post right-sided thoracentesis. There is minimal residual pleural  effusion on the right. There is moderate pleural effusion on the left with patchy airspace consolidation throughout much of the left lung. Heart is enlarged with pulmonary vascularity within normal limits. There is atherosclerotic calcification in the aorta. No adenopathy is apparent. Bones are osteoporotic. IMPRESSION: No pneumothorax. Near complete resolution of right pleural effusion. Moderate pleural effusion on the left with airspace consolidation throughout much of the left lung. Stable cardiomegaly. Bones osteoporotic. Electronically Signed   By: Bretta Bang III M.D.   On: 06/13/2015 11:45   Ct Chest Wo Contrast  06/12/2015  CLINICAL DATA:  Cough and nausea for 4 days EXAM: CT CHEST WITHOUT CONTRAST TECHNIQUE: Multidetector CT imaging of the chest was performed following the standard protocol without IV contrast. COMPARISON:  06/07/2015 FINDINGS: Lungs are  well aerated bilaterally with bilateral pleural effusions of a moderate degree. These have increased in the interval from the prior exam. Bilateral lower lobe consolidation is again identified. There are new patchy infiltrative changes identified throughout both lungs. Some of these have a nodular component but given their acute nature are consistent with infiltrate. The thoracic inlet is within normal limits. Diffuse aortic calcifications are again identified. No significant hilar or mediastinal adenopathy is identified. There remains some dependent debris within the left bronchial tree likely related to some mucous. This is stable from the previous exam. The visualized upper abdomen and bony structures are stable. IMPRESSION: Increasing bilateral pleural effusions as well as increasing bilateral infiltrates. Continued follow-up is recommended. Electronically Signed   By: Alcide Clever M.D.   On: 06/12/2015 17:00   [place] Thoracentesis Asp Pleural Space W/img Guide  06/13/2015  INDICATION: Two weeks ago was diagnosed with influenza. Readmitted for  pneumonia. Patient noted to have a right-sided pleural effusion and a request has been made for a diagnostic and therapeutic thoracentesis. EXAM: ULTRASOUND GUIDED DIAGNOSTIC AND THERAPEUTIC THORACENTESIS MEDICATIONS: 1% lidocaine COMPLICATIONS: None immediate. PROCEDURE: An ultrasound guided thoracentesis was thoroughly discussed with the patient and questions answered. The benefits, risks, alternatives and complications were also discussed. The patient understands and wishes to proceed with the procedure. Written consent was obtained. Ultrasound was performed to localize and mark an adequate pocket of fluid in the right chest. The area was then prepped and draped in the normal sterile fashion. 1% Lidocaine was used for local anesthesia. A Safe-T-Centesis catheter was introduced. Thoracentesis was performed. The catheter was removed and a dressing applied. FINDINGS: A total of approximately 0.36 L of yellow fluid was removed. Samples were sent to the laboratory as requested by the clinical team. IMPRESSION: Successful ultrasound guided right thoracentesis yielding 0.36 L of pleural fluid. Read by: Barnetta Chapel, PA-C Electronically Signed   By: Malachy Moan M.D.   On: 06/13/2015 11:45    Scheduled Meds: . antiseptic oral rinse  7 mL Mouth Rinse BID  . atorvastatin  10 mg Oral Daily  . aztreonam  2 g Intravenous 3 times per day  . brimonidine  1 drop Both Eyes TID  . budesonide (PULMICORT) nebulizer solution  0.5 mg Nebulization BID  . diltiazem  360 mg Oral Daily  . furosemide  20 mg Oral Daily  . guaiFENesin  1,200 mg Oral BID  . ipratropium-albuterol  3 mL Nebulization QID  . labetalol  200 mg Oral BID  . latanoprost  1 drop Both Eyes QHS  . levothyroxine  100 mcg Oral QAC breakfast  . multivitamin with minerals  1 tablet Oral Daily  . pantoprazole  40 mg Oral Daily  . predniSONE  10 mg Oral Q breakfast  . timolol  1 drop Both Eyes q morning - 10a  . vancomycin  1,000 mg Intravenous QHS    Continuous Infusions:    Time spent: 25 minutes    Tonya Farley  [facility] Pager [phone]. If 7PM-7AM, please contact night-coverage at [email or web address], password [email or web address] 06/14/2015, 5:34 AM  LOS: 15 days

## 2015-06-14 NOTE — Progress Notes (Signed)
[facility] PROGRESS NOTE  GWENDLYN HANBACK ZOX:[id number] DOB: Jan 15, 1920 DOA: 05/30/2015 PCP: Lorenda Peck, MD  Summary Appreciate ID/pulmonary/IR. Tonya Farley is a pleasant 82 y.o. female with paroxysmal atrial fibrillation on coumadin who presented to the ED with c/o weakness and 4 day history of cough, nausea, decreased PO intake, generalized weakness and she was admitted for community acquired pneumonia/ Influenza type A. Patient has made very slow clinical progress despite antibiotics diuretics prompting 2 CT scans of the chest with suggestion of aspiration pneumonitis/fluid overload, ?HCAP. She had right sided pleural tap on 06/13/15, and 320cc of fluid was removed(analysis pending). Pleural tap has given her some respiratory relief. Although there is concern for aspiration pneumonitis, patient's family has declined swallow evaluation, and have insisted we liberalize her food. Of concern is that her white count has been trending up despite antibiotics, on and off steroids, hence ID/pulmonary consults. Will follow recommendations. Patient had INR reversed by vitamin k for pleural tap, and her coumadin can be resumed if no further tap contemplated. Patient's niece Darel Hong has been very active in decision making and wants to be informed of changes to management. She had requested for ID to be primary and I discussed with Dr Ninetta Lights who assured me he will continue to follow along with the hopitalist service as a Research scientist (medical). We will follow ID/pulmonary/IR recommendations.  Plan CAP (community acquired pneumonia)/ bilateral pleural effusions/Dyspnea/ Influenza due to identified novel influenza A virus with other respiratory manifestations  S/p right sided Ultrasound-guided thoracentesis, follow pleural fluid analysis  Defer to ID for antibiotic management  Follow pulmonary recommendations  Continue pulmicort, mucinex and flutter valve  Acute on chroic diastolic CHF  Continue  daily weights  Strict intake and output   Continue IV lasix  Paroxysmal Atrial fibrillation (HCC)  Rate controlled  Resume Coumadin per pharmacy, for target INR 2-3  AKI (acute kidney injury) (HCC)  Some improvement appreciated  Will monitor renal function  Continue diuresis  Right arm swelling, no dvt  No dvt  Monitor   Continue diuresis  HLD: continue statins   Hypothyroidism: continue synthroid    Code Status: DNR Family Communication: No family at bedside  Disposition Plan: Tbd   Consultants:  PCCM  ID  IR  Procedures:  Right sided thoracentesis 06/13/15  Antibiotics:  Per ID  HPI/Subjective: Feels better; no CP and breathing easier. Positive signs fluid overload on exam.  Objective: Filed Vitals:   06/14/15 0923 06/14/15 1352  BP: 143/76 126/55  Pulse: 77 81  Temp:  98.4 F (36.9 C)  Resp:  20    Intake/Output Summary (Last 24 hours) at 06/14/15 2149 Last data filed at 06/14/15 1353  Gross per 24 hour  Intake   1120 ml  Output      0 ml  Net   1120 ml   Filed Weights   06/12/15 0435 06/13/15 0614 06/14/15 0459  Weight: 76 kg (167 lb 8.8 oz) 76.4 kg (168 lb 6.9 oz) 75.479 kg (166 lb 6.4 oz)    Exam:   General:  Comfortable at rest; reports breathing is improved. Still signs of fluid overload appreciated.  Cardiovascular: S1-S2 normal. No murmurs. Pulse regular.  Respiratory: positive rhonchi, no wheezing, decrease BS at bases bilaterally and positive crackles   Abdomen: Soft and nontender. Normal bowel sounds. No organomegaly.  Musculoskeletal: 2-3++ edema bilaterally affecting LE bilaterally and upper extremities   Neurological: Intact  Data Reviewed: Basic Metabolic Panel:  Recent Labs Lab 06/10/15 0608 06/11/15  1610 06/12/15 0520 06/13/15 0544 06/13/15 1300 06/14/15 0518  NA 142 142 140 136  --  138  K 4.1 4.7 4.2 4.4  --  4.3  CL 99* 100* 99* 96*  --  98*  CO2 34* 31 32 30  --  32  GLUCOSE 108* 216*  160* 154*  --  100*  BUN 40* 36* 36* 38*  --  40*  CREATININE 1.18* 1.15* 1.07* 1.01*  --  1.04*  CALCIUM 9.0 9.2 9.0 8.9  --  9.1  MG  --   --   --   --  2.1  --   PHOS  --   --   --   --  2.4*  --    Liver Function Tests:  Recent Labs Lab 06/10/15 0608 06/11/15 0614 06/12/15 0520 06/13/15 0544 06/14/15 0518  AST 11* 11* 11* 13* 14*  ALT 13* 12* 12* 12* 11*  ALKPHOS 43 48 51 49 55  BILITOT 0.8 0.9 0.7 0.8 1.0  PROT 5.6* 5.9* 6.1* 5.9* 5.8*  ALBUMIN 2.5* 2.6* 2.8* 2.6* 2.5*   CBC:  Recent Labs Lab 06/10/15 0608 06/11/15 0614 06/12/15 0520 06/13/15 0544 06/14/15 0518  WBC 14.2* 18.4* 19.1* 19.8* 19.2*  NEUTROABS 11.8* 17.0* 16.9* 17.4* 16.7*  HGB 10.3* 9.8* 9.9* 10.0* 10.8*  HCT 33.0* 31.4* 31.2* 31.8* 32.5*  MCV 87.5 87.2 86.9 86.6 82.7  PLT 246 257 240 245 272   BNP (last 3 results)  Recent Labs  05/30/15 1852 06/03/15 1611 06/14/15 0813  BNP 212.3* 358.1* 365.3*    Recent Results (from the past 240 hour(s))  Culture, Urine     Status: None   Collection Time: 06/09/15 10:14 AM  Result Value Ref Range Status   Specimen Description URINE, CLEAN CATCH  Final   Special Requests NONE  Final   Culture   Final    MULTIPLE SPECIES PRESENT, SUGGEST RECOLLECTION Performed at [facility]    Report Status 06/10/2015 FINAL  Final  Culture, body fluid-bottle     Status: None (Preliminary result)   Collection Time: 06/13/15 11:05 AM  Result Value Ref Range Status   Specimen Description FLUID RIGHT PLEURAL  Final   Special Requests NONE  Final   Culture   Final    NO GROWTH 1 DAY Performed at [facility]    Report Status PENDING  Incomplete  Gram stain     Status: None   Collection Time: 06/13/15 11:05 AM  Result Value Ref Range Status   Specimen Description FLUID RIGHT PLEURAL  Final   Special Requests NONE Performed at [facility]   Final   Gram Stain   Final    FEW WBC PRESENT,BOTH PMN AND MONONUCLEAR NO ORGANISMS SEEN     Report Status 06/13/2015 FINAL  Final     Studies: Dg Chest 1 View  06/13/2015  CLINICAL DATA:  Status post thoracentesis EXAM: CHEST 1 VIEW COMPARISON:  June 11, 2015 chest radiograph and chest CT June 12, 2015 FINDINGS: There is no appreciable pneumothorax post right-sided thoracentesis. There is minimal residual pleural effusion on the right. There is moderate pleural effusion on the left with patchy airspace consolidation throughout much of the left lung. Heart is enlarged with pulmonary vascularity within normal limits. There is atherosclerotic calcification in the aorta. No adenopathy is apparent. Bones are osteoporotic. IMPRESSION: No pneumothorax. Near complete resolution of right pleural effusion. Moderate pleural effusion on the left with airspace consolidation throughout much of the  left lung. Stable cardiomegaly. Bones osteoporotic. Electronically Signed   By: Bretta BangWilliam  Woodruff III M.D.   On: 06/13/2015 11:45   [place] Thoracentesis Asp Pleural Space W/img Guide  06/13/2015  INDICATION: Two weeks ago was diagnosed with influenza. Readmitted for pneumonia. Patient noted to have a right-sided pleural effusion and a request has been made for a diagnostic and therapeutic thoracentesis. EXAM: ULTRASOUND GUIDED DIAGNOSTIC AND THERAPEUTIC THORACENTESIS MEDICATIONS: 1% lidocaine COMPLICATIONS: None immediate. PROCEDURE: An ultrasound guided thoracentesis was thoroughly discussed with the patient and questions answered. The benefits, risks, alternatives and complications were also discussed. The patient understands and wishes to proceed with the procedure. Written consent was obtained. Ultrasound was performed to localize and mark an adequate pocket of fluid in the right chest. The area was then prepped and draped in the normal sterile fashion. 1% Lidocaine was used for local anesthesia. A Safe-T-Centesis catheter was introduced. Thoracentesis was performed. The catheter was removed and a dressing applied.  FINDINGS: A total of approximately 0.36 L of yellow fluid was removed. Samples were sent to the laboratory as requested by the clinical team. IMPRESSION: Successful ultrasound guided right thoracentesis yielding 0.36 L of pleural fluid. Read by: Barnetta ChapelKelly Osborne, PA-C Electronically Signed   By: Malachy MoanHeath  McCullough M.D.   On: 06/13/2015 11:45    Scheduled Meds: . antiseptic oral rinse  7 mL Mouth Rinse BID  . atorvastatin  10 mg Oral Daily  . aztreonam  2 g Intravenous 3 times per day  . brimonidine  1 drop Both Eyes TID  . budesonide (PULMICORT) nebulizer solution  0.5 mg Nebulization BID  . diltiazem  360 mg Oral Daily  . furosemide  30 mg Intravenous Daily  . guaiFENesin  1,200 mg Oral BID  . ipratropium-albuterol  3 mL Nebulization QID  . labetalol  200 mg Oral BID  . latanoprost  1 drop Both Eyes QHS  . levothyroxine  100 mcg Oral QAC breakfast  . multivitamin with minerals  1 tablet Oral Daily  . pantoprazole  40 mg Oral Daily  . predniSONE  10 mg Oral Q breakfast  . timolol  1 drop Both Eyes q morning - 10a  . vancomycin  1,000 mg Intravenous QHS  . Warfarin - Pharmacist Dosing Inpatient   Does not apply q1800   Continuous Infusions:    Time spent: 25 minutes    Vassie LollMadera, Kimbria Camposano  [facility] Pager [phone]. If 7PM-7AM, please contact night-coverage at [email or web address], password [email or web address] 06/14/2015, 9:49 PM  LOS: 15 days

## 2015-06-14 NOTE — Progress Notes (Signed)
Name: Tonya Farley MRN: [id number] DOB: [birth year]    ADMISSION DATE:  05/30/2015 CONSULTATION DATE:  3/13  REFERRING MD :  TH  CHIEF COMPLAINT:  Cough initially,  PCCM reconsulted for SOB, B effusion, HCAP.   BRIEF PATIENT DESCRIPTION: 82yo female with hx HTN, AFib on coumadin, CKD III admitted 3/7 with PNA, flu A.   She was treated with IV abx and improving slowly.  PCCM consulted 3/13 for ongoing cough.  Pt improved with regards to her cough. She was given Abx and tamiflu.  Slow to improved.  The last week -- noted to be more SOB.  Chest ct scan with new B infiltrates and more pleural effusion, compared to baselines.   SIGNIFICANT EVENTS  3/7 admit 3/13 PCCM consulted for cough -- coguh got better 3/21 PCCM re-consulted for worse SOB. CT scan with B PNA and B effusion. Had R thoracentesis.   STUDIES:  Chest CT scan (3/20)  increasing bilateral effusion, increasing infiltrates compared to chest CT scan done on March 15. 2Decho (3/12) LVEF 55%, diastolic dysfunction, left atrial enlargement, atrial fibrillation.   SUBJECTIVE:  Pt feels better and is less SOB. Less cough.   VITAL SIGNS: Temp:  [98 F (36.7 C)-98.2 F (36.8 C)] 98 F (36.7 C) (03/22 0459) Pulse Rate:  [76-87] 77 (03/22 0923) Resp:  [20-24] 20 (03/22 0459) BP: (129-147)/(50-82) 143/76 mmHg (03/22 0923) SpO2:  [90 %-100 %] 95 % (03/22 0814) Weight:  [166 lb 6.4 oz (75.479 kg)] 166 lb 6.4 oz (75.479 kg) (03/22 0459)  PHYSICAL EXAMINATION: General:  Pleasant elderly female, not in distress respiratory, sitting in bed Neuro:  Awake, alert, appropriate, CN grossly intact. (-) lateralizing signs.  HEENT:  Mm moist, no JVD  (-) oral thrush Cardiovascular:  s1s2 irreg  Lungs:   Crackles bilaterally mid to bases. Less than 3/21 Abdomen:  Round, soft, non tender  Musculoskeletal:  Warm and dry, BUE symmetric edema, (+) BLE edema    Recent Labs Lab 06/12/15 0520 06/13/15 0544 06/14/15 0518  NA 140 136 138    K 4.2 4.4 4.3  CL 99* 96* 98*  CO2 32 30 32  BUN 36* 38* 40*  CREATININE 1.07* 1.01* 1.04*  GLUCOSE 160* 154* 100*    Recent Labs Lab 06/12/15 0520 06/13/15 0544 06/14/15 0518  HGB 9.9* 10.0* 10.8*  HCT 31.2* 31.8* 32.5*  WBC 19.1* 19.8* 19.2*  PLT 240 245 272   Dg Chest 1 View  06/13/2015  CLINICAL DATA:  Status post thoracentesis EXAM: CHEST 1 VIEW COMPARISON:  June 11, 2015 chest radiograph and chest CT June 12, 2015 FINDINGS: There is no appreciable pneumothorax post right-sided thoracentesis. There is minimal residual pleural effusion on the right. There is moderate pleural effusion on the left with patchy airspace consolidation throughout much of the left lung. Heart is enlarged with pulmonary vascularity within normal limits. There is atherosclerotic calcification in the aorta. No adenopathy is apparent. Bones are osteoporotic. IMPRESSION: No pneumothorax. Near complete resolution of right pleural effusion. Moderate pleural effusion on the left with airspace consolidation throughout much of the left lung. Stable cardiomegaly. Bones osteoporotic. Electronically Signed   By: Bretta Bang III M.D.   On: 06/13/2015 11:45   Ct Chest Wo Contrast  06/12/2015  CLINICAL DATA:  Cough and nausea for 4 days EXAM: CT CHEST WITHOUT CONTRAST TECHNIQUE: Multidetector CT imaging of the chest was performed following the standard protocol without IV contrast. COMPARISON:  06/07/2015 FINDINGS: Lungs are well aerated bilaterally  with bilateral pleural effusions of a moderate degree. These have increased in the interval from the prior exam. Bilateral lower lobe consolidation is again identified. There are new patchy infiltrative changes identified throughout both lungs. Some of these have a nodular component but given their acute nature are consistent with infiltrate. The thoracic inlet is within normal limits. Diffuse aortic calcifications are again identified. No significant hilar or mediastinal  adenopathy is identified. There remains some dependent debris within the left bronchial tree likely related to some mucous. This is stable from the previous exam. The visualized upper abdomen and bony structures are stable. IMPRESSION: Increasing bilateral pleural effusions as well as increasing bilateral infiltrates. Continued follow-up is recommended. Electronically Signed   By: Alcide CleverMark  Lukens M.D.   On: 06/12/2015 17:00   [place] Thoracentesis Asp Pleural Space W/img Guide  06/13/2015  INDICATION: Two weeks ago was diagnosed with influenza. Readmitted for pneumonia. Patient noted to have a right-sided pleural effusion and a request has been made for a diagnostic and therapeutic thoracentesis. EXAM: ULTRASOUND GUIDED DIAGNOSTIC AND THERAPEUTIC THORACENTESIS MEDICATIONS: 1% lidocaine COMPLICATIONS: None immediate. PROCEDURE: An ultrasound guided thoracentesis was thoroughly discussed with the patient and questions answered. The benefits, risks, alternatives and complications were also discussed. The patient understands and wishes to proceed with the procedure. Written consent was obtained. Ultrasound was performed to localize and mark an adequate pocket of fluid in the right chest. The area was then prepped and draped in the normal sterile fashion. 1% Lidocaine was used for local anesthesia. A Safe-T-Centesis catheter was introduced. Thoracentesis was performed. The catheter was removed and a dressing applied. FINDINGS: A total of approximately 0.36 L of yellow fluid was removed. Samples were sent to the laboratory as requested by the clinical team. IMPRESSION: Successful ultrasound guided right thoracentesis yielding 0.36 L of pleural fluid. Read by: Barnetta ChapelKelly Osborne, PA-C Electronically Signed   By: Malachy MoanHeath  McCullough M.D.   On: 06/13/2015 11:45    ASSESSMENT / PLAN: Pt with bilateral infiltrates, chest CT (06/12/15) scan findings worse compared to baseline (3/15). Also with worse pleural effusion, bilateral. Patient  is status post thoracentesis on the right (3/20). They were able to drain 300 ML's clear fluid. Considerations include the following: 1. Aspiration pneumonitis/PNA. Patient denies any difficulty swallowing but she states that food stays in her mouth. 2. Healthcare associated pneumonia. Possible staph or gram-negative pneumonia. Patient has been admitted since March 13. She is at risk for healthcare associated pneumonia. 3. Recent influenza. 4. Volume overload. Pulmonary edema. She stated liters positive since admission. 5. Diastolic dysfunction.  Plan : 1. Continue diuresis -- plan for (-) 1L/24 hrs. May need more lasix. Check electrolytes.  2. Cont Abx per ID. On aztreonam and vancomycin. 3. Daily INR -- pt on coumadin and abx. 4. On 3L  -- Keep O2 sats >90%. 5. Will hold off on L thoracentesis 6. Cont duoneb qid and pulmicort BID.  7. IS and acapella.  8. Pt is DNR.  9. Observe for aspiration. Pt and nurse deny issues with swallowing. Will observe for now.   Pollie MeyerJ. Angelo A de Dios, MD 06/14/2015, 9:32 AM [facility] Pager [phone] After 3 pm or if no answer, call [phone]

## 2015-06-14 NOTE — Progress Notes (Signed)
ANTICOAGULATION CONSULT NOTE - Initial Consult  Pharmacy Consult for warfarin Indication: atrial fibrillation  Allergies  Allergen Reactions  . Penicillins Hives and Swelling       . Sulfa Antibiotics Anaphylaxis and Swelling    Patient Measurements: Height: 5\' 4"  (162.6 cm) Weight: 166 lb 6.4 oz (75.479 kg) IBW/kg (Calculated) : 54.7 Heparin Dosing Weight:   Vital Signs: Temp: 98 F (36.7 C) (03/22 0459) Temp Source: Oral (03/22 0459) BP: 147/66 mmHg (03/22 0459) Pulse Rate: 76 (03/22 0459)  Labs:  Recent Labs  06/11/15 0614 06/12/15 0520 06/13/15 0544 06/14/15 0514  HGB 9.8* 9.9* 10.0*  --   HCT 31.4* 31.2* 31.8*  --   PLT 257 240 245  --   LABPROT 25.8* 29.7* 21.5* 18.4*  INR 2.39* 2.88* 1.88* 1.53*  CREATININE 1.15* 1.07* 1.01*  --     Estimated Creatinine Clearance: 34.6 mL/min (by C-G formula based on Cr of 1.01).   Medical History: Past Medical History  Diagnosis Date  . Hypertension   . Hyperlipidemia   . Thyroid disease   . Hypothyroidism   . Chronic back pain   . PONV (postoperative nausea and vomiting)   . Chronic a-fib (HCC)   . A-fib (HCC)     Medications:  Prescriptions prior to admission  Medication Sig Dispense Refill Last Dose  . alendronate (FOSAMAX) 70 MG tablet Take 70 mg by mouth once a week. Take with a full glass of water on an empty stomach.   Past Week at Unknown time  . atorvastatin (LIPITOR) 10 MG tablet Take 10 mg by mouth daily.   05/29/2015 at Unknown time  . B Complex-C (B-COMPLEX WITH VITAMIN C) tablet Take 1 tablet by mouth daily.   05/30/2015 at Unknown time  . brimonidine (ALPHAGAN) 0.15 % ophthalmic solution Place 1 drop into both eyes 3 (three) times daily.   05/29/2015 at Unknown time  . Cholecalciferol (VITAMIN D PO) Take by mouth.   05/30/2015 at Unknown time  . diltiazem (CARDIZEM CD) 360 MG 24 hr capsule Take 1 capsule (360 mg total) by mouth daily. 90 capsule 0 05/29/2015 at Unknown time  . furosemide (LASIX) 40 MG  tablet Take 40 mg by mouth daily.    05/30/2015 at Unknown time  . labetalol (NORMODYNE) 200 MG tablet Take 1 tablet (200 mg total) by mouth 2 (two) times daily. 60 tablet 5 05/29/2015 at 2100  . latanoprost (XALATAN) 0.005 % ophthalmic solution Place 1 drop into both eyes at bedtime.  3 05/29/2015 at Unknown time  . levofloxacin (LEVAQUIN) 500 MG tablet Take 500 mg by mouth daily.   05/29/2015 at Unknown time  . levothyroxine (SYNTHROID, LEVOTHROID) 100 MCG tablet Take 100 mcg by mouth daily.   05/30/2015 at Unknown time  . Polyethyl Glycol-Propyl Glycol (SYSTANE OP) Apply 2 drops to eye daily as needed (dry eyes).    05/29/2015 at Unknown time  . potassium chloride SA (K-DUR,KLOR-CON) 20 MEQ tablet Take 20 mEq by mouth daily.   05/30/2015 at Unknown time  . timolol (TIMOPTIC) 0.5 % ophthalmic solution Place 1 drop into both eyes every morning.   3 05/29/2015 at Unknown time  . warfarin (COUMADIN) 1 MG tablet Take as directed by coumadin clinic (Patient taking differently: Take 2-3 mg by mouth See admin instructions. Take 3 mg once daily on Sunday, Tuesday, Thursday, and Saturday.  Take 2 mg once daily on Monday, Wednesday, and Friday.) 270 tablet 0 05/29/2015 at 2100   Scheduled:  . antiseptic  oral rinse  7 mL Mouth Rinse BID  . atorvastatin  10 mg Oral Daily  . aztreonam  2 g Intravenous 3 times per day  . brimonidine  1 drop Both Eyes TID  . budesonide (PULMICORT) nebulizer solution  0.5 mg Nebulization BID  . diltiazem  360 mg Oral Daily  . furosemide  20 mg Oral Daily  . guaiFENesin  1,200 mg Oral BID  . ipratropium-albuterol  3 mL Nebulization QID  . labetalol  200 mg Oral BID  . latanoprost  1 drop Both Eyes QHS  . levothyroxine  100 mcg Oral QAC breakfast  . multivitamin with minerals  1 tablet Oral Daily  . pantoprazole  40 mg Oral Daily  . predniSONE  10 mg Oral Q breakfast  . timolol  1 drop Both Eyes q morning - 10a  . vancomycin  1,000 mg Intravenous QHS  . warfarin  3 mg Oral NOW  .  Warfarin - Pharmacist Dosing Inpatient   Does not apply q1800    Assessment: Patient on warfarin PTA for afib.  Warfarin held due to procedure, new order from MD to resume warfarin with goal 2-3.  INR this AM 1.53.  Goal of Therapy:  INR 2-3    Plan:  Warfarin  po x1 now Daily INR  Darlina Guys, Jacquenette Shone Crowford 06/14/2015,5:57 AM

## 2015-06-15 DIAGNOSIS — J90 Pleural effusion, not elsewhere classified: Secondary | ICD-10-CM | POA: Insufficient documentation

## 2015-06-15 DIAGNOSIS — R5381 Other malaise: Secondary | ICD-10-CM | POA: Insufficient documentation

## 2015-06-15 DIAGNOSIS — I5033 Acute on chronic diastolic (congestive) heart failure: Secondary | ICD-10-CM

## 2015-06-15 DIAGNOSIS — D72828 Other elevated white blood cell count: Secondary | ICD-10-CM

## 2015-06-15 LAB — BASIC METABOLIC PANEL
ANION GAP: 9 (ref 5–15)
BUN: 43 mg/dL — ABNORMAL HIGH (ref 6–20)
CALCIUM: 8.8 mg/dL — AB (ref 8.9–10.3)
CO2: 31 mmol/L (ref 22–32)
CREATININE: 1.07 mg/dL — AB (ref 0.44–1.00)
Chloride: 99 mmol/L — ABNORMAL LOW (ref 101–111)
GFR calc Af Amer: 50 mL/min — ABNORMAL LOW (ref >60)
GFR, EST NON AFRICAN AMERICAN: 43 mL/min — AB (ref >60)
GLUCOSE: 102 mg/dL — AB (ref 65–99)
Potassium: 4.5 mmol/L (ref 3.5–5.1)
Sodium: 139 mmol/L (ref 135–145)

## 2015-06-15 LAB — PROTIME-INR
INR: 1.66 — AB (ref 0.00–1.49)
PROTHROMBIN TIME: 19.1 s — AB (ref 11.6–15.2)

## 2015-06-15 MED ORDER — WARFARIN SODIUM 3 MG PO TABS
3.0000 mg | ORAL_TABLET | Freq: Once | ORAL | Status: AC
  Administered 2015-06-15: 3 mg via ORAL
  Filled 2015-06-15: qty 1

## 2015-06-15 MED ORDER — FUROSEMIDE 10 MG/ML IJ SOLN
30.0000 mg | Freq: Two times a day (BID) | INTRAMUSCULAR | Status: DC
  Administered 2015-06-15 – 2015-06-19 (×7): 30 mg via INTRAVENOUS
  Filled 2015-06-15 (×7): qty 4

## 2015-06-15 NOTE — Care Management Important Message (Signed)
Important Message  Patient Details  Name: Tonya Farley MRN: [id number] Date of Birth: 10/26/1919   Medicare Important Message Given:  Yes    [name], [name] 06/15/2015, 11:01 AMImportant Message  Patient Details  Name: Tonya Farley MRN: [id number] Date of Birth: 04/10/1919   Medicare Important Message Given:  Yes    [name], [name] 06/15/2015, 11:01 AM

## 2015-06-15 NOTE — Progress Notes (Signed)
INFECTIOUS DISEASE PROGRESS NOTE  ID: Tonya Farley is a 82 y.o. female with  Principal Problem:   CAP (community acquired pneumonia) Active Problems:   Atrial fibrillation (HCC)   Pleural effusion, bacterial   Dyspnea   AKI (acute kidney injury) (HCC)   Influenza due to identified novel influenza A virus with other respiratory manifestations   Cough   HCAP (healthcare-associated pneumonia)   Chest congestion   SOB (shortness of breath)   Swelling   Acute on chronic diastolic heart failure (HCC)   HLD (hyperlipidemia)  Subjective: Resting quietly  Abtx:  Anti-infectives    Start     Dose/Rate Route Frequency Ordered Stop   06/12/15 2200  aztreonam (AZACTAM) 2 g in dextrose 5 % 50 mL IVPB     2 g 100 mL/hr over 30 Minutes Intravenous 3 times per day 06/12/15 2106     06/12/15 2100  metroNIDAZOLE (FLAGYL) IVPB 500 mg  Status:  Discontinued     500 mg 100 mL/hr over 60 Minutes Intravenous 3 times per day 06/12/15 2043 06/13/15 1629   06/12/15 2100  vancomycin (VANCOCIN) IVPB 1000 mg/200 mL premix     1,000 mg 200 mL/hr over 60 Minutes Intravenous Daily at bedtime 06/12/15 2054     06/10/15 0000  clindamycin (CLEOCIN) IVPB 600 mg  Status:  Discontinued     600 mg 100 mL/hr over 30 Minutes Intravenous Every 8 hours 06/09/15 1724 06/11/15 1311   06/09/15 1200  vancomycin (VANCOCIN) IVPB 1000 mg/200 mL premix  Status:  Discontinued     1,000 mg 200 mL/hr over 60 Minutes Intravenous Every 24 hours 06/09/15 0947 06/11/15 1311   06/08/15 1600  clindamycin (CLEOCIN) IVPB 900 mg  Status:  Discontinued     900 mg 100 mL/hr over 30 Minutes Intravenous Every 8 hours 06/08/15 1452 06/09/15 1724   06/07/15 1700  clindamycin (CLEOCIN) IVPB 600 mg  Status:  Discontinued     600 mg 100 mL/hr over 30 Minutes Intravenous Every 8 hours 06/07/15 1533 06/08/15 1452   06/07/15 1545  oseltamivir (TAMIFLU) capsule 30 mg  Status:  Discontinued     30 mg Oral 2 times daily 06/07/15 1533  06/11/15 1313   06/05/15 2200  levofloxacin (LEVAQUIN) tablet 750 mg  Status:  Discontinued     750 mg Oral Every 48 hours 06/05/15 0900 06/06/15 1223   06/01/15 2200  levofloxacin (LEVAQUIN) IVPB 750 mg  Status:  Discontinued     750 mg 100 mL/hr over 90 Minutes Intravenous Every 48 hours 05/31/15 0747 06/05/15 0859   05/30/15 2130  levofloxacin (LEVAQUIN) IVPB 750 mg  Status:  Discontinued     750 mg 100 mL/hr over 90 Minutes Intravenous Every 24 hours 05/30/15 2120 05/31/15 0747   05/30/15 2115  levofloxacin (LEVAQUIN) tablet 500 mg  Status:  Discontinued     500 mg Oral Daily 05/30/15 2103 05/30/15 2104   05/30/15 2115  cefTRIAXone (ROCEPHIN) 1 g in dextrose 5 % 50 mL IVPB  Status:  Discontinued     1 g 100 mL/hr over 30 Minutes Intravenous Every 24 hours 05/30/15 2108 05/30/15 2118   05/30/15 2115  azithromycin (ZITHROMAX) tablet 500 mg  Status:  Discontinued     500 mg Oral Every 24 hours 05/30/15 2108 05/30/15 2118      Medications:  Scheduled: . antiseptic oral rinse  7 mL Mouth Rinse BID  . atorvastatin  10 mg Oral Daily  . aztreonam  2 g Intravenous  3 times per day  . brimonidine  1 drop Both Eyes TID  . budesonide (PULMICORT) nebulizer solution  0.5 mg Nebulization BID  . diltiazem  360 mg Oral Daily  . furosemide  30 mg Intravenous Daily  . guaiFENesin  1,200 mg Oral BID  . ipratropium-albuterol  3 mL Nebulization QID  . labetalol  200 mg Oral BID  . latanoprost  1 drop Both Eyes QHS  . levothyroxine  100 mcg Oral QAC breakfast  . multivitamin with minerals  1 tablet Oral Daily  . pantoprazole  40 mg Oral Daily  . predniSONE  10 mg Oral Q breakfast  . timolol  1 drop Both Eyes q morning - 10a  . vancomycin  1,000 mg Intravenous QHS  . Warfarin - Pharmacist Dosing Inpatient   Does not apply q1800    Objective: Vital signs in last 24 hours: Temp:  [98.2 F (36.8 C)-98.9 F (37.2 C)] 98.9 F (37.2 C) (03/23 0548) Pulse Rate:  [72-81] 72 (03/23 0548) Resp:   [20] 20 (03/23 0548) BP: (126-142)/(55-72) 138/72 mmHg (03/23 0548) SpO2:  [97 %-100 %] 98 % (03/23 0830)   General appearance: no distress Resp: mild expiratory rhonic (snoring) good air mvmt Cardio: regular rate and rhythm GI: normal findings: bowel sounds normal and soft, non-tender  Lab Results  Recent Labs  06/13/15 0544 06/14/15 0518 06/15/15 0512  WBC 19.8* 19.2*  --   HGB 10.0* 10.8*  --   HCT 31.8* 32.5*  --   NA 136 138 139  K 4.4 4.3 4.5  CL 96* 98* 99*  CO2 30 32 31  BUN 38* 40* 43*  CREATININE 1.01* 1.04* 1.07*   Liver Panel  Recent Labs  06/13/15 0544 06/14/15 0518  PROT 5.9* 5.8*  ALBUMIN 2.6* 2.5*  AST 13* 14*  ALT 12* 11*  ALKPHOS 49 55  BILITOT 0.8 1.0   Sedimentation Rate No results for input(s): ESRSEDRATE in the last 72 hours. C-Reactive Protein No results for input(s): CRP in the last 72 hours.  Microbiology: Recent Results (from the past 240 hour(s))  Culture, Urine     Status: None   Collection Time: 06/09/15 10:14 AM  Result Value Ref Range Status   Specimen Description URINE, CLEAN CATCH  Final   Special Requests NONE  Final   Culture   Final    MULTIPLE SPECIES PRESENT, SUGGEST RECOLLECTION Performed at [facility]    Report Status 06/10/2015 FINAL  Final  Culture, body fluid-bottle     Status: None (Preliminary result)   Collection Time: 06/13/15 11:05 AM  Result Value Ref Range Status   Specimen Description FLUID RIGHT PLEURAL  Final   Special Requests NONE  Final   Culture   Final    NO GROWTH 1 DAY Performed at [facility]    Report Status PENDING  Incomplete  Gram stain     Status: None   Collection Time: 06/13/15 11:05 AM  Result Value Ref Range Status   Specimen Description FLUID RIGHT PLEURAL  Final   Special Requests NONE Performed at [facility]   Final   Gram Stain   Final    FEW WBC PRESENT,BOTH PMN AND MONONUCLEAR NO ORGANISMS SEEN    Report Status 06/13/2015 FINAL  Final     Studies/Results: Dg Chest 1 View  06/13/2015  CLINICAL DATA:  Status post thoracentesis EXAM: CHEST 1 VIEW COMPARISON:  June 11, 2015 chest radiograph and chest CT June 12, 2015 FINDINGS:  There is no appreciable pneumothorax post right-sided thoracentesis. There is minimal residual pleural effusion on the right. There is moderate pleural effusion on the left with patchy airspace consolidation throughout much of the left lung. Heart is enlarged with pulmonary vascularity within normal limits. There is atherosclerotic calcification in the aorta. No adenopathy is apparent. Bones are osteoporotic. IMPRESSION: No pneumothorax. Near complete resolution of right pleural effusion. Moderate pleural effusion on the left with airspace consolidation throughout much of the left lung. Stable cardiomegaly. Bones osteoporotic. Electronically Signed   By: Bretta Bang III M.D.   On: 06/13/2015 11:45   US Thoracentesis Asp Pleural Space W/img Guide  06/13/2015  INDICATION: Two weeks ago was diagnosed with influenza. Readmitted for pneumonia. Patient noted to have a right-sided pleural effusion and a request has been made for a diagnostic and therapeutic thoracentesis. EXAM: ULTRASOUND GUIDED DIAGNOSTIC AND THERAPEUTIC THORACENTESIS MEDICATIONS: 1% lidocaine COMPLICATIONS: None immediate. PROCEDURE: An ultrasound guided thoracentesis was thoroughly discussed with the patient and questions answered. The benefits, risks, alternatives and complications were also discussed. The patient understands and wishes to proceed with the procedure. Written consent was obtained. Ultrasound was performed to localize and mark an adequate pocket of fluid in the right chest. The area was then prepped and draped in the normal sterile fashion. 1% Lidocaine was used for local anesthesia. A Safe-T-Centesis catheter was introduced. Thoracentesis was performed. The catheter was removed and a dressing applied. FINDINGS: A total of  approximately 0.36 L of yellow fluid was removed. Samples were sent to the laboratory as requested by the clinical team. IMPRESSION: Successful ultrasound guided right thoracentesis yielding 0.36 L of pleural fluid. Read by: Barnetta Chapel, PA-C Electronically Signed   By: Malachy Moan M.D.   On: 06/13/2015 11:45     Assessment/Plan: Influenza HCAP Fluid overload  Total days of antibiotics: 16 3-7 levaquin 3-14 3-15 tamiflu 3-19 3-15 clinda 3-19 3-17 vanco  3-20 aztreonam 3-20 flagyl3-21       Will stop vanco Plan to stop aztreonam tomorrow Appears to be improving Fluid mgmt Leukocytosis due to steroids Available as needed.     Johny Sax Infectious Diseases (pager) [phone] [email or web address] 06/15/2015, 10:45 AM  LOS: 16 days

## 2015-06-15 NOTE — Progress Notes (Signed)
[facility] PROGRESS NOTE  Tonya Farley ZOX:[id number] DOB: 1920-03-20 DOA: 05/30/2015 PCP: Lorenda Peck, MD  Summary Appreciate ID/pulmonary/IR. Tonya Farley is a pleasant 82 y.o. female with paroxysmal atrial fibrillation on coumadin who presented to the ED with c/o weakness and 4 day history of cough, nausea, decreased PO intake, generalized weakness and she was admitted for community acquired pneumonia/ Influenza type A. Patient has made very slow clinical progress despite antibiotics diuretics prompting 2 CT scans of the chest with suggestion of aspiration pneumonitis/fluid overload, ?HCAP. She had right sided pleural tap on 06/13/15, and 320cc of fluid was removed(analysis pending). Pleural tap has given her some respiratory relief. Although there is concern for aspiration pneumonitis, patient's family has declined swallow evaluation, and have insisted we liberalize her food. Of concern is that her white count has been trending up despite antibiotics, on and off steroids, hence ID/pulmonary consults. Will follow recommendations. Patient had INR reversed by vitamin k for pleural tap, and her coumadin can be resumed if no further tap contemplated. Patient's niece Tonya Farley has been very active in decision making and wants to be informed of changes to management. She had requested for ID to be primary and I discussed with Dr Ninetta Lights who assured me he will continue to follow along with the hopitalist service as a Research scientist (medical). We will follow ID/pulmonary/IR recommendations.  Plan CAP (community acquired pneumonia)/ bilateral pleural effusions/Dyspnea/ Influenza due to identified novel influenza A virus with other respiratory manifestations  S/p right sided Ultrasound-guided thoracentesis, pleural fluid analysis w/o growth up to date   Defer to ID for antibiotic management; but given lack of fever and neg cx's plan is to narrow/stopped soon. Patient has received a total of 16 days of  antibiotics  Follow pulmonary recommendations  Continue pulmicort, mucinex and flutter valve  Wean oxygen as tolerated and continue IV lasix as mentioned below for tx of CHF component   Acute on chroic diastolic CHF  Continue daily weights  Strict intake and output   Continue IV lasix  Will follow renal function closely  on heart healthy diet   Paroxysmal Atrial fibrillation (HCC)  Rate controlled  Resume Coumadin per pharmacy, for target INR 2-3  AKI (acute kidney injury) (HCC)  Some improvement on renal function appreciated  Will monitor renal function  Continue diuresis  Right arm swelling, no dvt  No dvt  Monitor   Continue diuresis  HLD: continue statins   Hypothyroidism: continue synthroid    Code Status: DNR Family Communication: No family at bedside  Disposition Plan: will ask PT to assess for safe discharge; continue narrowing/discotninue abx's; continue IV lasix    Consultants:  PCCM  ID  IR  Procedures:  Right sided thoracentesis 06/13/15  Antibiotics:  Per ID  HPI/Subjective: Feels better; no CP and breathing continue improving. Still with signs of fluid overload on exam.  Objective: Filed Vitals:   06/15/15 0548 06/15/15 1113  BP: 138/72 147/89  Pulse: 72   Temp: 98.9 F (37.2 C)   Resp: 20     Intake/Output Summary (Last 24 hours) at 06/15/15 1347 Last data filed at 06/15/15 0900  Gross per 24 hour  Intake    970 ml  Output      0 ml  Net    970 ml   Filed Weights   06/12/15 0435 06/13/15 0614 06/14/15 0459  Weight: 76 kg (167 lb 8.8 oz) 76.4 kg (168 lb 6.9 oz) 75.479 kg (166 lb 6.4 oz)  Exam:   General:  Comfortable at rest; patient is afebrile and with stable breathing. Denies CP. Endorses intermittent productive cough. Still with signs of fluid overload appreciated.  Cardiovascular: S1-S2 normal. No murmurs. Pulse regular.  Respiratory: positive diffuse rhonchi and bibasilar crackles; no frank  wheezing  Abdomen: Soft and nontender. Normal bowel sounds. No organomegaly.  Musculoskeletal: 2-3++ edema bilaterally affecting LE bilaterally and upper extremities   Neurological: Intact  Data Reviewed: Basic Metabolic Panel:  Recent Labs Lab 06/11/15 0614 06/12/15 0520 06/13/15 0544 06/13/15 1300 06/14/15 0518 06/15/15 0512  NA 142 140 136  --  138 139  K 4.7 4.2 4.4  --  4.3 4.5  CL 100* 99* 96*  --  98* 99*  CO2 31 32 30  --  32 31  GLUCOSE 216* 160* 154*  --  100* 102*  BUN 36* 36* 38*  --  40* 43*  CREATININE 1.15* 1.07* 1.01*  --  1.04* 1.07*  CALCIUM 9.2 9.0 8.9  --  9.1 8.8*  MG  --   --   --  2.1  --   --   PHOS  --   --   --  2.4*  --   --    Liver Function Tests:  Recent Labs Lab 06/10/15 0608 06/11/15 0614 06/12/15 0520 06/13/15 0544 06/14/15 0518  AST 11* 11* 11* 13* 14*  ALT 13* 12* 12* 12* 11*  ALKPHOS 43 48 51 49 55  BILITOT 0.8 0.9 0.7 0.8 1.0  PROT 5.6* 5.9* 6.1* 5.9* 5.8*  ALBUMIN 2.5* 2.6* 2.8* 2.6* 2.5*   CBC:  Recent Labs Lab 06/10/15 0608 06/11/15 0614 06/12/15 0520 06/13/15 0544 06/14/15 0518  WBC 14.2* 18.4* 19.1* 19.8* 19.2*  NEUTROABS 11.8* 17.0* 16.9* 17.4* 16.7*  HGB 10.3* 9.8* 9.9* 10.0* 10.8*  HCT 33.0* 31.4* 31.2* 31.8* 32.5*  MCV 87.5 87.2 86.9 86.6 82.7  PLT 246 257 240 245 272   BNP (last 3 results)  Recent Labs  05/30/15 1852 06/03/15 1611 06/14/15 0813  BNP 212.3* 358.1* 365.3*    Recent Results (from the past 240 hour(s))  Culture, Urine     Status: None   Collection Time: 06/09/15 10:14 AM  Result Value Ref Range Status   Specimen Description URINE, CLEAN CATCH  Final   Special Requests NONE  Final   Culture   Final    MULTIPLE SPECIES PRESENT, SUGGEST RECOLLECTION Performed at [facility]    Report Status 06/10/2015 FINAL  Final  Culture, body fluid-bottle     Status: None (Preliminary result)   Collection Time: 06/13/15 11:05 AM  Result Value Ref Range Status   Specimen Description  FLUID RIGHT PLEURAL  Final   Special Requests NONE  Final   Culture   Final    NO GROWTH 2 DAYS Performed at [facility]    Report Status PENDING  Incomplete  Gram stain     Status: None   Collection Time: 06/13/15 11:05 AM  Result Value Ref Range Status   Specimen Description FLUID RIGHT PLEURAL  Final   Special Requests NONE Performed at [facility]   Final   Gram Stain   Final    FEW WBC PRESENT,BOTH PMN AND MONONUCLEAR NO ORGANISMS SEEN    Report Status 06/13/2015 FINAL  Final     Studies: No results found.  Scheduled Meds: . antiseptic oral rinse  7 mL Mouth Rinse BID  . atorvastatin  10 mg Oral Daily  . aztreonam  2 g Intravenous 3 times per day  . brimonidine  1 drop Both Eyes TID  . budesonide (PULMICORT) nebulizer solution  0.5 mg Nebulization BID  . diltiazem  360 mg Oral Daily  . [START ON 06/16/2015] furosemide  30 mg Intravenous Q12H  . guaiFENesin  1,200 mg Oral BID  . ipratropium-albuterol  3 mL Nebulization QID  . labetalol  200 mg Oral BID  . latanoprost  1 drop Both Eyes QHS  . levothyroxine  100 mcg Oral QAC breakfast  . multivitamin with minerals  1 tablet Oral Daily  . pantoprazole  40 mg Oral Daily  . predniSONE  10 mg Oral Q breakfast  . timolol  1 drop Both Eyes q morning - 10a  . warfarin  3 mg Oral ONCE-1800  . Warfarin - Pharmacist Dosing Inpatient   Does not apply q1800   Continuous Infusions:    Time spent: 25 minutes    Vassie LollMadera, Gayla Benn  [facility] Pager [phone]. If 7PM-7AM, please contact night-coverage at [email or web address], password [email or web address] 06/15/2015, 1:47 PM  LOS: 16 days

## 2015-06-15 NOTE — Progress Notes (Signed)
Pharmacy Antibiotic Note  Tonya Farley is a 82 y.o. female admitted on 05/30/2015 with pneumonia.  She was previously on Vanc, Clinda, Levaquin, Tamiflu, then pharmacy was consulted for Vancomycin and Aztreonam dosing, MD dosing Flagy for suspected aspiration pneumonia.  ID has narrowed antibiotics and she remains on Aztreonam.   Plan:  Continue Aztreonam 2g IV q8h  Follow up narrowing of antibiotics per ID.  Follow up renal fxn, culture results, and clinical course.    Height: 5\' 4"  (162.6 cm) Weight: 166 lb 6.4 oz (75.479 kg) IBW/kg (Calculated) : 54.7  Temp (24hrs), Avg:98.5 F (36.9 C), Min:98.2 F (36.8 C), Max:98.9 F (37.2 C)   Recent Labs Lab 06/10/15 0608 06/11/15 0614 06/12/15 0520 06/13/15 0544 06/14/15 0518 06/15/15 0512  WBC 14.2* 18.4* 19.1* 19.8* 19.2*  --   CREATININE 1.18* 1.15* 1.07* 1.01* 1.04* 1.07*    Estimated Creatinine Clearance: 32.7 mL/min (by C-G formula based on Cr of 1.07).    Allergies  Allergen Reactions  . Penicillins Hives and Swelling       . Sulfa Antibiotics Anaphylaxis and Swelling    Antimicrobials this admission: Levaquin 3/7 >> 3/14 Clinda 3/15 >> 3/19 Tamiflu 3/15 >> 3/19 Vanc 3/17 >> 3/19 Flagyl 3/20 >> 3/21 Aztreonam 3/20 >> Vanc 3/20 >> 3/23  Dose adjustments this admission:  Microbiology results: 3/7 Strep pneumo Ur Ag: neg 3/8 BCx: NGF 3/8 Influenza PCR: A+ 3/17 UCx: suggest recollection FINAL 3/21 Pleural fluid: ngtd  Thank you for allowing pharmacy to be a part of this patient's care.  Lynann Beaverhristine Merari Pion PharmD, BCPS Pager [phone] 06/15/2015 12:56 PM

## 2015-06-15 NOTE — Progress Notes (Signed)
ANTICOAGULATION CONSULT NOTE   Pharmacy Consult for warfarin Indication: atrial fibrillation  Allergies  Allergen Reactions  . Penicillins Hives and Swelling       . Sulfa Antibiotics Anaphylaxis and Swelling    Patient Measurements: Height: 5\' 4"  (162.6 cm) Weight: 166 lb 6.4 oz (75.479 kg) IBW/kg (Calculated) : 54.7  Vital Signs: Temp: 98.9 F (37.2 C) (03/23 0548) Temp Source: Oral (03/23 0548) BP: 147/89 mmHg (03/23 1113) Pulse Rate: 72 (03/23 0548)  Labs:  Recent Labs  06/13/15 0544 06/14/15 0514 06/14/15 0518 06/15/15 0512  HGB 10.0*  --  10.8*  --   HCT 31.8*  --  32.5*  --   PLT 245  --  272  --   LABPROT 21.5* 18.4*  --  19.1*  INR 1.88* 1.53*  --  1.66*  CREATININE 1.01*  --  1.04* 1.07*    Estimated Creatinine Clearance: 32.7 mL/min (by C-G formula based on Cr of 1.07).  Assessment: [age] yoF on warfarin PTA for Afib who was admitted on 3/7 for pneumonia.  PTA Warfarin regimen reported as 3mg  daily except 2mg  MWF; INR 1.85 subtherapeutic on admission.  Pharmacy consulted to continue dosing of warfarin.  Significant events: 3/20: chest CT- increasing bilateral effusions.  Warfarin held and Vit K 5mg  IV x1 dose prior to thoracentesis.   3/21: Vit K 5 mg IV x1 given at 0930.  Thoracentesis performed w/o complications. 3/22 Warfarin dosing resumed.  Today, 06/15/2015:  INR 1.66, remains subtherapeutic but increasing  CBC: Hgb low but stable, Plt WNL (last on 3/22)  No bleeding documented  Diet: heart healthy, ~50- 100% meal intake charted.  Drug-drug interactions: Levaquin(from 3/6-3/14), Flagyl (3/20-3/21).  No significant drug-drug intxns noted at this time  Goal of Therapy:  INR 2-3   Plan:  Warfarin 3mg  PO today at 1800 Daily PT/INR. Monitor for signs and symptoms of bleeding.  Lynann Beaverhristine Ziya Coonrod PharmD, BCPS Pager [phone] 06/15/2015 11:26 AM

## 2015-06-15 NOTE — Progress Notes (Signed)
Name: Tonya MoatsMamie N Farley MRN: [id number] DOB: 07/20/1919    ADMISSION DATE:  05/30/2015 CONSULTATION DATE:  3/13  REFERRING MD :  TH  CHIEF COMPLAINT:  Cough initially,  PCCM reconsulted for SOB, B effusion, HCAP.   BRIEF PATIENT DESCRIPTION: 82yo female with hx HTN, AFib on coumadin, CKD III admitted 3/7 with PNA, flu A.   She was treated with IV abx and improving slowly.  PCCM consulted 3/13 for ongoing cough.  Pt improved with regards to her cough. She was given Abx and tamiflu.  Slow to improved.  The last week -- noted to be more SOB.  Chest ct scan with new B infiltrates and more pleural effusion, compared to baselines.   SIGNIFICANT EVENTS  3/7 admit 3/13 PCCM consulted for cough -- coguh got better. PCCM signed off 3/21 PCCM re-consulted for worse SOB. CT scan with B PNA and B effusion. Had R thoracentesis.   STUDIES:  Chest CT scan (3/20)  increasing bilateral effusion, increasing infiltrates compared to chest CT scan done on March 15. 2Decho (3/12) LVEF 55%, diastolic dysfunction, left atrial enlargement, atrial fibrillation.   SUBJECTIVE:  Pt feels better and is less SOB. Less cough. Slowly improving.   VITAL SIGNS: Temp:  [98.2 F (36.8 C)-98.9 F (37.2 C)] 98.9 F (37.2 C) (03/23 0548) Pulse Rate:  [72-81] 72 (03/23 0548) Resp:  [20] 20 (03/23 0548) BP: (126-147)/(55-89) 147/89 mmHg (03/23 1113) SpO2:  [97 %-100 %] 98 % (03/23 0830)  PHYSICAL EXAMINATION: General:  Pleasant elderly female, not in distress respiratory, sitting in bed Neuro:  Awake, alert, appropriate, CN grossly intact. (-) lateralizing signs.  HEENT:  Mm moist, no JVD  (-) oral thrush Cardiovascular:  s1s2 irreg  Lungs:   Crackles bilaterally mid to bases. Less crackles today.  Abdomen:  Round, soft, non tender  Musculoskeletal:  Warm and dry, BUE symmetric edema, (+) BLE edema    Recent Labs Lab 06/13/15 0544 06/14/15 0518 06/15/15 0512  NA 136 138 139  K 4.4 4.3 4.5  CL 96* 98* 99*    CO2 30 32 31  BUN 38* 40* 43*  CREATININE 1.01* 1.04* 1.07*  GLUCOSE 154* 100* 102*    Recent Labs Lab 06/12/15 0520 06/13/15 0544 06/14/15 0518  HGB 9.9* 10.0* 10.8*  HCT 31.2* 31.8* 32.5*  WBC 19.1* 19.8* 19.2*  PLT 240 245 272   No results found.  ASSESSMENT / PLAN: Pt with bilateral infiltrates, chest CT (06/12/15) scan findings worse compared to baseline (3/15). Also with worse pleural effusion, bilateral. Patient is status post thoracentesis on the right (3/20). They were able to drain 300 ML's clear fluid. Considerations include the following: 1. Aspiration pneumonitis/PNA. Patient denies any difficulty swallowing but she states that food stays in her mouth. 2. Healthcare associated pneumonia. Possible staph or gram-negative pneumonia. Patient has been admitted since March 13. She is at risk for healthcare associated pneumonia. 3. Recent influenza. 4. Volume overload. Pulmonary edema.  5. Diastolic dysfunction.  Plan : 1. Patient has clinically improved the last 2 days. Her breathing is better cough is better. 2. We do not have accurate output for this patient. Suggest to continue with the Lasix IV, continue daily diuresis. Majority of her breathing problem is related to the volume overload/or effusion. Once we discontinue antibiotics, she will have less intake. Once she seems euvolemic, plan to discontinue Lasix. 3. Antibiotics per ID. Plan to discontinue soon. 4. Cont duoneb qid and pulmicort BID.  5. cont IS and acapella.  6. Pt is DNR.  7. Observe for aspiration. Pt and nurse deny issues with swallowing. Will observe for now.  8. Keep O2 sats > 90%. Cont O2. 9. I anticipate she'll be discharged to short-term rehabilitation soon. 10. PCCM will sign off for now. Please call back if with any more questions or issues.Thanks.   Pollie Meyer, MD 06/15/2015, 12:02 PM [facility] Pager [phone] After 3 pm or if no answer, call  [phone]

## 2015-06-16 LAB — PROTIME-INR
INR: 1.7 — ABNORMAL HIGH (ref 0.00–1.49)
Prothrombin Time: 19.3 seconds — ABNORMAL HIGH (ref 11.6–15.2)

## 2015-06-16 LAB — BASIC METABOLIC PANEL
Anion gap: 9 (ref 5–15)
BUN: 44 mg/dL — AB (ref 6–20)
CHLORIDE: 99 mmol/L — AB (ref 101–111)
CO2: 32 mmol/L (ref 22–32)
CREATININE: 1.15 mg/dL — AB (ref 0.44–1.00)
Calcium: 9.1 mg/dL (ref 8.9–10.3)
GFR calc Af Amer: 46 mL/min — ABNORMAL LOW (ref >60)
GFR calc non Af Amer: 40 mL/min — ABNORMAL LOW (ref >60)
Glucose, Bld: 119 mg/dL — ABNORMAL HIGH (ref 65–99)
Potassium: 4.1 mmol/L (ref 3.5–5.1)
Sodium: 140 mmol/L (ref 135–145)

## 2015-06-16 MED ORDER — WARFARIN SODIUM 3 MG PO TABS
3.0000 mg | ORAL_TABLET | Freq: Once | ORAL | Status: AC
  Administered 2015-06-16: 3 mg via ORAL
  Filled 2015-06-16: qty 1

## 2015-06-16 NOTE — Progress Notes (Signed)
Physical Therapy Treatment Patient Details Name: Driscilla MoatsMamie N Gottsch MRN: [id number] DOB: 08/16/1919 Today's Date: 06/16/2015    History of Present Illness 82 yo female admitted with Pna. Hx of HTN, chronic back pain, A fib.     PT Comments    Progressing slowly with mobility. Pt participated well with session. Continue to recommend SNF  Follow Up Recommendations  SNF     Equipment Recommendations  None recommended by PT    Recommendations for Other Services       Precautions / Restrictions Precautions Precautions: Fall Precaution Comments: monitor stats Restrictions Weight Bearing Restrictions: No    Mobility  Bed Mobility Overal bed mobility: Needs Assistance Bed Mobility: Supine to Sit     Supine to sit: Min assist     General bed mobility comments: assist to get to EOB. Increased time.   Transfers Overall transfer level: Needs assistance Equipment used: Rolling walker (2 wheeled) Transfers: Sit to/from Stand Sit to Stand: Min assist Stand pivot transfers: Min assist       General transfer comment: Assist to rise, stabilize, control descent, maneuver safety with RW. Stand pivot from bed to bsc.  Ambulation/Gait Ambulation/Gait assistance: Min assist Ambulation Distance (Feet): 5 Feet Assistive device: Rolling walker (2 wheeled) Gait Pattern/deviations: Step-through pattern;Decreased stride length;Trunk flexed     General Gait Details: assist to stabilize pt and maneuver with RW. Dyspnea 2/4. Remained on 2L [place] O2 during session. Fatigues quickly   Information systems managertairs            Wheelchair Mobility    Modified Rankin (Stroke Patients Only)       Balance           Standing balance support: Bilateral upper extremity supported;During functional activity Standing balance-Leahy Scale: Poor                      Cognition Arousal/Alertness: Awake/alert Behavior During Therapy: WFL for tasks assessed/performed Overall Cognitive Status: Within  Functional Limits for tasks assessed                      Exercises General Exercises - Upper Extremity Shoulder Flexion: AROM;Both;15 reps;Seated Elbow Flexion: AROM;Both;15 reps;Seated Elbow Extension: AROM;15 reps;Seated General Exercises - Lower Extremity Ankle Circles/Pumps: AROM;Both;15 reps;Supine Quad Sets: AROM;Both;15 reps;Supine Long Arc Quad: AROM;Both;15 reps;Seated Hip Flexion/Marching: AROM;Both;15 reps;Seated    General Comments        Pertinent Vitals/Pain Pain Assessment: No/denies pain    Home Living                      Prior Function            PT Goals (current goals can now be found in the care plan section) Progress towards PT goals: Progressing toward goals    Frequency  Min 3X/week    PT Plan Current plan remains appropriate    Co-evaluation             End of Session Equipment Utilized During Treatment: Oxygen Activity Tolerance: Patient limited by fatigue Patient left: in chair;with call bell/phone within reach;with chair alarm set     Time: [id number] PT Time Calculation (min) (ACUTE ONLY): 25 min  Charges:  $Therapeutic Exercise: 8-22 mins $Therapeutic Activity: 8-22 mins                    G Codes:      Rebeca AlertJannie Ranulfo Kall, MPT Pager: [phone]

## 2015-06-16 NOTE — Clinical Social Work Placement (Signed)
CSW confirmed with Herbert SetaHeather at [facility] that they would be able to take patient over the weekend if ready.   Please call weekend CSW (ph#: [phone]) to facilitate discharge.      Lincoln MaxinKelly Kameah Rawl, LCSW [facility] Clinical Social Worker cell #: [phone]   CLINICAL SOCIAL WORK PLACEMENT  NOTE  Date:  06/16/2015  Patient Details  Name: Tonya MoatsMamie N Chrisley MRN: [id number] Date of Birth: 11/05/1919  Clinical Social Work is seeking post-discharge placement for this patient at the Skilled  Nursing Facility level of care (*CSW will initial, date and re-position this form in  chart as items are completed):  Yes   Patient/family provided with [facility] Clinical Social Work Department's list of facilities offering this level of care within the geographic area requested by the patient (or if unable, by the patient's family).  Yes   Patient/family informed of their freedom to choose among providers that offer the needed level of care, that participate in Medicare, Medicaid or managed care program needed by the patient, have an available bed and are willing to accept the patient.  Yes   Patient/family informed of [facility]'s ownership interest in [facility] and [facility], as well as of the fact that they are under no obligation to receive care at these facilities.  PASRR submitted to EDS on 06/01/15     PASRR number received on 06/01/15     Existing PASRR number confirmed on 06/01/15     FL2 transmitted to all facilities in geographic area requested by pt/family on 06/01/15     FL2 transmitted to all facilities within larger geographic area on       Patient informed that his/her managed care company has contracts with or will negotiate with certain facilities, including the following:        Yes   Patient/family informed of bed offers received.  Patient chooses bed at [facility]     Physician recommends and patient  chooses bed at      Patient to be transferred to [facility] on  .  Patient to be transferred to facility by [facility]     Patient family notified on   of transfer.  Name of family member notified:        PHYSICIAN       Additional Comment:    _______________________________________________ Arlyss RepressHarrison, Ralph Benavidez F, LCSW 06/16/2015, 2:00 PM

## 2015-06-16 NOTE — Progress Notes (Signed)
[facility] PROGRESS NOTE  Tonya Farley BJY:[id number] DOB: 04/18/1919 DOA: 05/30/2015 PCP: Tonya PeckOBERTS, Tonya WAYNE, MD  Summary Appreciate ID/pulmonary/IR. Tonya Farley is a pleasant 82 y.o. female with paroxysmal atrial fibrillation on coumadin who presented to the ED with c/o weakness and 4 day history of cough, nausea, decreased PO intake, generalized weakness and she was admitted for community acquired pneumonia/ Influenza type A. Patient has made very slow clinical progress despite antibiotics diuretics prompting 2 CT scans of the chest with suggestion of aspiration pneumonitis/fluid overload, ?HCAP. She had right sided pleural tap on 06/13/15, and 320cc of fluid was removed(analysis pending). Pleural tap has given her some respiratory relief. Although there is concern for aspiration pneumonitis, patient's family has declined swallow evaluation, and have insisted we liberalize her food. Of concern is that her white count has been trending up despite antibiotics, on and off steroids, hence ID/pulmonary consults. Will follow recommendations. Patient had INR reversed by vitamin k for pleural tap, and her coumadin can be resumed if no further tap contemplated. Patient's niece Tonya HongJudy has been very active in decision making and wants to be informed of changes to management. She had requested for ID to be primary and I discussed with Dr Tonya LightsHatcher who assured me he will continue to follow along with the hopitalist service as a Research scientist (medical)consultant. We will follow ID/pulmonary/IR recommendations.  Plan CAP (community acquired pneumonia)/ bilateral pleural effusions/Dyspnea/ Influenza due to identified novel influenza A virus with other respiratory manifestations  S/p right sided Ultrasound-guided thoracentesis, pleural fluid analysis w/o growth up to date   Defer to ID for antibiotic management; but given lack of fever and neg cx's plan is to narrow/stopped them after today's dose. Patient has received a total  of 16 days of antibiotics  Follow pulmonary recommendations  Continue pulmicort, mucinex and flutter valve  Wean oxygen as tolerated and continue IV lasix as mentioned below for tx of CHF component   will repeat CXR 2 views in am  Acute on chroic diastolic CHF  Continue daily weights  Strict intake and output   Continue IV lasix  Will follow renal function closely  on heart healthy diet   Paroxysmal Atrial fibrillation (HCC)  Rate controlled  Resume Coumadin per pharmacy, for target INR 2-3  AKI (acute kidney injury) (HCC)  Some improvement on renal function appreciated  Will monitor renal function  Continue diuresis  Right arm swelling, no dvt  No dvt  Monitor   Continue diuresis  HLD: continue statins   Hypothyroidism: continue synthroid    Code Status: DNR Family Communication: No family at bedside  Disposition Plan: will ask PT to assess for safe discharge; continue narrowing/discotninue abx's; continue IV lasix    Consultants:  PCCM  ID  IR  Procedures:  Right sided thoracentesis 06/13/15  Antibiotics:  Per ID  HPI/Subjective: Feels better; no CP and breathing continue improving. Still with signs of fluid overload on exam; but much improved..no nausea, no vomiting.  Objective: Filed Vitals:   06/16/15 1024 06/16/15 1406  BP: 144/65 133/54  Pulse:  70  Temp:  98.1 F (36.7 C)  Resp:  20    Intake/Output Summary (Last 24 hours) at 06/16/15 1824 Last data filed at 06/16/15 1423  Gross per 24 hour  Intake    150 ml  Output    100 ml  Net     50 ml   Filed Weights   06/13/15 0614 06/14/15 0459 06/16/15 0430  Weight: 76.4 kg (168 lb  6.9 oz) 75.479 kg (166 lb 6.4 oz) 75.025 kg (165 lb 6.4 oz)    Exam:   General:  Comfortable at rest; patient is afebrile and with improved breathing. Denies CP. Endorses intermittent productive cough is subsiding. Still with signs of fluid overload on exam, but much improved.    Cardiovascular: S1-S2 normal. No murmurs. Pulse regular.  Respiratory: positive diffuse rhonchi and bibasilar crackles; no frank wheezing  Abdomen: Soft and nontender. Normal bowel sounds. No organomegaly.  Musculoskeletal: 2+ edema bilaterally affecting LE bilaterally and upper extremities   Neurological: Intact  Data Reviewed: Basic Metabolic Panel:  Recent Labs Lab 06/12/15 0520 06/13/15 0544 06/13/15 1300 06/14/15 0518 06/15/15 0512 06/16/15 0512  NA 140 136  --  138 139 140  K 4.2 4.4  --  4.3 4.5 4.1  CL 99* 96*  --  98* 99* 99*  CO2 32 30  --  32 31 32  GLUCOSE 160* 154*  --  100* 102* 119*  BUN 36* 38*  --  40* 43* 44*  CREATININE 1.07* 1.01*  --  1.04* 1.07* 1.15*  CALCIUM 9.0 8.9  --  9.1 8.8* 9.1  MG  --   --  2.1  --   --   --   PHOS  --   --  2.4*  --   --   --    Liver Function Tests:  Recent Labs Lab 06/10/15 0608 06/11/15 0614 06/12/15 0520 06/13/15 0544 06/14/15 0518  AST 11* 11* 11* 13* 14*  ALT 13* 12* 12* 12* 11*  ALKPHOS 43 48 51 49 55  BILITOT 0.8 0.9 0.7 0.8 1.0  PROT 5.6* 5.9* 6.1* 5.9* 5.8*  ALBUMIN 2.5* 2.6* 2.8* 2.6* 2.5*   CBC:  Recent Labs Lab 06/10/15 0608 06/11/15 0614 06/12/15 0520 06/13/15 0544 06/14/15 0518  WBC 14.2* 18.4* 19.1* 19.8* 19.2*  NEUTROABS 11.8* 17.0* 16.9* 17.4* 16.7*  HGB 10.3* 9.8* 9.9* 10.0* 10.8*  HCT 33.0* 31.4* 31.2* 31.8* 32.5*  MCV 87.5 87.2 86.9 86.6 82.7  PLT 246 257 240 245 272   BNP (last 3 results)  Recent Labs  05/30/15 1852 06/03/15 1611 06/14/15 0813  BNP 212.3* 358.1* 365.3*    Recent Results (from the past 240 hour(s))  Culture, Urine     Status: None   Collection Time: 06/09/15 10:14 AM  Result Value Ref Range Status   Specimen Description URINE, CLEAN CATCH  Final   Special Requests NONE  Final   Culture   Final    MULTIPLE SPECIES PRESENT, SUGGEST RECOLLECTION Performed at [facility]    Report Status 06/10/2015 FINAL  Final  Culture, body fluid-bottle      Status: None (Preliminary result)   Collection Time: 06/13/15 11:05 AM  Result Value Ref Range Status   Specimen Description FLUID RIGHT PLEURAL  Final   Special Requests NONE  Final   Culture   Final    NO GROWTH 3 DAYS Performed at [facility]    Report Status PENDING  Incomplete  Gram stain     Status: None   Collection Time: 06/13/15 11:05 AM  Result Value Ref Range Status   Specimen Description FLUID RIGHT PLEURAL  Final   Special Requests NONE Performed at [facility]   Final   Gram Stain   Final    FEW WBC PRESENT,BOTH PMN AND MONONUCLEAR NO ORGANISMS SEEN    Report Status 06/13/2015 FINAL  Final     Studies: No results found.  Scheduled Meds: . antiseptic oral rinse  7 mL Mouth Rinse BID  . atorvastatin  10 mg Oral Daily  . aztreonam  2 g Intravenous 3 times per day  . brimonidine  1 drop Both Eyes TID  . budesonide (PULMICORT) nebulizer solution  0.5 mg Nebulization BID  . diltiazem  360 mg Oral Daily  . furosemide  30 mg Intravenous Q12H  . guaiFENesin  1,200 mg Oral BID  . ipratropium-albuterol  3 mL Nebulization QID  . labetalol  200 mg Oral BID  . latanoprost  1 drop Both Eyes QHS  . levothyroxine  100 mcg Oral QAC breakfast  . multivitamin with minerals  1 tablet Oral Daily  . pantoprazole  40 mg Oral Daily  . predniSONE  10 mg Oral Q breakfast  . timolol  1 drop Both Eyes q morning - 10a  . Warfarin - Pharmacist Dosing Inpatient   Does not apply q1800   Continuous Infusions:    Time spent: 25 minutes    Vassie Loll  [facility] Pager [phone]. If 7PM-7AM, please contact night-coverage at [email or web address], password [facility] 06/16/2015, 6:24 PM  LOS: 17 days

## 2015-06-16 NOTE — Progress Notes (Signed)
ANTICOAGULATION CONSULT NOTE   Pharmacy Consult for warfarin Indication: atrial fibrillation  Allergies  Allergen Reactions  . Penicillins Hives and Swelling       . Sulfa Antibiotics Anaphylaxis and Swelling    Patient Measurements: Height: 5\' 4"  (162.6 cm) Weight: 165 lb 6.4 oz (75.025 kg) IBW/kg (Calculated) : 54.7  Vital Signs: Temp: 97.4 F (36.3 C) (03/24 0430) Temp Source: Oral (03/24 0430) BP: 126/54 mmHg (03/24 0430) Pulse Rate: 79 (03/24 0430)  Labs:  Recent Labs  06/14/15 0514 06/14/15 0518 06/15/15 0512 06/16/15 0512  HGB  --  10.8*  --   --   HCT  --  32.5*  --   --   PLT  --  272  --   --   LABPROT 18.4*  --  19.1* 19.3*  INR 1.53*  --  1.66* 1.70*  CREATININE  --  1.04* 1.07* 1.15*    Estimated Creatinine Clearance: 30.3 mL/min (by C-G formula based on Cr of 1.15).  Assessment: [age] yoF on warfarin PTA for Afib who was admitted on 3/7 for pneumonia.  PTA Warfarin regimen reported as 3mg  daily except 2mg  MWF; INR 1.85 subtherapeutic on admission.  Pharmacy consulted to continue dosing of warfarin.  Significant events: 3/20: chest CT- increasing bilateral effusions.  Warfarin held and Vit K 5mg  IV x1 dose prior to thoracentesis.   3/21: Vit K 5 mg IV x1 given at 0930.  Thoracentesis performed w/o complications. 3/22 Warfarin dosing resumed.  Today, 06/16/2015:  INR 1.70, remains subtherapeutic but increasing  CBC: Hgb low but stable, Plt WNL (last on 3/22)  No bleeding documented  Diet: heart healthy, ~50- 100% meal intake charted.  Drug-drug interactions: Levaquin(from 3/6-3/14), Flagyl (3/20-3/21).  No significant drug-drug intxns noted at this time  Goal of Therapy:  INR 2-3   Plan:  - repeat Warfarin 3mg  PO today at 1800 - Daily PT/INR. - Monitor for signs and symptoms of bleeding.  Dorna LeitzAnh Reighn Kaplan, PharmD, BCPS 06/16/2015 9:14 AM

## 2015-06-17 ENCOUNTER — Inpatient Hospital Stay (HOSPITAL_COMMUNITY): Payer: Medicare Other

## 2015-06-17 LAB — BASIC METABOLIC PANEL
Anion gap: 13 (ref 5–15)
BUN: 44 mg/dL — AB (ref 6–20)
CALCIUM: 9.3 mg/dL (ref 8.9–10.3)
CO2: 31 mmol/L (ref 22–32)
CREATININE: 1.13 mg/dL — AB (ref 0.44–1.00)
Chloride: 99 mmol/L — ABNORMAL LOW (ref 101–111)
GFR calc Af Amer: 47 mL/min — ABNORMAL LOW (ref >60)
GFR, EST NON AFRICAN AMERICAN: 41 mL/min — AB (ref >60)
GLUCOSE: 125 mg/dL — AB (ref 65–99)
Potassium: 4.1 mmol/L (ref 3.5–5.1)
Sodium: 143 mmol/L (ref 135–145)

## 2015-06-17 LAB — PROTIME-INR
INR: 1.81 — AB (ref 0.00–1.49)
PROTHROMBIN TIME: 20.3 s — AB (ref 11.6–15.2)

## 2015-06-17 MED ORDER — WARFARIN SODIUM 3 MG PO TABS
3.0000 mg | ORAL_TABLET | Freq: Once | ORAL | Status: AC
  Administered 2015-06-17: 3 mg via ORAL
  Filled 2015-06-17: qty 1

## 2015-06-17 MED ORDER — IPRATROPIUM-ALBUTEROL 0.5-2.5 (3) MG/3ML IN SOLN
3.0000 mL | Freq: Three times a day (TID) | RESPIRATORY_TRACT | Status: DC
  Administered 2015-06-17 – 2015-06-19 (×6): 3 mL via RESPIRATORY_TRACT
  Filled 2015-06-17 (×6): qty 3

## 2015-06-17 NOTE — Progress Notes (Signed)
[facility] PROGRESS NOTE  Tonya Farley ZOX:[id number] DOB: 1919/06/12 DOA: 05/30/2015 PCP: Lorenda Peck, MD  Summary Appreciate ID/pulmonary/IR. Tonya Farley is a pleasant 82 y.o. female with paroxysmal atrial fibrillation on coumadin who presented to the ED with c/o weakness and 4 day history of cough, nausea, decreased PO intake, generalized weakness and she was admitted for community acquired pneumonia/ Influenza type A. Patient has made very slow clinical progress despite antibiotics diuretics prompting 2 CT scans of the chest with suggestion of aspiration pneumonitis/fluid overload, ?HCAP. She had right sided pleural tap on 06/13/15, and 320cc of fluid was removed(analysis pending). Pleural tap has given her some respiratory relief. Although there is concern for aspiration pneumonitis, patient's family has declined swallow evaluation, and have insisted we liberalize her food. Of concern is that her white count has been trending up despite antibiotics, on and off steroids, hence ID/pulmonary consults. Will follow recommendations. Patient had INR reversed by vitamin k for pleural tap, and her coumadin can be resumed if no further tap contemplated. Patient's niece Tonya Farley has been very active in decision making and wants to be informed of changes to management. She had requested for ID to be primary and I discussed with Dr Ninetta Lights who assured me he will continue to follow along with the hopitalist service as a Research scientist (medical). We will follow ID/pulmonary/IR recommendations.  Plan CAP (community acquired pneumonia)/ bilateral pleural effusions/Dyspnea/ Influenza due to identified novel influenza A virus with other respiratory manifestations  S/p right sided Ultrasound-guided thoracentesis, pleural fluid analysis w/o growth up to date   Patient has received a total of 18 days of antibiotics. No fever and per ID rec's will continue watching her off abx's  Continue pulmicort, mucinex and  flutter valve  Wean oxygen as tolerated and continue IV lasix as mentioned below for tx of CHF component   Repeated CXR demonstrated improved aeration and decrease effusion/congestion  Acute on chroic diastolic CHF  Continue daily weights  Strict intake and output   Continue IV lasix; still with fluid overload feature on exam; but improving   Will follow renal function closely  on heart healthy diet   Paroxysmal Atrial fibrillation (HCC)  Rate controlled  Resume Coumadin per pharmacy, for target INR 2-3  AKI (acute kidney injury) (HCC)  Some improvement on renal function appreciated  Will monitor renal function trend, so far stable  Continue diuresis  Right arm swelling, no dvt  No dvt  Monitor   Continue diuresis  HLD: continue statins   Hypothyroidism: continue synthroid    Code Status: DNR Family Communication: No family at bedside  Disposition Plan: Physical therapy recommending SNF; patient doing better, but still with some fluid overload. Will continue IV lasix today and reassess tomorrow for potential discharge.   Consultants:  PCCM  ID  IR  Procedures:  Right sided thoracentesis 06/13/15  Antibiotics:  Per ID  HPI/Subjective: Feels better; no CP and breathing continue improving. Still with signs of fluid overload on exam; but much improved..no nausea, no vomiting.  Objective: Filed Vitals:   06/17/15 0510 06/17/15 1515  BP: 139/70 139/81  Pulse: 93 65  Temp: 97.5 F (36.4 C) 97.5 F (36.4 C)  Resp: 20 18    Intake/Output Summary (Last 24 hours) at 06/17/15 1731 Last data filed at 06/17/15 0900  Gross per 24 hour  Intake    240 ml  Output      0 ml  Net    240 ml   [employer]  06/14/15 0459 06/16/15 0430 06/17/15 0510  Weight: 75.479 kg (166 lb 6.4 oz) 75.025 kg (165 lb 6.4 oz) 72.53 kg (159 lb 14.4 oz)    Exam:   General:  Comfortable at rest; patient has remained afebrile and with steady improvement on her  breathing. Denies CP and Endorses improvement in swelling. Still with signs of fluid overload on exam, but much improved.   Cardiovascular: S1-S2 normal. No murmurs. Pulse regular.  Respiratory: positive diffuse rhonchi and mild bibasilar crackles; no frank wheezing  Abdomen: Soft and nontender. Normal bowel sounds. No organomegaly.  Musculoskeletal: 1-2+ edema bilaterally affecting LE bilaterally and upper extremities   Neurological: Intact  Data Reviewed: Basic Metabolic Panel:  Recent Labs Lab 06/13/15 0544 06/13/15 1300 06/14/15 0518 06/15/15 0512 06/16/15 0512 06/17/15 0514  NA 136  --  138 139 140 143  K 4.4  --  4.3 4.5 4.1 4.1  CL 96*  --  98* 99* 99* 99*  CO2 30  --  32 31 32 31  GLUCOSE 154*  --  100* 102* 119* 125*  BUN 38*  --  40* 43* 44* 44*  CREATININE 1.01*  --  1.04* 1.07* 1.15* 1.13*  CALCIUM 8.9  --  9.1 8.8* 9.1 9.3  MG  --  2.1  --   --   --   --   PHOS  --  2.4*  --   --   --   --    Liver Function Tests:  Recent Labs Lab 06/11/15 0614 06/12/15 0520 06/13/15 0544 06/14/15 0518  AST 11* 11* 13* 14*  ALT 12* 12* 12* 11*  ALKPHOS 48 51 49 55  BILITOT 0.9 0.7 0.8 1.0  PROT 5.9* 6.1* 5.9* 5.8*  ALBUMIN 2.6* 2.8* 2.6* 2.5*   CBC:  Recent Labs Lab 06/11/15 0614 06/12/15 0520 06/13/15 0544 06/14/15 0518  WBC 18.4* 19.1* 19.8* 19.2*  NEUTROABS 17.0* 16.9* 17.4* 16.7*  HGB 9.8* 9.9* 10.0* 10.8*  HCT 31.4* 31.2* 31.8* 32.5*  MCV 87.2 86.9 86.6 82.7  PLT 257 240 245 272   BNP (last 3 results)  Recent Labs  05/30/15 1852 06/03/15 1611 06/14/15 0813  BNP 212.3* 358.1* 365.3*    Recent Results (from the past 240 hour(s))  Culture, Urine     Status: None   Collection Time: 06/09/15 10:14 AM  Result Value Ref Range Status   Specimen Description URINE, CLEAN CATCH  Final   Special Requests NONE  Final   Culture   Final    MULTIPLE SPECIES PRESENT, SUGGEST RECOLLECTION Performed at [facility]    Report Status 06/10/2015  FINAL  Final  Culture, body fluid-bottle     Status: None (Preliminary result)   Collection Time: 06/13/15 11:05 AM  Result Value Ref Range Status   Specimen Description FLUID RIGHT PLEURAL  Final   Special Requests NONE  Final   Culture   Final    NO GROWTH 4 DAYS Performed at [facility]    Report Status PENDING  Incomplete  Gram stain     Status: None   Collection Time: 06/13/15 11:05 AM  Result Value Ref Range Status   Specimen Description FLUID RIGHT PLEURAL  Final   Special Requests NONE Performed at [facility]   Final   Gram Stain   Final    FEW WBC PRESENT,BOTH PMN AND MONONUCLEAR NO ORGANISMS SEEN    Report Status 06/13/2015 FINAL  Final     Studies: Dg Chest 2  View  06/17/2015  CLINICAL DATA:  82 year old female with a history of shortness of breath EXAM: CHEST - 2 VIEW COMPARISON:  06/13/2015, CT 06/12/2015 FINDINGS: Cardiomediastinal silhouette unchanged. Improving aeration of the left chest, with persistent opacity in the retrocardiac region obscuring the left hemidiaphragm and partial left heart border. No pneumothorax. Mixed interstitial and airspace opacities on the right, similar to prior. Low lung volumes. Atherosclerosis. IMPRESSION: Improving left-sided aeration, with persisting opacity at the left base, suggesting residual pneumonia, edema, and/ or consolidation. Small left pleural effusion persists. Signed, Yvone NeuJaime S. Loreta AveWagner, DO [facility] Electronically Signed   By: Gilmer MorJaime  Wagner D.O.   On: 06/17/2015 10:23    Scheduled Meds: . antiseptic oral rinse  7 mL Mouth Rinse BID  . atorvastatin  10 mg Oral Daily  . brimonidine  1 drop Both Eyes TID  . budesonide (PULMICORT) nebulizer solution  0.5 mg Nebulization BID  . diltiazem  360 mg Oral Daily  . furosemide  30 mg Intravenous Q12H  . guaiFENesin  1,200 mg Oral BID  . ipratropium-albuterol  3 mL Nebulization TID  . labetalol  200 mg  Oral BID  . latanoprost  1 drop Both Eyes QHS  . levothyroxine  100 mcg Oral QAC breakfast  . multivitamin with minerals  1 tablet Oral Daily  . pantoprazole  40 mg Oral Daily  . predniSONE  10 mg Oral Q breakfast  . timolol  1 drop Both Eyes q morning - 10a  . Warfarin - Pharmacist Dosing Inpatient   Does not apply q1800   Continuous Infusions:    Time spent: 25 minutes    Vassie LollMadera, Kaylee Trivett  [facility] Pager [phone]. If 7PM-7AM, please contact night-coverage at [email or web address], password [email or web address] 06/17/2015, 5:31 PM  LOS: 18 days

## 2015-06-17 NOTE — Progress Notes (Signed)
ANTICOAGULATION CONSULT NOTE   Pharmacy Consult for warfarin Indication: atrial fibrillation  Allergies  Allergen Reactions  . Penicillins Hives and Swelling       . Sulfa Antibiotics Anaphylaxis and Swelling    Patient Measurements: Height: 5\' 4"  (162.6 cm) Weight: 159 lb 14.4 oz (72.53 kg) IBW/kg (Calculated) : 54.7  Vital Signs: Temp: 97.5 F (36.4 C) (03/25 0510) Temp Source: Oral (03/25 0510) BP: 139/70 mmHg (03/25 0510) Pulse Rate: 93 (03/25 0510)  Labs:  Recent Labs  06/15/15 0512 06/16/15 0512 06/17/15 0514  LABPROT 19.1* 19.3* 20.3*  INR 1.66* 1.70* 1.81*  CREATININE 1.07* 1.15* 1.13*    Estimated Creatinine Clearance: 30.3 mL/min (by C-G formula based on Cr of 1.13).  Assessment: [age] yoF on warfarin PTA for Afib who was admitted on 3/7 for pneumonia.  PTA Warfarin regimen reported as 3mg  daily except 2mg  MWF; INR 1.85 subtherapeutic on admission.  Pharmacy consulted to continue dosing of warfarin.  Significant events: 3/20: chest CT- increasing bilateral effusions.  Warfarin held and Vit K 5mg  IV x1 dose prior to thoracentesis.   3/21: Vit K 5 mg IV x1 given at 0930.  Thoracentesis performed w/o complications. 3/22 Warfarin dosing resumed.  Today, 06/17/2015:  INR 1.81, remains subtherapeutic but increasing nicely towards goal range  CBC: Hgb low but stable, Plt WNL (last on 3/22)  No bleeding documented  Diet: heart healthy, ~80- 100% meal intake charted.  Drug-drug interactions: Levaquin(from 3/6-3/14), Flagyl (3/20-3/21).  No significant drug-drug intxns noted at this time  Goal of Therapy:  INR 2-3   Plan:  - repeat Warfarin 3mg  PO today at 1800  - Daily PT/INR. - Monitor for signs and symptoms of bleeding.  Dorna LeitzAnh Nichole Neyer, PharmD, BCPS 06/17/2015 9:16 AM

## 2015-06-18 DIAGNOSIS — J9621 Acute and chronic respiratory failure with hypoxia: Secondary | ICD-10-CM

## 2015-06-18 LAB — PROTIME-INR
INR: 1.6 — AB (ref 0.00–1.49)
PROTHROMBIN TIME: 18.5 s — AB (ref 11.6–15.2)

## 2015-06-18 LAB — CULTURE, BODY FLUID W GRAM STAIN -BOTTLE: Culture: NO GROWTH

## 2015-06-18 LAB — CULTURE, BODY FLUID-BOTTLE

## 2015-06-18 MED ORDER — PANTOPRAZOLE SODIUM 40 MG PO TBEC: 40.0000 mg | DELAYED_RELEASE_TABLET | Freq: Every day | ORAL | Status: DC

## 2015-06-18 MED ORDER — PREDNISONE 10 MG PO TABS: 10.0000 mg | ORAL_TABLET | Freq: Every day | ORAL | Status: DC

## 2015-06-18 MED ORDER — BENZONATATE 200 MG PO CAPS: 200.0000 mg | ORAL_CAPSULE | Freq: Three times a day (TID) | ORAL | Status: DC | PRN

## 2015-06-18 MED ORDER — FUROSEMIDE 40 MG PO TABS: 60.0000 mg | ORAL_TABLET | Freq: Every day | ORAL | Status: DC

## 2015-06-18 MED ORDER — BUDESONIDE 0.5 MG/2ML IN SUSP: 0.5000 mg | Freq: Two times a day (BID) | RESPIRATORY_TRACT | Status: DC

## 2015-06-18 MED ORDER — GUAIFENESIN ER 600 MG PO TB12: 600.0000 mg | ORAL_TABLET | Freq: Two times a day (BID) | ORAL | Status: DC

## 2015-06-18 MED ORDER — ACETAMINOPHEN 325 MG PO TABS: 650.0000 mg | ORAL_TABLET | Freq: Four times a day (QID) | ORAL | Status: DC | PRN

## 2015-06-18 MED ORDER — WARFARIN SODIUM 4 MG PO TABS
4.0000 mg | ORAL_TABLET | Freq: Once | ORAL | Status: AC
  Administered 2015-06-18: 4 mg via ORAL
  Filled 2015-06-18: qty 1

## 2015-06-18 NOTE — Discharge Summary (Addendum)
Physician Discharge Summary  Tonya MoatsMamie N Farley ZOX:[id number] DOB: 07/24/1919 DOA: 05/30/2015  PCP: Tonya Farley  Admit date: 05/30/2015 Discharge date: 06/19/2015  Time spent: 35 minutes  Recommendations for Outpatient Follow-up:  1. BMET in 5 days to follow electrolytes and renal function 2. CBC in 5 days to follow WBC's trend 3. CXR in 3 weeks to follow resolution on infiltrates and effusion  4. Follow INR closely and adjust coumadin dose as needed    Discharge Diagnoses:  Principal Problem:   CAP (community acquired pneumonia) Active Problems:   Atrial fibrillation (HCC)   Pleural effusion, bacterial   Dyspnea   AKI (acute kidney injury) (HCC)   Influenza due to identified novel influenza A virus with other respiratory manifestations   Cough   HCAP (healthcare-associated pneumonia)   Chest congestion   SOB (shortness of breath)   Swelling   Acute on chronic diastolic heart failure (HCC)   HLD (hyperlipidemia)   Pleural effusion   Physical deconditioning   Discharge Condition: stable and improved. Plan is to discharge back to SNF for further care and rehabilitation. Follow up with PCP in 10 days.   Diet recommendation: low sodium heart healthy diet   Filed Weights   06/16/15 0430 06/17/15 0510 06/18/15 0529  Weight: 75.025 kg (165 lb 6.4 oz) 72.53 kg (159 lb 14.4 oz) 70.852 kg (156 lb 3.2 oz)    History of present illness:  As per H&P written by Dr. Julian Farley on 05/30/15 82 y.o. female who presents to the ED with c/o weakness. She has had 4 day history of cough, nausea, decreased PO intake, generalized weakness. All of this onset late last week as a URI which then moved down into her chest. She saw her PCP yesterday who did a CXR (which is available on our Epic system) which demonstrated BLL PNA), labs were drawn this morning which showed dehydration with K of 2.5, elevated BUN. Patient was sent to the ED. Daughter gave patient Gatorade in the mean  time.  Hospital Course:  CAP (community acquired pneumonia)/ bilateral pleural effusions/Dyspnea/ Influenza due to identified novel influenza A virus with other respiratory manifestations  S/p right sided Ultrasound-guided thoracentesis, pleural fluid analysis w/o growth up to date   Patient has received a total of 18 days of antibiotics. No fever and per ID rec's will continue watching her off abx's  Continue pulmicort, daily low dose of prednisone, mucinex and use of flutter valve  Repeated CXR demonstrated improved aeration and decrease effusion/congestion  Repeat another CXR in 3 weeks approx to follow on resolution of infiltrates and effusion   WBC's elevated due to steroids most likely, follow trend with CBC in 5 days.  Acute on chroic diastolic CHF  Continue daily weights  Close follow up on patient water intake/volume  Continue lasix PO, 60mg  daily   Continue low sodium diet  Close follow up of renal function and electrolytes   Paroxysmal Atrial fibrillation (HCC)  Rate controlled  Continue coumadin and follow INR as an outpatient for further adjustments, target INR 2-3  AKI (acute kidney injury) (HCC)  Some improvement on renal function appreciated  Will recommend repeating BMET in 5 days to follow renal function trend, so far stable  Lasix now transition to PO (dose adjusted)  Right arm swelling, no dvt  No dvt appreciated on duplex  Swelling improved with diuresis  Keep arm elevated as much as possible and follow volume status and daily weights  Low sodium diet recommended  HLD: continue statins   Hypothyroidism: continue synthroid    Procedures:  Right sided thoracentesis 06/13/15  See below for x-ray reports   Consultations:  PCCM  IR  ID  Discharge Exam: Filed Vitals:   06/18/15 0529 06/18/15 1028  BP: 136/58 158/62  Pulse: 68   Temp: 97.6 F (36.4 C)   Resp: 18     General: Comfortable at rest; patient has  remained afebrile and with steady improvement on her breathing. Denies CP and Endorses continue improvement in swelling overall.  Cardiovascular: S1-S2 normal. No murmurs. Pulse regular.  Respiratory: positive diffuse rhonchi and very mild bibasilar crackles; no frank wheezing and good air movement  Abdomen: Soft and nontender. Normal bowel sounds. No organomegaly.  Musculoskeletal: 1+ edema bilaterally affecting LE bilaterally and upper extremities; steadily improving/resolving  Neurological: Intact   Discharge Instructions   Discharge Instructions    Diet - low sodium heart healthy    Complete by:  As directed      Discharge instructions    Complete by:  As directed   Daily weight and low sodium diet (less than 2 gram daily) Adequate hydration  Rehabilitation and conditioning as per Physical therapy SNF protocol  Take medications as prescribed     Increase activity slowly    Complete by:  As directed           Current Discharge Medication List    START taking these medications   Details  acetaminophen (TYLENOL) 325 MG tablet Take 2 tablets (650 mg total) by mouth every 6 (six) hours as needed for mild pain, fever or headache.    benzonatate (TESSALON) 200 MG capsule Take 1 capsule (200 mg total) by mouth 3 (three) times daily as needed for cough.    budesonide (PULMICORT) 0.5 MG/2ML nebulizer solution Take 2 mLs (0.5 mg total) by nebulization 2 (two) times daily.    guaiFENesin (MUCINEX) 600 MG 12 hr tablet Take 1 tablet (600 mg total) by mouth 2 (two) times daily.    pantoprazole (PROTONIX) 40 MG tablet Take 1 tablet (40 mg total) by mouth daily.    predniSONE (DELTASONE) 10 MG tablet Take 1 tablet (10 mg total) by mouth daily with breakfast.      CONTINUE these medications which have CHANGED   Details  furosemide (LASIX) 40 MG tablet Take 1.5 tablets (60 mg total) by mouth daily.      CONTINUE these medications which have NOT CHANGED   Details  alendronate  (FOSAMAX) 70 MG tablet Take 70 mg by mouth once a week. Take with a full glass of water on an empty stomach.    atorvastatin (LIPITOR) 10 MG tablet Take 10 mg by mouth daily.    B Complex-C (B-COMPLEX WITH VITAMIN C) tablet Take 1 tablet by mouth daily.    brimonidine (ALPHAGAN) 0.15 % ophthalmic solution Place 1 drop into both eyes 3 (three) times daily.    Cholecalciferol (VITAMIN D PO) Take by mouth.    diltiazem (CARDIZEM CD) 360 MG 24 hr capsule Take 1 capsule (360 mg total) by mouth daily. Qty: 90 capsule, Refills: 0    labetalol (NORMODYNE) 200 MG tablet Take 1 tablet (200 mg total) by mouth 2 (two) times daily. Qty: 60 tablet, Refills: 5    latanoprost (XALATAN) 0.005 % ophthalmic solution Place 1 drop into both eyes at bedtime. Refills: 3    levothyroxine (SYNTHROID, LEVOTHROID) 100 MCG tablet Take 100 mcg by mouth daily.    Polyethyl Glycol-Propyl Glycol (SYSTANE OP)  Apply 2 drops to eye daily as needed (dry eyes).     potassium chloride SA (K-DUR,KLOR-CON) 20 MEQ tablet Take 20 mEq by mouth daily.    timolol (TIMOPTIC) 0.5 % ophthalmic solution Place 1 drop into both eyes every morning.  Refills: 3    warfarin (COUMADIN) 1 MG tablet Take as directed by coumadin clinic Qty: 270 tablet, Refills: 0      STOP taking these medications     levofloxacin (LEVAQUIN) 500 MG tablet        Allergies  Allergen Reactions  . Penicillins Hives and Swelling       . Sulfa Antibiotics Anaphylaxis and Swelling   Follow-up Information    Follow up with ROBERTS, Vernie Ammons, Farley. Schedule an appointment as soon as possible for a visit in 10 days.   Specialty:  Internal Medicine   Contact information:   [address] [place] [address] [phone]       The results of significant diagnostics from this hospitalization (including imaging, microbiology, ancillary and laboratory) are listed below for reference.    Significant Diagnostic Studies: Dg Chest 1  View  06/13/2015  CLINICAL DATA:  Status post thoracentesis EXAM: CHEST 1 VIEW COMPARISON:  June 11, 2015 chest radiograph and chest CT June 12, 2015 FINDINGS: There is no appreciable pneumothorax post right-sided thoracentesis. There is minimal residual pleural effusion on the right. There is moderate pleural effusion on the left with patchy airspace consolidation throughout much of the left lung. Heart is enlarged with pulmonary vascularity within normal limits. There is atherosclerotic calcification in the aorta. No adenopathy is apparent. Bones are osteoporotic. IMPRESSION: No pneumothorax. Near complete resolution of right pleural effusion. Moderate pleural effusion on the left with airspace consolidation throughout much of the left lung. Stable cardiomegaly. Bones osteoporotic. Electronically Signed   By: Bretta Bang III M.D.   On: 06/13/2015 11:45   Dg Chest 2 View  06/17/2015  CLINICAL DATA:  82 year old female with a history of shortness of breath EXAM: CHEST - 2 VIEW COMPARISON:  06/13/2015, CT 06/12/2015 FINDINGS: Cardiomediastinal silhouette unchanged. Improving aeration of the left chest, with persistent opacity in the retrocardiac region obscuring the left hemidiaphragm and partial left heart border. No pneumothorax. Mixed interstitial and airspace opacities on the right, similar to prior. Low lung volumes. Atherosclerosis. IMPRESSION: Improving left-sided aeration, with persisting opacity at the left base, suggesting residual pneumonia, edema, and/ or consolidation. Small left pleural effusion persists. Signed, Yvone Neu. Loreta Ave, DO [facility] Electronically Signed   By: Gilmer Mor D.O.   On: 06/17/2015 10:23   Dg Chest 2 View  06/02/2015  CLINICAL DATA:  Chest congestion and shortness of Breath EXAM: CHEST  2 VIEW COMPARISON:  05/30/2015 FINDINGS: Cardiac shadow is stable. Persistent left basilar infiltrate is noted. Some  increasing right basilar infiltrate is seen as well. Bilateral pleural effusions are noted. Changes of prior vertebral augmentation are seen. IMPRESSION: Bibasilar infiltrates left greater than right with associated effusions. Electronically Signed   By: Alcide Clever M.D.   On: 06/02/2015 08:57   Dg Chest 2 View  05/30/2015  CLINICAL DATA:  Pt here from home, she was seen at PCP and had a chest xray that showed pneumonia and she was told to come to ED. EXAM: CHEST  2 VIEW COMPARISON:  05/29/2015 FINDINGS: Lung base opacity is noted, less prominent than on the previous day's study. On the lateral view this appears to be streaky peribronchovascular  opacity, seen to a lesser degree in the perihilar regions. This is consistent with bronchial inflammation. May be all chronic bronchitic change. A component acute bronchitis is possible. There is no convincing pneumonia. No pulmonary edema. No pleural effusion or pneumothorax. Cardiac silhouette is mildly enlarged. No mediastinal or hilar masses or convincing adenopathy. Bony thorax is demineralized. Vertebroplasty has been performed in upper lumbar vertebrae, stable from the prior study. IMPRESSION: 1. No convincing pneumonia. 2. Findings described above may be due to chronic bronchitic change. Superimposed acute bronchitis is possible. Electronically Signed   By: Amie Portland M.D.   On: 05/30/2015 19:22   Dg Chest 2 View  05/29/2015  CLINICAL DATA:  82 year old presenting with acute onset of cough and nausea. EXAM: CHEST  2 VIEW COMPARISON:  01/10/2015 and earlier. FINDINGS: Suboptimal inspiration accounts for crowded bronchovascular markings, especially in the bases, and accentuates the cardiac silhouette. Taking this into account, cardiac silhouette moderately enlarged, unchanged. Thoracic aorta tortuous atherosclerotic, unchanged. Hilar and mediastinal contours otherwise unremarkable. Bronchiectasis involving the lower lobes, right greater than left. Marked  central peribronchial thickening, more so than on the prior examination. Airspace consolidation in both lower lobes, right greater than left. Possible small bilateral pleural effusions. Pulmonary vascularity normal. Exaggeration of the usual thoracic kyphosis in part related to old compression fractures of L1 with augmentation and of T12. IMPRESSION: Acute bilateral lower lobe pneumonia superimposed upon moderate to severe changes of acute bronchitis and/or asthma. Electronically Signed   By: Hulan Saas M.D.   On: 05/29/2015 16:51   Ct Chest Wo Contrast  06/12/2015  CLINICAL DATA:  Cough and nausea for 4 days EXAM: CT CHEST WITHOUT CONTRAST TECHNIQUE: Multidetector CT imaging of the chest was performed following the standard protocol without IV contrast. COMPARISON:  06/07/2015 FINDINGS: Lungs are well aerated bilaterally with bilateral pleural effusions of a moderate degree. These have increased in the interval from the prior exam. Bilateral lower lobe consolidation is again identified. There are new patchy infiltrative changes identified throughout both lungs. Some of these have a nodular component but given their acute nature are consistent with infiltrate. The thoracic inlet is within normal limits. Diffuse aortic calcifications are again identified. No significant hilar or mediastinal adenopathy is identified. There remains some dependent debris within the left bronchial tree likely related to some mucous. This is stable from the previous exam. The visualized upper abdomen and bony structures are stable. IMPRESSION: Increasing bilateral pleural effusions as well as increasing bilateral infiltrates. Continued follow-up is recommended. Electronically Signed   By: Alcide Clever M.D.   On: 06/12/2015 17:00   Ct Chest Wo Contrast  06/07/2015  CLINICAL DATA:  Weakness with four-day history of cough, nausea and decreased PO intake. Admitted for pneumonia and found to have influenza type A. EXAM: CT CHEST  WITHOUT CONTRAST TECHNIQUE: Multidetector CT imaging of the chest was performed following the standard protocol without IV contrast. COMPARISON:  Chest x-ray 06/05/2015 and 05/29/2015 FINDINGS: Lungs are adequately inflated and demonstrate small bilateral pleural effusions right greater than left with associated atelectasis. There is mild patchy peripheral opacification and reticulonodular opacification over the upper lobes bilaterally right worse than left which may be due to a pneumonia versus atypical infection or inflammatory process. Suggestion of dependent debris within the left lower lobe bronchus possibly from aspiration. Small focal density along the left lateral wall of the trachea possibly aspirated material. There is mild cardiomegaly. There is minimal calcification over the 8 mitral valve annulus. There is mild calcified  plaque over the left main, left anterior descending and right coronary arteries. Mild calcified plaque over the thoracic aorta. 1 cm precarinal lymph node likely reactive. No significant hilar or axillary adenopathy. Images through the upper abdomen demonstrate minimal prominence of the left intrarenal collecting system versus parapelvic cysts. There are moderate degenerative changes of the spine. Evidence of previous kyphoplasty of a severe L1 compression fracture. The L1 compression fracture causes compression of the anterior aspect of T12 unchanged. IMPRESSION: Patchy hazy peripheral opacification and reticulonodular opacification over the upper lobes which may be due to a pneumonia versus atypical infection or inflammatory process. Small bilateral pleural effusions with bibasilar atelectasis. Dependent debris within the left lower lobe bronchus which may be due to aspirated material and raises possibility of aspiration pneumonia. Recommend followup CT 3-4 weeks. Mild cardiomegaly.  Atherosclerotic coronary artery disease. Stable compression fracture post kyphoplasty of L1 with  adjacent anterior compression deformity of T12 unchanged. Possible mild dilatation of the left intrarenal collecting system versus parapelvic renal cyst. Electronically Signed   By: Elberta Fortis M.D.   On: 06/07/2015 17:21   Dg Chest Port 1 View  06/11/2015  CLINICAL DATA:  Cough EXAM: PORTABLE CHEST 1 VIEW COMPARISON:  06/07/2015 FINDINGS: Cardiac shadow is enlarged but stable. Bibasilar changes are noted left slightly greater than right similar to that seen on the prior CT examination. No new focal abnormality is noted. IMPRESSION: Stable bibasilar changes from 06/07/2015 Electronically Signed   By: Alcide Clever M.D.   On: 06/11/2015 15:27   Dg Chest Port 1 View  06/05/2015  CLINICAL DATA:  Cough for 2 weeks EXAM: PORTABLE CHEST 1 VIEW COMPARISON:  06/02/2015 FINDINGS: Cardiac shadow is mildly enlarged but stable. Bibasilar infiltrates are again seen and relatively stable. No acute bony abnormality is noted. No new focal infiltrate is seen. IMPRESSION: No significant change from the previous exam. Electronically Signed   By: Alcide Clever M.D.   On: 06/05/2015 12:41   US Thoracentesis Asp Pleural Space W/img Guide  06/13/2015  INDICATION: Two weeks ago was diagnosed with influenza. Readmitted for pneumonia. Patient noted to have a right-sided pleural effusion and a request has been made for a diagnostic and therapeutic thoracentesis. EXAM: ULTRASOUND GUIDED DIAGNOSTIC AND THERAPEUTIC THORACENTESIS MEDICATIONS: 1% lidocaine COMPLICATIONS: None immediate. PROCEDURE: An ultrasound guided thoracentesis was thoroughly discussed with the patient and questions answered. The benefits, risks, alternatives and complications were also discussed. The patient understands and wishes to proceed with the procedure. Written consent was obtained. Ultrasound was performed to localize and mark an adequate pocket of fluid in the right chest. The area was then prepped and draped in the normal sterile fashion. 1% Lidocaine was  used for local anesthesia. A Safe-T-Centesis catheter was introduced. Thoracentesis was performed. The catheter was removed and a dressing applied. FINDINGS: A total of approximately 0.36 L of yellow fluid was removed. Samples were sent to the laboratory as requested by the clinical team. IMPRESSION: Successful ultrasound guided right thoracentesis yielding 0.36 L of pleural fluid. Read by: Barnetta Chapel, PA-C Electronically Signed   By: Malachy Moan M.D.   On: 06/13/2015 11:45    Microbiology: Recent Results (from the past 240 hour(s))  Culture, Urine     Status: None   Collection Time: 06/09/15 10:14 AM  Result Value Ref Range Status   Specimen Description URINE, CLEAN CATCH  Final   Special Requests NONE  Final   Culture   Final    MULTIPLE SPECIES PRESENT, SUGGEST RECOLLECTION Performed  at [facility]    Report Status 06/10/2015 FINAL  Final  Culture, body fluid-bottle     Status: None (Preliminary result)   Collection Time: 06/13/15 11:05 AM  Result Value Ref Range Status   Specimen Description FLUID RIGHT PLEURAL  Final   Special Requests NONE  Final   Culture   Final    NO GROWTH 4 DAYS Performed at [facility]    Report Status PENDING  Incomplete  Gram stain     Status: None   Collection Time: 06/13/15 11:05 AM  Result Value Ref Range Status   Specimen Description FLUID RIGHT PLEURAL  Final   Special Requests NONE Performed at [facility]   Final   Gram Stain   Final    FEW WBC PRESENT,BOTH PMN AND MONONUCLEAR NO ORGANISMS SEEN    Report Status 06/13/2015 FINAL  Final     Labs: Basic Metabolic Panel:  Recent Labs Lab 06/13/15 0544 06/13/15 1300 06/14/15 0518 06/15/15 0512 06/16/15 0512 06/17/15 0514  NA 136  --  138 139 140 143  K 4.4  --  4.3 4.5 4.1 4.1  CL 96*  --  98* 99* 99* 99*  CO2 30  --  32 31 32 31  GLUCOSE 154*  --  100* 102* 119* 125*  BUN 38*  --  40* 43* 44* 44*  CREATININE 1.01*  --  1.04* 1.07* 1.15* 1.13*   CALCIUM 8.9  --  9.1 8.8* 9.1 9.3  MG  --  2.1  --   --   --   --   PHOS  --  2.4*  --   --   --   --    Liver Function Tests:  Recent Labs Lab 06/12/15 0520 06/13/15 0544 06/14/15 0518  AST 11* 13* 14*  ALT 12* 12* 11*  ALKPHOS 51 49 55  BILITOT 0.7 0.8 1.0  PROT 6.1* 5.9* 5.8*  ALBUMIN 2.8* 2.6* 2.5*   CBC:  Recent Labs Lab 06/12/15 0520 06/13/15 0544 06/14/15 0518  WBC 19.1* 19.8* 19.2*  NEUTROABS 16.9* 17.4* 16.7*  HGB 9.9* 10.0* 10.8*  HCT 31.2* 31.8* 32.5*  MCV 86.9 86.6 82.7  PLT 240 245 272   BNP (last 3 results)  Recent Labs  05/30/15 1852 06/03/15 1611 06/14/15 0813  BNP 212.3* 358.1* 365.3*    Signed:  Vassie Loll Farley.  [facility] 06/18/2015, 12:15 PM

## 2015-06-18 NOTE — Care Management Note (Addendum)
Case Management Note  Patient Details  Name: Driscilla MoatsMamie N Vitullo MRN: [id number] Date of Birth: 08/10/1919  Subjective/Objective:     CAP               Action/Plan: Discharge Planning: AVS reviewed: Chart reviewed. Pt scheduled for dc to SNF. CSW following for placement. No NCM needs identified.   Expected Discharge Date:  06/19/2015               Expected Discharge Plan:  Skilled Nursing Facility  In-House Referral:  Clinical Social Work  Discharge planning Services  CM Consult  Post Acute Care Choice:  NA Choice offered to:  NA  DME Arranged:  N/A DME Agency:  NA  HH Arranged:  NA HH Agency:  NA  Status of Service:  Completed, signed off  Medicare Important Message Given:  Yes Date Medicare IM Given:    Medicare IM give by:    Date Additional Medicare IM Given:    Additional Medicare Important Message give by:     If discussed at Long Length of Stay Meetings, dates discussed:    Additional Comments:  Elliot CousinShavis, Fiana Gladu Ellen, RN 06/18/2015, 1:38 PM

## 2015-06-18 NOTE — Clinical Social Work Note (Addendum)
CSW spoke with Herbert SetaHeather, admission coordinator for [facility] regarding discharging the patient. Herbert SetaHeather states that she informed weekend CSW that [facility] due to no beds being available. Herbert SetaHeather states that the facility is prepared to accept the patient on 3/27, but will not take patient today. CSW updated niece Wille CelesteJanie regarding issues with [facility]. Roddie McBryant Marcine Gadway MSW, [place]CS[place], [place], [phone]

## 2015-06-18 NOTE — Progress Notes (Signed)
ANTICOAGULATION CONSULT NOTE   Pharmacy Consult for warfarin Indication: atrial fibrillation  Allergies  Allergen Reactions  . Penicillins Hives and Swelling       . Sulfa Antibiotics Anaphylaxis and Swelling    Patient Measurements: Height: 5\' 4"  (162.6 cm) Weight: 156 lb 3.2 oz (70.852 kg) IBW/kg (Calculated) : 54.7  Vital Signs: Temp: 97.6 F (36.4 C) (03/26 0529) Temp Source: Oral (03/26 0529) BP: 136/58 mmHg (03/26 0529) Pulse Rate: 68 (03/26 0529)  Labs:  Recent Labs  06/16/15 0512 06/17/15 0514 06/18/15 0447  LABPROT 19.3* 20.3* 18.5*  INR 1.70* 1.81* 1.60*  CREATININE 1.15* 1.13*  --     Estimated Creatinine Clearance: 30.1 mL/min (by C-G formula based on Cr of 1.13).  Assessment: [age] yoF on warfarin PTA for Afib who was admitted on 3/7 for pneumonia.  PTA Warfarin regimen reported as 3mg  daily except 2mg  MWF; INR 1.85 subtherapeutic on admission.  Pharmacy consulted to continue dosing of warfarin.  Significant events: 3/20: chest CT- increasing bilateral effusions.  Warfarin held and Vit K 5mg  IV x1 dose prior to thoracentesis.   3/21: Vit K 5 mg IV x1 given at 0930.  Thoracentesis performed w/o complications. 3/22 Warfarin dosing resumed.  Today, 06/18/2015:  INR down from 1.81 to 1.60 today with all doses charted in [facility]  CBC: Hgb low but stable, Plt WNL (last on 3/22)  No bleeding documented  Diet: heart healthy, ~75- 80% meal intake charted.  Drug-drug interactions: Levaquin(from 3/6-3/14), Flagyl (3/20-3/21).  No significant drug-drug intxns noted at this time  Goal of Therapy:  INR 2-3   Plan:  - Increase Warfarin to 4 mg PO today at 1800  - Daily PT/INR. - Monitor for signs and symptoms of bleeding.  Dorna LeitzAnh Qiana Landgrebe, PharmD, BCPS 06/18/2015 9:07 AM

## 2015-06-19 LAB — PROTIME-INR
INR: 1.8 — ABNORMAL HIGH (ref 0.00–1.49)
Prothrombin Time: 20.2 seconds — ABNORMAL HIGH (ref 11.6–15.2)

## 2015-06-19 NOTE — Progress Notes (Signed)
Patient seen and examined. Has remained hemodynamically stable and appropriate for discharge to skilled nursing facility. Patient's discharge summary has been reviewed and no changes are required. Please refer to discharge summary and medication reconciliation list as prepare on 06/18/2015.  Vassie LollMadera, Omari Mcmanaway [phone]

## 2015-06-19 NOTE — Progress Notes (Signed)
Nutrition Follow-up  DOCUMENTATION CODES:   Not applicable  INTERVENTION:  - Continue Regular diet - RD will continue to monitor for needs if pt unable to d/c today  NUTRITION DIAGNOSIS:   Inadequate oral intake related to acute illness, nausea, vomiting as evidenced by per patient/family report -improved  GOAL:   Patient will meet greater than or equal to 90% of their needs -met on average  MONITOR:   PO intake, Skin, Weight trends, Labs, I & O's  ASSESSMENT:   82 y.o. female who presents to the ED with c/o weakness. She has had 4 day history of cough, nausea, decreased PO intake, generalized weakness. All of this onset late last week as a URI which then moved down into her chest. She saw her PCP yesterday who did a CXR (which is available on our Epic system) which demonstrated BLL PNA), labs were drawn this morning which showed dehydration with K of 2.5, elevated BUN. Patient was sent to the ED. Daughter gave patient Gatorade in the mean time.  Per review, d/c order and summary written 3/26. Per chart review, pt ate 80% of breakfast 3/25 and 75% of breakfast and 60% of lunch yesterday (3/26). Pt meeting needs on average at this time. Will continue to monitor for needs if pt unable to d/c today. Medications reviewed. Labs reviewed; Cl: 99 mmol/L, BUN/creatinine elevated, GFR: 41.  Diet Order:  Diet regular Room service appropriate?: Yes; Fluid consistency:: Thin Diet - low sodium heart healthy  Skin:  Reviewed, no issues  Last BM:  3/25  Height:   Ht Readings from Last 1 Encounters:  05/30/15 '5\' 4"'  (1.626 m)    Weight:   Wt Readings from Last 1 Encounters:  06/19/15 156 lb 12.8 oz (71.124 kg)    Ideal Body Weight:  54.54 kg (kg)  BMI:  Body mass index is 26.9 kg/(m^2).  Estimated Nutritional Needs:   Kcal:  1350-1550  Protein:  55-65 grams  Fluid:  1.8-2 L/day  EDUCATION NEEDS:   No education needs identified at this time     Jarome Matin, RD,  LDN Inpatient Clinical Dietitian Pager # [phone] After hours/weekend pager # [phone]

## 2015-06-19 NOTE — Care Management Note (Signed)
Case Management Note  Patient Details  Name: Tonya Farley MRN: [id number] Date of Birth: 07/25/1919  Subjective/Objective:                    Action/Plan:d/c SNF.   Expected Discharge Date:                  Expected Discharge Plan:  Skilled Nursing Facility  In-House Referral:  Clinical Social Work  Discharge planning Services  CM Consult  Post Acute Care Choice:  NA Choice offered to:  NA  DME Arranged:  N/A DME Agency:  NA  HH Arranged:  NA HH Agency:  NA  Status of Service:  Completed, signed off  Medicare Important Message Given:  Yes Date Medicare IM Given:    Medicare IM give by:    Date Additional Medicare IM Given:    Additional Medicare Important Message give by:     If discussed at Long Length of Stay Meetings, dates discussed:    Additional Comments:  Tonya Farley, Tonya Sawtelle, RN 06/19/2015, 9:51 AM

## 2015-06-19 NOTE — Clinical Social Work Placement (Signed)
Patient is set to discharge to [facility] today. Patient & daughter, Darel HongJudy aware. [facility] called for [facility].     Lincoln MaxinKelly Gerrell Tabet, LCSW [facility] Clinical Social Worker cell #: [phone]    CLINICAL SOCIAL WORK PLACEMENT  NOTE  Date:  06/19/2015  Patient Details  Name: Tonya MoatsMamie N Mcaulay MRN: [id number] Date of Birth: 09/12/1919  Clinical Social Work is seeking post-discharge placement for this patient at the Skilled  Nursing Facility level of care (*CSW will initial, date and re-position this form in  chart as items are completed):  Yes   Patient/family provided with [place] Clinical Social Work Department's list of facilities offering this level of care within the geographic area requested by the patient (or if unable, by the patient's family).  Yes   Patient/family informed of their freedom to choose among providers that offer the needed level of care, that participate in Medicare, Medicaid or managed care program needed by the patient, have an available bed and are willing to accept the patient.  Yes   Patient/family informed of [facility]'s ownership interest in [facility] and [facility], as well as of the fact that they are under no obligation to receive care at these facilities.  PASRR submitted to EDS on 06/01/15     PASRR number received on 06/01/15     Existing PASRR number confirmed on 06/01/15     FL2 transmitted to all facilities in geographic area requested by pt/family on 06/01/15     FL2 transmitted to all facilities within larger geographic area on       Patient informed that his/her managed care company has contracts with or will negotiate with certain facilities, including the following:        Yes   Patient/family informed of bed offers received.  Patient chooses bed at [facility]     Physician recommends and patient chooses bed at      Patient to be  transferred to [facility] on 06/19/15.  Patient to be transferred to facility by [facility]     Patient family notified on 06/19/15 of transfer.  Name of family member notified:  patient's daughter, Darel HongJudy via phone     PHYSICIAN       Additional Comment:    _______________________________________________ Arlyss RepressHarrison, Valon Glasscock F, LCSW 06/19/2015, 10:45 AM

## 2015-06-23 ENCOUNTER — Telehealth: Payer: Self-pay | Admitting: *Deleted

## 2015-06-23 NOTE — Telephone Encounter (Signed)
Pt was in Hospital from 05/30/2015 to 06/19/2015 with Community Acquired Pneumonia and then [facility] . Sp[facility] and she states they check her INR and dose her coumadin while she is a pt there. Asked Angelica ChessmanMandy to call us and let us know when she is discharged  so that we can resume following her and monitoring her INR and Coumadin dosing

## 2015-07-20 ENCOUNTER — Telehealth: Payer: Self-pay | Admitting: Interventional Cardiology

## 2015-07-20 NOTE — Telephone Encounter (Signed)
[name] with [facility] called to inform us that the pt was d/c from [facility] & that the are following the pt.  Orders were given for [facility] to check an INR and call to [facility].

## 2015-07-20 NOTE — Telephone Encounter (Signed)
New message   [name] Is calling to request that Dr.Varanasi have an order for rn to do INR at pt home   She said she will send results into office so pt wont have [facility]

## 2015-07-20 NOTE — Telephone Encounter (Signed)
I will forward to CVRR 

## 2015-07-25 ENCOUNTER — Other Ambulatory Visit: Payer: Self-pay | Admitting: Interventional Cardiology

## 2015-07-25 ENCOUNTER — Ambulatory Visit (INDEPENDENT_AMBULATORY_CARE_PROVIDER_SITE_OTHER): Payer: Medicare Other

## 2015-07-25 DIAGNOSIS — Z5181 Encounter for therapeutic drug level monitoring: Secondary | ICD-10-CM

## 2015-07-25 DIAGNOSIS — I4891 Unspecified atrial fibrillation: Secondary | ICD-10-CM

## 2015-07-25 DIAGNOSIS — I482 Chronic atrial fibrillation, unspecified: Secondary | ICD-10-CM

## 2015-07-25 LAB — POCT INR: INR: 1.3

## 2015-07-26 ENCOUNTER — Other Ambulatory Visit: Payer: Self-pay | Admitting: Interventional Cardiology

## 2015-08-02 ENCOUNTER — Ambulatory Visit (INDEPENDENT_AMBULATORY_CARE_PROVIDER_SITE_OTHER): Payer: Medicare Other | Admitting: *Deleted

## 2015-08-02 DIAGNOSIS — I482 Chronic atrial fibrillation, unspecified: Secondary | ICD-10-CM

## 2015-08-02 DIAGNOSIS — Z5181 Encounter for therapeutic drug level monitoring: Secondary | ICD-10-CM | POA: Diagnosis not present

## 2015-08-02 DIAGNOSIS — I4891 Unspecified atrial fibrillation: Secondary | ICD-10-CM | POA: Diagnosis not present

## 2015-08-02 LAB — POCT INR: INR: 1.6

## 2015-08-10 ENCOUNTER — Encounter (HOSPITAL_COMMUNITY): Payer: Self-pay

## 2015-08-10 ENCOUNTER — Inpatient Hospital Stay (HOSPITAL_COMMUNITY)
Admission: EM | Admit: 2015-08-10 | Discharge: 2015-08-13 | DRG: 291 | Disposition: A | Discharge status: Home Health | Payer: Medicare Other | Attending: Internal Medicine | Admitting: Internal Medicine

## 2015-08-10 ENCOUNTER — Emergency Department (HOSPITAL_COMMUNITY): Payer: Medicare Other

## 2015-08-10 DIAGNOSIS — I5033 Acute on chronic diastolic (congestive) heart failure: Secondary | ICD-10-CM | POA: Diagnosis not present

## 2015-08-10 DIAGNOSIS — R0600 Dyspnea, unspecified: Secondary | ICD-10-CM | POA: Diagnosis present

## 2015-08-10 DIAGNOSIS — R5381 Other malaise: Secondary | ICD-10-CM

## 2015-08-10 DIAGNOSIS — Z5181 Encounter for therapeutic drug level monitoring: Secondary | ICD-10-CM | POA: Diagnosis not present

## 2015-08-10 DIAGNOSIS — J189 Pneumonia, unspecified organism: Secondary | ICD-10-CM

## 2015-08-10 DIAGNOSIS — I130 Hypertensive heart and chronic kidney disease with heart failure and stage 1 through stage 4 chronic kidney disease, or unspecified chronic kidney disease: Principal | ICD-10-CM | POA: Diagnosis present

## 2015-08-10 DIAGNOSIS — R627 Adult failure to thrive: Secondary | ICD-10-CM | POA: Diagnosis present

## 2015-08-10 DIAGNOSIS — Z880 Allergy status to penicillin: Secondary | ICD-10-CM

## 2015-08-10 DIAGNOSIS — Z882 Allergy status to sulfonamides status: Secondary | ICD-10-CM

## 2015-08-10 DIAGNOSIS — E785 Hyperlipidemia, unspecified: Secondary | ICD-10-CM | POA: Diagnosis present

## 2015-08-10 DIAGNOSIS — J918 Pleural effusion in other conditions classified elsewhere: Secondary | ICD-10-CM | POA: Diagnosis present

## 2015-08-10 DIAGNOSIS — Z66 Do not resuscitate: Secondary | ICD-10-CM | POA: Diagnosis present

## 2015-08-10 DIAGNOSIS — G8929 Other chronic pain: Secondary | ICD-10-CM | POA: Diagnosis present

## 2015-08-10 DIAGNOSIS — Z7952 Long term (current) use of systemic steroids: Secondary | ICD-10-CM

## 2015-08-10 DIAGNOSIS — I272 Other secondary pulmonary hypertension: Secondary | ICD-10-CM | POA: Diagnosis present

## 2015-08-10 DIAGNOSIS — I701 Atherosclerosis of renal artery: Secondary | ICD-10-CM | POA: Diagnosis present

## 2015-08-10 DIAGNOSIS — R601 Generalized edema: Secondary | ICD-10-CM | POA: Diagnosis present

## 2015-08-10 DIAGNOSIS — I350 Nonrheumatic aortic (valve) stenosis: Secondary | ICD-10-CM | POA: Diagnosis present

## 2015-08-10 DIAGNOSIS — I482 Chronic atrial fibrillation, unspecified: Secondary | ICD-10-CM

## 2015-08-10 DIAGNOSIS — Z7901 Long term (current) use of anticoagulants: Secondary | ICD-10-CM

## 2015-08-10 DIAGNOSIS — N183 Chronic kidney disease, stage 3 (moderate): Secondary | ICD-10-CM | POA: Diagnosis present

## 2015-08-10 DIAGNOSIS — E876 Hypokalemia: Secondary | ICD-10-CM | POA: Diagnosis not present

## 2015-08-10 DIAGNOSIS — I4891 Unspecified atrial fibrillation: Secondary | ICD-10-CM | POA: Diagnosis present

## 2015-08-10 DIAGNOSIS — J90 Pleural effusion, not elsewhere classified: Secondary | ICD-10-CM

## 2015-08-10 DIAGNOSIS — Z7951 Long term (current) use of inhaled steroids: Secondary | ICD-10-CM

## 2015-08-10 DIAGNOSIS — E039 Hypothyroidism, unspecified: Secondary | ICD-10-CM | POA: Diagnosis present

## 2015-08-10 DIAGNOSIS — Z79899 Other long term (current) drug therapy: Secondary | ICD-10-CM

## 2015-08-10 DIAGNOSIS — R609 Edema, unspecified: Secondary | ICD-10-CM | POA: Diagnosis not present

## 2015-08-10 LAB — CBC WITH DIFFERENTIAL/PLATELET
BASOS PCT: 0 %
Basophils Absolute: 0 10*3/uL (ref 0.0–0.1)
EOS ABS: 0.2 10*3/uL (ref 0.0–0.7)
EOS PCT: 2 %
HEMATOCRIT: 35.8 % — AB (ref 36.0–46.0)
HEMOGLOBIN: 11.2 g/dL — AB (ref 12.0–15.0)
Lymphocytes Relative: 16 %
Lymphs Abs: 1.4 10*3/uL (ref 0.7–4.0)
MCH: 27.3 pg (ref 26.0–34.0)
MCHC: 31.3 g/dL (ref 30.0–36.0)
MCV: 87.3 fL (ref 78.0–100.0)
MONOS PCT: 8 %
Monocytes Absolute: 0.7 10*3/uL (ref 0.1–1.0)
NEUTROS PCT: 74 %
Neutro Abs: 6.1 10*3/uL (ref 1.7–7.7)
Platelets: 281 10*3/uL (ref 150–400)
RBC: 4.1 MIL/uL (ref 3.87–5.11)
RDW: 18.2 % — ABNORMAL HIGH (ref 11.5–15.5)
WBC: 8.3 10*3/uL (ref 4.0–10.5)

## 2015-08-10 LAB — COMPREHENSIVE METABOLIC PANEL
ALK PHOS: 68 U/L (ref 38–126)
ALT: 18 U/L (ref 14–54)
ANION GAP: 8 (ref 5–15)
AST: 17 U/L (ref 15–41)
Albumin: 3.6 g/dL (ref 3.5–5.0)
BILIRUBIN TOTAL: 0.9 mg/dL (ref 0.3–1.2)
BUN: 20 mg/dL (ref 6–20)
CALCIUM: 9.3 mg/dL (ref 8.9–10.3)
CO2: 27 mmol/L (ref 22–32)
Chloride: 108 mmol/L (ref 101–111)
Creatinine, Ser: 1.32 mg/dL — ABNORMAL HIGH (ref 0.44–1.00)
GFR, EST AFRICAN AMERICAN: 39 mL/min — AB (ref >60)
GFR, EST NON AFRICAN AMERICAN: 34 mL/min — AB (ref >60)
Glucose, Bld: 100 mg/dL — ABNORMAL HIGH (ref 65–99)
Potassium: 4 mmol/L (ref 3.5–5.1)
SODIUM: 143 mmol/L (ref 135–145)
TOTAL PROTEIN: 6.8 g/dL (ref 6.5–8.1)

## 2015-08-10 LAB — MRSA PCR SCREENING: MRSA by PCR: NEGATIVE

## 2015-08-10 LAB — BRAIN NATRIURETIC PEPTIDE: B NATRIURETIC PEPTIDE 5: 533.1 pg/mL — AB (ref 0.0–100.0)

## 2015-08-10 LAB — PROTIME-INR
INR: 2.07 — ABNORMAL HIGH (ref 0.00–1.49)
Prothrombin Time: 22.4 seconds — ABNORMAL HIGH (ref 11.6–15.2)

## 2015-08-10 LAB — I-STAT TROPONIN, ED: TROPONIN I, POC: 0.01 ng/mL (ref 0.00–0.08)

## 2015-08-10 MED ORDER — VANCOMYCIN HCL IN DEXTROSE 1-5 GM/200ML-% IV SOLN
1000.0000 mg | Freq: Once | INTRAVENOUS | Status: AC
  Administered 2015-08-10: 1000 mg via INTRAVENOUS
  Filled 2015-08-10: qty 200

## 2015-08-10 MED ORDER — LEVOTHYROXINE SODIUM 100 MCG PO TABS
100.0000 ug | ORAL_TABLET | Freq: Every day | ORAL | Status: DC
  Administered 2015-08-11 – 2015-08-13 (×3): 100 ug via ORAL
  Filled 2015-08-10 (×3): qty 1

## 2015-08-10 MED ORDER — WARFARIN - PHARMACIST DOSING INPATIENT: Freq: Every day | Status: DC

## 2015-08-10 MED ORDER — DEXTROSE 5 % IV SOLN
2.0000 g | Freq: Once | INTRAVENOUS | Status: AC
  Administered 2015-08-10: 2 g via INTRAVENOUS
  Filled 2015-08-10 (×2): qty 2

## 2015-08-10 MED ORDER — ATORVASTATIN CALCIUM 10 MG PO TABS
10.0000 mg | ORAL_TABLET | Freq: Every day | ORAL | Status: DC
  Administered 2015-08-11 – 2015-08-13 (×3): 10 mg via ORAL
  Filled 2015-08-10 (×3): qty 1

## 2015-08-10 MED ORDER — B COMPLEX-C PO TABS
1.0000 | ORAL_TABLET | Freq: Every day | ORAL | Status: DC
  Administered 2015-08-11 – 2015-08-13 (×3): 1 via ORAL
  Filled 2015-08-10 (×3): qty 1

## 2015-08-10 MED ORDER — BUDESONIDE 0.5 MG/2ML IN SUSP
0.5000 mg | Freq: Two times a day (BID) | RESPIRATORY_TRACT | Status: DC
  Administered 2015-08-10 – 2015-08-13 (×5): 0.5 mg via RESPIRATORY_TRACT
  Filled 2015-08-10 (×5): qty 2

## 2015-08-10 MED ORDER — PANTOPRAZOLE SODIUM 40 MG PO TBEC
40.0000 mg | DELAYED_RELEASE_TABLET | Freq: Every day | ORAL | Status: DC
  Administered 2015-08-11 – 2015-08-13 (×3): 40 mg via ORAL
  Filled 2015-08-10 (×3): qty 1

## 2015-08-10 MED ORDER — LATANOPROST 0.005 % OP SOLN
1.0000 [drp] | Freq: Every day | OPHTHALMIC | Status: DC
  Administered 2015-08-10 – 2015-08-12 (×3): 1 [drp] via OPHTHALMIC
  Filled 2015-08-10: qty 2.5

## 2015-08-10 MED ORDER — DILTIAZEM HCL ER COATED BEADS 180 MG PO CP24
360.0000 mg | ORAL_CAPSULE | Freq: Every day | ORAL | Status: DC
  Administered 2015-08-11 – 2015-08-13 (×3): 360 mg via ORAL
  Filled 2015-08-10 (×3): qty 2

## 2015-08-10 MED ORDER — FUROSEMIDE 10 MG/ML IJ SOLN
40.0000 mg | Freq: Once | INTRAMUSCULAR | Status: AC
Start: 2015-08-10 — End: 2015-08-10
  Administered 2015-08-10: 40 mg via INTRAVENOUS
  Filled 2015-08-10: qty 4

## 2015-08-10 MED ORDER — TIMOLOL MALEATE 0.5 % OP SOLN
1.0000 [drp] | Freq: Every morning | OPHTHALMIC | Status: DC
  Administered 2015-08-11 – 2015-08-13 (×3): 1 [drp] via OPHTHALMIC
  Filled 2015-08-10: qty 5

## 2015-08-10 MED ORDER — BRIMONIDINE TARTRATE 0.15 % OP SOLN
1.0000 [drp] | Freq: Three times a day (TID) | OPHTHALMIC | Status: DC
  Administered 2015-08-10 – 2015-08-13 (×8): 1 [drp] via OPHTHALMIC
  Filled 2015-08-10: qty 5

## 2015-08-10 MED ORDER — POLYVINYL ALCOHOL 1.4 % OP SOLN
1.0000 [drp] | Freq: Every day | OPHTHALMIC | Status: DC | PRN
  Filled 2015-08-10: qty 15

## 2015-08-10 MED ORDER — WARFARIN SODIUM 2 MG PO TABS
2.0000 mg | ORAL_TABLET | Freq: Once | ORAL | Status: AC
  Administered 2015-08-10: 2 mg via ORAL
  Filled 2015-08-10: qty 1

## 2015-08-10 MED ORDER — FUROSEMIDE 10 MG/ML IJ SOLN
40.0000 mg | Freq: Two times a day (BID) | INTRAMUSCULAR | Status: DC
  Administered 2015-08-10 – 2015-08-13 (×6): 40 mg via INTRAVENOUS
  Filled 2015-08-10 (×6): qty 4

## 2015-08-10 MED ORDER — POTASSIUM CHLORIDE CRYS ER 20 MEQ PO TBCR
20.0000 meq | EXTENDED_RELEASE_TABLET | Freq: Every day | ORAL | Status: DC
  Administered 2015-08-11 – 2015-08-13 (×3): 20 meq via ORAL
  Filled 2015-08-10 (×3): qty 1

## 2015-08-10 MED ORDER — LABETALOL HCL 100 MG PO TABS
200.0000 mg | ORAL_TABLET | Freq: Two times a day (BID) | ORAL | Status: DC
  Administered 2015-08-10 – 2015-08-13 (×6): 200 mg via ORAL
  Filled 2015-08-10 (×6): qty 2

## 2015-08-10 NOTE — ED Provider Notes (Signed)
CSN: [id number]     Arrival date & time 08/10/15  7829 History   First MD Initiated Contact with Patient 08/10/15 1223     Chief Complaint  Patient presents with  . Hypertension  . Tachycardia     (Consider location/radiation/quality/duration/timing/severity/associated sxs/prior Treatment) HPI Tonya Farley is a 82 y.o. female with history of hypertension, chronic A. fib, on Coumadin, thyroid disease, history of pneumonia, presents to emergency department complaining of elevated heart rate and shortness of breath earlier today. Patient states she intermittently develops high heart rate. She has had to come to the ER for this in the past. Patient states that she took extra half of her heart medicine today which helped to lower the heart rate. Patient continues to have shortness of breath after the heart rate came down, but states she felt very anxious. At this time patient states she is symptom-free. Recent hospitalization, just 2 months ago for a pneumonia. Patient states that cough has resolved.  Past Medical History  Diagnosis Date  . Hypertension   . Hyperlipidemia   . Thyroid disease   . Hypothyroidism   . Chronic back pain   . PONV (postoperative nausea and vomiting)   . Chronic a-fib (HCC)   . A-fib (HCC)   . PNA (pneumonia)    Past Surgical History  Procedure Laterality Date  . Appendectomy    . Tonsillectomy    . Cataracts      bilateral  . Eye surgery      left eye "hole"   Family History  Problem Relation Age of Onset  . Heart disease Sister   . Heart disease Sister   . Cancer Mother   . Other Father   . Heart attack Father    Social History  Substance Use Topics  . Smoking status: Never Smoker   . Smokeless tobacco: Never Used  . Alcohol Use: No   OB History    No data available     Review of Systems  Constitutional: Negative for fever and chills.  Respiratory: Positive for shortness of breath. Negative for cough and chest tightness.    Cardiovascular: Positive for chest pain and palpitations. Negative for leg swelling.  Gastrointestinal: Negative for nausea, vomiting, abdominal pain and diarrhea.  Genitourinary: Negative for dysuria, flank pain and pelvic pain.  Musculoskeletal: Negative for myalgias, arthralgias, neck pain and neck stiffness.  Skin: Negative for rash.  Neurological: Negative for dizziness, weakness and headaches.  All other systems reviewed and are negative.     Allergies  Penicillins and Sulfa antibiotics  Home Medications   Prior to Admission medications   Medication Sig Start Date End Date Taking? Authorizing Provider  atorvastatin (LIPITOR) 10 MG tablet Take 10 mg by mouth daily.   Yes Historical Provider, MD  B Complex-C (B-COMPLEX WITH VITAMIN C) tablet Take 1 tablet by mouth daily.   Yes Historical Provider, MD  brimonidine (ALPHAGAN) 0.15 % ophthalmic solution Place 1 drop into both eyes 3 (three) times daily.   Yes Historical Provider, MD  budesonide (PULMICORT) 0.5 MG/2ML nebulizer solution Take 2 mLs (0.5 mg total) by nebulization 2 (two) times daily. 06/18/15  Yes Vassie Loll, MD  Cholecalciferol (VITAMIN D PO) Take 1 tablet by mouth daily.    Yes Historical Provider, MD  diltiazem (CARDIZEM CD) 360 MG 24 hr capsule Take 1 capsule (360 mg total) by mouth daily. 05/18/15  Yes Corky Crafts, MD  furosemide (LASIX) 40 MG tablet Take 1.5 tablets (60 mg total)  by mouth daily. 06/18/15  Yes Vassie Lollarlos Madera, MD  labetalol (NORMODYNE) 200 MG tablet Take 1 tablet (200 mg total) by mouth 2 (two) times daily. 04/12/15  Yes Corky CraftsJayadeep S Varanasi, MD  latanoprost (XALATAN) 0.005 % ophthalmic solution Place 1 drop into both eyes at bedtime. 01/10/14  Yes Historical Provider, MD  levothyroxine (SYNTHROID, LEVOTHROID) 100 MCG tablet Take 100 mcg by mouth daily.   Yes Historical Provider, MD  Polyethyl Glycol-Propyl Glycol (SYSTANE OP) Apply 2 drops to eye daily as needed (dry eyes).    Yes Historical  Provider, MD  potassium chloride SA (K-DUR,KLOR-CON) 20 MEQ tablet Take 20 mEq by mouth daily.   Yes Historical Provider, MD  timolol (TIMOPTIC) 0.5 % ophthalmic solution Place 1 drop into both eyes every morning.  12/28/13  Yes Historical Provider, MD  warfarin (COUMADIN) 1 MG tablet TAKE AS DIRECTED BY COUMADIN CLINIC Patient taking differently: Takes 2mg  everyday except 2.5mg  on Mondays and Friday 07/26/15  Yes Corky CraftsJayadeep S Varanasi, MD  acetaminophen (TYLENOL) 325 MG tablet Take 2 tablets (650 mg total) by mouth every 6 (six) hours as needed for mild pain, fever or headache. Patient not taking: Reported on 08/10/2015 06/18/15   Vassie Lollarlos Madera, MD  benzonatate (TESSALON) 200 MG capsule Take 1 capsule (200 mg total) by mouth 3 (three) times daily as needed for cough. Patient not taking: Reported on 08/10/2015 06/18/15   Vassie Lollarlos Madera, MD  guaiFENesin (MUCINEX) 600 MG 12 hr tablet Take 1 tablet (600 mg total) by mouth 2 (two) times daily. Patient not taking: Reported on 08/10/2015 06/18/15   Vassie Lollarlos Madera, MD  pantoprazole (PROTONIX) 40 MG tablet Take 1 tablet (40 mg total) by mouth daily. Patient not taking: Reported on 08/10/2015 06/18/15   Vassie Lollarlos Madera, MD  predniSONE (DELTASONE) 10 MG tablet Take 1 tablet (10 mg total) by mouth daily with breakfast. Patient not taking: Reported on 08/10/2015 06/18/15   Vassie Lollarlos Madera, MD   BP 142/79 mmHg  Pulse 89  Temp(Src) 98.2 F (36.8 C) (Oral)  Resp 16  SpO2 97% Physical Exam  Constitutional: She is oriented to person, place, and time. She appears well-developed and well-nourished. No distress.  HENT:  Head: Normocephalic.  Eyes: Conjunctivae are normal.  Neck: Normal range of motion. Neck supple.  Cardiovascular: Normal rate and regular rhythm.   Murmur heard. Pulmonary/Chest: Effort normal and breath sounds normal. No respiratory distress. She has no wheezes. She has no rales.  Abdominal: Soft. Bowel sounds are normal. She exhibits no distension. There is  no tenderness. There is no rebound.  Musculoskeletal: She exhibits edema.  3+ lower extremity pitting edema bilaterally  Neurological: She is alert and oriented to person, place, and time.  Skin: Skin is warm and dry.  Psychiatric: She has a normal mood and affect. Her behavior is normal.  Nursing note and vitals reviewed.   ED Course  Procedures (including critical care time) Labs Review Labs Reviewed  CBC WITH DIFFERENTIAL/PLATELET - Abnormal; Notable for the following:    Hemoglobin 11.2 (*)    HCT 35.8 (*)    RDW 18.2 (*)    All other components within normal limits  COMPREHENSIVE METABOLIC PANEL - Abnormal; Notable for the following:    Glucose, Bld 100 (*)    Creatinine, Ser 1.32 (*)    GFR calc non Af Amer 34 (*)    GFR calc Af Amer 39 (*)    All other components within normal limits  BRAIN NATRIURETIC PEPTIDE - Abnormal; Notable for the  following:    B Natriuretic Peptide 533.1 (*)    All other components within normal limits  PROTIME-INR - Abnormal; Notable for the following:    Prothrombin Time 22.4 (*)    INR 2.07 (*)    All other components within normal limits  Rosezena Sensor, ED    Imaging Review Dg Chest 2 View  08/10/2015  CLINICAL DATA:  82 year old female increased blood pressure EXAM: CHEST  2 VIEW COMPARISON:  06/17/2015, 06/13/2015, chest CT 06/12/2015 FINDINGS: Cardiomediastinal silhouette likely unchanged, partially obscured by overlying lung/pleural disease. Increasing opacity at the left base, with blunting of the left costophrenic angle, obscuration of the left hemidiaphragm and retrocardiac region. Opacity in the costophrenic sulcus on the lateral view with meniscus present. Coarsened interstitial markings bilaterally. No interlobular septal thickening. Likely small right-sided pleural effusion. Stay linear opacities at the hilar regions, similar to comparison. No pneumothorax. Treated lower thoracic compression fracture again noted. Treated lumbar  compression fracture noted. Atherosclerosis. IMPRESSION: Enlarging left-sided pleural effusion, with associated atelectasis/ consolidation. No evidence of overt edema. Small right-sided pleural effusion. Signed, Yvone Neu. Loreta Ave, DO [facility] Electronically Signed   By: Gilmer Mor D.O.   On: 08/10/2015 11:09   I have personally reviewed and evaluated these images and lab results as part of my medical decision-making.   EKG Interpretation   Date/Time:  Thursday Aug 10 2015 11:59:08 EDT Ventricular Rate:  95 PR Interval:  212 QRS Duration: 126 QT Interval:  404 QTC Calculation: 508 R Axis:   -72 Text Interpretation:  Atrial fibrillation Ventricular bigeminy Borderline  prolonged PR interval Nonspecific IVCD with LAD Nonspecific T  abnormalities, lateral leads Abnormal ekg Confirmed by Gerhard Munch   MD [id number]) on 08/10/2015 1:59:36 PM      MDM   Final diagnoses:  Pleural effusion  Peripheral edema  HCAP (healthcare-associated pneumonia)   Patient with rapid heart rate and shortness of breath earlier, states it improved, and currently feels at baseline. History of A. fib, patient is in chronic A. fib and is on Coumadin. Patient's power of attorney is with patient, states that patient was still very short of breath when she got to her and she wanted her to be evaluated. We'll check labs, EKG, chest x-ray. Patient's oxygen saturations in the low 90s on room air.   3:33 PM X-ray showing enlarging left-sided pleural effusion with associated atelectasis versus consultation. Patient does appear to be fluid overloaded with 3+ pitting edema in lower extremities bilaterally. We'll start on IV Lasix. Also will start antibiotics for age Given recent admission in March. Discussed with Dr. Jeraldine Loots, advised admission for monitor and diuresis and antibiotics.   3:49 PM Spoke with [facility], will admit. Asked for cardiology  consult  Filed Vitals:   08/10/15 1353 08/10/15 1400 08/10/15 1500 08/10/15 1538  BP: 146/76 141/57 143/61   Pulse: 77 49 63   Temp: 97.5 F (36.4 C)     TempSrc: Oral     Resp: 20 16 16    Weight:    70.308 kg  SpO2: 92% 93% 94%      Jaynie Crumble, PA-C 08/10/15 1655  Gerhard Munch, MD 08/11/15 9604  Gerhard Munch, MD 08/11/15 [phone]

## 2015-08-10 NOTE — ED Notes (Signed)
Nurse is going to try and get labs on the patient.

## 2015-08-10 NOTE — ED Notes (Signed)
Judy/POA [phone]

## 2015-08-10 NOTE — Progress Notes (Signed)
Pharmacy Antibiotic Follow-up Note  Tonya Farley is a 82 y.o. year-old female admitted on 08/10/2015.  The patient is currently on day 1/8 of Vancomycin for PNA.  Assessment/Plan: Vancomycin 1gm x1 in ED, Trough goal 15-20 mcg/ml Aztreonam 2gm x1 in ED,  Antibiotics discontinued  Temp (24hrs), Avg:97.9 F (36.6 C), Min:97.5 F (36.4 C), Max:98.2 F (36.8 C)   Recent Labs Lab 08/10/15 1348  WBC 8.3    Recent Labs Lab 08/10/15 1348  CREATININE 1.32*   CrCl cannot be calculated (Unknown ideal weight.).    Allergies  Allergen Reactions  . Penicillins Hives and Swelling    Has patient had a PCN reaction causing immediate rash, facial/tongue/throat swelling, SOB or lightheadedness with hypotension: Yes Has patient had a PCN reaction causing severe rash involving mucus membranes or skin necrosis: Yes Has patient had a PCN reaction that required hospitalization No Has patient had a PCN reaction occurring within the last 10 years: No If all of the above answers are "NO", then may proceed with Cephalosporin use.    . Sulfa Antibiotics Anaphylaxis and Swelling   Antimicrobials this admission: 5/18 Aztreonam  >> 5/18 5/18 Vancomycin >> 5/18  Levels/dose changes this admission:  Microbiology results:  Thank you for allowing pharmacy to be a part of this patient's care.  Tonya Farley, Tonya Farley PharmD 08/10/2015 2:48 PM

## 2015-08-10 NOTE — ED Notes (Addendum)
Pt c/o hypertension and tachycardia this morning.  Denies pain.  Denies n/v/d.  Pt reports BP and heart rate were elevated this morning.  Vitals WDL, during Triage.  Hx of A Fib.     Pt's sitter concerned about Pt's breathing d/t Pt being diagnosed w/ PNA and the Flu in March.  NAD noted.  Pt easily speaking full sentences.  Pt denying SOB.

## 2015-08-10 NOTE — H&P (Signed)
History and Physical  Tonya Farley XBM:[id number] DOB: [birth year] DOA: 08/10/2015  Referring physician: EDP PCP: Lorenda Peck, MD   Chief Complaint: sob, tachycardia  HPI: Tonya Farley is a 82 y.o. female independent from home  with h/o hypothyroidism, H/o chronic afib on coumadin, h/o diastolic chf presented to ED due to progressive sob, tachycardia, lower extremity edema. She denies cough, no fever, no chest pain. She reported was treated for pna and pleural effusion in the hospital in march and was sent to rehab for two weeks , then she was discharged home from rehab.   ED course: she was in afib/rvr initially on presentation, she is on room air, not in acute respiratory distress at rest, cxr with enlarging left sided pleural effusion, with associated atelectasis/consulidation, no evidence of over edema, small right sided pleural effusion. ekg with afib/rvr, bnp 533, troponin negative, she does not have fever, no leukocytosis, cr 1.32  (baseline 1.13), INR 2.07, she was given lasix  iv and vanc/aztreonam, cardiology consulted for afib/chf, hospitalist called to admit the patient.   Review of Systems:  Detail per HPI, Review of systems are otherwise negative  Past Medical History  Diagnosis Date  . Hypertension   . Hyperlipidemia   . Thyroid disease   . Hypothyroidism   . Chronic back pain   . PONV (postoperative nausea and vomiting)   . Chronic a-fib (HCC)   . A-fib (HCC)   . PNA (pneumonia)    Past Surgical History  Procedure Laterality Date  . Appendectomy    . Tonsillectomy    . Cataracts      bilateral  . Eye surgery      left eye "hole"   Social History:  reports that she has never smoked. She has never used smokeless tobacco. She reports that she does not drink alcohol or use illicit drugs. Patient lives at home & is able to participate in activities of daily living independently   Allergies  Allergen Reactions  . Penicillins Hives and  Swelling    Has patient had a PCN reaction causing immediate rash, facial/tongue/throat swelling, SOB or lightheadedness with hypotension: Yes Has patient had a PCN reaction causing severe rash involving mucus membranes or skin necrosis: Yes Has patient had a PCN reaction that required hospitalization No Has patient had a PCN reaction occurring within the last 10 years: No If all of the above answers are "NO", then may proceed with Cephalosporin use.    . Sulfa Antibiotics Anaphylaxis and Swelling    Family History  Problem Relation Age of Onset  . Heart disease Sister   . Heart disease Sister   . Cancer Mother   . Other Father   . Heart attack Father       Prior to Admission medications   Medication Sig Start Date End Date Taking? Authorizing Provider  atorvastatin (LIPITOR) 10 MG tablet Take 10 mg by mouth daily.   Yes Historical Provider, MD  B Complex-C (B-COMPLEX WITH VITAMIN C) tablet Take 1 tablet by mouth daily.   Yes Historical Provider, MD  brimonidine (ALPHAGAN) 0.15 % ophthalmic solution Place 1 drop into both eyes 3 (three) times daily.   Yes Historical Provider, MD  budesonide (PULMICORT) 0.5 MG/2ML nebulizer solution Take 2 mLs (0.5 mg total) by nebulization 2 (two) times daily. 06/18/15  Yes Vassie Loll, MD  Cholecalciferol (VITAMIN D PO) Take 1 tablet by mouth daily.    Yes Historical Provider, MD  diltiazem (CARDIZEM CD) 360 MG  24 hr capsule Take 1 capsule (360 mg total) by mouth daily. 05/18/15  Yes Corky Crafts, MD  furosemide (LASIX) 40 MG tablet Take 1.5 tablets (60 mg total) by mouth daily. 06/18/15  Yes Vassie Loll, MD  labetalol (NORMODYNE) 200 MG tablet Take 1 tablet (200 mg total) by mouth 2 (two) times daily. 04/12/15  Yes Corky Crafts, MD  latanoprost (XALATAN) 0.005 % ophthalmic solution Place 1 drop into both eyes at bedtime. 01/10/14  Yes Historical Provider, MD  levothyroxine (SYNTHROID, LEVOTHROID) 100 MCG tablet Take 100 mcg by mouth  daily.   Yes Historical Provider, MD  Polyethyl Glycol-Propyl Glycol (SYSTANE OP) Apply 2 drops to eye daily as needed (dry eyes).    Yes Historical Provider, MD  potassium chloride SA (K-DUR,KLOR-CON) 20 MEQ tablet Take 20 mEq by mouth daily.   Yes Historical Provider, MD  timolol (TIMOPTIC) 0.5 % ophthalmic solution Place 1 drop into both eyes every morning.  12/28/13  Yes Historical Provider, MD  warfarin (COUMADIN) 1 MG tablet TAKE AS DIRECTED BY COUMADIN CLINIC Patient taking differently: Takes 2mg  everyday except 2.5mg  on Mondays and Friday 07/26/15  Yes Corky Crafts, MD  acetaminophen (TYLENOL) 325 MG tablet Take 2 tablets (650 mg total) by mouth every 6 (six) hours as needed for mild pain, fever or headache. Patient not taking: Reported on 08/10/2015 06/18/15   Vassie Loll, MD  benzonatate (TESSALON) 200 MG capsule Take 1 capsule (200 mg total) by mouth 3 (three) times daily as needed for cough. Patient not taking: Reported on 08/10/2015 06/18/15   Vassie Loll, MD  guaiFENesin (MUCINEX) 600 MG 12 hr tablet Take 1 tablet (600 mg total) by mouth 2 (two) times daily. Patient not taking: Reported on 08/10/2015 06/18/15   Vassie Loll, MD  pantoprazole (PROTONIX) 40 MG tablet Take 1 tablet (40 mg total) by mouth daily. Patient not taking: Reported on 08/10/2015 06/18/15   Vassie Loll, MD  predniSONE (DELTASONE) 10 MG tablet Take 1 tablet (10 mg total) by mouth daily with breakfast. Patient not taking: Reported on 08/10/2015 06/18/15   Vassie Loll, MD    Physical Exam: BP 143/61 mmHg  Pulse 63  Temp(Src) 97.5 F (36.4 C) (Oral)  Resp 16  Wt 70.308 kg (155 lb)  SpO2 94%  General:  Appear younger than stated age Eyes: PERRL ENT: unremarkable Neck: supple, no JVD Cardiovascular: IRRR Respiratory: diminished at basis, no wheezing, no rhonchi Abdomen: soft/ND/ND, positive bowel sounds Skin: no rash Musculoskeletal:  3+pitting edema bilateral lower extremity Psychiatric:  calm/cooperative Neurologic: no focal findings            Labs on Admission:  Basic Metabolic Panel:  Recent Labs Lab 08/10/15 1348  NA 143  K 4.0  CL 108  CO2 27  GLUCOSE 100*  BUN 20  CREATININE 1.32*  CALCIUM 9.3   Liver Function Tests:  Recent Labs Lab 08/10/15 1348  AST 17  ALT 18  ALKPHOS 68  BILITOT 0.9  PROT 6.8  ALBUMIN 3.6   No results for input(s): LIPASE, AMYLASE in the last 168 hours. No results for input(s): AMMONIA in the last 168 hours. CBC:  Recent Labs Lab 08/10/15 1348  WBC 8.3  NEUTROABS 6.1  HGB 11.2*  HCT 35.8*  MCV 87.3  PLT 281   Cardiac Enzymes: No results for input(s): CKTOTAL, CKMB, CKMBINDEX, TROPONINI in the last 168 hours.  BNP (last 3 results)  Recent Labs  06/03/15 1611 06/14/15 0813 08/10/15 1348  BNP 358.1*  365.3* 533.1*    ProBNP (last 3 results) No results for input(s): PROBNP in the last 8760 hours.  CBG: No results for input(s): GLUCAP in the last 168 hours.  Radiological Exams on Admission: Dg Chest 2 View  08/10/2015  CLINICAL DATA:  82 year old female increased blood pressure EXAM: CHEST  2 VIEW COMPARISON:  06/17/2015, 06/13/2015, chest CT 06/12/2015 FINDINGS: Cardiomediastinal silhouette likely unchanged, partially obscured by overlying lung/pleural disease. Increasing opacity at the left base, with blunting of the left costophrenic angle, obscuration of the left hemidiaphragm and retrocardiac region. Opacity in the costophrenic sulcus on the lateral view with meniscus present. Coarsened interstitial markings bilaterally. No interlobular septal thickening. Likely small right-sided pleural effusion. Stay linear opacities at the hilar regions, similar to comparison. No pneumothorax. Treated lower thoracic compression fracture again noted. Treated lumbar compression fracture noted. Atherosclerosis. IMPRESSION: Enlarging left-sided pleural effusion, with associated atelectasis/ consolidation. No evidence of  overt edema. Small right-sided pleural effusion. Signed, Yvone NeuJaime S. Loreta AveWagner, DO [facility] Electronically Signed   By: Gilmer MorJaime  Wagner D.O.   On: 08/10/2015 11:09    EKG: Independently reviewed. afib/rvr  Assessment/Plan Present on Admission:  . Anasarca  Sob with pleural effusion/bilateral lower extremity edema with h/o chronic afib and diastolic chf, bnp elevated compare to baseline, possible acute on chronic diastolic chf, will increase lasix to 40mg  bid, cardiology consulted by EDP.  Patient currently nontoxic appearing, no leukocytosis, no fever, will hold off on abx, will get thoracentesis for culture/fluids studies.  Afib/rvr: continue home meds cardizem/labetalol/coumadin , cardiology consulted  Hypothyroidism:  Continue synthroid  DVT prophylaxis: on coumadin  Consultants: cardiology   Code Status: DNR  Family Communication:  Patient   Disposition Plan: admit to tele obs  Time spent: 75mins  Nataliee Shurtz MD, PhD [facility] Pager [phone] If 7PM-7AM, please contact night-coverage at [email or web address], password [email or web address]

## 2015-08-10 NOTE — ED Notes (Signed)
Darel HongJudy, patient's POA called and wanted to check on the patient. Information given. Darel HongJudy stated, "DId you see her purse. You better not lose it.' Explained to patient that I normally did not inquire about belongings unless they were going to be admitted. Darel HongJudy stated, "well, you better not lose it." "Let's be for real here. 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

## 2015-08-10 NOTE — ED Notes (Signed)
PT CAN GO UP AT 16:28

## 2015-08-10 NOTE — Consult Note (Signed)
CARDIOLOGY CONSULT NOTE   Patient ID: Tonya Farley MRN: [id number] DOB/AGE: 30-May-1919 82 y.o.  Admit date: 08/10/2015  Requesting Physician: Dr. Roda Shutters Primary Physician:   Lorenda Peck, MD Primary Cardiologist:  Dr. Eldridge Dace Reason for Consultation:  CHF  HPI: Tonya Farley is a 82 y.o. female with a history of renal artery stenosis, HTN, chronic Afib on coumadin, and hypothyroidism who presented to [facility] ED today with tachycardia and SOB.   She was admitted in 05/2015 for PNA with bilateral pleural effusions. Did have a right thoracentesis during that hospital admission Previous to that she was admitted for influenza.   Now here with SOB and tachycardia. She took an extra labetelol 100mg  to treat this.    CXR with enlarging left sided pleural effusion and possible consolidation and small right pleural effusion.  Telemetry in ER showed rapid afib Rhythm is chronic.  She has no chest pain.  Weight up and some LE edema as well  Echo 06/04/15 showed normal EF with non critical AS and some RV failure with estimated PA pressure of 65 mmHg   Past Medical History  Diagnosis Date  . Hypertension   . Hyperlipidemia   . Thyroid disease   . Hypothyroidism   . Chronic back pain   . PONV (postoperative nausea and vomiting)   . Chronic a-fib (HCC)   . A-fib (HCC)   . PNA (pneumonia)      Past Surgical History  Procedure Laterality Date  . Appendectomy    . Tonsillectomy    . Cataracts      bilateral  . Eye surgery      left eye "hole"    Allergies  Allergen Reactions  . Penicillins Hives and Swelling    Has patient had a PCN reaction causing immediate rash, facial/tongue/throat swelling, SOB or lightheadedness with hypotension: Yes Has patient had a PCN reaction causing severe rash involving mucus membranes or skin necrosis: Yes Has patient had a PCN reaction that required hospitalization No Has patient had a PCN reaction occurring within the last 10  years: No If all of the above answers are "NO", then may proceed with Cephalosporin use.    . Sulfa Antibiotics Anaphylaxis and Swelling    I have reviewed the patient's current medications   . aztreonam 2 g (08/10/15 1632)     Prior to Admission medications   Medication Sig Start Date End Date Taking? Authorizing Provider  atorvastatin (LIPITOR) 10 MG tablet Take 10 mg by mouth daily.   Yes Historical Provider, MD  B Complex-C (B-COMPLEX WITH VITAMIN C) tablet Take 1 tablet by mouth daily.   Yes Historical Provider, MD  brimonidine (ALPHAGAN) 0.15 % ophthalmic solution Place 1 drop into both eyes 3 (three) times daily.   Yes Historical Provider, MD  budesonide (PULMICORT) 0.5 MG/2ML nebulizer solution Take 2 mLs (0.5 mg total) by nebulization 2 (two) times daily. 06/18/15  Yes Vassie Loll, MD  Cholecalciferol (VITAMIN D PO) Take 1 tablet by mouth daily.    Yes Historical Provider, MD  diltiazem (CARDIZEM CD) 360 MG 24 hr capsule Take 1 capsule (360 mg total) by mouth daily. 05/18/15  Yes Corky Crafts, MD  furosemide (LASIX) 40 MG tablet Take 1.5 tablets (60 mg total) by mouth daily. 06/18/15  Yes Vassie Loll, MD  labetalol (NORMODYNE) 200 MG tablet Take 1 tablet (200 mg total) by mouth 2 (two) times daily. 04/12/15  Yes Corky Crafts, MD  latanoprost Harrel Lemon)  0.005 % ophthalmic solution Place 1 drop into both eyes at bedtime. 01/10/14  Yes Historical Provider, MD  levothyroxine (SYNTHROID, LEVOTHROID) 100 MCG tablet Take 100 mcg by mouth daily.   Yes Historical Provider, MD  Polyethyl Glycol-Propyl Glycol (SYSTANE OP) Apply 2 drops to eye daily as needed (dry eyes).    Yes Historical Provider, MD  potassium chloride SA (K-DUR,KLOR-CON) 20 MEQ tablet Take 20 mEq by mouth daily.   Yes Historical Provider, MD  timolol (TIMOPTIC) 0.5 % ophthalmic solution Place 1 drop into both eyes every morning.  12/28/13  Yes Historical Provider, MD  warfarin (COUMADIN) 1 MG tablet TAKE AS  DIRECTED BY COUMADIN CLINIC Patient taking differently: Takes  everyday except 2.5mg  on Mondays and Friday 07/26/15  Yes Corky Crafts, MD  acetaminophen (TYLENOL) 325 MG tablet Take 2 tablets (650 mg total) by mouth every 6 (six) hours as needed for mild pain, fever or headache. Patient not taking: Reported on 08/10/2015 06/18/15   Vassie Loll, MD  benzonatate (TESSALON) 200 MG capsule Take 1 capsule (200 mg total) by mouth 3 (three) times daily as needed for cough. Patient not taking: Reported on 08/10/2015 06/18/15   Vassie Loll, MD  guaiFENesin (MUCINEX) 600 MG 12 hr tablet Take 1 tablet (600 mg total) by mouth 2 (two) times daily. Patient not taking: Reported on 08/10/2015 06/18/15   Vassie Loll, MD  pantoprazole (PROTONIX) 40 MG tablet Take 1 tablet (40 mg total) by mouth daily. Patient not taking: Reported on 08/10/2015 06/18/15   Vassie Loll, MD  predniSONE (DELTASONE) 10 MG tablet Take 1 tablet (10 mg total) by mouth daily with breakfast. Patient not taking: Reported on 08/10/2015 06/18/15   Vassie Loll, MD     Social History   Social History  . Marital Status: Widowed    Spouse Name: N/A  . Number of Children: N/A  . Years of Education: N/A   Occupational History  . Not on file.   Social History Main Topics  . Smoking status: Never Smoker   . Smokeless tobacco: Never Used  . Alcohol Use: No  . Drug Use: No  . Sexual Activity: Not on file   Other Topics Concern  . Not on file   Social History Narrative    Family Status  Relation Status Death Age  . Sister Alive   . Sister Alive   . Mother Deceased   . Father Deceased    Family History  Problem Relation Age of Onset  . Heart disease Sister   . Heart disease Sister   . Cancer Mother   . Other Father   . Heart attack Father      ROS:  Full 14 point review of systems complete and found to be negative unless listed above.  BP 146/100 mmHg  Pulse 106  Temp(Src) 97.5 F (36.4 C) (Oral)  Resp 20  Wt  70.308 kg (155 lb)  SpO2 92%  Affect appropriate Elderly white female  HEENT: normal Neck supple with no adenopathy JVP elevated  no bruits no thyromegaly Lungs decreased BS left base  Heart:  S1/S2 SEM  murmur, no rub, gallop or click PMI normal Abdomen: benighn, BS positve, no tenderness, no AAA no bruit.  No HSM or HJR Distal pulses intact with no bruits Plus 2 LE edema Neuro non-focal Skin warm and dry No muscular weakness   Labs:   Lab Results  Component Value Date   WBC 8.3 08/10/2015   HGB 11.2* 08/10/2015  HCT 35.8* 08/10/2015   MCV 87.3 08/10/2015   PLT 281 08/10/2015    Recent Labs  08/10/15 1348  INR 2.07*    Recent Labs Lab 08/10/15 1348  NA 143  K 4.0  CL 108  CO2 27  BUN 20  CREATININE 1.32*  CALCIUM 9.3  PROT 6.8  BILITOT 0.9  ALKPHOS 68  ALT 18  AST 17  GLUCOSE 100*  ALBUMIN 3.6   MAGNESIUM  Date Value Ref Range Status  06/13/2015 2.1 1.7 - 2.4 mg/dL Final    Recent Labs  09/81/1905/18/17 1357  TROPIPOC 0.01    TSH  Date/Time Value Ref Range Status  06/10/2015 11:47 AM 1.325 0.350 - 4.500 uIU/mL Final   No results found for: VITAMINB12, FOLATE, FERRITIN, TIBC, IRON, RETICCTPCT  Echo: Study Date: 06/04/2015 LV EF: 55% Study Conclusions - Left ventricle: The cavity size was normal. Wall thickness was  increased in a pattern of mild LVH. The estimated ejection  fraction was 55%. Wall motion was normal; there were no regional  wall motion abnormalities. The study is not technically  sufficient to allow evaluation of LV diastolic function. - Aortic valve: Trileaflet; mildly calcified leaflets. There was  moderate low gradient stenosis. There was trivial regurgitation.  Mean gradient (S): 10 mm Hg. Peak gradient (S): 21 mm Hg. Peak  velocity ratio of LVOT to aortic valve: 0.33. Valve area (Vmax):  1.15 cm^2. - Mitral valve: Calcified annulus. There was mild regurgitation. - Left atrium: The atrium was severely dilated. -  Right ventricle: The cavity size was mildly dilated. Systolic  function was moderately reduced. - Right atrium: The atrium was mildly dilated. Central venous  pressure (est): 3 mm Hg. - Tricuspid valve: There was moderate regurgitation. - Pulmonary arteries: Systolic pressure was severely increased. PA  peak pressure: 65 mm Hg (S). - Pericardium, extracardiac: There was no pericardial effusion. Impressions: - Mild LVH with LVEF approximately 55%. Indeterminate diastolic  function in the setting of atrial fibrillation. Severe left  atrial enlargement. MAC with mild mitral regurgitation. Moderate,  low gradient, calcific aortic stenosis. Mildly dilated RV with  moderately reduced contraction. Moderate tricuspid regurgitation  with evidence of severe pulmonary hypertension, PASP 65 mmHg.   ECG:  afib no acute ST changes   Radiology:  Dg Chest 2 View  08/10/2015  CLINICAL DATA:  82 year old female increased blood pressure EXAM: CHEST  2 VIEW COMPARISON:  06/17/2015, 06/13/2015, chest CT 06/12/2015 FINDINGS: Cardiomediastinal silhouette likely unchanged, partially obscured by overlying lung/pleural disease. Increasing opacity at the left base, with blunting of the left costophrenic angle, obscuration of the left hemidiaphragm and retrocardiac region. Opacity in the costophrenic sulcus on the lateral view with meniscus present. Coarsened interstitial markings bilaterally. No interlobular septal thickening. Likely small right-sided pleural effusion. Stay linear opacities at the hilar regions, similar to comparison. No pneumothorax. Treated lower thoracic compression fracture again noted. Treated lumbar compression fracture noted. Atherosclerosis. IMPRESSION: Enlarging left-sided pleural effusion, with associated atelectasis/ consolidation. No evidence of overt edema. Small right-sided pleural effusion. Signed, Yvone NeuJaime S. Loreta AveWagner, DO [facility] Electronically Signed   By: Gilmer MorJaime  Wagner D.O.   On: 08/10/2015 11:09    ASSESSMENT AND PLAN:    Active Problems:   Anasarca  Dyspnea:  She has had recurrent pleural effusion post pneumonia and influenza.  Not clear to me that this is cardiac related. Increase lasix to bid. Would repeat CXR In am with left lateral decubitus and have radiology proceed  with left thoracentesis.  Echo shows normal LV function but some pulmonary hypertension add vasodilator revatio for pre load reduction.   Afib:  Chronic on coumadin continue cardizem and increase labetalol to 300 bid.   Lab Results  Component Value Date   INR 2.07* 08/10/2015   INR 1.6 08/02/2015   INR 1.3 07/25/2015   Murmur:  Mild to moderate AS on last echo no need to repeat at this time   Thyroid:   Continue synthroid replacement  Lab Results  Component Value Date   TSH 1.325 06/10/2015    Charlton Haws

## 2015-08-10 NOTE — Progress Notes (Signed)
ANTICOAGULATION CONSULT NOTE - Initial Consult  Pharmacy Consult for Warfarin Indication: atrial fibrillation  Allergies  Allergen Reactions  . Penicillins Hives and Swelling    Has patient had a PCN reaction causing immediate rash, facial/tongue/throat swelling, SOB or lightheadedness with hypotension: Yes Has patient had a PCN reaction causing severe rash involving mucus membranes or skin necrosis: Yes Has patient had a PCN reaction that required hospitalization No Has patient had a PCN reaction occurring within the last 10 years: No If all of the above answers are "NO", then may proceed with Cephalosporin use.    . Sulfa Antibiotics Anaphylaxis and Swelling   Patient Measurements: Height: 5\' 4"  (162.6 cm) Weight: 155 lb (70.308 kg) IBW/kg (Calculated) : 54.7  Vital Signs: Temp: 98.3 F (36.8 C) (05/18 1700) Temp Source: Oral (05/18 1700) BP: 157/60 mmHg (05/18 1700) Pulse Rate: 86 (05/18 1700)  Labs:  Recent Labs  08/10/15 1348  HGB 11.2*  HCT 35.8*  PLT 281  LABPROT 22.4*  INR 2.07*  CREATININE 1.32*   Estimated Creatinine Clearance: 25.6 mL/min (by C-G formula based on Cr of 1.32).  Medical History: Past Medical History  Diagnosis Date  . Hypertension   . Hyperlipidemia   . Thyroid disease   . Hypothyroidism   . Chronic back pain   . PONV (postoperative nausea and vomiting)   . Chronic a-fib (HCC)   . A-fib (HCC)   . PNA (pneumonia)    Medications:  Scheduled:  . [START ON 08/11/2015] atorvastatin  10 mg Oral Daily  . [START ON 08/11/2015] B-complex with vitamin C  1 tablet Oral Daily  . brimonidine  1 drop Both Eyes TID  . budesonide  0.5 mg Nebulization BID  . [START ON 08/11/2015] diltiazem  360 mg Oral Daily  . furosemide  40 mg Intravenous BID  . labetalol  200 mg Oral BID  . latanoprost  1 drop Both Eyes QHS  . [START ON 08/11/2015] levothyroxine  100 mcg Oral QAC breakfast  . [START ON 08/11/2015] pantoprazole  40 mg Oral Daily  . [START ON  08/11/2015] potassium chloride SA  20 mEq Oral Daily  . [START ON 08/11/2015] timolol  1 drop Both Eyes q morning - 10a   Assessment: 89 yoF to ED with SHOB, elevated HR - took extra 1/2 tab Labetalol to attempt to lower HR. Recent admit 3/7 for PNA.  CXray with enlarging L pleural effusion, small R effusion. Warfarin for Afib; home dose 2mg  daily, exc 2.5mg  M,F with LD 5/17 at 1900  Admit INR 2.07  Goal of Therapy:  INR 2-3 Monitor CBC   Plan:   Warfarin 2mg  tonight  Daily PT/INR  Monitor CBC, sign/sx of bleed  Otho BellowsGreen, Davanta Meuser L PharmD Pager [phone] 08/10/2015, 7:18 PM

## 2015-08-11 ENCOUNTER — Observation Stay (HOSPITAL_COMMUNITY): Payer: Medicare Other

## 2015-08-11 DIAGNOSIS — I272 Other secondary pulmonary hypertension: Secondary | ICD-10-CM | POA: Diagnosis present

## 2015-08-11 DIAGNOSIS — Z7952 Long term (current) use of systemic steroids: Secondary | ICD-10-CM | POA: Diagnosis not present

## 2015-08-11 DIAGNOSIS — R600 Localized edema: Secondary | ICD-10-CM | POA: Insufficient documentation

## 2015-08-11 DIAGNOSIS — R609 Edema, unspecified: Secondary | ICD-10-CM | POA: Insufficient documentation

## 2015-08-11 DIAGNOSIS — E785 Hyperlipidemia, unspecified: Secondary | ICD-10-CM | POA: Diagnosis present

## 2015-08-11 DIAGNOSIS — Z66 Do not resuscitate: Secondary | ICD-10-CM | POA: Diagnosis present

## 2015-08-11 DIAGNOSIS — J948 Other specified pleural conditions: Secondary | ICD-10-CM | POA: Diagnosis not present

## 2015-08-11 DIAGNOSIS — I5033 Acute on chronic diastolic (congestive) heart failure: Secondary | ICD-10-CM | POA: Diagnosis not present

## 2015-08-11 DIAGNOSIS — R627 Adult failure to thrive: Secondary | ICD-10-CM | POA: Diagnosis present

## 2015-08-11 DIAGNOSIS — J918 Pleural effusion in other conditions classified elsewhere: Secondary | ICD-10-CM | POA: Diagnosis present

## 2015-08-11 DIAGNOSIS — Z79899 Other long term (current) drug therapy: Secondary | ICD-10-CM | POA: Diagnosis not present

## 2015-08-11 DIAGNOSIS — I482 Chronic atrial fibrillation: Secondary | ICD-10-CM | POA: Diagnosis not present

## 2015-08-11 DIAGNOSIS — E039 Hypothyroidism, unspecified: Secondary | ICD-10-CM | POA: Diagnosis present

## 2015-08-11 DIAGNOSIS — I130 Hypertensive heart and chronic kidney disease with heart failure and stage 1 through stage 4 chronic kidney disease, or unspecified chronic kidney disease: Secondary | ICD-10-CM | POA: Diagnosis present

## 2015-08-11 DIAGNOSIS — E876 Hypokalemia: Secondary | ICD-10-CM | POA: Insufficient documentation

## 2015-08-11 DIAGNOSIS — R0600 Dyspnea, unspecified: Secondary | ICD-10-CM | POA: Diagnosis not present

## 2015-08-11 DIAGNOSIS — Z880 Allergy status to penicillin: Secondary | ICD-10-CM | POA: Diagnosis not present

## 2015-08-11 DIAGNOSIS — G8929 Other chronic pain: Secondary | ICD-10-CM | POA: Diagnosis present

## 2015-08-11 DIAGNOSIS — Z7901 Long term (current) use of anticoagulants: Secondary | ICD-10-CM | POA: Diagnosis not present

## 2015-08-11 DIAGNOSIS — I701 Atherosclerosis of renal artery: Secondary | ICD-10-CM | POA: Diagnosis present

## 2015-08-11 DIAGNOSIS — I350 Nonrheumatic aortic (valve) stenosis: Secondary | ICD-10-CM | POA: Diagnosis present

## 2015-08-11 DIAGNOSIS — J90 Pleural effusion, not elsewhere classified: Secondary | ICD-10-CM | POA: Diagnosis not present

## 2015-08-11 DIAGNOSIS — Z7951 Long term (current) use of inhaled steroids: Secondary | ICD-10-CM | POA: Diagnosis not present

## 2015-08-11 DIAGNOSIS — N183 Chronic kidney disease, stage 3 (moderate): Secondary | ICD-10-CM | POA: Diagnosis present

## 2015-08-11 DIAGNOSIS — Z882 Allergy status to sulfonamides status: Secondary | ICD-10-CM | POA: Diagnosis not present

## 2015-08-11 LAB — CBC WITH DIFFERENTIAL/PLATELET
BASOS ABS: 0 10*3/uL (ref 0.0–0.1)
BASOS PCT: 0 %
EOS ABS: 0.2 10*3/uL (ref 0.0–0.7)
EOS PCT: 2 %
HEMATOCRIT: 33.2 % — AB (ref 36.0–46.0)
Hemoglobin: 10.5 g/dL — ABNORMAL LOW (ref 12.0–15.0)
Lymphocytes Relative: 12 %
Lymphs Abs: 1.1 10*3/uL (ref 0.7–4.0)
MCH: 27 pg (ref 26.0–34.0)
MCHC: 31.6 g/dL (ref 30.0–36.0)
MCV: 85.3 fL (ref 78.0–100.0)
MONO ABS: 0.9 10*3/uL (ref 0.1–1.0)
MONOS PCT: 10 %
Neutro Abs: 7.1 10*3/uL (ref 1.7–7.7)
Neutrophils Relative %: 76 %
PLATELETS: 255 10*3/uL (ref 150–400)
RBC: 3.89 MIL/uL (ref 3.87–5.11)
RDW: 18.1 % — AB (ref 11.5–15.5)
WBC: 9.3 10*3/uL (ref 4.0–10.5)

## 2015-08-11 LAB — GRAM STAIN

## 2015-08-11 LAB — COMPREHENSIVE METABOLIC PANEL
ALBUMIN: 3.2 g/dL — AB (ref 3.5–5.0)
ALT: 16 U/L (ref 14–54)
ANION GAP: 7 (ref 5–15)
AST: 17 U/L (ref 15–41)
Alkaline Phosphatase: 60 U/L (ref 38–126)
BILIRUBIN TOTAL: 0.6 mg/dL (ref 0.3–1.2)
BUN: 20 mg/dL (ref 6–20)
CHLORIDE: 108 mmol/L (ref 101–111)
CO2: 28 mmol/L (ref 22–32)
Calcium: 8.7 mg/dL — ABNORMAL LOW (ref 8.9–10.3)
Creatinine, Ser: 1.28 mg/dL — ABNORMAL HIGH (ref 0.44–1.00)
GFR calc Af Amer: 40 mL/min — ABNORMAL LOW (ref >60)
GFR calc non Af Amer: 35 mL/min — ABNORMAL LOW (ref >60)
GLUCOSE: 94 mg/dL (ref 65–99)
POTASSIUM: 3.5 mmol/L (ref 3.5–5.1)
SODIUM: 143 mmol/L (ref 135–145)
TOTAL PROTEIN: 5.9 g/dL — AB (ref 6.5–8.1)

## 2015-08-11 LAB — GLUCOSE, SEROUS FLUID: Glucose, Fluid: 123 mg/dL

## 2015-08-11 LAB — PROTIME-INR
INR: 2.19 — AB (ref 0.00–1.49)
PROTHROMBIN TIME: 23.4 s — AB (ref 11.6–15.2)

## 2015-08-11 LAB — BODY FLUID CELL COUNT WITH DIFFERENTIAL
Lymphs, Fluid: 82 %
MONOCYTE-MACROPHAGE-SEROUS FLUID: 15 % — AB (ref 50–90)
Neutrophil Count, Fluid: 3 % (ref 0–25)
Total Nucleated Cell Count, Fluid: 572 cu mm (ref 0–1000)

## 2015-08-11 LAB — PROTEIN, BODY FLUID

## 2015-08-11 LAB — MAGNESIUM: MAGNESIUM: 1.8 mg/dL (ref 1.7–2.4)

## 2015-08-11 LAB — LACTATE DEHYDROGENASE, PLEURAL OR PERITONEAL FLUID: LD FL: 51 U/L — AB (ref 3–23)

## 2015-08-11 MED ORDER — WARFARIN SODIUM 2.5 MG PO TABS
2.5000 mg | ORAL_TABLET | Freq: Once | ORAL | Status: AC
  Administered 2015-08-11: 2.5 mg via ORAL
  Filled 2015-08-11: qty 1

## 2015-08-11 MED ORDER — SILDENAFIL CITRATE 20 MG PO TABS
20.0000 mg | ORAL_TABLET | Freq: Three times a day (TID) | ORAL | Status: DC
  Administered 2015-08-11 – 2015-08-13 (×7): 20 mg via ORAL
  Filled 2015-08-11 (×10): qty 1

## 2015-08-11 MED ORDER — POTASSIUM CHLORIDE CRYS ER 20 MEQ PO TBCR
40.0000 meq | EXTENDED_RELEASE_TABLET | Freq: Once | ORAL | Status: AC
Start: 2015-08-11 — End: 2015-08-11
  Administered 2015-08-11: 40 meq via ORAL
  Filled 2015-08-11: qty 2

## 2015-08-11 NOTE — Progress Notes (Signed)
ANTICOAGULATION CONSULT NOTE - Initial Consult  Pharmacy Consult for Warfarin Indication: atrial fibrillation  Allergies  Allergen Reactions  . Penicillins Hives and Swelling    Has patient had a PCN reaction causing immediate rash, facial/tongue/throat swelling, SOB or lightheadedness with hypotension: Yes Has patient had a PCN reaction causing severe rash involving mucus membranes or skin necrosis: Yes Has patient had a PCN reaction that required hospitalization No Has patient had a PCN reaction occurring within the last 10 years: No If all of the above answers are "NO", then may proceed with Cephalosporin use.    . Sulfa Antibiotics Anaphylaxis and Swelling   Patient Measurements: Height: 5\' 4"  (162.6 cm) Weight: 149 lb 8 oz (67.813 kg) IBW/kg (Calculated) : 54.7  Vital Signs: Temp: 97.5 F (36.4 C) (05/19 0520) Temp Source: Oral (05/19 0520) BP: 139/67 mmHg (05/19 0520) Pulse Rate: 87 (05/19 0520)  Labs:  Recent Labs  08/10/15 1348 08/11/15 0504  HGB 11.2* 10.5*  HCT 35.8* 33.2*  PLT 281 255  LABPROT 22.4* 23.4*  INR 2.07* 2.19*  CREATININE 1.32* 1.28*   Estimated Creatinine Clearance: 26 mL/min (by C-G formula based on Cr of 1.28).  Medical History: Past Medical History  Diagnosis Date  . Hypertension   . Hyperlipidemia   . Thyroid disease   . Hypothyroidism   . Chronic back pain   . PONV (postoperative nausea and vomiting)   . Chronic a-fib (HCC)   . A-fib (HCC)   . PNA (pneumonia)    Medications:  Scheduled:  . atorvastatin  10 mg Oral Daily  . B-complex with vitamin C  1 tablet Oral Daily  . brimonidine  1 drop Both Eyes TID  . budesonide  0.5 mg Nebulization BID  . diltiazem  360 mg Oral Daily  . furosemide  40 mg Intravenous BID  . labetalol  200 mg Oral BID  . latanoprost  1 drop Both Eyes QHS  . levothyroxine  100 mcg Oral QAC breakfast  . pantoprazole  40 mg Oral Daily  . potassium chloride SA  20 mEq Oral Daily  . sildenafil  20 mg  Oral TID  . timolol  1 drop Both Eyes q morning - 10a  . Warfarin - Pharmacist Dosing Inpatient   Does not apply q1800   Assessment: [age] yoF on warfrin PTA for afib, presented to the ED on 5/18 with c/o SOB.  CXR showed "enlarging" left-sided and small right-sided pleural effusions. Warfarin was resumed on admission. S/p thoracentesis on 5/19 with INR 2.19.  Per Brayton ElKevin Bruning (IR PA), ok to resume warfarin back s/p procedure on 5/19.  Home warfarin regimen:  2mg  daily, exc 2.5mg  M,F with LD 5/17 at 1900  Today, 08/11/2015: - INR is therapeutic at 2.19 - s/p thoracentesis on 5/19 with 280 mL fluid removed - cbc stable - no significant drug-drug intxns  Goal of Therapy:  INR 2-3   Plan:   Warfarin 2.5mg  x1 today  Daily PT/INR  Monitor CBC, sign/sx of bleed  Dorna LeitzAnh Edwina Grossberg, PharmD, BCPS 08/11/2015 10:53 AM

## 2015-08-11 NOTE — Procedures (Signed)
Small left pleural effusion Successful US guided left thoracentesis. Yielded 280mL of clear yellow fluid. Pt tolerated procedure well. No immediate complications.  Specimen was sent for labs. CXR ordered.  Brayton ElBRUNING, Hubert Derstine PA-C 08/11/2015 10:46 AM

## 2015-08-11 NOTE — Progress Notes (Signed)
PROGRESS NOTE  Driscilla MoatsMamie N Niehaus GNF:[id number] DOB: 07/27/1919 DOA: 08/10/2015 PCP: Lorenda PeckOBERTS, RONALD WAYNE, MD  HPI/Recap of past 24 hours:  Reported feeling better after thoracentesis, less lower extremity edema, denies pain Remain in afib, heart rate better  Assessment/Plan: Active Problems:   Atrial fibrillation (HCC)   Dyspnea   Pleural effusion   Anasarca  Sob with pleural effusion/bilateral lower extremity edema with h/o chronic afib and diastolic chf, bnp elevated compare to baseline, possible acute on chronic diastolic chf, increase lasix to 40mg  bid, cardiology following  Patient  nontoxic appearing, no leukocytosis, no fever, will hold off on abx, s/p thoracentesis on 5/19 with clear yellow fluids, total protein less than 3, ldh ration less than 3/2, gram stain no organism seen, culture pending, fluids likely transudative.  Afib/rvr: continue home meds cardizem/labetalol/coumadin , cardiology consulted  Hypothyroidism: Continue synthroid  DVT prophylaxis: on coumadin  Consultants: cardiology   Code Status: DNR  Family Communication: Patient   Disposition Plan:pending   Procedures:  Thoracentesis on 5/19  Antibiotics:  Vanc/aztreonam x1 in the ED   Objective: BP 125/57 mmHg  Pulse 73  Temp(Src) 98.1 F (36.7 C) (Oral)  Resp 17  Ht 5\' 4"  (1.626 m)  Wt 67.813 kg (149 lb 8 oz)  BMI 25.65 kg/m2  SpO2 93%  Intake/Output Summary (Last 24 hours) at 08/11/15 2141 Last data filed at 08/11/15 1439  Gross per 24 hour  Intake    480 ml  Output   2251 ml  Net  -1771 ml   Filed Weights   08/10/15 1538 08/11/15 0520  Weight: 70.308 kg (155 lb) 67.813 kg (149 lb 8 oz)    Exam:  General: Appear younger than stated age  Eyes: PERRL  ENT: unremarkable  Neck: supple, no JVD  Cardiovascular: IRRR  Respiratory: diminished at basis, no wheezing, no rhonchi  Abdomen: soft/ND/ND, positive bowel sounds  Skin: no rash  Musculoskeletal:  3+pitting edema bilateral lower extremity  Psychiatric: calm/cooperative  Neurologic: no focal findings    Data Reviewed: Basic Metabolic Panel:  Recent Labs Lab 08/10/15 1348 08/11/15 0504  NA 143 143  K 4.0 3.5  CL 108 108  CO2 27 28  GLUCOSE 100* 94  BUN 20 20  CREATININE 1.32* 1.28*  CALCIUM 9.3 8.7*  MG  --  1.8   Liver Function Tests:  Recent Labs Lab 08/10/15 1348 08/11/15 0504  AST 17 17  ALT 18 16  ALKPHOS 68 60  BILITOT 0.9 0.6  PROT 6.8 5.9*  ALBUMIN 3.6 3.2*   No results for input(s): LIPASE, AMYLASE in the last 168 hours. No results for input(s): AMMONIA in the last 168 hours. CBC:  Recent Labs Lab 08/10/15 1348 08/11/15 0504  WBC 8.3 9.3  NEUTROABS 6.1 7.1  HGB 11.2* 10.5*  HCT 35.8* 33.2*  MCV 87.3 85.3  PLT 281 255   Cardiac Enzymes:   No results for input(s): CKTOTAL, CKMB, CKMBINDEX, TROPONINI in the last 168 hours. BNP (last 3 results)  Recent Labs  06/03/15 1611 06/14/15 0813 08/10/15 1348  BNP 358.1* 365.3* 533.1*    ProBNP (last 3 results) No results for input(s): PROBNP in the last 8760 hours.  CBG: No results for input(s): GLUCAP in the last 168 hours.  Recent Results (from the past 240 hour(s))  MRSA PCR Screening     Status: None   Collection Time: 08/10/15  6:47 PM  Result Value Ref Range Status   MRSA by PCR NEGATIVE NEGATIVE Final  Comment:        The GeneXpert MRSA Assay (FDA approved for NASAL specimens only), is one component of a comprehensive MRSA colonization surveillance program. It is not intended to diagnose MRSA infection nor to guide or monitor treatment for MRSA infections.   Gram stain     Status: None   Collection Time: 08/11/15  2:11 PM  Result Value Ref Range Status   Specimen Description FLUID PLEURAL  Final   Special Requests NONE  Final   Gram Stain   Final    MODERATE WBC PRESENT, PREDOMINANTLY MONONUCLEAR NO ORGANISMS SEEN Performed at [facility]    Report  Status 08/11/2015 FINAL  Final     Studies: Dg Chest 1 View  08/11/2015  CLINICAL DATA:  Status post left thoracentesis. EXAM: CHEST 1 VIEW COMPARISON:  08/10/2015 FINDINGS: The cardiac silhouette is slightly enlarged. Thoracic aortic atherosclerosis is noted. There is a small left pleural effusion, decreased in size following interval thoracentesis. No pneumothorax is identified. No sizable right-sided pleural effusion is identified. Mild bibasilar opacities likely reflect atelectasis, with improved aeration of the left lung base. IMPRESSION: 1. Small residual left pleural effusion following interval thoracentesis. No pneumothorax. 2. Mild bibasilar atelectasis. Electronically Signed   By: Sebastian Ache M.D.   On: 08/11/2015 11:28   US Thoracentesis Asp Pleural Space W/img Guide  08/11/2015  INDICATION: Shortness of breath. Left-sided pleural effusion. Request for diagnostic and therapeutic thoracentesis. EXAM: ULTRASOUND GUIDED LEFT THORACENTESIS MEDICATIONS: None. COMPLICATIONS: None immediate. PROCEDURE: An ultrasound guided thoracentesis was thoroughly discussed with the patient and questions answered. The benefits, risks, alternatives and complications were also discussed. The patient understands and wishes to proceed with the procedure. Written consent was obtained. Ultrasound was performed to localize and mark an adequate pocket of fluid in the left chest. The area was then prepped and draped in the normal sterile fashion. 1% Lidocaine was used for local anesthesia. Under ultrasound guidance a 280 mL catheter was introduced. Thoracentesis was performed. The catheter was removed and a dressing applied. FINDINGS: A total of approximately 280 mL of clear yellow fluid was removed. Samples were submitted to the lab as requested. IMPRESSION: Successful ultrasound guided left thoracentesis yielding 280 mL of pleural fluid. Read by: Brayton El PA-C Electronically Signed   By: Gilmer Mor D.O.   On:  08/11/2015 11:12    Scheduled Meds: . atorvastatin  10 mg Oral Daily  . B-complex with vitamin C  1 tablet Oral Daily  . brimonidine  1 drop Both Eyes TID  . budesonide  0.5 mg Nebulization BID  . diltiazem  360 mg Oral Daily  . furosemide  40 mg Intravenous BID  . labetalol  200 mg Oral BID  . latanoprost  1 drop Both Eyes QHS  . levothyroxine  100 mcg Oral QAC breakfast  . pantoprazole  40 mg Oral Daily  . potassium chloride SA  20 mEq Oral Daily  . sildenafil  20 mg Oral TID  . timolol  1 drop Both Eyes q morning - 10a  . Warfarin - Pharmacist Dosing Inpatient   Does not apply q1800    Continuous Infusions:    Time spent:  Ellajane Stong MD, PhD  [facility] Pager [phone]. If 7PM-7AM, please contact night-coverage at [email or web address], password [facility] 08/11/2015, 9:41 PM  LOS: 0 days

## 2015-08-11 NOTE — Progress Notes (Signed)
Patient Name: Tonya Farley Date of Encounter: 08/11/2015     Active Problems:   Atrial fibrillation (HCC)   Dyspnea   Pleural effusion   Anasarca    SUBJECTIVE  No complaints- still feels weak and jittery this AM. Confused bc someone told her she was going home today.   CURRENT MEDS . atorvastatin  10 mg Oral Daily  . B-complex with vitamin C  1 tablet Oral Daily  . brimonidine  1 drop Both Eyes TID  . budesonide  0.5 mg Nebulization BID  . diltiazem  360 mg Oral Daily  . furosemide  40 mg Intravenous BID  . labetalol  200 mg Oral BID  . latanoprost  1 drop Both Eyes QHS  . levothyroxine  100 mcg Oral QAC breakfast  . pantoprazole  40 mg Oral Daily  . potassium chloride SA  20 mEq Oral Daily  . timolol  1 drop Both Eyes q morning - 10a  . Warfarin - Pharmacist Dosing Inpatient   Does not apply q1800    OBJECTIVE  Filed Vitals:   08/10/15 1900 08/10/15 2109 08/10/15 2137 08/11/15 0520  BP:   148/62 139/67  Pulse:  108 102 87  Temp:   97.6 F (36.4 C) 97.5 F (36.4 C)  TempSrc:   Oral Oral  Resp:  18 20 20   Height: 5\' 4"  (1.626 m)     Weight:    149 lb 8 oz (67.813 kg)  SpO2:  94% 95% 98%    Intake/Output Summary (Last 24 hours) at 08/11/15 0802 Last data filed at 08/11/15 [id number]  Gross per 24 hour  Intake    240 ml  Output   2000 ml  Net  -1760 ml   Filed Weights   08/10/15 1538 08/11/15 0520  Weight: 155 lb (70.308 kg) 149 lb 8 oz (67.813 kg)    PHYSICAL EXAM  General: Pleasant, NAD. Elderly and frail Neuro: Alert and oriented X 3. Moves all extremities spontaneously. Psych: Normal affect. HEENT:  Normal  Neck: Supple without bruits or JVD. Lungs:  Resp regular and unlabored, Lungs decreased BS left base  Heart: S1/S2 SEM murmur, no rub, gallop or click. Abdomen: Soft, non-tender, non-distended, BS + x 4.  Extremities: No clubbing, cyanosis. 2+LE edema. DP/PT/Radials 2+ and equal bilaterally.  Accessory Clinical Findings  CBC  Recent  Labs  08/10/15 1348 08/11/15 0504  WBC 8.3 9.3  NEUTROABS 6.1 7.1  HGB 11.2* 10.5*  HCT 35.8* 33.2*  MCV 87.3 85.3  PLT 281 255   Basic Metabolic Panel  Recent Labs  08/10/15 1348 08/11/15 0504  NA 143 143  K 4.0 3.5  CL 108 108  CO2 27 28  GLUCOSE 100* 94  BUN 20 20  CREATININE 1.32* 1.28*  CALCIUM 9.3 8.7*  MG  --  1.8   Liver Function Tests  Recent Labs  08/10/15 1348 08/11/15 0504  AST 17 17  ALT 18 16  ALKPHOS 68 60  BILITOT 0.9 0.6  PROT 6.8 5.9*  ALBUMIN 3.6 3.2*    TELE  afib with HR in 90s  Radiology/Studies  Dg Chest 2 View  08/10/2015  CLINICAL DATA:  82 year old female increased blood pressure EXAM: CHEST  2 VIEW COMPARISON:  06/17/2015, 06/13/2015, chest CT 06/12/2015 FINDINGS: Cardiomediastinal silhouette likely unchanged, partially obscured by overlying lung/pleural disease. Increasing opacity at the left base, with blunting of the left costophrenic angle, obscuration of the left hemidiaphragm and retrocardiac region. Opacity in the costophrenic sulcus  on the lateral view with meniscus present. Coarsened interstitial markings bilaterally. No interlobular septal thickening. Likely small right-sided pleural effusion. Stay linear opacities at the hilar regions, similar to comparison. No pneumothorax. Treated lower thoracic compression fracture again noted. Treated lumbar compression fracture noted. Atherosclerosis. IMPRESSION: Enlarging left-sided pleural effusion, with associated atelectasis/ consolidation. No evidence of overt edema. Small right-sided pleural effusion. Signed, Yvone Neu. Loreta Ave, DO [facility] Electronically Signed   By: Gilmer Mor D.O.   On: 08/10/2015 11:09    ASSESSMENT AND PLAN  Tonya Farley is a 82 y.o. female with a history of renal artery stenosis, HTN, chronic Afib on coumadin, and hypothyroidism who presented to [facility] ED 08/10/15 with tachycardia and SOB.    Dyspnea: She has had recurrent pleural effusion post pneumonia and influenza. Not clear that this is cardiac related. IV Lasix  daily increased to bid yesterday. Net neg 1.76 L. Will order CXR left lateral decubitus and recommend radiology proceed with left thoracentesis per Dr. Eden Emms. Echo shows normal LV function but some pulmonary hypertension. Plan was to add vasodilator (revatio) for pre load reduction. I don't see that this order was placed so will add Revatio  TID now.  Afib: Chronic on coumadin. INR therapeutic. Continue cardizem and labetalol increased to 300  bid. HR with better control in 90s.  Murmur: Mild to moderate AS on last echo no need to repeat at this time   Thyroid: Continue synthroid replacement    Signed, Cline Crock PA-C  Pager [phone]  Good diuresis rate control better with higher dose labetalol.  For LL decubitus and likely left sided Thoracentesis today. Exam with decreased BS left base. And AS murmur   US guided procedure should be ok  With Rx coumadin  Charlton Haws

## 2015-08-12 DIAGNOSIS — J90 Pleural effusion, not elsewhere classified: Secondary | ICD-10-CM | POA: Insufficient documentation

## 2015-08-12 DIAGNOSIS — I5033 Acute on chronic diastolic (congestive) heart failure: Secondary | ICD-10-CM

## 2015-08-12 DIAGNOSIS — J948 Other specified pleural conditions: Secondary | ICD-10-CM

## 2015-08-12 LAB — CBC
HCT: 31.9 % — ABNORMAL LOW (ref 36.0–46.0)
HEMOGLOBIN: 10.4 g/dL — AB (ref 12.0–15.0)
MCH: 27.8 pg (ref 26.0–34.0)
MCHC: 32.6 g/dL (ref 30.0–36.0)
MCV: 85.3 fL (ref 78.0–100.0)
PLATELETS: 257 10*3/uL (ref 150–400)
RBC: 3.74 MIL/uL — ABNORMAL LOW (ref 3.87–5.11)
RDW: 18.2 % — ABNORMAL HIGH (ref 11.5–15.5)
WBC: 8.1 10*3/uL (ref 4.0–10.5)

## 2015-08-12 LAB — URINALYSIS, ROUTINE W REFLEX MICROSCOPIC
BILIRUBIN URINE: NEGATIVE
GLUCOSE, UA: NEGATIVE mg/dL
HGB URINE DIPSTICK: NEGATIVE
KETONES UR: NEGATIVE mg/dL
LEUKOCYTES UA: NEGATIVE
Nitrite: NEGATIVE
PH: 6.5 (ref 5.0–8.0)
PROTEIN: NEGATIVE mg/dL
Specific Gravity, Urine: 1.01 (ref 1.005–1.030)

## 2015-08-12 LAB — BASIC METABOLIC PANEL
Anion gap: 8 (ref 5–15)
BUN: 21 mg/dL — AB (ref 6–20)
CALCIUM: 8.9 mg/dL (ref 8.9–10.3)
CHLORIDE: 106 mmol/L (ref 101–111)
CO2: 28 mmol/L (ref 22–32)
CREATININE: 1.55 mg/dL — AB (ref 0.44–1.00)
GFR, EST AFRICAN AMERICAN: 32 mL/min — AB (ref >60)
GFR, EST NON AFRICAN AMERICAN: 28 mL/min — AB (ref >60)
Glucose, Bld: 98 mg/dL (ref 65–99)
Potassium: 4.1 mmol/L (ref 3.5–5.1)
SODIUM: 142 mmol/L (ref 135–145)

## 2015-08-12 LAB — PROTIME-INR
INR: 2.4 — AB (ref 0.00–1.49)
PROTHROMBIN TIME: 25.1 s — AB (ref 11.6–15.2)

## 2015-08-12 LAB — BRAIN NATRIURETIC PEPTIDE: B NATRIURETIC PEPTIDE 5: 201.3 pg/mL — AB (ref 0.0–100.0)

## 2015-08-12 LAB — MAGNESIUM: MAGNESIUM: 1.8 mg/dL (ref 1.7–2.4)

## 2015-08-12 MED ORDER — WARFARIN SODIUM 2 MG PO TABS
2.0000 mg | ORAL_TABLET | Freq: Once | ORAL | Status: AC
  Administered 2015-08-12: 2 mg via ORAL
  Filled 2015-08-12: qty 1

## 2015-08-12 NOTE — Progress Notes (Signed)
ANTICOAGULATION CONSULT NOTE - Follow-Up  Pharmacy Consult for Warfarin Indication: atrial fibrillation  Allergies  Allergen Reactions  . Penicillins Hives and Swelling    Has patient had a PCN reaction causing immediate rash, facial/tongue/throat swelling, SOB or lightheadedness with hypotension: Yes Has patient had a PCN reaction causing severe rash involving mucus membranes or skin necrosis: Yes Has patient had a PCN reaction that required hospitalization No Has patient had a PCN reaction occurring within the last 10 years: No If all of the above answers are "NO", then may proceed with Cephalosporin use.    . Sulfa Antibiotics Anaphylaxis and Swelling   Patient Measurements: Height: 5\' 4"  (162.6 cm) Weight: 146 lb 8 oz (66.452 kg) IBW/kg (Calculated) : 54.7  Vital Signs: Temp: 98 F (36.7 C) (05/20 0540) Temp Source: Oral (05/20 0540) BP: 143/52 mmHg (05/20 1110) Pulse Rate: 68 (05/20 1110)  Labs:  Recent Labs  08/10/15 1348 08/11/15 0504 08/12/15 0525  HGB 11.2* 10.5* 10.4*  HCT 35.8* 33.2* 31.9*  PLT 281 255 257  LABPROT 22.4* 23.4* 25.1*  INR 2.07* 2.19* 2.40*  CREATININE 1.32* 1.28* 1.55*   Estimated Creatinine Clearance: 21.3 mL/min (by C-G formula based on Cr of 1.55).  Medical History: Past Medical History  Diagnosis Date  . Hypertension   . Hyperlipidemia   . Thyroid disease   . Hypothyroidism   . Chronic back pain   . PONV (postoperative nausea and vomiting)   . Chronic a-fib (HCC)   . A-fib (HCC)   . PNA (pneumonia)    Medications:  Scheduled:  . atorvastatin  10 mg Oral Daily  . B-complex with vitamin C  1 tablet Oral Daily  . brimonidine  1 drop Both Eyes TID  . budesonide  0.5 mg Nebulization BID  . diltiazem  360 mg Oral Daily  . furosemide  40 mg Intravenous BID  . labetalol  200 mg Oral BID  . latanoprost  1 drop Both Eyes QHS  . levothyroxine  100 mcg Oral QAC breakfast  . pantoprazole  40 mg Oral Daily  . potassium chloride SA   20 mEq Oral Daily  . sildenafil  20 mg Oral TID  . timolol  1 drop Both Eyes q morning - 10a  . Warfarin - Pharmacist Dosing Inpatient   Does not apply q1800   Assessment: [age] yoF on warfrin PTA for afib, presented to the ED on 5/18 with c/o SOB.  CXR showed "enlarging" left-sided and small right-sided pleural effusions. Warfarin was resumed on admission. S/p thoracentesis on 5/19 with INR 2.19.  Per Brayton ElKevin Bruning (IR PA), ok to resume warfarin back s/p procedure on 5/19.  Home warfarin regimen:  2mg  daily, exc 2.5mg  M,F with LD 5/17 at 1900  Today, 08/12/2015: - INR is therapeutic at 2.40 - Hgb, Plt stable - s/p thoracentesis on 5/19 with 280 mL fluid removed - cbc stable - no significant drug-drug intxns  Goal of Therapy:  INR 2-3   Plan:   Warfarin 2 mg x1 today  Daily PT/INR  Monitor CBC, sign/sx of bleed  Adalberto ColeNikola Vaishnav Demartin, PharmD, BCPS Pager [phone] 08/12/2015 11:55 AM

## 2015-08-12 NOTE — Progress Notes (Addendum)
PROGRESS NOTE  Tonya Farley ZOX:[id number] DOB: [birth year] DOA: 08/10/2015 PCP: Lorenda Peck, MD  HPI/Recap of past 24 hours:  S/p thoracentesis on 5/19,  Feeling better, no sob at rest, less lower extremity edema, denies pain Remain in afib, heart rate better  Assessment/Plan: Active Problems:   Atrial fibrillation (HCC)   Dyspnea   Pleural effusion   Anasarca   Peripheral edema   Diastolic CHF, acute on chronic (HCC)   Hypokalemia  Sob with pleural effusion/bilateral lower extremity edema with h/o chronic afib and diastolic chf, bnp elevated compare to baseline, possible acute on chronic diastolic chf, increase lasix to  bid, cardiology following  Patient  nontoxic appearing, no leukocytosis, no fever, will hold off on abx, s/p thoracentesis on 5/19 with clear yellow fluids, total protein less than 3, ldh ration less than 3/2, gram stain no organism seen, culture pending, fluids likely transudative.  Afib/rvr: continue home meds cardizem/labetalol/coumadin , cardiology consulted   CKD III: monitor cr with diuresis, cr 1.32-1.28-1.55  Hypothyroidism: Continue synthroid  DVT prophylaxis: on coumadin  Consultants: cardiology   Code Status: DNR  Family Communication: Patient   Disposition Plan:pending, home health vs SNF, anticipate discharge on 5/21   Procedures:  Thoracentesis on 5/19  Antibiotics:  Vanc/aztreonam x1 in the ED   Objective: BP 120/58 mmHg  Pulse 79  Temp(Src) 98 F (36.7 C) (Oral)  Resp 17  Ht  (1.626 m)  Wt 66.452 kg (146 lb 8 oz)  BMI 25.13 kg/m2  SpO2 96%  Intake/Output Summary (Last 24 hours) at 08/12/15 8119 Last data filed at 08/12/15 0908  Gross per 24 hour  Intake    240 ml  Output   1551 ml  Net  -1311 ml   Filed Weights   08/10/15 1538 08/11/15 0520 08/12/15 0540  Weight: 70.308 kg (155 lb) 67.813 kg (149 lb 8 oz) 66.452 kg (146 lb 8 oz)    Exam:  General: Appear younger than stated  age  Eyes: PERRL  ENT: unremarkable  Neck: supple, no JVD  Cardiovascular: IRRR  Respiratory: diminished at basis, no wheezing, no rhonchi  Abdomen: soft/ND/ND, positive bowel sounds  Skin: no rash  Musculoskeletal: 3+pitting edema bilateral lower extremity  Psychiatric: calm/cooperative  Neurologic: no focal findings    Data Reviewed: Basic Metabolic Panel:  Recent Labs Lab 08/10/15 1348 08/11/15 0504 08/12/15 0525  NA 143 143 142  K 4.0 3.5 4.1  CL 108 108 106  CO2 GLUCOSE 100* 94 98  BUN 20 20 21*  CREATININE 1.32* 1.28* 1.55*  CALCIUM 9.3 8.7* 8.9  MG  --  1.8 1.8   Liver Function Tests:  Recent Labs Lab 08/10/15 1348 08/11/15 0504  AST 17 17  ALT 18 16  ALKPHOS 68 60  BILITOT 0.9 0.6  PROT 6.8 5.9*  ALBUMIN 3.6 3.2*   No results for input(s): LIPASE, AMYLASE in the last 168 hours. No results for input(s): AMMONIA in the last 168 hours. CBC:  Recent Labs Lab 08/10/15 1348 08/11/15 0504 08/12/15 0525  WBC 8.3 9.3 8.1  NEUTROABS 6.1 7.1  --   HGB 11.2* 10.5* 10.4*  HCT 35.8* 33.2* 31.9*  MCV 87.3 85.3 85.3  PLT 281 255 257   Cardiac Enzymes:   No results for input(s): CKTOTAL, CKMB, CKMBINDEX, TROPONINI in the last 168 hours. BNP (last 3 results)  Recent Labs  06/14/15 0813 08/10/15 1348 08/12/15 0525  BNP 365.3* 533.1* 201.3*  ProBNP (last 3 results) No results for input(s): PROBNP in the last 8760 hours.  CBG: No results for input(s): GLUCAP in the last 168 hours.  Recent Results (from the past 240 hour(s))  MRSA PCR Screening     Status: None   Collection Time: 08/10/15  6:47 PM  Result Value Ref Range Status   MRSA by PCR NEGATIVE NEGATIVE Final    Comment:        The GeneXpert MRSA Assay (FDA approved for NASAL specimens only), is one component of a comprehensive MRSA colonization surveillance program. It is not intended to diagnose MRSA infection nor to guide or monitor treatment for MRSA  infections.   Gram stain     Status: None   Collection Time: 08/11/15  2:11 PM  Result Value Ref Range Status   Specimen Description FLUID PLEURAL  Final   Special Requests NONE  Final   Gram Stain   Final    MODERATE WBC PRESENT, PREDOMINANTLY MONONUCLEAR NO ORGANISMS SEEN Performed at [facility]    Report Status 08/11/2015 FINAL  Final     Studies: Dg Chest 1 View  08/11/2015  CLINICAL DATA:  Status post left thoracentesis. EXAM: CHEST 1 VIEW COMPARISON:  08/10/2015 FINDINGS: The cardiac silhouette is slightly enlarged. Thoracic aortic atherosclerosis is noted. There is a small left pleural effusion, decreased in size following interval thoracentesis. No pneumothorax is identified. No sizable right-sided pleural effusion is identified. Mild bibasilar opacities likely reflect atelectasis, with improved aeration of the left lung base. IMPRESSION: 1. Small residual left pleural effusion following interval thoracentesis. No pneumothorax. 2. Mild bibasilar atelectasis. Electronically Signed   By: Sebastian Ache M.D.   On: 08/11/2015 11:28   US Thoracentesis Asp Pleural Space W/img Guide  08/11/2015  INDICATION: Shortness of breath. Left-sided pleural effusion. Request for diagnostic and therapeutic thoracentesis. EXAM: ULTRASOUND GUIDED LEFT THORACENTESIS MEDICATIONS: None. COMPLICATIONS: None immediate. PROCEDURE: An ultrasound guided thoracentesis was thoroughly discussed with the patient and questions answered. The benefits, risks, alternatives and complications were also discussed. The patient understands and wishes to proceed with the procedure. Written consent was obtained. Ultrasound was performed to localize and mark an adequate pocket of fluid in the left chest. The area was then prepped and draped in the normal sterile fashion. 1% Lidocaine was used for local anesthesia. Under ultrasound guidance a 280 mL catheter was introduced. Thoracentesis was performed. The catheter was removed  and a dressing applied. FINDINGS: A total of approximately 280 mL of clear yellow fluid was removed. Samples were submitted to the lab as requested. IMPRESSION: Successful ultrasound guided left thoracentesis yielding 280 mL of pleural fluid. Read by: Brayton El PA-C Electronically Signed   By: Gilmer Mor D.O.   On: 08/11/2015 11:12    Scheduled Meds: . atorvastatin  10 mg Oral Daily  . B-complex with vitamin C  1 tablet Oral Daily  . brimonidine  1 drop Both Eyes TID  . budesonide  0.5 mg Nebulization BID  . diltiazem  360 mg Oral Daily  . furosemide  40 mg Intravenous BID  . labetalol  200 mg Oral BID  . latanoprost  1 drop Both Eyes QHS  . levothyroxine  100 mcg Oral QAC breakfast  . pantoprazole  40 mg Oral Daily  . potassium chloride SA  20 mEq Oral Daily  . sildenafil  20 mg Oral TID  . timolol  1 drop Both Eyes q morning - 10a  . Warfarin - Pharmacist Dosing Inpatient  Does not apply q1800    Continuous Infusions:    Time spent: 25mins  Pius Byrom MD, PhD  [facility] Pager [phone]. If 7PM-7AM, please contact night-coverage at [email or web address], password [email or web address] 08/12/2015, 9:23 AM  LOS: 1 day

## 2015-08-12 NOTE — Evaluation (Addendum)
Physical Therapy Evaluation Patient Details Name: Tonya Farley MRN: [id number] DOB: [birth year] Today's Date: 08/12/2015   History of Present Illness  82 y.o. female with recent admission for flu and  PNA with DC to [place], then home. Now admitted with anasarca. s/p thoracentesis 08/11/15. H/O compression fx with VP 3 yrs ago, a fib, HTN.   Clinical Impression  Pt admitted with above diagnosis. Pt currently with functional limitations due to the deficits listed below (see PT Problem List). Pt ambulated 56' with RW with supervision, no loss of balance. HHPT recommended. Pt will benefit from skilled PT to increase their independence and safety with mobility to allow discharge to the venue listed below.       Follow Up Recommendations Home health PT    Equipment Recommendations  None recommended by PT    Recommendations for Other Services       Precautions / Restrictions Precautions Precautions: Fall Precaution Comments: pt denies h/o falls in past year Restrictions Weight Bearing Restrictions: No      Mobility  Bed Mobility               General bed mobility comments: NT- up in recliner  Transfers Overall transfer level: Needs assistance Equipment used: Rolling walker (2 wheeled) Transfers: Sit to/from Stand Sit to Stand: Supervision         General transfer comment: verbal cues hand placement  Ambulation/Gait Ambulation/Gait assistance: Supervision Ambulation Distance (Feet): 90 Feet Assistive device: Rolling walker (2 wheeled) Gait Pattern/deviations: Step-through pattern;Decreased stride length   Gait velocity interpretation: at or above normal speed for age/gender General Gait Details: steady with RW, no LOB, 1/4 dyspnea (no dynamap available to check SaO2)  Stairs            Wheelchair Mobility    Modified Rankin (Stroke Patients Only)       Balance Overall balance assessment: Modified Independent                                            Pertinent Vitals/Pain Pain Assessment: No/denies pain    Home Living Family/patient expects to be discharged to:: Private residence Living Arrangements: Alone Available Help at Discharge: Family;Personal care attendant;Available PRN/intermittently Type of Home: House Home Access: Stairs to enter Entrance Stairs-Rails: Can reach both;Left;Right Entrance Stairs-Number of Steps: 3 Home Layout: One level Home Equipment: Walker - 2 wheels;Cane - single point;Tub bench;Grab bars - tub/shower Additional Comments: Pt still drives.     Prior Function Level of Independence: Needs assistance   Gait / Transfers Assistance Needed: mod I with RW  ADL's / Homemaking Assistance Needed: for past 2 weeks has had an aide for bathing/dressing        Hand Dominance        Extremity/Trunk Assessment   Upper Extremity Assessment: Overall WFL for tasks assessed  Mild pitting edema noted B ankles           Lower Extremity Assessment: Overall WFL for tasks assessed      Cervical / Trunk Assessment: Kyphotic  Communication   Communication: HOH  Cognition Arousal/Alertness: Awake/alert Behavior During Therapy: WFL for tasks assessed/performed Overall Cognitive Status: Within Functional Limits for tasks assessed                      General Comments      Exercises  Assessment/Plan    PT Assessment Patient needs continued PT services  PT Diagnosis Generalized weakness   PT Problem List Decreased activity tolerance  PT Treatment Interventions Gait training;Stair training;Therapeutic exercise   PT Goals (Current goals can be found in the Care Plan section) Acute Rehab PT Goals Patient Stated Goal: to get my strength back PT Goal Formulation: With patient Time For Goal Achievement: 08/26/15 Potential to Achieve Goals: Good    Frequency Min 3X/week   Barriers to discharge        Co-evaluation               End of Session  Equipment Utilized During Treatment: Gait belt Activity Tolerance: Patient tolerated treatment well Patient left: in chair;with call bell/phone within reach Nurse Communication: Mobility status         Time: [id number] PT Time Calculation (min) (ACUTE ONLY): 20 min   Charges:   PT Evaluation $PT Eval Low Complexity: 1 Procedure     PT G CodesTamala Ser:        Darron Stuck Kistler 08/12/2015, 10:39 AM [phone]

## 2015-08-12 NOTE — Progress Notes (Signed)
Patient ID: Tonya Farley, female   DOB: 1919-06-18, 82 y.o.   MRN: [id number]   Patient Name: Tonya Farley Date of Encounter: 08/12/2015     Active Problems:   Atrial fibrillation (HCC)   Dyspnea   Pleural effusion   Anasarca   Peripheral edema   Diastolic CHF, acute on chronic (HCC)   Hypokalemia    SUBJECTIVE  She thinks she is doing fine    CURRENT MEDS . atorvastatin  10 mg Oral Daily  . B-complex with vitamin C  1 tablet Oral Daily  . brimonidine  1 drop Both Eyes TID  . budesonide  0.5 mg Nebulization BID  . diltiazem  360 mg Oral Daily  . furosemide  40 mg Intravenous BID  . labetalol  200 mg Oral BID  . latanoprost  1 drop Both Eyes QHS  . levothyroxine  100 mcg Oral QAC breakfast  . pantoprazole  40 mg Oral Daily  . potassium chloride SA  20 mEq Oral Daily  . sildenafil  20 mg Oral TID  . timolol  1 drop Both Eyes q morning - 10a  . Warfarin - Pharmacist Dosing Inpatient   Does not apply q1800    OBJECTIVE  Filed Vitals:   08/11/15 1437 08/11/15 2030 08/11/15 2120 08/12/15 0540  BP: 134/79  125/57 120/58  Pulse: 94 71 73 79  Temp: 98.1 F (36.7 C)  98.1 F (36.7 C) 98 F (36.7 C)  TempSrc: Oral  Oral Oral  Resp: 19 18 17 17   Height:      Weight:    66.452 kg (146 lb 8 oz)  SpO2: 95% 93% 93% 96%    Intake/Output Summary (Last 24 hours) at 08/12/15 0840 Last data filed at 08/11/15 1439  Gross per 24 hour  Intake    240 ml  Output   1351 ml  Net  -1111 ml   Filed Weights   08/10/15 1538 08/11/15 0520 08/12/15 0540  Weight: 70.308 kg (155 lb) 67.813 kg (149 lb 8 oz) 66.452 kg (146 lb 8 oz)    PHYSICAL EXAM  General: Pleasant, NAD. Elderly and frail Neuro: Alert and oriented X 3. Moves all extremities spontaneously. Psych: Normal affect. HEENT:  Normal  Neck: Supple without bruits or JVD. Lungs:  Resp regular and unlabored, bilateral atelectasis and crackles base  Heart: S1/S2 SEM murmur, no rub, gallop or click. Abdomen: Soft,  non-tender, non-distended, BS + x 4.  Extremities: No clubbing, cyanosis. 2+LE edema. DP/PT/Radials 2+ and equal bilaterally.  Accessory Clinical Findings  CBC  Recent Labs  08/10/15 1348 08/11/15 0504 08/12/15 0525  WBC 8.3 9.3 8.1  NEUTROABS 6.1 7.1  --   HGB 11.2* 10.5* 10.4*  HCT 35.8* 33.2* 31.9*  MCV 87.3 85.3 85.3  PLT 281 255 257   Basic Metabolic Panel  Recent Labs  08/11/15 0504 08/12/15 0525  NA 143 142  K 3.5 4.1  CL 108 106  CO2 28 28  GLUCOSE 94 98  BUN 20 21*  CREATININE 1.28* 1.55*  CALCIUM 8.7* 8.9  MG 1.8 1.8   Liver Function Tests  Recent Labs  08/10/15 1348 08/11/15 0504  AST 17 17  ALT 18 16  ALKPHOS 68 60  BILITOT 0.9 0.6  PROT 6.8 5.9*  ALBUMIN 3.6 3.2*    TELE  afib with HR in 90s  Radiology/Studies  Dg Chest 1 View  08/11/2015  CLINICAL DATA:  Status post left thoracentesis. EXAM: CHEST 1 VIEW COMPARISON:  08/10/2015 FINDINGS: The cardiac silhouette is slightly enlarged. Thoracic aortic atherosclerosis is noted. There is a small left pleural effusion, decreased in size following interval thoracentesis. No pneumothorax is identified. No sizable right-sided pleural effusion is identified. Mild bibasilar opacities likely reflect atelectasis, with improved aeration of the left lung base. IMPRESSION: 1. Small residual left pleural effusion following interval thoracentesis. No pneumothorax. 2. Mild bibasilar atelectasis. Electronically Signed   By: Sebastian AcheAllen  Grady M.D.   On: 08/11/2015 11:28   Dg Chest 2 View  08/10/2015  CLINICAL DATA:  82 year old female increased blood pressure EXAM: CHEST  2 VIEW COMPARISON:  06/17/2015, 06/13/2015, chest CT 06/12/2015 FINDINGS: Cardiomediastinal silhouette likely unchanged, partially obscured by overlying lung/pleural disease. Increasing opacity at the left base, with blunting of the left costophrenic angle, obscuration of the left hemidiaphragm and retrocardiac region. Opacity in the costophrenic sulcus  on the lateral view with meniscus present. Coarsened interstitial markings bilaterally. No interlobular septal thickening. Likely small right-sided pleural effusion. Stay linear opacities at the hilar regions, similar to comparison. No pneumothorax. Treated lower thoracic compression fracture again noted. Treated lumbar compression fracture noted. Atherosclerosis. IMPRESSION: Enlarging left-sided pleural effusion, with associated atelectasis/ consolidation. No evidence of overt edema. Small right-sided pleural effusion. Signed, Yvone NeuJaime S. Loreta AveWagner, DO [facility] Electronically Signed   By: Gilmer MorJaime  Wagner D.O.   On: 08/10/2015 11:09   [place] Thoracentesis Asp Pleural Space W/img Guide  08/11/2015  INDICATION: Shortness of breath. Left-sided pleural effusion. Request for diagnostic and therapeutic thoracentesis. EXAM: ULTRASOUND GUIDED LEFT THORACENTESIS MEDICATIONS: None. COMPLICATIONS: None immediate. PROCEDURE: An ultrasound guided thoracentesis was thoroughly discussed with the patient and questions answered. The benefits, risks, alternatives and complications were also discussed. The patient understands and wishes to proceed with the procedure. Written consent was obtained. Ultrasound was performed to localize and mark an adequate pocket of fluid in the left chest. The area was then prepped and draped in the normal sterile fashion. 1% Lidocaine was used for local anesthesia. Under ultrasound guidance a 280 mL catheter was introduced. Thoracentesis was performed. The catheter was removed and a dressing applied. FINDINGS: A total of approximately 280 mL of clear yellow fluid was removed. Samples were submitted to the lab as requested. IMPRESSION: Successful ultrasound guided left thoracentesis yielding 280 mL of pleural fluid. Read by: Brayton ElKevin Bruning PA-C Electronically Signed   By: Gilmer MorJaime  Wagner D.O.   On: 08/11/2015 11:12    ASSESSMENT AND PLAN  Tonya Garry Heater  Farley is a 82 y.o. female with a history of renal artery stenosis, HTN, chronic Afib on coumadin, and hypothyroidism [facility] with tachycardia and SOB.   Dyspnea: She has had recurrent pleural effusion post pneumonia and influenza. Not clear that this is cardiac related. IV Lasix 40mg  iv bid .  Post left US guided thoracentesis With only 280 cc removed Needs IS continue diuresis not quite ready for d/c based on exam   Echo shows normal LV function but some pulmonary hypertension. Plan was to add vasodilator (revatio) for pre load reduction. I don't see that this order was placed so will add Revatio 20mg  TID now.  Afib: Chronic on coumadin. INR therapeutic. Continue cardizem and labetalol increased to 300  bid. HR with better control in 90s. Lab Results  Component Value Date   INR 2.40* 08/12/2015   INR 2.19* 08/11/2015   INR 2.07* 08/10/2015     Murmur: Mild to moderate AS on last echo no need to repeat  at this time   Thyroid: Continue synthroid replacement    Charlton Haws

## 2015-08-13 LAB — BASIC METABOLIC PANEL
ANION GAP: 8 (ref 5–15)
BUN: 28 mg/dL — AB (ref 6–20)
CALCIUM: 8.6 mg/dL — AB (ref 8.9–10.3)
CO2: 26 mmol/L (ref 22–32)
Chloride: 105 mmol/L (ref 101–111)
Creatinine, Ser: 1.62 mg/dL — ABNORMAL HIGH (ref 0.44–1.00)
GFR calc Af Amer: 30 mL/min — ABNORMAL LOW (ref >60)
GFR, EST NON AFRICAN AMERICAN: 26 mL/min — AB (ref >60)
GLUCOSE: 106 mg/dL — AB (ref 65–99)
Potassium: 4 mmol/L (ref 3.5–5.1)
Sodium: 139 mmol/L (ref 135–145)

## 2015-08-13 LAB — PROTIME-INR
INR: 2.78 — AB (ref 0.00–1.49)
PROTHROMBIN TIME: 28.1 s — AB (ref 11.6–15.2)

## 2015-08-13 LAB — MAGNESIUM: Magnesium: 1.8 mg/dL (ref 1.7–2.4)

## 2015-08-13 MED ORDER — SILDENAFIL CITRATE 20 MG PO TABS: 20.0000 mg | ORAL_TABLET | Freq: Three times a day (TID) | ORAL | Status: DC

## 2015-08-13 NOTE — Progress Notes (Signed)
Patient ID: MUNTAHA VERMETTE, female   DOB: 10-16-1919, 82 y.o.   MRN: [id number]   Patient Name: Tonya Farley Date of Encounter: 08/13/2015     Active Problems:   Atrial fibrillation (HCC)   Dyspnea   Pleural effusion   Anasarca   Peripheral edema   Diastolic CHF, acute on chronic (HCC)   Hypokalemia   Pleural effusion on left    SUBJECTIVE  She thinks she is doing fine  Ready for d/c   CURRENT MEDS . atorvastatin  10 mg Oral Daily  . B-complex with vitamin C  1 tablet Oral Daily  . brimonidine  1 drop Both Eyes TID  . budesonide  0.5 mg Nebulization BID  . diltiazem  360 mg Oral Daily  . furosemide  40 mg Intravenous BID  . labetalol  200 mg Oral BID  . latanoprost  1 drop Both Eyes QHS  . levothyroxine  100 mcg Oral QAC breakfast  . pantoprazole  40 mg Oral Daily  . potassium chloride SA  20 mEq Oral Daily  . sildenafil  20 mg Oral TID  . timolol  1 drop Both Eyes q morning - 10a  . Warfarin - Pharmacist Dosing Inpatient   Does not apply q1800    OBJECTIVE  Filed Vitals:   08/12/15 1310 08/12/15 1953 08/12/15 2215 08/13/15 0500  BP: 107/42 129/46  113/45  Pulse: 71 67  92  Temp: 97.8 F (36.6 C) 97.7 F (36.5 C)  98 F (36.7 C)  TempSrc: Oral Oral  Oral  Resp: 16 18  18   Height:      Weight:    67 kg (147 lb 11.3 oz)  SpO2: 97% 97% 95% 96%    Intake/Output Summary (Last 24 hours) at 08/13/15 0849 Last data filed at 08/13/15 0508  Gross per 24 hour  Intake    810 ml  Output   1051 ml  Net   -241 ml   Filed Weights   08/11/15 0520 08/12/15 0540 08/13/15 0500  Weight: 67.813 kg (149 lb 8 oz) 66.452 kg (146 lb 8 oz) 67 kg (147 lb 11.3 oz)    PHYSICAL EXAM  General: Pleasant, NAD. Elderly and frail Neuro: Alert and oriented X 3. Moves all extremities spontaneously. Psych: Normal affect. HEENT:  Normal  Neck: Supple without bruits or JVD. Lungs:  Resp regular and unlabored, bilateral atelectasis and crackles base  Heart: S1/S2 SEM murmur,  no rub, gallop or click. Abdomen: Soft, non-tender, non-distended, BS + x 4.  Extremities: No clubbing, cyanosis. 2+LE edema. DP/PT/Radials 2+ and equal bilaterally.  Accessory Clinical Findings  CBC  Recent Labs  08/10/15 1348 08/11/15 0504 08/12/15 0525  WBC 8.3 9.3 8.1  NEUTROABS 6.1 7.1  --   HGB 11.2* 10.5* 10.4*  HCT 35.8* 33.2* 31.9*  MCV 87.3 85.3 85.3  PLT 281 255 257   Basic Metabolic Panel  Recent Labs  08/12/15 0525 08/13/15 0535  NA 142 139  K 4.1 4.0  CL 106 105  CO2 28 26  GLUCOSE 98 106*  BUN 21* 28*  CREATININE 1.55* 1.62*  CALCIUM 8.9 8.6*  MG 1.8 1.8   Liver Function Tests  Recent Labs  08/10/15 1348 08/11/15 0504  AST 17 17  ALT 18 16  ALKPHOS 68 60  BILITOT 0.9 0.6  PROT 6.8 5.9*  ALBUMIN 3.6 3.2*    TELE  afib with HR in 90s  Radiology/Studies  Dg Chest 1 View  08/11/2015  CLINICAL  DATA:  Status post left thoracentesis. EXAM: CHEST 1 VIEW COMPARISON:  08/10/2015 FINDINGS: The cardiac silhouette is slightly enlarged. Thoracic aortic atherosclerosis is noted. There is a small left pleural effusion, decreased in size following interval thoracentesis. No pneumothorax is identified. No sizable right-sided pleural effusion is identified. Mild bibasilar opacities likely reflect atelectasis, with improved aeration of the left lung base. IMPRESSION: 1. Small residual left pleural effusion following interval thoracentesis. No pneumothorax. 2. Mild bibasilar atelectasis. Electronically Signed   By: Sebastian Ache M.D.   On: 08/11/2015 11:28   Dg Chest 2 View  08/10/2015  CLINICAL DATA:  82 year old female increased blood pressure EXAM: CHEST  2 VIEW COMPARISON:  06/17/2015, 06/13/2015, chest CT 06/12/2015 FINDINGS: Cardiomediastinal silhouette likely unchanged, partially obscured by overlying lung/pleural disease. Increasing opacity at the left base, with blunting of the left costophrenic angle, obscuration of the left hemidiaphragm and  retrocardiac region. Opacity in the costophrenic sulcus on the lateral view with meniscus present. Coarsened interstitial markings bilaterally. No interlobular septal thickening. Likely small right-sided pleural effusion. Stay linear opacities at the hilar regions, similar to comparison. No pneumothorax. Treated lower thoracic compression fracture again noted. Treated lumbar compression fracture noted. Atherosclerosis. IMPRESSION: Enlarging left-sided pleural effusion, with associated atelectasis/ consolidation. No evidence of overt edema. Small right-sided pleural effusion. Signed, Yvone Neu. Loreta Ave, DO [facility] Electronically Signed   By: Gilmer Mor D.O.   On: 08/10/2015 11:09   US Thoracentesis Asp Pleural Space W/img Guide  08/11/2015  INDICATION: Shortness of breath. Left-sided pleural effusion. Request for diagnostic and therapeutic thoracentesis. EXAM: ULTRASOUND GUIDED LEFT THORACENTESIS MEDICATIONS: None. COMPLICATIONS: None immediate. PROCEDURE: An ultrasound guided thoracentesis was thoroughly discussed with the patient and questions answered. The benefits, risks, alternatives and complications were also discussed. The patient understands and wishes to proceed with the procedure. Written consent was obtained. Ultrasound was performed to localize and mark an adequate pocket of fluid in the left chest. The area was then prepped and draped in the normal sterile fashion. 1% Lidocaine was used for local anesthesia. Under ultrasound guidance a 280 mL catheter was introduced. Thoracentesis was performed. The catheter was removed and a dressing applied. FINDINGS: A total of approximately 280 mL of clear yellow fluid was removed. Samples were submitted to the lab as requested. IMPRESSION: Successful ultrasound guided left thoracentesis yielding 280 mL of pleural fluid. Read by: Brayton El PA-C Electronically Signed   By: Gilmer Mor D.O.   On:  08/11/2015 11:12    ASSESSMENT AND PLAN  Tonya Farley is a 82 y.o. female with a history of renal artery stenosis, HTN, chronic Afib on coumadin, and hypothyroidism who presented to [facility] ED 08/10/15 with tachycardia and SOB.   Dyspnea: She has had recurrent pleural effusion post pneumonia and influenza. Not clear that this is cardiac related. IV Lasix  iv bid .  Post left US guided thoracentesis With only 280 cc removed Needs IS continue diuresis    Echo shows normal LV function but some pulmonary hypertension. Plan was to add vasodilator (revatio) for pre load reduction. I don't see that this order was placed so will add Revatio  TID now.  Afib: Chronic on coumadin. INR therapeutic. Continue cardizem and labetalol increased to 300  bid. HR with better control in 90s. Lab Results  Component Value Date   INR 2.78* 08/13/2015   INR 2.40* 08/12/2015   INR 2.19* 08/11/2015     Murmur: Mild to moderate AS on last  echo no need to repeat at this time   Thyroid: Continue synthroid replacement    On d/c note changes to meds with increase in labetalol dose and addition of revatio  Charlton HawsPeter Jeziah Kretschmer

## 2015-08-13 NOTE — Care Management Note (Signed)
Case Management Note  Patient Details  Name: Driscilla MoatsMamie N Rennaker MRN: [id number] Date of Birth: 03/08/1920  Subjective/Objective:                  Pleural effusion on left  Action/Plan: CM s;poke with patient at the bedside. [facility] of the [facility] and discharge date of today. No other needs identified.   Expected Discharge Date:  08/13/15               Expected Discharge Plan:  Home w Home Health Services  In-House Referral:     Discharge planning Services  CM Consult  Post Acute Care Choice:  Home Health Choice offered to:  Patient  DME Arranged:  N/A DME Agency:  NA  HH Arranged:  RN, PT, OT, Nurse's Aide HH [facility]  Status of Service:  Completed, signed off  Medicare Important Message Given:    Date Medicare IM Given:    Medicare IM give by:    Date Additional Medicare IM Given:    Additional Medicare Important Message give by:     If discussed at Long Length of Stay Meetings, dates discussed:    Additional Comments:  Antony HasteBennett, Decarla Siemen Harris, RN 08/13/2015, 11:25 AM

## 2015-08-13 NOTE — Discharge Summary (Signed)
Discharge Summary  Tonya Farley ZOX:[id number] DOB: [birth year]  PCP: Lorenda Peck, MD  Admit date: 08/10/2015 Discharge date: 08/13/2015  Time spent: <11mins  Recommendations for Outpatient Follow-up:  1. F/u with cardiology for afib/diastolic chf/lower extremity edema/pulmonary hypertension for which Revatio started from this hospitalization per cardiology Dr Charlton Haws recommendation. 2. F/u with PMD within two week  for hospital discharge follow up, repeat cbc/bmp at follow up  Discharge Diagnoses:  Active Hospital Problems   Diagnosis Date Noted  . Pleural effusion on left   . Peripheral edema   . Diastolic CHF, acute on chronic (HCC)   . Hypokalemia   . Anasarca 08/10/2015  . Pleural effusion   . Dyspnea   . Atrial fibrillation (HCC) 06/25/2012    Resolved Hospital Problems   Diagnosis Date Noted Date Resolved  No resolved problems to display.    Discharge Condition: stable  Diet recommendation: heart healthy  Filed Weights   08/11/15 0520 08/12/15 0540 08/13/15 0500  Weight: 67.813 kg (149 lb 8 oz) 66.452 kg (146 lb 8 oz) 67 kg (147 lb 11.3 oz)    History of present illness:  Chief Complaint: sob, tachycardia  HPI: Tonya Farley is a 82 y.o. female independent from home with h/o hypothyroidism, H/o chronic afib on coumadin, h/o diastolic chf presented to ED due to progressive sob, tachycardia, lower extremity edema. She denies cough, no fever, no chest pain. She reported was treated for pna and pleural effusion in the hospital in march and was sent to rehab for two weeks , then she was discharged home from rehab.   ED course: she was in afib/rvr initially on presentation, she is on room air, not in acute respiratory distress at rest, cxr with enlarging left sided pleural effusion, with associated atelectasis/consulidation, no evidence of over edema, small right sided pleural effusion. ekg with afib/rvr, bnp 533, troponin negative, she does not  have fever, no leukocytosis, cr 1.32 (baseline 1.13), INR 2.07, she was given lasix  iv and vanc/aztreonam, cardiology consulted for afib/chf, hospitalist called to admit the patient.  Hospital Course:  Active Problems:   Atrial fibrillation (HCC)   Dyspnea   Pleural effusion   Anasarca   Peripheral edema   Diastolic CHF, acute on chronic (HCC)   Hypokalemia   Pleural effusion on left  Sob with pleural effusion/bilateral lower extremity edema on presentation: with h/o chronic afib and diastolic chf, bnp elevated compare to baseline, possible acute on chronic diastolic chf, she remained on room air, no requirement of oxygen supplement during hospitalization She received lasix to  IV  Bid in the hospital Patient is nontoxic appearing, no leukocytosis, no fever, she received one dose of vanc and aztreonam in the ED, no additional abx given during hospitalization.  s/p thoracentesis on 5/19 with clear yellow fluids, total protein less than 3, LDH ration less than 3/2, gram stain no organism seen, culture no growth, fluids likely transudative. Cardiology consulted, input appreciated Improved, she is discharged on home dose lasix due to slowly increase cr, need close follow up with cardiology  Pulmonary hypertension: by echo from 05/2015, pa peak pressure , cardiology started Revatio, close follow up with cardiology  Afib/rvr:  She presented with afib/RVr, heart rate controlled at discharge continue home meds cardizem/labetalol/coumadin , cardiology consulted   CKD III: monitor cr with diuresis, cr 1.32-1.28-1.55-1.62, repeat bmp at hospital follow up  Hypothyroidism: Continue synthroid  Recent history of infuluenza/pna /pleural effusion in 05/2015 required hospitalization and thoracentesis at  the time.  FTT; maximize home health, care manager informed   DVT prophylaxis: on coumadin  Consultants: cardiology   Code Status: DNR  Family Communication: Patient    Disposition Plan: home  With home health on 5/21   Procedures:  Thoracentesis on 5/19 with 280cc clear pleural fluids drained  Antibiotics:  Vanc/aztreonam x1 in the ED   Discharge Exam: BP 113/45 mmHg  Pulse 92  Temp(Src) 98 F (36.7 C) (Oral)  Resp 18  Ht 5\' 4"  (1.626 m)  Wt 67 kg (147 lb 11.3 oz)  BMI 25.34 kg/m2  SpO2 96%   General: Appear younger than stated age  Eyes: PERRL  ENT: unremarkable  Neck: supple, no JVD  Cardiovascular: IRRR  Respiratory: diminished at basis, no wheezing, no rhonchi  Abdomen: soft/ND/ND, positive bowel sounds  Skin: no rash  Musculoskeletal: 3+pitting edema bilateral lower extremity on presentation, has improved  Psychiatric: calm/cooperative  Neurologic: no focal findings    Discharge Instructions You were cared for by a hospitalist during your hospital stay. If you have any questions about your discharge medications or the care you received while you were in the hospital after you are discharged, you can call the unit and asked to speak with the hospitalist on call if the hospitalist that took care of you is not available. Once you are discharged, your primary care physician will handle any further medical issues. Please note that NO REFILLS for any discharge medications will be authorized once you are discharged, as it is imperative that you return to your primary care physician (or establish a relationship with a primary care physician if you do not have one) for your aftercare needs so that they can reassess your need for medications and monitor your lab values.      Discharge Instructions    Diet - low sodium heart healthy    Complete by:  As directed      Face-to-face encounter (required for Medicare/Medicaid patients)    Complete by:  As directed   I Krysteena Stalker certify that this patient is under my care and that I, or a nurse practitioner or physician's assistant working with me, had a face-to-face encounter that  meets the physician face-to-face encounter requirements with this patient on 08/13/2015. The encounter with the patient was in whole, or in part for the following medical condition(s) which is the primary reason for home health care (List medical condition): FTT RN to check INR level  And send result to cardiology office.  The encounter with the patient was in whole, or in part, for the following medical condition, which is the primary reason for home health care:  FTT  I certify that, based on my findings, the following services are medically necessary home health services:   Physical therapy Nursing    Reason for Medically Necessary Home Health Services:  Skilled Nursing- Change/Decline in Patient Status  My clinical findings support the need for the above services:  Shortness of breath with activity  Further, I certify that my clinical findings support that this patient is homebound due to:  Shortness of Breath with activity     Home Health    Complete by:  As directed   To provide the following care/treatments:   PT OT Home Health Aide RN    RN to check INR in three days, sent result to cardiology office     Increase activity slowly    Complete by:  As directed  Medication List    STOP taking these medications        acetaminophen 325 MG tablet  Commonly known as:  TYLENOL     benzonatate 200 MG capsule  Commonly known as:  TESSALON     guaiFENesin 600 MG 12 hr tablet  Commonly known as:  MUCINEX     pantoprazole 40 MG tablet  Commonly known as:  PROTONIX     predniSONE 10 MG tablet  Commonly known as:  DELTASONE      TAKE these medications        atorvastatin 10 MG tablet  Commonly known as:  LIPITOR  Take 10 mg by mouth daily.     B-complex with vitamin C tablet  Take 1 tablet by mouth daily.     brimonidine 0.15 % ophthalmic solution  Commonly known as:  ALPHAGAN  Place 1 drop into both eyes 3 (three) times daily.     budesonide 0.5 MG/2ML  nebulizer solution  Commonly known as:  PULMICORT  Take 2 mLs (0.5 mg total) by nebulization 2 (two) times daily.     diltiazem 360 MG 24 hr capsule  Commonly known as:  CARDIZEM CD  Take 1 capsule (360 mg total) by mouth daily.     furosemide 40 MG tablet  Commonly known as:  LASIX  Take 1.5 tablets (60 mg total) by mouth daily.     labetalol 200 MG tablet  Commonly known as:  NORMODYNE  Take 1 tablet (200 mg total) by mouth 2 (two) times daily.     latanoprost 0.005 % ophthalmic solution  Commonly known as:  XALATAN  Place 1 drop into both eyes at bedtime.     levothyroxine 100 MCG tablet  Commonly known as:  SYNTHROID, LEVOTHROID  Take 100 mcg by mouth daily.     potassium chloride SA 20 MEQ tablet  Commonly known as:  K-DUR,KLOR-CON  Take 20 mEq by mouth daily.     sildenafil 20 MG tablet  Commonly known as:  REVATIO  Take 1 tablet (20 mg total) by mouth 3 (three) times daily.     SYSTANE OP  Apply 2 drops to eye daily as needed (dry eyes).     timolol 0.5 % ophthalmic solution  Commonly known as:  TIMOPTIC  Place 1 drop into both eyes every morning.     VITAMIN D PO  Take 1 tablet by mouth daily.     warfarin 1 MG tablet  Commonly known as:  COUMADIN  TAKE AS DIRECTED BY COUMADIN CLINIC       Allergies  Allergen Reactions  . Penicillins Hives and Swelling    Has patient had a PCN reaction causing immediate rash, facial/tongue/throat swelling, SOB or lightheadedness with hypotension: Yes Has patient had a PCN reaction causing severe rash involving mucus membranes or skin necrosis: Yes Has patient had a PCN reaction that required hospitalization No Has patient had a PCN reaction occurring within the last 10 years: No If all of the above answers are "NO", then may proceed with Cephalosporin use.    . Sulfa Antibiotics Anaphylaxis and Swelling   Follow-up Information    Follow up with Lance Muss, MD In 1 week.   Specialties:  Cardiology,  Radiology, Interventional Cardiology   Why:  hospital discharge follow up, monitor REVAtio use, monitor renal function and lasix dose, monitor INR   Contact information:   [address] [place] [address] [phone]  Follow up with ROBERTS, Vernie AmmonsONALD WAYNE, MD In 2 weeks.   Specialty:  Internal Medicine   Why:  hospital discharge follow up, repeat cbc/bmp at follow up   Contact information:   [address] [place] [address] [phone]        The results of significant diagnostics from this hospitalization (including imaging, microbiology, ancillary and laboratory) are listed below for reference.    Significant Diagnostic Studies: Dg Chest 1 View  08/11/2015  CLINICAL DATA:  Status post left thoracentesis. EXAM: CHEST 1 VIEW COMPARISON:  08/10/2015 FINDINGS: The cardiac silhouette is slightly enlarged. Thoracic aortic atherosclerosis is noted. There is a small left pleural effusion, decreased in size following interval thoracentesis. No pneumothorax is identified. No sizable right-sided pleural effusion is identified. Mild bibasilar opacities likely reflect atelectasis, with improved aeration of the left lung base. IMPRESSION: 1. Small residual left pleural effusion following interval thoracentesis. No pneumothorax. 2. Mild bibasilar atelectasis. Electronically Signed   By: Sebastian AcheAllen  Grady M.D.   On: 08/11/2015 11:28   Dg Chest 2 View  08/10/2015  CLINICAL DATA:  82 year old female increased blood pressure EXAM: CHEST  2 VIEW COMPARISON:  06/17/2015, 06/13/2015, chest CT 06/12/2015 FINDINGS: Cardiomediastinal silhouette likely unchanged, partially obscured by overlying lung/pleural disease. Increasing opacity at the left base, with blunting of the left costophrenic angle, obscuration of the left hemidiaphragm and retrocardiac region. Opacity in the costophrenic sulcus on the lateral view with meniscus present. Coarsened interstitial markings bilaterally. No  interlobular septal thickening. Likely small right-sided pleural effusion. Stay linear opacities at the hilar regions, similar to comparison. No pneumothorax. Treated lower thoracic compression fracture again noted. Treated lumbar compression fracture noted. Atherosclerosis. IMPRESSION: Enlarging left-sided pleural effusion, with associated atelectasis/ consolidation. No evidence of overt edema. Small right-sided pleural effusion. Signed, Yvone NeuJaime S. Loreta AveWagner, DO [facility] Electronically Signed   By: Gilmer MorJaime  Wagner D.O.   On: 08/10/2015 11:09   [place] Thoracentesis Asp Pleural Space W/img Guide  08/11/2015  INDICATION: Shortness of breath. Left-sided pleural effusion. Request for diagnostic and therapeutic thoracentesis. EXAM: ULTRASOUND GUIDED LEFT THORACENTESIS MEDICATIONS: None. COMPLICATIONS: None immediate. PROCEDURE: An ultrasound guided thoracentesis was thoroughly discussed with the patient and questions answered. The benefits, risks, alternatives and complications were also discussed. The patient understands and wishes to proceed with the procedure. Written consent was obtained. Ultrasound was performed to localize and mark an adequate pocket of fluid in the left chest. The area was then prepped and draped in the normal sterile fashion. 1% Lidocaine was used for local anesthesia. Under ultrasound guidance a 280 mL catheter was introduced. Thoracentesis was performed. The catheter was removed and a dressing applied. FINDINGS: A total of approximately 280 mL of clear yellow fluid was removed. Samples were submitted to the lab as requested. IMPRESSION: Successful ultrasound guided left thoracentesis yielding 280 mL of pleural fluid. Read by: Brayton ElKevin Bruning PA-C Electronically Signed   By: Gilmer MorJaime  Wagner D.O.   On: 08/11/2015 11:12    Microbiology: Recent Results (from the past 240 hour(s))  MRSA PCR Screening     Status: None   Collection Time: 08/10/15   6:47 PM  Result Value Ref Range Status   MRSA by PCR NEGATIVE NEGATIVE Final    Comment:        The GeneXpert MRSA Assay (FDA approved for NASAL specimens only), is one component of a comprehensive MRSA colonization surveillance program. It is not intended to diagnose MRSA infection nor to guide or monitor treatment  for MRSA infections.   Culture, body fluid-bottle     Status: None (Preliminary result)   Collection Time: 08/11/15  2:11 PM  Result Value Ref Range Status   Specimen Description FLUID PLEURAL  Final   Special Requests NONE  Final   Culture   Final    NO GROWTH < 24 HOURS Performed at [facility]    Report Status PENDING  Incomplete  Gram stain     Status: None   Collection Time: 08/11/15  2:11 PM  Result Value Ref Range Status   Specimen Description FLUID PLEURAL  Final   Special Requests NONE  Final   Gram Stain   Final    MODERATE WBC PRESENT, PREDOMINANTLY MONONUCLEAR NO ORGANISMS SEEN Performed at  [facility]    Report Status 08/11/2015 FINAL  Final     Labs: Basic Metabolic Panel:  Recent Labs Lab 08/10/15 1348 08/11/15 0504 08/12/15 0525 08/13/15 0535  NA 143 143 142 139  K 4.0 3.5 4.1 4.0  CL 108 108 106 105  CO2 27 28 28 26   GLUCOSE 100* 94 98 106*  BUN 20 20 21* 28*  CREATININE 1.32* 1.28* 1.55* 1.62*  CALCIUM 9.3 8.7* 8.9 8.6*  MG  --  1.8 1.8 1.8   Liver Function Tests:  Recent Labs Lab 08/10/15 1348 08/11/15 0504  AST 17 17  ALT 18 16  ALKPHOS 68 60  BILITOT 0.9 0.6  PROT 6.8 5.9*  ALBUMIN 3.6 3.2*   No results for input(s): LIPASE, AMYLASE in the last 168 hours. No results for input(s): AMMONIA in the last 168 hours. CBC:  Recent Labs Lab 08/10/15 1348 08/11/15 0504 08/12/15 0525  WBC 8.3 9.3 8.1  NEUTROABS 6.1 7.1  --   HGB 11.2* 10.5* 10.4*  HCT 35.8* 33.2* 31.9*  MCV 87.3 85.3 85.3  PLT 281 255 257   Cardiac Enzymes: No results for input(s): CKTOTAL, CKMB, CKMBINDEX, TROPONINI in the  last 168 hours. BNP: BNP (last 3 results)  Recent Labs  06/14/15 0813 08/10/15 1348 08/12/15 0525  BNP 365.3* 533.1* 201.3*    ProBNP (last 3 results) No results for input(s): PROBNP in the last 8760 hours.  CBG: No results for input(s): GLUCAP in the last 168 hours.     SignedAlbertine Grates MD, PhD  [facility] 08/13/2015, 10:24 AM

## 2015-08-14 LAB — PH, BODY FLUID: pH, Body Fluid: 8.1

## 2015-08-15 ENCOUNTER — Other Ambulatory Visit: Payer: Self-pay

## 2015-08-15 MED ORDER — SILDENAFIL CITRATE 20 MG PO TABS: 20.0000 mg | ORAL_TABLET | Freq: Three times a day (TID) | ORAL | Status: DC

## 2015-08-16 ENCOUNTER — Ambulatory Visit (INDEPENDENT_AMBULATORY_CARE_PROVIDER_SITE_OTHER): Payer: Medicare Other | Admitting: Interventional Cardiology

## 2015-08-16 DIAGNOSIS — I482 Chronic atrial fibrillation, unspecified: Secondary | ICD-10-CM

## 2015-08-16 DIAGNOSIS — Z5181 Encounter for therapeutic drug level monitoring: Secondary | ICD-10-CM

## 2015-08-16 LAB — CULTURE, BODY FLUID W GRAM STAIN -BOTTLE: Culture: NO GROWTH

## 2015-08-16 LAB — POCT INR: INR: 2.7

## 2015-08-17 ENCOUNTER — Telehealth: Payer: Self-pay | Admitting: Interventional Cardiology

## 2015-08-17 ENCOUNTER — Telehealth: Payer: Self-pay

## 2015-08-17 NOTE — Telephone Encounter (Signed)
Spoke with caregiver, [name], and states that pt recently hospitalized and plan was to have PT at home after discharge. [name] with [facility] called and spoke with pt's PCP and they advised her that cardiology would be taking care of these PT orders. [name] states that pt had PT prior to hospitalization as well and it was followed by PCP so she was unsure why they said this. Charna Busmandvised Karen that I would route this message to Dr. Eldridge DaceVaranasi for review and advisement but normally PCP does follow Home Health orders. [name] verbalized understanding and was appreciative for call back.

## 2015-08-17 NOTE — Telephone Encounter (Signed)
PCP would normally take care of PT.

## 2015-08-17 NOTE — Telephone Encounter (Signed)
Prior auth for Sildenafil 20mg  [facility] to [facility] [employer].

## 2015-08-17 NOTE — Telephone Encounter (Signed)
NEW MESSAGE   [name] is calling for Dr.Varanasi to sign any forms and agree to pt having PT at home   [facility] contact: Dorian [phone] is the PT   Pt [facility] [name] and Tresa EndoKelly comes to home

## 2015-08-18 NOTE — Telephone Encounter (Signed)
Tonya Farley is advised to contact the pts PCP. She verbalized understanding.

## 2015-08-22 ENCOUNTER — Encounter: Payer: Self-pay | Admitting: Interventional Cardiology

## 2015-08-22 ENCOUNTER — Telehealth: Payer: Self-pay

## 2015-08-22 ENCOUNTER — Telehealth: Payer: Self-pay | Admitting: Interventional Cardiology

## 2015-08-22 NOTE — Telephone Encounter (Signed)
Authorization for Sildenafil 20 mg approved through 03/24/2016. Referral number [id number].

## 2015-08-22 NOTE — Telephone Encounter (Signed)
New message      The home health nurse just needs a verbal order to draw the PT/INR/ the nurse for home health is there now she just wants to make sure she can draw the lab today instead of tomorrow.

## 2015-08-22 NOTE — Telephone Encounter (Signed)
Tonya Farley is advised to contact the pts PCP as the pts INR is managed by her PCP. She verbalized understanding.

## 2015-08-23 ENCOUNTER — Ambulatory Visit
Admission: RE | Admit: 2015-08-23 | Discharge: 2015-08-23 | Disposition: A | Discharge status: Home / Self Care | Payer: Medicare Other | Source: Ambulatory Visit | Attending: Cardiology | Admitting: Cardiology

## 2015-08-23 ENCOUNTER — Ambulatory Visit (INDEPENDENT_AMBULATORY_CARE_PROVIDER_SITE_OTHER): Payer: Medicare Other | Admitting: Cardiology

## 2015-08-23 ENCOUNTER — Encounter: Payer: Self-pay | Admitting: Cardiology

## 2015-08-23 VITALS — BP 140/62 | HR 74 | Ht 64.0 in | Wt 149.8 lb

## 2015-08-23 DIAGNOSIS — D649 Anemia, unspecified: Secondary | ICD-10-CM | POA: Diagnosis not present

## 2015-08-23 DIAGNOSIS — I272 Other secondary pulmonary hypertension: Secondary | ICD-10-CM

## 2015-08-23 DIAGNOSIS — I701 Atherosclerosis of renal artery: Secondary | ICD-10-CM

## 2015-08-23 DIAGNOSIS — J90 Pleural effusion, not elsewhere classified: Secondary | ICD-10-CM | POA: Diagnosis not present

## 2015-08-23 DIAGNOSIS — I5032 Chronic diastolic (congestive) heart failure: Secondary | ICD-10-CM | POA: Diagnosis not present

## 2015-08-23 DIAGNOSIS — N184 Chronic kidney disease, stage 4 (severe): Secondary | ICD-10-CM | POA: Diagnosis not present

## 2015-08-23 DIAGNOSIS — R0600 Dyspnea, unspecified: Secondary | ICD-10-CM

## 2015-08-23 DIAGNOSIS — I482 Chronic atrial fibrillation, unspecified: Secondary | ICD-10-CM

## 2015-08-23 DIAGNOSIS — E038 Other specified hypothyroidism: Secondary | ICD-10-CM

## 2015-08-23 LAB — CBC WITH DIFFERENTIAL/PLATELET
Basophils Absolute: 0 cells/uL (ref 0–200)
Basophils Relative: 0 %
EOS ABS: 176 {cells}/uL (ref 15–500)
Eosinophils Relative: 2 %
HEMATOCRIT: 34.4 % — AB (ref 35.0–45.0)
Hemoglobin: 11 g/dL — ABNORMAL LOW (ref 11.7–15.5)
LYMPHS PCT: 11 %
Lymphs Abs: 968 cells/uL (ref 850–3900)
MCH: 27 pg (ref 27.0–33.0)
MCHC: 32 g/dL (ref 32.0–36.0)
MCV: 84.5 fL (ref 80.0–100.0)
MONO ABS: 880 {cells}/uL (ref 200–950)
MONOS PCT: 10 %
MPV: 9.8 fL (ref 7.5–12.5)
NEUTROS PCT: 77 %
Neutro Abs: 6776 cells/uL (ref 1500–7800)
Platelets: 232 10*3/uL (ref 140–400)
RBC: 4.07 MIL/uL (ref 3.80–5.10)
RDW: 18.1 % — AB (ref 11.0–15.0)
WBC: 8.8 10*3/uL (ref 3.8–10.8)

## 2015-08-23 LAB — BASIC METABOLIC PANEL
BUN: 20 mg/dL (ref 7–25)
CHLORIDE: 104 mmol/L (ref 98–110)
CO2: 25 mmol/L (ref 20–31)
CREATININE: 1.38 mg/dL — AB (ref 0.60–0.88)
Calcium: 9.1 mg/dL (ref 8.6–10.4)
GLUCOSE: 114 mg/dL — AB (ref 65–99)
POTASSIUM: 4.3 mmol/L (ref 3.5–5.3)
Sodium: 142 mmol/L (ref 135–146)

## 2015-08-23 NOTE — Patient Instructions (Signed)
Medication Instructions:  1. Your physician recommends that you continue on your current medications as directed. Please refer to the Current Medication list given to you today.   Labwork: 1. TODAY BMET, CBC W/DIFF, PT/INR  Testing/Procedures: CHEST X-RAY TODAY AT [address] AT [facility]; DX PLEURAL EFFUSION  Follow-Up: 1. APPT WITH PA/NP SAME DAY DR. Eldridge DaceVARANASI IS IN THE OFFICE IN 2 WEEKS  2. FIRST AVAILABLE WITH DR. VARANASI   Any Other Special Instructions Will Be Listed Below (If Applicable).     If you need a refill on your cardiac medications before your next appointment, please call your pharmacy.

## 2015-08-23 NOTE — Progress Notes (Signed)
Cardiology Office Note   Date:  08/23/2015   ID:  Tonya Farley, DOB [birth year], MRN [id number]  PCP:  Lorenda Peck, MD  Cardiologist: Dr. Eldridge Dace    Chief Complaint  Patient presents with  . Hospitalization Follow-up      History of Present Illness: Tonya Farley is a 82 y.o. female who presents for post hospital eval.    She has renal artery stenosis, HTN, AFib.on coumadin and hypothyroidism.   Recent hospitalization 05/2015 for PNA with bilateral pleural effusions. Did have a right thoracentesis during that hospital admission Previous to that she was admitted for influenza. Readmitted 08/10/15 for SOB and tachycardia.  She was in rapid a fib with chronic a fib. CXR with enlarging left sided pleural effusion and possible consolidation and small right pleural effusion.  Echo 06/04/15 showed normal EF with non critical AS and some RV failure with estimated PA pressure of 65 mmHg. She was started on sildenafil 20 mg TID, she has not taken since discharge due to needing pre-authorization.  It has now been cleared and she will start this evening.  Her HR at discharge was controlled in her chronic a fib. Her labetalol was increased.     Today no chest pain and No SOB, + lower ext. Edema. She has decreased salt intake, rinsing off greens and not using salt shaker.  Her BP is stable. She lives alone but has a Comptroller.  Her edema is improved in the mornings.   She has had INR on the coumadin since discharge but needs it checked this week, will have blood drawn in the lab.       Past Medical History  Diagnosis Date  . Hypertension   . Hyperlipidemia   . Thyroid disease   . Hypothyroidism   . Chronic back pain   . PONV (postoperative nausea and vomiting)   . Chronic a-fib (HCC)   . A-fib (HCC)   . PNA (pneumonia)     Past Surgical History  Procedure Laterality Date  . Appendectomy    . Tonsillectomy    . Cataracts      bilateral  . Eye surgery      left eye "hole"      Current Outpatient Prescriptions  Medication Sig Dispense Refill  . atorvastatin (LIPITOR) 10 MG tablet Take 10 mg by mouth daily.    . B Complex-C (B-COMPLEX WITH VITAMIN C) tablet Take 1 tablet by mouth daily.    . brimonidine (ALPHAGAN) 0.15 % ophthalmic solution Place 1 drop into both eyes 3 (three) times daily.    . budesonide (PULMICORT) 0.5 MG/2ML nebulizer solution Take 2 mLs (0.5 mg total) by nebulization 2 (two) times daily.    . Cholecalciferol (VITAMIN D PO) Take 1 tablet by mouth daily.     Marland Kitchen diltiazem (CARDIZEM CD) 360 MG 24 hr capsule Take 1 capsule (360 mg total) by mouth daily. 90 capsule 0  . furosemide (LASIX) 40 MG tablet Take 1.5 tablets (60 mg total) by mouth daily.    Marland Kitchen labetalol (NORMODYNE) 200 MG tablet Take 1 tablet (200 mg total) by mouth 2 (two) times daily. 60 tablet 5  . latanoprost (XALATAN) 0.005 % ophthalmic solution Place 1 drop into both eyes at bedtime.  3  . levothyroxine (SYNTHROID, LEVOTHROID) 100 MCG tablet Take 100 mcg by mouth daily.    Bertram Gala Glycol-Propyl Glycol (SYSTANE OP) Apply 2 drops to eye daily as needed (dry eyes).     . potassium  chloride SA (K-DUR,KLOR-CON) 20 MEQ tablet Take 20 mEq by mouth daily.    . sildenafil (REVATIO) 20 MG tablet Take 1 tablet (20 mg total) by mouth 3 (three) times daily. 90 tablet 5  . timolol (TIMOPTIC) 0.5 % ophthalmic solution Place 1 drop into both eyes every morning.   3  . warfarin (COUMADIN) 1 MG tablet TAKE AS DIRECTED BY COUMADIN CLINIC (Patient taking differently: Takes 2mg  everyday except 2.5mg  on Mondays and Friday) 200 tablet 1   No current facility-administered medications for this visit.    Allergies:   Penicillins and Sulfa antibiotics    Social History:  The patient  reports that she has never smoked. She has never used smokeless tobacco. She reports that she does not drink alcohol or use illicit drugs.   Family History:  The patient's family history includes Cancer in her mother;  Heart attack in her father; Heart disease in her sister and sister; Other in her father.    ROS:  General:no colds or fevers,  weight up 2 lbs from hospital Skin:no rashes or ulcers HEENT:no blurred vision, no congestion CV:see HPI PUL:see HPI GI:no diarrhea constipation or melena, no indigestion GU:no hematuria, no dysuria MS:no joint pain, no claudication Neuro:no syncope, no lightheadedness Endo:no diabetes, + thyroid disease  Wt Readings from Last 3 Encounters:  08/23/15 149 lb 12.8 oz (67.949 kg)  08/13/15 147 lb 11.3 oz (67 kg)  06/19/15 156 lb 12.8 oz (71.124 kg)     PHYSICAL EXAM: VS:  BP 140/62 mmHg  Pulse 74  Ht 5\' 4"  (1.626 m)  Wt 149 lb 12.8 oz (67.949 kg)  BMI 25.70 kg/m2 , BMI Body mass index is 25.7 kg/(m^2). General:Pleasant affect, NAD Skin:Warm and dry, brisk capillary refill HEENT:normocephalic, sclera clear, mucus membranes moist, hard of hearing. Neck:supple, no JVD, no bruits  Heart:irreg irreg soft systolic murmur,no  gallup, rub or click Lungs:clear with few rales,no rhonchi, or wheezes ZOX:WRUEAbd:soft, non tender, + BS, do not palpate liver spleen or masses Ext:2+ lower ext edema, 2+ radial pulses Neuro:alert and oriented,X 3 MAE, follows commands, + facial symmetry    EKG:  EKG is ordered today. The ekg ordered today demonstrates chronic a fib rate controlled at 74, LAD. No acute changes.   Recent Labs: 06/10/2015: TSH 1.325 08/11/2015: ALT 16 08/12/2015: B Natriuretic Peptide 201.3*; Hemoglobin 10.4*; Platelets 257 08/13/2015: BUN 28*; Creatinine, Ser 1.62*; Magnesium 1.8; Potassium 4.0; Sodium 139    Lipid Panel No results found for: CHOL, TRIG, HDL, CHOLHDL, VLDL, LDLCALC, LDLDIRECT     Other studies Reviewed: Additional studies/ records that were reviewed today include: hospital notes. Echo: Study Date: 06/04/2015 LV EF: 55% Study Conclusions - Left ventricle: The cavity size was normal. Wall thickness was  increased in a pattern of mild  LVH. The estimated ejection  fraction was 55%. Wall motion was normal; there were no regional  wall motion abnormalities. The study is not technically  sufficient to allow evaluation of LV diastolic function. - Aortic valve: Trileaflet; mildly calcified leaflets. There was  moderate low gradient stenosis. There was trivial regurgitation.  Mean gradient (S): 10 mm Hg. Peak gradient (S): 21 mm Hg. Peak  velocity ratio of LVOT to aortic valve: 0.33. Valve area (Vmax):  1.15 cm^2. - Mitral valve: Calcified annulus. There was mild regurgitation. - Left atrium: The atrium was severely dilated. - Right ventricle: The cavity size was mildly dilated. Systolic  function was moderately reduced. - Right atrium: The atrium was mildly dilated. Central  venous  pressure (est): 3 mm Hg. - Tricuspid valve: There was moderate regurgitation. - Pulmonary arteries: Systolic pressure was severely increased. PA  peak pressure: 65 mm Hg (S). - Pericardium, extracardiac: There was no pericardial effusion. Impressions: - Mild LVH with LVEF approximately 55%. Indeterminate diastolic  function in the setting of atrial fibrillation. Severe left  atrial enlargement. MAC with mild mitral regurgitation. Moderate,  low gradient, calcific aortic stenosis. Mildly dilated RV with  moderately reduced contraction. Moderate tricuspid regurgitation  with evidence of severe pulmonary hypertension, PASP 65 mmHg.   ASSESSMENT AND PLAN:   Dyspnea: She has had recurrent pleural effusion post pneumonia and influenza. Not clear that this is cardiac related. IV Lasix 40mg  iv bid was given with diuresis . Post left US guided thoracentesis  With only 280 cc removed Needs IS continue diuresis recheck CXR today.  Pleural Effusion see above.  Echo shows normal LV function but some pulmonary hypertension. Plan added vasodilator (revatio) for pre load reduction.added Revatio 20mg  TID during hospitalization, will  resume today. Will see her back in 2 weeks for recheck.   Afib: Chronic on coumadin. INR therapeutic. Continue cardizem and labetalol increased to 300 bid. HR with better control in 70s. Today. She has not been aware of any rapid HR.           Murmur: Mild to moderate AS on last echo no need to repeat at this time   Thyroid: Continue synthroid replacement   Pulmonary hypertension.  On revatio resuming today. Possible recheck Echo in 3 months   CKD 4 check BMP today   bil lower ext edema, if BMP stable will increase diuretic for a few days.  Anemia, check CBC.      On d/c note changes to meds with increase in labetalol dose and addition of revatio  Please call nieces first as pt does not hear well or understand instructions.  Current medicines are reviewed with the patient today.  The patient Has no concerns regarding medicines.  The following changes have been made:  See above Labs/ tests ordered today include:see above  Disposition:   FU:  see above  Signed, Nada Boozer, NP  08/23/2015 2:56 PM    [facility] [address] [place], [place], [place]  [address]/ [address] [place], [place] Phone: [phone]; Fax: [phone]  [phone]

## 2015-08-24 ENCOUNTER — Encounter: Payer: Self-pay | Admitting: Interventional Cardiology

## 2015-08-24 LAB — PROTIME-INR
INR: 1.82 — ABNORMAL HIGH (ref <1.50)
Prothrombin Time: 21.3 seconds — ABNORMAL HIGH (ref 11.6–15.2)

## 2015-08-25 ENCOUNTER — Ambulatory Visit (INDEPENDENT_AMBULATORY_CARE_PROVIDER_SITE_OTHER): Payer: Medicare Other | Admitting: Interventional Cardiology

## 2015-08-25 DIAGNOSIS — I482 Chronic atrial fibrillation, unspecified: Secondary | ICD-10-CM

## 2015-08-25 DIAGNOSIS — Z5181 Encounter for therapeutic drug level monitoring: Secondary | ICD-10-CM

## 2015-08-25 NOTE — Telephone Encounter (Signed)
INR addressed by Coumadin clinic.  Pt and family have been contacted and instructions given.

## 2015-08-30 ENCOUNTER — Telehealth: Payer: Self-pay | Admitting: Interventional Cardiology

## 2015-08-30 NOTE — Telephone Encounter (Signed)
Spoke with Dorian and he states that pt's PCP was contacted for orders for PT and they said that they did not feel that PT was needed. Dorian contacting our office to see if Dr. Eldridge DaceVaranasi would be willing to approve orders for PT. Advised that normally we have PCP follow these orders but that I would send a message to Dr. Eldridge DaceVaranasi and would call back with his response. Dorian verbalized understanding and was appreciative for call back.

## 2015-08-30 NOTE — Telephone Encounter (Signed)
New Message  Physical Therapist calling to get orders for pt to begin pt rehab. Please call back and discuss.

## 2015-08-31 NOTE — Telephone Encounter (Signed)
OK to order PT.   

## 2015-09-01 NOTE — Telephone Encounter (Signed)
**Note De-Identified Carlethia Mesquita Obfuscation** Dorian is advised and he verbalized understanding. He states that he will fax over an order for Dr Eldridge DaceVaranasi to sign. He also states that the pt will most likely have PT 2 x a week for 1 month.

## 2015-09-04 ENCOUNTER — Ambulatory Visit: Payer: Medicare Other | Admitting: Cardiology

## 2015-09-07 ENCOUNTER — Telehealth: Payer: Self-pay | Admitting: Interventional Cardiology

## 2015-09-07 ENCOUNTER — Ambulatory Visit (INDEPENDENT_AMBULATORY_CARE_PROVIDER_SITE_OTHER): Payer: Medicare Other

## 2015-09-07 DIAGNOSIS — I482 Chronic atrial fibrillation, unspecified: Secondary | ICD-10-CM

## 2015-09-07 DIAGNOSIS — Z5181 Encounter for therapeutic drug level monitoring: Secondary | ICD-10-CM

## 2015-09-07 LAB — POCT INR: INR: 1.6

## 2015-09-07 NOTE — Telephone Encounter (Signed)
PT- 19.7 INR- 1.6  Current dosage coum  2.5 mg Mon/rFri 2 mg every day other than Mon/Fri

## 2015-09-07 NOTE — Telephone Encounter (Signed)
Result noted, see anticoagulation note in Epic. 

## 2015-09-10 NOTE — Progress Notes (Signed)
Cardiology Office Note:    Date:  09/11/2015   ID:  Tonya Farley, DOB Aug 16, 1919, MRN [id number]  PCP:  Lorenda Peck, MD  Cardiologist:  Dr. Everette Rank   Electrophysiologist:  n/a  Referring MD: Burton Apley, MD   Chief Complaint  Patient presents with  . Follow-up    AFib, Pulmo HTN, Pleural effusion    History of Present Illness:     Tonya Farley is a 82 y.o. female with a hx of RA stenosis, HTN, chronic AF, Coumadin anticoagulation, hypothyroidism.  Admitted in 3/17 with pneumonia and bilateral pleural effusions s/p R thoracentesis.  Echo in 3/17 with normal EF, mild to mod AS, some RV failure and PASP 65 mmHg.  She was admitted again in 5/17 with AF with RVR in the setting of recurrent left effusion. She underwent left-sided thoracentesis. It was not clear that this was cardiac related. Lasix was adjusted. Given her pulmonary hypertension, vasodilator therapy was recommended by Dr. Eden Emms. Revatio 20 mg 3 times a day was recommended. Labetalol dose was adjusted for rate control.  She was seen by Nada Boozer, NP in FU on 08/23/15.  HR was better controlled.  She was not yet on Revatio.   She returns for follow-up.  Here with her niece.  She feels like her breathing has worsened since staring on Revatio.  She also notes nausea and frequent BMs.  She is also concerned that she will not be able to afford this drug (copay is > $100).  She was feeling like her breathing was getting back to normal prior to starting Revatio. She still feels like her legs are swollen.  She denies orthopnea, PND, chest pain or syncope. She denies any bleeding issues.     Past Medical History  Diagnosis Date  . Hypertension   . Hyperlipidemia   . Thyroid disease   . Hypothyroidism   . Chronic back pain   . Chronic a-fib (HCC)   . PNA (pneumonia)   . Chronic diastolic (congestive) heart failure (HCC)     a. Echo 3/17: mild LVH, EF 55%, no RWMA, mod AS (mean 10 mmHg, peak 21 mmHg),  MAC, mild MR, severe LAE, mod reduced RVSF, mild RAE, mod TR, PASP 65 mmHg  . Pulmonary hypertension (HCC)     PASP 65 mmHg at Echo 3/17  . CKD (chronic kidney disease) stage 3, GFR 30-59 ml/min 09/11/2015    Past Surgical History  Procedure Laterality Date  . Appendectomy    . Tonsillectomy    . Cataracts      bilateral  . Eye surgery      left eye "hole"    Current Medications: Outpatient Prescriptions Prior to Visit  Medication Sig Dispense Refill  . atorvastatin (LIPITOR) 10 MG tablet Take 10 mg by mouth daily.    . B Complex-C (B-COMPLEX WITH VITAMIN C) tablet Take 1 tablet by mouth daily.    . brimonidine (ALPHAGAN) 0.15 % ophthalmic solution Place 1 drop into both eyes 3 (three) times daily.    . budesonide (PULMICORT) 0.5 MG/2ML nebulizer solution Take 2 mLs (0.5 mg total) by nebulization 2 (two) times daily.    . Cholecalciferol (VITAMIN D PO) Take 1 tablet by mouth daily.     Marland Kitchen diltiazem (CARDIZEM CD) 360 MG 24 hr capsule Take 1 capsule (360 mg total) by mouth daily. 90 capsule 0  . labetalol (NORMODYNE) 200 MG tablet Take 1 tablet (200 mg total) by mouth 2 (two) times daily.  60 tablet 5  . latanoprost (XALATAN) 0.005 % ophthalmic solution Place 1 drop into both eyes at bedtime.  3  . levothyroxine (SYNTHROID, LEVOTHROID) 100 MCG tablet Take 100 mcg by mouth daily.    Bertram Gala Glycol-Propyl Glycol (SYSTANE OP) Apply 2 drops to eye daily as needed (dry eyes).     . potassium chloride SA (K-DUR,KLOR-CON) 20 MEQ tablet Take 20 mEq by mouth daily.    . timolol (TIMOPTIC) 0.5 % ophthalmic solution Place 1 drop into both eyes every morning.   3  . warfarin (COUMADIN) 1 MG tablet TAKE AS DIRECTED BY COUMADIN CLINIC (Patient taking differently: Takes  everyday except 2.5mg  on Mondays and Friday) 200 tablet 1  . furosemide (LASIX) 40 MG tablet Take 1.5 tablets (60 mg total) by mouth daily.    . sildenafil (REVATIO) 20 MG tablet Take 1 tablet (20 mg total) by mouth 3 (three)  times daily. 90 tablet 5   No facility-administered medications prior to visit.      Allergies:   Penicillins and Sulfa antibiotics   Social History   Social History  . Marital Status: Widowed    Spouse Name: N/A  . Number of Children: N/A  . Years of Education: N/A   Social History Main Topics  . Smoking status: Never Smoker   . Smokeless tobacco: Never Used  . Alcohol Use: No  . Drug Use: No  . Sexual Activity: Not Asked   Other Topics Concern  . None   Social History Narrative     Family History:  The patient's family history includes Cancer in her mother; Heart attack in her father; Heart disease in her sister and sister; Other in her father.   ROS:   Please see the history of present illness.    ROS All other systems reviewed and are negative.   Physical Exam:    VS:  BP 140/60 mmHg  Pulse 82  Ht  (1.626 m)  Wt 154 lb 6.4 oz (70.035 kg)  BMI 26.49 kg/m2  SpO2 98%   Physical Exam  Constitutional: She is oriented to person, place, and time. She appears well-developed and well-nourished. No distress.  HENT:  Head: Normocephalic and atraumatic.  Neck: JVD present.  Cardiovascular: Normal rate.  An irregularly irregular rhythm present.  No murmur heard. Pulmonary/Chest:  Decreased breath sounds with faint crackles in bilateral bases  Musculoskeletal:  1-2+ bilateral LE edema  Neurological: She is alert and oriented to person, place, and time.  Skin: Skin is warm and dry.  Psychiatric: She has a normal mood and affect.    Wt Readings from Last 3 Encounters:  09/11/15 154 lb 6.4 oz (70.035 kg)  08/23/15 149 lb 12.8 oz (67.949 kg)  08/13/15 147 lb 11.3 oz (67 kg)      Studies/Labs Reviewed:     EKG:  EKG is not ordered today.  The ekg ordered today demonstrates n/a  Recent Labs: 06/10/2015: TSH 1.325 08/11/2015: ALT 16 08/12/2015: B Natriuretic Peptide 201.3* 08/13/2015: Magnesium 1.8 08/23/2015: BUN 20; Creat 1.38*; Hemoglobin 11.0*; Platelets  232; Potassium 4.3; Sodium 142   Recent Lipid Panel No results found for: CHOL, TRIG, HDL, CHOLHDL, VLDL, LDLCALC, LDLDIRECT  Additional studies/ records that were reviewed today include:   Echo 06/04/15 - Left ventricle: The cavity size was normal. Wall thickness was  increased in a pattern of mild LVH. The estimated ejection  fraction was 55%. Wall motion was normal; there were no regional  wall motion abnormalities.  The study is not technically  sufficient to allow evaluation of LV diastolic function. - Aortic valve: Trileaflet; mildly calcified leaflets. There was  moderate low gradient stenosis. There was trivial regurgitation.  Mean gradient (S): 10 mm Hg. Peak gradient (S): 21 mm Hg. Peak  velocity ratio of LVOT to aortic valve: 0.33. Valve area (Vmax):  1.15 cm^2. - Mitral valve: Calcified annulus. There was mild regurgitation. - Left atrium: The atrium was severely dilated. - Right ventricle: The cavity size was mildly dilated. Systolic  function was moderately reduced. - Right atrium: The atrium was mildly dilated. Central venous  pressure (est): 3 mm Hg. - Tricuspid valve: There was moderate regurgitation. - Pulmonary arteries: Systolic pressure was severely increased. PA  peak pressure: 65 mm Hg (S). - Pericardium, extracardiac: There was no pericardial effusion. Impressions:   Mild LVH with LVEF approximately 55%. Indeterminate diastolic function in the setting of atrial fibrillation. Severe left atrial enlargement. MAC with mild mitral regurgitation. Moderate, low gradient, calcific aortic stenosis. Mildly dilated RV with  moderately reduced contraction. Moderate tricuspid regurgitation with evidence of severe pulmonary hypertension, PASP 65 mmHg.   ASSESSMENT:     1. Acute on chronic diastolic heart failure (HCC)   2. Chronic atrial fibrillation (HCC)   3. Pulmonary hypertension (HCC)   4. Essential hypertension   5. CKD (chronic kidney disease) stage 3,  GFR 30-59 ml/min     PLAN:     In order of problems listed above:  1. A/C Diastolic HF - She was readmitted in 5/17 with AF with RVR in the setting of recurrent pleural effusion with L thoracentesis.  Today, her weight is up some. She does have evidence of volume excess on exam. Her breathing has worsened.   -  Increase Lasix to 80 mg twice a day 1 week. Then reduce dose back to 60 mg twice a day.   -  Obtain follow-up BMET, BNP in one week.  2. Chronic atrial fibrillation - Heart rate better controlled. Continue current dose of diltiazem, labetalol. Continue Coumadin.  3. Pulmonary hypertension - Severe by echocardiogram 3/17. Patient has had significant side effects to Revatio. I discussed this with Dr. Eden Emms, who recommended starting this medication. Given the patient's advanced age and significant side effects, we have decided to discontinue Revatio. I will try her on low dose nitrate therapy to see if she can tolerate. Start isosorbide dinitrate 10 mg twice a day. Follow-up with Dr. Eldridge Dace. Consider repeat echo at some point. She may benefit from follow-up in Pulmonology or CHF clinic if pulmonary pressures remain high.  4. HTN - Fair control. Continue current medical regimen.  5. CKD - Recent creatinine stable. Obtain repeat BMET in one week given increased dose of Lasix.   Medication Adjustments/Labs and Tests Ordered: Current medicines are reviewed at length with the patient today.  Concerns regarding medicines are outlined above.  Medication changes, Labs and Tests ordered today are outlined in the Patient Instructions noted below. Patient Instructions  Medication Instructions:  1. STOP REVATIO 2. START ISORDIL 10 MG 1 TABLET TWICE DAILY 3. INCREASE LASIX TO 80 MG TWICE DAILY FOR 1 WEEK THEN CHANGE TO LASIX 60 MG TWICE DAILY Labwork: BMET, BNP TO BE DONE IN 1 WEEK Testing/Procedures: NONE Follow-Up: DR. VARANASI IN 6-8 WEEKS Any Other Special Instructions Will Be Listed  Below (If Applicable). If you need a refill on your cardiac medications before your next appointment, please call your pharmacy.   Signed, Tereso Newcomer, PA-C  09/11/2015 5:28 PM    [facility] [address], [place], [place]  [address] Phone: [phone]; Fax: [phone]

## 2015-09-11 ENCOUNTER — Encounter: Payer: Self-pay | Admitting: Physician Assistant

## 2015-09-11 ENCOUNTER — Ambulatory Visit (INDEPENDENT_AMBULATORY_CARE_PROVIDER_SITE_OTHER): Payer: Medicare Other | Admitting: Physician Assistant

## 2015-09-11 ENCOUNTER — Other Ambulatory Visit: Payer: Self-pay | Admitting: Interventional Cardiology

## 2015-09-11 VITALS — BP 140/60 | HR 82 | Ht 64.0 in | Wt 154.4 lb

## 2015-09-11 DIAGNOSIS — N184 Chronic kidney disease, stage 4 (severe): Secondary | ICD-10-CM

## 2015-09-11 DIAGNOSIS — I482 Chronic atrial fibrillation, unspecified: Secondary | ICD-10-CM

## 2015-09-11 DIAGNOSIS — I10 Essential (primary) hypertension: Secondary | ICD-10-CM | POA: Diagnosis not present

## 2015-09-11 DIAGNOSIS — I5033 Acute on chronic diastolic (congestive) heart failure: Secondary | ICD-10-CM | POA: Diagnosis not present

## 2015-09-11 DIAGNOSIS — I701 Atherosclerosis of renal artery: Secondary | ICD-10-CM

## 2015-09-11 DIAGNOSIS — N183 Chronic kidney disease, stage 3 unspecified: Secondary | ICD-10-CM

## 2015-09-11 DIAGNOSIS — I272 Other secondary pulmonary hypertension: Secondary | ICD-10-CM | POA: Diagnosis not present

## 2015-09-11 HISTORY — DX: Chronic kidney disease, stage 4 (severe): N18.4

## 2015-09-11 MED ORDER — ISOSORBIDE DINITRATE 10 MG PO TABS: 10.0000 mg | ORAL_TABLET | Freq: Two times a day (BID) | ORAL | Status: DC

## 2015-09-11 MED ORDER — FUROSEMIDE 40 MG PO TABS: 60.0000 mg | ORAL_TABLET | Freq: Two times a day (BID) | ORAL | Status: DC

## 2015-09-11 NOTE — Patient Instructions (Addendum)
Medication Instructions:  1. STOP REVATIO 2. START ISORDIL 10 MG 1 TABLET TWICE DAILY 3. INCREASE LASIX TO 80 MG TWICE DAILY FOR 1 WEEK THEN CHANGE TO LASIX 60 MG TWICE DAILY Labwork: BMET, BNP TO BE DONE IN 1 WEEK Testing/Procedures: NONE Follow-Up: DR. VARANASI IN 6-8 WEEKS Any Other Special Instructions Will Be Listed Below (If Applicable). If you need a refill on your cardiac medications before your next appointment, please call your pharmacy.

## 2015-09-12 ENCOUNTER — Telehealth: Payer: Self-pay

## 2015-09-12 ENCOUNTER — Other Ambulatory Visit: Payer: Self-pay

## 2015-09-12 MED ORDER — DILTIAZEM HCL ER COATED BEADS 360 MG PO CP24: 360.0000 mg | ORAL_CAPSULE | Freq: Every day | ORAL | Status: DC

## 2015-09-12 NOTE — Telephone Encounter (Signed)
Called in with questions regarding how Isordil will affect her other medications. The pharmacy is concerened about drug interactions and has not filled Rx sent in yesterday. Her niece Wille CelesteJanie is available to speak with per pt. [phone] and would like a call back to discuss this.

## 2015-09-12 NOTE — Telephone Encounter (Signed)
Ok per pt to s/w her neice GhanaJanie. Janie states pharmacy would not fill Isordil due to interaction with other medications. I called pharm and s/w pharmacist who stated they thoguht pt was still going to be taking the Revatio. I advised Revatio d/c'd 6/19. Pharmacist apologized for the problem and will fill order tonight.

## 2015-09-13 ENCOUNTER — Telehealth: Payer: Self-pay | Admitting: Physician Assistant

## 2015-09-13 ENCOUNTER — Telehealth: Payer: Self-pay | Admitting: *Deleted

## 2015-09-13 NOTE — Telephone Encounter (Signed)
called [facility] to have them fill Isordil, according to phone msg 09/12/15 by Danielle Rankincarol fiato, she spoke with them and explained the revatio was d/c'd and [facility] said they would fill it, pt called back and said they were told by [facility] they still would not fill it due to medication interaction, i had to call [facility] to [facility], [facility] and explained AGAIN that the revatio was d/c'd 6/19, he said ok & then proceeded to tell me they were out of Isordil, had ordered it today and would fill it when they supply came in, I ask him to call and explain to pt what was going on, he agreed.

## 2015-09-13 NOTE — Telephone Encounter (Signed)
Patient's HH RN called to get verbal orders for labs scheduled 6/30 so they can be drawn at home. Gave verbal orders for BNP and BMET with results to be faxed to the office attn: Okey Regalarol. Lab appointment cancelled. Tresa EndoKelly was grateful for assistance.

## 2015-09-13 NOTE — Telephone Encounter (Signed)
New MEssage  RN from [facility] calling tospeak w/ rN about verbal orders for lab work to be done @ home. Please call back and discuss.

## 2015-09-14 ENCOUNTER — Ambulatory Visit (INDEPENDENT_AMBULATORY_CARE_PROVIDER_SITE_OTHER): Payer: Medicare Other | Admitting: Internal Medicine

## 2015-09-14 DIAGNOSIS — Z5181 Encounter for therapeutic drug level monitoring: Secondary | ICD-10-CM

## 2015-09-14 DIAGNOSIS — I482 Chronic atrial fibrillation, unspecified: Secondary | ICD-10-CM

## 2015-09-14 LAB — POCT INR: INR: 2.2

## 2015-09-18 ENCOUNTER — Other Ambulatory Visit: Payer: Medicare Other

## 2015-09-21 ENCOUNTER — Telehealth: Payer: Self-pay | Admitting: Interventional Cardiology

## 2015-09-21 ENCOUNTER — Telehealth: Payer: Self-pay | Admitting: Physician Assistant

## 2015-09-21 DIAGNOSIS — I5033 Acute on chronic diastolic (congestive) heart failure: Secondary | ICD-10-CM

## 2015-09-21 MED ORDER — FUROSEMIDE 40 MG PO TABS: 80.0000 mg | ORAL_TABLET | Freq: Two times a day (BID) | ORAL | Status: DC

## 2015-09-21 NOTE — Telephone Encounter (Signed)
New message       Calling to give abn lab results

## 2015-09-21 NOTE — Telephone Encounter (Signed)
error 

## 2015-09-21 NOTE — Telephone Encounter (Signed)
Patient of Dr. Lesia SagoVaranasi's, [facility] home health nurse- Neysa BonitoChristy reporting abnormal labs-  BNP 6450, BUN 25, and Creatinine 1.30. Neysa BonitoChristy will be faxing the whole report of labs to our office. Consulted Dr. Eldridge DaceVaranasi, advised to increase lasix 80 mg by mouth twice daily and repeat labs in one week. Neysa BonitoChristy will contact patient with changes and will fax future lab results to our office.

## 2015-09-22 ENCOUNTER — Other Ambulatory Visit: Payer: Medicare Other

## 2015-09-22 ENCOUNTER — Encounter: Payer: Self-pay | Admitting: Interventional Cardiology

## 2015-09-29 ENCOUNTER — Telehealth: Payer: Self-pay | Admitting: Interventional Cardiology

## 2015-09-29 ENCOUNTER — Encounter: Payer: Self-pay | Admitting: Interventional Cardiology

## 2015-09-29 ENCOUNTER — Ambulatory Visit (INDEPENDENT_AMBULATORY_CARE_PROVIDER_SITE_OTHER): Payer: Medicare Other | Admitting: Cardiovascular Disease

## 2015-09-29 DIAGNOSIS — I482 Chronic atrial fibrillation, unspecified: Secondary | ICD-10-CM

## 2015-09-29 DIAGNOSIS — Z5181 Encounter for therapeutic drug level monitoring: Secondary | ICD-10-CM

## 2015-09-29 LAB — PROTIME-INR: INR: 1.8 — AB (ref 0.9–1.1)

## 2015-09-29 NOTE — Telephone Encounter (Signed)
New message      Calling to get 1 visit for next week for recertification. Also, did we receive labs faxed to us last week from nurse?

## 2015-09-29 NOTE — Telephone Encounter (Signed)
Tonya SimplerKaren Boone, the pts caregiver, (pt gave permission to talk to [name]) is advised of the pts lab results and need to be seen in OV early next week. Appt scheduled for 7/11 at 11:30 with [name], PA-c and [name] states that she will call back if they cant keep that appt.

## 2015-09-29 NOTE — Telephone Encounter (Addendum)
**Note De-Identified Jonica Bickhart Obfuscation** LMTCB. Dr Eldridge DaceVaranasi agreed to sign orders for the pt to have PT twice a week for 1 month on 09/01/15 because the pts PCP did not feel PT was necessary. Per Dr Eldridge DaceVaranasi the pt needs to see her PCP for recertification .

## 2015-09-29 NOTE — Telephone Encounter (Signed)
New Message    [facility] for [facility]. Please call back to discuss

## 2015-09-29 NOTE — Telephone Encounter (Signed)
Will forward to Coumadin clinic  

## 2015-09-29 NOTE — Telephone Encounter (Signed)
Per Azalee CourseHao Meng, PA-c the pts lab results are worse than last weeks even with increased Lasix dose of 80 mg BID. He recommends that she be seen in OV next week. LMTCB.

## 2015-09-29 NOTE — Telephone Encounter (Signed)
See anticoag encounter

## 2015-10-02 NOTE — Progress Notes (Signed)
Cardiology Office Note:    Date:  10/03/2015   ID:  [name], DOB 09-07-1919, MRN [id number]  PCP:  Lorenda Peck, MD  Cardiologist:  Dr. Everette Rank   Electrophysiologist:  n/a  Referring MD: Burton Apley, MD   Chief Complaint  Patient presents with  . Congestive Heart Failure    History of Present Illness:     Tonya Farley is a 82 y.o. female with a hx of RA stenosis, HTN, chronic AF, Coumadin anticoagulation, hypothyroidism. Admitted in 3/17 with pneumonia and bilateral pleural effusions s/p R thoracentesis. Echo in 3/17 with normal EF, mild to mod AS, some RV failure and PASP 65 mmHg. She was admitted again in 5/17 with AF with RVR in the setting of recurrent left effusion. She underwent left-sided thoracentesis. It was not clear that this was cardiac related. Lasix was adjusted. Given her pulmonary hypertension, vasodilator therapy was recommended by Dr. Eden Emms. Revatio 20 mg 3 times a day was recommended. Labetalol dose was adjusted for rate control. She was seen by Nada Boozer, NP in FU on 08/23/15. HR was better controlled. She was not yet on Revatio.   I saw her 09/11/15 for follow-up. She demonstrated evidence of volume excess. I adjusted her Lasix. I discussed her case further with Dr. Eden Emms who had recommended Revatio. Given the cost and side effects she experienced after starting it, we decided to discontinue this drug. I placed her on low-dose nitrates for vasodilatory effect.  Follow-up labs per [facility] last month demonstrated an elevated BNP at 6450. Lasix was adjusted. Repeat labs 09/28/15: Sodium 142, potassium 4.1, BUN 29, creatinine 1.3, proBNP 7310. Given the increase in the patient's BNP, follow up today is recommended.  She is here today with her niece. She notes continued dyspnea with exertion. She continues to have LE edema. However, her edema is improved. She denies orthopnea or PND. Denies chest discomfort or syncope. She does  wheeze.  Past Medical History  Diagnosis Date  . Hypertension   . Hyperlipidemia   . Thyroid disease   . Hypothyroidism   . Chronic back pain   . Chronic a-fib (HCC)   . PNA (pneumonia)   . Chronic diastolic (congestive) heart failure (HCC)     a. Echo 3/17: mild LVH, EF 55%, no RWMA, mod AS (mean 10 mmHg, peak 21 mmHg), MAC, mild MR, severe LAE, mod reduced RVSF, mild RAE, mod TR, PASP 65 mmHg  . Pulmonary hypertension (HCC)     PASP 65 mmHg at Echo 3/17  . CKD (chronic kidney disease) stage 3, GFR 30-59 ml/min 09/11/2015    Past Surgical History  Procedure Laterality Date  . Appendectomy    . Tonsillectomy    . Cataracts      bilateral  . Eye surgery      left eye "hole"    Current Medications: Outpatient Prescriptions Prior to Visit  Medication Sig Dispense Refill  . atorvastatin (LIPITOR) 10 MG tablet Take 10 mg by mouth daily.    . B Complex-C (B-COMPLEX WITH VITAMIN C) tablet Take 1 tablet by mouth daily.    . brimonidine (ALPHAGAN) 0.15 % ophthalmic solution Place 1 drop into both eyes 3 (three) times daily.    . Cholecalciferol (VITAMIN D PO) Take 1 tablet by mouth daily.     Marland Kitchen diltiazem (CARDIZEM CD) 360 MG 24 hr capsule Take 1 capsule (360 mg total) by mouth daily. 90 capsule 1  . isosorbide dinitrate (ISORDIL) 10 MG tablet Take  1 tablet (10 mg total) by mouth 2 (two) times daily. 60 tablet 11  . labetalol (NORMODYNE) 200 MG tablet Take 1 tablet (200 mg total) by mouth 2 (two) times daily. 60 tablet 5  . latanoprost (XALATAN) 0.005 % ophthalmic solution Place 1 drop into both eyes at bedtime.  3  . levothyroxine (SYNTHROID, LEVOTHROID) 100 MCG tablet Take 100 mcg by mouth daily.    Bertram Gala Glycol-Propyl Glycol (SYSTANE OP) Apply 2 drops to eye daily as needed (dry eyes).     . potassium chloride SA (K-DUR,KLOR-CON) 20 MEQ tablet Take 20 mEq by mouth daily.    . timolol (TIMOPTIC) 0.5 % ophthalmic solution Place 1 drop into both eyes every morning.   3  .  budesonide (PULMICORT) 0.5 MG/2ML nebulizer solution Take 2 mLs (0.5 mg total) by nebulization 2 (two) times daily. (Patient not taking: Reported on 10/03/2015)    . furosemide (LASIX) 40 MG tablet Take 2 tablets (80 mg total) by mouth 2 (two) times daily. (Patient not taking: Reported on 10/03/2015) 90 tablet 11  . warfarin (COUMADIN) 1 MG tablet TAKE AS DIRECTED BY COUMADIN CLINIC (Patient not taking: Reported on 10/03/2015) 200 tablet 1   No facility-administered medications prior to visit.      Allergies:   Penicillins and Sulfa antibiotics   Social History   Social History  . Marital Status: Widowed    Spouse Name: N/A  . Number of Children: N/A  . Years of Education: N/A   Social History Main Topics  . Smoking status: Never Smoker   . Smokeless tobacco: Never Used  . Alcohol Use: No  . Drug Use: No  . Sexual Activity: Not Asked   Other Topics Concern  . None   Social History Narrative     Family History:  The patient's family history includes Cancer in her mother; Heart attack in her father; Heart disease in her sister and sister; Other in her father.   ROS:   Please see the history of present illness.    Review of Systems  Constitution: Positive for malaise/fatigue.  Cardiovascular: Positive for irregular heartbeat and leg swelling.  Respiratory: Positive for wheezing.   Musculoskeletal: Positive for joint pain and joint swelling.  Neurological: Positive for loss of balance.   All other systems reviewed and are negative.   Physical Exam:    VS:  BP 150/70 mmHg  Pulse 80  Ht 5\' 4"  (1.626 m)  Wt 150 lb 3.2 oz (68.13 kg)  BMI 25.77 kg/m2  SpO2 96%   Physical Exam  Constitutional: She is oriented to person, place, and time. She appears well-developed and well-nourished. No distress.  HENT:  Head: Normocephalic and atraumatic.  Neck:  JVP 7-8 cm  Cardiovascular: S1 normal and S2 normal.  An irregularly irregular rhythm present.  No murmur  heard. Pulmonary/Chest: She has decreased breath sounds.  Bibasilar crackles  Abdominal: Soft. There is no tenderness.  Musculoskeletal:  1-2+ bilateral LE edema up to the knees  Neurological: She is alert and oriented to person, place, and time.  Skin: Skin is warm and dry.  Psychiatric: She has a normal mood and affect.    Wt Readings from Last 3 Encounters:  10/03/15 150 lb 3.2 oz (68.13 kg)  09/11/15 154 lb 6.4 oz (70.035 kg)  08/23/15 149 lb 12.8 oz (67.949 kg)     Studies/Labs Reviewed:     EKG:  EKG is   ordered today.  The ekg ordered today demonstrates Atrial  fibrillation, HR 80, left axis deviation, anterolateral Q waves, QTc 431 ms, no change from prior tracing  Recent Labs: 06/10/2015: TSH 1.325 08/11/2015: ALT 16 08/12/2015: B Natriuretic Peptide 201.3* 08/13/2015: Magnesium 1.8 08/23/2015: BUN 20; Creat 1.38*; Hemoglobin 11.0*; Platelets 232; Potassium 4.3; Sodium 142   Recent Lipid Panel No results found for: CHOL, TRIG, HDL, CHOLHDL, VLDL, LDLCALC, LDLDIRECT  Additional studies/ records that were reviewed today include:   Echo 06/04/15 - Left ventricle: The cavity size was normal. Wall thickness was  increased in a pattern of mild LVH. The estimated ejection  fraction was 55%. Wall motion was normal; there were no regional  wall motion abnormalities. The study is not technically  sufficient to allow evaluation of LV diastolic function. - Aortic valve: Trileaflet; mildly calcified leaflets. There was  moderate low gradient stenosis. There was trivial regurgitation.  Mean gradient (S): 10 mm Hg. Peak gradient (S): 21 mm Hg. Peak  velocity ratio of LVOT to aortic valve: 0.33. Valve area (Vmax):  1.15 cm^2. - Mitral valve: Calcified annulus. There was mild regurgitation. - Left atrium: The atrium was severely dilated. - Right ventricle: The cavity size was mildly dilated. Systolic  function was moderately reduced. - Right atrium: The atrium was mildly  dilated. Central venous  pressure (est): 3 mm Hg. - Tricuspid valve: There was moderate regurgitation. - Pulmonary arteries: Systolic pressure was severely increased. PA  peak pressure: 65 mm Hg (S). - Pericardium, extracardiac: There was no pericardial effusion. Impressions: Mild LVH with LVEF approximately 55%. Indeterminate diastolic function in the setting of atrial fibrillation. Severe left atrial enlargement. MAC with mild mitral regurgitation. Moderate, low gradient, calcific aortic stenosis. Mildly dilated RV with  moderately reduced contraction. Moderate tricuspid regurgitation with evidence of severe pulmonary hypertension, PASP 65 mmHg.  ASSESSMENT:     1. Acute on chronic diastolic heart failure (HCC)   2. Chronic atrial fibrillation (HCC)   3. Pulmonary hypertension (HCC)   4. Hypertensive heart disease with heart failure (HCC)   5. CKD (chronic kidney disease) stage 3, GFR 30-59 ml/min     PLAN:     In order of problems listed above:  1. A/C Diastolic HF - NYHA 2b-3.  She essentially has biventricular failure with significant RV dysfunction the setting of pulmonary hypertension as well as diastolic CHF. Recent laboratory data continues to demonstrate significantly elevated BNP. She demonstrates evidence of volume excess on exam. Weight has only decreased about 4 pounds since last seen.  She needs more diuresis.  I did consider changing Furosemide to Torsemide.  She is concerned about cost.  I will therefore augment her diuresis with Furosemide by adding Metolazone.    -  Add metolazone 2.5 mg 1 dose (take with Lasix tomorrow)  -  Take 1 extra dose of potassium 20 mEq tomorrow  -  In 2 days, adjust Lasix to 80 mg twice a day  -  BMET in 2 days  -  BMET in one week  -  Follow-up with me in 2 weeks  2. Chronic atrial fibrillation - Heart rate controlled. Continue current dose of diltiazem, labetalol. Continue Coumadin.  3. Pulmonary hypertension - Severe by  echocardiogram 3/17. Patient had significant side effects to Revatio.  This was DC'd and she was put on isosorbide dinitrate 10 mg twice a day.    4. HTN - BP above target.  Adjust diuresis as outlined above.    5. CKD - Recent Creatinines stable.  Keep close eye on renal  function and K+ with adjustments in diuretics.     Medication Adjustments/Labs and Tests Ordered: Current medicines are reviewed at length with the patient today.  Concerns regarding medicines are outlined above.  Medication changes, Labs and Tests ordered today are outlined in the Patient Instructions noted below. Patient Instructions  Medication Instructions:  1. AN RX FOR METOLAZONE 2.5 MG HAS BEEN SENT IN; DIRECTIONS TO READ TAKE 1 TABLET 2.5 MG ONLY TOMORROW ON 10/04/15; MAKE SURE TO TAKE THIS 30 MINUTES BEFORE MORNING DOSE OF LASIX 2. YOU WILL NEED TO TAKE AN 20 MEQ POTASSIUM TOMORROW ONLY WHEN YOU TAKE THE METOLAZONE 3. ON 10/05/15 YOU WILL INCREASE LASIX TO 80 MG TWICE DAILY Labwork: YOU HAVE BEEN GIVEN AN RX FOR HHRN TO CHECK LAB WORK (BMET, BNP)THIS Thursday 10/05/15 AND TO REPEAT LABS ON 10/12/15 (BMET, BNP) Testing/Procedures: NONE Follow-Up: Lavonta Tillis, PAC  Any Other Special Instructions Will Be Listed Below (If Applicable). If you need a refill on your cardiac medications before your next appointment, please call your pharmacy.   Signed, Tereso Newcomer, PA-C  10/03/2015 12:41 PM    [facility] [address] [place], [place], [place]  [address] Phone: [phone]; Fax: [phone]

## 2015-10-03 ENCOUNTER — Ambulatory Visit (INDEPENDENT_AMBULATORY_CARE_PROVIDER_SITE_OTHER): Payer: Medicare Other | Admitting: Physician Assistant

## 2015-10-03 ENCOUNTER — Encounter: Payer: Self-pay | Admitting: Physician Assistant

## 2015-10-03 VITALS — BP 150/70 | HR 80 | Ht 64.0 in | Wt 150.2 lb

## 2015-10-03 DIAGNOSIS — I482 Chronic atrial fibrillation, unspecified: Secondary | ICD-10-CM

## 2015-10-03 DIAGNOSIS — I272 Other secondary pulmonary hypertension: Secondary | ICD-10-CM

## 2015-10-03 DIAGNOSIS — I110 Hypertensive heart disease with heart failure: Secondary | ICD-10-CM | POA: Diagnosis not present

## 2015-10-03 DIAGNOSIS — I5033 Acute on chronic diastolic (congestive) heart failure: Secondary | ICD-10-CM | POA: Diagnosis not present

## 2015-10-03 DIAGNOSIS — N183 Chronic kidney disease, stage 3 unspecified: Secondary | ICD-10-CM

## 2015-10-03 DIAGNOSIS — I701 Atherosclerosis of renal artery: Secondary | ICD-10-CM

## 2015-10-03 MED ORDER — METOLAZONE 2.5 MG PO TABS: 2.5000 mg | ORAL_TABLET | Freq: Once | ORAL | Status: DC

## 2015-10-03 MED ORDER — FUROSEMIDE 80 MG PO TABS: 80.0000 mg | ORAL_TABLET | Freq: Two times a day (BID) | ORAL | Status: DC

## 2015-10-03 NOTE — Patient Instructions (Addendum)
Medication Instructions:  1. AN RX FOR METOLAZONE 2.5 MG HAS BEEN SENT IN; DIRECTIONS TO READ TAKE 1 TABLET 2.5 MG ONLY TOMORROW ON 10/04/15; MAKE SURE TO TAKE THIS 30 MINUTES BEFORE MORNING DOSE OF LASIX 2. YOU WILL NEED TO TAKE AN 20 MEQ POTASSIUM TOMORROW ONLY WHEN YOU TAKE THE METOLAZONE 3. ON 10/05/15 YOU WILL INCREASE LASIX TO 80 MG TWICE DAILY Labwork: YOU HAVE BEEN GIVEN AN RX FOR HHRN TO CHECK LAB WORK (BMET, BNP)THIS Thursday 10/05/15 AND TO REPEAT LABS ON 10/12/15 (BMET, BNP) Testing/Procedures: NONE Follow-Up: [name], PAC  Any Other Special Instructions Will Be Listed Below (If Applicable). If you need a refill on your cardiac medications before your next appointment, please call your pharmacy.

## 2015-10-05 ENCOUNTER — Telehealth: Payer: Self-pay | Admitting: Interventional Cardiology

## 2015-10-05 NOTE — Telephone Encounter (Signed)
New message      Calling to see if the PA (the one pt saw Tuesday) will ok for pt to continue home health.  Nurse has to discharge her today unless we will ok for it to be continued.  Please call

## 2015-10-05 NOTE — Telephone Encounter (Signed)
I will forward to [name], PA for further advice.  

## 2015-10-05 NOTE — Telephone Encounter (Signed)
Per [name], PA [facility] Per Bing NeighborsScott W. PA this is to be under Dr. Eldridge DaceVaranasi and [name], PA. I s/w [name] who has been notified to continue. HHRN stated [facility] and will send paperwork for frequency to office.

## 2015-10-05 NOTE — Telephone Encounter (Signed)
Yes.  She can continue.  Put under Dr. Everette RankJay Varanasi and me. Tereso NewcomerScott Alger Kerstein, PA-C   10/05/2015 12:50 PM

## 2015-10-05 NOTE — Telephone Encounter (Signed)
New message      The home care nurse drew labs today and needs to go over results with a nurse, please  Attention to the BNP has increased

## 2015-10-05 NOTE — Telephone Encounter (Signed)
New message    The home health nurse is heading to the pt's house to do the discharge   The question is does Dr. Eldridge DaceVaranasi want the home health nurse to still go out and see the pt   The home health nurse spoke with the pt's primary care physician and the Md said to discharge, if Dr. Eldridge DaceVaranasi gives the ok to continue the home heallth agancy needs a order and needs for Dr. Eldridge DaceVaranasi to sign on for the care of the pt. Please advise

## 2015-10-05 NOTE — Telephone Encounter (Signed)
RN w/ [facility] calling to inform RN- pt recorded high Creatinine and BUN, RN is faxing results now. Please advise

## 2015-10-06 ENCOUNTER — Encounter: Payer: Self-pay | Admitting: Interventional Cardiology

## 2015-10-09 ENCOUNTER — Ambulatory Visit (INDEPENDENT_AMBULATORY_CARE_PROVIDER_SITE_OTHER): Payer: Medicare Other | Admitting: Internal Medicine

## 2015-10-09 ENCOUNTER — Other Ambulatory Visit: Payer: Self-pay | Admitting: *Deleted

## 2015-10-09 ENCOUNTER — Telehealth: Payer: Self-pay | Admitting: Physician Assistant

## 2015-10-09 DIAGNOSIS — I482 Chronic atrial fibrillation, unspecified: Secondary | ICD-10-CM

## 2015-10-09 DIAGNOSIS — Z5181 Encounter for therapeutic drug level monitoring: Secondary | ICD-10-CM

## 2015-10-09 LAB — POCT INR: INR: 2.3

## 2015-10-09 MED ORDER — METOLAZONE 2.5 MG PO TABS: 2.5000 mg | ORAL_TABLET | Freq: Once | ORAL | Status: DC

## 2015-10-09 NOTE — Telephone Encounter (Signed)
Not sure if Dr. Eldridge DaceVaranasi sent this message to his nurse. In any event, Creatinine stable on labs but, BNP remains too high Continue taking Metolazone one day a week. BMET in 2 weeks. Tereso NewcomerScott Torben Soloway, PA-C   10/09/2015 3:28 PM

## 2015-10-09 NOTE — Telephone Encounter (Signed)
See lab results from 10/05/15 scanned document. Notes Recorded by Debbe Balesanielle R Gonzalez on 10/09/2015 at 4:30 PM Pt is aware of lab results. Will continue Lasix (80 mg ) bid and is aware of treatment plan to take Metolazone (2.5) mg weekly if needed for sob. Talked with pt's nurse Nicolaus LionsKelly O'brien, LPN and will be drawing a bmp on July 31 and will be faxing results to our office. Also s/w [name] another nurse who visits pt to let her know our fax number. Metolazone changed in medication list. Notes Recorded by Corky CraftsJayadeep S Varanasi, MD on 10/09/2015 at 8:21 AM Cr stable. BNP increased. Continue Lasix 80 mg BID. Can take metolazone 2.5 mg once a week prn shortness of breath. She needs a f/u BMet in 2 weeks.

## 2015-10-09 NOTE — Telephone Encounter (Signed)
-----   Message from Corky CraftsJayadeep S Varanasi, MD sent at 10/09/2015 11:20 AM EDT ----- Cr was stable and BNP was up on 7/14.  She may need metolazone once a week. ----- Message -----    From: Beatrice LecherScott T Weaver, PA-C    Sent: 10/03/2015  12:41 PM      To: Corky CraftsJayadeep S Varanasi, MD

## 2015-10-10 ENCOUNTER — Encounter: Payer: Self-pay | Admitting: *Deleted

## 2015-10-10 NOTE — Telephone Encounter (Signed)
See lab results from 7/13.

## 2015-10-12 ENCOUNTER — Telehealth: Payer: Self-pay | Admitting: Interventional Cardiology

## 2015-10-12 NOTE — Telephone Encounter (Signed)
LM to call back.

## 2015-10-12 NOTE — Telephone Encounter (Signed)
[name] [name].  States needs approval to have orders for nurse visit 1 x a week for 8 weeks and then 5 visits as prn. Advised that [name],PA will not be in office until next week. She states that will be fine and for someone to call her back first of next week.

## 2015-10-12 NOTE — Telephone Encounter (Signed)
New message    Per [name] pt frequency once a week for 8 weeks, and 5 visits as needed. Please call.

## 2015-10-13 ENCOUNTER — Encounter: Payer: Self-pay | Admitting: Interventional Cardiology

## 2015-10-13 NOTE — Telephone Encounter (Signed)
Ok to approve home health order as stated Tereso NewcomerScott Jadelyn Elks, PA-C   10/13/2015 9:52 AM

## 2015-10-13 NOTE — Telephone Encounter (Signed)
I s/w HHRN in regards to [facility] for [facility]. Gave [facility] ok per Bing NeighborsScott W. PA to approve nurse visit 1 x weekly w/ 8 prn visits as well.  HHRN will fax over order req to be signed.

## 2015-10-13 NOTE — Telephone Encounter (Signed)
[facility] approved and tcb with a afax # and I will be happy to fax over order per Bing NeighborsScott W. PA..

## 2015-10-16 ENCOUNTER — Encounter: Payer: Self-pay | Admitting: Interventional Cardiology

## 2015-10-24 ENCOUNTER — Ambulatory Visit (INDEPENDENT_AMBULATORY_CARE_PROVIDER_SITE_OTHER): Payer: Medicare Other | Admitting: Interventional Cardiology

## 2015-10-24 DIAGNOSIS — Z5181 Encounter for therapeutic drug level monitoring: Secondary | ICD-10-CM

## 2015-10-24 DIAGNOSIS — I482 Chronic atrial fibrillation, unspecified: Secondary | ICD-10-CM

## 2015-10-24 LAB — POCT INR: INR: 1.9

## 2015-10-26 ENCOUNTER — Ambulatory Visit: Payer: Medicare Other | Admitting: Interventional Cardiology

## 2015-10-26 NOTE — Progress Notes (Signed)
Cardiology Office Note:    Date:  10/27/2015   ID:  Tonya Farley, DOB 1919/04/06, MRN [id number]  PCP:  Lorenda Peck, MD  Cardiologist:  Dr. Everette Rank   Electrophysiologist:  n/a  Referring MD: Burton Apley, MD   Chief Complaint  Patient presents with  . Congestive Heart Failure    follow up    History of Present Illness:    Tonya Farley is a 82 y.o. female with a hx of RA stenosis, HTN, chronic AF, Coumadin anticoagulation, hypothyroidism. Admitted in 3/17 with pneumonia and bilateral pleural effusions s/p R thoracentesis. Echo in 3/17 with normal EF, mild to mod AS, some RV failure and PASP 65 mmHg. She was admitted again in 5/17 with AF with RVR in the setting of recurrent left effusion. She underwent left-sided thoracentesis. It was not clear that this was cardiac related. Lasix was adjusted. Given her pulmonary hypertension, vasodilator therapy was recommended by Dr. Eden Emms. Revatio 20 mg 3 times a day was recommended. Labetalol dose was adjusted for rate control. She had to stop Revatio due to side effects and cost.  Last seen 10/03/15.  I added Metolazone due to persistent volume excess.  Recent labs 10/13/15 with improved BNP (7310 >> 505).  K 4.5, BUN 32, SCr 1.34  She returns for follow-up. She has lost 12 pounds since her last visit. She has mistakenly been taking metolazone every day instead of once a week. She denies dizziness or near syncope. She denies chest discomfort. Breathing is improved. She denies orthopnea, PND. LE edema has resolved.   Prior CV studies that were reviewed today include:    Echo 06/04/15 Mild LVH, EF 55%, no RWMA, mod AS (mean 10 mmHg, peak 21 mmHg), MAC, mild MR, severe LAE, mod reduced RVSF, mild RAE, mod TR, PASP 65 mmHg  Past Medical History:  Diagnosis Date  . Chronic a-fib (HCC)   . Chronic back pain   . Chronic diastolic (congestive) heart failure (HCC)    a. Echo 3/17: mild LVH, EF 55%, no RWMA, mod AS (mean  10 mmHg, peak 21 mmHg), MAC, mild MR, severe LAE, mod reduced RVSF, mild RAE, mod TR, PASP 65 mmHg  . CKD (chronic kidney disease) stage 3, GFR 30-59 ml/min 09/11/2015  . Hyperlipidemia   . Hypertension   . Hypothyroidism   . PNA (pneumonia)   . Pulmonary hypertension (HCC)    PASP 65 mmHg at Echo 3/17  . Thyroid disease     Past Surgical History:  Procedure Laterality Date  . APPENDECTOMY    . cataracts     bilateral  . EYE SURGERY     left eye "hole"  . TONSILLECTOMY      Current Medications:   Current Meds  Medication Sig  . atorvastatin (LIPITOR) 10 MG tablet Take 10 mg by mouth daily.  . B Complex-C (B-COMPLEX WITH VITAMIN C) tablet Take 1 tablet by mouth daily.  . brimonidine (ALPHAGAN) 0.15 % ophthalmic solution Place 1 drop into both eyes 3 (three) times daily.  . Cholecalciferol (VITAMIN D PO) Take 1 tablet by mouth daily.   Marland Kitchen diltiazem (CARDIZEM CD) 360 MG 24 hr capsule Take 1 capsule (360 mg total) by mouth daily.  . furosemide (LASIX) 80 MG tablet Take 1 tablet (80 mg total) by mouth 2 (two) times daily.  . isosorbide dinitrate (ISORDIL) 10 MG tablet Take 1 tablet (10 mg total) by mouth 2 (two) times daily.  Marland Kitchen labetalol (NORMODYNE) 200 MG tablet  Take 1 tablet (200 mg total) by mouth 2 (two) times daily.  Marland Kitchen latanoprost (XALATAN) 0.005 % ophthalmic solution Place 1 drop into both eyes at bedtime.  Marland Kitchen levothyroxine (SYNTHROID, LEVOTHROID) 100 MCG tablet Take 100 mcg by mouth daily.  . metolazone (ZAROXOLYN) 2.5 MG tablet Take 1 tablet (2.5 mg total) by mouth as needed (take if weight goes up 3 lbs in 1 day or 5 lbs in 1 week).  Bertram Gala Glycol-Propyl Glycol (SYSTANE OP) Apply 2 drops to eye daily as needed (dry eyes).   . potassium chloride SA (K-DUR,KLOR-CON) 20 MEQ tablet Take 20 mEq by mouth daily.  . timolol (TIMOPTIC) 0.5 % ophthalmic solution Place 1 drop into both eyes every morning.   . warfarin (COUMADIN) 1 MG tablet Takes 2 mg by mouth everyday except 2.5 mg  on Mondays and Friday  . [DISCONTINUED] metolazone (ZAROXOLYN) 2.5 MG tablet Take 2.5 mg by mouth daily.     Allergies:   Penicillins and Sulfa antibiotics   Social History   Social History  . Marital status: Widowed    Spouse name: N/A  . Number of children: N/A  . Years of education: N/A   Social History Main Topics  . Smoking status: Never Smoker  . Smokeless tobacco: Never Used  . Alcohol use No  . Drug use: No  . Sexual activity: Not Asked   Other Topics Concern  . None   Social History Narrative  . None     Family History:  The patient's family history includes Cancer in her mother; Heart attack in her father; Heart disease in her sister and sister; Other in her father.   ROS:   Please see the history of present illness.    ROS All other systems reviewed and are negative.   EKGs/Labs/Other Test Reviewed:    EKG:  EKG is  ordered today.  The ekg ordered today demonstrates AFib, HR 99, IVCD, LAFB  Recent Labs: 06/10/2015: TSH 1.325 08/11/2015: ALT 16 08/12/2015: B Natriuretic Peptide 201.3 08/13/2015: Magnesium 1.8 08/23/2015: BUN 20; Creat 1.38; Hemoglobin 11.0; Platelets 232; Potassium 4.3; Sodium 142   Recent Lipid Panel No results found for: CHOL, TRIG, HDL, CHOLHDL, VLDL, LDLCALC, LDLDIRECT   Physical Exam:    VS:  BP 138/62   Pulse 99   Ht 5\' 4"  (1.626 m)   Wt 138 lb (62.6 kg)   BMI 23.69 kg/m     Wt Readings from Last 3 Encounters:  10/27/15 138 lb (62.6 kg)  10/03/15 150 lb 3.2 oz (68.1 kg)  09/11/15 154 lb 6.4 oz (70 kg)     Physical Exam  Constitutional: She is oriented to person, place, and time. She appears well-developed and well-nourished.  HENT:  Head: Normocephalic and atraumatic.  Eyes: No scleral icterus.  Neck: Normal range of motion. No JVD present.  Cardiovascular: Normal rate, S1 normal and S2 normal.  An irregularly irregular rhythm present. Exam reveals no gallop and no friction rub.   No murmur heard. Pulmonary/Chest:  Breath sounds normal. She has no wheezes. She has no rhonchi. She has no rales.  Abdominal: Soft. There is no tenderness.  Musculoskeletal: She exhibits edema.  Trace bilateral ankle edema  Neurological: She is alert and oriented to person, place, and time.  Skin: Skin is warm and dry.  Psychiatric: She has a normal mood and affect.    ASSESSMENT:    1. Chronic diastolic HF (heart failure) (HCC)   2. Chronic atrial fibrillation (HCC)  3. Pulmonary hypertension (HCC)   4. Hypertensive heart disease with heart failure (HCC)   5. CKD (chronic kidney disease) stage 3, GFR 30-59 ml/min   6. Acute on chronic diastolic heart failure (HCC)    PLAN:    In order of problems listed above:  1. Chronic Diastolic HF -  She has had significant diuresis with metolazone in addition to Lasix. She mistakenly took the metolazone on a daily basis. She does feel weak but denies dizziness. Blood pressure is stable. Her heart rate is somewhat elevated today.  Weight is down 12 pounds.  -  Stat BMET today. Obtain BNP  -  Stop taking metolazone on a daily basis.  -  Weigh daily.   -  Take metolazone when necessary 3 pound weight gain in 24 hours or 5 pounds in one week  -  Hold Lasix this PM  -  If creatinine stable, continue Lasix 80 bid  -  If creatinine increased >> change Lasix to 80 in A and 40 in P starting 8/5  -  Keep FU with Dr. Everette Rank next month  2. Chronic atrial fibrillation - Heart rate with fair control.  Continue current dose of diltiazem, labetalol. Continue Coumadin.  3. Pulmonary hypertension - Severe by echocardiogram 3/17.  She could not tolerate Revatio.  Continue isosorbide dinitrate 10 mg twice a day.    4. HTN - Controlled.    5. CKD - Recent Creatinines stable.  With significant diuresis, I will check BMET today.  If creatinine increased, will adjust Lasix as outlined above.   Medication Adjustments/Labs and Tests Ordered: Current medicines are reviewed at length  with the patient today.  Concerns regarding medicines are outlined above.  Medication changes, Labs and Tests ordered today are outlined in the Patient Instructions noted below. Patient Instructions  Medication Instructions:  STOP Taking Metolazone. I will send in a refill on Metolazone, but I want you to only take it if you need to for weight gain or increased leg swelling (see below). DO NOT TAKE Lasix (furosemide) this afternoon. We will call you with further instructions after you lab work comes back. Labwork: Today - BMET, BNP Testing/Procedures: None  Follow-Up: Dr. Everette Rank in September as planned.  Any Other Special Instructions Will Be Listed Below (If Applicable). 1. Weigh daily. 2. If your weight goes up 3 lbs in 1 day or 5 lbs in 1 week, take Metolazone 2.5 mg that day only and call. If you need a refill on your cardiac medications before your next appointment, please call your pharmacy.   Signed, Tereso Newcomer, PA-C  10/27/2015 11:47 AM    [facility] [address] [place], [place], [place]  [address] Phone: [phone]; Fax: [phone]

## 2015-10-27 ENCOUNTER — Encounter: Payer: Self-pay | Admitting: Physician Assistant

## 2015-10-27 ENCOUNTER — Other Ambulatory Visit: Payer: Self-pay | Admitting: Physician Assistant

## 2015-10-27 ENCOUNTER — Ambulatory Visit (INDEPENDENT_AMBULATORY_CARE_PROVIDER_SITE_OTHER): Payer: Medicare Other | Admitting: Physician Assistant

## 2015-10-27 VITALS — BP 138/62 | HR 99 | Ht 64.0 in | Wt 138.0 lb

## 2015-10-27 DIAGNOSIS — I110 Hypertensive heart disease with heart failure: Secondary | ICD-10-CM | POA: Diagnosis not present

## 2015-10-27 DIAGNOSIS — I5032 Chronic diastolic (congestive) heart failure: Secondary | ICD-10-CM | POA: Diagnosis not present

## 2015-10-27 DIAGNOSIS — I272 Other secondary pulmonary hypertension: Secondary | ICD-10-CM | POA: Diagnosis not present

## 2015-10-27 DIAGNOSIS — I482 Chronic atrial fibrillation, unspecified: Secondary | ICD-10-CM

## 2015-10-27 DIAGNOSIS — N183 Chronic kidney disease, stage 3 unspecified: Secondary | ICD-10-CM

## 2015-10-27 DIAGNOSIS — I701 Atherosclerosis of renal artery: Secondary | ICD-10-CM

## 2015-10-27 DIAGNOSIS — I5033 Acute on chronic diastolic (congestive) heart failure: Secondary | ICD-10-CM

## 2015-10-27 LAB — BASIC METABOLIC PANEL
Anion gap: 13 (ref 5–15)
BUN: 45 mg/dL — ABNORMAL HIGH (ref 6–20)
CALCIUM: 10 mg/dL (ref 8.9–10.3)
CO2: 33 mmol/L — AB (ref 22–32)
CREATININE: 1.79 mg/dL — AB (ref 0.44–1.00)
Chloride: 92 mmol/L — ABNORMAL LOW (ref 101–111)
GFR, EST AFRICAN AMERICAN: 27 mL/min — AB (ref >60)
GFR, EST NON AFRICAN AMERICAN: 23 mL/min — AB (ref >60)
GLUCOSE: 107 mg/dL — AB (ref 65–99)
Potassium: 3.7 mmol/L (ref 3.5–5.1)
Sodium: 138 mmol/L (ref 135–145)

## 2015-10-27 LAB — BRAIN NATRIURETIC PEPTIDE: Brain Natriuretic Peptide: 295 pg/mL — ABNORMAL HIGH (ref <100)

## 2015-10-27 MED ORDER — FUROSEMIDE 80 MG PO TABS: ORAL_TABLET | ORAL | 3 refills | Status: DC

## 2015-10-27 MED ORDER — METOLAZONE 2.5 MG PO TABS: 2.5000 mg | ORAL_TABLET | ORAL | 1 refills | Status: DC | PRN

## 2015-10-27 NOTE — Addendum Note (Signed)
Addended byAlben Spittle, Lorin Picket T on: 10/27/2015 02:11 PM   Modules accepted: Orders

## 2015-10-27 NOTE — Patient Instructions (Addendum)
Medication Instructions:  STOP Taking Metolazone. I will send in a refill on Metolazone, but I want you to only take it if you need to for weight gain or increased leg swelling (see below). DO NOT TAKE Lasix (furosemide) this afternoon. We will call you with further instructions after you lab work comes back. Labwork: Today - BMET, BNP Testing/Procedures: None  Follow-Up: Dr. Everette Rank in September as planned.  Any Other Special Instructions Will Be Listed Below (If Applicable). 1. Weigh daily. 2. If your weight goes up 3 lbs in 1 day or 5 lbs in 1 week, take Metolazone 2.5 mg that day only and call. If you need a refill on your cardiac medications before your next appointment, please call your pharmacy.

## 2015-10-27 NOTE — Telephone Encounter (Signed)
metolazone (ZAROXOLYN) 2.5 MG tablet 20 tablet 1 10/27/2015    Sig - Route: Take 1 tablet (2.5 mg total) by mouth as needed (take if weight goes up 3 lbs in 1 day or 5 lbs in 1 week). - Oral   E-Prescribing Status: Receipt confirmed by pharmacy (10/27/2015 11:36 AM EDT)

## 2015-11-06 ENCOUNTER — Ambulatory Visit (INDEPENDENT_AMBULATORY_CARE_PROVIDER_SITE_OTHER): Payer: Medicare Other | Admitting: Pharmacist

## 2015-11-06 DIAGNOSIS — I482 Chronic atrial fibrillation, unspecified: Secondary | ICD-10-CM

## 2015-11-06 DIAGNOSIS — Z5181 Encounter for therapeutic drug level monitoring: Secondary | ICD-10-CM

## 2015-11-06 LAB — POCT INR: INR: 3.5

## 2015-11-15 ENCOUNTER — Telehealth: Payer: Self-pay | Admitting: Interventional Cardiology

## 2015-11-15 NOTE — Telephone Encounter (Signed)
Tonya Farley states that the pts PCP Tonya. Burton Apleyonald Farley has asked them (Tonya Farley) to refer to Tonya Farley as the pts primary MD as they are only seeing the pt for cardiac reasons.  She states that if this is ok with Tonya Farley he will be responsible for all orders on the pt which she states is all cardiac except for 1 home visit to evaluate the pt for occupational therapy that Tonya Farley did not sign.  Is this ok? Please advise.

## 2015-11-15 NOTE — Telephone Encounter (Signed)
Crissie is calling to see if Dr. Eldridge DaceVaranasi will primary doctor to sign off on Mrs[facility] . Please call

## 2015-11-16 ENCOUNTER — Telehealth: Payer: Self-pay | Admitting: Interventional Cardiology

## 2015-11-16 NOTE — Telephone Encounter (Addendum)
**Note De-Identified Viola Placeres Obfuscation** [name] called me back but she must have been on her cell phone as the call was dropped. I called her back and left a detailed message stating that according to the pts chart it looks like she will need a BNP, CMET, Lipids and a CBCD. I left this offices phone number so she can call me back if she has questions. Also I requested that if she draws these labs to please fax results to us at [phone] with Attn: Dr Eldridge DaceVaranasi on the cover letter.

## 2015-11-16 NOTE — Telephone Encounter (Signed)
[name] states Mrs. Zheng wants to know if she can have her blood work drawn on Monday (11/20/15) for her visit on Tuesday since she is having her INR drawn anyway.

## 2015-11-16 NOTE — Telephone Encounter (Signed)
I left a VM for Tonya Farley to return my call as I am unsure of what lab work she is referring to.

## 2015-11-17 NOTE — Telephone Encounter (Signed)
I am fine to sign cardiac orders.

## 2015-11-20 ENCOUNTER — Ambulatory Visit (INDEPENDENT_AMBULATORY_CARE_PROVIDER_SITE_OTHER): Payer: Medicare Other | Admitting: Pharmacist

## 2015-11-20 ENCOUNTER — Encounter: Payer: Self-pay | Admitting: Interventional Cardiology

## 2015-11-20 DIAGNOSIS — I482 Chronic atrial fibrillation, unspecified: Secondary | ICD-10-CM

## 2015-11-20 DIAGNOSIS — Z5181 Encounter for therapeutic drug level monitoring: Secondary | ICD-10-CM

## 2015-11-20 LAB — POCT INR: INR: 4.1

## 2015-11-22 ENCOUNTER — Other Ambulatory Visit: Payer: Self-pay

## 2015-11-22 ENCOUNTER — Telehealth: Payer: Self-pay | Admitting: Internal Medicine

## 2015-11-22 ENCOUNTER — Telehealth: Payer: Self-pay | Admitting: Physician Assistant

## 2015-11-22 ENCOUNTER — Telehealth: Payer: Self-pay | Admitting: Interventional Cardiology

## 2015-11-22 DIAGNOSIS — I5033 Acute on chronic diastolic (congestive) heart failure: Secondary | ICD-10-CM

## 2015-11-22 DIAGNOSIS — I482 Chronic atrial fibrillation, unspecified: Secondary | ICD-10-CM

## 2015-11-22 DIAGNOSIS — I5032 Chronic diastolic (congestive) heart failure: Secondary | ICD-10-CM

## 2015-11-22 MED ORDER — FUROSEMIDE 80 MG PO TABS: 80.0000 mg | ORAL_TABLET | Freq: Two times a day (BID) | ORAL | 3 refills | Status: DC

## 2015-11-22 NOTE — Telephone Encounter (Signed)
New message  Did Provider receive the lab results   The lipid panel was not taken because pt did not fast,  On Wednesday 11/29/2015 is it okay to draw it then with pt INR

## 2015-11-22 NOTE — Telephone Encounter (Signed)
Follow Up:    Pt says she is returning call from yesterday.she was not sure who called her.

## 2015-11-22 NOTE — Telephone Encounter (Signed)
Pt has appt 9/5 with Dr. Eldridge DaceVaranasi ; looks like fasting lab work will be then.

## 2015-11-22 NOTE — Telephone Encounter (Signed)
Please see lab results from 8/28.

## 2015-11-22 NOTE — Telephone Encounter (Signed)
Wrong provider

## 2015-11-24 ENCOUNTER — Other Ambulatory Visit: Payer: Self-pay | Admitting: Interventional Cardiology

## 2015-11-28 ENCOUNTER — Other Ambulatory Visit: Payer: Medicare Other

## 2015-11-28 ENCOUNTER — Ambulatory Visit (INDEPENDENT_AMBULATORY_CARE_PROVIDER_SITE_OTHER): Payer: Medicare Other | Admitting: Interventional Cardiology

## 2015-11-28 ENCOUNTER — Encounter: Payer: Self-pay | Admitting: Interventional Cardiology

## 2015-11-28 VITALS — BP 140/42 | HR 72 | Ht 63.0 in | Wt 136.8 lb

## 2015-11-28 DIAGNOSIS — I482 Chronic atrial fibrillation, unspecified: Secondary | ICD-10-CM

## 2015-11-28 DIAGNOSIS — Z7901 Long term (current) use of anticoagulants: Secondary | ICD-10-CM | POA: Insufficient documentation

## 2015-11-28 DIAGNOSIS — R0600 Dyspnea, unspecified: Secondary | ICD-10-CM | POA: Diagnosis not present

## 2015-11-28 DIAGNOSIS — I701 Atherosclerosis of renal artery: Secondary | ICD-10-CM | POA: Diagnosis not present

## 2015-11-28 DIAGNOSIS — I5032 Chronic diastolic (congestive) heart failure: Secondary | ICD-10-CM

## 2015-11-28 DIAGNOSIS — I5033 Acute on chronic diastolic (congestive) heart failure: Secondary | ICD-10-CM

## 2015-11-28 DIAGNOSIS — E785 Hyperlipidemia, unspecified: Secondary | ICD-10-CM

## 2015-11-28 LAB — CBC WITH DIFFERENTIAL/PLATELET
BASOS ABS: 0 {cells}/uL (ref 0–200)
BASOS PCT: 0 %
EOS ABS: 252 {cells}/uL (ref 15–500)
EOS PCT: 2 %
HCT: 31.5 % — ABNORMAL LOW (ref 35.0–45.0)
HEMOGLOBIN: 10.3 g/dL — AB (ref 11.7–15.5)
LYMPHS ABS: 1008 {cells}/uL (ref 850–3900)
Lymphocytes Relative: 8 %
MCH: 25.8 pg — AB (ref 27.0–33.0)
MCHC: 32.7 g/dL (ref 32.0–36.0)
MCV: 78.9 fL — ABNORMAL LOW (ref 80.0–100.0)
MPV: 9 fL (ref 7.5–12.5)
Monocytes Absolute: 1134 cells/uL — ABNORMAL HIGH (ref 200–950)
Monocytes Relative: 9 %
NEUTROS ABS: 10206 {cells}/uL — AB (ref 1500–7800)
Neutrophils Relative %: 81 %
Platelets: 295 10*3/uL (ref 140–400)
RBC: 3.99 MIL/uL (ref 3.80–5.10)
RDW: 17 % — ABNORMAL HIGH (ref 11.0–15.0)
WBC: 12.6 10*3/uL — ABNORMAL HIGH (ref 3.8–10.8)

## 2015-11-28 LAB — TSH: TSH: 0.29 m[IU]/L — AB

## 2015-11-28 NOTE — Patient Instructions (Signed)
Medication Instructions:  Same-no changes  Labwork: Today-Lipids/BMET/TSH/BNP  Testing/Procedures: None  Follow-Up: Your physician wants you to follow-up in: 6 months. You will receive a reminder letter in the mail two months in advance. If you don't receive a letter, please call our office to schedule the follow-up appointment.     If you need a refill on your cardiac medications before your next appointment, please call your pharmacy.

## 2015-11-28 NOTE — Progress Notes (Signed)
Cardiology Office Note:    Date:  11/28/2015   ID:  [name], DOB Dec 14, 1919, MRN [id number]  PCP:  Lorenda Peck, MD  Cardiologist:  Dr. Everette Rank   Electrophysiologist:  n/a  Referring MD: Burton Apley, MD   No chief complaint on file.   History of Present Illness:    Tonya Farley is a 82 y.o. female with a hx of RA stenosis, HTN, chronic AF, Coumadin anticoagulation, hypothyroidism. Admitted in 3/17 with pneumonia and bilateral pleural effusions s/p R thoracentesis. Echo in 3/17 with normal EF, mild to mod AS, some RV failure and PASP 65 mmHg. She was admitted again in 5/17 with AF with RVR in the setting of recurrent left effusion. She underwent left-sided thoracentesis. It was not clear that this was cardiac related. Lasix was adjusted. Given her pulmonary hypertension, vasodilator therapy was recommended by Dr. Eden Emms. Revatio 20 mg 3 times a day was recommended. Labetalol dose was adjusted for rate control. She had to stop Revatio due to side effects and cost.   Recent labs 10/13/15 with improved BNP (7310 >> 505).  K 4.5, BUN 32, SCr 1.34.  BNP has been followed Along with renal function by home health blood draws.  She feels fatigued and has some mild dyspnea on exertion. She denies dizziness or near syncope. She denies chest discomfort. Breathing is improved. She denies orthopnea, PND. LE edema has resolved.  She uses a cane when she walks outside.   Prior CV studies that were reviewed today include:    Echo 06/04/15 Mild LVH, EF 55%, no RWMA, mod AS (mean 10 mmHg, peak 21 mmHg), MAC, mild MR, severe LAE, mod reduced RVSF, mild RAE, mod TR, PASP 65 mmHg  Past Medical History:  Diagnosis Date  . Chronic a-fib (HCC)   . Chronic back pain   . Chronic diastolic (congestive) heart failure (HCC)    a. Echo 3/17: mild LVH, EF 55%, no RWMA, mod AS (mean 10 mmHg, peak 21 mmHg), MAC, mild MR, severe LAE, mod reduced RVSF, mild RAE, mod TR, PASP 65 mmHg    . CKD (chronic kidney disease) stage 3, GFR 30-59 ml/min 09/11/2015  . Hyperlipidemia   . Hypertension   . Hypothyroidism   . PNA (pneumonia)   . Pulmonary hypertension (HCC)    PASP 65 mmHg at Echo 3/17  . Thyroid disease     Past Surgical History:  Procedure Laterality Date  . APPENDECTOMY    . cataracts     bilateral  . EYE SURGERY     left eye "hole"  . TONSILLECTOMY      Current Medications:   Current Meds  Medication Sig  . atorvastatin (LIPITOR) 10 MG tablet Take 10 mg by mouth daily.  . B Complex-C (B-COMPLEX WITH VITAMIN C) tablet Take 1 tablet by mouth daily.  . brimonidine (ALPHAGAN) 0.15 % ophthalmic solution Place 1 drop into both eyes 3 (three) times daily.  . Cholecalciferol (VITAMIN D PO) Take 1 tablet by mouth daily.   Marland Kitchen diltiazem (CARDIZEM CD) 360 MG 24 hr capsule Take 1 capsule (360 mg total) by mouth daily.  . furosemide (LASIX) 80 MG tablet Take 1 tablet (80 mg total) by mouth 2 (two) times daily.  . isosorbide dinitrate (ISORDIL) 10 MG tablet Take 1 tablet (10 mg total) by mouth 2 (two) times daily.  Marland Kitchen labetalol (NORMODYNE) 200 MG tablet TAKE 1 TABLET(200 MG) BY MOUTH TWICE DAILY  . latanoprost (XALATAN) 0.005 % ophthalmic solution  Place 1 drop into both eyes at bedtime.  Marland Kitchen. levothyroxine (SYNTHROID, LEVOTHROID) 100 MCG tablet Take 100 mcg by mouth daily.  . metolazone (ZAROXOLYN) 2.5 MG tablet Take 1 tablet (2.5 mg total) by mouth as needed (take if weight goes up 3 lbs in 1 day or 5 lbs in 1 week).  Bertram Gala. Polyethyl Glycol-Propyl Glycol (SYSTANE OP) Apply 2 drops to eye daily as needed (dry eyes).   . potassium chloride SA (K-DUR,KLOR-CON) 20 MEQ tablet Take 20 mEq by mouth daily.  . timolol (TIMOPTIC) 0.5 % ophthalmic solution Place 1 drop into both eyes every morning.   . warfarin (COUMADIN) 1 MG tablet Takes 2 mg by mouth everyday except 2.5 mg on Mondays and Friday     Allergies:   Penicillins and Sulfa antibiotics   Social History   Social History   . Marital status: Widowed    Spouse name: N/A  . Number of children: N/A  . Years of education: N/A   Social History Main Topics  . Smoking status: Never Smoker  . Smokeless tobacco: Never Used  . Alcohol use No  . Drug use: No  . Sexual activity: Not Asked   Other Topics Concern  . None   Social History Narrative  . None     Family History:  The patient's family history includes Cancer in her mother; Heart attack in her father; Heart disease in her sister and sister; Other in her father.   ROS:   Please see the history of present illness.    ROS All other systems reviewed and are negative.   EKGs/Labs/Other Test Reviewed:    EKG:  EKG is  ordered today.  The ekg ordered today demonstrates AFib, HR 99, IVCD, LAFB  Recent Labs: 06/10/2015: TSH 1.325 08/11/2015: ALT 16 08/13/2015: Magnesium 1.8 08/23/2015: Hemoglobin 11.0; Platelets 232 10/27/2015: Brain Natriuretic Peptide 295.0; BUN 45; Creatinine, Ser 1.79; Potassium 3.7; Sodium 138   Recent Lipid Panel No results found for: CHOL, TRIG, HDL, CHOLHDL, VLDL, LDLCALC, LDLDIRECT   Physical Exam:    VS:  BP (!) 140/42   Pulse 72   Ht 5\' 3"  (1.6 m)   Wt 136 lb 12.8 oz (62.1 kg)   BMI 24.23 kg/m     Wt Readings from Last 3 Encounters:  11/28/15 136 lb 12.8 oz (62.1 kg)  10/27/15 138 lb (62.6 kg)  10/03/15 150 lb 3.2 oz (68.1 kg)     Physical Exam  Constitutional: She is oriented to person, place, and time. She appears well-developed and well-nourished.  HENT:  Head: Normocephalic and atraumatic.  Eyes: No scleral icterus.  Neck: Normal range of motion. No JVD present.  Cardiovascular: Normal rate, S1 normal and S2 normal.  An irregularly irregular rhythm present. Exam reveals no gallop and no friction rub.   No murmur heard. Pulmonary/Chest: Breath sounds normal. She has no wheezes. She has no rhonchi. She has no rales.  Abdominal: Soft. There is no tenderness.  Musculoskeletal: She exhibits edema.  Trace  bilateral ankle edema  Neurological: She is alert and oriented to person, place, and time.  Skin: Skin is warm and dry.  Psychiatric: She has a normal mood and affect.    ASSESSMENT:    1. HLD (hyperlipidemia)   2. Dyspnea    PLAN:    In order of problems listed above:  1. Chronic Diastolic HF -  She has had significant diuresis with metolazone in addition to Lasix. She mistakenly took the metolazone on a daily  basis. She does feel weak but denies dizziness. Blood pressure is stable. Her heart rate is somewhat elevated today.  Weight is down 12 pounds.  -  Check BMET today. Obtain BNP  -  Metolazone prn, but weight has been stable.  -  Weigh daily.   -  Take metolazone when necessary 3 pound weight gain in 24 hours or 5 pounds in one week   -  If creatinine stable, continue Lasix 80 bid  -  If creatinine increased >> change Lasix to 80 in A and 40 in P  Leg swelling much improved.    2. Chronic atrial fibrillation - Heart rate with fair control.  Continue current dose of diltiazem, labetalol. Continue Coumadin.  No bleeding issues with anticoagulation.   3. Pulmonary hypertension - Severe by echocardiogram 3/17.  She could not tolerate Revatio.  Continue isosorbide dinitrate 10 mg twice a day.  Likley age related and related to chronic diastolic heart failure.   4. HTN - Controlled.    5. CKD - Recent Creatinines stable- 1.4 on 11/20/15.  With significant diuresis, I will check BMET today.  If creatinine increased, will adjust Lasix as outlined above.   Medication Adjustments/Labs and Tests Ordered: Current medicines are reviewed at length with the patient today.  Concerns regarding medicines are outlined above.  Medication changes, Labs and Tests ordered today are outlined in the Patient Instructions noted below. Patient Instructions  Medication Instructions:  Same-no changes  Labwork: Today-Lipids/BMET/TSH/BNP  Testing/Procedures: None  Follow-Up: Your physician  wants you to follow-up in: 6 months. You will receive a reminder letter in the mail two months in advance. If you don't receive a letter, please call our office to schedule the follow-up appointment.     If you need a refill on your cardiac medications before your next appointment, please call your pharmacy.    Signed, Lance Muss, MD  11/28/2015 4:29 PM    [facility] [address] [place], [place], [place]  [address] Phone: [phone]; Fax: [phone]

## 2015-11-29 ENCOUNTER — Telehealth: Payer: Self-pay | Admitting: Interventional Cardiology

## 2015-11-29 ENCOUNTER — Other Ambulatory Visit: Payer: Self-pay

## 2015-11-29 LAB — LIPID PANEL
CHOL/HDL RATIO: 2.7 ratio (ref <=5.0)
CHOLESTEROL: 155 mg/dL (ref 125–200)
HDL: 58 mg/dL (ref >=46)
LDL Cholesterol: 73 mg/dL (ref <130)
Triglycerides: 122 mg/dL (ref <150)
VLDL: 24 mg/dL (ref <30)

## 2015-11-29 LAB — BASIC METABOLIC PANEL
BUN: 37 mg/dL — ABNORMAL HIGH (ref 7–25)
CHLORIDE: 98 mmol/L (ref 98–110)
CO2: 27 mmol/L (ref 20–31)
CREATININE: 1.74 mg/dL — AB (ref 0.60–0.88)
Calcium: 9.5 mg/dL (ref 8.6–10.4)
Glucose, Bld: 129 mg/dL — ABNORMAL HIGH (ref 65–99)
Potassium: 3.6 mmol/L (ref 3.5–5.3)
Sodium: 141 mmol/L (ref 135–146)

## 2015-11-29 LAB — BRAIN NATRIURETIC PEPTIDE

## 2015-11-29 MED ORDER — FUROSEMIDE 80 MG PO TABS: ORAL_TABLET | ORAL | 3 refills | Status: DC

## 2015-11-29 NOTE — Telephone Encounter (Signed)
**Note De-identified Lee Kalt Obfuscation** The pt has been given her lab results and she verbalized understanding. 

## 2015-11-29 NOTE — Telephone Encounter (Signed)
Follow Up:; ° ° °Returning your call. °

## 2015-11-30 ENCOUNTER — Ambulatory Visit (INDEPENDENT_AMBULATORY_CARE_PROVIDER_SITE_OTHER): Payer: Medicare Other | Admitting: Cardiovascular Disease

## 2015-11-30 DIAGNOSIS — Z5181 Encounter for therapeutic drug level monitoring: Secondary | ICD-10-CM

## 2015-11-30 LAB — POCT INR: INR: 4.1

## 2015-12-07 LAB — POCT INR: INR: 2.9

## 2015-12-11 ENCOUNTER — Telehealth: Payer: Self-pay | Admitting: Interventional Cardiology

## 2015-12-11 ENCOUNTER — Ambulatory Visit (INDEPENDENT_AMBULATORY_CARE_PROVIDER_SITE_OTHER): Payer: Medicare Other | Admitting: Pharmacist

## 2015-12-11 ENCOUNTER — Telehealth: Payer: Self-pay | Admitting: Pharmacist

## 2015-12-11 DIAGNOSIS — Z5181 Encounter for therapeutic drug level monitoring: Secondary | ICD-10-CM

## 2015-12-11 NOTE — Telephone Encounter (Signed)
**Note De-identified Tonya Farley Obfuscation** LMTCB

## 2015-12-11 NOTE — Telephone Encounter (Signed)
New Message:    She needs a phone order for patient. She would like to continue seeing patient for once a week for 9 weeks please. If she does not answer,please leave on her voicemail..Marland Kitchen

## 2015-12-12 NOTE — Telephone Encounter (Signed)
[name] ( [facility] ) is calling to get orders approved for Frequency once a week for 9 weeks , please leave a voicemail to let her know that its okay . Please call    Thanks

## 2015-12-12 NOTE — Telephone Encounter (Signed)
Will forward to Dr. Eldridge DaceVaranasi to see if he is ok with continuing to sign [facility] orders for cardiac issues for another 9 weeks?

## 2015-12-12 NOTE — Addendum Note (Signed)
Addended by: SUPPLE, MEGAN E on: 12/12/2015 01:51 PM   Modules accepted: SmartSet

## 2015-12-12 NOTE — Telephone Encounter (Signed)
This encounter was created in error - please disregard.

## 2015-12-12 NOTE — Telephone Encounter (Signed)
OK 

## 2015-12-13 ENCOUNTER — Telehealth: Payer: Self-pay | Admitting: Interventional Cardiology

## 2015-12-13 NOTE — Telephone Encounter (Signed)
Tonya Farley calling about approval to continue cardiac tx that Dr. Eldridge DaceVaranasi had signed for previously.  Advised Tonya Farley that I spoke with Tonya Farley from there office earlier and advised her that Dr. Eldridge DaceVaranasi said ok to continue to 9 more weeks.  Tonya Farley verbalized understanding and was appreciative for assistance.

## 2015-12-13 NOTE — Telephone Encounter (Signed)
Spoke with [name] and advised her Dr. Eldridge DaceVaranasi said ok to continuing for 9 more works.  [name] verbalized understanding.

## 2015-12-13 NOTE — Telephone Encounter (Signed)
New message    Nurse calling to get approval for procedure. Please call.

## 2015-12-14 ENCOUNTER — Ambulatory Visit (INDEPENDENT_AMBULATORY_CARE_PROVIDER_SITE_OTHER): Payer: Medicare Other | Admitting: Cardiovascular Disease

## 2015-12-14 DIAGNOSIS — Z5181 Encounter for therapeutic drug level monitoring: Secondary | ICD-10-CM

## 2015-12-14 LAB — POCT INR: INR: 2.5

## 2015-12-28 ENCOUNTER — Ambulatory Visit (INDEPENDENT_AMBULATORY_CARE_PROVIDER_SITE_OTHER): Payer: Medicare Other | Admitting: Internal Medicine

## 2015-12-28 DIAGNOSIS — Z5181 Encounter for therapeutic drug level monitoring: Secondary | ICD-10-CM

## 2015-12-28 LAB — POCT INR: INR: 1.9

## 2016-01-04 ENCOUNTER — Telehealth: Payer: Self-pay | Admitting: Interventional Cardiology

## 2016-01-04 NOTE — Telephone Encounter (Signed)
Amy is advised to re-fax form that was not signed by Dr Eldridge DaceVaranasi back to us and that I will review and if appropriate I will give to Dr Eldridge DaceVaranasi for his review and signature. Amy verbalized undestanding and thanked me for my assistance.

## 2016-01-04 NOTE — Telephone Encounter (Signed)
New Message  Amy from [facility] call requesting to speak with RN about fax record sent on pt to be signed. Amy states all the forms that were sent are cardiac forms. Amy states that three of the forms were sent back not signed by provider. Please call back to discuss

## 2016-01-11 ENCOUNTER — Ambulatory Visit (INDEPENDENT_AMBULATORY_CARE_PROVIDER_SITE_OTHER): Payer: Medicare Other

## 2016-01-11 DIAGNOSIS — Z5181 Encounter for therapeutic drug level monitoring: Secondary | ICD-10-CM

## 2016-01-11 LAB — POCT INR: INR: 1.9

## 2016-01-25 ENCOUNTER — Ambulatory Visit (INDEPENDENT_AMBULATORY_CARE_PROVIDER_SITE_OTHER): Payer: Medicare Other | Admitting: Cardiology

## 2016-01-25 DIAGNOSIS — Z5181 Encounter for therapeutic drug level monitoring: Secondary | ICD-10-CM

## 2016-01-25 LAB — POCT INR: INR: 1.6

## 2016-01-26 ENCOUNTER — Encounter: Payer: Self-pay | Admitting: Interventional Cardiology

## 2016-01-31 ENCOUNTER — Telehealth: Payer: Self-pay | Admitting: Interventional Cardiology

## 2016-01-31 NOTE — Telephone Encounter (Signed)
I spoke with Lattie Haw, [facility]. Lattie Haw states they have met education goals for heart failure and hypertension. Lattie Haw states unless they have additional orders/information they will have to discharge from [facility] services, they cannot continue to go just to draw blood.   Lattie Haw advised I will forward to Dr Irish Lack.

## 2016-01-31 NOTE — Telephone Encounter (Signed)
Seems like HH agency cannot come up with a valid reason to extend services. Please let POA know.

## 2016-01-31 NOTE — Telephone Encounter (Signed)
Pt getting home care right now, and they have stretched out her service as far as they could, they can't continue for labs only, wanting to know if any orders-education can be added so they can continue seeing her? Even going out for labs is very taxing on her-pls call [phone]

## 2016-02-01 NOTE — Telephone Encounter (Addendum)
Spoke with Wille CelesteJanie and informed her info provided by Dr. Eldridge DaceVaranasi.  Wille CelesteJanie states that pt would like to come to our office to have INR checked.  Rodman Pickledvised Janie I will send message to CVRR clinic and have them follow up with pt to get her re-established with them to follow INR in office.  Janie appreciative for assistance.

## 2016-02-01 NOTE — Telephone Encounter (Signed)
Left message for niece, Army ChacoJanie Darr, to call back.  DPR on file.

## 2016-02-01 NOTE — Telephone Encounter (Signed)
Returned a call to [place] North[place]ie, who statesthe pt can make the appt because she has a calendar of events that she keeps up with.  Called pt & she stated that [facility] would have a visit next week and that would be the last visit.  Advised that we would set the appt [facility] as scheduled.   Called [name] & left her a msg regarding this information. Also, called the Nurse Manager Chrissy & she stated the pt has a scheduled visit on 02/08/16 and that would be the last visit unless she was recertified for extensive care. Instructed her that [facility], we would make the pt an appt in the office over the phone to ensure continuity of care with INR checks. She stated she would ensure the last check was done & called to us on 02/08/16 as previously ordered.

## 2016-02-01 NOTE — Telephone Encounter (Addendum)
I left a message on HHN, Lisa's, VM stating that Dr Eldridge DaceVaranasi will sign for cardiology issues and INR checks only and that if [facility] the pts [facility]. I left this office's phone number to call back if she has any questions.

## 2016-02-08 ENCOUNTER — Ambulatory Visit (INDEPENDENT_AMBULATORY_CARE_PROVIDER_SITE_OTHER): Payer: Medicare Other

## 2016-02-08 DIAGNOSIS — Z5181 Encounter for therapeutic drug level monitoring: Secondary | ICD-10-CM

## 2016-02-08 LAB — POCT INR: INR: 1.8

## 2016-02-22 ENCOUNTER — Ambulatory Visit (INDEPENDENT_AMBULATORY_CARE_PROVIDER_SITE_OTHER): Payer: Medicare Other | Admitting: *Deleted

## 2016-02-22 DIAGNOSIS — Z5181 Encounter for therapeutic drug level monitoring: Secondary | ICD-10-CM | POA: Diagnosis not present

## 2016-02-22 DIAGNOSIS — I701 Atherosclerosis of renal artery: Secondary | ICD-10-CM

## 2016-02-22 DIAGNOSIS — I4891 Unspecified atrial fibrillation: Secondary | ICD-10-CM

## 2016-02-22 LAB — POCT INR: INR: 1.7

## 2016-03-07 ENCOUNTER — Ambulatory Visit (INDEPENDENT_AMBULATORY_CARE_PROVIDER_SITE_OTHER): Payer: Medicare Other

## 2016-03-07 DIAGNOSIS — Z5181 Encounter for therapeutic drug level monitoring: Secondary | ICD-10-CM | POA: Diagnosis not present

## 2016-03-07 DIAGNOSIS — I4891 Unspecified atrial fibrillation: Secondary | ICD-10-CM

## 2016-03-07 DIAGNOSIS — I701 Atherosclerosis of renal artery: Secondary | ICD-10-CM

## 2016-03-07 LAB — POCT INR: INR: 1.5

## 2016-03-19 ENCOUNTER — Ambulatory Visit (INDEPENDENT_AMBULATORY_CARE_PROVIDER_SITE_OTHER): Payer: Medicare Other

## 2016-03-19 ENCOUNTER — Ambulatory Visit (HOSPITAL_COMMUNITY)
Admission: EM | Admit: 2016-03-19 | Discharge: 2016-03-19 | Disposition: A | Discharge status: Home / Self Care | Payer: Medicare Other | Attending: Family Medicine | Admitting: Family Medicine

## 2016-03-19 ENCOUNTER — Encounter (HOSPITAL_COMMUNITY): Payer: Self-pay | Admitting: Emergency Medicine

## 2016-03-19 DIAGNOSIS — R059 Cough, unspecified: Secondary | ICD-10-CM

## 2016-03-19 DIAGNOSIS — J189 Pneumonia, unspecified organism: Secondary | ICD-10-CM | POA: Diagnosis not present

## 2016-03-19 DIAGNOSIS — R05 Cough: Secondary | ICD-10-CM

## 2016-03-19 MED ORDER — ALBUTEROL SULFATE (5 MG/ML) 0.5% IN NEBU: 2.5000 mg | INHALATION_SOLUTION | Freq: Four times a day (QID) | RESPIRATORY_TRACT | 12 refills | Status: DC | PRN

## 2016-03-19 MED ORDER — PREDNISONE 10 MG (21) PO TBPK: 10.0000 mg | ORAL_TABLET | Freq: Every day | ORAL | 0 refills | Status: DC

## 2016-03-19 MED ORDER — DOXYCYCLINE HYCLATE 50 MG PO CAPS: 50.0000 mg | ORAL_CAPSULE | Freq: Two times a day (BID) | ORAL | 0 refills | Status: DC

## 2016-03-19 NOTE — ED Triage Notes (Signed)
[facility] family with a complaint of a cough with congestion and nasal drainage x 6 days. The patient reported using OTC meds with minimal relief.

## 2016-03-19 NOTE — ED Provider Notes (Signed)
CSN: [id number]     Arrival date & time 03/19/16  4098 History   First MD Initiated Contact with Patient 03/19/16 1144     Chief Complaint  Patient presents with  . Cough   (Consider location/radiation/quality/duration/timing/severity/associated sxs/prior Treatment) Pt brought in by daughter. She lives at home and in 5 days has noticed cough,  Congestion, and wheezing. Has has pneumonia last year and was in hospital 1 month. Daughter wanted to ensure that she was not getting sick again. Pt has only taken OTC medications. Has a neb machine and inhalor at home but has no medication for the neb machine.       Past Medical History:  Diagnosis Date  . Chronic a-fib (HCC)   . Chronic back pain   . Chronic diastolic (congestive) heart failure    a. Echo 3/17: mild LVH, EF 55%, no RWMA, mod AS (mean 10 mmHg, peak 21 mmHg), MAC, mild MR, severe LAE, mod reduced RVSF, mild RAE, mod TR, PASP 65 mmHg  . CKD (chronic kidney disease) stage 3, GFR 30-59 ml/min 09/11/2015  . Hyperlipidemia   . Hypertension   . Hypothyroidism   . PNA (pneumonia)   . Pulmonary hypertension    PASP 65 mmHg at Echo 3/17  . Thyroid disease    Past Surgical History:  Procedure Laterality Date  . APPENDECTOMY    . cataracts     bilateral  . EYE SURGERY     left eye "hole"  . TONSILLECTOMY     Family History  Problem Relation Age of Onset  . Heart disease Sister   . Heart disease Sister   . Cancer Mother   . Other Father   . Heart attack Father    Social History  Substance Use Topics  . Smoking status: Never Smoker  . Smokeless tobacco: Never Used  . Alcohol use No   OB History    No data available     Review of Systems  Constitutional: Negative.   HENT: Positive for congestion, postnasal drip and rhinorrhea.   Eyes: Negative.   Respiratory: Positive for cough and wheezing.   Cardiovascular: Negative.   Skin: Negative.   Neurological: Negative.     Allergies  Penicillins and Sulfa  antibiotics  Home Medications   Prior to Admission medications   Medication Sig Start Date End Date Taking? Authorizing Provider  albuterol (PROVENTIL) (5 MG/ML) 0.5% nebulizer solution Take 0.5 mLs (2.5 mg total) by nebulization every 6 (six) hours as needed for wheezing or shortness of breath. 03/19/16   Tobi Bastos, NP  atorvastatin (LIPITOR) 10 MG tablet Take 10 mg by mouth daily.    Historical Provider, MD  B Complex-C (B-COMPLEX WITH VITAMIN C) tablet Take 1 tablet by mouth daily.    Historical Provider, MD  brimonidine (ALPHAGAN) 0.15 % ophthalmic solution Place 1 drop into both eyes 3 (three) times daily.    Historical Provider, MD  Cholecalciferol (VITAMIN D PO) Take 1 tablet by mouth daily.     Historical Provider, MD  diltiazem (CARDIZEM CD) 360 MG 24 hr capsule Take 1 capsule (360 mg total) by mouth daily. 09/12/15   Corky Crafts, MD  doxycycline (VIBRAMYCIN) 50 MG capsule Take 1 capsule (50 mg total) by mouth 2 (two) times daily. 03/19/16   Tobi Bastos, NP  furosemide (LASIX) 80 MG tablet Take 80 mg in the morning and take 40 mg in afternoon. May increase to 80 mg BID for edema. 11/29/15   Renelda Loma  Hoyle BarrS Varanasi, MD  isosorbide dinitrate (ISORDIL) 10 MG tablet Take 1 tablet (10 mg total) by mouth 2 (two) times daily. 09/11/15   Beatrice LecherScott T Weaver, PA-C  labetalol (NORMODYNE) 200 MG tablet TAKE 1 TABLET(200 MG) BY MOUTH TWICE DAILY 11/24/15   Corky CraftsJayadeep S Varanasi, MD  latanoprost (XALATAN) 0.005 % ophthalmic solution Place 1 drop into both eyes at bedtime. 01/10/14   Historical Provider, MD  levothyroxine (SYNTHROID, LEVOTHROID) 100 MCG tablet Take 100 mcg by mouth daily.    Historical Provider, MD  metolazone (ZAROXOLYN) 2.5 MG tablet Take 1 tablet (2.5 mg total) by mouth as needed (take if weight goes up 3 lbs in 1 day or 5 lbs in 1 week). 10/27/15   Beatrice LecherScott T Weaver, PA-C  Polyethyl Glycol-Propyl Glycol (SYSTANE OP) Apply 2 drops to eye daily as needed (dry eyes).     Historical  Provider, MD  potassium chloride SA (K-DUR,KLOR-CON) 20 MEQ tablet Take 20 mEq by mouth daily.    Historical Provider, MD  predniSONE (STERAPRED UNI-PAK 21 TAB) 10 MG (21) TBPK tablet Take 1 tablet (10 mg total) by mouth daily. Take 6 tabs by mouth daily  for 2 days, then 5 tabs for 2 days, then 4 tabs for 2 days, then 3 tabs for 2 days, 2 tabs for 2 days, then 1 tab by mouth daily for 2 days 03/19/16   Tobi BastosMelanie A Nivin Braniff, NP  timolol (TIMOPTIC) 0.5 % ophthalmic solution Place 1 drop into both eyes every morning.  12/28/13   Historical Provider, MD  warfarin (COUMADIN) 1 MG tablet Takes 2 mg by mouth everyday except 2.5 mg on Mondays and Friday    Historical Provider, MD   Meds Ordered and Administered this Visit  Medications - No data to display  BP 113/66 (BP Location: Left Arm)   Pulse 84   Temp 99.2 F (37.3 C) (Oral)   Resp 14   SpO2 95%  No data found.   Physical Exam  Constitutional: She appears well-developed and well-nourished.  HENT:  Mouth/Throat: Oropharynx is clear and moist.  Eyes: Pupils are equal, round, and reactive to light.  Neck: Normal range of motion.  Cardiovascular: Normal heart sounds.   Pulmonary/Chest: She has wheezes.  Non productive cough, denies any sob,   Abdominal: Soft.  Neurological: She is alert.  Skin: Skin is warm.    Urgent Care Course   Clinical Course     Procedures (including critical care time)  Labs Review Labs Reviewed - No data to display  Imaging Review Dg Chest 2 View  Result Date: 03/19/2016 CLINICAL DATA:  Nonproductive cough and congestion for 1 week EXAM: CHEST  2 VIEW COMPARISON:  08/23/2015 FINDINGS: Cardiomegaly evident without superimposed edema or CHF. Streaky bibasilar densities compatible with atelectasis or scarring. No enlarging effusion or significant pneumothorax. Trachea is midline. Atherosclerosis noted of the aorta. Bones are osteopenic. Chronic compression deformities at the thoracic lumbar junction with  previous vertebral augmentation. IMPRESSION: Stable cardiomegaly without CHF Bibasilar atelectasis versus scarring Thoracic aortic atherosclerosis No superimposed acute process. Electronically Signed   By: Judie PetitM.  Shick M.D.   On: 03/19/2016 11:56             MDM   1. Community acquired pneumonia, unspecified laterality   2. Cough    Will treat with abx Take full dose of abx this may increase bleeding factors monitor for bruising increase  Pt has neb machine at home needs refill on albuterol  Will give steroids to help  Tobi BastosMelanie A Zaya Kessenich, NP 03/19/16 1220

## 2016-03-21 ENCOUNTER — Ambulatory Visit (INDEPENDENT_AMBULATORY_CARE_PROVIDER_SITE_OTHER): Payer: Medicare Other | Admitting: *Deleted

## 2016-03-21 DIAGNOSIS — I4891 Unspecified atrial fibrillation: Secondary | ICD-10-CM

## 2016-03-21 DIAGNOSIS — I701 Atherosclerosis of renal artery: Secondary | ICD-10-CM

## 2016-03-21 DIAGNOSIS — Z5181 Encounter for therapeutic drug level monitoring: Secondary | ICD-10-CM | POA: Diagnosis not present

## 2016-03-21 LAB — POCT INR: INR: 2.7

## 2016-03-26 ENCOUNTER — Telehealth: Payer: Self-pay | Admitting: Interventional Cardiology

## 2016-03-26 NOTE — Telephone Encounter (Signed)
Ms Loop calls to say keep CVRR appt on tomorrow at 1015am. Will inform us tomorrow if pt sees practitioner today and gets new mediation.

## 2016-03-26 NOTE — Telephone Encounter (Signed)
Spoke with Mrs. Loop, DPR on file.  She advised that pt is still having some wheezing and she didn't know if our office was able to treat pt or not.  Advised that this would need to come from PCP as recommended by Urgent Care.  Mrs. Loop had contacted PCP office and they did not have availability until next week.  Advised she can also call Urgent Care back.  Mrs. Loop will plan to do this.  While on the phone she mentioned pt has CV[facility] appt tomorrow that she is hoping to move to today as she has court tomorrow.  Advised I will send this message to CV[facility] clinic and have them call her to change appt.  Mrs. Loop very appreciative for assistance.

## 2016-03-26 NOTE — Telephone Encounter (Signed)
[name] is calling because Tonya Farley , because she has pneumonia again and  questionable  pulmonary edema. Please call

## 2016-03-27 ENCOUNTER — Ambulatory Visit (INDEPENDENT_AMBULATORY_CARE_PROVIDER_SITE_OTHER): Payer: Medicare Other | Admitting: *Deleted

## 2016-03-27 DIAGNOSIS — Z5181 Encounter for therapeutic drug level monitoring: Secondary | ICD-10-CM

## 2016-03-27 DIAGNOSIS — I4891 Unspecified atrial fibrillation: Secondary | ICD-10-CM | POA: Diagnosis not present

## 2016-03-27 LAB — POCT INR: INR: 3.1

## 2016-04-03 ENCOUNTER — Other Ambulatory Visit: Payer: Self-pay | Admitting: *Deleted

## 2016-04-03 MED ORDER — DILTIAZEM HCL ER COATED BEADS 360 MG PO CP24: 360.0000 mg | ORAL_CAPSULE | Freq: Every day | ORAL | 2 refills | Status: DC

## 2016-04-09 ENCOUNTER — Ambulatory Visit (INDEPENDENT_AMBULATORY_CARE_PROVIDER_SITE_OTHER): Payer: Medicare Other | Admitting: *Deleted

## 2016-04-09 DIAGNOSIS — Z5181 Encounter for therapeutic drug level monitoring: Secondary | ICD-10-CM

## 2016-04-09 DIAGNOSIS — I4891 Unspecified atrial fibrillation: Secondary | ICD-10-CM | POA: Diagnosis not present

## 2016-04-09 LAB — POCT INR: INR: 3.3

## 2016-04-16 ENCOUNTER — Other Ambulatory Visit: Payer: Self-pay | Admitting: Pharmacist

## 2016-04-16 MED ORDER — WARFARIN SODIUM 1 MG PO TABS: ORAL_TABLET | ORAL | 1 refills | Status: DC

## 2016-04-25 ENCOUNTER — Ambulatory Visit (INDEPENDENT_AMBULATORY_CARE_PROVIDER_SITE_OTHER): Payer: Medicare Other | Admitting: *Deleted

## 2016-04-25 DIAGNOSIS — I4891 Unspecified atrial fibrillation: Secondary | ICD-10-CM | POA: Diagnosis not present

## 2016-04-25 DIAGNOSIS — Z5181 Encounter for therapeutic drug level monitoring: Secondary | ICD-10-CM | POA: Diagnosis not present

## 2016-04-25 LAB — POCT INR: INR: 2.1

## 2016-05-02 NOTE — Progress Notes (Signed)
Cardiology Office Note   Date:  05/03/2016   ID:  Tonya Farley, DOB May 30, 1919, MRN [id number]  PCP:  Myriam Jacobson, MD    No chief complaint on file.  F/u AFib  Wt Readings from Last 3 Encounters:  05/03/16 142 lb 12.8 oz (64.8 kg)  11/28/15 136 lb 12.8 oz (62.1 kg)  10/27/15 138 lb (62.6 kg)       History of Present Illness: Tonya Farley is a 83 y.o. female  with a hx of RA stenosis, HTN, chronic AF, Coumadin anticoagulation, hypothyroidism. Admitted in 3/17 with pneumonia and bilateral pleural effusions s/p R thoracentesis. Echo in 3/17 with normal EF, mild to mod AS, some RV failure and PASP 65 mmHg. She was admitted again in 5/17 with AF with RVR in the setting of recurrent left effusion. She underwent left-sided thoracentesis. It was not clear that this was cardiac related. Lasix was adjusted. Given her pulmonary hypertension, vasodilator therapy was recommended by Dr. Johnsie Cancel. Revatio 20 mg 3 times a day was recommended. Labetalol dose was adjusted for rate control. She had to stop Revatio due to side effects and cost.   Recent labs 10/13/15 with improved BNP (7310 >> 505).  K 4.5, BUN 32, SCr 1.34.  BNP has been followed Along with renal function by home health blood draws.  She feels fatigued and has some mild dyspnea on exertion. She denies dizziness or near syncope. She denies chest discomfort. Breathing is improved. She denies orthopnea, PND. LE edema has resolved.  She uses a cane when she walks outside.  She has had issues with the cost of her nitrate.  No bleeding problems.  No recent falls.     Past Medical History:  Diagnosis Date  . Chronic a-fib ([place])   . Chronic back pain   . Chronic diastolic (congestive) heart failure    a. Echo 3/17: mild LVH, EF 55%, no RWMA, mod AS (mean 10 mmHg, peak 21 mmHg), MAC, mild MR, severe LAE, mod reduced RVSF, mild RAE, mod TR, PASP 65 mmHg  . CKD (chronic kidney disease) stage 3, GFR 30-59 ml/min  09/11/2015  . Hyperlipidemia   . Hypertension   . Hypothyroidism   . PNA (pneumonia)   . Pulmonary hypertension    PASP 65 mmHg at Echo 3/17  . Thyroid disease     Past Surgical History:  Procedure Laterality Date  . APPENDECTOMY    . cataracts     bilateral  . EYE SURGERY     left eye "hole"  . TONSILLECTOMY       Current Outpatient Prescriptions  Medication Sig Dispense Refill  . albuterol (PROVENTIL) (5 MG/ML) 0.5% nebulizer solution Take 0.5 mLs (2.5 mg total) by nebulization every 6 (six) hours as needed for wheezing or shortness of breath. 20 mL 12  . atorvastatin (LIPITOR) 10 MG tablet Take 10 mg by mouth daily.    . B Complex-C (B-COMPLEX WITH VITAMIN C) tablet Take 1 tablet by mouth daily.    . brimonidine (ALPHAGAN) 0.15 % ophthalmic solution Place 1 drop into both eyes 3 (three) times daily.    . Cholecalciferol (VITAMIN D PO) Take 1 tablet by mouth daily.     Marland Kitchen diltiazem (CARDIZEM CD) 360 MG 24 hr capsule Take 1 capsule (360 mg total) by mouth daily. 90 capsule 2  . doxycycline (VIBRAMYCIN) 50 MG capsule Take 1 capsule (50 mg total) by mouth 2 (two) times daily. 14 capsule 0  . furosemide (  LASIX) 80 MG tablet Take 80 mg in the morning and take 40 mg in afternoon. May increase to 80 mg BID for edema. 90 tablet 3  . isosorbide dinitrate (ISORDIL) 10 MG tablet Take 1 tablet (10 mg total) by mouth 2 (two) times daily. 60 tablet 11  . labetalol (NORMODYNE) 200 MG tablet TAKE 1 TABLET(200 MG) BY MOUTH TWICE DAILY 60 tablet 11  . latanoprost (XALATAN) 0.005 % ophthalmic solution Place 1 drop into both eyes at bedtime.  3  . levothyroxine (SYNTHROID, LEVOTHROID) 100 MCG tablet Take 100 mcg by mouth daily.    . metolazone (ZAROXOLYN) 2.5 MG tablet Take 1 tablet (2.5 mg total) by mouth as needed (take if weight goes up 3 lbs in 1 day or 5 lbs in 1 week). 20 tablet 1  . Polyethyl Glycol-Propyl Glycol (SYSTANE OP) Apply 2 drops to eye daily as needed (dry eyes).     . potassium  chloride SA (K-DUR,KLOR-CON) 20 MEQ tablet Take 20 mEq by mouth daily.    . predniSONE (STERAPRED UNI-PAK 21 TAB) 10 MG (21) TBPK tablet Take 1 tablet (10 mg total) by mouth daily. Take 6 tabs by mouth daily  for 2 days, then 5 tabs for 2 days, then 4 tabs for 2 days, then 3 tabs for 2 days, 2 tabs for 2 days, then 1 tab by mouth daily for 2 days 42 tablet 0  . timolol (TIMOPTIC) 0.5 % ophthalmic solution Place 1 drop into both eyes every morning.   3  . warfarin (COUMADIN) 1 MG tablet Take 2.5 or 3 tablets as directed by Coumadin clinic 74 tablet 1   No current facility-administered medications for this visit.     Allergies:   Penicillins and Sulfa antibiotics    Social History:  The patient  reports that she has never smoked. She has never used smokeless tobacco. She reports that she does not drink alcohol or use drugs.   Family History:  The patient's family history includes Cancer in her mother; Heart attack in her father; Heart disease in her sister and sister; Other in her father.    ROS:  Please see the history of present illness.   Otherwise, review of systems are positive for joint pains.   All other systems are reviewed and negative.    PHYSICAL EXAM: VS:  BP (!) 144/60 (BP Location: Right Arm, Patient Position: Sitting, Cuff Size: Normal)   Pulse 78   Ht 5' 3"  (1.6 m)   Wt 142 lb 12.8 oz (64.8 kg)   BMI 25.30 kg/m  , BMI Body mass index is 25.3 kg/m. GEN: Well nourished, well developed, in no acute distress  HEENT: bruis on left neck Neck: no JVD, carotid bruits, or masses Cardiac: irregularly irregular; no murmurs, rubs, or gallops; tr edema  Respiratory:  clear to auscultation bilaterally, normal work of breathing GI: soft, nontender, nondistended, + BS MS: no deformity or atrophy  Skin: warm and dry, no rash Neuro:  Strength and sensation are intact Psych: euthymic mood, full affect   EKG:   The ekg ordered in 8/17 demonstrates : AFib, rate controlled   Recent  Labs: 08/11/2015: ALT 16 08/13/2015: Magnesium 1.8 11/28/2015: Brain Natriuretic Peptide CANCELED; BUN 37; Creat 1.74; Hemoglobin 10.3; Platelets 295; Potassium 3.6; Sodium 141; TSH 0.29   Lipid Panel    Component Value Date/Time   CHOL 155 11/28/2015 1535   TRIG 122 11/28/2015 1535   HDL 58 11/28/2015 1535   CHOLHDL 2.7 11/28/2015  1535   VLDL 24 11/28/2015 1535   LDLCALC 73 11/28/2015 1535     Other studies Reviewed: Additional studies/ records that were reviewed today with results demonstrating: .   ASSESSMENT AND PLAN:  1. Chronic diastolic heart failure: Appears euvolemic.  Can titrate diuretic as needed. 2. Atrial fibrillation: Rate controlled. COntinue current meds. 3. CKD: Stable at last check.  4. Anticoagulated: No bleeding issues. Continue warfarin. 5. Hypertensive heart disease: Change nitrate to Imdur 30 mg daily due to cost of current med.   Current medicines are reviewed at length with the patient today.  The patient concerns regarding her medicines were addressed.  The following changes have been made:  Nitrate has been changed  Labs/ tests ordered today include:  No orders of the defined types were placed in this encounter.   Recommend 150 minutes/week of aerobic exercise safely; avoid falls Low fat, low carb, high fiber diet recommended  Disposition:   FU in 1 year   Signed, Larae Grooms, MD  05/03/2016 3:19 PM    [facility] [place], [place], [place]  [address] Phone: [phone]; Fax: [phone]

## 2016-05-03 ENCOUNTER — Ambulatory Visit (INDEPENDENT_AMBULATORY_CARE_PROVIDER_SITE_OTHER): Payer: Medicare Other | Admitting: Interventional Cardiology

## 2016-05-03 ENCOUNTER — Encounter: Payer: Self-pay | Admitting: Interventional Cardiology

## 2016-05-03 VITALS — BP 144/60 | HR 78 | Ht 63.0 in | Wt 142.8 lb

## 2016-05-03 DIAGNOSIS — I119 Hypertensive heart disease without heart failure: Secondary | ICD-10-CM | POA: Diagnosis not present

## 2016-05-03 DIAGNOSIS — I481 Persistent atrial fibrillation: Secondary | ICD-10-CM

## 2016-05-03 DIAGNOSIS — N183 Chronic kidney disease, stage 3 unspecified: Secondary | ICD-10-CM

## 2016-05-03 DIAGNOSIS — I5032 Chronic diastolic (congestive) heart failure: Secondary | ICD-10-CM

## 2016-05-03 DIAGNOSIS — Z7901 Long term (current) use of anticoagulants: Secondary | ICD-10-CM | POA: Diagnosis not present

## 2016-05-03 DIAGNOSIS — I4819 Other persistent atrial fibrillation: Secondary | ICD-10-CM

## 2016-05-03 MED ORDER — ISOSORBIDE MONONITRATE ER 30 MG PO TB24: 30.0000 mg | ORAL_TABLET | Freq: Every day | ORAL | 3 refills | Status: DC

## 2016-05-03 NOTE — Patient Instructions (Signed)

## 2016-05-08 DIAGNOSIS — I119 Hypertensive heart disease without heart failure: Secondary | ICD-10-CM | POA: Insufficient documentation

## 2016-05-16 ENCOUNTER — Ambulatory Visit (INDEPENDENT_AMBULATORY_CARE_PROVIDER_SITE_OTHER): Payer: Medicare Other

## 2016-05-16 DIAGNOSIS — I4891 Unspecified atrial fibrillation: Secondary | ICD-10-CM | POA: Diagnosis not present

## 2016-05-16 DIAGNOSIS — Z5181 Encounter for therapeutic drug level monitoring: Secondary | ICD-10-CM

## 2016-05-16 LAB — POCT INR: INR: 2

## 2016-06-13 ENCOUNTER — Other Ambulatory Visit: Payer: Self-pay | Admitting: Interventional Cardiology

## 2016-06-14 ENCOUNTER — Ambulatory Visit (INDEPENDENT_AMBULATORY_CARE_PROVIDER_SITE_OTHER): Payer: Medicare Other | Admitting: *Deleted

## 2016-06-14 DIAGNOSIS — I4891 Unspecified atrial fibrillation: Secondary | ICD-10-CM

## 2016-06-14 DIAGNOSIS — Z5181 Encounter for therapeutic drug level monitoring: Secondary | ICD-10-CM

## 2016-06-14 LAB — POCT INR: INR: 2.3

## 2016-07-02 ENCOUNTER — Other Ambulatory Visit: Payer: Self-pay | Admitting: *Deleted

## 2016-07-02 MED ORDER — LABETALOL HCL 200 MG PO TABS: ORAL_TABLET | ORAL | 2 refills | Status: DC

## 2016-07-18 ENCOUNTER — Ambulatory Visit (INDEPENDENT_AMBULATORY_CARE_PROVIDER_SITE_OTHER): Payer: Medicare Other | Admitting: *Deleted

## 2016-07-18 DIAGNOSIS — Z5181 Encounter for therapeutic drug level monitoring: Secondary | ICD-10-CM | POA: Diagnosis not present

## 2016-07-18 DIAGNOSIS — I4891 Unspecified atrial fibrillation: Secondary | ICD-10-CM

## 2016-07-18 LAB — POCT INR: INR: 1.9

## 2016-08-15 ENCOUNTER — Ambulatory Visit (INDEPENDENT_AMBULATORY_CARE_PROVIDER_SITE_OTHER): Payer: Medicare Other | Admitting: *Deleted

## 2016-08-15 DIAGNOSIS — Z5181 Encounter for therapeutic drug level monitoring: Secondary | ICD-10-CM | POA: Diagnosis not present

## 2016-08-15 DIAGNOSIS — I4891 Unspecified atrial fibrillation: Secondary | ICD-10-CM

## 2016-08-15 LAB — POCT INR: INR: 1.8

## 2016-08-28 ENCOUNTER — Ambulatory Visit (HOSPITAL_COMMUNITY)
Admission: EM | Admit: 2016-08-28 | Discharge: 2016-08-28 | Disposition: A | Discharge status: Nursing Home (Includes ICF) | Payer: Medicare Other

## 2016-08-28 ENCOUNTER — Encounter (HOSPITAL_COMMUNITY): Payer: Self-pay | Admitting: *Deleted

## 2016-08-28 ENCOUNTER — Emergency Department (HOSPITAL_COMMUNITY): Payer: Medicare Other

## 2016-08-28 ENCOUNTER — Emergency Department (HOSPITAL_COMMUNITY)
Admission: EM | Admit: 2016-08-28 | Discharge: 2016-08-28 | Disposition: A | Discharge status: Home / Self Care | Payer: Medicare Other | Attending: Emergency Medicine | Admitting: Emergency Medicine

## 2016-08-28 DIAGNOSIS — E860 Dehydration: Secondary | ICD-10-CM | POA: Insufficient documentation

## 2016-08-28 DIAGNOSIS — Z5181 Encounter for therapeutic drug level monitoring: Secondary | ICD-10-CM | POA: Diagnosis not present

## 2016-08-28 DIAGNOSIS — Z7901 Long term (current) use of anticoagulants: Secondary | ICD-10-CM | POA: Diagnosis not present

## 2016-08-28 DIAGNOSIS — R5383 Other fatigue: Secondary | ICD-10-CM | POA: Diagnosis present

## 2016-08-28 DIAGNOSIS — I5032 Chronic diastolic (congestive) heart failure: Secondary | ICD-10-CM | POA: Diagnosis not present

## 2016-08-28 DIAGNOSIS — N183 Chronic kidney disease, stage 3 (moderate): Secondary | ICD-10-CM | POA: Diagnosis not present

## 2016-08-28 DIAGNOSIS — E039 Hypothyroidism, unspecified: Secondary | ICD-10-CM | POA: Diagnosis not present

## 2016-08-28 DIAGNOSIS — I130 Hypertensive heart and chronic kidney disease with heart failure and stage 1 through stage 4 chronic kidney disease, or unspecified chronic kidney disease: Secondary | ICD-10-CM | POA: Insufficient documentation

## 2016-08-28 DIAGNOSIS — J40 Bronchitis, not specified as acute or chronic: Secondary | ICD-10-CM | POA: Diagnosis not present

## 2016-08-28 DIAGNOSIS — Z79899 Other long term (current) drug therapy: Secondary | ICD-10-CM | POA: Diagnosis not present

## 2016-08-28 LAB — URINALYSIS, ROUTINE W REFLEX MICROSCOPIC
Bilirubin Urine: NEGATIVE
GLUCOSE, UA: NEGATIVE mg/dL
Ketones, ur: NEGATIVE mg/dL
Nitrite: NEGATIVE
PROTEIN: NEGATIVE mg/dL
Specific Gravity, Urine: 1.01 (ref 1.005–1.030)
pH: 7 (ref 5.0–8.0)

## 2016-08-28 LAB — CBC
HEMATOCRIT: 33.5 % — AB (ref 36.0–46.0)
Hemoglobin: 10.8 g/dL — ABNORMAL LOW (ref 12.0–15.0)
MCH: 27.4 pg (ref 26.0–34.0)
MCHC: 32.2 g/dL (ref 30.0–36.0)
MCV: 85 fL (ref 78.0–100.0)
PLATELETS: 238 10*3/uL (ref 150–400)
RBC: 3.94 MIL/uL (ref 3.87–5.11)
RDW: 16.3 % — ABNORMAL HIGH (ref 11.5–15.5)
WBC: 8.9 10*3/uL (ref 4.0–10.5)

## 2016-08-28 LAB — URINALYSIS, MICROSCOPIC (REFLEX): SQUAMOUS EPITHELIAL / LPF: NONE SEEN

## 2016-08-28 LAB — BASIC METABOLIC PANEL
Anion gap: 13 (ref 5–15)
BUN: 37 mg/dL — ABNORMAL HIGH (ref 6–20)
CHLORIDE: 101 mmol/L (ref 101–111)
CO2: 26 mmol/L (ref 22–32)
CREATININE: 2 mg/dL — AB (ref 0.44–1.00)
Calcium: 9.2 mg/dL (ref 8.9–10.3)
GFR calc non Af Amer: 20 mL/min — ABNORMAL LOW (ref >60)
GFR, EST AFRICAN AMERICAN: 23 mL/min — AB (ref >60)
Glucose, Bld: 111 mg/dL — ABNORMAL HIGH (ref 65–99)
POTASSIUM: 3.7 mmol/L (ref 3.5–5.1)
SODIUM: 140 mmol/L (ref 135–145)

## 2016-08-28 LAB — I-STAT CG4 LACTIC ACID, ED
LACTIC ACID, VENOUS: 0.71 mmol/L (ref 0.5–1.9)
LACTIC ACID, VENOUS: 0.69 mmol/L (ref 0.5–1.9)

## 2016-08-28 LAB — PROTIME-INR
INR: 1.83
Prothrombin Time: 21.4 seconds — ABNORMAL HIGH (ref 11.4–15.2)

## 2016-08-28 MED ORDER — DOXYCYCLINE HYCLATE 100 MG PO TABS
100.0000 mg | ORAL_TABLET | Freq: Once | ORAL | Status: AC
  Administered 2016-08-28: 100 mg via ORAL
  Filled 2016-08-28: qty 1

## 2016-08-28 MED ORDER — ALBUTEROL SULFATE HFA 108 (90 BASE) MCG/ACT IN AERS
2.0000 | INHALATION_SPRAY | Freq: Once | RESPIRATORY_TRACT | Status: AC
  Administered 2016-08-28: 2 via RESPIRATORY_TRACT
  Filled 2016-08-28: qty 6.7

## 2016-08-28 MED ORDER — DOXYCYCLINE HYCLATE 100 MG PO CAPS: 100.0000 mg | ORAL_CAPSULE | Freq: Two times a day (BID) | ORAL | 0 refills | Status: AC

## 2016-08-28 MED ORDER — IPRATROPIUM-ALBUTEROL 0.5-2.5 (3) MG/3ML IN SOLN
3.0000 mL | Freq: Once | RESPIRATORY_TRACT | Status: AC
  Administered 2016-08-28: 3 mL via RESPIRATORY_TRACT
  Filled 2016-08-28: qty 3

## 2016-08-28 MED ORDER — SODIUM CHLORIDE 0.9 % IV BOLUS (SEPSIS)
500.0000 mL | Freq: Once | INTRAVENOUS | Status: AC
  Administered 2016-08-28: 500 mL via INTRAVENOUS

## 2016-08-28 MED ORDER — SODIUM CHLORIDE 0.9 % IV BOLUS (SEPSIS): 1000.0000 mL | Freq: Once | INTRAVENOUS | Status: DC

## 2016-08-28 NOTE — ED Triage Notes (Signed)
Pt sent here from UC for fatigue.  States sore throat on Fri that has since improved.  Also c/o generalized weakness. Family states heard wheezing,  But lung sounds clear in triage.  Denies changes in bowel or bladder habits.  Denies chest pain, sob or cough.

## 2016-08-28 NOTE — ED Provider Notes (Signed)
[facility] DEPT Provider Note   CSN: [id number] Arrival date & time: 08/28/16  1446     History   Chief Complaint Chief Complaint  Patient presents with  . Fatigue    HPI Tonya Farley is a 83 y.o. female.  HPI   83 year old female with past medical history of chronic diastolic heart failure and chronic kidney disease here with generalized fatigue. The patient states that her symptoms started 4 days ago with mild sore throat. Over the last 4 days, her throat sore throat has progressively improved but she has developed cough with wheezing and shortness of breath. Over the weekend, she attended her niece's wedding as well as went to church and she knows that she felt extremely tired after each of these events. Her appetite has been normal, however. Her family came to check on her today she lives alone and noticed that she felt more short of breath with walking to the car so she presents for evaluation. She does have a history of previous pneumonias in the past. She denies any sputum production. She has responded well to inhalers in the past according to her report. She also has a history of atrial fibrillation but denies any chest pain or shortness or palpitations. Has not recently been on ABX.  Past Medical History:  Diagnosis Date  . Chronic a-fib ([place])   . Chronic back pain   . Chronic diastolic (congestive) heart failure (HCC)    a. Echo 3/17: mild LVH, EF 55%, no RWMA, mod AS (mean 10 mmHg, peak 21 mmHg), MAC, mild MR, severe LAE, mod reduced RVSF, mild RAE, mod TR, PASP 65 mmHg  . CKD (chronic kidney disease) stage 3, GFR 30-59 ml/min 09/11/2015  . Hyperlipidemia   . Hypertension   . Hypothyroidism   . PNA (pneumonia)   . Pulmonary hypertension (HCC)    PASP 65 mmHg at Echo 3/17  . Thyroid disease     Patient Active Problem List   Diagnosis Date Noted  . Hypertensive heart disease 05/08/2016  . Chronic diastolic heart failure ([place]) 11/28/2015  . Anticoagulated  11/28/2015  . CKD (chronic kidney disease) stage 3, GFR 30-59 ml/min 09/11/2015  . Pulmonary hypertension ([place])   . Pleural effusion on left   . Peripheral edema   . Hypokalemia   . Anasarca 08/10/2015  . Acute on chronic respiratory failure with hypoxia ([place])   . Pleural effusion   . Physical deconditioning   . Cough   . HCAP (healthcare-associated pneumonia)   . Chest congestion   . SOB (shortness of breath)   . Swelling   . Acute on chronic diastolic heart failure ([place])   . HLD (hyperlipidemia)   . Influenza due to identified novel influenza A virus with other respiratory manifestations 06/07/2015  . Pleural effusion, bacterial   . Dyspnea   . AKI (acute kidney injury) ([place])   . CAP (community acquired pneumonia) 05/30/2015  . Renal artery stenosis ([place]) 08/08/2014  . Encounter for therapeutic drug monitoring 04/21/2013  . Hypertension 06/25/2012  . Hyperlipidemia 06/25/2012  . Atrial fibrillation ([place]) 06/25/2012  . Compression fracture of lumbar spine, non-traumatic ([place]) 06/25/2012  . Chronic back pain     Past Surgical History:  Procedure Laterality Date  . APPENDECTOMY    . cataracts     bilateral  . EYE SURGERY     left eye "hole"  . TONSILLECTOMY      OB History    No data available  Home Medications    Prior to Admission medications   Medication Sig Start Date End Date Taking? Authorizing Provider  atorvastatin (LIPITOR) 10 MG tablet Take 10 mg by mouth daily.   Yes [provider]  B Complex Vitamins (B COMPLEX 1 PO) Take 1 tablet by mouth daily.   Yes [provider]  brimonidine (ALPHAGAN) 0.15 % ophthalmic solution Place 1 drop into both eyes 3 (three) times daily.   Yes [provider]  cholecalciferol (VITAMIN D) 400 units TABS tablet Take 400 Units by mouth daily.   Yes [provider]  diltiazem (CARDIZEM CD) 360 MG 24 hr capsule Take 1 capsule (360 mg total) by mouth daily. 04/03/16  Yes Jettie Booze, MD  furosemide (LASIX) 80 MG tablet Take 80 mg in the morning and take 40 mg in afternoon. May increase to 80 mg BID for edema. Patient taking differently: 80 mg 2 (two) times daily.  11/29/15  Yes Jettie Booze, MD  isosorbide mononitrate (IMDUR) 30 MG 24 hr tablet Take 1 tablet (30 mg total) by mouth daily. 05/03/16 08/28/16 Yes Jettie Booze, MD  labetalol (NORMODYNE) 200 MG tablet TAKE 1 TABLET(200 MG) BY MOUTH TWICE DAILY 07/02/16  Yes Jettie Booze, MD  latanoprost (XALATAN) 0.005 % ophthalmic solution Place 1 drop into both eyes at bedtime. 01/10/14  Yes [provider]  levothyroxine (SYNTHROID, LEVOTHROID) 88 MCG tablet Take 88 mcg by mouth daily before breakfast.   Yes [provider]  Polyethyl Glycol-Propyl Glycol (SYSTANE OP) Apply 2 drops to eye daily as needed (dry eyes).    Yes [provider]  potassium chloride SA (K-DUR,KLOR-CON) 20 MEQ tablet Take 20 mEq by mouth daily.   Yes [provider]  timolol (TIMOPTIC) 0.5 % ophthalmic solution Place 1 drop into both eyes every morning.  12/28/13  Yes [provider]  warfarin (COUMADIN) 1 MG tablet TAKE 2.5 OR 3 TABLETS AS DIRECTED BY COUMADIN CLINIC Patient taking differently: TAKE 3 TABLETS on monday wednesday and friday AS DIRECTED BY COUMADIN CLINIC 06/13/16  Yes Jettie Booze, MD  albuterol (PROVENTIL) (5 MG/ML) 0.5% nebulizer solution Take 0.5 mLs (2.5 mg total) by nebulization every 6 (six) hours as needed for wheezing or shortness of breath. 03/19/16   Melanee Left, NP  doxycycline (VIBRAMYCIN) 100 MG capsule Take 1 capsule (100 mg total) by mouth 2 (two) times daily. 08/28/16 09/04/16  Duffy Bruce, MD  metolazone (ZAROXOLYN) 2.5 MG tablet Take 1 tablet (2.5 mg total) by mouth as needed (take if weight goes up 3 lbs in 1 day or 5 lbs in 1 week). Patient taking differently: Take 2.5 mg by mouth as needed (take if weight goes up 3 lbs in 1 day or 5  lbs in 1 week or fluid).  10/27/15   Richardson Dopp T, PA-C  predniSONE (STERAPRED UNI-PAK 21 TAB) 10 MG (21) TBPK tablet Take 1 tablet (10 mg total) by mouth daily. Take 6 tabs by mouth daily  for 2 days, then 5 tabs for 2 days, then 4 tabs for 2 days, then 3 tabs for 2 days, 2 tabs for 2 days, then 1 tab by mouth daily for 2 days Patient not taking: Reported on 08/28/2016 03/19/16   Melanee Left, NP    Family History Family History  Problem Relation Age of Onset  . Heart disease Sister   . Heart disease Sister   . Cancer Mother   . Other Father   .  Heart attack Father     Social History Social History  Substance Use Topics  . Smoking status: Never Smoker  . Smokeless tobacco: Never Used  . Alcohol use No     Allergies   Penicillins and Sulfa antibiotics   Review of Systems Review of Systems  Constitutional: Positive for fatigue. Negative for chills and fever.  HENT: Negative for congestion, rhinorrhea and sore throat.   Eyes: Negative for visual disturbance.  Respiratory: Positive for cough, shortness of breath and wheezing.   Cardiovascular: Negative for chest pain and leg swelling.  Gastrointestinal: Negative for abdominal pain, diarrhea, nausea and vomiting.  Genitourinary: Negative for dysuria, flank pain, vaginal bleeding and vaginal discharge.  Musculoskeletal: Negative for neck pain.  Skin: Negative for rash.  Allergic/Immunologic: Negative for immunocompromised state.  Neurological: Positive for weakness. Negative for syncope and headaches.  Hematological: Does not bruise/bleed easily.  All other systems reviewed and are negative.    Physical Exam Updated Vital Signs BP (!) 167/70   Pulse 90   Temp 98.6 F (37 C)   Resp (!) 21   Ht _0  (1.626 m)   Wt 64.4 kg (142 lb)   SpO2 93%   BMI 24.37 kg/m   Physical Exam  Constitutional: She is oriented to person, place, and time. She appears well-developed and well-nourished. No distress.  HENT:    Head: Normocephalic and atraumatic.  Moderately dry MM. Posterior pharyngeal erythema without tonsillar swelling or exudates.  Eyes: Conjunctivae are normal.  Neck: Neck supple.  Cardiovascular: Normal rate, regular rhythm and normal heart sounds.  Exam reveals no friction rub.   No murmur heard. Pulmonary/Chest: Effort normal. No respiratory distress. She has wheezes (scattered end-expiratory).  Abdominal: She exhibits no distension.  Musculoskeletal: She exhibits no edema.  Neurological: She is alert and oriented to person, place, and time. She exhibits normal muscle tone.  Skin: Skin is warm. Capillary refill takes less than 2 seconds.  Psychiatric: She has a normal mood and affect.  Nursing note and vitals reviewed.    ED Treatments / Results  Labs (all labs ordered are listed, but only abnormal results are displayed) Labs Reviewed  BASIC METABOLIC PANEL - Abnormal; Notable for the following:       Result Value   Glucose, Bld 111 (*)    BUN 37 (*)    Creatinine, Ser 2.00 (*)    GFR calc non Af Amer 20 (*)    GFR calc Af Amer 23 (*)    All other components within normal limits  CBC - Abnormal; Notable for the following:    Hemoglobin 10.8 (*)    HCT 33.5 (*)    RDW 16.3 (*)    All other components within normal limits  URINALYSIS, ROUTINE W REFLEX MICROSCOPIC - Abnormal; Notable for the following:    Color, Urine STRAW (*)    Hgb urine dipstick TRACE (*)    Leukocytes, UA TRACE (*)    All other components within normal limits  PROTIME-INR - Abnormal; Notable for the following:    Prothrombin Time 21.4 (*)    All other components within normal limits  URINALYSIS, MICROSCOPIC (REFLEX) - Abnormal; Notable for the following:    Bacteria, UA RARE (*)    All other components within normal limits  I-STAT CG4 LACTIC ACID, ED  I-STAT CG4 LACTIC ACID, ED    EKG  EKG Interpretation  Date/Time:  Wednesday August 28 2016 15:12:42 EDT Ventricular Rate:  66 PR Interval:  QRS Duration: 112 QT Interval:  428 QTC Calculation: 448 R Axis:   -66 Text Interpretation:  Atrial fibrillation Left anterior fascicular block Nonspecific ST and T wave abnormality , probably digitalis effect Abnormal ECG No significant change since last tracing Confirmed by Duffy Bruce [id number]) on 08/28/2016 4:43:58 PM       Radiology Dg Chest 2 View  Result Date: 08/28/2016 CLINICAL DATA:  Wheezing with dry cough for 2 weeks. EXAM: CHEST  2 VIEW COMPARISON:  03/19/2016 FINDINGS: Chronic cardiomegaly and mild aortic tortuosity. Chronic interstitial coarsening accentuated by bronchial calcification. There is no edema, consolidation, effusion, or pneumothorax. Remote L2 compression fracture with advanced height loss. Status post vertebroplasty. IMPRESSION: 1. No evidence of active disease. 2. Chronic cardiomegaly. Electronically Signed   By: Monte Fantasia M.D.   On: 08/28/2016 17:12    Procedures Procedures (including critical care time)  Medications Ordered in ED Medications  sodium chloride 0.9 % bolus 500 mL (0 mLs Intravenous Stopped 08/28/16 1949)  ipratropium-albuterol (DUONEB) 0.5-2.5 (3) MG/3ML nebulizer solution 3 mL (3 mLs Nebulization Given 08/28/16 1815)  doxycycline (VIBRA-TABS) tablet 100 mg (100 mg Oral Given 08/28/16 2121)  albuterol (PROVENTIL HFA;VENTOLIN HFA) 108 (90 Base) MCG/ACT inhaler 2 puff (2 puffs Inhalation Given 08/28/16 2121)     Initial Impression / Assessment and Plan / ED Course  I have reviewed the triage vital signs and the nursing notes.  Pertinent labs & imaging results that were available during my care of the patient were reviewed by me and considered in my medical decision making (see chart for details).     83 year old female with known sick contacts here with generalized fatigue in the setting of several days of cough and congestion. Patient has also recently gone to a wedding and had increased physical activity. On assessment, patient is  remarkably well-appearing and in no acute distress. Screening labwork shows very mild AKI with BUN and creatinine elevation. She has been given fluids for this but cautious fluids due to known CHF. Otherwise, she has no leukocytosis. Lactic acid is normal 2. Chest x-ray is without focal pneumonia and urinalysis shows no evidence of UTI. I suspect she has general fatigue due to increased activity over the weekend in the setting of URI. However, given her age and reported sputum production as well as mild wheezing, will place on doxycycline and albuterol and advise close PCP follow-up.  This note was prepared with assistance of Systems analyst. Occasional wrong-word or sound-a-like substitutions may have occurred due to the inherent limitations of voice recognition software.  Final Clinical Impressions(s) / ED Diagnoses   Final diagnoses:  Bronchitis  Dehydration  Subtherapeutic anticoagulation    New Prescriptions Discharge Medication List as of 08/28/2016  8:32 PM       Duffy Bruce, MD 08/29/16 1152

## 2016-08-28 NOTE — ED Notes (Signed)
ED Provider at bedside. 

## 2016-08-28 NOTE — Discharge Instructions (Signed)
Take the antibiotic as prescribed. You can use your inhaler every 4-6 hours as needed. I recommend using it every 6 hours for the next 24-48 hours then as needed.  Your INR was 1.8. This is mildly below the goal. You should follow-up with your doctor. You can call him tomorrow to discuss possible medication changes.  Drink plenty of fluids.

## 2016-08-28 NOTE — ED Notes (Signed)
bp  165   /78   Pulse  78      Pulse  Ox    96      evaulated by  Alisa GraffMichael  Pa   Send  Pt  To  Er

## 2016-08-28 NOTE — ED Notes (Signed)
Pt ambulated in hallway without difficulty. Pt states her legs did not feel weak as before.

## 2016-08-29 ENCOUNTER — Telehealth: Payer: Self-pay | Admitting: *Deleted

## 2016-08-29 NOTE — Telephone Encounter (Signed)
Pt was seen in the ER last night & was prescribed doxycycline 100mg  BID x 7 days. Advised that the medication does interfere with Coumadin & will thin the blood & we need to check her on Monday. Pt's INR was 1.83 INR the ER & since she is starting doxy which interacts did not change pt's Coumadin dose & explained to pt & dtr.  Dtr stated that the pt can't come on Monday because she does not have transportation until Thursday. Advised that we really need to check her INR on Monday & appt was set for Monday 09/02/16 at 3pm when someone can bring her. Dtr reminded me to note pt does prefer Thursdays for INR checks & I explained to her why she needs to come Monday for the INR check & she understood & will ensure pt is here on Monday.

## 2016-08-30 ENCOUNTER — Other Ambulatory Visit: Payer: Self-pay | Admitting: Interventional Cardiology

## 2016-09-02 ENCOUNTER — Ambulatory Visit (INDEPENDENT_AMBULATORY_CARE_PROVIDER_SITE_OTHER): Payer: Medicare Other | Admitting: *Deleted

## 2016-09-02 DIAGNOSIS — I4891 Unspecified atrial fibrillation: Secondary | ICD-10-CM | POA: Diagnosis not present

## 2016-09-02 DIAGNOSIS — Z5181 Encounter for therapeutic drug level monitoring: Secondary | ICD-10-CM

## 2016-09-02 LAB — POCT INR: INR: 2.6

## 2016-09-12 ENCOUNTER — Ambulatory Visit (INDEPENDENT_AMBULATORY_CARE_PROVIDER_SITE_OTHER): Payer: Medicare Other | Admitting: *Deleted

## 2016-09-12 DIAGNOSIS — Z5181 Encounter for therapeutic drug level monitoring: Secondary | ICD-10-CM | POA: Diagnosis not present

## 2016-09-12 DIAGNOSIS — I4891 Unspecified atrial fibrillation: Secondary | ICD-10-CM | POA: Diagnosis not present

## 2016-09-12 LAB — POCT INR: INR: 3

## 2016-09-17 ENCOUNTER — Emergency Department (HOSPITAL_COMMUNITY)
Admission: EM | Admit: 2016-09-17 | Discharge: 2016-09-17 | Disposition: A | Discharge status: Home / Self Care | Payer: Medicare Other | Attending: Emergency Medicine | Admitting: Emergency Medicine

## 2016-09-17 ENCOUNTER — Encounter (HOSPITAL_COMMUNITY): Payer: Self-pay

## 2016-09-17 DIAGNOSIS — E785 Hyperlipidemia, unspecified: Secondary | ICD-10-CM | POA: Diagnosis not present

## 2016-09-17 DIAGNOSIS — N183 Chronic kidney disease, stage 3 (moderate): Secondary | ICD-10-CM | POA: Diagnosis not present

## 2016-09-17 DIAGNOSIS — I4891 Unspecified atrial fibrillation: Secondary | ICD-10-CM | POA: Diagnosis not present

## 2016-09-17 DIAGNOSIS — E039 Hypothyroidism, unspecified: Secondary | ICD-10-CM | POA: Insufficient documentation

## 2016-09-17 DIAGNOSIS — I110 Hypertensive heart disease with heart failure: Secondary | ICD-10-CM | POA: Diagnosis not present

## 2016-09-17 DIAGNOSIS — I129 Hypertensive chronic kidney disease with stage 1 through stage 4 chronic kidney disease, or unspecified chronic kidney disease: Secondary | ICD-10-CM | POA: Diagnosis not present

## 2016-09-17 DIAGNOSIS — Z79899 Other long term (current) drug therapy: Secondary | ICD-10-CM | POA: Insufficient documentation

## 2016-09-17 DIAGNOSIS — I5032 Chronic diastolic (congestive) heart failure: Secondary | ICD-10-CM | POA: Insufficient documentation

## 2016-09-17 DIAGNOSIS — R112 Nausea with vomiting, unspecified: Secondary | ICD-10-CM | POA: Diagnosis present

## 2016-09-17 LAB — COMPREHENSIVE METABOLIC PANEL
ALK PHOS: 67 U/L (ref 38–126)
ALT: 19 U/L (ref 14–54)
ANION GAP: 12 (ref 5–15)
AST: 21 U/L (ref 15–41)
Albumin: 3.9 g/dL (ref 3.5–5.0)
BUN: 35 mg/dL — ABNORMAL HIGH (ref 6–20)
CO2: 27 mmol/L (ref 22–32)
Calcium: 9.6 mg/dL (ref 8.9–10.3)
Chloride: 98 mmol/L — ABNORMAL LOW (ref 101–111)
Creatinine, Ser: 1.98 mg/dL — ABNORMAL HIGH (ref 0.44–1.00)
GFR calc non Af Amer: 20 mL/min — ABNORMAL LOW (ref >60)
GFR, EST AFRICAN AMERICAN: 24 mL/min — AB (ref >60)
Glucose, Bld: 154 mg/dL — ABNORMAL HIGH (ref 65–99)
Potassium: 3.4 mmol/L — ABNORMAL LOW (ref 3.5–5.1)
SODIUM: 137 mmol/L (ref 135–145)
Total Bilirubin: 0.8 mg/dL (ref 0.3–1.2)
Total Protein: 6.7 g/dL (ref 6.5–8.1)

## 2016-09-17 LAB — TROPONIN I

## 2016-09-17 LAB — CBC WITH DIFFERENTIAL/PLATELET
BASOS ABS: 0 10*3/uL (ref 0.0–0.1)
Basophils Relative: 0 %
Eosinophils Absolute: 0.1 10*3/uL (ref 0.0–0.7)
Eosinophils Relative: 1 %
HEMATOCRIT: 36 % (ref 36.0–46.0)
HEMOGLOBIN: 11.5 g/dL — AB (ref 12.0–15.0)
Lymphocytes Relative: 5 %
Lymphs Abs: 0.6 10*3/uL — ABNORMAL LOW (ref 0.7–4.0)
MCH: 27.4 pg (ref 26.0–34.0)
MCHC: 31.9 g/dL (ref 30.0–36.0)
MCV: 85.9 fL (ref 78.0–100.0)
MONOS PCT: 7 %
Monocytes Absolute: 0.8 10*3/uL (ref 0.1–1.0)
NEUTROS ABS: 10.6 10*3/uL — AB (ref 1.7–7.7)
NEUTROS PCT: 87 %
Platelets: 204 10*3/uL (ref 150–400)
RBC: 4.19 MIL/uL (ref 3.87–5.11)
RDW: 16.7 % — ABNORMAL HIGH (ref 11.5–15.5)
WBC: 12.1 10*3/uL — AB (ref 4.0–10.5)

## 2016-09-17 LAB — URINALYSIS, ROUTINE W REFLEX MICROSCOPIC
BILIRUBIN URINE: NEGATIVE
Glucose, UA: NEGATIVE mg/dL
HGB URINE DIPSTICK: NEGATIVE
KETONES UR: NEGATIVE mg/dL
NITRITE: NEGATIVE
PH: 5 (ref 5.0–8.0)
Protein, ur: NEGATIVE mg/dL
Specific Gravity, Urine: 1.01 (ref 1.005–1.030)

## 2016-09-17 LAB — LIPASE, BLOOD: Lipase: 25 U/L (ref 11–51)

## 2016-09-17 MED ORDER — ONDANSETRON HCL 4 MG/2ML IJ SOLN
4.0000 mg | Freq: Once | INTRAMUSCULAR | Status: AC
  Administered 2016-09-17: 4 mg via INTRAVENOUS
  Filled 2016-09-17: qty 2

## 2016-09-17 MED ORDER — SODIUM CHLORIDE 0.9 % IV BOLUS (SEPSIS)
500.0000 mL | Freq: Once | INTRAVENOUS | Status: AC
  Administered 2016-09-17: 500 mL via INTRAVENOUS

## 2016-09-17 MED ORDER — ONDANSETRON 4 MG PO TBDP: 4.0000 mg | ORAL_TABLET | Freq: Three times a day (TID) | ORAL | 0 refills | Status: DC | PRN

## 2016-09-17 NOTE — ED Triage Notes (Signed)
Pt presents to the ed for complaints of sudden nausea that started 2 hours ago, denies any pain or any other symptoms.

## 2016-09-17 NOTE — ED Notes (Signed)
Placed pt on bedpan and encouraged her to void to give a sample.  No acute distress, pt has been resting, no further episodes of emesis

## 2016-09-17 NOTE — ED Provider Notes (Signed)
[facility] DEPT Provider Note   CSN: [id number] Arrival date & time: 09/17/16  1807     History   Chief Complaint Chief Complaint  Patient presents with  . Emesis    HPI Tonya Farley is a 83 y.o. female.  HPI Patient presents with nausea. States she's been feeling bad for last couple hours. No diarrhea. Has vomited. No chest pain. No fevers. No headache. States she felt a little bad when she woke up this morning but was able to meet with her friends. No headache. No confusion. No dysuria. No diarrhea.   Past Medical History:  Diagnosis Date  . Chronic a-fib ([place])   . Chronic back pain   . Chronic diastolic (congestive) heart failure (HCC)    a. Echo 3/17: mild LVH, EF 55%, no RWMA, mod AS (mean 10 mmHg, peak 21 mmHg), MAC, mild MR, severe LAE, mod reduced RVSF, mild RAE, mod TR, PASP 65 mmHg  . CKD (chronic kidney disease) stage 3, GFR 30-59 ml/min 09/11/2015  . Hyperlipidemia   . Hypertension   . Hypothyroidism   . PNA (pneumonia)   . Pulmonary hypertension (HCC)    PASP 65 mmHg at Echo 3/17  . Thyroid disease     Patient Active Problem List   Diagnosis Date Noted  . Hypertensive heart disease 05/08/2016  . Chronic diastolic heart failure ([place]) 11/28/2015  . Anticoagulated 11/28/2015  . CKD (chronic kidney disease) stage 3, GFR 30-59 ml/min 09/11/2015  . Pulmonary hypertension ([place])   . Pleural effusion on left   . Peripheral edema   . Hypokalemia   . Anasarca 08/10/2015  . Acute on chronic respiratory failure with hypoxia ([place])   . Pleural effusion   . Physical deconditioning   . Cough   . HCAP (healthcare-associated pneumonia)   . Chest congestion   . SOB (shortness of breath)   . Swelling   . Acute on chronic diastolic heart failure ([place])   . HLD (hyperlipidemia)   . Influenza due to identified novel influenza A virus with other respiratory manifestations 06/07/2015  . Pleural effusion, bacterial   . Dyspnea   . AKI (acute kidney injury) ([place])    . CAP (community acquired pneumonia) 05/30/2015  . Renal artery stenosis ([place]) 08/08/2014  . Encounter for therapeutic drug monitoring 04/21/2013  . Hypertension 06/25/2012  . Hyperlipidemia 06/25/2012  . Atrial fibrillation ([place]) 06/25/2012  . Compression fracture of lumbar spine, non-traumatic ([place]) 06/25/2012  . Chronic back pain     Past Surgical History:  Procedure Laterality Date  . APPENDECTOMY    . cataracts     bilateral  . EYE SURGERY     left eye "hole"  . TONSILLECTOMY      OB History    No data available       Home Medications    Prior to Admission medications   Medication Sig Start Date End Date Taking? Authorizing Provider  albuterol (PROVENTIL) (5 MG/ML) 0.5% nebulizer solution Take 0.5 mLs (2.5 mg total) by nebulization every 6 (six) hours as needed for wheezing or shortness of breath. 03/19/16   Melanee Left, NP  atorvastatin (LIPITOR) 10 MG tablet Take 10 mg by mouth daily.    [provider]  B Complex Vitamins (B COMPLEX 1 PO) Take 1 tablet by mouth daily.    [provider]  brimonidine (ALPHAGAN) 0.15 % ophthalmic solution Place 1 drop into both eyes 3 (three) times daily.    [provider]  cholecalciferol (  VITAMIN D) 400 units TABS tablet Take 400 Units by mouth daily.    [provider]  diltiazem (CARDIZEM CD) 360 MG 24 hr capsule Take 1 capsule (360 mg total) by mouth daily. 04/03/16   Jettie Booze, MD  furosemide (LASIX) 80 MG tablet Take 80 mg in the morning and take 40 mg in afternoon. May increase to 80 mg BID for edema. Patient taking differently: 80 mg 2 (two) times daily.  11/29/15   Jettie Booze, MD  isosorbide mononitrate (IMDUR) 30 MG 24 hr tablet Take 1 tablet (30 mg total) by mouth daily. 05/03/16 08/28/16  Jettie Booze, MD  labetalol (NORMODYNE) 200 MG tablet TAKE 1 TABLET(200 MG) BY MOUTH TWICE DAILY 07/02/16   Jettie Booze, MD  latanoprost (XALATAN) 0.005 %  ophthalmic solution Place 1 drop into both eyes at bedtime. 01/10/14   [provider]  levothyroxine (SYNTHROID, LEVOTHROID) 88 MCG tablet Take 88 mcg by mouth daily before breakfast.    [provider]  metolazone (ZAROXOLYN) 2.5 MG tablet Take 1 tablet (2.5 mg total) by mouth as needed (take if weight goes up 3 lbs in 1 day or 5 lbs in 1 week). Patient taking differently: Take 2.5 mg by mouth as needed (take if weight goes up 3 lbs in 1 day or 5 lbs in 1 week or fluid).  10/27/15   Richardson Dopp T, PA-C  ondansetron (ZOFRAN-ODT) 4 MG disintegrating tablet Take 1 tablet (4 mg total) by mouth every 8 (eight) hours as needed for nausea or vomiting. 09/17/16   Davonna Belling, MD  Polyethyl Glycol-Propyl Glycol (SYSTANE OP) Apply 2 drops to eye daily as needed (dry eyes).     [provider]  potassium chloride SA (K-DUR,KLOR-CON) 20 MEQ tablet Take 20 mEq by mouth daily.    [provider]  predniSONE (STERAPRED UNI-PAK 21 TAB) 10 MG (21) TBPK tablet Take 1 tablet (10 mg total) by mouth daily. Take 6 tabs by mouth daily  for 2 days, then 5 tabs for 2 days, then 4 tabs for 2 days, then 3 tabs for 2 days, 2 tabs for 2 days, then 1 tab by mouth daily for 2 days Patient not taking: Reported on 08/28/2016 03/19/16   Morley Kos A, NP  timolol (TIMOPTIC) 0.5 % ophthalmic solution Place 1 drop into both eyes every morning.  12/28/13   [provider]  warfarin (COUMADIN) 1 MG tablet TAKE 2.5 OR 3 TABLETS AS DIRECTED BY COUMADIN CLINIC 08/30/16   Jettie Booze, MD    Family History Family History  Problem Relation Age of Onset  . Heart disease Sister   . Heart disease Sister   . Cancer Mother   . Other Father   . Heart attack Father     Social History Social History  Substance Use Topics  . Smoking status: Never Smoker  . Smokeless tobacco: Never Used  . Alcohol use No     Allergies   Penicillins and Sulfa antibiotics   Review of  Systems Review of Systems  Constitutional: Positive for appetite change.  HENT: Negative for congestion.   Respiratory: Negative for shortness of breath.   Cardiovascular: Negative for chest pain.  Gastrointestinal: Positive for nausea and vomiting. Negative for abdominal pain.  Endocrine: Negative for polyuria.  Genitourinary: Negative for dysuria.  Musculoskeletal: Negative for back pain.  Neurological: Negative for seizures.  Hematological: Negative for adenopathy.  Psychiatric/Behavioral: Negative for confusion.     Physical Exam  Updated Vital Signs BP (!) 156/66   Pulse 75   Temp 97.5 F (36.4 C)   Resp 19   Wt 64.4 kg (142 lb)   SpO2 93%   BMI 24.37 kg/m   Physical Exam  Constitutional: She appears well-developed.  Patient has emesis bag in her lap.  HENT:  Head: Normocephalic.  Neck: Neck supple.  Cardiovascular: Normal rate.   Pulmonary/Chest: Effort normal.  Abdominal: There is no tenderness.  Musculoskeletal: Normal range of motion.  Neurological: She is alert.  Skin: Skin is warm.  Psychiatric: She has a normal mood and affect.     ED Treatments / Results  Labs (all labs ordered are listed, but only abnormal results are displayed) Labs Reviewed  COMPREHENSIVE METABOLIC PANEL - Abnormal; Notable for the following:       Result Value   Potassium 3.4 (*)    Chloride 98 (*)    Glucose, Bld 154 (*)    BUN 35 (*)    Creatinine, Ser 1.98 (*)    GFR calc non Af Amer 20 (*)    GFR calc Af Amer 24 (*)    All other components within normal limits  CBC WITH DIFFERENTIAL/PLATELET - Abnormal; Notable for the following:    WBC 12.1 (*)    Hemoglobin 11.5 (*)    RDW 16.7 (*)    Neutro Abs 10.6 (*)    Lymphs Abs 0.6 (*)    All other components within normal limits  URINALYSIS, ROUTINE W REFLEX MICROSCOPIC - Abnormal; Notable for the following:    APPearance HAZY (*)    Leukocytes, UA SMALL (*)    Bacteria, UA RARE (*)    Squamous Epithelial / LPF 0-5 (*)     All other components within normal limits  URINE CULTURE  TROPONIN I  LIPASE, BLOOD    EKG  EKG Interpretation  Date/Time:  Tuesday September 17 2016 18:45:38 EDT Ventricular Rate:  71 PR Interval:    QRS Duration: 123 QT Interval:  434 QTC Calculation: 472 R Axis:   -72 Text Interpretation:  Atrial fibrillation Nonspecific IVCD with LAD LVH with secondary repolarization abnormality Probable anterior infarct, age indeterminate No significant change since last tracing Confirmed by Alvino Chapel  MD, Ovid Curd [phone]) on 09/17/2016 7:32:44 PM       Radiology No results found.  Procedures Procedures (including critical care time)  Medications Ordered in ED Medications  ondansetron (ZOFRAN) injection 4 mg (4 mg Intravenous Given 09/17/16 1858)  sodium chloride 0.9 % bolus 500 mL (0 mLs Intravenous Stopped 09/17/16 2201)     Initial Impression / Assessment and Plan / ED Course  I have reviewed the triage vital signs and the nursing notes.  Pertinent labs & imaging results that were available during my care of the patient were reviewed by me and considered in my medical decision making (see chart for details).     Patient with nausea vomiting. Lightheaded during the episode. Feels better. Labs reassuring. Urine does have some white cells. Culture sent. Tolerated orals will discharge home. Does not sound as if it was a vertigo at bedtime.  Final Clinical Impressions(s) / ED Diagnoses   Final diagnoses:  Non-intractable vomiting with nausea, unspecified vomiting type    New Prescriptions New Prescriptions   ONDANSETRON (ZOFRAN-ODT) 4 MG DISINTEGRATING TABLET    Take 1 tablet (4 mg total) by mouth every 8 (eight) hours as needed for nausea or vomiting.     Davonna Belling, MD 09/17/16 2204

## 2016-09-17 NOTE — ED Notes (Signed)
Pt ate crackers and drank ginger ale without any further nausea.  Ambulated pt to the bathroom to void. No issues with ambulation

## 2016-09-17 NOTE — ED Notes (Signed)
Gave pt ginger ale and saltines 

## 2016-09-19 LAB — URINE CULTURE

## 2016-10-03 ENCOUNTER — Ambulatory Visit (INDEPENDENT_AMBULATORY_CARE_PROVIDER_SITE_OTHER): Payer: Medicare Other | Admitting: *Deleted

## 2016-10-03 DIAGNOSIS — I4891 Unspecified atrial fibrillation: Secondary | ICD-10-CM | POA: Diagnosis not present

## 2016-10-03 DIAGNOSIS — Z5181 Encounter for therapeutic drug level monitoring: Secondary | ICD-10-CM | POA: Diagnosis not present

## 2016-10-03 LAB — POCT INR: INR: 2.2

## 2016-10-07 ENCOUNTER — Encounter: Payer: Self-pay | Admitting: Interventional Cardiology

## 2016-10-08 ENCOUNTER — Telehealth: Payer: Self-pay | Admitting: Interventional Cardiology

## 2016-10-08 NOTE — Telephone Encounter (Signed)
New message   Pt niece is calling because she said she wants to go   over the Pentoxifylline medication and see if it is safe for pt to take

## 2016-10-08 NOTE — Telephone Encounter (Signed)
Attempted to call the patient's niece back. There was no answer. Dr. Eldridge DaceVaranasi has [facility].

## 2016-10-09 NOTE — Telephone Encounter (Signed)
Pt to pt's niece and advised no clinically significant interaction has been seen with pentoxifylline and warfarin. Pt's niece would prefer she be checked sooner with starting and advised we could accommodate that. She will call back for appt if patient chooses to start medication.

## 2016-10-28 ENCOUNTER — Other Ambulatory Visit: Payer: Self-pay

## 2016-10-28 MED ORDER — FUROSEMIDE 80 MG PO TABS: 80.0000 mg | ORAL_TABLET | Freq: Every day | ORAL | 1 refills | Status: DC

## 2016-10-28 NOTE — Telephone Encounter (Signed)
Called to speak with pt to about how she was taking Furosemide 80mg  tablet before it was refilled. Pt states that she is taking Furosemide 80mg  one tablet daily in the morning.

## 2016-10-29 MED ORDER — FUROSEMIDE 80 MG PO TABS: 80.0000 mg | ORAL_TABLET | Freq: Every day | ORAL | 1 refills | Status: DC

## 2016-10-29 NOTE — Addendum Note (Signed)
Addended by: Demetrios LollBARNARD, CATHY C on: 10/29/2016 11:47 AM   Modules accepted: Orders

## 2016-10-31 ENCOUNTER — Ambulatory Visit (INDEPENDENT_AMBULATORY_CARE_PROVIDER_SITE_OTHER): Payer: Medicare Other | Admitting: *Deleted

## 2016-10-31 DIAGNOSIS — Z5181 Encounter for therapeutic drug level monitoring: Secondary | ICD-10-CM

## 2016-10-31 DIAGNOSIS — I4891 Unspecified atrial fibrillation: Secondary | ICD-10-CM

## 2016-10-31 LAB — POCT INR: INR: 2.1

## 2016-11-27 ENCOUNTER — Telehealth: Payer: Self-pay | Admitting: Interventional Cardiology

## 2016-11-27 NOTE — Telephone Encounter (Signed)
New Message  BJ from Dr. Burton Apleyonald Roberts office call requesting to speak with RN about some issues pt is having. She would like to speak about getting pt in for a possible soon appt with Dr. Eldridge DaceVaranasi. Please call back to discuss

## 2016-11-28 NOTE — Telephone Encounter (Signed)
Called and spoke to patient. She states that she is feeling good this morning. She states that she slept good last night. She states that she has not checked her BP and HR this morning yet but she has taken her medicine. Patient states that she checked her BP and HR last night after taking her meds and it was 134/61 HR 76. Patient denies having any fluttering in her chest, any SOB, chest discomfort, or any other symptoms at this time. Advised for patient to continue to take her medications as prescribed and to let us know if her HR gets high again or if she starts to feel bad. Patient verbalized understanding. Called and made niece Darel HongJudy aware and she verbalized understanding as well.

## 2016-11-28 NOTE — Telephone Encounter (Signed)
Called and spoke to the patient's niece Darel HongJudy (DPR on file). She staetst hat the patient went in to see Dr. Su Hiltoberts yesterday and had not taken any of her morning medicines because she was having labs drawn. She states that the patient normally takes labetalol 200 mg BID and diltiazem 360 mg QD and had not taken either one. She state that the patient could tell that her HR was elevated yesterday morning and that she was having some SOB with walking at the appointment. She states that the patient did not get to take her medicines until 1 PM yesterday afternoon. Darel HongJudy felt like if the patient's HR was back to normal that she did not need to be seen right now. She advised for me to call the patient and see how she is feeling today.

## 2016-11-28 NOTE — Telephone Encounter (Signed)
Spoke with BJ from Dr. Su Hiltoberts office. She states that the patient was seen in the office yesterday and was in Afib with HR at 121. BJ states that Dr. Su Hiltoberts did not make any changes and felt like she was okay to send back home, but wanted to make our office aware. BJ asked that I call the patient's niece Darel HongJudy for further information.

## 2016-12-12 ENCOUNTER — Ambulatory Visit (INDEPENDENT_AMBULATORY_CARE_PROVIDER_SITE_OTHER): Payer: Medicare Other | Admitting: *Deleted

## 2016-12-12 ENCOUNTER — Telehealth: Payer: Self-pay | Admitting: Interventional Cardiology

## 2016-12-12 DIAGNOSIS — I4891 Unspecified atrial fibrillation: Secondary | ICD-10-CM | POA: Diagnosis not present

## 2016-12-12 DIAGNOSIS — Z5181 Encounter for therapeutic drug level monitoring: Secondary | ICD-10-CM | POA: Diagnosis not present

## 2016-12-12 LAB — PROTIME-INR
INR: 6.22 — AB
PROTHROMBIN TIME: 54.6 s — AB (ref 11.4–15.2)
Prothrombin Time: 106.4 s — ABNORMAL HIGH (ref 9.1–12.0)

## 2016-12-12 LAB — POCT INR: INR: >8

## 2016-12-12 NOTE — Telephone Encounter (Signed)
Spoke with pt & gave instructions, please refer to Anticoagulation Track on this date. Thanks

## 2016-12-12 NOTE — Telephone Encounter (Signed)
New message   Eunice Blase, verbalized that she is calling to give a Critical care report for Dr.Ross RN, spoke to rn routed to Triage

## 2016-12-12 NOTE — Telephone Encounter (Signed)
INR  Is 6.22 per lab .Wll forward to coumadin clinic for review ./cy

## 2016-12-13 ENCOUNTER — Other Ambulatory Visit: Payer: Medicare Other

## 2016-12-13 ENCOUNTER — Other Ambulatory Visit: Payer: Medicare Other | Admitting: *Deleted

## 2016-12-19 ENCOUNTER — Ambulatory Visit (INDEPENDENT_AMBULATORY_CARE_PROVIDER_SITE_OTHER): Payer: Medicare Other | Admitting: *Deleted

## 2016-12-19 DIAGNOSIS — Z5181 Encounter for therapeutic drug level monitoring: Secondary | ICD-10-CM

## 2016-12-19 DIAGNOSIS — I4891 Unspecified atrial fibrillation: Secondary | ICD-10-CM | POA: Diagnosis not present

## 2016-12-19 LAB — POCT INR: INR: 4.3

## 2016-12-22 ENCOUNTER — Emergency Department (HOSPITAL_COMMUNITY): Payer: Medicare Other

## 2016-12-22 ENCOUNTER — Other Ambulatory Visit: Payer: Self-pay | Admitting: Interventional Cardiology

## 2016-12-22 ENCOUNTER — Inpatient Hospital Stay (HOSPITAL_COMMUNITY)
Admission: EM | Admit: 2016-12-22 | Discharge: 2016-12-27 | DRG: 291 | Disposition: A | Discharge status: Home Health | Payer: Medicare Other | Attending: Internal Medicine | Admitting: Internal Medicine

## 2016-12-22 ENCOUNTER — Encounter (HOSPITAL_COMMUNITY): Payer: Self-pay | Admitting: Emergency Medicine

## 2016-12-22 DIAGNOSIS — Z882 Allergy status to sulfonamides status: Secondary | ICD-10-CM

## 2016-12-22 DIAGNOSIS — E876 Hypokalemia: Secondary | ICD-10-CM | POA: Diagnosis not present

## 2016-12-22 DIAGNOSIS — L03119 Cellulitis of unspecified part of limb: Secondary | ICD-10-CM | POA: Insufficient documentation

## 2016-12-22 DIAGNOSIS — N183 Chronic kidney disease, stage 3 unspecified: Secondary | ICD-10-CM | POA: Diagnosis present

## 2016-12-22 DIAGNOSIS — I272 Pulmonary hypertension, unspecified: Secondary | ICD-10-CM | POA: Diagnosis present

## 2016-12-22 DIAGNOSIS — Z880 Allergy status to penicillin: Secondary | ICD-10-CM

## 2016-12-22 DIAGNOSIS — I5031 Acute diastolic (congestive) heart failure: Secondary | ICD-10-CM | POA: Diagnosis present

## 2016-12-22 DIAGNOSIS — I5022 Chronic systolic (congestive) heart failure: Secondary | ICD-10-CM

## 2016-12-22 DIAGNOSIS — J9811 Atelectasis: Secondary | ICD-10-CM | POA: Diagnosis present

## 2016-12-22 DIAGNOSIS — Z79890 Hormone replacement therapy (postmenopausal): Secondary | ICD-10-CM

## 2016-12-22 DIAGNOSIS — D638 Anemia in other chronic diseases classified elsewhere: Secondary | ICD-10-CM | POA: Diagnosis present

## 2016-12-22 DIAGNOSIS — E785 Hyperlipidemia, unspecified: Secondary | ICD-10-CM | POA: Diagnosis present

## 2016-12-22 DIAGNOSIS — I50813 Acute on chronic right heart failure: Secondary | ICD-10-CM | POA: Diagnosis not present

## 2016-12-22 DIAGNOSIS — I701 Atherosclerosis of renal artery: Secondary | ICD-10-CM | POA: Diagnosis present

## 2016-12-22 DIAGNOSIS — I130 Hypertensive heart and chronic kidney disease with heart failure and stage 1 through stage 4 chronic kidney disease, or unspecified chronic kidney disease: Principal | ICD-10-CM | POA: Diagnosis present

## 2016-12-22 DIAGNOSIS — N390 Urinary tract infection, site not specified: Secondary | ICD-10-CM | POA: Diagnosis present

## 2016-12-22 DIAGNOSIS — Z9842 Cataract extraction status, left eye: Secondary | ICD-10-CM

## 2016-12-22 DIAGNOSIS — I482 Chronic atrial fibrillation, unspecified: Secondary | ICD-10-CM | POA: Diagnosis present

## 2016-12-22 DIAGNOSIS — R778 Other specified abnormalities of plasma proteins: Secondary | ICD-10-CM | POA: Diagnosis present

## 2016-12-22 DIAGNOSIS — I082 Rheumatic disorders of both aortic and tricuspid valves: Secondary | ICD-10-CM | POA: Diagnosis present

## 2016-12-22 DIAGNOSIS — Z8249 Family history of ischemic heart disease and other diseases of the circulatory system: Secondary | ICD-10-CM

## 2016-12-22 DIAGNOSIS — E860 Dehydration: Secondary | ICD-10-CM | POA: Diagnosis present

## 2016-12-22 DIAGNOSIS — R531 Weakness: Secondary | ICD-10-CM | POA: Diagnosis not present

## 2016-12-22 DIAGNOSIS — I5033 Acute on chronic diastolic (congestive) heart failure: Secondary | ICD-10-CM | POA: Diagnosis present

## 2016-12-22 DIAGNOSIS — Z66 Do not resuscitate: Secondary | ICD-10-CM | POA: Diagnosis present

## 2016-12-22 DIAGNOSIS — R011 Cardiac murmur, unspecified: Secondary | ICD-10-CM | POA: Diagnosis present

## 2016-12-22 DIAGNOSIS — Z7901 Long term (current) use of anticoagulants: Secondary | ICD-10-CM

## 2016-12-22 DIAGNOSIS — Z9841 Cataract extraction status, right eye: Secondary | ICD-10-CM

## 2016-12-22 DIAGNOSIS — E039 Hypothyroidism, unspecified: Secondary | ICD-10-CM | POA: Diagnosis present

## 2016-12-22 DIAGNOSIS — I872 Venous insufficiency (chronic) (peripheral): Secondary | ICD-10-CM | POA: Diagnosis present

## 2016-12-22 DIAGNOSIS — R7989 Other specified abnormal findings of blood chemistry: Secondary | ICD-10-CM | POA: Diagnosis present

## 2016-12-22 DIAGNOSIS — Z79899 Other long term (current) drug therapy: Secondary | ICD-10-CM

## 2016-12-22 DIAGNOSIS — R8290 Unspecified abnormal findings in urine: Secondary | ICD-10-CM | POA: Diagnosis present

## 2016-12-22 DIAGNOSIS — W19XXXA Unspecified fall, initial encounter: Secondary | ICD-10-CM | POA: Diagnosis present

## 2016-12-22 DIAGNOSIS — Z91018 Allergy to other foods: Secondary | ICD-10-CM

## 2016-12-22 DIAGNOSIS — I10 Essential (primary) hypertension: Secondary | ICD-10-CM | POA: Diagnosis present

## 2016-12-22 LAB — CBC WITH DIFFERENTIAL/PLATELET
BASOS PCT: 0 %
Basophils Absolute: 0 10*3/uL (ref 0.0–0.1)
EOS ABS: 0.1 10*3/uL (ref 0.0–0.7)
EOS PCT: 1 %
HCT: 39.3 % (ref 36.0–46.0)
Hemoglobin: 12.2 g/dL (ref 12.0–15.0)
LYMPHS ABS: 0.6 10*3/uL — AB (ref 0.7–4.0)
Lymphocytes Relative: 6 %
MCH: 25.7 pg — AB (ref 26.0–34.0)
MCHC: 31 g/dL (ref 30.0–36.0)
MCV: 82.7 fL (ref 78.0–100.0)
Monocytes Absolute: 0.9 10*3/uL (ref 0.1–1.0)
Monocytes Relative: 10 %
Neutro Abs: 7.8 10*3/uL — ABNORMAL HIGH (ref 1.7–7.7)
Neutrophils Relative %: 83 %
PLATELETS: 214 10*3/uL (ref 150–400)
RBC: 4.75 MIL/uL (ref 3.87–5.11)
RDW: 17.3 % — ABNORMAL HIGH (ref 11.5–15.5)
WBC: 9.4 10*3/uL (ref 4.0–10.5)

## 2016-12-22 LAB — URINALYSIS, ROUTINE W REFLEX MICROSCOPIC
Bacteria, UA: NONE SEEN
Bilirubin Urine: NEGATIVE
GLUCOSE, UA: NEGATIVE mg/dL
HGB URINE DIPSTICK: NEGATIVE
Ketones, ur: NEGATIVE mg/dL
Nitrite: NEGATIVE
Protein, ur: 30 mg/dL — AB
SPECIFIC GRAVITY, URINE: 1.012 (ref 1.005–1.030)
pH: 5 (ref 5.0–8.0)

## 2016-12-22 LAB — COMPREHENSIVE METABOLIC PANEL
ALBUMIN: 3.3 g/dL — AB (ref 3.5–5.0)
ALT: 14 U/L (ref 14–54)
AST: 15 U/L (ref 15–41)
Alkaline Phosphatase: 59 U/L (ref 38–126)
Anion gap: 8 (ref 5–15)
BUN: 24 mg/dL — ABNORMAL HIGH (ref 6–20)
CHLORIDE: 103 mmol/L (ref 101–111)
CO2: 29 mmol/L (ref 22–32)
CREATININE: 1.56 mg/dL — AB (ref 0.44–1.00)
Calcium: 9.4 mg/dL (ref 8.9–10.3)
GFR calc non Af Amer: 27 mL/min — ABNORMAL LOW (ref >60)
GFR, EST AFRICAN AMERICAN: 31 mL/min — AB (ref >60)
GLUCOSE: 116 mg/dL — AB (ref 65–99)
Potassium: 3.2 mmol/L — ABNORMAL LOW (ref 3.5–5.1)
SODIUM: 140 mmol/L (ref 135–145)
Total Bilirubin: 1 mg/dL (ref 0.3–1.2)
Total Protein: 6.8 g/dL (ref 6.5–8.1)

## 2016-12-22 LAB — TSH: TSH: 1.438 u[IU]/mL (ref 0.350–4.500)

## 2016-12-22 LAB — PROTIME-INR
INR: 2.62
PROTHROMBIN TIME: 27.8 s — AB (ref 11.4–15.2)

## 2016-12-22 LAB — BRAIN NATRIURETIC PEPTIDE: B Natriuretic Peptide: 851 pg/mL — ABNORMAL HIGH (ref 0.0–100.0)

## 2016-12-22 LAB — TROPONIN I: Troponin I: 0.03 ng/mL (ref <0.03)

## 2016-12-22 MED ORDER — DILTIAZEM HCL 30 MG PO TABS
30.0000 mg | ORAL_TABLET | Freq: Four times a day (QID) | ORAL | Status: DC
  Administered 2016-12-22 – 2016-12-23 (×3): 30 mg via ORAL
  Filled 2016-12-22 (×3): qty 1

## 2016-12-22 MED ORDER — SODIUM CHLORIDE 0.9 % IV BOLUS (SEPSIS)
1000.0000 mL | Freq: Once | INTRAVENOUS | Status: AC
  Administered 2016-12-22: 1000 mL via INTRAVENOUS

## 2016-12-22 MED ORDER — SODIUM CHLORIDE 0.9% FLUSH
3.0000 mL | Freq: Two times a day (BID) | INTRAVENOUS | Status: DC
  Administered 2016-12-22 – 2016-12-26 (×8): 3 mL via INTRAVENOUS

## 2016-12-22 MED ORDER — POLYETHYL GLYCOL-PROPYL GLYCOL 0.4-0.3 % OP GEL: Freq: Every day | OPHTHALMIC | Status: DC | PRN

## 2016-12-22 MED ORDER — DILTIAZEM HCL ER COATED BEADS 180 MG PO CP24: 360.0000 mg | ORAL_CAPSULE | Freq: Every day | ORAL | Status: DC

## 2016-12-22 MED ORDER — LATANOPROST 0.005 % OP SOLN
1.0000 [drp] | Freq: Every day | OPHTHALMIC | Status: DC
  Administered 2016-12-22 – 2016-12-26 (×5): 1 [drp] via OPHTHALMIC
  Filled 2016-12-22: qty 2.5

## 2016-12-22 MED ORDER — CIPROFLOXACIN IN D5W 400 MG/200ML IV SOLN
400.0000 mg | INTRAVENOUS | Status: DC
  Administered 2016-12-22: 400 mg via INTRAVENOUS
  Filled 2016-12-22: qty 200

## 2016-12-22 MED ORDER — LABETALOL HCL 200 MG PO TABS: 200.0000 mg | ORAL_TABLET | Freq: Two times a day (BID) | ORAL | Status: DC

## 2016-12-22 MED ORDER — ISOSORBIDE MONONITRATE ER 30 MG PO TB24: 30.0000 mg | ORAL_TABLET | Freq: Every day | ORAL | Status: DC

## 2016-12-22 MED ORDER — ALBUTEROL SULFATE (5 MG/ML) 0.5% IN NEBU: 2.5000 mg | INHALATION_SOLUTION | Freq: Four times a day (QID) | RESPIRATORY_TRACT | Status: DC | PRN

## 2016-12-22 MED ORDER — TIMOLOL MALEATE 0.5 % OP SOLN
1.0000 [drp] | Freq: Every morning | OPHTHALMIC | Status: DC
  Administered 2016-12-23 – 2016-12-27 (×5): 1 [drp] via OPHTHALMIC
  Filled 2016-12-22: qty 5

## 2016-12-22 MED ORDER — ACETAMINOPHEN 325 MG PO TABS: 650.0000 mg | ORAL_TABLET | ORAL | Status: DC | PRN

## 2016-12-22 MED ORDER — POTASSIUM CHLORIDE CRYS ER 20 MEQ PO TBCR
40.0000 meq | EXTENDED_RELEASE_TABLET | Freq: Two times a day (BID) | ORAL | Status: AC
  Administered 2016-12-22 – 2016-12-23 (×3): 40 meq via ORAL
  Filled 2016-12-22 (×3): qty 2

## 2016-12-22 MED ORDER — LEVOTHYROXINE SODIUM 88 MCG PO TABS
88.0000 ug | ORAL_TABLET | Freq: Every day | ORAL | Status: DC
  Administered 2016-12-23 – 2016-12-27 (×5): 88 ug via ORAL
  Filled 2016-12-22 (×5): qty 1

## 2016-12-22 MED ORDER — ALBUTEROL SULFATE (2.5 MG/3ML) 0.083% IN NEBU: 2.5000 mg | INHALATION_SOLUTION | Freq: Four times a day (QID) | RESPIRATORY_TRACT | Status: DC | PRN

## 2016-12-22 MED ORDER — POLYVINYL ALCOHOL 1.4 % OP SOLN
1.0000 [drp] | Freq: Every day | OPHTHALMIC | Status: DC | PRN
  Administered 2016-12-24: 1 [drp] via OPHTHALMIC
  Filled 2016-12-22: qty 15

## 2016-12-22 MED ORDER — BRIMONIDINE TARTRATE 0.15 % OP SOLN
1.0000 [drp] | Freq: Three times a day (TID) | OPHTHALMIC | Status: DC
  Administered 2016-12-22 – 2016-12-27 (×15): 1 [drp] via OPHTHALMIC
  Filled 2016-12-22: qty 5

## 2016-12-22 MED ORDER — ATORVASTATIN CALCIUM 10 MG PO TABS
10.0000 mg | ORAL_TABLET | Freq: Every day | ORAL | Status: DC
  Administered 2016-12-22 – 2016-12-26 (×5): 10 mg via ORAL
  Filled 2016-12-22 (×5): qty 1

## 2016-12-22 MED ORDER — PENTOXIFYLLINE ER 400 MG PO TBCR
400.0000 mg | EXTENDED_RELEASE_TABLET | Freq: Three times a day (TID) | ORAL | Status: DC
  Administered 2016-12-22 – 2016-12-27 (×15): 400 mg via ORAL
  Filled 2016-12-22 (×17): qty 1

## 2016-12-22 MED ORDER — FUROSEMIDE 10 MG/ML IJ SOLN
80.0000 mg | Freq: Two times a day (BID) | INTRAMUSCULAR | Status: AC
  Administered 2016-12-22 – 2016-12-23 (×3): 80 mg via INTRAVENOUS
  Filled 2016-12-22 (×3): qty 8

## 2016-12-22 MED ORDER — CLINDAMYCIN PHOSPHATE 300 MG/50ML IV SOLN
300.0000 mg | Freq: Once | INTRAVENOUS | Status: DC
  Filled 2016-12-22: qty 50

## 2016-12-22 MED ORDER — FUROSEMIDE 20 MG PO TABS
80.0000 mg | ORAL_TABLET | Freq: Every day | ORAL | Status: DC
  Administered 2016-12-24 – 2016-12-25 (×2): 80 mg via ORAL
  Filled 2016-12-22 (×2): qty 4

## 2016-12-22 MED ORDER — WARFARIN - PHARMACIST DOSING INPATIENT
Freq: Every day | Status: DC
  Administered 2016-12-23 – 2016-12-26 (×2)

## 2016-12-22 MED ORDER — ONDANSETRON HCL 4 MG/2ML IJ SOLN
4.0000 mg | Freq: Four times a day (QID) | INTRAMUSCULAR | Status: DC | PRN
  Administered 2016-12-23: 4 mg via INTRAVENOUS
  Filled 2016-12-22: qty 2

## 2016-12-22 MED ORDER — SODIUM CHLORIDE 0.9 % IV SOLN
250.0000 mL | INTRAVENOUS | Status: DC | PRN
Start: 2016-12-22 — End: 2016-12-27

## 2016-12-22 MED ORDER — SODIUM CHLORIDE 0.9% FLUSH: 3.0000 mL | INTRAVENOUS | Status: DC | PRN

## 2016-12-22 MED ORDER — WARFARIN SODIUM 2.5 MG PO TABS
2.5000 mg | ORAL_TABLET | Freq: Once | ORAL | Status: AC
  Administered 2016-12-22: 2.5 mg via ORAL
  Filled 2016-12-22: qty 1

## 2016-12-22 MED ORDER — FUROSEMIDE 10 MG/ML IJ SOLN
40.0000 mg | Freq: Once | INTRAMUSCULAR | Status: AC
  Administered 2016-12-22: 40 mg via INTRAVENOUS
  Filled 2016-12-22: qty 4

## 2016-12-22 NOTE — ED Notes (Signed)
RN attempted IV and lab draw. Difficult stick. Requested IV team at this time

## 2016-12-22 NOTE — ED Triage Notes (Signed)
Pt has not felt well for two weeks. Pt having ongoing weakness, dizziness for four days. Pt has had a decreased appetite. Pt's INR was 8 two weeks ago when she went to have it checked. Pt's meds have been adjusted to help lower- its now at 6. No bleeding noted from pt. Denies pain. BP 172/84, Hx of Afib, HR 112. 95% on room air. CBG 130

## 2016-12-22 NOTE — Progress Notes (Signed)
ANTICOAGULATION CONSULT NOTE - Initial Consult  Pharmacy Consult for Warfarin Indication: atrial fibrillation  Allergies  Allergen Reactions  . Penicillins Hives and Swelling    Has patient had a PCN reaction causing immediate rash, facial/tongue/throat swelling, SOB or lightheadedness with hypotension: Yes Has patient had a PCN reaction causing severe rash involving mucus membranes or skin necrosis: Yes Has patient had a PCN reaction that required hospitalization No Has patient had a PCN reaction occurring within the last 10 years: No If all of the above answers are "NO", then may proceed with Cephalosporin use.    . Sulfa Antibiotics Anaphylaxis and Swelling  . Fish Allergy Nausea And Vomiting    Patient Measurements: Height: 5' 4"  (162.6 cm) Weight: 136 lb (61.7 kg) IBW/kg (Calculated) : 54.7  Vital Signs: Temp: 98.1 F (36.7 C) (09/30 0915) Temp Source: Oral (09/30 0915) BP: 171/98 (09/30 1345) Pulse Rate: 81 (09/30 1345)  Labs:  Recent Labs  12/22/16 1153  HGB 12.2  HCT 39.3  PLT 214  LABPROT 27.8*  INR 2.62  CREATININE 1.56*  TROPONINI 0.03*    Estimated Creatinine Clearance: 18.6 mL/min (A) (by C-G formula based on SCr of 1.56 mg/dL (H)).   Medical History: Past Medical History:  Diagnosis Date  . Chronic a-fib ([place])   . Chronic back pain   . Chronic diastolic (congestive) heart failure (HCC)    a. Echo 3/17: mild LVH, EF 55%, no RWMA, mod AS (mean 10 mmHg, peak 21 mmHg), MAC, mild MR, severe LAE, mod reduced RVSF, mild RAE, mod TR, PASP 65 mmHg  . CKD (chronic kidney disease) stage 3, GFR 30-59 ml/min 09/11/2015  . Hyperlipidemia   . Hypertension   . Hypothyroidism   . PNA (pneumonia)   . Pulmonary hypertension (HCC)    PASP 65 mmHg at Echo 3/17  . Thyroid disease     Medications:  Home Warfarin regimen: 38m on Wednesdays and 2.532mall other days  *This dose was just lowered on 9/27 from 40m31mhree times a week and 2.5mg740ml other due to an INR  of 4.3 (which had come down after an INR 6.22 the week prior for which warfarin was held for 2 days then resuming same [facility]).   Assessment: 89 y68r old female on chronic warfarin for atrial fibrillation. Her INR today 2.62 today - therapeutic on current home regimen. Last dose per patient was 12/21/16 at 1800. CBC is within normal limits. No bleeding reported.   Note drug interaction with Ciprofloxacin added for UTI.  Goal of Therapy:  INR 2-3 Monitor platelets by anticoagulation protocol: Yes   Plan:  Warfarin 2.5mg 67mx1 today Monitor daily INR for now due to interacting medication.   JessiSloan LeiterrmD, BCPS Clinical Pharmacist Clinical phone 12/22/2016 until 3:30PM - #[phone] hours, please call #[phone]/2018,2:19 PM

## 2016-12-22 NOTE — H&P (Signed)
History and Physical    Tonya Farley:[id number] DOB: Aug 12, 1919 DOA: 12/22/2016   PCP: Lorene Dy, MD   Attending physician: Dhungel  Patient coming from/Resides with: Private residence/alone and still drives  Chief Complaint: Generalized weakness  HPI: Tonya Farley is a 83 y.o. female with medical history significant for chronic diastolic and right heart failure in setting of severe pulmonary hypertension with moderate tricuspid regurgitation, chronic atrial fibrillation on anticoagulation, stage 3-4 chronic kidney disease in the setting of renal artery stenosis, dyslipidemia and hypertension. Patient reports generalized malaise for 2 weeks with progression to weakness and dizziness for the past 4 days with associated anorexia. She is had difficulty with supratherapeutic INRs over the past 2 weeks with a reading as high as 8. No signs of bleeding reported by patient. She reports an awareness of increasing frequency of palpitations. She's not had any infectious symptoms such as fevers or chills, cough. She has not had any abdominal pain, nausea, vomiting or diarrhea. She's not had any orthopnea, shortness of breath or dyspnea on exertion. She has noticed increasing lower extremity edema especially in the feet and she has noticed a very slightly but not indurated or erythematous area on the left medial malleolus.  ED Course:  Vital Signs: BP (!) 171/98   Pulse 81   Temp 98.1 F (36.7 C) (Oral)   Resp (!) 22   Ht 5' 4" (1.626 m)   Wt 61.7 kg (136 lb)   SpO2 96%   BMI 23.34 kg/m  Chest x-ray: It is cardiomegaly without evidence of acute cardiopulmonary disease. Compared with previous x-ray she appears to have fine curly B markings as well as a fine latticelike pattern in the bases consistent with edema CT head without contrast: No acute changes Lab data: Sodium 140, potassium 3.2, chloride 103, CO2 29, glucose 116, BUN 24, creatinine 1.56, anion gap 8, LFTs not elevated,  BNP 851, troponin 0.03, white count 9400 with neutrophils 83% and absolute neutrophils 7.8%, hemoglobin 12.2, platelets 214,000, PT 27.8, INR 2.62, urinalysis abnormal with small amount leukocyte and 6030 WBCs Medications and treatments: Normal saline bolus 1 L (on clinical exam EDP felt patient appeared dehydrated/dry oral mucous membranes)  Review of Systems:  In addition to the HPI above,  No Fever-chills, myalgias or other constitutional symptoms No Headache, changes with Vision or hearing, new weakness, tingling, numbness in any extremity, dysarthria or word finding difficulty, gait disturbance or imbalance, tremors or seizure activity No problems swallowing food or Liquids, indigestion/reflux, choking or coughing while eating, abdominal pain with or after eating No Chest pain, Cough or Shortness of Breath, orthopnea or DOE No Abdominal pain, N/V, melena,hematochezia, dark tarry stools, constipation No dysuria, malodorous urine, hematuria or flank pain No new skin rashes, lesions, masses or bruises, No new joint pains, aches, swelling or redness No recent unintentional weight gain or loss No polyuria, polydypsia or polyphagia   Past Medical History:  Diagnosis Date  . Chronic a-fib ([place])   . Chronic back pain   . Chronic diastolic (congestive) heart failure (HCC)    a. Echo 3/17: mild LVH, EF 55%, no RWMA, mod AS (mean 10 mmHg, peak 21 mmHg), MAC, mild MR, severe LAE, mod reduced RVSF, mild RAE, mod TR, PASP 65 mmHg  . CKD (chronic kidney disease) stage 3, GFR 30-59 ml/min 09/11/2015  . Hyperlipidemia   . Hypertension   . Hypothyroidism   . PNA (pneumonia)   . Pulmonary hypertension (HCC)    PASP 65 mmHg  at Echo 3/17  . Thyroid disease     Past Surgical History:  Procedure Laterality Date  . APPENDECTOMY    . cataracts     bilateral  . EYE SURGERY     left eye "hole"  . TONSILLECTOMY      Social History   Social History  . Marital status: Widowed    Spouse name:  N/A  . Number of children: N/A  . Years of education: N/A   Occupational History  . Not on file.   Social History Main Topics  . Smoking status: Never Smoker  . Smokeless tobacco: Never Used  . Alcohol use No  . Drug use: No  . Sexual activity: Not on file   Other Topics Concern  . Not on file   Social History Narrative  . No narrative on file    Mobility: Utilizes a cane and rolling walker Work history: Not obtained   Allergies  Allergen Reactions  . Penicillins Hives and Swelling      . Sulfa Antibiotics Anaphylaxis and Swelling  . Fish Allergy Nausea And Vomiting    Family History  Problem Relation Age of Onset  . Heart disease Sister   . Heart disease Sister   . Cancer Mother   . Other Father   . Heart attack Father      Prior to Admission medications   Medication Sig Start Date End Date Taking? Authorizing Provider  atorvastatin (LIPITOR) 10 MG tablet Take 10 mg by mouth daily.   Yes [provider]  B Complex Vitamins (B COMPLEX 1 PO) Take 1 tablet by mouth daily.   Yes [provider]  brimonidine (ALPHAGAN) 0.15 % ophthalmic solution Place 1 drop into both eyes 3 (three) times daily.   Yes [provider]  cholecalciferol (VITAMIN D) 400 units TABS tablet Take 400 Units by mouth daily.   Yes [provider]  diltiazem (CARDIZEM CD) 360 MG 24 hr capsule Take 1 capsule (360 mg total) by mouth daily. 04/03/16  Yes Jettie Booze, MD  furosemide (LASIX) 80 MG tablet Take 1 tablet (80 mg total) by mouth daily. 10/29/16  Yes Jettie Booze, MD  isosorbide mononitrate (IMDUR) 30 MG 24 hr tablet Take 1 tablet (30 mg total) by mouth daily. 05/03/16 12/22/16 Yes Jettie Booze, MD  labetalol (NORMODYNE) 200 MG tablet TAKE 1 TABLET(200 MG) BY MOUTH TWICE DAILY 07/02/16  Yes Jettie Booze, MD  latanoprost (XALATAN) 0.005 % ophthalmic solution Place 1 drop into both eyes at bedtime. 01/10/14  Yes [provider]  levothyroxine (SYNTHROID, LEVOTHROID) 88 MCG tablet Take 88 mcg by mouth daily before breakfast.   Yes [provider]  pentoxifylline (TRENTAL) 400 MG CR tablet Take 400 mg by mouth 3 (three) times daily with meals.   Yes [provider]  Polyethyl Glycol-Propyl Glycol (SYSTANE OP) Apply 2 drops to eye daily as needed (dry eyes).    Yes [provider]  potassium chloride SA (K-DUR,KLOR-CON) 20 MEQ tablet Take 20 mEq by mouth daily.   Yes [provider]  timolol (TIMOPTIC) 0.5 % ophthalmic solution Place 1 drop into both eyes every morning.  12/28/13  Yes [provider]  warfarin (COUMADIN) 1 MG tablet TAKE 2.5 OR 3 TABLETS AS DIRECTED BY COUMADIN CLINIC Patient taking differently: TAKE 2.5  TABLETS AS DIRECTED BY COUMADIN CLINIC 08/30/16  Yes Jettie Booze, MD  albuterol (PROVENTIL) (5 MG/ML) 0.5% nebulizer solution Take 0.5 mLs (2.5  mg total) by nebulization every 6 (six) hours as needed for wheezing or shortness of breath. 03/19/16   Marney Setting, NP  metolazone (ZAROXOLYN) 2.5 MG tablet Take 1 tablet (2.5 mg total) by mouth as needed (take if weight goes up 3 lbs in 1 day or 5 lbs in 1 week). Patient taking differently: Take 2.5 mg by mouth as needed (take if weight goes up 3 lbs in 1 day or 5 lbs in 1 week or fluid).  10/27/15   Richardson Dopp T, PA-C  ondansetron (ZOFRAN-ODT) 4 MG disintegrating tablet Take 1 tablet (4 mg total) by mouth every 8 (eight) hours as needed for nausea or vomiting. 09/17/16   Davonna Belling, MD    Physical Exam: Vitals:   12/22/16 1300 12/22/16 1315 12/22/16 1330 12/22/16 1345  BP: (!) 167/78 (!) 171/89 (!) 161/96 (!) 171/98  Pulse: (!) 102 93 99 81  Resp: (!) 22 (!) 23 19 (!) 22  Temp:      TempSrc:      SpO2: 95% 95% 97% 96%  Weight:      Height:          Constitutional: NAD, calm, comfortable Eyes: PERRL, lids and conjunctivae normal ENMT: Mucous membranes are Slightly dry.  Posterior pharynx clear of any exudate or lesions.Normal dentition.  Neck: normal, supple, no masses, no thyromegaly Respiratory: Noted with diffuse crackles posteriorly primarily in the bases. Normal respiratory effort without accessory muscle use. RA Cardiovascular: Irregular rate with underlying atrial fibrillation, no murmurs / rubs / gallops. Trace bilateral lower extremity edema. 2+ pedal pulses. No carotid bruits.  Abdomen: no tenderness, no masses palpated. No hepatosplenomegaly. Bowel sounds positive.  Musculoskeletal: no clubbing / cyanosis. No joint deformity upper and lower extremities. Good ROM, no contractures. Normal muscle tone.  Skin: no rashes, lesions, ulcers. She does have a slightly reddened area in the left medial malleolus it is not indurated or erythematous and appears consistent with early stasis dermatitis Neurologic: CN 2-12 grossly intact. Sensation intact, DTR normal. Strength 5/5 x all 4 extremities.  Psychiatric: Normal judgment and insight. Alert and oriented x 3. Normal mood.    Labs on Admission: I have personally reviewed following labs and imaging studies  CBC:  Recent Labs Lab 12/22/16 1153  WBC 9.4  NEUTROABS 7.8*  HGB 12.2  HCT 39.3  MCV 82.7  PLT 573   Basic Metabolic Panel:  Recent Labs Lab 12/22/16 1153  NA 140  K 3.2*  CL 103  CO2 29  GLUCOSE 116*  BUN 24*  CREATININE 1.56*  CALCIUM 9.4   GFR: Estimated Creatinine Clearance: 18.6 mL/min (A) (by C-G formula based on SCr of 1.56 mg/dL (H)). Liver Function Tests:  Recent Labs Lab 12/22/16 1153  AST 15  ALT 14  ALKPHOS 59  BILITOT 1.0  PROT 6.8  ALBUMIN 3.3*   No results for input(s): LIPASE, AMYLASE in the last 168 hours. No results for input(s): AMMONIA in the last 168 hours. Coagulation Profile:  Recent Labs Lab 12/19/16 1153 12/22/16 1153  INR 4.3 2.62   Cardiac Enzymes:  Recent Labs Lab 12/22/16 1153  TROPONINI 0.03*   BNP (last 3 results) No results  for input(s): PROBNP in the last 8760 hours. HbA1C: No results for input(s): HGBA1C in the last 72 hours. CBG: No results for input(s): GLUCAP in the last 168 hours. Lipid Profile: No results for input(s): CHOL, HDL, LDLCALC, TRIG, CHOLHDL, LDLDIRECT in the last 72 hours. Thyroid Function Tests: No results for  input(s): TSH, T4TOTAL, FREET4, T3FREE, THYROIDAB in the last 72 hours. Anemia Panel: No results for input(s): VITAMINB12, FOLATE, FERRITIN, TIBC, IRON, RETICCTPCT in the last 72 hours. Urine analysis:    Component Value Date/Time   COLORURINE YELLOW 12/22/2016 1150   APPEARANCEUR CLEAR 12/22/2016 1150   LABSPEC 1.012 12/22/2016 1150   PHURINE 5.0 12/22/2016 1150   GLUCOSEU NEGATIVE 12/22/2016 1150   HGBUR NEGATIVE 12/22/2016 1150   BILIRUBINUR NEGATIVE 12/22/2016 1150   KETONESUR NEGATIVE 12/22/2016 1150   PROTEINUR 30 (A) 12/22/2016 1150   UROBILINOGEN 1.0 01/10/2015 2237   NITRITE NEGATIVE 12/22/2016 1150   LEUKOCYTESUR SMALL (A) 12/22/2016 1150   Sepsis Labs: _0 (procalcitonin:4,lacticidven:4) )No results found for this or any previous visit (from the past 240 hour(s)).   Radiological Exams on Admission: Dg Chest 2 View  Result Date: 12/22/2016 CLINICAL DATA:  Generalized weakness and dizziness for 4 days. EXAM: CHEST  2 VIEW COMPARISON:  08/28/2016 and prior radiographs FINDINGS: Cardiomegaly again identified. Left basilar atelectasis/ scarring again noted. There is no evidence of focal airspace disease, pulmonary edema, suspicious pulmonary nodule/mass, pleural effusion, or pneumothorax. No acute bony abnormalities are identified. Lower thoracic/upper lumbar compression fractures and vertebral augmentation changes again noted. IMPRESSION: Cardiomegaly without evidence of acute cardiopulmonary disease. Electronically Signed   By: Margarette Canada M.D.   On: 12/22/2016 10:50   Ct Head Wo Contrast  Result Date: 12/22/2016 CLINICAL DATA:  83 year old female with  generalized weakness and dizziness for 4 days. EXAM: CT HEAD WITHOUT CONTRAST TECHNIQUE: Contiguous axial images were obtained from the base of the skull through the vertex without intravenous contrast. COMPARISON:  04/12/2008 FINDINGS: Brain: No evidence of acute infarction, hemorrhage, hydrocephalus, extra-axial collection or mass lesion/mass effect. Atrophy and mild chronic small-vessel white matter ischemic changes again noted Vascular: Atherosclerotic calcifications again noted. Skull: Normal. Negative for fracture or focal lesion. Sinuses/Orbits: No acute finding. Other: None IMPRESSION: 1. No evidence of acute intracranial abnormality 2. Atrophy and mild chronic small-vessel white matter ischemic changes. Electronically Signed   By: Margarette Canada M.D.   On: 12/22/2016 10:53    EKG: (Independently reviewed) atrial fibrillation with ventricular rate 95 bpm, QTC 469 ms, voltage criteria met for LVH, delayed R-wave rotation, no acute ischemic changes  Assessment/Plan Principal Problem:   Acute on chronic right heart failure and diastolic heart failure/Pulmonary hypertension, moderate to severe w/ tricuspid regurgitation -Patient presents with generalized weakness, dizziness and reports of increased frequency of palpitations with chest x-ray and elevated BNP suggestive of heart failure exacerbation -Lasix 80 mg IV 3 doses then resume home dose Lasix 80 mg oral daily -Hold Zaroxolyn with IV diuresis -Monitor for hypotension with high-dose diuresis-may need to decrease or discontinue antihypertensive medications (see below) -Daily weights, strict I/O -No ACE inhibitor secondary to CKD -Currently not hypoxemic at rest -Continue labetalol -Echocardiogram March 2017: preserved LV systolic function with mild LVH, trivial aortic regurgitation, mild mitral regurgitation, moderate reduction in RV systolic function, moderate tricuspid regurgitation with associated severe pulmonary hypertension 65 mmHg -OP  cardiologist has documented stopped Revatio 2/2 cost and side effects and subsequently transitioned to Imdur  Active Problems:   ? Cellulitis of lower extremity -Skin changes on left ankle appear more consistent with stasis dermatitis    Abnormal urinalysis -Appears consistent with early UTI although patient is asymptomatic but given presentation with generalized weakness and anorexia we'll begin empiric Cipro IV -Urine culture and blood cultures -Patient does have leukocytosis with left shift    CKD (chronic kidney disease),  stage III w/ renal artery stenosis -Renal function stable and at baseline    Chronic a-fib  -Patient has had intermittent tachycardia since arrival -Continue Cardizem -Pharmacy to manage warfarin; recent issues with supratherapeutic INR with appropriate INR between 2-3 at present time -CHADsVASc=5    Acute hypokalemia -Oral replete with 40 mEq twice a day for 3 doses then please resume home dosage potassium -Follow labs    HTN (hypertension) -Currently inadequately controlled -Continue preadmission Cardizem, labetalol and Imdur    Hypothyroidism -Continue Synthroid -TSH    Elevated troponin -Patient without any chest pain and EKG without any ischemic changes -Subtle elevation likely related to underlying CKD as well as acute heart failure and minor demand ischemia -No indication at this juncture to cycle troponins    Dyslipidemia -Continue statin      DVT prophylaxis: Warfarin  Code Status: DO NOT RESUSCITATE  Family Communication: No family at bedside Disposition Plan: Home Consults called: None    ELLIS,ALLISON L. ANP-BC [facility] Pager [phone]   If 7PM-7AM, please contact night-coverage [email or web address] Password [email or web address]  12/22/2016, 1:58 PM

## 2016-12-22 NOTE — ED Notes (Signed)
Phleb consulted to draw blood cultures. Will start IV abx after set drawn.

## 2016-12-22 NOTE — ED Notes (Signed)
Attempted to call report

## 2016-12-22 NOTE — ED Notes (Signed)
EDP at bedside for [place] PIV.

## 2016-12-22 NOTE — ED Provider Notes (Signed)
[facility] DEPT Provider Note   CSN: [id number] Arrival date & time: 12/22/16  1497     History   Chief Complaint Chief Complaint  Patient presents with  . Weakness    HPI Tonya Farley is a 83 y.o. female.   Weakness  Primary symptoms include no focal weakness, no speech change, no memory loss. This is a recurrent problem. The current episode started 1 to 2 hours ago. The problem has not changed since onset.There was no focality noted. There has been no fever. There were no medications administered prior to arrival.    Past Medical History:  Diagnosis Date  . Chronic a-fib ([place])   . Chronic back pain   . Chronic diastolic (congestive) heart failure (HCC)    a. Echo 3/17: mild LVH, EF 55%, no RWMA, mod AS (mean 10 mmHg, peak 21 mmHg), MAC, mild MR, severe LAE, mod reduced RVSF, mild RAE, mod TR, PASP 65 mmHg  . CKD (chronic kidney disease) stage 3, GFR 30-59 ml/min 09/11/2015  . Hyperlipidemia   . Hypertension   . Hypothyroidism   . PNA (pneumonia)   . Pulmonary hypertension (HCC)    PASP 65 mmHg at Echo 3/17  . Thyroid disease     Patient Active Problem List   Diagnosis Date Noted  . Acute on chronic right heart failure ([place]) 12/22/2016  . CKD (chronic kidney disease), stage III w/ renal artery stenosis 12/22/2016  . Cellulitis of lower extremity 12/22/2016  . Chronic a-fib ([place]) 12/22/2016  . Hypothyroidism 12/22/2016  . Acute diastolic heart failure ([place]) 12/22/2016  . Elevated troponin 12/22/2016  . Acute hypokalemia 12/22/2016  . HTN (hypertension) 12/22/2016  . Dyslipidemia 12/22/2016  . Pulmonary hypertension, moderate to severe ([place]) 12/22/2016  . Abnormal urinalysis 12/22/2016  . Hypertensive heart disease 05/08/2016  . Chronic diastolic heart failure ([place]) 11/28/2015  . Anticoagulated 11/28/2015  . CKD (chronic kidney disease) stage 3, GFR 30-59 ml/min 09/11/2015  . Pulmonary hypertension ([place])   . Pleural effusion on left   . Peripheral  edema   . Hypokalemia   . Anasarca 08/10/2015  . Acute on chronic respiratory failure with hypoxia ([place])   . Pleural effusion   . Physical deconditioning   . Cough   . HCAP (healthcare-associated pneumonia)   . Chest congestion   . SOB (shortness of breath)   . Swelling   . Acute on chronic diastolic heart failure ([place])   . HLD (hyperlipidemia)   . Influenza due to identified novel influenza A virus with other respiratory manifestations 06/07/2015  . Pleural effusion, bacterial   . Dyspnea   . AKI (acute kidney injury) ([place])   . CAP (community acquired pneumonia) 05/30/2015  . Renal artery stenosis ([place]) 08/08/2014  . Encounter for therapeutic drug monitoring 04/21/2013  . Hypertension 06/25/2012  . Hyperlipidemia 06/25/2012  . Atrial fibrillation ([place]) 06/25/2012  . Compression fracture of lumbar spine, non-traumatic ([place]) 06/25/2012  . Chronic back pain     Past Surgical History:  Procedure Laterality Date  . APPENDECTOMY    . cataracts     bilateral  . EYE SURGERY     left eye "hole"  . TONSILLECTOMY      OB History    No data available       Home Medications    Prior to Admission medications   Medication Sig Start Date End Date Taking? Authorizing Provider  atorvastatin (LIPITOR) 10 MG tablet Take 10 mg by mouth daily.   Yes [provider]  B Complex Vitamins (B COMPLEX 1 PO) Take 1 tablet by mouth daily.   Yes [provider]  brimonidine (ALPHAGAN) 0.15 % ophthalmic solution Place 1 drop into both eyes 3 (three) times daily.   Yes [provider]  cholecalciferol (VITAMIN D) 400 units TABS tablet Take 400 Units by mouth daily.   Yes [provider]  diltiazem (CARDIZEM CD) 360 MG 24 hr capsule Take 1 capsule (360 mg total) by mouth daily. 04/03/16  Yes Jettie Booze, MD  furosemide (LASIX) 80 MG tablet Take 1 tablet (80 mg total) by mouth daily. 10/29/16  Yes Jettie Booze, MD  isosorbide mononitrate (IMDUR) 30  MG 24 hr tablet Take 1 tablet (30 mg total) by mouth daily. 05/03/16 12/22/16 Yes Jettie Booze, MD  labetalol (NORMODYNE) 200 MG tablet TAKE 1 TABLET(200 MG) BY MOUTH TWICE DAILY 07/02/16  Yes Jettie Booze, MD  latanoprost (XALATAN) 0.005 % ophthalmic solution Place 1 drop into both eyes at bedtime. 01/10/14  Yes [provider]  levothyroxine (SYNTHROID, LEVOTHROID) 88 MCG tablet Take 88 mcg by mouth daily before breakfast.   Yes [provider]  pentoxifylline (TRENTAL) 400 MG CR tablet Take 400 mg by mouth 3 (three) times daily with meals.   Yes [provider]  Polyethyl Glycol-Propyl Glycol (SYSTANE OP) Apply 2 drops to eye daily as needed (dry eyes).    Yes [provider]  potassium chloride SA (K-DUR,KLOR-CON) 20 MEQ tablet Take 20 mEq by mouth daily.   Yes [provider]  timolol (TIMOPTIC) 0.5 % ophthalmic solution Place 1 drop into both eyes every morning.  12/28/13  Yes [provider]  warfarin (COUMADIN) 1 MG tablet TAKE 2.5 OR 3 TABLETS AS DIRECTED BY COUMADIN CLINIC Patient taking differently: TAKE 2.5  TABLETS AS DIRECTED BY COUMADIN CLINIC 08/30/16  Yes Jettie Booze, MD  albuterol (PROVENTIL) (5 MG/ML) 0.5% nebulizer solution Take 0.5 mLs (2.5 mg total) by nebulization every 6 (six) hours as needed for wheezing or shortness of breath. 03/19/16   Marney Setting, NP  metolazone (ZAROXOLYN) 2.5 MG tablet Take 1 tablet (2.5 mg total) by mouth as needed (take if weight goes up 3 lbs in 1 day or 5 lbs in 1 week). Patient taking differently: Take 2.5 mg by mouth as needed (take if weight goes up 3 lbs in 1 day or 5 lbs in 1 week or fluid).  10/27/15   Richardson Dopp T, PA-C  ondansetron (ZOFRAN-ODT) 4 MG disintegrating tablet Take 1 tablet (4 mg total) by mouth every 8 (eight) hours as needed for nausea or vomiting. 09/17/16   Davonna Belling, MD    Family History Family History  Problem Relation Age of Onset  .  Heart disease Sister   . Heart disease Sister   . Cancer Mother   . Other Father   . Heart attack Father     Social History Social History  Substance Use Topics  . Smoking status: Never Smoker  . Smokeless tobacco: Never Used  . Alcohol use No     Allergies   Penicillins; Sulfa antibiotics; and Fish allergy   Review of Systems Review of Systems  Neurological: Positive for weakness. Negative for speech change and focal weakness.  Psychiatric/Behavioral: Negative for memory loss.  All other systems reviewed and are negative.    Physical Exam Updated Vital Signs BP (!) 161/99   Pulse (!) 108   Temp 98.1 F (36.7 C) (Oral)  Resp (!) 23   Ht _0  (1.626 m)   Wt 61.7 kg (136 lb)   SpO2 94%   BMI 23.34 kg/m   Physical Exam  Constitutional: She is oriented to person, place, and time. She appears well-developed and well-nourished.  HENT:  Head: Normocephalic and atraumatic.  Mouth/Throat: Mucous membranes are dry.  Eyes: Conjunctivae and EOM are normal.  Neck: Normal range of motion.  Cardiovascular: An irregularly irregular rhythm present. Tachycardia present.   Pulmonary/Chest: Effort normal and breath sounds normal. No stridor. No respiratory distress.  Abdominal: Soft. She exhibits no distension.  Musculoskeletal: Normal range of motion. She exhibits no edema or deformity.  Neurological: She is alert and oriented to person, place, and time. No cranial nerve deficit.  Skin: Skin is warm and dry. There is erythema (left medial foot with warmth).  Nursing note and vitals reviewed.    ED Treatments / Results  Labs (all labs ordered are listed, but only abnormal results are displayed) Labs Reviewed  CBC WITH DIFFERENTIAL/PLATELET - Abnormal; Notable for the following:       Result Value   MCH 25.7 (*)    RDW 17.3 (*)    Neutro Abs 7.8 (*)    Lymphs Abs 0.6 (*)    All other components within normal limits  COMPREHENSIVE METABOLIC PANEL - Abnormal; Notable  for the following:    Potassium 3.2 (*)    Glucose, Bld 116 (*)    BUN 24 (*)    Creatinine, Ser 1.56 (*)    Albumin 3.3 (*)    GFR calc non Af Amer 27 (*)    GFR calc Af Amer 31 (*)    All other components within normal limits  BRAIN NATRIURETIC PEPTIDE - Abnormal; Notable for the following:    B Natriuretic Peptide 851.0 (*)    All other components within normal limits  TROPONIN I - Abnormal; Notable for the following:    Troponin I 0.03 (*)    All other components within normal limits  PROTIME-INR - Abnormal; Notable for the following:    Prothrombin Time 27.8 (*)    All other components within normal limits  URINALYSIS, ROUTINE W REFLEX MICROSCOPIC - Abnormal; Notable for the following:    Protein, ur 30 (*)    Leukocytes, UA SMALL (*)    Squamous Epithelial / LPF 0-5 (*)    All other components within normal limits  CULTURE, BLOOD (ROUTINE X 2)  CULTURE, BLOOD (ROUTINE X 2)  URINE CULTURE  TSH    EKG  EKG Interpretation  Date/Time:  Sunday December 22 2016 09:14:19 EDT Ventricular Rate:  95 PR Interval:    QRS Duration: 121 QT Interval:  373 QTC Calculation: 469 R Axis:   -68 Text Interpretation:  Atrial fibrillation Ventricular premature complex Nonspecific IVCD with LAD Left ventricular hypertrophy Baseline wander in lead(s) V6 No significant change since last tracing june 26 Confirmed by Merrily Pew [phone]) on 12/22/2016 9:25:04 AM Also confirmed by Merrily Pew [phone]), editor Drema Pry [phone])  on 12/22/2016 10:13:28 AM       Radiology Dg Chest 2 View  Result Date: 12/22/2016 CLINICAL DATA:  Generalized weakness and dizziness for 4 days. EXAM: CHEST  2 VIEW COMPARISON:  08/28/2016 and prior radiographs FINDINGS: Cardiomegaly again identified. Left basilar atelectasis/ scarring again noted. There is no evidence of focal airspace disease, pulmonary edema, suspicious pulmonary nodule/mass, pleural effusion, or pneumothorax. No acute bony  abnormalities are identified. Lower thoracic/upper lumbar compression fractures and  vertebral augmentation changes again noted. IMPRESSION: Cardiomegaly without evidence of acute cardiopulmonary disease. Electronically Signed   By: Margarette Canada M.D.   On: 12/22/2016 10:50   Ct Head Wo Contrast  Result Date: 12/22/2016 CLINICAL DATA:  83 year old female with generalized weakness and dizziness for 4 days. EXAM: CT HEAD WITHOUT CONTRAST TECHNIQUE: Contiguous axial images were obtained from the base of the skull through the vertex without intravenous contrast. COMPARISON:  04/12/2008 FINDINGS: Brain: No evidence of acute infarction, hemorrhage, hydrocephalus, extra-axial collection or mass lesion/mass effect. Atrophy and mild chronic small-vessel white matter ischemic changes again noted Vascular: Atherosclerotic calcifications again noted. Skull: Normal. Negative for fracture or focal lesion. Sinuses/Orbits: No acute finding. Other: None IMPRESSION: 1. No evidence of acute intracranial abnormality 2. Atrophy and mild chronic small-vessel white matter ischemic changes. Electronically Signed   By: Margarette Canada M.D.   On: 12/22/2016 10:53    Procedures Procedures (including critical care time)  Medications Ordered in ED Medications  furosemide (LASIX) injection 80 mg (not administered)    Followed by  furosemide (LASIX) tablet 80 mg (not administered)  potassium chloride SA (K-DUR,KLOR-CON) CR tablet 40 mEq (not administered)  ciprofloxacin (CIPRO) IVPB 400 mg (not administered)  warfarin (COUMADIN) tablet 2.5 mg (not administered)  Warfarin - Pharmacist Dosing Inpatient (not administered)  sodium chloride 0.9 % bolus 1,000 mL (1,000 mLs Intravenous New Bag/Given 12/22/16 1133)  furosemide (LASIX) injection 40 mg (40 mg Intravenous Given 12/22/16 1425)     Initial Impression / Assessment and Plan / ED Course  I have reviewed the triage vital signs and the nursing notes.  Pertinent labs & imaging  results that were available during my care of the patient were reviewed by me and considered in my medical decision making (see chart for details).  Generalized weakness. Possible dehydration? UTI? Will eval for same. Fluids for concern of dehydration.   Workup overall unremarkable aside from worsening heart failure. I'm sure this was not made better by the fluids that I gave her however she did seem to be clinically dehydrated so I suspect she is third spacing most of her fluids. A dose of Lasix was given, and a dose of clindamycin given for concern of cellulitis or left lower extremity and will admit for observation to medicine.   Final Clinical Impressions(s) / ED Diagnoses   Final diagnoses:  Weakness     Elara Cocke, Corene Cornea, MD 12/22/16 [phone]

## 2016-12-22 NOTE — Progress Notes (Signed)
Recent blood pressure shows marked decreased to 96/64 after Lasix given therefore will hold preadmission Imdur and Normodyne in change Cardizem to shorter acting form with hold parameters.  Junious Silk, ANP

## 2016-12-23 DIAGNOSIS — Z9841 Cataract extraction status, right eye: Secondary | ICD-10-CM | POA: Diagnosis not present

## 2016-12-23 DIAGNOSIS — I701 Atherosclerosis of renal artery: Secondary | ICD-10-CM | POA: Diagnosis present

## 2016-12-23 DIAGNOSIS — R8290 Unspecified abnormal findings in urine: Secondary | ICD-10-CM

## 2016-12-23 DIAGNOSIS — Z66 Do not resuscitate: Secondary | ICD-10-CM | POA: Diagnosis present

## 2016-12-23 DIAGNOSIS — Z91018 Allergy to other foods: Secondary | ICD-10-CM | POA: Diagnosis not present

## 2016-12-23 DIAGNOSIS — E876 Hypokalemia: Secondary | ICD-10-CM | POA: Diagnosis not present

## 2016-12-23 DIAGNOSIS — W19XXXA Unspecified fall, initial encounter: Secondary | ICD-10-CM | POA: Diagnosis present

## 2016-12-23 DIAGNOSIS — E860 Dehydration: Secondary | ICD-10-CM | POA: Diagnosis present

## 2016-12-23 DIAGNOSIS — Z7901 Long term (current) use of anticoagulants: Secondary | ICD-10-CM | POA: Diagnosis not present

## 2016-12-23 DIAGNOSIS — L03119 Cellulitis of unspecified part of limb: Secondary | ICD-10-CM | POA: Diagnosis present

## 2016-12-23 DIAGNOSIS — I482 Chronic atrial fibrillation: Secondary | ICD-10-CM | POA: Diagnosis not present

## 2016-12-23 DIAGNOSIS — Z8249 Family history of ischemic heart disease and other diseases of the circulatory system: Secondary | ICD-10-CM | POA: Diagnosis not present

## 2016-12-23 DIAGNOSIS — I272 Pulmonary hypertension, unspecified: Secondary | ICD-10-CM | POA: Diagnosis not present

## 2016-12-23 DIAGNOSIS — E785 Hyperlipidemia, unspecified: Secondary | ICD-10-CM | POA: Diagnosis present

## 2016-12-23 DIAGNOSIS — I50813 Acute on chronic right heart failure: Secondary | ICD-10-CM | POA: Diagnosis not present

## 2016-12-23 DIAGNOSIS — I361 Nonrheumatic tricuspid (valve) insufficiency: Secondary | ICD-10-CM | POA: Diagnosis not present

## 2016-12-23 DIAGNOSIS — N183 Chronic kidney disease, stage 3 (moderate): Secondary | ICD-10-CM

## 2016-12-23 DIAGNOSIS — Z9842 Cataract extraction status, left eye: Secondary | ICD-10-CM | POA: Diagnosis not present

## 2016-12-23 DIAGNOSIS — R748 Abnormal levels of other serum enzymes: Secondary | ICD-10-CM | POA: Diagnosis not present

## 2016-12-23 DIAGNOSIS — D638 Anemia in other chronic diseases classified elsewhere: Secondary | ICD-10-CM | POA: Diagnosis present

## 2016-12-23 DIAGNOSIS — R0602 Shortness of breath: Secondary | ICD-10-CM | POA: Diagnosis not present

## 2016-12-23 DIAGNOSIS — N390 Urinary tract infection, site not specified: Secondary | ICD-10-CM | POA: Diagnosis present

## 2016-12-23 DIAGNOSIS — E039 Hypothyroidism, unspecified: Secondary | ICD-10-CM | POA: Diagnosis present

## 2016-12-23 DIAGNOSIS — I130 Hypertensive heart and chronic kidney disease with heart failure and stage 1 through stage 4 chronic kidney disease, or unspecified chronic kidney disease: Secondary | ICD-10-CM | POA: Diagnosis present

## 2016-12-23 DIAGNOSIS — I10 Essential (primary) hypertension: Secondary | ICD-10-CM | POA: Diagnosis not present

## 2016-12-23 DIAGNOSIS — R7989 Other specified abnormal findings of blood chemistry: Secondary | ICD-10-CM | POA: Diagnosis present

## 2016-12-23 DIAGNOSIS — I082 Rheumatic disorders of both aortic and tricuspid valves: Secondary | ICD-10-CM | POA: Diagnosis present

## 2016-12-23 DIAGNOSIS — I5033 Acute on chronic diastolic (congestive) heart failure: Secondary | ICD-10-CM | POA: Diagnosis present

## 2016-12-23 DIAGNOSIS — Z880 Allergy status to penicillin: Secondary | ICD-10-CM | POA: Diagnosis not present

## 2016-12-23 DIAGNOSIS — R531 Weakness: Secondary | ICD-10-CM | POA: Diagnosis present

## 2016-12-23 DIAGNOSIS — I5022 Chronic systolic (congestive) heart failure: Secondary | ICD-10-CM | POA: Diagnosis not present

## 2016-12-23 DIAGNOSIS — Z882 Allergy status to sulfonamides status: Secondary | ICD-10-CM | POA: Diagnosis not present

## 2016-12-23 DIAGNOSIS — J9811 Atelectasis: Secondary | ICD-10-CM | POA: Diagnosis present

## 2016-12-23 LAB — URINE CULTURE: Culture: NO GROWTH

## 2016-12-23 LAB — BASIC METABOLIC PANEL
ANION GAP: 11 (ref 5–15)
BUN: 21 mg/dL — AB (ref 6–20)
CO2: 28 mmol/L (ref 22–32)
Calcium: 9.4 mg/dL (ref 8.9–10.3)
Chloride: 101 mmol/L (ref 101–111)
Creatinine, Ser: 1.37 mg/dL — ABNORMAL HIGH (ref 0.44–1.00)
GFR calc Af Amer: 37 mL/min — ABNORMAL LOW (ref >60)
GFR, EST NON AFRICAN AMERICAN: 32 mL/min — AB (ref >60)
Glucose, Bld: 115 mg/dL — ABNORMAL HIGH (ref 65–99)
Potassium: 3.9 mmol/L (ref 3.5–5.1)
SODIUM: 140 mmol/L (ref 135–145)

## 2016-12-23 LAB — PROTIME-INR
INR: 2.48
PROTHROMBIN TIME: 26.6 s — AB (ref 11.4–15.2)

## 2016-12-23 LAB — TROPONIN I: TROPONIN I: 0.03 ng/mL — AB (ref <0.03)

## 2016-12-23 MED ORDER — DILTIAZEM HCL ER COATED BEADS 180 MG PO CP24
360.0000 mg | ORAL_CAPSULE | Freq: Every day | ORAL | Status: DC
Start: 2016-12-23 — End: 2016-12-26
  Administered 2016-12-23 – 2016-12-26 (×4): 360 mg via ORAL
  Filled 2016-12-23 (×4): qty 2

## 2016-12-23 MED ORDER — METOPROLOL TARTRATE 5 MG/5ML IV SOLN: 5.0000 mg | Freq: Four times a day (QID) | INTRAVENOUS | Status: DC | PRN

## 2016-12-23 MED ORDER — WARFARIN SODIUM 2 MG PO TABS
2.0000 mg | ORAL_TABLET | Freq: Once | ORAL | Status: AC
  Administered 2016-12-23: 2 mg via ORAL
  Filled 2016-12-23: qty 1

## 2016-12-23 MED ORDER — ISOSORBIDE MONONITRATE ER 30 MG PO TB24
30.0000 mg | ORAL_TABLET | Freq: Every day | ORAL | Status: DC
  Administered 2016-12-23 – 2016-12-27 (×5): 30 mg via ORAL
  Filled 2016-12-23 (×5): qty 1

## 2016-12-23 MED ORDER — LABETALOL HCL 200 MG PO TABS
200.0000 mg | ORAL_TABLET | Freq: Two times a day (BID) | ORAL | Status: DC
  Administered 2016-12-23 – 2016-12-26 (×7): 200 mg via ORAL
  Filled 2016-12-23 (×7): qty 1

## 2016-12-23 MED ORDER — CIPROFLOXACIN HCL 500 MG PO TABS
500.0000 mg | ORAL_TABLET | Freq: Every day | ORAL | Status: DC
  Administered 2016-12-23: 500 mg via ORAL
  Filled 2016-12-23: qty 1

## 2016-12-23 MED ORDER — POTASSIUM CHLORIDE 20 MEQ PO PACK
40.0000 meq | PACK | Freq: Two times a day (BID) | ORAL | Status: DC
  Administered 2016-12-23 – 2016-12-24 (×3): 40 meq via ORAL
  Filled 2016-12-23 (×3): qty 2

## 2016-12-23 NOTE — Progress Notes (Signed)
ANTICOAGULATION CONSULT NOTE - Follow-up Consult  Pharmacy Consult for Warfarin Indication: atrial fibrillation  Allergies  Allergen Reactions  . Penicillins Hives and Swelling    Has patient had a PCN reaction causing immediate rash, facial/tongue/throat swelling, SOB or lightheadedness with hypotension: Yes Has patient had a PCN reaction causing severe rash involving mucus membranes or skin necrosis: Yes Has patient had a PCN reaction that required hospitalization No Has patient had a PCN reaction occurring within the last 10 years: No If all of the above answers are "NO", then may proceed with Cephalosporin use.    . Sulfa Antibiotics Anaphylaxis and Swelling  . Fish Allergy Nausea And Vomiting   Patient Measurements: Height: _0  (162.6 cm) Weight: 131 lb 1.6 oz (59.5 kg) IBW/kg (Calculated) : 54.7  Vital Signs: Temp: 98.7 F (37.1 C) (10/01 0800) Temp Source: Oral (10/01 0800) BP: 148/97 (10/01 0800) Pulse Rate: 130 (10/01 0800)  Labs:  Recent Labs  12/22/16 1153 12/23/16 0534  HGB 12.2  --   HCT 39.3  --   PLT 214  --   LABPROT 27.8* 26.6*  INR 2.62 2.48  CREATININE 1.56* 1.37*  TROPONINI 0.03*  --    Estimated Creatinine Clearance: 21.2 mL/min (A) (by C-G formula based on SCr of 1.37 mg/dL (H)).  Medical History: Past Medical History:  Diagnosis Date  . Chronic a-fib ([place])   . Chronic back pain   . Chronic diastolic (congestive) heart failure (HCC)    a. Echo 3/17: mild LVH, EF 55%, no RWMA, mod AS (mean 10 mmHg, peak 21 mmHg), MAC, mild MR, severe LAE, mod reduced RVSF, mild RAE, mod TR, PASP 65 mmHg  . CKD (chronic kidney disease) stage 3, GFR 30-59 ml/min 09/11/2015  . Hyperlipidemia   . Hypertension   . Hypothyroidism   . PNA (pneumonia)   . Pulmonary hypertension (HCC)    PASP 65 mmHg at Echo 3/17  . Thyroid disease    Medications:  Home Warfarin regimen: 2m on Wednesdays and 2.539mall other days  *This dose was just lowered on 9/27 from  8m36mhree times a week and 2.5mg72ml other due to an INR of 4.3 (which had come down after an INR 6.22 the week prior for which warfarin was held for 2 days then resuming same regimen [facility]).   Assessment: 89 y78r old female on chronic warfarin for atrial fibrillation. Admit INR 2.62 - therapeutic on current home regimen.   INR therapeutic 2.48 - No overt bleeding documented  Note drug interaction with Ciprofloxacin added for UTI.  Goal of Therapy:  INR 2-3 Monitor platelets by anticoagulation protocol: Yes   Plan:  Warfarin 2mg 1mx1 today Daily INR given drug-drug interaction Monitor for s/sx of bleeding  Rasheed Welty Georga BorarmD Clinical Pharmacist 12/23/2016 10:31 AM

## 2016-12-23 NOTE — Progress Notes (Signed)
PROGRESS NOTE    Tonya Farley  ZOX:[id number] DOB: 1920/03/05 DOA: 12/22/2016 PCP: Burton Apley, MD   Outpatient Specialists:    Brief Narrative:  83 year old fairly active and independent female with chronic right heart failure and severe pulmonary hypertension, chronic A. fib, status post 3 kidney disease and hypertension presented with generalized weakness for past 2 weeks which has been progressive. Also noticed increased lower leg swelling. She fell on Saturday in her feet today.   Assessment & Plan:   Principal Problem:   Acute on chronic right heart failure (HCC) Active Problems:   CKD (chronic kidney disease), stage III w/ renal artery stenosis   Cellulitis of lower extremity   Chronic a-fib (HCC)   Hypothyroidism   Acute diastolic heart failure (HCC)   Elevated troponin   Acute hypokalemia   HTN (hypertension)   Dyslipidemia   Pulmonary hypertension, moderate to severe (HCC)   Abnormal urinalysis   Acute on chronic diastolic CHF Also has severe pulmonary hypertension and chronic right heart failure. IV Lasix 80 mg every 12 hours x 3 doses then resume home dose - strict I/when daily weight.  - Last 2-D echo from 05/2015. Would not repeat echo unless symptoms worsened or not responding to diuresis.  Elevated troponin Mild, suspect secondary to fluid overload, A. fib. No chest pain symptoms Admitted MD did not order more troponins  Hypokalemia Replenish  ? UTI Empiric Cipro. Await culture  A. fib Resume home meds -PRN IV BB -may need change of medications   DVT prophylaxis:  Fully anticoagulated   Code Status: DNR   Family Communication:   Disposition Plan:     Consultants:      Subjective: C/o nausea  Objective: Vitals:   12/22/16 2045 12/22/16 2340 12/23/16 0522 12/23/16 0800  BP: (!) 164/73 (!) 143/79 (!) 149/83 (!) 148/97  Pulse: 81 67 81 (!) 130  Resp: (!) 24  Temp: 98.4 F (36.9 C) 97.6 F (36.4 C)  97.8 F (36.6 C) 98.7 F (37.1 C)  TempSrc: Oral Oral Oral Oral  SpO2: 98% 97% 97% 96%  Weight:   59.5 kg (131 lb 1.6 oz)   Height:        Intake/Output Summary (Last 24 hours) at 12/23/16 1233 Last data filed at 12/23/16 1100  Gross per 24 hour  Intake             1800 ml  Output             3550 ml  Net            -1750 ml   Filed Weights   12/22/16 0905 12/22/16 1629 12/23/16 0522  Weight: 61.7 kg (136 lb) 61.6 kg (135 lb 12.8 oz) 59.5 kg (131 lb 1.6 oz)    Examination:  General exam: uncomfortable appearing Respiratory system: no wheezing Cardiovascular system: irr- fast Gastrointestinal system: +Bs, soft Central nervous system: Alert  Extremities: moves all 4 ext Skin: No rashes, lesions or ulcers Psychiatry: Judgement and insight appear normal. Mood & affect appropriate.     Data Reviewed: I have personally reviewed following labs and imaging studies  CBC:  Recent Labs Lab 12/22/16 1153  WBC 9.4  NEUTROABS 7.8*  HGB 12.2  HCT 39.3  MCV 82.7  PLT 214   Basic Metabolic Panel:  Recent Labs Lab 12/22/16 1153 12/23/16 0534  NA 140 140  K 3.2* 3.9  CL 103 101  CO2 29 28  GLUCOSE 116* 115*  BUN  24* 21*  CREATININE 1.56* 1.37*  CALCIUM 9.4 9.4   GFR: Estimated Creatinine Clearance: 21.2 mL/min (A) (by C-G formula based on SCr of 1.37 mg/dL (H)). Liver Function Tests:  Recent Labs Lab 12/22/16 1153  AST 15  ALT 14  ALKPHOS 59  BILITOT 1.0  PROT 6.8  ALBUMIN 3.3*   No results for input(s): LIPASE, AMYLASE in the last 168 hours. No results for input(s): AMMONIA in the last 168 hours. Coagulation Profile:  Recent Labs Lab 12/19/16 1153 12/22/16 1153 12/23/16 0534  INR 4.3 2.62 2.48   Cardiac Enzymes:  Recent Labs Lab 12/22/16 1153  TROPONINI 0.03*   BNP (last 3 results) No results for input(s): PROBNP in the last 8760 hours. HbA1C: No results for input(s): HGBA1C in the last 72 hours. CBG: No results for input(s): GLUCAP in  the last 168 hours. Lipid Profile: No results for input(s): CHOL, HDL, LDLCALC, TRIG, CHOLHDL, LDLDIRECT in the last 72 hours. Thyroid Function Tests:  Recent Labs  12/22/16 1754  TSH 1.438   Anemia Panel: No results for input(s): VITAMINB12, FOLATE, FERRITIN, TIBC, IRON, RETICCTPCT in the last 72 hours. Urine analysis:    Component Value Date/Time   COLORURINE YELLOW 12/22/2016 1150   APPEARANCEUR CLEAR 12/22/2016 1150   LABSPEC 1.012 12/22/2016 1150   PHURINE 5.0 12/22/2016 1150   GLUCOSEU NEGATIVE 12/22/2016 1150   HGBUR NEGATIVE 12/22/2016 1150   BILIRUBINUR NEGATIVE 12/22/2016 1150   KETONESUR NEGATIVE 12/22/2016 1150   PROTEINUR 30 (A) 12/22/2016 1150   UROBILINOGEN 1.0 01/10/2015 2237   NITRITE NEGATIVE 12/22/2016 1150   LEUKOCYTESUR SMALL (A) 12/22/2016 1150     ) Recent Results (from the past 240 hour(s))  Culture, blood (Routine X 2) w Reflex to ID Panel     Status: None (Preliminary result)   Collection Time: 12/22/16  1:55 PM  Result Value Ref Range Status   Specimen Description BLOOD RIGHT HAND  Final   Special Requests   Final    BOTTLES DRAWN AEROBIC ONLY Blood Culture adequate volume   Culture NO GROWTH < 24 HOURS  Final   Report Status PENDING  Incomplete  Culture, blood (Routine X 2) w Reflex to ID Panel     Status: None (Preliminary result)   Collection Time: 12/22/16  2:04 PM  Result Value Ref Range Status   Specimen Description BLOOD LEFT HAND  Final   Special Requests   Final    BOTTLES DRAWN AEROBIC ONLY Blood Culture adequate volume   Culture NO GROWTH < 24 HOURS  Final   Report Status PENDING  Incomplete      Anti-infectives    Start     Dose/Rate Route Frequency Ordered Stop   12/23/16 1800  ciprofloxacin (CIPRO) tablet 500 mg     500 mg Oral Daily-1800 12/23/16 1035     12/22/16 1500  ciprofloxacin (CIPRO) IVPB 400 mg  Status:  Discontinued     400 mg 200 mL/hr over 60 Minutes Intravenous Every 24 hours 12/22/16 1354 12/23/16 1035    12/22/16 1400  clindamycin (CLEOCIN) IVPB 300 mg  Status:  Discontinued     300 mg 100 mL/hr over 30 Minutes Intravenous  Once 12/22/16 1306 12/22/16 1354       Radiology Studies: Dg Chest 2 View  Result Date: 12/22/2016 CLINICAL DATA:  Generalized weakness and dizziness for 4 days. EXAM: CHEST  2 VIEW COMPARISON:  08/28/2016 and prior radiographs FINDINGS: Cardiomegaly again identified. Left basilar atelectasis/ scarring again noted. There is  no evidence of focal airspace disease, pulmonary edema, suspicious pulmonary nodule/mass, pleural effusion, or pneumothorax. No acute bony abnormalities are identified. Lower thoracic/upper lumbar compression fractures and vertebral augmentation changes again noted. IMPRESSION: Cardiomegaly without evidence of acute cardiopulmonary disease. Electronically Signed   By: Harmon Pier M.D.   On: 12/22/2016 10:50   Ct Head Wo Contrast  Result Date: 12/22/2016 CLINICAL DATA:  83 year old female with generalized weakness and dizziness for 4 days. EXAM: CT HEAD WITHOUT CONTRAST TECHNIQUE: Contiguous axial images were obtained from the base of the skull through the vertex without intravenous contrast. COMPARISON:  04/12/2008 FINDINGS: Brain: No evidence of acute infarction, hemorrhage, hydrocephalus, extra-axial collection or mass lesion/mass effect. Atrophy and mild chronic small-vessel white matter ischemic changes again noted Vascular: Atherosclerotic calcifications again noted. Skull: Normal. Negative for fracture or focal lesion. Sinuses/Orbits: No acute finding. Other: None IMPRESSION: 1. No evidence of acute intracranial abnormality 2. Atrophy and mild chronic small-vessel white matter ischemic changes. Electronically Signed   By: Harmon Pier M.D.   On: 12/22/2016 10:53        Scheduled Meds: . atorvastatin  10 mg Oral Q2000  . brimonidine  1 drop Both Eyes TID  . ciprofloxacin  500 mg Oral q1800  . diltiazem  360 mg Oral Daily  . furosemide  80 mg  Intravenous BID   Followed by  . [START ON 12/24/2016] furosemide  80 mg Oral Daily  . isosorbide mononitrate  30 mg Oral Daily  . labetalol  200 mg Oral BID  . latanoprost  1 drop Both Eyes QHS  . levothyroxine  88 mcg Oral QAC breakfast  . pentoxifylline  400 mg Oral TID WC  . potassium chloride  40 mEq Oral BID  . sodium chloride flush  3 mL Intravenous Q12H  . timolol  1 drop Both Eyes q morning - 10a  . warfarin  2 mg Oral ONCE-1800  . Warfarin - Pharmacist Dosing Inpatient   Does not apply q1800   Continuous Infusions: . sodium chloride       LOS: 0 days    Time spent: 35 min    JESSICA U VANN, DO [facility] Pager [phone]  If 7PM-7AM, please contact night-coverage [email or web address] Password [email or web address] 12/23/2016, 12:33 PM

## 2016-12-23 NOTE — Care Management Note (Signed)
Case Management Note  Patient Details  Name: Tonya Farley MRN: [id number] Date of Birth: [birth year]  Subjective/Objective:  CHF                 Action/Plan: Patient lives at home alone, continue to drive her car short distances, grocery shops; PCP: Burton Apley, MD; has private insurance with Medicare; pharmacy of choice is [facility]; patient states that she has a friend that stays with her one day a week; DME - walker, cane, blood pressure machine and rollater, scales - she know to weigh herself daily and cooks a diet low in sodium; she also has a stationary bike that she use daily; No needs identified at this time. CM will continue to follow for DCP.  Expected Discharge Date:    possibly 12/25/2016              Expected Discharge Plan:  Home/Self Care  Discharge planning Services  CM Consult   Status of Service:  In process, will continue to follow  Reola Mosher [phone] 12/23/2016, 11:41 AM

## 2016-12-23 NOTE — Progress Notes (Signed)
Paged MD to let her know that pt "doesn't feel well". PRN zofran given. Will monitor.

## 2016-12-24 DIAGNOSIS — I10 Essential (primary) hypertension: Secondary | ICD-10-CM

## 2016-12-24 LAB — BASIC METABOLIC PANEL
Anion gap: 8 (ref 5–15)
BUN: 29 mg/dL — ABNORMAL HIGH (ref 6–20)
CHLORIDE: 103 mmol/L (ref 101–111)
CO2: 27 mmol/L (ref 22–32)
CREATININE: 1.87 mg/dL — AB (ref 0.44–1.00)
Calcium: 9.3 mg/dL (ref 8.9–10.3)
GFR calc non Af Amer: 22 mL/min — ABNORMAL LOW (ref >60)
GFR, EST AFRICAN AMERICAN: 25 mL/min — AB (ref >60)
Glucose, Bld: 116 mg/dL — ABNORMAL HIGH (ref 65–99)
Potassium: 5.2 mmol/L — ABNORMAL HIGH (ref 3.5–5.1)
Sodium: 138 mmol/L (ref 135–145)

## 2016-12-24 LAB — CBC
HEMATOCRIT: 32.6 % — AB (ref 36.0–46.0)
HEMOGLOBIN: 10.1 g/dL — AB (ref 12.0–15.0)
MCH: 25.8 pg — ABNORMAL LOW (ref 26.0–34.0)
MCHC: 31 g/dL (ref 30.0–36.0)
MCV: 83.4 fL (ref 78.0–100.0)
Platelets: 247 10*3/uL (ref 150–400)
RBC: 3.91 MIL/uL (ref 3.87–5.11)
RDW: 17.9 % — ABNORMAL HIGH (ref 11.5–15.5)
WBC: 13.4 10*3/uL — ABNORMAL HIGH (ref 4.0–10.5)

## 2016-12-24 LAB — PROTIME-INR
INR: 3.47
Prothrombin Time: 34.7 seconds — ABNORMAL HIGH (ref 11.4–15.2)

## 2016-12-24 NOTE — Progress Notes (Signed)
PROGRESS NOTE    Tonya Farley  ZOX:[id number] DOB: December 18, 1919 DOA: 12/22/2016 PCP: Burton Apley, MD   Outpatient Specialists:    Brief Narrative:  83 year old fairly active and independent female with chronic right heart failure and severe pulmonary hypertension, chronic A. fib, status post 3 kidney disease and hypertension presented with generalized weakness for past 2 weeks which has been progressive. Also noticed increased lower leg swelling. She fell on Saturday in her feet today.   Assessment & Plan:   Principal Problem:   Acute on chronic right heart failure (HCC) Active Problems:   CKD (chronic kidney disease), stage III w/ renal artery stenosis   Cellulitis of lower extremity   Chronic a-fib (HCC)   Hypothyroidism   Acute diastolic heart failure (HCC)   Elevated troponin   Acute hypokalemia   HTN (hypertension)   Dyslipidemia   Pulmonary hypertension, moderate to severe (HCC)   Abnormal urinalysis   Acute on chronic diastolic CHF Also has severe pulmonary hypertension and chronic right heart failure. IV Lasix 80 mg every 12 hours x 3 doses then resume home dose - strict I/when daily weight- down 2.2 L per I/Os - Last 2-D echo from 05/2015. Will not repeat echo unless symptoms worsened or not responding to diuresis.  CKD- stage 3 -monitor Cr while in hospital  Elevated troponin Mild, suspect secondary to fluid overload, A. fib. No chest pain symptoms Flat troponins  Hypokalemia Replenished to elevated levels -hold supplementation  ? UTI NGTD on culture D/c abx  A. fib Resume home meds -watch HR when patient out of bed  Elevated INR -from cipro -coumadin per pharmacy   DVT prophylaxis:  Fully anticoagulated   Code Status: DNR   Family Communication:   Disposition Plan:     Consultants:      Subjective: Feeling better today Does not have the same "stuffy head" feeling as yesterday when HR went up  Objective: Vitals:    12/23/16 1943 12/24/16 0356 12/24/16 0916 12/24/16 0919  BP: 115/65 (!) 122/48 (!) 146/76   Pulse: (!) 106 80  95  Resp: 18 18    Temp: 98.7 F (37.1 C) 97.9 F (36.6 C)    TempSrc: Oral Oral    SpO2: 98% 96%    Weight:  58.6 kg (129 lb 3 oz)    Height:   (1.626 m)      Intake/Output Summary (Last 24 hours) at 12/24/16 1230 Last data filed at 12/24/16 8119  Gross per 24 hour  Intake              243 ml  Output              700 ml  Net             -457 ml   Filed Weights   12/22/16 1629 12/23/16 0522 12/24/16 0356  Weight: 61.6 kg (135 lb 12.8 oz) 59.5 kg (131 lb 1.6 oz) 58.6 kg (129 lb 3 oz)    Examination:  General exam: NAD Respiratory system: clear, no wheezing Cardiovascular system: irr but rate controlled, no LE edema,mild redness on right foot at bony prominence on arch Gastrointestinal system: +BS, soft Central nervous system: A+Ox3 Extremities: moves all 4 ext Skin: no rash     Data Reviewed: I have personally reviewed following labs and imaging studies  CBC:  Recent Labs Lab 12/22/16 1153 12/24/16 0555  WBC 9.4 13.4*  NEUTROABS 7.8*  --   HGB 12.2 10.1*  HCT  39.3 32.6*  MCV 82.7 83.4  PLT 214 247   Basic Metabolic Panel:  Recent Labs Lab 12/22/16 1153 12/23/16 0534 12/24/16 0555  NA 140 140 138  K 3.2* 3.9 5.2*  CL 103 101 103  CO2 GLUCOSE 116* 115* 116*  BUN 24* 21* 29*  CREATININE 1.56* 1.37* 1.87*  CALCIUM 9.4 9.4 9.3   GFR: Estimated Creatinine Clearance: 15.5 mL/min (A) (by C-G formula based on SCr of 1.87 mg/dL (H)). Liver Function Tests:  Recent Labs Lab 12/22/16 1153  AST 15  ALT 14  ALKPHOS 59  BILITOT 1.0  PROT 6.8  ALBUMIN 3.3*   No results for input(s): LIPASE, AMYLASE in the last 168 hours. No results for input(s): AMMONIA in the last 168 hours. Coagulation Profile:  Recent Labs Lab 12/19/16 1153 12/22/16 1153 12/23/16 0534 12/24/16 0555  INR 4.3 2.62 2.48 3.47   Cardiac  Enzymes:  Recent Labs Lab 12/22/16 1153 12/23/16 1628  TROPONINI 0.03* 0.03*   BNP (last 3 results) No results for input(s): PROBNP in the last 8760 hours. HbA1C: No results for input(s): HGBA1C in the last 72 hours. CBG: No results for input(s): GLUCAP in the last 168 hours. Lipid Profile: No results for input(s): CHOL, HDL, LDLCALC, TRIG, CHOLHDL, LDLDIRECT in the last 72 hours. Thyroid Function Tests:  Recent Labs  12/22/16 1754  TSH 1.438   Anemia Panel: No results for input(s): VITAMINB12, FOLATE, FERRITIN, TIBC, IRON, RETICCTPCT in the last 72 hours. Urine analysis:    Component Value Date/Time   COLORURINE YELLOW 12/22/2016 1150   APPEARANCEUR CLEAR 12/22/2016 1150   LABSPEC 1.012 12/22/2016 1150   PHURINE 5.0 12/22/2016 1150   GLUCOSEU NEGATIVE 12/22/2016 1150   HGBUR NEGATIVE 12/22/2016 1150   BILIRUBINUR NEGATIVE 12/22/2016 1150   KETONESUR NEGATIVE 12/22/2016 1150   PROTEINUR 30 (A) 12/22/2016 1150   UROBILINOGEN 1.0 01/10/2015 2237   NITRITE NEGATIVE 12/22/2016 1150   LEUKOCYTESUR SMALL (A) 12/22/2016 1150     ) Recent Results (from the past 240 hour(s))  Culture, Urine     Status: None   Collection Time: 12/22/16 11:50 AM  Result Value Ref Range Status   Specimen Description URINE, RANDOM  Final   Special Requests NONE  Final   Culture NO GROWTH  Final   Report Status 12/23/2016 FINAL  Final  Culture, blood (Routine X 2) w Reflex to ID Panel     Status: None (Preliminary result)   Collection Time: 12/22/16  1:55 PM  Result Value Ref Range Status   Specimen Description BLOOD RIGHT HAND  Final   Special Requests   Final    BOTTLES DRAWN AEROBIC ONLY Blood Culture adequate volume   Culture NO GROWTH 1 DAY  Final   Report Status PENDING  Incomplete  Culture, blood (Routine X 2) w Reflex to ID Panel     Status: None (Preliminary result)   Collection Time: 12/22/16  2:04 PM  Result Value Ref Range Status   Specimen Description BLOOD LEFT HAND   Final   Special Requests   Final    BOTTLES DRAWN AEROBIC ONLY Blood Culture adequate volume   Culture NO GROWTH 1 DAY  Final   Report Status PENDING  Incomplete      Anti-infectives    Start     Dose/Rate Route Frequency Ordered Stop   12/23/16 1800  ciprofloxacin (CIPRO) tablet 500 mg  Status:  Discontinued     500 mg Oral Daily-1800 12/23/16 1035 12/24/16  1015   12/22/16 1500  ciprofloxacin (CIPRO) IVPB 400 mg  Status:  Discontinued     400 mg 200 mL/hr over 60 Minutes Intravenous Every 24 hours 12/22/16 1354 12/23/16 1035   12/22/16 1400  clindamycin (CLEOCIN) IVPB 300 mg  Status:  Discontinued     300 mg 100 mL/hr over 30 Minutes Intravenous  Once 12/22/16 1306 12/22/16 1354       Radiology Studies: No results found.      Scheduled Meds: . atorvastatin  10 mg Oral Q2000  . brimonidine  1 drop Both Eyes TID  . diltiazem  360 mg Oral Daily  . furosemide  80 mg Oral Daily  . isosorbide mononitrate  30 mg Oral Daily  . labetalol  200 mg Oral BID  . latanoprost  1 drop Both Eyes QHS  . levothyroxine  88 mcg Oral QAC breakfast  . pentoxifylline  400 mg Oral TID WC  . sodium chloride flush  3 mL Intravenous Q12H  . timolol  1 drop Both Eyes q morning - 10a  . Warfarin - Pharmacist Dosing Inpatient   Does not apply q1800   Continuous Infusions: . sodium chloride       LOS: 1 day    Time spent: 35 min    Claudy Abdallah U Vinod Mikesell, DO [facility] Pager [phone]  If 7PM-7AM, please contact night-coverage [email or web address] Password [email or web address] 12/24/2016, 12:30 PM

## 2016-12-24 NOTE — Progress Notes (Signed)
Ambulated with patient in hall. Patient tolerated well with front wheel walker. Went about 14ft.

## 2016-12-24 NOTE — Progress Notes (Signed)
Pt slept well during the night, Vitals stable, no any sign of SOB and distress noted, no any complain of pain, Synthroid given this am, Pure wick catheter continue, pt is in high low bed. will continue to monitor the patient.

## 2016-12-24 NOTE — Telephone Encounter (Signed)
Rx refill sent to pharmacy. 

## 2016-12-24 NOTE — Progress Notes (Signed)
ANTICOAGULATION CONSULT NOTE - Follow-up Consult  Pharmacy Consult for Warfarin Indication: atrial fibrillation  Allergies  Allergen Reactions  . Penicillins Hives and Swelling    Has patient had a PCN reaction causing immediate rash, facial/tongue/throat swelling, SOB or lightheadedness with hypotension: Yes Has patient had a PCN reaction causing severe rash involving mucus membranes or skin necrosis: Yes Has patient had a PCN reaction that required hospitalization No Has patient had a PCN reaction occurring within the last 10 years: No If all of the above answers are "NO", then may proceed with Cephalosporin use.    . Sulfa Antibiotics Anaphylaxis and Swelling  . Fish Allergy Nausea And Vomiting   Patient Measurements: Height: 5' 4"  (162.6 cm) Weight: 129 lb 3 oz (58.6 kg) IBW/kg (Calculated) : 54.7  Vital Signs: Temp: 97.9 F (36.6 C) (10/02 0356) Temp Source: Oral (10/02 0356) BP: 146/76 (10/02 0916) Pulse Rate: 95 (10/02 0919)  Labs:  Recent Labs  12/22/16 1153 12/23/16 0534 12/23/16 1628 12/24/16 0555  HGB 12.2  --   --  10.1*  HCT 39.3  --   --  32.6*  PLT 214  --   --  247  LABPROT 27.8* 26.6*  --  34.7*  INR 2.62 2.48  --  3.47  CREATININE 1.56* 1.37*  --  1.87*  TROPONINI 0.03*  --  0.03*  --    Estimated Creatinine Clearance: 15.5 mL/min (A) (by C-G formula based on SCr of 1.87 mg/dL (H)).  Medical History: Past Medical History:  Diagnosis Date  . Chronic a-fib ([place])   . Chronic back pain   . Chronic diastolic (congestive) heart failure (HCC)    a. Echo 3/17: mild LVH, EF 55%, no RWMA, mod AS (mean 10 mmHg, peak 21 mmHg), MAC, mild MR, severe LAE, mod reduced RVSF, mild RAE, mod TR, PASP 65 mmHg  . CKD (chronic kidney disease) stage 3, GFR 30-59 ml/min 09/11/2015  . Hyperlipidemia   . Hypertension   . Hypothyroidism   . PNA (pneumonia)   . Pulmonary hypertension (HCC)    PASP 65 mmHg at Echo 3/17  . Thyroid disease    Medications:  Home  Warfarin regimen: 12m on Wednesdays and 2.5712mall other days  *This dose was just lowered on 9/27 from 12m712mhree times a week and 2.5mg212ml other due to an INR of 4.3 (which had come down after an INR 6.22 the week prior for which warfarin was held for 2 days then resuming same regimen [facility]).   Assessment: 89 y35r old female on chronic warfarin for atrial fibrillation. Admit INR 2.62 - therapeutic on current home regimen.   INR supratherapeutic 3.47 - No overt bleeding documented, HgB down slightly 12.2>>10.1  Note drug interaction with Ciprofloxacin added for UTI; Cipro D/C'd after two doses  Goal of Therapy:  INR 2-3 Monitor platelets by anticoagulation protocol: Yes   Plan:  HOLD warfarin Daily INR given drug-drug interaction Monitor for s/sx of bleeding  TonyGeorga BoraarmD Clinical Pharmacist 12/24/2016 10:16 AM

## 2016-12-24 NOTE — Progress Notes (Addendum)
Chaplain introduced herself to patient.  Patient is very pleasant and encouraging to talk with.  I learned a little about her and her story around church and taking care of herself at her home. [name] says she has a car and a truck and she drives herself to and from church.  I loved hearing her stories of her and her family and the lady at church who keeps mistaking her cane for hers.  Such wonderful laughs.  Prayed with her and will continue to follow as needed.  Thanks to all the medical staff and others for caring for this precious soul.    12/24/16 1238  Clinical Encounter Type  Visited With Patient  Visit Type Initial;Psychological support;Spiritual support;Social support

## 2016-12-25 ENCOUNTER — Inpatient Hospital Stay (HOSPITAL_COMMUNITY): Payer: Medicare Other

## 2016-12-25 DIAGNOSIS — I361 Nonrheumatic tricuspid (valve) insufficiency: Secondary | ICD-10-CM

## 2016-12-25 LAB — BASIC METABOLIC PANEL
ANION GAP: 12 (ref 5–15)
BUN: 36 mg/dL — AB (ref 6–20)
CO2: 25 mmol/L (ref 22–32)
Calcium: 9.3 mg/dL (ref 8.9–10.3)
Chloride: 101 mmol/L (ref 101–111)
Creatinine, Ser: 2.1 mg/dL — ABNORMAL HIGH (ref 0.44–1.00)
GFR calc Af Amer: 22 mL/min — ABNORMAL LOW (ref >60)
GFR, EST NON AFRICAN AMERICAN: 19 mL/min — AB (ref >60)
Glucose, Bld: 122 mg/dL — ABNORMAL HIGH (ref 65–99)
POTASSIUM: 4.3 mmol/L (ref 3.5–5.1)
SODIUM: 138 mmol/L (ref 135–145)

## 2016-12-25 LAB — ECHOCARDIOGRAM COMPLETE
Height: 64 in
WEIGHTICAEL: 2089.6 [oz_av]

## 2016-12-25 LAB — CBC
HCT: 31.1 % — ABNORMAL LOW (ref 36.0–46.0)
Hemoglobin: 9.5 g/dL — ABNORMAL LOW (ref 12.0–15.0)
MCH: 25.5 pg — AB (ref 26.0–34.0)
MCHC: 30.5 g/dL (ref 30.0–36.0)
MCV: 83.4 fL (ref 78.0–100.0)
PLATELETS: 227 10*3/uL (ref 150–400)
RBC: 3.73 MIL/uL — AB (ref 3.87–5.11)
RDW: 18.1 % — ABNORMAL HIGH (ref 11.5–15.5)
WBC: 12.4 10*3/uL — AB (ref 4.0–10.5)

## 2016-12-25 LAB — PROTIME-INR
INR: 3.29
PROTHROMBIN TIME: 33.2 s — AB (ref 11.4–15.2)

## 2016-12-25 MED ORDER — BOOST / RESOURCE BREEZE PO LIQD
1.0000 | Freq: Two times a day (BID) | ORAL | Status: DC
  Administered 2016-12-25 – 2016-12-27 (×5): 1 via ORAL

## 2016-12-25 NOTE — Plan of Care (Signed)
Problem: Safety: Goal: Ability to remain free from injury will improve Outcome: Progressing Patient is calling when needing to get up to the bathroom. Patient is not attempted to get up alone.   Problem: Activity: Goal: Risk for activity intolerance will decrease Outcome: Progressing Patient is ambulating in hallway ans tolerating well.

## 2016-12-25 NOTE — Progress Notes (Signed)
Called Dr about patients BUN and Creatine being elevated. Wanting to know if should still give morning dose lasix. Did not receive call back. Talked to pharmacy, stated to go ahead and give the dose.

## 2016-12-25 NOTE — Progress Notes (Signed)
  Echocardiogram 2D Echocardiogram has been performed.  Leilanny Fluitt T Saydee Zolman 12/25/2016, 4:08 PM

## 2016-12-25 NOTE — Progress Notes (Signed)
ANTICOAGULATION CONSULT NOTE - Follow-up Consult  Pharmacy Consult for Warfarin Indication: atrial fibrillation  Allergies  Allergen Reactions  . Penicillins Hives and Swelling    Has patient had a PCN reaction causing immediate rash, facial/tongue/throat swelling, SOB or lightheadedness with hypotension: Yes Has patient had a PCN reaction causing severe rash involving mucus membranes or skin necrosis: Yes Has patient had a PCN reaction that required hospitalization No Has patient had a PCN reaction occurring within the last 10 years: No If all of the above answers are "NO", then may proceed with Cephalosporin use.    . Sulfa Antibiotics Anaphylaxis and Swelling  . Fish Allergy Nausea And Vomiting   Patient Measurements: Height: _0  (162.6 cm) Weight: 130 lb 9.6 oz (59.2 kg) IBW/kg (Calculated) : 54.7  Vital Signs: Temp: 97.8 F (36.6 C) (10/03 0643) Temp Source: Oral (10/03 0643) BP: 106/71 (10/03 0643) Pulse Rate: 92 (10/03 0643)  Labs:  Recent Labs  12/22/16 1153 12/23/16 0534 12/23/16 1628 12/24/16 0555 12/25/16 0345  HGB 12.2  --   --  10.1* 9.5*  HCT 39.3  --   --  32.6* 31.1*  PLT 214  --   --  247 227  LABPROT 27.8* 26.6*  --  34.7* 33.2*  INR 2.62 2.48  --  3.47 3.29  CREATININE 1.56* 1.37*  --  1.87* 2.10*  TROPONINI 0.03*  --  0.03*  --   --    Estimated Creatinine Clearance: 13.8 mL/min (A) (by C-G formula based on SCr of 2.1 mg/dL (H)).  Medical History: Past Medical History:  Diagnosis Date  . Chronic a-fib ([place])   . Chronic back pain   . Chronic diastolic (congestive) heart failure (HCC)    a. Echo 3/17: mild LVH, EF 55%, no RWMA, mod AS (mean 10 mmHg, peak 21 mmHg), MAC, mild MR, severe LAE, mod reduced RVSF, mild RAE, mod TR, PASP 65 mmHg  . CKD (chronic kidney disease) stage 3, GFR 30-59 ml/min 09/11/2015  . Hyperlipidemia   . Hypertension   . Hypothyroidism   . PNA (pneumonia)   . Pulmonary hypertension (HCC)    PASP 65 mmHg at Echo  3/17  . Thyroid disease    Medications:  Home Warfarin regimen: 22m on Wednesdays and 2.574mall other days  *This dose was just lowered on 9/27 from 49m49mhree times a week and 2.5mg24ml other due to an INR of 4.3 (which had come down after an INR 6.22 the week prior for which warfarin was held for 2 days then resuming same regimen [facility]).   Assessment: 89 y15r old female on chronic warfarin for atrial fibrillation. Admit INR 2.62 - therapeutic on current home regimen.   INR supratherapeutic but trending down 3.47>>3.29 after one dose held and Cipro D/C'd - No overt bleeding documented, HgB continues to trend down slightly 12.2>>10.1>>9.5  Note drug interaction with Ciprofloxacin added for UTI  Goal of Therapy:  INR 2-3 Monitor platelets by anticoagulation protocol: Yes   Plan:  HOLD warfarin Daily INR/CBC Monitor for s/sx of bleeding F/U Fecal Occult Blood results  TonyGeorga BoraarmD Clinical Pharmacist 12/25/2016 8:50 AM

## 2016-12-25 NOTE — Progress Notes (Signed)
PROGRESS NOTE    KYNSLEI ART  UJW:[id number] DOB: 12/24/1919 DOA: 12/22/2016 PCP: Burton Apley, MD   Outpatient Specialists:    Brief Narrative:  83 year old fairly active and independent female with chronic right heart failure and severe pulmonary hypertension, chronic A. fib, status post 3 kidney disease and hypertension presented with generalized weakness for past 2 weeks which has been progressive. Also noticed increased lower leg swelling   Assessment & Plan:   Principal Problem:   Acute on chronic right heart failure (HCC) Active Problems:   CKD (chronic kidney disease), stage III w/ renal artery stenosis   Cellulitis of lower extremity   Chronic a-fib (HCC)   Hypothyroidism   Acute diastolic heart failure (HCC)   Elevated troponin   Acute hypokalemia   HTN (hypertension)   Dyslipidemia   Pulmonary hypertension, moderate to severe (HCC)   Abnormal urinalysis   Acute on chronic diastolic CHF Also has severe pulmonary hypertension and chronic right heart failure. IV Lasix 80 mg every 12 hours x 3 doses then resume home dose - strict I/when daily weight- down 2.2 L per I/Os - Last 2-D echo from 05/2015 -will repeat echo  CKD- stage 3 -monitor Cr while in hospital- up with diuresis -daily BMP  Anemia of CD -? From CKD -check stools -Fe panel in AM  Elevated troponin Mild, suspect secondary to fluid overload, A. fib. No chest pain symptoms Flat troponins  Hypokalemia Replenished to elevated levels -hold supplementation  ? UTI NGTD on culture D/c abx   A. fib Resume home meds -watch HR when patient out of bed- upper limits of normal but not >100 -close cardiology follow up  Elevated INR -from cipro -coumadin per pharmacy -trend as Hgb dropping  Per patient, niece to arrange follow up with podiatry for issues with feet/toenails  DVT prophylaxis:  Fully anticoagulated   Code Status: DNR   Family Communication: Called both  nieces listed as MPOA, no answer  Disposition Plan:  Home once work up complete   Consultants:      Subjective: No overnight events  Objective: Vitals:   12/24/16 2139 12/25/16 0643 12/25/16 0924 12/25/16 1112  BP: 119/73 106/71 (!) 133/56 108/73  Pulse: 84 92 86 80  Resp: 20 20    Temp: 98.5 F (36.9 C) 97.8 F (36.6 C)    TempSrc: Oral Oral    SpO2: 100% 100% 98%   Weight:  59.2 kg (130 lb 9.6 oz)    Height:        Intake/Output Summary (Last 24 hours) at 12/25/16 1150 Last data filed at 12/25/16 5621  Gross per 24 hour  Intake              483 ml  Output              300 ml  Net              183 ml   Filed Weights   12/23/16 0522 12/24/16 0356 12/25/16 0643  Weight: 59.5 kg (131 lb 1.6 oz) 58.6 kg (129 lb 3 oz) 59.2 kg (130 lb 9.6 oz)    Examination:  General exam: in bed Respiratory system: no wheezing Cardiovascular system: irr, no LE edema Gastrointestinal system: +BS, soft Central nervous system: A+Ox3 Extremities: moves all 4 ext Skin: no rash     Data Reviewed: I have personally reviewed following labs and imaging studies  CBC:  Recent Labs Lab 12/22/16 1153 12/24/16 0555 12/25/16 0345  WBC 9.4  13.4* 12.4*  NEUTROABS 7.8*  --   --   HGB 12.2 10.1* 9.5*  HCT 39.3 32.6* 31.1*  MCV 82.7 83.4 83.4  PLT 214 247 227   Basic Metabolic Panel:  Recent Labs Lab 12/22/16 1153 12/23/16 0534 12/24/16 0555 12/25/16 0345  NA 140 140 138 138  K 3.2* 3.9 5.2* 4.3  CL 103 101 103 101  CO2 GLUCOSE 116* 115* 116* 122*  BUN 24* 21* 29* 36*  CREATININE 1.56* 1.37* 1.87* 2.10*  CALCIUM 9.4 9.4 9.3 9.3   GFR: Estimated Creatinine Clearance: 13.8 mL/min (A) (by C-G formula based on SCr of 2.1 mg/dL (H)). Liver Function Tests:  Recent Labs Lab 12/22/16 1153  AST 15  ALT 14  ALKPHOS 59  BILITOT 1.0  PROT 6.8  ALBUMIN 3.3*   No results for input(s): LIPASE, AMYLASE in the last 168 hours. No results for input(s): AMMONIA  in the last 168 hours. Coagulation Profile:  Recent Labs Lab 12/19/16 1153 12/22/16 1153 12/23/16 0534 12/24/16 0555 12/25/16 0345  INR 4.3 2.62 2.48 3.47 3.29   Cardiac Enzymes:  Recent Labs Lab 12/22/16 1153 12/23/16 1628  TROPONINI 0.03* 0.03*   BNP (last 3 results) No results for input(s): PROBNP in the last 8760 hours. HbA1C: No results for input(s): HGBA1C in the last 72 hours. CBG: No results for input(s): GLUCAP in the last 168 hours. Lipid Profile: No results for input(s): CHOL, HDL, LDLCALC, TRIG, CHOLHDL, LDLDIRECT in the last 72 hours. Thyroid Function Tests:  Recent Labs  12/22/16 1754  TSH 1.438   Anemia Panel: No results for input(s): VITAMINB12, FOLATE, FERRITIN, TIBC, IRON, RETICCTPCT in the last 72 hours. Urine analysis:    Component Value Date/Time   COLORURINE YELLOW 12/22/2016 1150   APPEARANCEUR CLEAR 12/22/2016 1150   LABSPEC 1.012 12/22/2016 1150   PHURINE 5.0 12/22/2016 1150   GLUCOSEU NEGATIVE 12/22/2016 1150   HGBUR NEGATIVE 12/22/2016 1150   BILIRUBINUR NEGATIVE 12/22/2016 1150   KETONESUR NEGATIVE 12/22/2016 1150   PROTEINUR 30 (A) 12/22/2016 1150   UROBILINOGEN 1.0 01/10/2015 2237   NITRITE NEGATIVE 12/22/2016 1150   LEUKOCYTESUR SMALL (A) 12/22/2016 1150     ) Recent Results (from the past 240 hour(s))  Culture, Urine     Status: None   Collection Time: 12/22/16 11:50 AM  Result Value Ref Range Status   Specimen Description URINE, RANDOM  Final   Special Requests NONE  Final   Culture NO GROWTH  Final   Report Status 12/23/2016 FINAL  Final  Culture, blood (Routine X 2) w Reflex to ID Panel     Status: None (Preliminary result)   Collection Time: 12/22/16  1:55 PM  Result Value Ref Range Status   Specimen Description BLOOD RIGHT HAND  Final   Special Requests   Final    BOTTLES DRAWN AEROBIC ONLY Blood Culture adequate volume   Culture NO GROWTH 2 DAYS  Final   Report Status PENDING  Incomplete  Culture, blood  (Routine X 2) w Reflex to ID Panel     Status: None (Preliminary result)   Collection Time: 12/22/16  2:04 PM  Result Value Ref Range Status   Specimen Description BLOOD LEFT HAND  Final   Special Requests   Final    BOTTLES DRAWN AEROBIC ONLY Blood Culture adequate volume   Culture NO GROWTH 2 DAYS  Final   Report Status PENDING  Incomplete      Anti-infectives    Start  Dose/Rate Route Frequency Ordered Stop   12/23/16 1800  ciprofloxacin (CIPRO) tablet 500 mg  Status:  Discontinued     500 mg Oral Daily-1800 12/23/16 1035 12/24/16 1015   12/22/16 1500  ciprofloxacin (CIPRO) IVPB 400 mg  Status:  Discontinued     400 mg 200 mL/hr over 60 Minutes Intravenous Every 24 hours 12/22/16 1354 12/23/16 1035   12/22/16 1400  clindamycin (CLEOCIN) IVPB 300 mg  Status:  Discontinued     300 mg 100 mL/hr over 30 Minutes Intravenous  Once 12/22/16 1306 12/22/16 1354       Radiology Studies: No results found.      Scheduled Meds: . atorvastatin  10 mg Oral Q2000  . brimonidine  1 drop Both Eyes TID  . diltiazem  360 mg Oral Daily  . feeding supplement  1 Container Oral BID BM  . furosemide  80 mg Oral Daily  . isosorbide mononitrate  30 mg Oral Daily  . labetalol  200 mg Oral BID  . latanoprost  1 drop Both Eyes QHS  . levothyroxine  88 mcg Oral QAC breakfast  . pentoxifylline  400 mg Oral TID WC  . sodium chloride flush  3 mL Intravenous Q12H  . timolol  1 drop Both Eyes q morning - 10a  . Warfarin - Pharmacist Dosing Inpatient   Does not apply q1800   Continuous Infusions: . sodium chloride       LOS: 2 days    Time spent: 25 min    Akshara Blumenthal U Brentley Landfair, DO [facility] Pager [phone]  If 7PM-7AM, please contact night-coverage [email or web address] Password [email or web address] 12/25/2016, 11:50 AM

## 2016-12-26 DIAGNOSIS — R748 Abnormal levels of other serum enzymes: Secondary | ICD-10-CM

## 2016-12-26 LAB — IRON AND TIBC
IRON: 41 ug/dL (ref 28–170)
SATURATION RATIOS: 13 % (ref 10.4–31.8)
TIBC: 314 ug/dL (ref 250–450)
UIBC: 273 ug/dL

## 2016-12-26 LAB — PROTIME-INR
INR: 1.9
Prothrombin Time: 21.6 seconds — ABNORMAL HIGH (ref 11.4–15.2)

## 2016-12-26 LAB — BASIC METABOLIC PANEL
Anion gap: 13 (ref 5–15)
BUN: 33 mg/dL — ABNORMAL HIGH (ref 6–20)
CALCIUM: 9.3 mg/dL (ref 8.9–10.3)
CHLORIDE: 99 mmol/L — AB (ref 101–111)
CO2: 26 mmol/L (ref 22–32)
Creatinine, Ser: 2.01 mg/dL — ABNORMAL HIGH (ref 0.44–1.00)
GFR, EST AFRICAN AMERICAN: 23 mL/min — AB (ref >60)
GFR, EST NON AFRICAN AMERICAN: 20 mL/min — AB (ref >60)
Glucose, Bld: 185 mg/dL — ABNORMAL HIGH (ref 65–99)
Potassium: 3.5 mmol/L (ref 3.5–5.1)
SODIUM: 138 mmol/L (ref 135–145)

## 2016-12-26 LAB — CBC
HCT: 35.2 % — ABNORMAL LOW (ref 36.0–46.0)
Hemoglobin: 10.9 g/dL — ABNORMAL LOW (ref 12.0–15.0)
MCH: 25.6 pg — ABNORMAL LOW (ref 26.0–34.0)
MCHC: 31 g/dL (ref 30.0–36.0)
MCV: 82.8 fL (ref 78.0–100.0)
PLATELETS: 250 10*3/uL (ref 150–400)
RBC: 4.25 MIL/uL (ref 3.87–5.11)
RDW: 17.6 % — AB (ref 11.5–15.5)
WBC: 12.2 10*3/uL — AB (ref 4.0–10.5)

## 2016-12-26 LAB — FERRITIN: Ferritin: 92 ng/mL (ref 11–307)

## 2016-12-26 MED ORDER — POTASSIUM CHLORIDE CRYS ER 20 MEQ PO TBCR
20.0000 meq | EXTENDED_RELEASE_TABLET | Freq: Every day | ORAL | Status: DC
  Administered 2016-12-26 – 2016-12-27 (×2): 20 meq via ORAL
  Filled 2016-12-26 (×2): qty 1

## 2016-12-26 MED ORDER — WARFARIN SODIUM 2 MG PO TABS
2.0000 mg | ORAL_TABLET | Freq: Once | ORAL | Status: AC
  Administered 2016-12-26: 2 mg via ORAL
  Filled 2016-12-26: qty 1

## 2016-12-26 MED ORDER — LABETALOL HCL 200 MG PO TABS
200.0000 mg | ORAL_TABLET | Freq: Three times a day (TID) | ORAL | Status: DC
  Administered 2016-12-26 – 2016-12-27 (×3): 200 mg via ORAL
  Filled 2016-12-26 (×3): qty 1

## 2016-12-26 MED ORDER — FUROSEMIDE 20 MG PO TABS
80.0000 mg | ORAL_TABLET | Freq: Every day | ORAL | Status: DC
  Administered 2016-12-27: 80 mg via ORAL
  Filled 2016-12-26: qty 4

## 2016-12-26 NOTE — Evaluation (Signed)
Physical Therapy Evaluation Patient Details Name: Tonya Farley MRN: [id number] DOB: 1919/11/27 Today's Date: 12/26/2016   History of Present Illness  83 year old fairly active and independent female with chronic right heart failure and severe pulmonary hypertension, chronic A. fib, status post 3 kidney disease and hypertension presented with generalized weakness for past 2 weeks which has been progressive. Also noticed increased lower leg swelling. Larey Seat 12/22/16  Clinical Impression  Pt admitted with above diagnosis. Pt currently with functional limitations due to the deficits listed below (see PT Problem List). Pt is limited by increased HR and lightheadedness with movement. Pt is currently, supervision for bed mobility, minA for transfers and min guard for ambulation of 12 feet with RW. PT recommended to pt SNF however pt refuses at this time. PT educated on need to have 24 hr care if not willing to go to SNF. Pt reports her nieces are working on finding help. Pt will benefit from skilled PT to increase their independence and safety with mobility to allow discharge to the venue listed below.       Follow Up Recommendations Home health PT;Supervision/Assistance - 24 hour    Equipment Recommendations  None recommended by PT    Recommendations for Other Services       Precautions / Restrictions Precautions Precautions: None Restrictions Weight Bearing Restrictions: No      Mobility  Bed Mobility Overal bed mobility: Needs Assistance Bed Mobility: Supine to Sit     Supine to sit: Supervision     General bed mobility comments: supervision for safety, able to reach across to bedrail to pull herself up to sitting  Transfers Overall transfer level: Needs assistance Equipment used: 1 person hand held assist;Rolling walker (2 wheeled) Transfers: Sit to/from [employer] Sit to Stand: Min guard Stand pivot transfers: Min assist       General transfer comment:  minA for steadying with stand pivot transfer to BSC, min guard for sit>stand to RW good power up and steadying   Ambulation/Gait Ambulation/Gait assistance: Min guard Ambulation Distance (Feet): 12 Feet Assistive device: Rolling walker (2 wheeled) Gait Pattern/deviations: Decreased step length - right;Decreased step length - left;Shuffle Gait velocity: slowed Gait velocity interpretation: Below normal speed for age/gender General Gait Details: min guard for safety, c/o of lightheadedness with movement, vc for staying within RW as she walked around foot of bed to recliner        Balance Overall balance assessment: Needs assistance Sitting-balance support: No upper extremity supported;Feet supported Sitting balance-Leahy Scale: Good     Standing balance support: Bilateral upper extremity supported Standing balance-Leahy Scale: Fair Standing balance comment: requires UE support to maintain standing balance                             Pertinent Vitals/Pain Pain Assessment: No/denies pain    Home Living Family/patient expects to be discharged to:: Private residence Living Arrangements: Alone Available Help at Discharge: Available PRN/intermittently;Family;Friend(s) Type of Home: House Home Access: Stairs to enter Entrance Stairs-Rails: Left Entrance Stairs-Number of Steps: 2 Home Layout: One level Home Equipment: Walker - 2 wheels;Walker - 4 wheels;Cane - single point      Prior Function Level of Independence: Independent with assistive device(s)         Comments: uses cane in house, walker in public, drives, does shopping at [employer], has cleaning lady who does grocery shopping     Hand Dominance   Dominant Hand: Right  Extremity/Trunk Assessment   Upper Extremity Assessment Upper Extremity Assessment: Generalized weakness    Lower Extremity Assessment Lower Extremity Assessment: RLE deficits/detail;LLE deficits/detail RLE Deficits /  Details: prior c/o zipping, nerve pain, not currently experiencing, ROM WFL, strength grossly 3/5 RLE Sensation: decreased light touch LLE Deficits / Details: prior c/o zipping, nerve pain, not currently experiencing, ROM WFL, strength grossly 3/5 LLE Sensation: decreased light touch       Communication   Communication: No difficulties  Cognition Arousal/Alertness: Awake/alert Behavior During Therapy: WFL for tasks assessed/performed Overall Cognitive Status: Within Functional Limits for tasks assessed                                        General Comments General comments (skin integrity, edema, etc.): in supine HR 72bpm, with movement to BSC HR fluctuating between min 76bpm-max 89bpm, pt with c/o of lightheadedness so ambulation limited to walk around foot of bed to sit in recliner. HR with this limited ambulation reached a max of 117 bpm.         Assessment/Plan    PT Assessment Patient needs continued PT services  PT Problem List Decreased strength;Decreased activity tolerance;Decreased balance;Decreased mobility;Cardiopulmonary status limiting activity;Impaired sensation       PT Treatment Interventions DME instruction;Gait training;Stair training;Functional mobility training;Therapeutic activities;Therapeutic exercise;Balance training;Patient/family education    PT Goals (Current goals can be found in the Care Plan section)  Acute Rehab PT Goals Patient Stated Goal: to go home PT Goal Formulation: With patient Time For Goal Achievement: 01/09/17 Potential to Achieve Goals: Fair    Frequency Min 3X/week   Barriers to discharge Decreased caregiver support pt nieces are working on 24 hr care       AM-PAC PT "6 Clicks" Daily Activity  Outcome Measure Difficulty turning over in bed (including adjusting bedclothes, sheets and blankets)?: A Lot Difficulty moving from lying on back to sitting on the side of the bed? : A Lot Difficulty sitting down on and  standing up from a chair with arms (e.g., wheelchair, bedside commode, etc,.)?: Unable Help needed moving to and from a bed to chair (including a wheelchair)?: A Little Help needed walking in hospital room?: A Little Help needed climbing 3-5 steps with a railing? : A Lot 6 Click Score: 13    End of Session Equipment Utilized During Treatment: Gait belt Activity Tolerance: Patient tolerated treatment well Patient left: in chair;with call bell/phone within reach;with chair alarm set Nurse Communication: Mobility status PT Visit Diagnosis: Unsteadiness on feet (R26.81);Other abnormalities of gait and mobility (R26.89);Muscle weakness (generalized) (M62.81);Difficulty in walking, not elsewhere classified (R26.2);Dizziness and giddiness (R42)    Time: 1610-9604 PT Time Calculation (min) (ACUTE ONLY): 33 min   Charges:   PT Evaluation $PT Eval Moderate Complexity: 1 Mod PT Treatments $Therapeutic Activity: 8-22 mins   PT G Codes:        Voyd Groft B. Beverely Risen PT, DPT Acute Rehabilitation  [phone] Pager [phone]    Elon Alas Fleet 12/26/2016, 1:14 PM

## 2016-12-26 NOTE — Consult Note (Signed)
Cardiology Consult    Patient ID: KIAYA HALIBURTON MRN: [id number], DOB/AGE: 06-25-1919   Admit date: 12/22/2016 Date of Consult: 12/26/2016  Primary Physician: Lorene Dy, MD Primary Cardiologist: Irish Lack Requesting Provider: Eliseo Squires Reason for Consultation: New reduced EF  Gracelynn MLISSA TAMAYO is a 83 y.o. female who is being seen today for the evaluation of reduced EF at the request of Dr. Eliseo Squires.   Patient Profile    83 yo female with PMH of chronic AFib, diastolic HF, CKD III, HTN, hypothyroidism, pulmonary HTN who presented with worsening edema/weakness and found to have new systolic HF.   Past Medical History   Past Medical History:  Diagnosis Date  . Chronic a-fib ([place])   . Chronic back pain   . Chronic diastolic (congestive) heart failure (H[facility])    a. Echo 3/17: mild LVH, EF 55%, no RWMA, mod AS (mean 10 mmHg, peak 21 mmHg), MAC, mild MR, severe LAE, mod reduced RVSF, mild RAE, mod TR, PASP 65 mmHg  . CKD (chronic kidney disease) stage 3, GFR 30-59 ml/min 09/11/2015  . Hyperlipidemia   . Hypertension   . Hypothyroidism   . PNA (pneumonia)   . Pulmonary hypertension (HCC)    PASP 65 mmHg at Echo 3/17  . Thyroid disease     Past Surgical History:  Procedure Laterality Date  . APPENDECTOMY    . cataracts     bilateral  . EYE SURGERY     left eye "hole"  . TONSILLECTOMY       Allergies  Allergies  Allergen Reactions  . Penicillins Hives and Swelling    Has patient had a PCN reaction causing immediate rash, facial/tongue/throat swelling, SOB or lightheadedness with hypotension: Yes Has patient had a PCN reaction causing severe rash involving mucus membranes or skin necrosis: Yes Has patient had a PCN reaction that required hospitalization No Has patient had a PCN reaction occurring within the last 10 years: No If all of the above answers are "NO", then may proceed with Cephalosporin use.    . Sulfa Antibiotics Anaphylaxis and Swelling  . Fish Allergy Nausea  And Vomiting    History of Present Illness    Mrs. Hass is a 83 yo female with PMH of chronic AFib, diastolic HF, CKD III, HTN, hypothyroidism, pulmonary HTN. She was admitted last year back in march then again in May with dyspnea in the setting of PNA and AFib RVR with pleural effusions that required thoracentesis. Echo back in 3/17 showed some RV failure and PASP 16mHg. Given her pulmonary HTN she was started on Revatio 247mTID, but had to stop this medication 2/2 to cost and side effects. She was last seen in the office on 2/18 by Dr. VaIrish LackReported fatigue and dyspnea on exertion, but overall able. Lasix dose at that time was 8078mn am and 48m72m pm. EKG showed Afib with rate controlled. On coumadin for OAC.[place] lives at home independently and still drives. Had been in her usual state of health until Sunday. Reports she was attempting to get out of bed and had a sudden onset of weakness, and felt as though she may pass out. Laid in the bed for awhile longer but the feeling would not pass. She pressed her Life Alert and called EMS. Does report that over the past month she has noticed swelling in her feet and legs at times. Tends to improve with elevation. No chest pain, palpitations, n/v, or diaphoresis.   In the  ED she was noted to be in Afib with rate controlled. On admission BNP 851, stable Hgb, mild hypokalemia, Trop 0.03 x2. CXR without significant edema. Initially given IVFs in the ED, but now on IV lasix. Was treated with antibiotics for UTI, but culture negative. INR has been therapeutic this admission. Echo with new finding of systolic HF with EF of 83% with diffuse hypokinesis, moderate/sever TR and pulmonary hypertension. Net - 2.2L, with rising Cr in the setting of diuresis.   Inpatient Medications    . atorvastatin  10 mg Oral Q2000  . brimonidine  1 drop Both Eyes TID  . diltiazem  360 mg Oral Daily  . feeding supplement  1 Container Oral BID BM  . [START ON  12/27/2016] furosemide  80 mg Oral Daily  . isosorbide mononitrate  30 mg Oral Daily  . labetalol  200 mg Oral BID  . latanoprost  1 drop Both Eyes QHS  . levothyroxine  88 mcg Oral QAC breakfast  . pentoxifylline  400 mg Oral TID WC  . sodium chloride flush  3 mL Intravenous Q12H  . timolol  1 drop Both Eyes q morning - 10a  . Warfarin - Pharmacist Dosing Inpatient   Does not apply q1800    Family History    Family History  Problem Relation Age of Onset  . Heart disease Sister   . Heart disease Sister   . Cancer Mother   . Other Father   . Heart attack Father     Social History    Social History   Social History  . Marital status: Widowed    Spouse name: N/A  . Number of children: N/A  . Years of education: N/A   Occupational History  . Not on file.   Social History Main Topics  . Smoking status: Never Smoker  . Smokeless tobacco: Never Used  . Alcohol use No  . Drug use: No  . Sexual activity: Not on file   Other Topics Concern  . Not on file   Social History Narrative  . No narrative on file     Review of Systems    See HPI  All other systems reviewed and are otherwise negative except as noted above.  Physical Exam    Blood pressure (!) 149/52, pulse 76, temperature 98.3 F (36.8 C), temperature source Oral, resp. rate 18, height 5' 4"  (1.626 m), weight 131 lb 3.2 oz (59.5 kg), SpO2 96 %.  General: Pleasant, older WF, NAD Psych: Normal affect. Neuro: Alert and oriented X 3. Moves all extremities spontaneously. HEENT: Normal  Neck: Supple, mild JVD. Lungs:  Resp regular and unlabored, CTA. Heart: Irreg Irreg no s3, s4, soft systolic murmur. Abdomen: Soft, non-tender, non-distended, BS + x 4.  Extremities: No clubbing, cyanosis, trace pretibial edema. DP/PT/Radials 2+ and equal bilaterally.  Labs    Troponin (Point of Care Test) No results for input(s): TROPIPOC in the last 72 hours.  Recent Labs  12/23/16 1628  TROPONINI 0.03*   Lab  Results  Component Value Date   WBC 12.4 (H) 12/25/2016   HGB 9.5 (L) 12/25/2016   HCT 31.1 (L) 12/25/2016   MCV 83.4 12/25/2016   PLT 227 12/25/2016    Recent Labs Lab 12/22/16 1153  12/25/16 0345  NA 140  < > 138  K 3.2*  < > 4.3  CL 103  < > 101  CO2 29  < > 25  BUN 24*  < > 36*  CREATININE  1.56*  < > 2.10*  CALCIUM 9.4  < > 9.3  PROT 6.8  --   --   BILITOT 1.0  --   --   ALKPHOS 59  --   --   ALT 14  --   --   AST 15  --   --   GLUCOSE 116*  < > 122*  < > = values in this interval not displayed. Lab Results  Component Value Date   CHOL 155 11/28/2015   HDL 58 11/28/2015   LDLCALC 73 11/28/2015   TRIG 122 11/28/2015   No results found for: [facility]   Radiology Studies    Dg Chest 2 View  Result Date: 12/22/2016 CLINICAL DATA:  Generalized weakness and dizziness for 4 days. EXAM: CHEST  2 VIEW COMPARISON:  08/28/2016 and prior radiographs FINDINGS: Cardiomegaly again identified. Left basilar atelectasis/ scarring again noted. There is no evidence of focal airspace disease, pulmonary edema, suspicious pulmonary nodule/mass, pleural effusion, or pneumothorax. No acute bony abnormalities are identified. Lower thoracic/upper lumbar compression fractures and vertebral augmentation changes again noted. IMPRESSION: Cardiomegaly without evidence of acute cardiopulmonary disease. Electronically Signed   By: Margarette Canada M.D.   On: 12/22/2016 10:50   Ct Head Wo Contrast  Result Date: 12/22/2016 CLINICAL DATA:  83 year old female with generalized weakness and dizziness for 4 days. EXAM: CT HEAD WITHOUT CONTRAST TECHNIQUE: Contiguous axial images were obtained from the base of the skull through the vertex without intravenous contrast. COMPARISON:  04/12/2008 FINDINGS: Brain: No evidence of acute infarction, hemorrhage, hydrocephalus, extra-axial collection or mass lesion/mass effect. Atrophy and mild chronic small-vessel white matter ischemic changes again noted Vascular:  Atherosclerotic calcifications again noted. Skull: Normal. Negative for fracture or focal lesion. Sinuses/Orbits: No acute finding. Other: None IMPRESSION: 1. No evidence of acute intracranial abnormality 2. Atrophy and mild chronic small-vessel white matter ischemic changes. Electronically Signed   By: Margarette Canada M.D.   On: 12/22/2016 10:53    ECG & Cardiac Imaging    EKG: Afib rate controlled  Echo: 12/25/16  Study Conclusions  - Left ventricle: The cavity size was normal. Wall thickness was   increased in a pattern of mild LVH. Indeterminant diastolic   function (atrial fibrillation). The estimated ejection fraction   was 35%. Diffuse hypokinesis. - Aortic valve: Trileaflet; moderately calcified leaflets.   Sclerosis without stenosis. There was trivial regurgitation. - Mitral valve: Mildly calcified annulus. Mildly calcified leaflets   . There was trivial regurgitation. - Left atrium: The atrium was moderately dilated. - Right ventricle: The cavity size was mildly dilated. Systolic   function was mildly reduced. - Right atrium: The atrium was moderately dilated. - Tricuspid valve: There was moderate-severe regurgitation. Peak   RV-RA gradient (S): 66 mm Hg. - Pulmonary arteries: PA peak pressure: 69 mm Hg (S). - Inferior vena cava: The vessel was normal in size. The   respirophasic diameter changes were in the normal range (>= 50%),   consistent with normal central venous pressure. - Pericardium, extracardiac: A trivial pericardial effusion was   identified.  Impressions:  - The patient was in atrial fibrillation. Normal LV size with mild   LV hypertrophy. EF 35% with diffuse hypokinesis. Mildly dilated   RV with mildly decreased systolic function. Aortic valve   sclerosis without significant stenosis. Moderate to severe   tricuspid regurgitation. Moderate to severe pulmonary   hypertension.  Assessment & Plan    83 yo female with PMH of chronic AFib, diastolic HF,  CKD III, HTN, hypothyroidism, pulmonary HTN who presented with worsening edema/weakness and found to have new systolic HF.   1. Acute systolic HF: Presented with weakness and LEE and found to have a newly reduced EF of 35% with diffuse hypokinesis. Denies any chest pain, orthopnea or PND. Currently diuresing with IV lasix, net - 2L. Weight actually trending up by 2 lbs, but now with rising Cr. Appears comfortable in the bed at rest. Consider resuming oral lasix in the am. Would not consider her a good candidate for further invasive work up given advanced age, and CKD. Question whether elevated HR may be contributing to decreased EF.   2. Chronic Afib: INR was therapeutic on admission. Has been on Dilt and labetalol for rate control prior to admission. Would stop Dilt, increase labetalol to 280m TID -- TSH normal  -- pharmacy to dose coumadin  3. CKD III: mild rise in Cr with diuresis. Would resume oral lasix tomorrow.  -- follow BMET  4. Pulmonary HTN: On nitrates, unable to tolerate Revatio in the past   5. HTN: stable  6. Anemia: management per primary  Signed, LReino Bellis NP-C Pager [phone]/06/2016, 10:38 AM  Patient examined chart reviewed care discussed with patient and PA. She has chronic afib and some degree of chroic right heart failure. Intolerant to revatio and on nitrates. Rate control has been with both cardizem and labetalol She has been diuresed with some azotemia and feels better at this point Exam with basilar atelectasis mild AS murmur and trace to plus one LE edema bilaterally. Would stop cardizem in setting of new LV dysfunction EF 35% and rate control with higher dose labetalol 200 tid. Continue diuretics and nitrates Would not start ACE given elevated Cr. Continue coumadin which is followed in our office Likely ok to d/c in am Dr VIrish Lackwill see tomorrow  PJenkins Rouge

## 2016-12-26 NOTE — Progress Notes (Addendum)
PROGRESS NOTE    Tonya Farley  ZOX:[id number] DOB: [birth year] DOA: 12/22/2016 PCP: Burton Apley, MD   Outpatient Specialists:    Brief Narrative:  83 year old fairly active and independent female with chronic right heart failure and severe pulmonary hypertension, chronic A. fib, status post 3 kidney disease and hypertension presented with generalized weakness for past 2 weeks which has been progressive. Also noticed increased lower leg swelling   Assessment & Plan:   Principal Problem:   Acute on chronic right heart failure (HCC) Active Problems:   CKD (chronic kidney disease), stage III w/ renal artery stenosis   Chronic a-fib (HCC)   Hypothyroidism   Acute diastolic heart failure (HCC)   Elevated troponin   Acute hypokalemia   HTN (hypertension)   Dyslipidemia   Pulmonary hypertension, moderate to severe (HCC)   Abnormal urinalysis   Acute on chronic diastolic CHF Also has severe pulmonary hypertension and chronic right heart failure. IV Lasix 80 mg every 12 hours x 3 doses then resume home dose - strict I/when daily weight- down 2.2 L per I/Os - Last 2-D echo from 05/2015 -echo shows decreased EF-- cardiology consult  CKD- stage 3 -monitor Cr while in hospital- up with diuresis -daily BMP  Anemia of CD -? From CKD -check stools -Fe panel ok  Elevated troponin Mild, suspect secondary to fluid overload, A. fib. No chest pain symptoms Flat troponins  Hypokalemia Replenish  ? UTI NGTD on culture D/c abx   A. fib Resume home meds -watch HR when patient out of bed- upper limits of normal but not >100 -close cardiology follow up  Elevated INR -from cipro -coumadin per pharmacy     DVT prophylaxis:  Fully anticoagulated   Code Status: DNR   Family Communication: Niece on phone  Disposition Plan:  Home once work up complete- home health PT?  With 24 hour supervision   Consultants:      Subjective: Eating  better   Objective: Vitals:   12/25/16 1239 12/25/16 1935 12/26/16 0411 12/26/16 1243  BP: (!) 104/43 127/61 (!) 149/52 121/63  Pulse: 77 65 76 97  Resp: Temp: 98.4 F (36.9 C) 98 F (36.7 C) 98.3 F (36.8 C) 98.7 F (37.1 C)  TempSrc: Oral Oral Oral Oral  SpO2: 98% 98% 96% 99%  Weight:   59.5 kg (131 lb 3.2 oz)   Height:        Intake/Output Summary (Last 24 hours) at 12/26/16 1632 Last data filed at 12/26/16 1356  Gross per 24 hour  Intake              483 ml  Output              500 ml  Net              -17 ml   Filed Weights   12/24/16 0356 12/25/16 0643 12/26/16 0411  Weight: 58.6 kg (129 lb 3 oz) 59.2 kg (130 lb 9.6 oz) 59.5 kg (131 lb 3.2 oz)    Examination:  General exam: NAD- elderly- stated age Respiratory system: moving air, no crackles Cardiovascular system: irr Gastrointestinal system: +BS, soft Central nervous system: alert- pleasant and cooperative Extremities: no edema Skin: no rash     Data Reviewed: I have personally reviewed following labs and imaging studies  CBC:  Recent Labs Lab 12/22/16 1153 12/24/16 0555 12/25/16 0345 12/26/16 1014  WBC 9.4 13.4* 12.4* 12.2*  NEUTROABS 7.8*  --   --   --  HGB 12.2 10.1* 9.5* 10.9*  HCT 39.3 32.6* 31.1* 35.2*  MCV 82.7 83.4 83.4 82.8  PLT 214 247 227 250   Basic Metabolic Panel:  Recent Labs Lab 12/22/16 1153 12/23/16 0534 12/24/16 0555 12/25/16 0345 12/26/16 1014  NA 140 140 138 138 138  K 3.2* 3.9 5.2* 4.3 3.5  CL 103 101 103 101 99*  CO2 GLUCOSE 116* 115* 116* 122* 185*  BUN 24* 21* 29* 36* 33*  CREATININE 1.56* 1.37* 1.87* 2.10* 2.01*  CALCIUM 9.4 9.4 9.3 9.3 9.3   GFR: Estimated Creatinine Clearance: 14.5 mL/min (A) (by C-G formula based on SCr of 2.01 mg/dL (H)). Liver Function Tests:  Recent Labs Lab 12/22/16 1153  AST 15  ALT 14  ALKPHOS 59  BILITOT 1.0  PROT 6.8  ALBUMIN 3.3*   No results for input(s): LIPASE, AMYLASE in the last  168 hours. No results for input(s): AMMONIA in the last 168 hours. Coagulation Profile:  Recent Labs Lab 12/22/16 1153 12/23/16 0534 12/24/16 0555 12/25/16 0345 12/26/16 1108  INR 2.62 2.48 3.47 3.29 1.90   Cardiac Enzymes:  Recent Labs Lab 12/22/16 1153 12/23/16 1628  TROPONINI 0.03* 0.03*   BNP (last 3 results) No results for input(s): PROBNP in the last 8760 hours. HbA1C: No results for input(s): HGBA1C in the last 72 hours. CBG: No results for input(s): GLUCAP in the last 168 hours. Lipid Profile: No results for input(s): CHOL, HDL, LDLCALC, TRIG, CHOLHDL, LDLDIRECT in the last 72 hours. Thyroid Function Tests: No results for input(s): TSH, T4TOTAL, FREET4, T3FREE, THYROIDAB in the last 72 hours. Anemia Panel:  Recent Labs  12/26/16 1014  FERRITIN 92  TIBC 314  IRON 41   Urine analysis:    Component Value Date/Time   COLORURINE YELLOW 12/22/2016 1150   APPEARANCEUR CLEAR 12/22/2016 1150   LABSPEC 1.012 12/22/2016 1150   PHURINE 5.0 12/22/2016 1150   GLUCOSEU NEGATIVE 12/22/2016 1150   HGBUR NEGATIVE 12/22/2016 1150   BILIRUBINUR NEGATIVE 12/22/2016 1150   KETONESUR NEGATIVE 12/22/2016 1150   PROTEINUR 30 (A) 12/22/2016 1150   UROBILINOGEN 1.0 01/10/2015 2237   NITRITE NEGATIVE 12/22/2016 1150   LEUKOCYTESUR SMALL (A) 12/22/2016 1150     ) Recent Results (from the past 240 hour(s))  Culture, Urine     Status: None   Collection Time: 12/22/16 11:50 AM  Result Value Ref Range Status   Specimen Description URINE, RANDOM  Final   Special Requests NONE  Final   Culture NO GROWTH  Final   Report Status 12/23/2016 FINAL  Final  Culture, blood (Routine X 2) w Reflex to ID Panel     Status: None (Preliminary result)   Collection Time: 12/22/16  1:55 PM  Result Value Ref Range Status   Specimen Description BLOOD RIGHT HAND  Final   Special Requests   Final    BOTTLES DRAWN AEROBIC ONLY Blood Culture adequate volume   Culture NO GROWTH 4 DAYS  Final    Report Status PENDING  Incomplete  Culture, blood (Routine X 2) w Reflex to ID Panel     Status: None (Preliminary result)   Collection Time: 12/22/16  2:04 PM  Result Value Ref Range Status   Specimen Description BLOOD LEFT HAND  Final   Special Requests   Final    BOTTLES DRAWN AEROBIC ONLY Blood Culture adequate volume   Culture NO GROWTH 4 DAYS  Final   Report Status PENDING  Incomplete  Anti-infectives    Start     Dose/Rate Route Frequency Ordered Stop   12/23/16 1800  ciprofloxacin (CIPRO) tablet 500 mg  Status:  Discontinued     500 mg Oral Daily-1800 12/23/16 1035 12/24/16 1015   12/22/16 1500  ciprofloxacin (CIPRO) IVPB 400 mg  Status:  Discontinued     400 mg 200 mL/hr over 60 Minutes Intravenous Every 24 hours 12/22/16 1354 12/23/16 1035   12/22/16 1400  clindamycin (CLEOCIN) IVPB 300 mg  Status:  Discontinued     300 mg 100 mL/hr over 30 Minutes Intravenous  Once 12/22/16 1306 12/22/16 1354       Radiology Studies: No results found.      Scheduled Meds: . atorvastatin  10 mg Oral Q2000  . brimonidine  1 drop Both Eyes TID  . feeding supplement  1 Container Oral BID BM  . [START ON 12/27/2016] furosemide  80 mg Oral Daily  . isosorbide mononitrate  30 mg Oral Daily  . labetalol  200 mg Oral TID  . latanoprost  1 drop Both Eyes QHS  . levothyroxine  88 mcg Oral QAC breakfast  . pentoxifylline  400 mg Oral TID WC  . potassium chloride SA  20 mEq Oral Daily  . sodium chloride flush  3 mL Intravenous Q12H  . timolol  1 drop Both Eyes q morning - 10a  . warfarin  2 mg Oral ONCE-1800  . Warfarin - Pharmacist Dosing Inpatient   Does not apply q1800   Continuous Infusions: . sodium chloride       LOS: 3 days    Time spent: 25 min    Aimar Shrewsbury U Aunesty Tyson, DO [facility] Pager [phone]  If 7PM-7AM, please contact night-coverage [email or web address] Password [email or web address] 12/26/2016, 4:32 PM

## 2016-12-26 NOTE — Progress Notes (Signed)
Noted Physical Therapy recommendations for HHC; CM talked to patient with her Niece at the bedside; patient requested [facility] for [facility] services; Tim with [facility] called; Tim called the patient's PCP Dr Burton Apley, he does not want any HHC services to be arranged on his patient at the hospital; When the patient makes her post hospital visit he will decide at that time if she needs HHc services. CM informed patient / niece about the inability to arrange [facility] services. Abelino Derrick [facility] [phone]

## 2016-12-26 NOTE — Progress Notes (Signed)
ANTICOAGULATION CONSULT NOTE - Follow-up Consult  Pharmacy Consult for Warfarin Indication: atrial fibrillation  Allergies  Allergen Reactions  . Penicillins Hives and Swelling    Has patient had a PCN reaction causing immediate rash, facial/tongue/throat swelling, SOB or lightheadedness with hypotension: Yes Has patient had a PCN reaction causing severe rash involving mucus membranes or skin necrosis: Yes Has patient had a PCN reaction that required hospitalization No Has patient had a PCN reaction occurring within the last 10 years: No If all of the above answers are "NO", then may proceed with Cephalosporin use.    . Sulfa Antibiotics Anaphylaxis and Swelling  . Fish Allergy Nausea And Vomiting   Patient Measurements: Height: _0  (162.6 cm) Weight: 131 lb 3.2 oz (59.5 kg) (scale c) IBW/kg (Calculated) : 54.7  Vital Signs: Temp: 98.7 F (37.1 C) (10/04 1243) Temp Source: Oral (10/04 1243) BP: 121/63 (10/04 1243) Pulse Rate: 97 (10/04 1243)  Labs:  Recent Labs  12/23/16 1628  12/24/16 0555 12/25/16 0345 12/26/16 1014 12/26/16 1108  HGB  --   < > 10.1* 9.5* 10.9*  --   HCT  --   --  32.6* 31.1* 35.2*  --   PLT  --   --  247 227 250  --   LABPROT  --   --  34.7* 33.2*  --  21.6*  INR  --   --  3.47 3.29  --  1.90  CREATININE  --   --  1.87* 2.10* 2.01*  --   TROPONINI 0.03*  --   --   --   --   --   < > = values in this interval not displayed. Estimated Creatinine Clearance: 14.5 mL/min (A) (by C-G formula based on SCr of 2.01 mg/dL (H)).  Medical History: Past Medical History:  Diagnosis Date  . Chronic a-fib ([place])   . Chronic back pain   . Chronic diastolic (congestive) heart failure (HCC)    a. Echo 3/17: mild LVH, EF 55%, no RWMA, mod AS (mean 10 mmHg, peak 21 mmHg), MAC, mild MR, severe LAE, mod reduced RVSF, mild RAE, mod TR, PASP 65 mmHg  . CKD (chronic kidney disease) stage 3, GFR 30-59 ml/min 09/11/2015  . Hyperlipidemia   . Hypertension   .  Hypothyroidism   . PNA (pneumonia)   . Pulmonary hypertension (HCC)    PASP 65 mmHg at Echo 3/17  . Thyroid disease    Medications:  Home Warfarin regimen: 9m on Wednesdays and 2.551mall other days  *This dose was just lowered on 9/27 from 57m51mhree times a week and 2.5mg857ml other due to an INR of 4.3 (which had come down after an INR 6.22 the week prior for which warfarin was held for 2 days then resuming same regimen [facility]).   Assessment: 89 y52r old female on chronic warfarin for atrial fibrillation. Admit INR 2.62 - therapeutic on current home regimen.   INR supratherapeutic but trending down 3.47>>3.29 after one dose held and Cipro D/C'd - No overt bleeding documented, HgB 10.9  Today's INR slightly below goal range.  No overt bleeding or complications noted.  Goal of Therapy:  INR 2-3 Monitor platelets by anticoagulation protocol: Yes   Plan:  1. Coumadin 2 mg x 1 tonight. 2. Daily PT/INR.  JessUvaldo RisingPS  Clinical Pharmacist Pager [phone]/06/2016 1:41 PM

## 2016-12-27 DIAGNOSIS — I272 Pulmonary hypertension, unspecified: Secondary | ICD-10-CM

## 2016-12-27 DIAGNOSIS — I5022 Chronic systolic (congestive) heart failure: Secondary | ICD-10-CM

## 2016-12-27 DIAGNOSIS — I482 Chronic atrial fibrillation: Secondary | ICD-10-CM

## 2016-12-27 DIAGNOSIS — R0602 Shortness of breath: Secondary | ICD-10-CM

## 2016-12-27 LAB — CULTURE, BLOOD (ROUTINE X 2)
CULTURE: NO GROWTH
Culture: NO GROWTH
Special Requests: ADEQUATE
Special Requests: ADEQUATE

## 2016-12-27 LAB — BASIC METABOLIC PANEL
Anion gap: 7 (ref 5–15)
BUN: 31 mg/dL — AB (ref 6–20)
CHLORIDE: 101 mmol/L (ref 101–111)
CO2: 27 mmol/L (ref 22–32)
CREATININE: 1.93 mg/dL — AB (ref 0.44–1.00)
Calcium: 9.1 mg/dL (ref 8.9–10.3)
GFR, EST AFRICAN AMERICAN: 24 mL/min — AB (ref >60)
GFR, EST NON AFRICAN AMERICAN: 21 mL/min — AB (ref >60)
Glucose, Bld: 119 mg/dL — ABNORMAL HIGH (ref 65–99)
Potassium: 3.7 mmol/L (ref 3.5–5.1)
SODIUM: 135 mmol/L (ref 135–145)

## 2016-12-27 LAB — PROTIME-INR
INR: 1.76
PROTHROMBIN TIME: 20.4 s — AB (ref 11.4–15.2)

## 2016-12-27 MED ORDER — WARFARIN SODIUM 2 MG PO TABS: 2.0000 mg | ORAL_TABLET | Freq: Once | ORAL | Status: DC

## 2016-12-27 MED ORDER — BOOST / RESOURCE BREEZE PO LIQD: 1.0000 | Freq: Two times a day (BID) | ORAL | 0 refills | Status: DC

## 2016-12-27 MED ORDER — LABETALOL HCL 200 MG PO TABS: 200.0000 mg | ORAL_TABLET | Freq: Three times a day (TID) | ORAL | 0 refills | Status: DC

## 2016-12-27 MED ORDER — ISOSORBIDE MONONITRATE ER 30 MG PO TB24: 30.0000 mg | ORAL_TABLET | Freq: Every day | ORAL | 0 refills | Status: DC

## 2016-12-27 NOTE — Progress Notes (Signed)
Pt has orders to be discharged. Discharge instructions given and pt has no additional questions at this time. Medication regimen reviewed and pt educated. Pt verbalized understanding and has no additional questions. Telemetry box removed. IV removed and site in good condition. Pt stable and waiting for transportation. 

## 2016-12-27 NOTE — Discharge Summary (Signed)
PATIENT DETAILS Name: Tonya Farley Age: 83 y.o. Sex: female Date of Birth: 1919-08-30 MRN: [id number]. Admitting Physician: No admitting provider for patient encounter. WUJ:[id number], Windy Fast, MD  Admit Date: 12/22/2016 Discharge date: 12/27/2016  Recommendations for Outpatient Follow-up:  1. Follow up with PCP in 1-2 weeks 2. Please obtain BMP/CBC in one week 3. Please ensure follow up with cardiology   Admitted From:  Home  Disposition: Home with home health services   Home Health:  Yes  Equipment/Devices: None  Discharge Condition: Stable  CODE STATUS:  DNR  Diet recommendation:  Heart Healthy   Brief Summary: See H&P, Labs, Consult and Test reports for all details in brief, patient is a 83 year old female who has a long-standing history of severe pulmonary hypertension, chronic A. fib on anticoagulation-admitted with decompensated heart failure. Found to have new onset systolic heart failure. See below for further details  Brief Hospital Course: Acute systolic heart failure: She presented with weakness and lower extremity edema, echo showed reduced EF of around 35% with diffuse hypokinesis. She was started on diuretics, and rapidly improved. By day of discharge volume status is stable, she has been transitioned to oral diuretics. Spoke with her primary cardiologist today (Dr Michiel Sites to discharge today. Diltiazem has been stopped, dosing of labetalol has been increased to 3 times a day.  Chronic atrial fibrillation: Rate controlled-Cardizem has been discontinued due to finding of new onset systolic heart failure, labetalol has been increased to 3 times a day. Continue Coumadin-follow-up with Coumadin clinic/PCP for INR checks.  Chronic kidney stage III: Creatinine close to usual baseline-continue to follow periodically in the outpatient setting  Pulmonary hypertension: Stable-unable to tolerate the pressure in the past  Hypertension: Stable-continue to  optimize in the outpatient setting  Normocytic anemia: Appears to be chronic-stable for follow-up in the outpatient setting. Probably related to underlying chronic kidney disease.  Procedures/Studies: Echo 10/3>> - The patient was in atrial fibrillation. Normal LV size with mild   LV hypertrophy. EF 35% with diffuse hypokinesis. Mildly dilated   RV with mildly decreased systolic function. Aortic valve   sclerosis without significant stenosis. Moderate to severe   tricuspid regurgitation. Moderate to severe pulmonary   hypertension  Discharge Diagnoses:  Principal Problem:   Acute on chronic right heart failure (HCC) Active Problems:   CKD (chronic kidney disease), stage III w/ renal artery stenosis   Chronic a-fib (HCC)   Hypothyroidism   Acute diastolic heart failure (HCC)   Elevated troponin   Acute hypokalemia   HTN (hypertension)   Dyslipidemia   Pulmonary hypertension, moderate to severe (HCC)   Abnormal urinalysis   Discharge Instructions:  Activity:  As tolerated with Full fall precautions use walker/cane & assistance as needed  Discharge Instructions    (HEART FAILURE PATIENTS) Call MD:  Anytime you have any of the following symptoms: 1) 3 pound weight gain in 24 hours or 5 pounds in 1 week 2) shortness of breath, with or without a dry hacking cough 3) swelling in the hands, feet or stomach 4) if you have to sleep on extra pillows at night in order to breathe.    Complete by:  As directed    Diet - low sodium heart healthy    Complete by:  As directed    Increase activity slowly    Complete by:  As directed      Allergies as of 12/27/2016      Reactions   Penicillins Hives, Swelling   Has patient had  a PCN reaction causing immediate rash, facial/tongue/throat swelling, SOB or lightheadedness with hypotension: Yes Has patient had a PCN reaction causing severe rash involving mucus membranes or skin necrosis: Yes Has patient had a PCN reaction that required  hospitalization No Has patient had a PCN reaction occurring within the last 10 years: No If all of the above answers are "NO", then may proceed with Cephalosporin use.   Sulfa Antibiotics Anaphylaxis, Swelling   Fish Allergy Nausea And Vomiting      Medication List    STOP taking these medications   diltiazem 360 MG 24 hr capsule Commonly known as:  CARDIZEM CD     TAKE these medications   albuterol (5 MG/ML) 0.5% nebulizer solution Commonly known as:  PROVENTIL Take 0.5 mLs (2.5 mg total) by nebulization every 6 (six) hours as needed for wheezing or shortness of breath.   atorvastatin 10 MG tablet Commonly known as:  LIPITOR Take 10 mg by mouth daily.   B COMPLEX 1 PO Take 1 tablet by mouth daily.   brimonidine 0.15 % ophthalmic solution Commonly known as:  ALPHAGAN Place 1 drop into both eyes 3 (three) times daily.   cholecalciferol 400 units Tabs tablet Commonly known as:  VITAMIN D Take 400 Units by mouth daily.   feeding supplement Liqd Take 1 Container by mouth 2 (two) times daily between meals.   furosemide 80 MG tablet Commonly known as:  LASIX Take 1 tablet (80 mg total) by mouth daily.   isosorbide mononitrate 30 MG 24 hr tablet Commonly known as:  IMDUR Take 1 tablet (30 mg total) by mouth daily.   labetalol 200 MG tablet Commonly known as:  NORMODYNE Take 1 tablet (200 mg total) by mouth 3 (three) times daily. TAKE 1 TABLET(200 MG) BY MOUTH TWICE DAILY What changed:  how much to take  how to take this  when to take this   latanoprost 0.005 % ophthalmic solution Commonly known as:  XALATAN Place 1 drop into both eyes at bedtime.   levothyroxine 88 MCG tablet Commonly known as:  SYNTHROID, LEVOTHROID Take 88 mcg by mouth daily before breakfast.   metolazone 2.5 MG tablet Commonly known as:  ZAROXOLYN Take 1 tablet (2.5 mg total) by mouth as needed (take if weight goes up 3 lbs in 1 day or 5 lbs in 1 week). What changed:  reasons to take  this   ondansetron 4 MG disintegrating tablet Commonly known as:  ZOFRAN-ODT Take 1 tablet (4 mg total) by mouth every 8 (eight) hours as needed for nausea or vomiting.   pentoxifylline 400 MG CR tablet Commonly known as:  TRENTAL Take 400 mg by mouth 3 (three) times daily with meals.   potassium chloride SA 20 MEQ tablet Commonly known as:  K-DUR,KLOR-CON Take 20 mEq by mouth daily.   SYSTANE OP Apply 2 drops to eye daily as needed (dry eyes).   timolol 0.5 % ophthalmic solution Commonly known as:  TIMOPTIC Place 1 drop into both eyes every morning.   warfarin 1 MG tablet Commonly known as:  COUMADIN TAKE 2.5 OR 3 TABLETS AS DIRECTED BY COUMADIN CLINIC What changed:  See the new instructions.      Follow-up Information    [facility] Follow up.   Specialty:  Home Health Services Why:  They will do your home health care at your home Contact information: [address] [place] [address] [place] [address] [phone]        Burton Apley,  MD. Schedule an appointment as soon as possible for a visit in 1 week(s).   Specialty:  Internal Medicine Contact information: [address] [place] [address] [phone]        Corky Crafts, MD. Schedule an appointment as soon as possible for a visit in 2 week(s).   Specialties:  Cardiology, Radiology, Interventional Cardiology Contact information: [address] [place] [address] [phone]          Allergies  Allergen Reactions  . Penicillins Hives and Swelling    Has patient had a PCN reaction causing immediate rash, facial/tongue/throat swelling, SOB or lightheadedness with hypotension: Yes Has patient had a PCN reaction causing severe rash involving mucus membranes or skin necrosis: Yes Has patient had a PCN reaction that required hospitalization No Has patient had a PCN reaction occurring within the last 10 years: No If all of the above answers are "NO", then may  proceed with Cephalosporin use.    . Sulfa Antibiotics Anaphylaxis and Swelling  . Fish Allergy Nausea And Vomiting    Consultations:   cardiology  Other Procedures/Studies: Dg Chest 2 View  Result Date: 12/22/2016 CLINICAL DATA:  Generalized weakness and dizziness for 4 days. EXAM: CHEST  2 VIEW COMPARISON:  08/28/2016 and prior radiographs FINDINGS: Cardiomegaly again identified. Left basilar atelectasis/ scarring again noted. There is no evidence of focal airspace disease, pulmonary edema, suspicious pulmonary nodule/mass, pleural effusion, or pneumothorax. No acute bony abnormalities are identified. Lower thoracic/upper lumbar compression fractures and vertebral augmentation changes again noted. IMPRESSION: Cardiomegaly without evidence of acute cardiopulmonary disease. Electronically Signed   By: Harmon Pier M.D.   On: 12/22/2016 10:50   Ct Head Wo Contrast  Result Date: 12/22/2016 CLINICAL DATA:  83 year old female with generalized weakness and dizziness for 4 days. EXAM: CT HEAD WITHOUT CONTRAST TECHNIQUE: Contiguous axial images were obtained from the base of the skull through the vertex without intravenous contrast. COMPARISON:  04/12/2008 FINDINGS: Brain: No evidence of acute infarction, hemorrhage, hydrocephalus, extra-axial collection or mass lesion/mass effect. Atrophy and mild chronic small-vessel white matter ischemic changes again noted Vascular: Atherosclerotic calcifications again noted. Skull: Normal. Negative for fracture or focal lesion. Sinuses/Orbits: No acute finding. Other: None IMPRESSION: 1. No evidence of acute intracranial abnormality 2. Atrophy and mild chronic small-vessel white matter ischemic changes. Electronically Signed   By: Harmon Pier M.D.   On: 12/22/2016 10:53     TODAY-DAY OF DISCHARGE:  Subjective:   Tonya Farley today has no headache,no chest abdominal pain,no new weakness tingling or numbness, feels much better wants to go home  today.  Objective:   Blood pressure (!) 110/51, pulse 74, temperature (!) 97.5 F (36.4 C), temperature source Oral, resp. rate 18, height  (1.626 m), weight 59.1 kg (130 lb 3.2 oz), SpO2 97 %.  Intake/Output Summary (Last 24 hours) at 12/27/16 1123 Last data filed at 12/27/16 0956  Gross per 24 hour  Intake              363 ml  Output              775 ml  Net             -412 ml   Filed Weights   12/25/16 0643 12/26/16 0411 12/27/16 0558  Weight: 59.2 kg (130 lb 9.6 oz) 59.5 kg (131 lb 3.2 oz) 59.1 kg (130 lb 3.2 oz)    Exam: Awake Alert, Oriented *3, No new F.N deficits, Normal  affect [place].AT,PERRAL Supple Neck,No JVD, No cervical lymphadenopathy appriciated.  Symmetrical Chest wall movement, Good air movement bilaterally, CTAB RRR,No Gallops,Rubs or new Murmurs, No Parasternal Heave +ve B.Sounds, Abd Soft, Non tender, No organomegaly appriciated, No rebound -guarding or rigidity. No Cyanosis, Clubbing or edema, No new Rash or bruise   PERTINENT RADIOLOGIC STUDIES: Dg Chest 2 View  Result Date: 12/22/2016 CLINICAL DATA:  Generalized weakness and dizziness for 4 days. EXAM: CHEST  2 VIEW COMPARISON:  08/28/2016 and prior radiographs FINDINGS: Cardiomegaly again identified. Left basilar atelectasis/ scarring again noted. There is no evidence of focal airspace disease, pulmonary edema, suspicious pulmonary nodule/mass, pleural effusion, or pneumothorax. No acute bony abnormalities are identified. Lower thoracic/upper lumbar compression fractures and vertebral augmentation changes again noted. IMPRESSION: Cardiomegaly without evidence of acute cardiopulmonary disease. Electronically Signed   By: Harmon Pier M.D.   On: 12/22/2016 10:50   Ct Head Wo Contrast  Result Date: 12/22/2016 CLINICAL DATA:  83 year old female with generalized weakness and dizziness for 4 days. EXAM: CT HEAD WITHOUT CONTRAST TECHNIQUE: Contiguous axial images were obtained from the base of the skull through  the vertex without intravenous contrast. COMPARISON:  04/12/2008 FINDINGS: Brain: No evidence of acute infarction, hemorrhage, hydrocephalus, extra-axial collection or mass lesion/mass effect. Atrophy and mild chronic small-vessel white matter ischemic changes again noted Vascular: Atherosclerotic calcifications again noted. Skull: Normal. Negative for fracture or focal lesion. Sinuses/Orbits: No acute finding. Other: None IMPRESSION: 1. No evidence of acute intracranial abnormality 2. Atrophy and mild chronic small-vessel white matter ischemic changes. Electronically Signed   By: Harmon Pier M.D.   On: 12/22/2016 10:53     PERTINENT LAB RESULTS: CBC:  Recent Labs  12/25/16 0345 12/26/16 1014  WBC 12.4* 12.2*  HGB 9.5* 10.9*  HCT 31.1* 35.2*  PLT 227 250   CMET CMP     Component Value Date/Time   NA 135 12/27/2016 0502   K 3.7 12/27/2016 0502   CL 101 12/27/2016 0502   CO2 27 12/27/2016 0502   GLUCOSE 119 (H) 12/27/2016 0502   BUN 31 (H) 12/27/2016 0502   CREATININE 1.93 (H) 12/27/2016 0502   CREATININE 1.74 (H) 11/28/2015 1535   CALCIUM 9.1 12/27/2016 0502   PROT 6.8 12/22/2016 1153   ALBUMIN 3.3 (L) 12/22/2016 1153   AST 15 12/22/2016 1153   ALT 14 12/22/2016 1153   ALKPHOS 59 12/22/2016 1153   BILITOT 1.0 12/22/2016 1153   GFRNONAA 21 (L) 12/27/2016 0502   GFRAA 24 (L) 12/27/2016 0502    GFR Estimated Creatinine Clearance: 15.1 mL/min (A) (by C-G formula based on SCr of 1.93 mg/dL (H)). No results for input(s): LIPASE, AMYLASE in the last 72 hours. No results for input(s): CKTOTAL, CKMB, CKMBINDEX, TROPONINI in the last 72 hours. Invalid input(s): POCBNP No results for input(s): DDIMER in the last 72 hours. No results for input(s): HGBA1C in the last 72 hours. No results for input(s): CHOL, HDL, LDLCALC, TRIG, CHOLHDL, LDLDIRECT in the last 72 hours. No results for input(s): TSH, T4TOTAL, T3FREE, THYROIDAB in the last 72 hours.  Invalid input(s): FREET3  Recent  Labs  12/26/16 1014  FERRITIN 92  TIBC 314  IRON 41   Coags:  Recent Labs  12/26/16 1108 12/27/16 0502  INR 1.90 1.76   Microbiology: Recent Results (from the past 240 hour(s))  Culture, Urine     Status: None   Collection Time: 12/22/16 11:50 AM  Result Value Ref Range Status   Specimen Description URINE, RANDOM  Final  Special Requests NONE  Final   Culture NO GROWTH  Final   Report Status 12/23/2016 FINAL  Final  Culture, blood (Routine X 2) w Reflex to ID Panel     Status: None (Preliminary result)   Collection Time: 12/22/16  1:55 PM  Result Value Ref Range Status   Specimen Description BLOOD RIGHT HAND  Final   Special Requests   Final    BOTTLES DRAWN AEROBIC ONLY Blood Culture adequate volume   Culture NO GROWTH 4 DAYS  Final   Report Status PENDING  Incomplete  Culture, blood (Routine X 2) w Reflex to ID Panel     Status: None (Preliminary result)   Collection Time: 12/22/16  2:04 PM  Result Value Ref Range Status   Specimen Description BLOOD LEFT HAND  Final   Special Requests   Final    BOTTLES DRAWN AEROBIC ONLY Blood Culture adequate volume   Culture NO GROWTH 4 DAYS  Final   Report Status PENDING  Incomplete    FURTHER DISCHARGE INSTRUCTIONS:  Get Medicines reviewed and adjusted: Please take all your medications with you for your next visit with your Primary MD  Laboratory/radiological data: Please request your Primary MD to go over all hospital tests and procedure/radiological results at the follow up, please ask your Primary MD to get all Hospital records sent to his/her office.  In some cases, they will be blood work, cultures and biopsy results pending at the time of your discharge. Please request that your primary care M.D. goes through all the records of your hospital data and follows up on these results.  Also Note the following: If you experience worsening of your admission symptoms, develop shortness of breath, life threatening emergency,  suicidal or homicidal thoughts you must seek medical attention immediately by calling 911 or calling your MD immediately  if symptoms less severe.  You must read complete instructions/literature along with all the possible adverse reactions/side effects for all the Medicines you take and that have been prescribed to you. Take any new Medicines after you have completely understood and accpet all the possible adverse reactions/side effects.   Do not drive when taking Pain medications or sleeping medications (Benzodaizepines)  Do not take more than prescribed Pain, Sleep and Anxiety Medications. It is not advisable to combine anxiety,sleep and pain medications without talking with your primary care practitioner  Special Instructions: If you have smoked or chewed Tobacco  in the last 2 yrs please stop smoking, stop any regular Alcohol  and or any Recreational drug use.  Wear Seat belts while driving.  Please note: You were cared for by a hospitalist during your hospital stay. Once you are discharged, your primary care physician will handle any further medical issues. Please note that NO REFILLS for any discharge medications will be authorized once you are discharged, as it is imperative that you return to your primary care physician (or establish a relationship with a primary care physician if you do not have one) for your post hospital discharge needs so that they can reassess your need for medications and monitor your lab values.  Total Time spent coordinating discharge including counseling, education and face to face time equals  45 minutes.  SignedJeoffrey Massed 12/27/2016 11:23 AM

## 2016-12-27 NOTE — Discharge Instructions (Signed)

## 2016-12-27 NOTE — Progress Notes (Addendum)
Patient and her family ( Niece) really wants her to have HHC services at discharge; Attending MD is agreeable to sign the paperwork for [facility] services. Patient / Niece made aware. [facility] for referral. Also family is arranging for a caregiver at home - which will pick up the patient at discharge; Alexis Goodell [phone]

## 2016-12-27 NOTE — Care Management Important Message (Signed)
Important Message  Patient Details  Name: Tonya Farley MRN: [id number] Date of Birth: 11-03-1919   Medicare Important Message Given:  Yes    Dhruvan Gullion 12/27/2016, 11:40 AM

## 2016-12-27 NOTE — Progress Notes (Signed)
Progress Note  Patient Name: Tonya Farley Date of Encounter: 12/27/2016  Primary Cardiologist: Eldridge Dace  Subjective   Feels well. No palpitations.  Inpatient Medications    Scheduled Meds: . atorvastatin  10 mg Oral Q2000  . brimonidine  1 drop Both Eyes TID  . feeding supplement  1 Container Oral BID BM  . furosemide  80 mg Oral Daily  . isosorbide mononitrate  30 mg Oral Daily  . labetalol  200 mg Oral TID  . latanoprost  1 drop Both Eyes QHS  . levothyroxine  88 mcg Oral QAC breakfast  . pentoxifylline  400 mg Oral TID WC  . potassium chloride SA  20 mEq Oral Daily  . sodium chloride flush  3 mL Intravenous Q12H  . timolol  1 drop Both Eyes q morning - 10a  . warfarin  2 mg Oral ONCE-1800  . Warfarin - Pharmacist Dosing Inpatient   Does not apply q1800   Continuous Infusions: . sodium chloride     PRN Meds: sodium chloride, acetaminophen, albuterol, metoprolol tartrate, ondansetron (ZOFRAN) IV, polyvinyl alcohol, sodium chloride flush   Vital Signs    Vitals:   12/26/16 1951 12/26/16 2301 12/27/16 0558 12/27/16 0826  BP: 111/78 115/65 (!) 129/56 (!) 110/51  Pulse: 62 79 64 74  Resp: 18  18   Temp: 97.8 F (36.6 C)  97.6 F (36.4 C) (!) 97.5 F (36.4 C)  TempSrc: Oral  Oral Oral  SpO2: 97%  100% 97%  Weight:   130 lb 3.2 oz (59.1 kg)   Height:        Intake/Output Summary (Last 24 hours) at 12/27/16 1122 Last data filed at 12/27/16 0956  Gross per 24 hour  Intake              363 ml  Output              775 ml  Net             -412 ml   Filed Weights   12/25/16 0643 12/26/16 0411 12/27/16 0558  Weight: 130 lb 9.6 oz (59.2 kg) 131 lb 3.2 oz (59.5 kg) 130 lb 3.2 oz (59.1 kg)    Telemetry    AFib, rate control - Personally Reviewed  ECG    No recent  Physical Exam   GEN: No acute distress.   Neck: No JVD Cardiac: irregualrly irregular, no murmurs, rubs, or gallops.  Respiratory: Clear to auscultation bilaterally. GI: Soft,  nontender, non-distended  MS: No LE edema; varicose veins Neuro:  Nonfocal  Psych: Normal affect   Labs    Chemistry Recent Labs Lab 12/22/16 1153  12/25/16 0345 12/26/16 1014 12/27/16 0502  NA 140  < > 138 138 135  K 3.2*  < > 4.3 3.5 3.7  CL 103  < > 101 99* 101  CO2 29  < > GLUCOSE 116*  < > 122* 185* 119*  BUN 24*  < > 36* 33* 31*  CREATININE 1.56*  < > 2.10* 2.01* 1.93*  CALCIUM 9.4  < > 9.3 9.3 9.1  PROT 6.8  --   --   --   --   ALBUMIN 3.3*  --   --   --   --   AST 15  --   --   --   --   ALT 14  --   --   --   --   ALKPHOS 59  --   --   --   --  BILITOT 1.0  --   --   --   --   GFRNONAA 27*  < > 19* 20* 21*  GFRAA 31*  < > 22* 23* 24*  ANIONGAP 8  < > < > = values in this interval not displayed.   Hematology Recent Labs Lab 12/24/16 0555 12/25/16 0345 12/26/16 1014  WBC 13.4* 12.4* 12.2*  RBC 3.91 3.73* 4.25  HGB 10.1* 9.5* 10.9*  HCT 32.6* 31.1* 35.2*  MCV 83.4 83.4 82.8  MCH 25.8* 25.5* 25.6*  MCHC 31.0 30.5 31.0  RDW 17.9* 18.1* 17.6*  PLT 247 227 250    Cardiac Enzymes Recent Labs Lab 12/22/16 1153 12/23/16 1628  TROPONINI 0.03* 0.03*   No results for input(s): TROPIPOC in the last 168 hours.   BNP Recent Labs Lab 12/22/16 1153  BNP 851.0*     DDimer No results for input(s): DDIMER in the last 168 hours.   Radiology    No results found.  Cardiac Studies   EF 35%  Patient Profile     83 y.o. female with AFib, pulmonary HTN, severe TR  Assessment & Plan    1) AFib: COntinue rate control.  WIll have COUmadin checked with Home Health.  2_ SHOB: Improved.  Appears euvolemic.  CONtinue diuretics.   3) Chronic systolic heart failure: Continue beta blocker and nitrates.  Not a candidate for ACE-I due to chronic renal insufficiency.  For questions or updates, please contact [facility] Please consult [email or web address] for contact info under Cardiology/STEMI.      Signed, Lance Muss, MD    12/27/2016, 11:22 AM

## 2016-12-27 NOTE — Progress Notes (Signed)
ANTICOAGULATION CONSULT NOTE - Follow-up Consult  Pharmacy Consult for Warfarin Indication: atrial fibrillation  Allergies  Allergen Reactions  . Penicillins Hives and Swelling    Has patient had a PCN reaction causing immediate rash, facial/tongue/throat swelling, SOB or lightheadedness with hypotension: Yes Has patient had a PCN reaction causing severe rash involving mucus membranes or skin necrosis: Yes Has patient had a PCN reaction that required hospitalization No Has patient had a PCN reaction occurring within the last 10 years: No If all of the above answers are "NO", then may proceed with Cephalosporin use.    . Sulfa Antibiotics Anaphylaxis and Swelling  . Fish Allergy Nausea And Vomiting   Patient Measurements: Height:  (162.6 cm) Weight: 130 lb 3.2 oz (59.1 kg) (scale c) IBW/kg (Calculated) : 54.7  Vital Signs: Temp: 97.5 F (36.4 C) (10/05 0826) Temp Source: Oral (10/05 0826) BP: 110/51 (10/05 0826) Pulse Rate: 74 (10/05 0826)  Labs:  Recent Labs  12/25/16 0345 12/26/16 1014 12/26/16 1108 12/27/16 0502  HGB 9.5* 10.9*  --   --   HCT 31.1* 35.2*  --   --   PLT 227 250  --   --   LABPROT 33.2*  --  21.6* 20.4*  INR 3.29  --  1.90 1.76  CREATININE 2.10* 2.01*  --  1.93*   Estimated Creatinine Clearance: 15.1 mL/min (A) (by C-G formula based on SCr of 1.93 mg/dL (H)).   Medications:  Home Warfarin regimen:  on Wednesdays and 2.5mg  all other days  *This dose was lowered on 9/27 from  three times a week and 2.5mg  all other due to an INR of 4.3 (which had come down after an INR 6.22 the week prior for which warfarin was held for 2 days then resuming same regimen per Anticoag Clinic notes).   Assessment: 83 year old female on chronic warfarin for atrial fibrillation.  INR was elevated and warfarin was held for 2 days during admission. Now INR has trended down to 1.76 after warfarin was restarted last evening.   Hgb 10.9, plts 250- no bleeding  noted  Goal of Therapy:  INR 2-3 Monitor platelets by anticoagulation protocol: Yes   Plan:  -Warfarin 2 mg x 1 tonight. -If patient discharges today, recommend warfarin  daily with an INR check on Monday 10/8 or Tuesday 10/9 -Daily PT/INR while here. CBC in AM  Tonya Farley, PharmD, BCPS Clinical Pharmacist Pager: [phone] Clinical Phone for 12/27/2016 until 3:30pm: [phone] If after 3:30pm, please call main pharmacy at [phone] 12/27/2016 9:56 AM

## 2016-12-30 ENCOUNTER — Telehealth: Payer: Self-pay | Admitting: Interventional Cardiology

## 2016-12-30 NOTE — Telephone Encounter (Signed)
Vinnie Langton from [facility] calling to get verbal orders for [facility]. She is wanting to do 3 times a week for 1 week and then 2 times a week for 5 weeks. Dr. Eldridge Dace has already agreed to approve patient's HH. Vinnie Langton will fax over orders for signature.

## 2016-12-30 NOTE — Telephone Encounter (Signed)
New message   Tonya Farley from [facility] calling for order for home care. Please Call  Gretchen at [phone].

## 2016-12-31 ENCOUNTER — Ambulatory Visit (INDEPENDENT_AMBULATORY_CARE_PROVIDER_SITE_OTHER): Payer: Medicare Other | Admitting: Interventional Cardiology

## 2016-12-31 DIAGNOSIS — Z5181 Encounter for therapeutic drug level monitoring: Secondary | ICD-10-CM

## 2016-12-31 DIAGNOSIS — I480 Paroxysmal atrial fibrillation: Secondary | ICD-10-CM

## 2016-12-31 LAB — POCT INR: INR: 1.9

## 2017-01-01 ENCOUNTER — Ambulatory Visit (INDEPENDENT_AMBULATORY_CARE_PROVIDER_SITE_OTHER): Payer: Medicare Other | Admitting: Nurse Practitioner

## 2017-01-01 ENCOUNTER — Encounter: Payer: Self-pay | Admitting: Nurse Practitioner

## 2017-01-01 VITALS — BP 150/90 | HR 145 | Ht 64.0 in | Wt 132.8 lb

## 2017-01-01 DIAGNOSIS — I4819 Other persistent atrial fibrillation: Secondary | ICD-10-CM

## 2017-01-01 DIAGNOSIS — I481 Persistent atrial fibrillation: Secondary | ICD-10-CM | POA: Diagnosis not present

## 2017-01-01 DIAGNOSIS — I5041 Acute combined systolic (congestive) and diastolic (congestive) heart failure: Secondary | ICD-10-CM | POA: Diagnosis not present

## 2017-01-01 DIAGNOSIS — Z7901 Long term (current) use of anticoagulants: Secondary | ICD-10-CM | POA: Diagnosis not present

## 2017-01-01 MED ORDER — DILTIAZEM HCL ER COATED BEADS 180 MG PO CP24: 180.0000 mg | ORAL_CAPSULE | Freq: Every day | ORAL | 3 refills | Status: DC

## 2017-01-01 NOTE — Patient Instructions (Addendum)
We will be checking the following labs today - NONE   Medication Instructions:    Continue with your current medicines. BUT  I am adding back Diltiazem 180 mg a day - start back today. This is at your drug store.     Testing/Procedures To Be Arranged:  N/A  Follow-Up:   See Dr. Eldridge Dace in about 2 to 3 months.   Will let the Home Health RN/PT staff check your heart rate - we may need to adjust your dose up.     Other Special Instructions:   N/A    If you need a refill on your cardiac medications before your next appointment, please call your pharmacy.   Call the [facility] office at [phone] if you have any questions, problems or concerns.

## 2017-01-01 NOTE — Progress Notes (Signed)
CARDIOLOGY OFFICE NOTE  Date:  01/01/2017    Tonya Farley Date of Birth: 07-12-1919 Medical Record #[id number]  PCP:  Tonya Dy, MD  Cardiologist:  Tonya Farley    Chief Complaint  Patient presents with  . Congestive Heart Failure    Post hospital visit - seen for Dr. Irish Farley    History of Present Illness: Tonya Farley is a 83 y.o. female who presents today for a post hospital visit. Seen for Dr. Irish Farley.   She has a history of chronic AFib, diastolic HF, CKD III, HTN, hypothyroidism, pulmonary HTN. She was admitted last year back in March then again in May with dyspnea in the setting of PNA and AFib RVR with pleural effusions that required thoracentesis. Echo back in 3/17 showed some RV failure and PASP 44mHg. Given her pulmonary HTN she was started on Revatio 215mTID, but had to stop this medication 2/2 to cost and side effects.   She was last seen in the office on 2/18 by Dr. VaIrish LackReported fatigue and dyspnea on exertion, but overall stable. Lasix dose at that time was 8059mn am and 76m59m pm. EKG showed Afib with rate controlled. On coumadin for OAC[place] onset of weakness in the setting of progressive swelling in her legs. Remains in persistent AF - rate was ok. BNP 851. Echo with new finding of systolic HF with EF of 35% 51%h diffuse hypokinesis, moderate/sever TR and pulmonary hypertension. Seen by cardiology - CCB was stopped in light of her low EF. But noted concern that HR may be affecting her EF. Labetalol was increased to TID. Admission EKG with HR of 95 noted. No other testing felt to be needed. She is noted to be a DNR.  Comes in today. Here with her niece. Still lives alone. Her niece lives about 1/2 hour a way. Still driving to church. Says she "does what she wants".  Asking why her Diltiazem was taken away. Says she does not feel good - feels "worn out but better than when I was in the hospital". Seems  to have "more pep" since she got home. No chest pain. Says her breathing is ok - does not feel short of breath. No real swelling now. She does not use salt. Had someone staying with her until just a few days ago - now back on her own. She does not notice any palpitations.   Past Medical History:  Diagnosis Date  . Chronic a-fib (HCC)[place]. Chronic back pain   . Chronic diastolic (congestive) heart failure (HCC)    a. Echo 3/17: mild LVH, EF 55%, no RWMA, mod AS (mean 10 mmHg, peak 21 mmHg), MAC, mild MR, severe LAE, mod reduced RVSF, mild RAE, mod TR, PASP 65 mmHg  . CKD (chronic kidney disease) stage 3, GFR 30-59 ml/min (HCC) 09/11/2015  . Hyperlipidemia   . Hypertension   . Hypothyroidism   . PNA (pneumonia)   . Pulmonary hypertension (HCC)    PASP 65 mmHg at Echo 3/17  . Thyroid disease     Past Surgical History:  Procedure Laterality Date  . APPENDECTOMY    . cataracts     bilateral  . EYE SURGERY     left eye "hole"  . TONSILLECTOMY       Medications: Current Meds  Medication Sig  . albuterol (PROVENTIL) (5 MG/ML) 0.5% nebulizer solution Take 0.5 mLs (2.5 mg total) by nebulization every  6 (six) hours as needed for wheezing or shortness of breath.  Marland Kitchen atorvastatin (LIPITOR) 10 MG tablet Take 10 mg by mouth daily.  . B Complex Vitamins (B COMPLEX 1 PO) Take 1 tablet by mouth daily.  . brimonidine (ALPHAGAN) 0.15 % ophthalmic solution Place 1 drop into both eyes 3 (three) times daily.  . cholecalciferol (VITAMIN D) 400 units TABS tablet Take 400 Units by mouth daily.  . feeding supplement (BOOST / RESOURCE BREEZE) LIQD Take 1 Container by mouth 2 (two) times daily between meals.  . furosemide (LASIX) 80 MG tablet Take 1 tablet (80 mg total) by mouth daily.  . isosorbide mononitrate (IMDUR) 30 MG 24 hr tablet Take 1 tablet (30 mg total) by mouth daily.  Marland Kitchen labetalol (NORMODYNE) 200 MG tablet Take 1 tablet (200 mg total) by mouth 3 (three) times daily. TAKE 1 TABLET(200 MG) BY  MOUTH TWICE DAILY  . latanoprost (XALATAN) 0.005 % ophthalmic solution Place 1 drop into both eyes at bedtime.  Marland Kitchen levothyroxine (SYNTHROID, LEVOTHROID) 88 MCG tablet Take 88 mcg by mouth daily before breakfast.  . metolazone (ZAROXOLYN) 2.5 MG tablet Take 1 tablet (2.5 mg total) by mouth as needed (take if weight goes up 3 lbs in 1 day or 5 lbs in 1 week). (Patient taking differently: Take 2.5 mg by mouth as needed (take if weight goes up 3 lbs in 1 day or 5 lbs in 1 week or fluid). )  . ondansetron (ZOFRAN-ODT) 4 MG disintegrating tablet Take 1 tablet (4 mg total) by mouth every 8 (eight) hours as needed for nausea or vomiting.  . pentoxifylline (TRENTAL) 400 MG CR tablet Take 400 mg by mouth 3 (three) times daily with meals.  Tonya Farley Glycol-Propyl Glycol (SYSTANE OP) Apply 2 drops to eye daily as needed (dry eyes).   . potassium chloride SA (K-DUR,KLOR-CON) 20 MEQ tablet Take 20 mEq by mouth daily.  . timolol (TIMOPTIC) 0.5 % ophthalmic solution Place 1 drop into both eyes every morning.   . warfarin (COUMADIN) 1 MG tablet TAKE 2.5 OR 3 TABLETS AS DIRECTED BY COUMADIN CLINIC (Patient taking differently: TAKE 2.5  TABLETS AS DIRECTED BY COUMADIN CLINIC)     Allergies: Allergies  Allergen Reactions  . Penicillins Hives and Swelling    Has patient had a PCN reaction causing immediate rash, facial/tongue/throat swelling, SOB or lightheadedness with hypotension: Yes Has patient had a PCN reaction causing severe rash involving mucus membranes or skin necrosis: Yes Has patient had a PCN reaction that required hospitalization No Has patient had a PCN reaction occurring within the last 10 years: No If all of the above answers are "NO", then may proceed with Cephalosporin use.    . Sulfa Antibiotics Anaphylaxis and Swelling  . Fish Allergy Nausea And Vomiting    Social History: The patient  reports that she has never smoked. She has never used smokeless tobacco. She reports that she does not  drink alcohol or use drugs.   Family History: The patient's family history includes Cancer in her mother; Heart attack in her father; Heart disease in her sister and sister; Other in her father.   Review of Systems: Please see the history of present illness.   Otherwise, the review of systems is positive for none.   All other systems are reviewed and negative.   Physical Exam: VS:  BP (!) 150/90 (BP Location: Left Arm, Patient Position: Sitting, Cuff Size: Normal)   Pulse (!) 145   Ht 5' 4"  (1.626  m)   Wt 132 lb 12.8 oz (60.2 kg)   SpO2 95% Comment: at rest  BMI 22.80 kg/m  .  BMI Body mass index is 22.8 kg/m.  Wt Readings from Last 3 Encounters:  01/01/17 132 lb 12.8 oz (60.2 kg)  12/27/16 130 lb 3.2 oz (59.1 kg)  09/17/16 142 lb (64.4 kg)    General: Pleasant. Elderly female. Alert and in no acute distress. She is in a wheelchair.  Color looks sallow.  HEENT: Normal. Little hard of hearing.  Neck: Supple, no JVD, carotid bruits, or masses noted.  Cardiac: Irregular irregular rhythm. Rate is fast - 130 by my count.  No edema.  Respiratory:  Lungs are fairly clear to auscultation bilaterally with normal work of breathing.  GI: Soft and nontender.  MS: No deformity or atrophy. Gait not tested. She is in a wheelchair.  Skin: Warm and dry. Color is normal.  Neuro:  Strength and sensation are intact and no gross focal deficits noted.  Psych: Alert, appropriate and with normal affect.    LABORATORY DATA:  EKG:  EKG is ordered today. This shows AF with RVR - rate down to 114  Lab Results  Component Value Date   WBC 12.2 (H) 12/26/2016   HGB 10.9 (L) 12/26/2016   HCT 35.2 (L) 12/26/2016   PLT 250 12/26/2016   GLUCOSE 119 (H) 12/27/2016   CHOL 155 11/28/2015   TRIG 122 11/28/2015   HDL 58 11/28/2015   LDLCALC 73 11/28/2015   ALT 14 12/22/2016   AST 15 12/22/2016   NA 135 12/27/2016   K 3.7 12/27/2016   CL 101 12/27/2016   CREATININE 1.93 (H) 12/27/2016   BUN 31 (H)  12/27/2016   CO2 27 12/27/2016   TSH 1.438 12/22/2016   INR 1.9 12/31/2016   HGBA1C 6.4 (H) 06/10/2015     BNP (last 3 results)  Recent Labs  12/22/16 1153  BNP 851.0*    ProBNP (last 3 results) No results for input(s): PROBNP in the last 8760 hours.   Other Studies Reviewed Today:  Echo Study Conclusions 12/2016  - Left ventricle: The cavity size was normal. Wall thickness was   increased in a pattern of mild LVH. Indeterminant diastolic   function (atrial fibrillation). The estimated ejection fraction   was 35%. Diffuse hypokinesis. - Aortic valve: Trileaflet; moderately calcified leaflets.   Sclerosis without stenosis. There was trivial regurgitation. - Mitral valve: Mildly calcified annulus. Mildly calcified leaflets   . There was trivial regurgitation. - Left atrium: The atrium was moderately dilated. - Right ventricle: The cavity size was mildly dilated. Systolic   function was mildly reduced. - Right atrium: The atrium was moderately dilated. - Tricuspid valve: There was moderate-severe regurgitation. Peak   RV-RA gradient (S): 66 mm Hg. - Pulmonary arteries: PA peak pressure: 69 mm Hg (S). - Inferior vena cava: The vessel was normal in size. The   respirophasic diameter changes were in the normal range (>= 50%),   consistent with normal central venous pressure. - Pericardium, extracardiac: A trivial pericardial effusion was   identified.  Impressions:  - The patient was in atrial fibrillation. Normal LV size with mild   LV hypertrophy. EF 35% with diffuse hypokinesis. Mildly dilated   RV with mildly decreased systolic function. Aortic valve   sclerosis without significant stenosis. Moderate to severe   tricuspid regurgitation. Moderate to severe pulmonary   hypertension.  Assessment/Plan:  1. Newly found systolic HF - Presented with weakness and  LEE and found to have a newly reduced EF of 35% with diffuse hypokinesis.  She is not considered to be a  good candidate for further invasive work up given advanced age, and CKD. There was the question whether elevated HR may be contributing to decreased EF. HR clearly high on arrival to the office - better by the time we did the EKG but still elevated and not ideal - I think this could certainly worsen her heart failure situation. No longer on her CCB. I feel that she needs to have this added back despite the lower EF. Discussed with Dr. Irish Farley by phone - he agrees - will restart Diltiazem at 180 mg a day. She has home health coming out along with PT - they will be checking HR and BP. Clinically she seems to be holding her own. Overall prognosis however, is tenuous.   2. Chronic Afib: Rate is not controlled - adding back CCB but at lower dose.   3. CKD III:   4. Pulmonary HTN: On nitrates, unable to tolerate Revatio in the past   5. HTN: Elevated here today - adding back low dose CCB therapy.   6. Anemia: would follow.   Current medicines are reviewed with the patient today.  The patient does not have concerns regarding medicines other than what has been noted above.  The following changes have been made:  See above.  Labs/ tests ordered today include:    Orders Placed This Encounter  Procedures  . EKG 12-Lead     Disposition:   FU with Dr. Irish Farley in 2 to 3 months.    Patient is agreeable to this plan and will call if any problems develop in the interim.   SignedTruitt Merle, NP  01/01/2017 3:54 PM  [facility] [address] [place],   [address] Phone: [phone] Fax: [phone]

## 2017-01-05 ENCOUNTER — Other Ambulatory Visit: Payer: Self-pay | Admitting: Interventional Cardiology

## 2017-01-07 ENCOUNTER — Ambulatory Visit (INDEPENDENT_AMBULATORY_CARE_PROVIDER_SITE_OTHER): Payer: Medicare Other | Admitting: Cardiovascular Disease

## 2017-01-07 DIAGNOSIS — I480 Paroxysmal atrial fibrillation: Secondary | ICD-10-CM

## 2017-01-07 DIAGNOSIS — Z5181 Encounter for therapeutic drug level monitoring: Secondary | ICD-10-CM

## 2017-01-07 LAB — POCT INR: INR: 3.8

## 2017-01-08 ENCOUNTER — Ambulatory Visit: Payer: Medicare Other | Admitting: Physician Assistant

## 2017-01-10 ENCOUNTER — Telehealth: Payer: Self-pay | Admitting: Interventional Cardiology

## 2017-01-10 NOTE — Telephone Encounter (Signed)
New message    Tonya Shileyiffany is calling about getting a change for nurse frequency for pt. Please call.

## 2017-01-10 NOTE — Telephone Encounter (Signed)
Left message for patient to call back  

## 2017-01-10 NOTE — Telephone Encounter (Signed)
Follow up    She said she wants to go down to once a week for 6 weeks.

## 2017-01-17 ENCOUNTER — Ambulatory Visit (INDEPENDENT_AMBULATORY_CARE_PROVIDER_SITE_OTHER): Payer: Medicare Other | Admitting: Internal Medicine

## 2017-01-17 DIAGNOSIS — Z5181 Encounter for therapeutic drug level monitoring: Secondary | ICD-10-CM | POA: Diagnosis not present

## 2017-01-17 DIAGNOSIS — I482 Chronic atrial fibrillation, unspecified: Secondary | ICD-10-CM

## 2017-01-17 DIAGNOSIS — I480 Paroxysmal atrial fibrillation: Secondary | ICD-10-CM

## 2017-01-17 LAB — POCT INR: INR: 2.4

## 2017-01-31 ENCOUNTER — Telehealth: Payer: Self-pay | Admitting: *Deleted

## 2017-01-31 NOTE — Telephone Encounter (Signed)
[facility] & she is unable to obtain a venipucture for INR. Pt cannot come out today, therefore, pt was given the option to come in on Monday to have INR checked or HH could draw Monday. HH RN will visit the pt on Monday & draw an INR & send to our clinic. Pt aware that if they cannot obtain she will need to come into the office no later than Tuesday, November 13th.

## 2017-02-03 ENCOUNTER — Ambulatory Visit (INDEPENDENT_AMBULATORY_CARE_PROVIDER_SITE_OTHER): Payer: Medicare Other | Admitting: Internal Medicine

## 2017-02-03 DIAGNOSIS — Z5181 Encounter for therapeutic drug level monitoring: Secondary | ICD-10-CM | POA: Diagnosis not present

## 2017-02-03 DIAGNOSIS — I480 Paroxysmal atrial fibrillation: Secondary | ICD-10-CM

## 2017-02-03 LAB — PROTIME-INR: INR: 3.3 — AB (ref <=1.1)

## 2017-02-17 ENCOUNTER — Ambulatory Visit (INDEPENDENT_AMBULATORY_CARE_PROVIDER_SITE_OTHER): Payer: Medicare Other | Admitting: Internal Medicine

## 2017-02-17 DIAGNOSIS — I480 Paroxysmal atrial fibrillation: Secondary | ICD-10-CM

## 2017-02-17 DIAGNOSIS — Z5181 Encounter for therapeutic drug level monitoring: Secondary | ICD-10-CM | POA: Diagnosis not present

## 2017-02-17 LAB — POCT INR: INR: 2.3

## 2017-02-19 ENCOUNTER — Encounter: Payer: Self-pay | Admitting: Interventional Cardiology

## 2017-02-27 ENCOUNTER — Ambulatory Visit (INDEPENDENT_AMBULATORY_CARE_PROVIDER_SITE_OTHER): Payer: Medicare Other | Admitting: Interventional Cardiology

## 2017-02-27 ENCOUNTER — Encounter: Payer: Self-pay | Admitting: Interventional Cardiology

## 2017-02-27 VITALS — BP 156/80 | HR 63 | Ht 64.0 in | Wt 139.4 lb

## 2017-02-27 DIAGNOSIS — Z7901 Long term (current) use of anticoagulants: Secondary | ICD-10-CM

## 2017-02-27 DIAGNOSIS — I482 Chronic atrial fibrillation, unspecified: Secondary | ICD-10-CM

## 2017-02-27 DIAGNOSIS — I10 Essential (primary) hypertension: Secondary | ICD-10-CM | POA: Diagnosis not present

## 2017-02-27 DIAGNOSIS — I5022 Chronic systolic (congestive) heart failure: Secondary | ICD-10-CM

## 2017-02-27 NOTE — Patient Instructions (Signed)

## 2017-02-27 NOTE — Progress Notes (Signed)
Cardiology Office Note   Date:  02/27/2017   ID:  Tonya Farley, DOB 19-Apr-1919, MRN [id number]  PCP:  Lorene Dy, MD    No chief complaint on file.  Chronic systolic heart failure  Wt Readings from Last 3 Encounters:  02/27/17 139 lb 6.4 oz (63.2 kg)  01/01/17 132 lb 12.8 oz (60.2 kg)  12/27/16 130 lb 3.2 oz (59.1 kg)       History of Present Illness: Tonya Farley is a 83 y.o. female  has a history of chronic AFib, diastolic HF, CKD III, HTN, hypothyroidism, pulmonary HTN. She was admitted last year back in March then again in May with dyspnea in the setting of PNA and AFib RVR with pleural effusions that required thoracentesis. Echo back in 3/17 showed some RV failure and PASP 38mHg. Given her pulmonary HTN she was started on Revatio 220mTID, but had to stop this medication 2/2 to cost and side effects.   She was last seen in the office on 2/18 by Dr. VaIrish LackReported fatigue and dyspnea on exertion, but overall stable. Lasix dose at that time was 8083mn am and 19m50m pm. EKG showed Afib with rate controlled. On coumadin for OAC.[place] abrupt onset of weakness in the setting of progressive swelling in her legs. Remains in persistent AF - rate was ok. BNP 851. Echo with new finding of systolic HF with EF of 35% 64%h diffuse hypokinesis, moderate/sever TR and pulmonary hypertension. Seen by cardiology - CCB was stopped in light of her low EF. But noted concern that HR may be affecting her EF. Labetalol was increased to TID. Admission EKG with HR of 95 noted. No other testing felt to be needed.  She has done well since that time.  She saw Tonya Farley one visit. Diltiazem 180 was added in 11/18.  BP at home has been controlled per her report.  No low readings.    Denies : Chest pain. Dizziness. Leg edema. Nitroglycerin use. Orthopnea. Palpitations. Paroxysmal nocturnal dyspnea. Shortness of breath. Syncope.   Still driving short  distances. HH PT checks have run out.  She will see the office staff for Coumadin check.  No bleeding problems. BP at home has been controlled.  She did not bring the readings with her.      Past Medical History:  Diagnosis Date  . Chronic a-fib (HCC)[place]. Chronic back pain   . Chronic diastolic (congestive) heart failure (HCC)    a. Echo 3/17: mild LVH, EF 55%, no RWMA, mod AS (mean 10 mmHg, peak 21 mmHg), MAC, mild MR, severe LAE, mod reduced RVSF, mild RAE, mod TR, PASP 65 mmHg  . CKD (chronic kidney disease) stage 3, GFR 30-59 ml/min (HCC) 09/11/2015  . Hyperlipidemia   . Hypertension   . Hypothyroidism   . PNA (pneumonia)   . Pulmonary hypertension (HCC)    PASP 65 mmHg at Echo 3/17  . Thyroid disease     Past Surgical History:  Procedure Laterality Date  . APPENDECTOMY    . cataracts     bilateral  . EYE SURGERY     left eye "hole"  . TONSILLECTOMY       Current Outpatient Medications  Medication Sig Dispense Refill  . albuterol (PROVENTIL) (5 MG/ML) 0.5% nebulizer solution Take 0.5 mLs (2.5 mg total) by nebulization every 6 (six) hours as needed for wheezing or shortness of breath. 20 mL 12  .  atorvastatin (LIPITOR) 10 MG tablet Take 10 mg by mouth daily.    . B Complex Vitamins (B COMPLEX 1 PO) Take 1 tablet by mouth daily.    . brimonidine (ALPHAGAN) 0.15 % ophthalmic solution Place 1 drop into both eyes 3 (three) times daily.    . cholecalciferol (VITAMIN D) 400 units TABS tablet Take 400 Units by mouth daily.    Marland Kitchen diltiazem (CARDIZEM CD) 180 MG 24 hr capsule Take 1 capsule (180 mg total) by mouth daily. 90 capsule 3  . feeding supplement (BOOST / RESOURCE BREEZE) LIQD Take 1 Container by mouth 2 (two) times daily between meals. 60 Container 0  . furosemide (LASIX) 80 MG tablet Take 1 tablet (80 mg total) by mouth daily. 90 tablet 1  . isosorbide mononitrate (IMDUR) 30 MG 24 hr tablet Take 1 tablet (30 mg total) by mouth daily. 90 tablet 0  . labetalol (NORMODYNE)  200 MG tablet Take 1 tablet (200 mg total) by mouth 3 (three) times daily. TAKE 1 TABLET(200 MG) BY MOUTH TWICE DAILY 180 tablet 0  . latanoprost (XALATAN) 0.005 % ophthalmic solution Place 1 drop into both eyes at bedtime.  3  . levothyroxine (SYNTHROID, LEVOTHROID) 88 MCG tablet Take 88 mcg by mouth daily before breakfast.    . metolazone (ZAROXOLYN) 2.5 MG tablet Take 1 tablet (2.5 mg total) by mouth as needed (take if weight goes up 3 lbs in 1 day or 5 lbs in 1 week). (Patient taking differently: Take 2.5 mg by mouth as needed (take if weight goes up 3 lbs in 1 day or 5 lbs in 1 week or fluid). ) 20 tablet 1  . ondansetron (ZOFRAN-ODT) 4 MG disintegrating tablet Take 1 tablet (4 mg total) by mouth every 8 (eight) hours as needed for nausea or vomiting. 8 tablet 0  . pentoxifylline (TRENTAL) 400 MG CR tablet Take 400 mg by mouth 3 (three) times daily with meals.    Vladimir Faster Glycol-Propyl Glycol (SYSTANE OP) Apply 2 drops to eye daily as needed (dry eyes).     . potassium chloride SA (K-DUR,KLOR-CON) 20 MEQ tablet Take 20 mEq by mouth daily.    . predniSONE (DELTASONE) 10 MG tablet Take 10 mg by mouth 2 (two) times daily.  0  . timolol (TIMOPTIC) 0.5 % ophthalmic solution Place 1 drop into both eyes every morning.   3  . warfarin (COUMADIN) 1 MG tablet TAKE 2.5 OR 3 TABLETS AS DIRECTED BY COUMADIN CLINIC 80 tablet 3   No current facility-administered medications for this visit.     Allergies:   Penicillins; Sulfa antibiotics; and Fish allergy    Social History:  The patient  reports that  has never smoked. she has never used smokeless tobacco. She reports that she does not drink alcohol or use drugs.   Family History:  The patient's family history includes Cancer in her mother; Heart attack in her father; Heart disease in her sister and sister; Other in her father.    ROS:  Please see the history of present illness.   Otherwise, review of systems are positive for "feeling older".   All  other systems are reviewed and negative.    PHYSICAL EXAM: VS:  BP (!) 156/80   Pulse 63   Ht _0  (1.626 m)   Wt 139 lb 6.4 oz (63.2 kg)   SpO2 98%   BMI 23.93 kg/m  , BMI Body mass index is 23.93 kg/m. GEN: Well nourished, well developed, in  no acute distress , frail HEENT: normal  Neck: no JVD, carotid bruits, or masses Cardiac: irregularly irregular; no murmurs, rubs, or gallops,no edema  Respiratory:  clear to auscultation bilaterally, normal work of breathing GI: soft, nontender, nondistended, + BS MS: no deformity or atrophy  Skin: warm and dry, no rash Neuro:  Strength and sensation are intact Psych: euthymic mood, full affect    Recent Labs: 12/22/2016: ALT 14; B Natriuretic Peptide 851.0; TSH 1.438 12/26/2016: Hemoglobin 10.9; Platelets 250 12/27/2016: BUN 31; Creatinine, Ser 1.93; Potassium 3.7; Sodium 135   Lipid Panel    Component Value Date/Time   CHOL 155 11/28/2015 1535   TRIG 122 11/28/2015 1535   HDL 58 11/28/2015 1535   CHOLHDL 2.7 11/28/2015 1535   VLDL 24 11/28/2015 1535   LDLCALC 73 11/28/2015 1535     Other studies Reviewed: Additional studies/ records that were reviewed today with results demonstrating: EF 35% .   ASSESSMENT AND PLAN:  1. Chronic systolic heart failure: She appears euvolemic.  Continue medical therapy.  Continue low-sodium diet and daily weights. 2. AFib: Rate controlled.  Coumadin for stroke prevention. 3. Anticoagulated: No bleeding issues.  Benefits of anticoagulation continue to outweigh the risks at this time. 4. HTN: Blood pressure controlled at home.  Continue to monitor.  Higher in the doctor's office but will continue current medications.   Current medicines are reviewed at length with the patient today.  The patient concerns regarding her medicines were addressed.  The following changes have been made:  No change  Labs/ tests ordered today include:  No orders of the defined types were placed in this  encounter.   Recommend 150 minutes/week of aerobic exercise Low fat, low carb, high fiber diet recommended  Disposition:   FU in 6 months   Signed, Larae Grooms, MD  02/27/2017 3:37 PM    [facility] [place], [place], [place]  [address] Phone: [phone]; Fax: [phone]

## 2017-03-13 ENCOUNTER — Ambulatory Visit (INDEPENDENT_AMBULATORY_CARE_PROVIDER_SITE_OTHER): Payer: Medicare Other | Admitting: *Deleted

## 2017-03-13 DIAGNOSIS — I4891 Unspecified atrial fibrillation: Secondary | ICD-10-CM

## 2017-03-13 DIAGNOSIS — Z5181 Encounter for therapeutic drug level monitoring: Secondary | ICD-10-CM

## 2017-03-13 DIAGNOSIS — I482 Chronic atrial fibrillation, unspecified: Secondary | ICD-10-CM

## 2017-03-13 LAB — POCT INR: INR: 2.9

## 2017-03-13 NOTE — Patient Instructions (Signed)
Description   Continue  taking 2.5 pills everyday.  Recheck in 3 weeks in the office. Pt drinks 2 cans of boost daily, and is eating 1 serving of leafy green vegetable weekly.   Call with any problems or if ordered any new medications. [facility]in Clinic [phone] Do good serving of greens today

## 2017-04-03 ENCOUNTER — Ambulatory Visit (INDEPENDENT_AMBULATORY_CARE_PROVIDER_SITE_OTHER): Payer: Medicare Other | Admitting: *Deleted

## 2017-04-03 DIAGNOSIS — Z5181 Encounter for therapeutic drug level monitoring: Secondary | ICD-10-CM | POA: Diagnosis not present

## 2017-04-03 DIAGNOSIS — I4891 Unspecified atrial fibrillation: Secondary | ICD-10-CM | POA: Diagnosis not present

## 2017-04-03 LAB — POCT INR: INR: 2.2

## 2017-04-03 NOTE — Patient Instructions (Addendum)
Description   Continue taking 2.5 pills everyday.  Recheck in 2 weeks in the office. Pt not going to drink 2 cans of boost daily anymore, and will start eating 2-3 serving of leafy green vegetable weekly.  Call with any problems or if ordered any new medications. [facility]in Clinic [phone]

## 2017-04-05 ENCOUNTER — Encounter: Payer: Self-pay | Admitting: Interventional Cardiology

## 2017-04-17 ENCOUNTER — Ambulatory Visit (INDEPENDENT_AMBULATORY_CARE_PROVIDER_SITE_OTHER): Payer: Medicare Other | Admitting: *Deleted

## 2017-04-17 DIAGNOSIS — I482 Chronic atrial fibrillation, unspecified: Secondary | ICD-10-CM

## 2017-04-17 DIAGNOSIS — I4891 Unspecified atrial fibrillation: Secondary | ICD-10-CM

## 2017-04-17 DIAGNOSIS — Z5181 Encounter for therapeutic drug level monitoring: Secondary | ICD-10-CM | POA: Diagnosis not present

## 2017-04-17 LAB — POCT INR: INR: 2.5

## 2017-04-17 NOTE — Patient Instructions (Signed)
Description   Continue taking 2.5 pills everyday.  Recheck in 4 weeks in the office. Pt Is doing 1 bottle of Equate daily and will continue eating 2-3 serving of leafy green vegetable weekly.  Call with any problems or if ordered any new medications. [facility]in Clinic [phone]

## 2017-04-28 ENCOUNTER — Other Ambulatory Visit: Payer: Self-pay | Admitting: Interventional Cardiology

## 2017-05-10 ENCOUNTER — Other Ambulatory Visit: Payer: Self-pay | Admitting: Interventional Cardiology

## 2017-05-15 ENCOUNTER — Ambulatory Visit (INDEPENDENT_AMBULATORY_CARE_PROVIDER_SITE_OTHER): Payer: Medicare Other | Admitting: Pharmacist

## 2017-05-15 DIAGNOSIS — Z5181 Encounter for therapeutic drug level monitoring: Secondary | ICD-10-CM | POA: Diagnosis not present

## 2017-05-15 DIAGNOSIS — I4891 Unspecified atrial fibrillation: Secondary | ICD-10-CM

## 2017-05-15 LAB — POCT INR: INR: 3

## 2017-05-15 NOTE — Patient Instructions (Signed)
Description   Continue taking 2.5 pills everyday.  Recheck in 6 weeks in the office. Pt Is doing 1 bottle of Equate daily and will continue eating 2-3 serving of leafy green vegetable weekly.  Call with any problems or if ordered any new medications. Coumadin Clinic [phone]

## 2017-06-26 ENCOUNTER — Ambulatory Visit (INDEPENDENT_AMBULATORY_CARE_PROVIDER_SITE_OTHER): Payer: Medicare Other | Admitting: *Deleted

## 2017-06-26 DIAGNOSIS — I482 Chronic atrial fibrillation, unspecified: Secondary | ICD-10-CM

## 2017-06-26 DIAGNOSIS — Z5181 Encounter for therapeutic drug level monitoring: Secondary | ICD-10-CM | POA: Diagnosis not present

## 2017-06-26 DIAGNOSIS — I4891 Unspecified atrial fibrillation: Secondary | ICD-10-CM

## 2017-06-26 LAB — POCT INR: INR: 2.9

## 2017-06-26 NOTE — Patient Instructions (Signed)
Description   Continue taking 2.5 pills everyday.  Recheck in 6 weeks in the office. Pt Is doing 1 bottle of Equate daily and will continue eating 2-3 serving of leafy green vegetable weekly.  Call with any problems or if ordered any new medications. Coumadin Clinic [phone]

## 2017-07-05 ENCOUNTER — Ambulatory Visit (HOSPITAL_COMMUNITY)
Admission: EM | Admit: 2017-07-05 | Discharge: 2017-07-05 | Disposition: A | Discharge status: Home / Self Care | Payer: Medicare Other | Attending: Family Medicine | Admitting: Family Medicine

## 2017-07-05 ENCOUNTER — Other Ambulatory Visit: Payer: Self-pay

## 2017-07-05 ENCOUNTER — Encounter (HOSPITAL_COMMUNITY): Payer: Self-pay

## 2017-07-05 DIAGNOSIS — R062 Wheezing: Secondary | ICD-10-CM

## 2017-07-05 MED ORDER — ALBUTEROL SULFATE (2.5 MG/3ML) 0.083% IN NEBU: 2.5000 mg | INHALATION_SOLUTION | Freq: Four times a day (QID) | RESPIRATORY_TRACT | 1 refills | Status: DC | PRN

## 2017-07-05 NOTE — ED Triage Notes (Signed)
Pt presents with complaints of wheezing x 3 days. Denies any shortness of breath or chest pain.

## 2017-07-05 NOTE — ED Provider Notes (Signed)
[place]   [id number] 07/05/17 Arrival Time: 8280  ASSESSMENT & PLAN:  1. Wheezing     Meds ordered this encounter  Medications  . albuterol (PROVENTIL) (2.5 MG/3ML) 0.083% nebulizer solution    Sig: Take 3 mLs (2.5 mg total) by nebulization every 6 (six) hours as needed for wheezing or shortness of breath.    Dispense:  75 mL    Refill:  1   OTC symptom care as needed. May f/u with PCP or here as needed.  Reviewed expectations re: course of current medical issues. Questions answered. Outlined signs and symptoms indicating need for more acute intervention. Patient verbalized understanding. After Visit Summary given.   SUBJECTIVE: History from: patient and caregiver.  Tonya Farley is a 84 y.o. female who presents with complaint of intermittent wheezing. Her home care nurse noticed a few days ago. "Overall I feel fine mostly." Question if recent pollen has triggered wheezing. No SOB or CP associated. Fever: no. Overall normal PO intake without n/v. Sick contacts: no. OTC treatment: none. Has nebulizer at home but has not thought to use it.  Social History   Tobacco Use  Smoking Status Never Smoker  Smokeless Tobacco Never Used    ROS: As per HPI.   OBJECTIVE:  Vitals:   07/05/17 1244  BP: 123/74  Pulse: 99  Resp: 20  Temp: (!) 97.3 F (36.3 C)  TempSrc: Oral  SpO2: 95%     General appearance: alert; appears fatigued HEENT: normal Neck: supple without LAD Lungs: unlabored respirations, symmetrical air entry with very slight exp wheezes at bases; cough: absent; no respiratory distress Skin: warm and dry Psychological: alert and cooperative; normal mood and affect  Imaging:  No results found.  Allergies  Allergen Reactions  . Penicillins Hives and Swelling    Has patient had a PCN reaction causing immediate rash, facial/tongue/throat swelling, SOB or lightheadedness with hypotension: Yes Has patient had a PCN reaction causing severe  rash involving mucus membranes or skin necrosis: Yes Has patient had a PCN reaction that required hospitalization No Has patient had a PCN reaction occurring within the last 10 years: No If all of the above answers are "NO", then may proceed with Cephalosporin use.    . Sulfa Antibiotics Anaphylaxis and Swelling  . Fish Allergy Nausea And Vomiting    Past Medical History:  Diagnosis Date  . Chronic a-fib ([place])   . Chronic back pain   . Chronic diastolic (congestive) heart failure (HCC)    a. Echo 3/17: mild LVH, EF 55%, no RWMA, mod AS (mean 10 mmHg, peak 21 mmHg), MAC, mild MR, severe LAE, mod reduced RVSF, mild RAE, mod TR, PASP 65 mmHg  . CKD (chronic kidney disease) stage 3, GFR 30-59 ml/min (HCC) 09/11/2015  . Hyperlipidemia   . Hypertension   . Hypothyroidism   . PNA (pneumonia)   . Pulmonary hypertension (HCC)    PASP 65 mmHg at Echo 3/17  . Thyroid disease    Family History  Problem Relation Age of Onset  . Heart disease Sister   . Heart disease Sister   . Cancer Mother   . Other Father   . Heart attack Father    Social History   Socioeconomic History  . Marital status: Widowed    Spouse name: Not on file  . Number of children: Not on file  . Years of education: Not on file  . Highest education level: Not on file  Occupational History  . Not  on file  Social Needs  . Financial resource strain: Not on file  . Food insecurity:    Worry: Not on file    Inability: Not on file  . Transportation needs:    Medical: Not on file    Non-medical: Not on file  Tobacco Use  . Smoking status: Never Smoker  . Smokeless tobacco: Never Used  Substance and Sexual Activity  . Alcohol use: No  . Drug use: No  . Sexual activity: Not on file  Lifestyle  . Physical activity:    Days per week: Not on file    Minutes per session: Not on file  . Stress: Not on file  Relationships  . Social connections:    Talks on phone: Not on file    Gets together: Not on file     Attends religious service: Not on file    Active member of club or organization: Not on file    Attends meetings of clubs or organizations: Not on file    Relationship status: Not on file  . Intimate partner violence:    Fear of current or ex partner: Not on file    Emotionally abused: Not on file    Physically abused: Not on file    Forced sexual activity: Not on file  Other Topics Concern  . Not on file  Social History Narrative  . Not on file           Vanessa Kick, MD 07/05/17 1313

## 2017-07-09 ENCOUNTER — Emergency Department (HOSPITAL_COMMUNITY): Payer: Medicare Other

## 2017-07-09 ENCOUNTER — Encounter (HOSPITAL_COMMUNITY): Payer: Self-pay | Admitting: *Deleted

## 2017-07-09 ENCOUNTER — Other Ambulatory Visit: Payer: Self-pay

## 2017-07-09 ENCOUNTER — Inpatient Hospital Stay (HOSPITAL_COMMUNITY)
Admission: EM | Admit: 2017-07-09 | Discharge: 2017-07-14 | DRG: 291 | Disposition: A | Discharge status: Skilled Nursing Facility | Payer: Medicare Other | Attending: Internal Medicine | Admitting: Internal Medicine

## 2017-07-09 DIAGNOSIS — I481 Persistent atrial fibrillation: Secondary | ICD-10-CM | POA: Diagnosis present

## 2017-07-09 DIAGNOSIS — E785 Hyperlipidemia, unspecified: Secondary | ICD-10-CM | POA: Diagnosis present

## 2017-07-09 DIAGNOSIS — M25561 Pain in right knee: Secondary | ICD-10-CM | POA: Diagnosis not present

## 2017-07-09 DIAGNOSIS — D72829 Elevated white blood cell count, unspecified: Secondary | ICD-10-CM

## 2017-07-09 DIAGNOSIS — G8929 Other chronic pain: Secondary | ICD-10-CM | POA: Diagnosis present

## 2017-07-09 DIAGNOSIS — Z8249 Family history of ischemic heart disease and other diseases of the circulatory system: Secondary | ICD-10-CM | POA: Diagnosis not present

## 2017-07-09 DIAGNOSIS — I10 Essential (primary) hypertension: Secondary | ICD-10-CM | POA: Diagnosis not present

## 2017-07-09 DIAGNOSIS — M109 Gout, unspecified: Secondary | ICD-10-CM | POA: Diagnosis present

## 2017-07-09 DIAGNOSIS — J9601 Acute respiratory failure with hypoxia: Secondary | ICD-10-CM | POA: Diagnosis present

## 2017-07-09 DIAGNOSIS — I482 Chronic atrial fibrillation: Secondary | ICD-10-CM | POA: Diagnosis present

## 2017-07-09 DIAGNOSIS — M549 Dorsalgia, unspecified: Secondary | ICD-10-CM | POA: Diagnosis present

## 2017-07-09 DIAGNOSIS — M79661 Pain in right lower leg: Secondary | ICD-10-CM | POA: Diagnosis not present

## 2017-07-09 DIAGNOSIS — R791 Abnormal coagulation profile: Secondary | ICD-10-CM | POA: Diagnosis present

## 2017-07-09 DIAGNOSIS — I358 Other nonrheumatic aortic valve disorders: Secondary | ICD-10-CM | POA: Diagnosis present

## 2017-07-09 DIAGNOSIS — I4891 Unspecified atrial fibrillation: Secondary | ICD-10-CM | POA: Diagnosis present

## 2017-07-09 DIAGNOSIS — R4182 Altered mental status, unspecified: Secondary | ICD-10-CM | POA: Diagnosis not present

## 2017-07-09 DIAGNOSIS — I071 Rheumatic tricuspid insufficiency: Secondary | ICD-10-CM | POA: Diagnosis present

## 2017-07-09 DIAGNOSIS — M25461 Effusion, right knee: Secondary | ICD-10-CM | POA: Diagnosis not present

## 2017-07-09 DIAGNOSIS — Z66 Do not resuscitate: Secondary | ICD-10-CM | POA: Diagnosis present

## 2017-07-09 DIAGNOSIS — R001 Bradycardia, unspecified: Secondary | ICD-10-CM | POA: Diagnosis present

## 2017-07-09 DIAGNOSIS — I5043 Acute on chronic combined systolic (congestive) and diastolic (congestive) heart failure: Secondary | ICD-10-CM

## 2017-07-09 DIAGNOSIS — Z882 Allergy status to sulfonamides status: Secondary | ICD-10-CM

## 2017-07-09 DIAGNOSIS — E039 Hypothyroidism, unspecified: Secondary | ICD-10-CM | POA: Diagnosis present

## 2017-07-09 DIAGNOSIS — N184 Chronic kidney disease, stage 4 (severe): Secondary | ICD-10-CM | POA: Diagnosis present

## 2017-07-09 DIAGNOSIS — I5023 Acute on chronic systolic (congestive) heart failure: Secondary | ICD-10-CM | POA: Diagnosis not present

## 2017-07-09 DIAGNOSIS — J209 Acute bronchitis, unspecified: Secondary | ICD-10-CM | POA: Diagnosis present

## 2017-07-09 DIAGNOSIS — Z91013 Allergy to seafood: Secondary | ICD-10-CM

## 2017-07-09 DIAGNOSIS — R0602 Shortness of breath: Secondary | ICD-10-CM | POA: Diagnosis present

## 2017-07-09 DIAGNOSIS — I340 Nonrheumatic mitral (valve) insufficiency: Secondary | ICD-10-CM | POA: Diagnosis not present

## 2017-07-09 DIAGNOSIS — H9190 Unspecified hearing loss, unspecified ear: Secondary | ICD-10-CM | POA: Diagnosis present

## 2017-07-09 DIAGNOSIS — I272 Pulmonary hypertension, unspecified: Secondary | ICD-10-CM | POA: Diagnosis present

## 2017-07-09 DIAGNOSIS — I130 Hypertensive heart and chronic kidney disease with heart failure and stage 1 through stage 4 chronic kidney disease, or unspecified chronic kidney disease: Secondary | ICD-10-CM | POA: Diagnosis present

## 2017-07-09 DIAGNOSIS — T461X5A Adverse effect of calcium-channel blockers, initial encounter: Secondary | ICD-10-CM | POA: Diagnosis present

## 2017-07-09 DIAGNOSIS — Z79890 Hormone replacement therapy (postmenopausal): Secondary | ICD-10-CM

## 2017-07-09 DIAGNOSIS — Z7901 Long term (current) use of anticoagulants: Secondary | ICD-10-CM

## 2017-07-09 DIAGNOSIS — Z880 Allergy status to penicillin: Secondary | ICD-10-CM

## 2017-07-09 DIAGNOSIS — Z79899 Other long term (current) drug therapy: Secondary | ICD-10-CM

## 2017-07-09 HISTORY — DX: Chronic kidney disease, stage 4 (severe): N18.4

## 2017-07-09 LAB — MAGNESIUM: MAGNESIUM: 2.1 mg/dL (ref 1.7–2.4)

## 2017-07-09 LAB — COMPREHENSIVE METABOLIC PANEL
ALBUMIN: 3.5 g/dL (ref 3.5–5.0)
ALT: 20 U/L (ref 14–54)
AST: 24 U/L (ref 15–41)
Alkaline Phosphatase: 73 U/L (ref 38–126)
Anion gap: 12 (ref 5–15)
BUN: 26 mg/dL — AB (ref 6–20)
CO2: 23 mmol/L (ref 22–32)
CREATININE: 1.53 mg/dL — AB (ref 0.44–1.00)
Calcium: 9.3 mg/dL (ref 8.9–10.3)
Chloride: 107 mmol/L (ref 101–111)
GFR calc Af Amer: 32 mL/min — ABNORMAL LOW (ref >60)
GFR calc non Af Amer: 28 mL/min — ABNORMAL LOW (ref >60)
GLUCOSE: 128 mg/dL — AB (ref 65–99)
POTASSIUM: 3.6 mmol/L (ref 3.5–5.1)
Sodium: 142 mmol/L (ref 135–145)
Total Bilirubin: 1.1 mg/dL (ref 0.3–1.2)
Total Protein: 6.6 g/dL (ref 6.5–8.1)

## 2017-07-09 LAB — URINALYSIS, ROUTINE W REFLEX MICROSCOPIC
Bacteria, UA: NONE SEEN
Bilirubin Urine: NEGATIVE
GLUCOSE, UA: NEGATIVE mg/dL
HGB URINE DIPSTICK: NEGATIVE
KETONES UR: NEGATIVE mg/dL
Nitrite: NEGATIVE
Protein, ur: 30 mg/dL — AB
SQUAMOUS EPITHELIAL / LPF: NONE SEEN
Specific Gravity, Urine: 1.01 (ref 1.005–1.030)
pH: 6 (ref 5.0–8.0)

## 2017-07-09 LAB — PROTIME-INR
INR: 2.7
Prothrombin Time: 28.5 seconds — ABNORMAL HIGH (ref 11.4–15.2)

## 2017-07-09 LAB — CBC
HEMATOCRIT: 31.8 % — AB (ref 36.0–46.0)
HEMOGLOBIN: 10 g/dL — AB (ref 12.0–15.0)
MCH: 26.2 pg (ref 26.0–34.0)
MCHC: 31.4 g/dL (ref 30.0–36.0)
MCV: 83.2 fL (ref 78.0–100.0)
Platelets: 273 10*3/uL (ref 150–400)
RBC: 3.82 MIL/uL — AB (ref 3.87–5.11)
RDW: 18.4 % — ABNORMAL HIGH (ref 11.5–15.5)
WBC: 13.4 10*3/uL — ABNORMAL HIGH (ref 4.0–10.5)

## 2017-07-09 LAB — PROCALCITONIN

## 2017-07-09 LAB — TSH: TSH: 3.384 u[IU]/mL (ref 0.350–4.500)

## 2017-07-09 LAB — BRAIN NATRIURETIC PEPTIDE: B NATRIURETIC PEPTIDE 5: 897.3 pg/mL — AB (ref 0.0–100.0)

## 2017-07-09 LAB — I-STAT TROPONIN, ED: TROPONIN I, POC: 0.02 ng/mL (ref 0.00–0.08)

## 2017-07-09 MED ORDER — ACETAMINOPHEN 325 MG PO TABS: 650.0000 mg | ORAL_TABLET | ORAL | Status: DC | PRN

## 2017-07-09 MED ORDER — FUROSEMIDE 10 MG/ML IJ SOLN
60.0000 mg | Freq: Once | INTRAMUSCULAR | Status: AC
  Administered 2017-07-09: 60 mg via INTRAVENOUS
  Filled 2017-07-09: qty 6

## 2017-07-09 MED ORDER — LEVOTHYROXINE SODIUM 88 MCG PO TABS
88.0000 ug | ORAL_TABLET | Freq: Every day | ORAL | Status: DC
  Administered 2017-07-10 – 2017-07-14 (×5): 88 ug via ORAL
  Filled 2017-07-09 (×6): qty 1

## 2017-07-09 MED ORDER — TIMOLOL MALEATE 0.5 % OP SOLN
1.0000 [drp] | Freq: Every morning | OPHTHALMIC | Status: DC
  Administered 2017-07-11 – 2017-07-14 (×4): 1 [drp] via OPHTHALMIC
  Filled 2017-07-09: qty 5

## 2017-07-09 MED ORDER — METOPROLOL TARTRATE 25 MG PO TABS
25.0000 mg | ORAL_TABLET | Freq: Four times a day (QID) | ORAL | Status: DC
  Administered 2017-07-09 – 2017-07-10 (×3): 25 mg via ORAL
  Filled 2017-07-09 (×4): qty 1

## 2017-07-09 MED ORDER — WARFARIN - PHARMACIST DOSING INPATIENT
Freq: Every day | Status: DC
  Administered 2017-07-10 – 2017-07-12 (×3)

## 2017-07-09 MED ORDER — SODIUM CHLORIDE 0.9% FLUSH: 3.0000 mL | INTRAVENOUS | Status: DC | PRN

## 2017-07-09 MED ORDER — DILTIAZEM HCL-DEXTROSE 100-5 MG/100ML-% IV SOLN (PREMIX)
5.0000 mg/h | INTRAVENOUS | Status: DC
  Administered 2017-07-09: 5 mg/h via INTRAVENOUS
  Administered 2017-07-10: 7.5 mg/h via INTRAVENOUS
  Filled 2017-07-09 (×2): qty 100

## 2017-07-09 MED ORDER — LEVALBUTEROL HCL 0.63 MG/3ML IN NEBU: 0.6300 mg | INHALATION_SOLUTION | Freq: Three times a day (TID) | RESPIRATORY_TRACT | Status: DC | PRN

## 2017-07-09 MED ORDER — SODIUM CHLORIDE 0.9 % IV SOLN
250.0000 mL | INTRAVENOUS | Status: DC | PRN
  Administered 2017-07-11: 250 mL via INTRAVENOUS

## 2017-07-09 MED ORDER — SODIUM CHLORIDE 0.9% FLUSH
3.0000 mL | Freq: Two times a day (BID) | INTRAVENOUS | Status: DC
  Administered 2017-07-09 – 2017-07-12 (×5): 3 mL via INTRAVENOUS

## 2017-07-09 MED ORDER — POTASSIUM CHLORIDE CRYS ER 20 MEQ PO TBCR
20.0000 meq | EXTENDED_RELEASE_TABLET | Freq: Every day | ORAL | Status: DC
  Administered 2017-07-09 – 2017-07-10 (×2): 20 meq via ORAL
  Filled 2017-07-09 (×2): qty 1

## 2017-07-09 MED ORDER — BRIMONIDINE TARTRATE 0.15 % OP SOLN
1.0000 [drp] | Freq: Three times a day (TID) | OPHTHALMIC | Status: DC
  Administered 2017-07-09 – 2017-07-14 (×15): 1 [drp] via OPHTHALMIC
  Filled 2017-07-09 (×2): qty 5

## 2017-07-09 MED ORDER — ONDANSETRON HCL 4 MG/2ML IJ SOLN
4.0000 mg | Freq: Four times a day (QID) | INTRAMUSCULAR | Status: DC | PRN
  Administered 2017-07-10 (×2): 4 mg via INTRAVENOUS
  Filled 2017-07-09 (×2): qty 2

## 2017-07-09 MED ORDER — ATORVASTATIN CALCIUM 10 MG PO TABS
10.0000 mg | ORAL_TABLET | Freq: Every day | ORAL | Status: DC
  Administered 2017-07-09 – 2017-07-14 (×6): 10 mg via ORAL
  Filled 2017-07-09 (×7): qty 1

## 2017-07-09 MED ORDER — PENTOXIFYLLINE ER 400 MG PO TBCR
400.0000 mg | EXTENDED_RELEASE_TABLET | Freq: Three times a day (TID) | ORAL | Status: DC
  Administered 2017-07-10 – 2017-07-14 (×13): 400 mg via ORAL
  Filled 2017-07-09 (×14): qty 1

## 2017-07-09 MED ORDER — LATANOPROST 0.005 % OP SOLN
1.0000 [drp] | Freq: Every day | OPHTHALMIC | Status: DC
  Administered 2017-07-09 – 2017-07-13 (×5): 1 [drp] via OPHTHALMIC
  Filled 2017-07-09: qty 2.5

## 2017-07-09 MED ORDER — FUROSEMIDE 10 MG/ML IJ SOLN
80.0000 mg | Freq: Every day | INTRAMUSCULAR | Status: DC
  Administered 2017-07-10 – 2017-07-11 (×2): 80 mg via INTRAVENOUS
  Filled 2017-07-09 (×2): qty 8

## 2017-07-09 MED ORDER — WARFARIN SODIUM 2.5 MG PO TABS
2.5000 mg | ORAL_TABLET | Freq: Once | ORAL | Status: AC
  Administered 2017-07-09: 2.5 mg via ORAL
  Filled 2017-07-09: qty 1

## 2017-07-09 NOTE — Progress Notes (Signed)
ANTICOAGULATION CONSULT NOTE - Initial Consult  Pharmacy Consult for warfarin Indication: atrial fibrillation  Allergies  Allergen Reactions  . Penicillins Hives and Swelling    Has patient had a PCN reaction causing immediate rash, facial/tongue/throat swelling, SOB or lightheadedness with hypotension: Yes Has patient had a PCN reaction causing severe rash involving mucus membranes or skin necrosis: Yes Has patient had a PCN reaction that required hospitalization No Has patient had a PCN reaction occurring within the last 10 years: No If all of the above answers are "NO", then may proceed with Cephalosporin use.    . Sulfa Antibiotics Anaphylaxis and Swelling  . Fish Allergy Nausea And Vomiting    Patient Measurements: Height: _0  (162.6 cm) Weight: 139 lb (63 kg) IBW/kg (Calculated) : 54.7  Vital Signs: Temp: 97.6 F (36.4 C) (04/17 1114) Temp Source: Oral (04/17 1114) BP: 159/106 (04/17 1645) Pulse Rate: 124 (04/17 1645)  Labs: Recent Labs    07/09/17 1146  HGB 10.0*  HCT 31.8*  PLT 273  LABPROT 28.5*  INR 2.70  CREATININE 1.53*    Estimated Creatinine Clearance: 19 mL/min (A) (by C-G formula based on SCr of 1.53 mg/dL (H)).   Medical History: Past Medical History:  Diagnosis Date  . Chronic a-fib ([place])   . Chronic back pain   . Chronic diastolic (congestive) heart failure (HCC)    a. Echo 3/17: mild LVH, EF 55%, no RWMA, mod AS (mean 10 mmHg, peak 21 mmHg), MAC, mild MR, severe LAE, mod reduced RVSF, mild RAE, mod TR, PASP 65 mmHg  . CKD (chronic kidney disease) stage 3, GFR 30-59 ml/min (HCC) 09/11/2015  . Hyperlipidemia   . Hypertension   . Hypothyroidism   . Pulmonary hypertension (HCC)    PASP 65 mmHg at Echo 3/17  . Thyroid disease     Medications:  Scheduled:  . [START ON 07/10/2017] furosemide  80 mg Intravenous Daily  . metoprolol tartrate  25 mg Oral Q6H    Assessment: 84 yo female presented to ED on 4/17 with history of chronic atrial  fibrillation on warfarin. Pharmacy consulted to dose warfarin. Home dose is 2.5 mg daily. INR on admission is therapeutic at 2.7. Last dose was 4/16. No bleeding noted.  Goal of Therapy:  INR 2-3 Monitor platelets by anticoagulation protocol: Yes   Plan:  Warfarin 2.5 mg tonight x 1 Daily INR  Akeylah Hendel A Theon Sobotka 07/09/2017,4:51 PM

## 2017-07-09 NOTE — ED Notes (Signed)
Pt given sandwhich 

## 2017-07-09 NOTE — ED Notes (Signed)
Contacted pharmacy for medications

## 2017-07-09 NOTE — ED Notes (Signed)
Pt removed from 2L O2.  Sats remained in uper 90's, but hr increased to 130's and breathing became labored.  Placed back on 2L Many/

## 2017-07-09 NOTE — ED Triage Notes (Addendum)
Pt here via [facility] for rapid a-fib.  Pt called [facility] for stating she feels she is going to "die".  Initial rate was 180 to 200, sats were 88% RA (pt does not normally wear o2, and no changes had been made to her afib meds).  O2 increased to 97% with 3 L and HR decreased to 120 after 15 mg metoprolol and fluids.  BP 166/100, rr 22.  Cap refill greater than 5, pt pale and dry.  Given 1 L NS en-route.

## 2017-07-09 NOTE — Consult Note (Addendum)
Cardiology Consultation:   Patient ID: Tonya Farley; [id number]; 13-Jan-1920   Admit date: 07/09/2017 Date of Consult: 07/09/2017  Primary Care Provider: Lorene Dy, MD Primary Cardiologist: Larae Grooms, MD  Primary Electrophysiologist:     Patient Profile:   Tonya Farley is a 84 y.o. female with a hx of chronic atrial fibrillation, chronic systolic and diastolic heart failure, HTN, hypothyroidism, pulmonary HTN, and CKD stage III who is being seen today for the evaluation of CHF exacerbation and Afib at the request of Dr. Leonette Monarch.  History of Present Illness:   [name] has a complex cardiac history and follows with Dr. Irish Lack.  She has had several admission with shortness of breath in the setting of PNA, and pleural effusions requiring thoracenteses (06/09/15, 08/11/15). She was also noted to have Afib RVR. Echo 3/17 with RV failure and elevated PADP pressure. She was tried on revatio but could not continue the medication due to side effects and cost. durign her 04/2016 clinic visit, she was on lasix 80 mg in the morning and 40 mg in the evening and Afib was rate-controlled. She was hospitalized 12/2016 with weakness and lower extremity swelling. She was in rate-controlled Afib and echo found newly reduced LVEF of 35% with diffuse hypokinesis,moderate to sever TR and pulmonary hypertension. CCB was stopped in the setting of new systolic heart failure. Labetalol was increased to TID. She was seen by Truitt Merle NP in clnic on 01/01/2017 and did not feel well without her CCB. She was noted to have higher heart rate. In consultation with Dr. Irish Lack, this was restarted.    She was last seen in clinic with Dr. Irish Lack 02/27/17. She was euvolemic at that time with a weight of 139 lbs. She was doing well on the cardizem.  No medication changes. She was anticoagulated with coumadin and Afib was rate-controlled.  She called EMS today with chest pain and found to have shortness  of breath and rapid heart rate in the 120s, both resolved with administration of supplemental O2. She was transported to [facility] for further treatment.  On my interview, she states that she woke up this morning feeling well and made her bed. However, shortly after she became acutely short of breath and soon felt her heart racing. She describes intermittent heart racing that waxed and waned. She describes rapid heart rate for 1-2 min with some relief between. She called EMS as above. She states that she has not felt well, has felt weak, fatigued, and "run down" for the past 10 days or so. A lady cleans her house once per week and noticed she was wheezing with some new lower extremity swelling on Thurs.  She encouraged pt to seek care. She was seen in urgent care several days ago for wheezing and was given PRN albuterol. She does not report increased heart rate with albuterol use. She denies chest pain, but describes chest tightness that corresponded to her bouts of rapid heart rate. She denies orthopnea, dizzines, and feelings of syncope.  EMS reported her HR on arrival was in the 200s and administered 15 mg of IV lopressor. This decreased her heart rate to 120s. She was also provided with supplemental O2 which helped her heart rate and shortness of breath. She was transported to [facility]. On arrival, BNP was 897.3 with a sCr of 1.53, which is below her baseline. Troponin x 1 negative. Her weight is 139 lbs, which is her dry weight. She was found to have leukocytosis with WBC 13.4  but afebrile. Pressures have been labile since arrival. CXR with pleural effusions and mild congestion.   Past Medical History:  Diagnosis Date  . Chronic a-fib ([place])   . Chronic back pain   . Chronic diastolic (congestive) heart failure (HCC)    a. Echo 3/17: mild LVH, EF 55%, no RWMA, mod AS (mean 10 mmHg, peak 21 mmHg), MAC, mild MR, severe LAE, mod reduced RVSF, mild RAE, mod TR, PASP 65 mmHg  . CKD (chronic kidney disease) stage 3,  GFR 30-59 ml/min (HCC) 09/11/2015  . Hyperlipidemia   . Hypertension   . Hypothyroidism   . PNA (pneumonia)   . Pulmonary hypertension (HCC)    PASP 65 mmHg at Echo 3/17  . Thyroid disease     Past Surgical History:  Procedure Laterality Date  . APPENDECTOMY    . cataracts     bilateral  . EYE SURGERY     left eye "hole"  . TONSILLECTOMY       Home Medications:  Prior to Admission medications   Medication Sig Start Date End Date Taking? Authorizing Provider  albuterol (PROVENTIL) (2.5 MG/3ML) 0.083% nebulizer solution Take 3 mLs (2.5 mg total) by nebulization every 6 (six) hours as needed for wheezing or shortness of breath. 07/05/17  Yes Hagler, Aaron Edelman, MD  atorvastatin (LIPITOR) 10 MG tablet Take 10 mg by mouth daily.   Yes [provider]  B Complex Vitamins (B COMPLEX 1 PO) Take 1 tablet by mouth daily.   Yes [provider]  brimonidine (ALPHAGAN) 0.15 % ophthalmic solution Place 1 drop into both eyes 3 (three) times daily.   Yes [provider]  cholecalciferol (VITAMIN D) 400 units TABS tablet Take 400 Units by mouth daily.   Yes [provider]  diltiazem (CARDIZEM CD) 180 MG 24 hr capsule Take 1 capsule (180 mg total) by mouth daily. 01/01/17 07/09/17 Yes Burtis Junes, NP  feeding supplement (BOOST / RESOURCE BREEZE) LIQD Take 1 Container by mouth 2 (two) times daily between meals. 12/27/16  Yes Ghimire, Henreitta Leber, MD  furosemide (LASIX) 80 MG tablet TAKE 1 TABLET BY MOUTH EVERY DAY 04/28/17  Yes Jettie Booze, MD  isosorbide mononitrate (IMDUR) 30 MG 24 hr tablet Take 1 tablet (30 mg total) by mouth daily. 12/27/16 07/09/17 Yes Ghimire, Henreitta Leber, MD  labetalol (NORMODYNE) 200 MG tablet Take 1 tablet (200 mg total) by mouth 3 (three) times daily. TAKE 1 TABLET(200 MG) BY MOUTH TWICE DAILY 12/27/16  Yes Ghimire, Henreitta Leber, MD  latanoprost (XALATAN) 0.005 % ophthalmic solution Place 1 drop into both eyes at bedtime. 01/10/14  Yes [provider]  levothyroxine (SYNTHROID, LEVOTHROID) 88 MCG tablet Take 88 mcg by mouth daily before breakfast.   Yes [provider]  metolazone (ZAROXOLYN) 2.5 MG tablet Take 1 tablet (2.5 mg total) by mouth as needed (take if weight goes up 3 lbs in 1 day or 5 lbs in 1 week). Patient taking differently: Take 2.5 mg by mouth as needed (take if weight goes up 3 lbs in 1 day or 5 lbs in 1 week or fluid).  10/27/15  Yes Weaver, Scott T, PA-C  ondansetron (ZOFRAN-ODT) 4 MG disintegrating tablet Take 1 tablet (4 mg total) by mouth every 8 (eight) hours as needed for nausea or vomiting. 09/17/16  Yes Davonna Belling, MD  pentoxifylline (TRENTAL) 400 MG CR tablet Take 400 mg by mouth 3 (three) times daily with meals.   Yes [provider]  Polyethyl Glycol-Propyl Glycol (SYSTANE OP) Apply 2 drops to eye daily as needed (dry eyes).    Yes [provider]  potassium chloride SA (K-DUR,KLOR-CON) 20 MEQ tablet Take 20 mEq by mouth daily.   Yes [provider]  predniSONE (DELTASONE) 10 MG tablet Take 10 mg by mouth 2 (two) times daily. 02/24/17  Yes [provider]  timolol (TIMOPTIC) 0.5 % ophthalmic solution Place 1 drop into both eyes every morning.  12/28/13  Yes [provider]  warfarin (COUMADIN) 1 MG tablet TAKE 2.5 OR 3 TABLETS AS DIRECTED BY COUMADIN CLINIC Patient taking differently: 2.92m by mouth once daily 05/12/17  Yes VJettie Booze MD    Inpatient Medications: Scheduled Meds: . furosemide  60 mg Intravenous Once   Continuous Infusions:  PRN Meds:   Allergies:    Allergies  Allergen Reactions  . Penicillins Hives and Swelling    Has patient had a PCN reaction causing immediate rash, facial/tongue/throat swelling, SOB or lightheadedness with hypotension: Yes Has patient had a PCN reaction causing severe rash involving mucus membranes or skin necrosis: Yes Has patient had a PCN reaction that required hospitalization No Has  patient had a PCN reaction occurring within the last 10 years: No If all of the above answers are "NO", then may proceed with Cephalosporin use.    . Sulfa Antibiotics Anaphylaxis and Swelling  . Fish Allergy Nausea And Vomiting    Social History:   Social History   Socioeconomic History  . Marital status: Widowed    Spouse name: Not on file  . Number of children: Not on file  . Years of education: Not on file  . Highest education level: Not on file  Occupational History  . Not on file  Social Needs  . Financial resource strain: Not on file  . Food insecurity:    Worry: Not on file    Inability: Not on file  . Transportation needs:    Medical: Not on file    Non-medical: Not on file  Tobacco Use  . Smoking status: Never Smoker  . Smokeless tobacco: Never Used  Substance and Sexual Activity  . Alcohol use: No  . Drug use: No  . Sexual activity: Not on file  Lifestyle  . Physical activity:    Days per week: Not on file    Minutes per session: Not on file  . Stress: Not on file  Relationships  . Social connections:    Talks on phone: Not on file    Gets together: Not on file    Attends religious service: Not on file    Active member of club or organization: Not on file    Attends meetings of clubs or organizations: Not on file    Relationship status: Not on file  . Intimate partner violence:    Fear of current or ex partner: Not on file    Emotionally abused: Not on file    Physically abused: Not on file    Forced sexual activity: Not on file  Other Topics Concern  . Not on file  Social History Narrative  . Not on file    Family History:    Family History  Problem Relation Age of Onset  . Heart disease Sister   . Heart disease Sister   . Cancer Mother   . Other Father   . Heart attack Father      ROS:  Please see the history of present illness.   All other  ROS reviewed and negative.     Physical Exam/Data:   Vitals:   07/09/17 1125 07/09/17  1215 07/09/17 1230 07/09/17 1245  BP:  (!) 170/85 (!) 154/91 (!) 138/93  Pulse:  (!) 125 (!) 108 (!) 123  Resp:  (!) 24 (!) 34 16  Temp:      TempSrc:      SpO2: 97% 100% 98% 98%  Weight:      Height:       No intake or output data in the 24 hours ending 07/09/17 1325 Filed Weights   07/09/17 1121  Weight: 139 lb (63 kg)   Body mass index is 23.86 kg/m.  General:  Well nourished, well developed, elderly female in no acute distress HEENT: normal Neck: no JVD Vascular: No carotid bruits Cardiac:  Irregular rhythm, tachycardic rate, no murmurs Lungs:  Respirations unlabored, but crackles in bases Abd: soft, nontender, no hepatomegaly  Ext: trace to 1+ edema Musculoskeletal:  No deformities, BUE and BLE strength normal and equal Skin: warm and dry  Neuro:  CNs 2-12 intact, no focal abnormalities noted Psych:  Normal affect   EKG:  The EKG was personally reviewed and demonstrates:  Afib RVR Telemetry:  Telemetry was personally reviewed and demonstrates:  Afib RVR  Relevant CV Studies:  Echo 12/25/16: Study Conclusions - Left ventricle: The cavity size was normal. Wall thickness was   increased in a pattern of mild LVH. Indeterminant diastolic   function (atrial fibrillation). The estimated ejection fraction   was 35%. Diffuse hypokinesis. - Aortic valve: Trileaflet; moderately calcified leaflets.   Sclerosis without stenosis. There was trivial regurgitation. - Mitral valve: Mildly calcified annulus. Mildly calcified leaflets   . There was trivial regurgitation. - Left atrium: The atrium was moderately dilated. - Right ventricle: The cavity size was mildly dilated. Systolic   function was mildly reduced. - Right atrium: The atrium was moderately dilated. - Tricuspid valve: There was moderate-severe regurgitation. Peak   RV-RA gradient (S): 66 mm Hg. - Pulmonary arteries: PA peak pressure: 69 mm Hg (S). - Inferior vena cava: The vessel was normal in size. The    respirophasic diameter changes were in the normal range (>= 50%),   consistent with normal central venous pressure. - Pericardium, extracardiac: A trivial pericardial effusion was   identified.  Impressions: - The patient was in atrial fibrillation. Normal LV size with mild   LV hypertrophy. EF 35% with diffuse hypokinesis. Mildly dilated   RV with mildly decreased systolic function. Aortic valve   sclerosis without significant stenosis. Moderate to severe   tricuspid regurgitation. Moderate to severe pulmonary   hypertension.  Laboratory Data:  Chemistry Recent Labs  Lab 07/09/17 1146  NA 142  K 3.6  CL 107  CO2 23  GLUCOSE 128*  BUN 26*  CREATININE 1.53*  CALCIUM 9.3  GFRNONAA 28*  GFRAA 32*  ANIONGAP 12    Recent Labs  Lab 07/09/17 1146  PROT 6.6  ALBUMIN 3.5  AST 24  ALT 20  ALKPHOS 73  BILITOT 1.1   Hematology Recent Labs  Lab 07/09/17 1146  WBC 13.4*  RBC 3.82*  HGB 10.0*  HCT 31.8*  MCV 83.2  MCH 26.2  MCHC 31.4  RDW 18.4*  PLT 273   Cardiac EnzymesNo results for input(s): TROPONINI in the last 168 hours.  Recent Labs  Lab 07/09/17 1201  TROPIPOC 0.02    BNP Recent Labs  Lab 07/09/17 1146  BNP 897.3*    DDimer No results  for input(s): DDIMER in the last 168 hours.  Radiology/Studies:  Dg Chest Portable 1 View  Result Date: 07/09/2017 CLINICAL DATA:  Short of breath today, history of atrial fibrillation EXAM: PORTABLE CHEST 1 VIEW COMPARISON:  Chest x-ray of 12/22/2016 FINDINGS: The lungs are not well aerated. There is basilar volume loss with bilateral pleural effusions, cardiomegaly, and pulmonary vascular congestion, consistent with CHF. No pneumonia is seen. The bones appear somewhat osteopenic. IMPRESSION: 1. Poor inspiration with probable mild CHF with cardiomegaly and small pleural effusions. 2. No definite pneumonia is seen. Electronically Signed   By: Ivar Drape M.D.   On: 07/09/2017 12:07    Assessment and Plan:   1. Acute  on chronic systolic and diastolic heart failure - previously felt to not be an ischemic evaluation candidate given her advanced age, CKD, and DNR status.  - before one week ago, she has felt well on her home diuretic - 80 mg PO lasix daily - she has been given 60 mg IV lasix x 1 in the ED - weight is 139 lbs, which is her dry weight - CXR with basilar volume loss with bilateral pleural effusions, cardiomegaly,and pulmonary congestion consistent with CHF - recommend repeating echocardiogram, since her last echo was 12/2016 - If EF is further reduced, will nee to consider D/C diltiazem - will start 80 mg IV lasix, follow renal function and electrolytes - EKG with new poor R wave progression and posterior axis compared to prior EKG   2. Atrial fibrillation - HR this admission 110-130s - EMS reported HR close to 200s and gave 15 mg IV lopressor - EKG with Afib - home medications include cardizem 180 mg daily, labetalol 200 mg TID, coumadin - INR 2.70 today - will avoid amiodarone given her recurring pleural effusions - this RVR may be related to infection vs CHF exacerbation - will check magnesium and TSH (normal 11/2016)  - may use diltiazem drip in the interim for rate control - she is currently in the 130s - will change atenolol to lopressor 25 mg q6 hr for rate control - may consider digoxin in the future   3. Leukocytosis - WBC 13.4, afebrile - CXR without "definite signs of pneumonia" - she has not felt like herself, fatigued and "worn out" for the past 7-10 days - will order a UA - will defer to IM   4. Pleural effusions - will defer pulmonary consult to IM. She has a history of 2 prior thoracentesis for effusions.   5. CKD stage III - sCr on admission was 1.59, which is below her baseline. Follow renal function in the setting of IV diuresis.   6. HTN - may need additional agent for pressure control with the change in beta blockers as above.  - continue to follow, no  further changes in the immediate - will concentrate on rate control first   For questions or updates, please contact [place] Please consult [email or web address] for contact info under Cardiology/STEMI.   Signed, Tami Lin Duke, PA  07/09/2017 1:25 PM   History and all data above reviewed.  Patient examined.  I agree with the findings as above. The patient has a history of chronic atrial fib and systolic HF.  By her account this is usually well compensated and she remains active within her advanced age without acute cardiovascular symptoms.  She does not typically have palpitations or dyspnea.  She takes her BP and her heart rate is controlled typically less than 100.  Several  days ago she was treated in an urgent care with albuterol for wheezing but did well after that.  She reports a rather rapid onset of symptoms as above with acute SOB.  She felt very "puny suddenly" .  She has not had chest pain or presyncope or syncope.  She today on calling EMS had increased SOB.  She had been treated at an urgent    The patient exam reveals [id number]  ,  Lungs: Decreased breath sounds with scattered wheezes  ,  Abd: Positive bowel sounds, no rebound no guarding, Ext Mild edema  .  All available labs, radiology testing, previous records reviewed. Agree with documented assessment and plan. Acute SOB:  She has some evidence for volume overload.  However, I am also concerned that her symptoms could have been exacerbated by a non cardiac source.  I would suggest PA and lateral CXR.  We will repeat an echocardiogram and diurese.  Cycle cardiac enzymes.  Atrial fib:  This could be secondary rather than the primary issue.  We will however, given her adequate BP, treat with beta blockers and IV Cardizem.  Continue anticoagulation.    Jeneen Rinks Bryton Romagnoli  5:32 PM  07/09/2017

## 2017-07-09 NOTE — ED Provider Notes (Addendum)
[facility] EMERGENCY DEPARTMENT Provider Note  CSN: [id number] Arrival date & time: 07/09/17 1107  Chief Complaint(s) No chief complaint on file.  HPI Tonya Farley is a 84 y.o. female with a history of A. 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  Patient states that she had sudden onset of chest pain described as severe crushing pain that was nonradiating but was associated with tachycardia.  Patient reported that it felt like she was going to "die".  She reported that it was intermittent in nature and came on only when her heart rate was elevated.  They noted heart rates close to 200.  She denies any recent fevers or infections.  She did report that she was seen in urgent care several days ago for wheezing and was prescribed albuterol.  Patient states that she has been taking it however has not noticed any change in her heart rate due to the albuterol use.  She denied any use of albuterol today prior to her symptom onset.  Patient denied any associated nausea, vomiting, abdominal pain.  She denies any urinary symptoms.  Denies any lower extremity swelling.  States that she is compliant with her medications.  EMS was called who provided the patient with 15 mg of metoprolol.  They also noted that the patient was hypoxic on room air with saturations of 88%.  She was placed on supplemental oxygen.  Patient was also given IV fluids.  HPI  Past Medical History Past Medical History:  Diagnosis Date  . Chronic a-fib ([place])   . Chronic back pain   . Chronic diastolic (congestive) heart failure (HCC)    a. Echo 3/17: mild LVH, EF 55%, no RWMA, mod AS (mean 10 mmHg, peak 21 mmHg), MAC, mild MR, severe LAE, mod reduced RVSF, mild RAE, mod TR, PASP 65 mmHg  . CKD (chronic kidney disease) stage 3, GFR 30-59 ml/min (HCC)  09/11/2015  . Hyperlipidemia   . Hypertension   . Hypothyroidism   . PNA (pneumonia)   . Pulmonary hypertension (HCC)    PASP 65 mmHg at Echo 3/17  . Thyroid disease    Patient Active Problem List   Diagnosis Date Noted  . Chronic systolic heart failure ([place])   . Acute on chronic right heart failure ([place]) 12/22/2016  . CKD (chronic kidney disease), stage III w/ renal artery stenosis 12/22/2016  . Cellulitis of lower extremity 12/22/2016  . Chronic a-fib ([place]) 12/22/2016  . Hypothyroidism 12/22/2016  . Acute diastolic heart failure ([place]) 12/22/2016  . Elevated troponin 12/22/2016  . Acute hypokalemia 12/22/2016  . HTN (hypertension) 12/22/2016  . Dyslipidemia 12/22/2016  . Pulmonary hypertension, moderate to severe ([place]) 12/22/2016  . Abnormal urinalysis 12/22/2016  . Hypertensive heart disease 05/08/2016  . Chronic diastolic heart failure ([place]) 11/28/2015  . Anticoagulated 11/28/2015  . CKD (chronic kidney disease) stage 3, GFR 30-59 ml/min (HCC) 09/11/2015  . Pulmonary hypertension ([place])   . Pleural effusion on left   . Peripheral edema   . Hypokalemia   . Anasarca 08/10/2015  . Acute on chronic respiratory failure with hypoxia ([place])   . Pleural effusion   . Physical deconditioning   . Cough   . HCAP (healthcare-associated pneumonia)   . Chest congestion   . SOB (shortness of breath)   . Swelling   . Acute on chronic diastolic  heart failure ([place])   . HLD (hyperlipidemia)   . Influenza due to identified novel influenza A virus with other respiratory manifestations 06/07/2015  . Pleural effusion, bacterial   . Dyspnea   . AKI (acute kidney injury) ([place])   . CAP (community acquired pneumonia) 05/30/2015  . Renal artery stenosis ([place]) 08/08/2014  . Encounter for therapeutic drug monitoring 04/21/2013  . Hypertension 06/25/2012  . Hyperlipidemia 06/25/2012  . Atrial fibrillation ([place]) 06/25/2012  . Compression fracture of lumbar spine, non-traumatic ([place]) 06/25/2012    . Chronic back pain    Home Medication(s) Prior to Admission medications   Medication Sig Start Date End Date Taking? Authorizing Provider  albuterol (PROVENTIL) (2.5 MG/3ML) 0.083% nebulizer solution Take 3 mLs (2.5 mg total) by nebulization every 6 (six) hours as needed for wheezing or shortness of breath. 07/05/17  Yes Hagler, Aaron Edelman, MD  atorvastatin (LIPITOR) 10 MG tablet Take 10 mg by mouth daily.   Yes [provider]  B Complex Vitamins (B COMPLEX 1 PO) Take 1 tablet by mouth daily.   Yes [provider]  brimonidine (ALPHAGAN) 0.15 % ophthalmic solution Place 1 drop into both eyes 3 (three) times daily.   Yes [provider]  cholecalciferol (VITAMIN D) 400 units TABS tablet Take 400 Units by mouth daily.   Yes [provider]  diltiazem (CARDIZEM CD) 180 MG 24 hr capsule Take 1 capsule (180 mg total) by mouth daily. 01/01/17 07/09/17 Yes Burtis Junes, NP  feeding supplement (BOOST / RESOURCE BREEZE) LIQD Take 1 Container by mouth 2 (two) times daily between meals. 12/27/16  Yes Ghimire, Henreitta Leber, MD  furosemide (LASIX) 80 MG tablet TAKE 1 TABLET BY MOUTH EVERY DAY 04/28/17  Yes Jettie Booze, MD  isosorbide mononitrate (IMDUR) 30 MG 24 hr tablet Take 1 tablet (30 mg total) by mouth daily. 12/27/16 07/09/17 Yes Ghimire, Henreitta Leber, MD  labetalol (NORMODYNE) 200 MG tablet Take 1 tablet (200 mg total) by mouth 3 (three) times daily. TAKE 1 TABLET(200 MG) BY MOUTH TWICE DAILY 12/27/16  Yes Ghimire, Henreitta Leber, MD  latanoprost (XALATAN) 0.005 % ophthalmic solution Place 1 drop into both eyes at bedtime. 01/10/14  Yes [provider]  levothyroxine (SYNTHROID, LEVOTHROID) 88 MCG tablet Take 88 mcg by mouth daily before breakfast.   Yes [provider]  metolazone (ZAROXOLYN) 2.5 MG tablet Take 1 tablet (2.5 mg total) by mouth as needed (take if weight goes up 3 lbs in 1 day or 5 lbs in 1 week). Patient taking differently: Take 2.5 mg by  mouth as needed (take if weight goes up 3 lbs in 1 day or 5 lbs in 1 week or fluid).  10/27/15  Yes Weaver, Scott T, PA-C  ondansetron (ZOFRAN-ODT) 4 MG disintegrating tablet Take 1 tablet (4 mg total) by mouth every 8 (eight) hours as needed for nausea or vomiting. 09/17/16  Yes Davonna Belling, MD  pentoxifylline (TRENTAL) 400 MG CR tablet Take 400 mg by mouth 3 (three) times daily with meals.   Yes [provider]  Polyethyl Glycol-Propyl Glycol (SYSTANE OP) Apply 2 drops to eye daily as needed (dry eyes).    Yes [provider]  potassium chloride SA (K-DUR,KLOR-CON) 20 MEQ tablet Take 20 mEq by mouth daily.   Yes [provider]  predniSONE (DELTASONE) 10 MG tablet Take 10 mg by mouth 2 (two) times daily. 02/24/17  Yes [provider]  timolol (TIMOPTIC) 0.5 % ophthalmic solution Place 1 drop into  both eyes every morning.  12/28/13  Yes [provider]  warfarin (COUMADIN) 1 MG tablet TAKE 2.5 OR 3 TABLETS AS DIRECTED BY COUMADIN CLINIC Patient taking differently: 2.92m by mouth once daily 05/12/17  Yes VJettie Booze MD                                                                                                                                    Past Surgical History Past Surgical History:  Procedure Laterality Date  . APPENDECTOMY    . cataracts     bilateral  . EYE SURGERY     left eye "hole"  . TONSILLECTOMY     Family History Family History  Problem Relation Age of Onset  . Heart disease Sister   . Heart disease Sister   . Cancer Mother   . Other Father   . Heart attack Father     Social History Social History   Tobacco Use  . Smoking status: Never Smoker  . Smokeless tobacco: Never Used  Substance Use Topics  . Alcohol use: No  . Drug use: No   Allergies Penicillins; Sulfa antibiotics; and Fish allergy  Review of Systems Review of Systems All other systems are reviewed and are negative for acute change except  as noted in the HPI  Physical Exam Vital Signs  I have reviewed the triage vital signs BP (!) 158/100   Pulse (!) 128   Temp 97.6 F (36.4 C) (Oral)   Resp (!) 32   Ht 5' 4"  (1.626 m)   Wt 63 kg (139 lb)   SpO2 100%   BMI 23.86 kg/m   Physical Exam  ED Results and Treatments Labs (all labs ordered are listed, but only abnormal results are displayed) Labs Reviewed  CBC - Abnormal; Notable for the following components:      Result Value   WBC 13.4 (*)    RBC 3.82 (*)    Hemoglobin 10.0 (*)    HCT 31.8 (*)    RDW 18.4 (*)    All other components within normal limits  COMPREHENSIVE METABOLIC PANEL - Abnormal; Notable for the following components:   Glucose, Bld 128 (*)    BUN 26 (*)    Creatinine, Ser 1.53 (*)    GFR calc non Af Amer 28 (*)    GFR calc Af Amer 32 (*)    All other components within normal limits  BRAIN NATRIURETIC PEPTIDE - Abnormal; Notable for the following components:   B Natriuretic Peptide 897.3 (*)    All other components within normal limits  PROTIME-INR - Abnormal; Notable for the following components:   Prothrombin Time 28.5 (*)    All other components within normal limits  I-STAT TROPONIN, ED  EKG  EKG Interpretation  Date/Time:  Wednesday July 09 2017 11:15:02 EDT Ventricular Rate:  126 PR Interval:    QRS Duration: 111 QT Interval:  358 QTC Calculation: 519 R Axis:   -128 Text Interpretation:  Atrial fibrillation Markedly posterior QRS axis Consider anterior infarct Prolonged QT interval Otherwise no significant change Confirmed by Addison Lank [phone]) on 07/09/2017 1:28:03 PM      Radiology Dg Chest Portable 1 View  Result Date: 07/09/2017 CLINICAL DATA:  Short of breath today, history of atrial fibrillation EXAM: PORTABLE CHEST 1 VIEW COMPARISON:  Chest x-ray of 12/22/2016 FINDINGS: The lungs are not well  aerated. There is basilar volume loss with bilateral pleural effusions, cardiomegaly, and pulmonary vascular congestion, consistent with CHF. No pneumonia is seen. The bones appear somewhat osteopenic. IMPRESSION: 1. Poor inspiration with probable mild CHF with cardiomegaly and small pleural effusions. 2. No definite pneumonia is seen. Electronically Signed   By: Ivar Drape M.D.   On: 07/09/2017 12:07   Pertinent labs & imaging results that were available during my care of the patient were reviewed by me and considered in my medical decision making (see chart for details).  Medications Ordered in ED Medications  furosemide (LASIX) injection 60 mg (has no administration in time range)                                                                                                                                    Procedures Procedures CRITICAL CARE Performed by: Grayce Sessions Thena Devora Total critical care time: 30 minutes Critical care time was exclusive of separately billable procedures and treating other patients. Critical care was necessary to treat or prevent imminent or life-threatening deterioration. Critical care was time spent personally by me on the following activities: development of treatment plan with patient and/or surrogate as well as nursing, discussions with consultants, evaluation of patient's response to treatment, examination of patient, obtaining history from patient or surrogate, ordering and performing treatments and interventions, ordering and review of laboratory studies, ordering and review of radiographic studies, pulse oximetry and re-evaluation of patient's condition.   (including critical care time)  Medical Decision Making / ED Course I have reviewed the nursing notes for this encounter and the patient's prior records (if available in EHR or on provided paperwork).    Patient was noted to be in A. fib RVR with rates ranging from 90-130s.  Reports that her chest  pain has improved.  No evidence of volume overload on exam.  Patient is still hypoxic on room air requiring supplemental oxygen.    Will assess patient for CHF exacerbation.  It is unlikely that her tachycardia/A. fib RVR is related to albuterol use.  Chest x-ray obtained revealed evidence of pulmonary vascular congestion and pulmonary edema consistent with CHF exacerbation.  BNP close to 900 confirming.  INR is therapeutic thus I doubt possible blood clot.  CBC with stable hemoglobin.  Other  labs grossly reassuring with stable renal function and trop.  IV Lasix given.  Cardiology consulted for admission for continued management  They evaluated the patient and requested medicine admission. They will follow.  Final Clinical Impression(s) / ED Diagnoses Final diagnoses:  Atrial fibrillation with RVR (HCC)  Acute on chronic combined systolic and diastolic congestive heart failure ([place])      This chart was dictated using voice recognition software.  Despite best efforts to proofread,  errors can occur which can change the documentation meaning.     Fatima Blank, MD 07/09/17 [phone]

## 2017-07-09 NOTE — ED Notes (Signed)
Allison NP at bedside  

## 2017-07-09 NOTE — H&P (Addendum)
History and Physical    Tonya Farley TML:[id number] DOB: 1920-02-19 DOA: 07/09/2017  **Will admit patient based on the expectation that the patient will need hospitalization/ hospital care that crosses at least 2 midnights  PCP: Lorene Dy, MD   Attending physician: Bonner Puna  Patient coming from/Resides with: Private residence  Chief Complaint: Shortness of breath  HPI: Tonya Farley is a 84 y.o. female with medical history significant for chronic atrial fibrillation on warfarin anticoagulation, hypothyroidism, pulmonary hypertension, chronic systolic heart failure EF of 35% on echocardiogram October 2018, moderate to severe pulmonary hypertension 69 mmHg with associated moderate to severe tricuspid regurgitation and mild RV systolic dysfunction, age 38 chronic kidney disease.  She reports that last Thursday she began having some wheezing.  She has diminished hearing and her caregiver was the one to noticed the wheezing.  Patient had no awareness of shortness of breath or tachypalpitations.  Patient reports that sometimes she is unaware when her heart rate is going "too fast".  Symptoms persisted and she was taken to a local urgent care on Saturday where she was found to not have pneumonia but because of her audible wheezing she was started on an albuterol inhaler which she used frequently about every 4-6 hours.  She stated that the inhaler helped her symptoms.  Patient had abrupt worsening of her symptoms this morning and called EMS because she felt she was going to "die".  Upon arrival patient was found to be in A. fib with RVR with ventricular rates between 180 and 200 bpm.  She was hypoxemic with a room air saturation of 88%.  She was given 50 mg of IV metoprolol as well as some IV fluids with improvement in heart rate down to 120 bpm.  She was hypertensive.  She had diminished capillary refill she was pale and dry.  She was given an additional 1 L of saline in route to the hospital.   Chest x-ray revealed poor inspiratory effort with CHF as well as possible small bilateral pleural effusions.  BNP was elevated at greater than 800.  Placed on 2 L oxygen.  Renal function was stable.  INR was greater than 2.  She did have unexplained leukocytosis without fever.  Cardiology evaluated her in consultation and has initiated IV Cardizem infusion for her RVR and has started her on twice daily IV Lasix for her heart failure exacerbation.  Patient also reported increase in bilateral lower extremity edema over the past 1 week.  Given her respiratory symptoms and leukocytosis cardiology postulated possible pneumonia or UTI etiology.  Cardiology requested internal medicine admission.  ED Course:  Vital Signs: BP (!) 157/90   Pulse (!) 40   Temp 97.6 F (36.4 C) (Oral)   Resp 20   Ht _0  (1.626 m)   Wt 63 kg (139 lb)   SpO2 100%   BMI 23.86 kg/m  Chest X-ray: As above Lab data: Sodium 142, potassium 3.6, chloride 107, CO2 23, glucose 128, BUN 26, creatinine 1.53, LFTs normal, BNP 897.3, poc troponin 0 0.02, white count 13,300 differential not obtained, hemoglobin 10, platelets 273,000, PT 28.5, INR 2.7 Medications and treatments: Lasix 60 mg IV x1, Cardizem infusion titrated, Lopressor 25 mg p.o. x1  Review of Systems:  In addition to the HPI above,  No Fever-chills, myalgias or other constitutional symptoms No Headache, changes with Vision or hearing, new weakness, tingling, numbness in any extremity, dizziness, dysarthria or word finding difficulty, gait disturbance or imbalance, tremors or seizure  activity No problems swallowing food or Liquids, indigestion/reflux, choking or coughing while eating, abdominal pain with or after eating Chest pain present only during episodic RVR has resolved after ventricular rate decreased, no cough or awareness of palpitations No Abdominal pain, N/V, melena,hematochezia, dark tarry stools, constipation No dysuria, malodorous urine, hematuria or flank  pain No new skin rashes, lesions, masses or bruises, No new joint pains, aches, swelling or redness No recent unintentional weight gain or loss No polyuria, polydypsia or polyphagia   Past Medical History:  Diagnosis Date  . Chronic a-fib ([place])   . Chronic back pain   . Chronic diastolic (congestive) heart failure (HCC)    a. Echo 3/17: mild LVH, EF 55%, no RWMA, mod AS (mean 10 mmHg, peak 21 mmHg), MAC, mild MR, severe LAE, mod reduced RVSF, mild RAE, mod TR, PASP 65 mmHg  . CKD (chronic kidney disease) stage 4, GFR 15-29 ml/min (HCC) 09/11/2015  . Hyperlipidemia   . Hypertension   . Hypothyroidism   . Pulmonary hypertension (HCC)    PASP 65 mmHg at Echo 3/17  . Thyroid disease     Past Surgical History:  Procedure Laterality Date  . APPENDECTOMY    . cataracts     bilateral  . EYE SURGERY     left eye "hole"  . TONSILLECTOMY      Social History   Socioeconomic History  . Marital status: Widowed    Spouse name: Not on file  . Number of children: Not on file  . Years of education: Not on file  . Highest education level: Not on file  Occupational History  . Not on file  Social Needs  . Financial resource strain: Not on file  . Food insecurity:    Worry: Not on file    Inability: Not on file  . Transportation needs:    Medical: Not on file    Non-medical: Not on file  Tobacco Use  . Smoking status: Never Smoker  . Smokeless tobacco: Never Used  Substance and Sexual Activity  . Alcohol use: No  . Drug use: No  . Sexual activity: Not on file  Lifestyle  . Physical activity:    Days per week: Not on file    Minutes per session: Not on file  . Stress: Not on file  Relationships  . Social connections:    Talks on phone: Not on file    Gets together: Not on file    Attends religious service: Not on file    Active member of club or organization: Not on file    Attends meetings of clubs or organizations: Not on file    Relationship status: Not on file  .  Intimate partner violence:    Fear of current or ex partner: Not on file    Emotionally abused: Not on file    Physically abused: Not on file    Forced sexual activity: Not on file  Other Topics Concern  . Not on file  Social History Narrative  . Not on file    Mobility: Utilizes both a cane and a rolling walker Work history: Not obtained   Allergies  Allergen Reactions  . Penicillins Hives and Swelling    Has patient had a PCN reaction causing immediate rash, facial/tongue/throat swelling, SOB or lightheadedness with hypotension: Yes Has patient had a PCN reaction causing severe rash involving mucus membranes or skin necrosis: Yes Has patient had a PCN reaction that required hospitalization No Has patient had  a PCN reaction occurring within the last 10 years: No If all of the above answers are "NO", then may proceed with Cephalosporin use.    . Sulfa Antibiotics Anaphylaxis and Swelling  . Fish Allergy Nausea And Vomiting    Family History  Problem Relation Age of Onset  . Heart disease Sister   . Heart disease Sister   . Cancer Mother   . Other Father   . Heart attack Father      Prior to Admission medications   Medication Sig Start Date End Date Taking? Authorizing Provider  albuterol (PROVENTIL) (2.5 MG/3ML) 0.083% nebulizer solution Take 3 mLs (2.5 mg total) by nebulization every 6 (six) hours as needed for wheezing or shortness of breath. 07/05/17  Yes Hagler, Aaron Edelman, MD  atorvastatin (LIPITOR) 10 MG tablet Take 10 mg by mouth daily.   Yes [provider]  B Complex Vitamins (B COMPLEX 1 PO) Take 1 tablet by mouth daily.   Yes [provider]  brimonidine (ALPHAGAN) 0.15 % ophthalmic solution Place 1 drop into both eyes 3 (three) times daily.   Yes [provider]  cholecalciferol (VITAMIN D) 400 units TABS tablet Take 400 Units by mouth daily.   Yes [provider]  diltiazem (CARDIZEM CD) 180 MG 24 hr capsule Take 1 capsule (180  mg total) by mouth daily. 01/01/17 07/09/17 Yes Burtis Junes, NP  feeding supplement (BOOST / RESOURCE BREEZE) LIQD Take 1 Container by mouth 2 (two) times daily between meals. 12/27/16  Yes Ghimire, Henreitta Leber, MD  furosemide (LASIX) 80 MG tablet TAKE 1 TABLET BY MOUTH EVERY DAY 04/28/17  Yes Jettie Booze, MD  isosorbide mononitrate (IMDUR) 30 MG 24 hr tablet Take 1 tablet (30 mg total) by mouth daily. 12/27/16 07/09/17 Yes Ghimire, Henreitta Leber, MD  labetalol (NORMODYNE) 200 MG tablet Take 1 tablet (200 mg total) by mouth 3 (three) times daily. TAKE 1 TABLET(200 MG) BY MOUTH TWICE DAILY 12/27/16  Yes Ghimire, Henreitta Leber, MD  latanoprost (XALATAN) 0.005 % ophthalmic solution Place 1 drop into both eyes at bedtime. 01/10/14  Yes [provider]  levothyroxine (SYNTHROID, LEVOTHROID) 88 MCG tablet Take 88 mcg by mouth daily before breakfast.   Yes [provider]  metolazone (ZAROXOLYN) 2.5 MG tablet Take 1 tablet (2.5 mg total) by mouth as needed (take if weight goes up 3 lbs in 1 day or 5 lbs in 1 week). Patient taking differently: Take 2.5 mg by mouth as needed (take if weight goes up 3 lbs in 1 day or 5 lbs in 1 week or fluid).  10/27/15  Yes Weaver, Scott T, PA-C  ondansetron (ZOFRAN-ODT) 4 MG disintegrating tablet Take 1 tablet (4 mg total) by mouth every 8 (eight) hours as needed for nausea or vomiting. 09/17/16  Yes Davonna Belling, MD  pentoxifylline (TRENTAL) 400 MG CR tablet Take 400 mg by mouth 3 (three) times daily with meals.   Yes [provider]  Polyethyl Glycol-Propyl Glycol (SYSTANE OP) Apply 2 drops to eye daily as needed (dry eyes).    Yes [provider]  potassium chloride SA (K-DUR,KLOR-CON) 20 MEQ tablet Take 20 mEq by mouth daily.   Yes [provider]  timolol (TIMOPTIC) 0.5 % ophthalmic solution Place 1 drop into both eyes every morning.  12/28/13  Yes [provider]  warfarin (COUMADIN) 1 MG tablet TAKE 2.5 OR 3 TABLETS AS  DIRECTED BY COUMADIN CLINIC Patient taking differently: 2.73m by mouth once daily 05/12/17  Yes Jettie Booze, MD    Physical Exam: Vitals:   07/09/17 1515 07/09/17 1615 07/09/17 1645 07/09/17 1700  BP: (!) 150/90 (!) 156/93 (!) 159/106 (!) 157/90  Pulse: (!) 111 (!) 103 (!) 151 (!) 40  Resp: (!) 31 (!) _0 Temp:      TempSrc:      SpO2: 100% 100% 100% 100%  Weight:      Height:          Constitutional: NAD, calm, comfortable-becomes mildly tachypneic with talking Eyes: PERRL, lids and conjunctivae normal ENMT: Mucous membranes are moist. Posterior pharynx clear of any exudate or lesions.hard of hearing Neck: normal, supple, no masses, no thyromegaly Respiratory: Diffuse fine expiratory wheezes with scattered crackles throughout the posterior lung field.  Tachypnea most notable with speaking, slight increased work of breathing with speaking.  2 L nasal cannula oxygen with 98-100% saturations Cardiovascular: Irregular rhythm/atrial fibrillation with persistent tachycardia ventricular rates in the 120s, no murmurs / rubs / gallops.  1+ bilateral lower extremity edema. 2+ pedal pulses. No carotid bruits.  Cardizem infusion Abdomen: no tenderness, no masses palpated. No hepatosplenomegaly. Bowel sounds positive.  Musculoskeletal: no clubbing / cyanosis. No joint deformity upper and lower extremities. Good ROM, no contractures. Normal muscle tone.  Skin: no rashes, lesions, ulcers. No induration Neurologic: CN 2-12 grossly intact-appearing. Sensation intact, DTR normal. Strength 5/5 x all 4 extremities.  Psychiatric: Normal judgment and insight. Alert and oriented x 3. Normal mood.    Labs on Admission: I have personally reviewed following labs and imaging studies  CBC: Recent Labs  Lab 07/09/17 1146  WBC 13.4*  HGB 10.0*  HCT 31.8*  MCV 83.2  PLT 161   Basic Metabolic Panel: Recent Labs  Lab 07/09/17 1146  NA 142  K 3.6  CL 107  CO2 23  GLUCOSE 128*  BUN  26*  CREATININE 1.53*  CALCIUM 9.3   GFR: Estimated Creatinine Clearance: 19 mL/min (A) (by C-G formula based on SCr of 1.53 mg/dL (H)). Liver Function Tests: Recent Labs  Lab 07/09/17 1146  AST 24  ALT 20  ALKPHOS 73  BILITOT 1.1  PROT 6.6  ALBUMIN 3.5   No results for input(s): LIPASE, AMYLASE in the last 168 hours. No results for input(s): AMMONIA in the last 168 hours. Coagulation Profile: Recent Labs  Lab 07/09/17 1146  INR 2.70   Cardiac Enzymes: No results for input(s): CKTOTAL, CKMB, CKMBINDEX, TROPONINI in the last 168 hours. BNP (last 3 results) No results for input(s): PROBNP in the last 8760 hours. HbA1C: No results for input(s): HGBA1C in the last 72 hours. CBG: No results for input(s): GLUCAP in the last 168 hours. Lipid Profile: No results for input(s): CHOL, HDL, LDLCALC, TRIG, CHOLHDL, LDLDIRECT in the last 72 hours. Thyroid Function Tests: No results for input(s): TSH, T4TOTAL, FREET4, T3FREE, THYROIDAB in the last 72 hours. Anemia Panel: No results for input(s): VITAMINB12, FOLATE, FERRITIN, TIBC, IRON, RETICCTPCT in the last 72 hours. Urine analysis:    Component Value Date/Time   COLORURINE YELLOW 12/22/2016 1150   APPEARANCEUR CLEAR 12/22/2016 1150   LABSPEC 1.012 12/22/2016 1150   PHURINE 5.0 12/22/2016 1150   GLUCOSEU NEGATIVE 12/22/2016 1150   HGBUR NEGATIVE 12/22/2016 1150   BILIRUBINUR NEGATIVE 12/22/2016 1150   KETONESUR NEGATIVE 12/22/2016 1150   PROTEINUR 30 (A) 12/22/2016 1150   UROBILINOGEN 1.0 01/10/2015 2237   NITRITE NEGATIVE 12/22/2016 1150   LEUKOCYTESUR SMALL (A) 12/22/2016 1150   Sepsis Labs: _1 (procalcitonin:4,lacticidven:4) )  No results found for this or any previous visit (from the past 240 hour(s)).   Radiological Exams on Admission: Dg Chest Portable 1 View  Result Date: 07/09/2017 CLINICAL DATA:  Short of breath today, history of atrial fibrillation EXAM: PORTABLE CHEST 1 VIEW COMPARISON:  Chest x-ray of  12/22/2016 FINDINGS: The lungs are not well aerated. There is basilar volume loss with bilateral pleural effusions, cardiomegaly, and pulmonary vascular congestion, consistent with CHF. No pneumonia is seen. The bones appear somewhat osteopenic. IMPRESSION: 1. Poor inspiration with probable mild CHF with cardiomegaly and small pleural effusions. 2. No definite pneumonia is seen. Electronically Signed   By: Ivar Drape M.D.   On: 07/09/2017 12:07    EKG: (Independently reviewed) atrial fibrillation with underlying right bundle branch block, ventricular rate 126 bpm with QTC of 519 ms, abnormal R wave rotation with low voltage R waves throughout the septal lateral leads, no definitive acute ischemic changes  Assessment/Plan Principal Problem:  Acute respiratory failure with hypoxia/Acute on chronic systolic heart failure  -Patient presents with hypoxemia and profound dyspnea in the context of A. fib with RVR -Has known systolic heart failure with echo from October 2018 revealing EF of 35% with diffuse hypokinesis as well as mild RV systolic function in the context of severe pulmonary hypertension with associated to severe tricuspid regurgitation -Treatment of heart failure per cardiology -Continue Lasix IV as ordered -Daily weights with strict I's/O -ARB/ACE I secondary to severity of chronic kidney disease -Beta-blocker ordered -Echocardiogram ordered -Repeat 2 view chest x-ray in a.m. after diuresis -Pleural effusions seems small and film interpretation influenced by poor inspiratory effort -Continue supportive care with oxygen and wean to room air as tolerated  Active Problems:   Atrial fibrillation with RVR  -Rate poorly controlled -Cardiology managing: Cardizem infusion and short acting beta-blocker every 6 hours-if persistent or worsened systolic heart failure may need to discontinue calcium channel blocker -Continue warfarin-pharmacy to manage dosing -Patient developed wheezing last  week and was treated with albuterol MDI which may have precipitated RVR episode-no history of COPD and no smoking history per patient-if has requirement for SABA in future would utilize Xopenex -CHA2DS2-VASc=5 -Follow-up on TSH -Cardiology wishes to avoid amiodarone in the context of recurrent pleural effusions -Consideration given for possible addition of digoxin    Pulmonary HTN  -As above -Cardiology focusing on diuresis and rate control therefore to avoid hypotension I have held preadmission Imdur    CKD (chronic kidney disease) stage 4, GFR 15-29 ml/min  -Renal function stable and at baseline    Leukocytosis -No cough, fevers, chills or other infectious symptoms such as dysuria, nausea vomiting or diarrhea -Likely reflective of low perfusion in setting of A. fib AVR and hypoxemia -Completeness of evaluation obtain pro calcitonin, ESR, influenza PCR, respiratory viral panel, urinalysis and culture -No identified source so no indication to begin empiric antibiotics at this juncture -Do not suspect pneumonia at this time    Hypothyroidism, adult -Continue Synthroid -Low up on TSH    High blood pressure -Medications with adjustments as above    **Additional lab, imaging and/or diagnostic evaluation at discretion of supervising physician  DVT prophylaxis: Warfarin Code Status: DO NOT RESUSCITATE Family Communication: Daughter at bedside Disposition Plan: Home Consults called: Cardiology/[place]och[place]    ELLIS,ALLISON L. ANP-BC [facility] Pager [phone]   If 7PM-7AM, please contact night-coverage [email or web address] Password [facility]  07/09/2017, 5:41 PM    Attending MD note  Patient was independently seen and examined, treatment plan was discussed with  the  Advance Practice Provider. I have personally reviewed the clinical findings, labs, ECG, imaging studies and management of this patient in detail. I have also reviewed the orders written for this patient which were  under my direction. I agree with the documentation, as recorded by the Advance Practice Provider.   Briefly, ALSACE DOWD is a 84 y.o. female with a history of combined CHF, pulmonary HTN unable to afford revatio, AFib on coumadin, stage III CKD, hypothyroidism, and wheezing and pleural effusions in the setting of pneumonia 2017. She presented to the ED for worsening shortness of breath worsening abruptly this morning after generally feeling weak with fatigue over the previous few days. She was told she was wheezing, and told to use her nebulizer machine at home after a negative CXR at urgent care. She denies any cough, abdominal pain, N/V/D, myalgias, arthralgias, rashes, sick contacts, fever, chills, night sweats. When symptoms worsened she called EMS who found her to be hypoxic and in AFib w/rate into 180-200's. In the ED AFib with RVR continued, she remained hypoxic with CXR and lab data indicating pulmonary edema due to heart failure exacerbation. Lasix was given, rate control agents started, and cardiology called to admit. They preferred that IM admit. At the time of my evaluation, she reports her fatigue is improved, dyspnea improved but remains, denies any chest pain.   On exam she is pleasant, elderly, in no distress, nonlabored with supplemental oxygen diminished at bases with scant bibasilar crackles and expiratory wheezes throughout. Irreg irreg with rate 80-110's. No joint swelling, erythema, tenderness, wounds/ulcers, or abdominal tenderness. ECG demonstrates AFib with RVR.   A/P:  Acute hypoxic respiratory failure: due to acute CHF exacerbation, AFib with RVR, and possible underlying pulmonary infection (would favor viral illness over bacterial pneumonia).  - Continue supplemental oxygen.  - Appreciate cardiology assistance with diuresis and rate control - Repeating echo - Strict I/O, daily weights - Maintain K>4, Mg>2, check TSH and continue home synthroid.  - Nebs at home helped,  so will continue xopenex prn here. Could consider pulmonology follow up  Leukocytosis: There are no localizing details in history or exam that would indicate infection save for wheezing which can happen with pulmonary edema. Malaise could be from uncontrolled AFib or viral syndrome. UA negative, CXR with abnormalities not consistent with pneumonia, but was portable.  - Check cultures if has a fever.  - Checking RVP, flu PCR, and PCT, ESR - Dedicated CXR 2v in AM. If pleural effusions are evident, would either consult pulm or wait until further diuresed and repeat CXR. Has had thoracentesis x2 in 2017 in setting of flu/pneumonia and again in setting of CHF (both negative cultures and transudate by report)  Otherwise per note above.   Vance Gather, MD 07/09/2017 7:02 PM

## 2017-07-09 NOTE — ED Notes (Signed)
Per lab they will add on the procalcitonin.

## 2017-07-10 ENCOUNTER — Inpatient Hospital Stay (HOSPITAL_COMMUNITY): Payer: Medicare Other

## 2017-07-10 ENCOUNTER — Other Ambulatory Visit: Payer: Self-pay

## 2017-07-10 LAB — BASIC METABOLIC PANEL
ANION GAP: 12 (ref 5–15)
BUN: 26 mg/dL — ABNORMAL HIGH (ref 6–20)
CO2: 24 mmol/L (ref 22–32)
Calcium: 9.2 mg/dL (ref 8.9–10.3)
Chloride: 106 mmol/L (ref 101–111)
Creatinine, Ser: 1.42 mg/dL — ABNORMAL HIGH (ref 0.44–1.00)
GFR calc Af Amer: 35 mL/min — ABNORMAL LOW (ref >60)
GFR, EST NON AFRICAN AMERICAN: 30 mL/min — AB (ref >60)
GLUCOSE: 109 mg/dL — AB (ref 65–99)
POTASSIUM: 3.4 mmol/L — AB (ref 3.5–5.1)
Sodium: 142 mmol/L (ref 135–145)

## 2017-07-10 LAB — BLOOD GAS, ARTERIAL
Acid-Base Excess: 3.9 mmol/L — ABNORMAL HIGH (ref 0.0–2.0)
Bicarbonate: 27.8 mmol/L (ref 20.0–28.0)
DRAWN BY: 52076
O2 Content: 2 L/min
O2 SAT: 96.9 %
PCO2 ART: 41.8 mmHg (ref 32.0–48.0)
PH ART: 7.439 (ref 7.350–7.450)
Patient temperature: 98.6
pO2, Arterial: 94.4 mmHg (ref 83.0–108.0)

## 2017-07-10 LAB — COMPREHENSIVE METABOLIC PANEL
ALT: 17 U/L (ref 14–54)
ANION GAP: 15 (ref 5–15)
AST: 24 U/L (ref 15–41)
Albumin: 3.4 g/dL — ABNORMAL LOW (ref 3.5–5.0)
Alkaline Phosphatase: 73 U/L (ref 38–126)
BILIRUBIN TOTAL: 1.5 mg/dL — AB (ref 0.3–1.2)
BUN: 25 mg/dL — AB (ref 6–20)
CHLORIDE: 106 mmol/L (ref 101–111)
CO2: 23 mmol/L (ref 22–32)
Calcium: 9.5 mg/dL (ref 8.9–10.3)
Creatinine, Ser: 1.45 mg/dL — ABNORMAL HIGH (ref 0.44–1.00)
GFR calc Af Amer: 34 mL/min — ABNORMAL LOW (ref >60)
GFR calc non Af Amer: 30 mL/min — ABNORMAL LOW (ref >60)
GLUCOSE: 117 mg/dL — AB (ref 65–99)
Potassium: 4.2 mmol/L (ref 3.5–5.1)
SODIUM: 144 mmol/L (ref 135–145)
TOTAL PROTEIN: 6.3 g/dL — AB (ref 6.5–8.1)

## 2017-07-10 LAB — PROTIME-INR
INR: 2.82
Prothrombin Time: 29.4 seconds — ABNORMAL HIGH (ref 11.4–15.2)

## 2017-07-10 LAB — CBC
HEMATOCRIT: 37.5 % (ref 36.0–46.0)
HEMATOCRIT: 40.4 % (ref 36.0–46.0)
HEMOGLOBIN: 11.9 g/dL — AB (ref 12.0–15.0)
Hemoglobin: 12.5 g/dL (ref 12.0–15.0)
MCH: 26.7 pg (ref 26.0–34.0)
MCH: 25.8 pg — ABNORMAL LOW (ref 26.0–34.0)
MCHC: 31.7 g/dL (ref 30.0–36.0)
MCHC: 30.9 g/dL (ref 30.0–36.0)
MCV: 84.1 fL (ref 78.0–100.0)
MCV: 83.3 fL (ref 78.0–100.0)
Platelets: 217 10*3/uL (ref 150–400)
Platelets: 236 10*3/uL (ref 150–400)
RBC: 4.46 MIL/uL (ref 3.87–5.11)
RBC: 4.85 MIL/uL (ref 3.87–5.11)
RDW: 18.3 % — ABNORMAL HIGH (ref 11.5–15.5)
RDW: 17.9 % — AB (ref 11.5–15.5)
WBC: 12 10*3/uL — ABNORMAL HIGH (ref 4.0–10.5)
WBC: 16.6 10*3/uL — ABNORMAL HIGH (ref 4.0–10.5)

## 2017-07-10 LAB — RESPIRATORY PANEL BY PCR
Adenovirus: NOT DETECTED
Bordetella pertussis: NOT DETECTED
CORONAVIRUS 229E-RVPPCR: NOT DETECTED
CORONAVIRUS OC43-RVPPCR: NOT DETECTED
Chlamydophila pneumoniae: NOT DETECTED
Coronavirus HKU1: NOT DETECTED
Coronavirus NL63: NOT DETECTED
Influenza A: NOT DETECTED
Influenza B: NOT DETECTED
MYCOPLASMA PNEUMONIAE-RVPPCR: NOT DETECTED
Metapneumovirus: NOT DETECTED
PARAINFLUENZA VIRUS 1-RVPPCR: NOT DETECTED
PARAINFLUENZA VIRUS 4-RVPPCR: NOT DETECTED
Parainfluenza Virus 2: NOT DETECTED
Parainfluenza Virus 3: NOT DETECTED
Respiratory Syncytial Virus: NOT DETECTED
Rhinovirus / Enterovirus: NOT DETECTED

## 2017-07-10 LAB — INFLUENZA PANEL BY PCR (TYPE A & B)
Influenza A By PCR: NEGATIVE
Influenza B By PCR: NEGATIVE

## 2017-07-10 LAB — MRSA PCR SCREENING: MRSA by PCR: NEGATIVE

## 2017-07-10 MED ORDER — ORAL CARE MOUTH RINSE
15.0000 mL | Freq: Two times a day (BID) | OROMUCOSAL | Status: DC
  Administered 2017-07-10 – 2017-07-14 (×7): 15 mL via OROMUCOSAL

## 2017-07-10 MED ORDER — METOPROLOL TARTRATE 50 MG PO TABS
50.0000 mg | ORAL_TABLET | Freq: Four times a day (QID) | ORAL | Status: DC
  Administered 2017-07-10 – 2017-07-12 (×7): 50 mg via ORAL
  Filled 2017-07-10 (×7): qty 1

## 2017-07-10 MED ORDER — WARFARIN SODIUM 2.5 MG PO TABS
2.5000 mg | ORAL_TABLET | Freq: Every day | ORAL | Status: DC
  Administered 2017-07-10 – 2017-07-11 (×2): 2.5 mg via ORAL
  Filled 2017-07-10 (×3): qty 1

## 2017-07-10 MED ORDER — LEVALBUTEROL HCL 0.63 MG/3ML IN NEBU
0.6300 mg | INHALATION_SOLUTION | Freq: Three times a day (TID) | RESPIRATORY_TRACT | Status: DC
  Administered 2017-07-10 – 2017-07-11 (×3): 0.63 mg via RESPIRATORY_TRACT
  Filled 2017-07-10 (×3): qty 3

## 2017-07-10 MED ORDER — DILTIAZEM HCL-DEXTROSE 100-5 MG/100ML-% IV SOLN (PREMIX)
5.0000 mg/h | INTRAVENOUS | Status: DC
  Administered 2017-07-10 – 2017-07-11 (×2): 5 mg/h via INTRAVENOUS
  Filled 2017-07-10 (×2): qty 100

## 2017-07-10 MED ORDER — LEVALBUTEROL HCL 0.63 MG/3ML IN NEBU
0.6300 mg | INHALATION_SOLUTION | RESPIRATORY_TRACT | Status: DC | PRN
  Filled 2017-07-10: qty 3

## 2017-07-10 MED ORDER — LEVALBUTEROL HCL 0.63 MG/3ML IN NEBU: 0.6300 mg | INHALATION_SOLUTION | Freq: Four times a day (QID) | RESPIRATORY_TRACT | Status: DC

## 2017-07-10 MED ORDER — DILTIAZEM HCL 60 MG PO TABS
30.0000 mg | ORAL_TABLET | Freq: Three times a day (TID) | ORAL | Status: DC
  Administered 2017-07-10: 30 mg via ORAL
  Filled 2017-07-10: qty 1

## 2017-07-10 MED ORDER — POTASSIUM CHLORIDE CRYS ER 20 MEQ PO TBCR
20.0000 meq | EXTENDED_RELEASE_TABLET | Freq: Two times a day (BID) | ORAL | Status: DC
  Administered 2017-07-11 – 2017-07-13 (×5): 20 meq via ORAL
  Filled 2017-07-10 (×5): qty 1

## 2017-07-10 MED ORDER — POTASSIUM CHLORIDE CRYS ER 20 MEQ PO TBCR
20.0000 meq | EXTENDED_RELEASE_TABLET | Freq: Once | ORAL | Status: AC
Start: 2017-07-10 — End: 2017-07-10
  Administered 2017-07-10: 20 meq via ORAL
  Filled 2017-07-10: qty 1

## 2017-07-10 NOTE — Progress Notes (Addendum)
Progress Note  Patient Name: Tonya Farley Date of Encounter: 07/10/2017  Primary Cardiologist: Lance MussJayadeep Varanasi, MD   Subjective   Still somewhat SOB, says her symptom started the morning she arrived. No CP  Inpatient Medications    Scheduled Meds: . atorvastatin  10 mg Oral Daily  . brimonidine  1 drop Both Eyes TID  . furosemide  80 mg Intravenous Daily  . latanoprost  1 drop Both Eyes QHS  . levothyroxine  88 mcg Oral QAC breakfast  . mouth rinse  15 mL Mouth Rinse BID  . metoprolol tartrate  25 mg Oral Q6H  . pentoxifylline  400 mg Oral TID WC  . potassium chloride SA  20 mEq Oral Daily  . sodium chloride flush  3 mL Intravenous Q12H  . timolol  1 drop Both Eyes q morning - 10a  . Warfarin - Pharmacist Dosing Inpatient   Does not apply q1800   Continuous Infusions: . sodium chloride    . diltiazem (CARDIZEM) infusion Stopped (07/10/17 0411)   PRN Meds: sodium chloride, acetaminophen, levalbuterol, ondansetron (ZOFRAN) IV, sodium chloride flush   Vital Signs    Vitals:   07/09/17 2345 07/10/17 0030 07/10/17 0215 07/10/17 0315  BP: (!) 151/70 (!) 154/67 (!) 164/50 (!) 167/85  Pulse: 67  61 77  Resp: (!) 21 15 12 16   Temp:    97.6 F (36.4 C)  TempSrc:    Oral  SpO2: 100% 99% 100% 100%  Weight:    141 lb 8.6 oz (64.2 kg)  Height:    5\' 4"  (1.626 m)    Intake/Output Summary (Last 24 hours) at 07/10/2017 0923 Last data filed at 07/10/2017 0612 Gross per 24 hour  Intake 131.79 ml  Output 1000 ml  Net -868.21 ml   Filed Weights   07/09/17 1121 07/10/17 0315  Weight: 139 lb (63 kg) 141 lb 8.6 oz (64.2 kg)    Telemetry    afib with HR 90s - Personally Reviewed  ECG    afib with RVR - Personally Reviewed  Physical Exam   GEN: No acute distress.   Neck: No JVD Cardiac: irregularly irregular, no murmurs, rubs, or gallops.  Respiratory: Markedly diminished breath sound in bilateral bases GI: Soft, nontender, non-distended  MS: No edema; No  deformity. Neuro:  Nonfocal  Psych: Normal affect   Labs    Chemistry Recent Labs  Lab 07/09/17 1146 07/10/17 0337  NA 142 142  K 3.6 3.4*  CL 107 106  CO2 23 24  GLUCOSE 128* 109*  BUN 26* 26*  CREATININE 1.53* 1.42*  CALCIUM 9.3 9.2  PROT 6.6  --   ALBUMIN 3.5  --   AST 24  --   ALT 20  --   ALKPHOS 73  --   BILITOT 1.1  --   GFRNONAA 28* 30*  GFRAA 32* 35*  ANIONGAP 12 12     Hematology Recent Labs  Lab 07/09/17 1146 07/10/17 0337  WBC 13.4* 12.0*  RBC 3.82* 4.46  HGB 10.0* 11.9*  HCT 31.8* 37.5  MCV 83.2 84.1  MCH 26.2 26.7  MCHC 31.4 31.7  RDW 18.4* 18.3*  PLT 273 217    Cardiac EnzymesNo results for input(s): TROPONINI in the last 168 hours.  Recent Labs  Lab 07/09/17 1201  TROPIPOC 0.02     BNP Recent Labs  Lab 07/09/17 1146  BNP 897.3*     DDimer No results for input(s): DDIMER in the last 168 hours.  Radiology    Dg Chest Portable 1 View  Result Date: 07/09/2017 CLINICAL DATA:  Short of breath today, history of atrial fibrillation EXAM: PORTABLE CHEST 1 VIEW COMPARISON:  Chest x-ray of 12/22/2016 FINDINGS: The lungs are not well aerated. There is basilar volume loss with bilateral pleural effusions, cardiomegaly, and pulmonary vascular congestion, consistent with CHF. No pneumonia is seen. The bones appear somewhat osteopenic. IMPRESSION: 1. Poor inspiration with probable mild CHF with cardiomegaly and small pleural effusions. 2. No definite pneumonia is seen. Electronically Signed   By: Dwyane Dee M.D.   On: 07/09/2017 12:07    Cardiac Studies   Echo 12/25/2016 ------------------------------------------------------------------- LV EF: 35%  ------------------------------------------------------------------- Indications:      Dyspnea 786.09.  ------------------------------------------------------------------- Study Conclusions  - Left ventricle: The cavity size was normal. Wall thickness was   increased in a pattern of mild  LVH. Indeterminant diastolic   function (atrial fibrillation). The estimated ejection fraction   was 35%. Diffuse hypokinesis. - Aortic valve: Trileaflet; moderately calcified leaflets.   Sclerosis without stenosis. There was trivial regurgitation. - Mitral valve: Mildly calcified annulus. Mildly calcified leaflets   . There was trivial regurgitation. - Left atrium: The atrium was moderately dilated. - Right ventricle: The cavity size was mildly dilated. Systolic   function was mildly reduced. - Right atrium: The atrium was moderately dilated. - Tricuspid valve: There was moderate-severe regurgitation. Peak   RV-RA gradient (S): 66 mm Hg. - Pulmonary arteries: PA peak pressure: 69 mm Hg (S). - Inferior vena cava: The vessel was normal in size. The   respirophasic diameter changes were in the normal range (>= 50%),   consistent with normal central venous pressure. - Pericardium, extracardiac: A trivial pericardial effusion was   identified.  Impressions:  - The patient was in atrial fibrillation. Normal LV size with mild   LV hypertrophy. EF 35% with diffuse hypokinesis. Mildly dilated   RV with mildly decreased systolic function. Aortic valve   sclerosis without significant stenosis. Moderate to severe   tricuspid regurgitation. Moderate to severe pulmonary   hypertension.  Patient Profile     84 y.o. female with a hx of chronic atrial fibrillation, chronic systolic and diastolic heart failure, HTN, hypothyroidism, pulmonary HTN, and CKD stage III who is being seen today for the evaluation of CHF exacerbation and Afib at the request of Dr. Eudelia Bunch.   Assessment & Plan    1. Acute on chronic combined systolic and diastolic HF  - unclear how much is acute heart failure as her arrival weight is the same as her dry weight.   - underwent CXR this morning, appears to have bilateral pleural effusion. On 80 mg IV lasix daily. Consider add additional 40mg  IV lasix x 1 dose this  afternoon to see response. However cannot rule PNA either   2. Long standing persistent atrial fibrillation  - continue coumadin. Arrived with afib with RVR, HR improved to 70-80s on PO metoprolol and IV diltiazem. However IV diltiazem stopped due to bradycardia into the high 40s. Now HR 90s  - increase metoprolol to 50mg  QID for better rate control. Monitor for recurrent bradycardia or pause  3. Leukocytosis: UA negative  4. Pleural effusion: seen on CXR  5. CKD stage III: Cr 1.4  6. HTN  For questions or updates, please contact [facility] Please consult [email or web address] for contact info under Cardiology/STEMI.      Ramond Dial, PA  07/10/2017, 9:23 AM     History and all data  above reviewed.  Patient examined.  I agree with the findings as above. She is still SOB.  No acute distress and perhaps somewhat better than yesterday.   The patient exam reveals [id number]  ,  Lungs: Decreased breath sounds with expiratory wheezing  ,  Abd: Positive bowel sounds, no rebound no guarding, Ext Moderate edema  .  All available labs, radiology testing, previous records reviewed. Agree with documented assessment and plan. Acute respiratory decompensation:  Certainly some of this is atrial fib and volume.  However, she has decreased breath sound with some expiratory wheezing.  We will continue diuresis and defer any other treatment to the primary team.  Atrial fib:  Increase the beta blocker and continue anticoagulation.    Rollene Rotunda  9:51 AM  07/10/2017

## 2017-07-10 NOTE — ED Notes (Signed)
Attempted report x1. 

## 2017-07-10 NOTE — Progress Notes (Signed)
Patient HR was sustaining in 110-120s. Patient stated that she was not feeling well. She felt nauseous. Patient received IV zofran.  Paged on call PA for cardiology. New orders for PO cardizem 30mg  Q8hrs. Will continue to monitor closely.

## 2017-07-10 NOTE — Progress Notes (Signed)
RN checked on patient noted to have AMS. Patient was still complaining of not feeling well. She felt dizziness. HR was still in the 110-120s despite giving PO cardizem. Paged on call cardiology PA. Came up to see patient. New orders provided. Will continue to monitor.

## 2017-07-10 NOTE — Discharge Instructions (Signed)

## 2017-07-10 NOTE — Progress Notes (Signed)
[facility]                                                                              Patient Demographics  Tonya Farley, is a 84 y.o. female, DOB - 1919/05/20, ZOX:[id number]  Admit date - 07/09/2017   Admitting Physician Tyrone Nine, MD  Outpatient Primary MD for the patient is Burton Apley, MD  Outpatient specialists:   LOS - 1  days   Medical records reviewed and are as summarized below:    No chief complaint on file.      Brief summary   Patient is a 84 year old female with combined CHF, pulmonary hypertension atrial for ablation on Coumadin, CKD stage III, hypothyroidism presented with shortness of breath, probably on the morning of admission, generalized weakness over the previous few days.  Patient was noticed to be wheezing, had a negative chest x-ray at urgent care.  In ED, patient was noticed to be hypoxic, in atrial for ablation, rate 180s to 200s.  Chest x-ray showed pulmonary edema.  Cardiology was consulted.   Assessment & Plan    Principal Problem:   Acute respiratory failure with hypoxia (HCC) -Known history of CHF, echo 10/18 showed EF of 35% with diffuse hypokinesis, mild RV systolic dysfunction.  Severe pulmonary hypertension, severe TR -Likely precipitated due to acute on chronic CHF, atrial fib with RVR - SOB slightly better this morning, repeat chest x-ray showed persistent small bibasilar pleural effusion/pulmonary edema, CHF -O2 sats 97- 100% on 2 L.  Slight wheezing, placed on Xopenex TID   Active Problems:   Acute on chronic systolic heart failure (HCC) with severe pulmonary hypertension, TR -Follow 2D echo, strict I's and O's and daily weights, symptomatic improvement after Lasix -Continue Lasix 80 mg IV daily per cardiology recommendations -Negative balance of 1.0 L    Atrial fibrillation with RVR (HCC) -Cardizem drip off this morning due to sinus pauses and bradycardia in 40s -Increased metoprolol this morning,  follow closely for any recurrent bradycardia or pauses -Continue Coumadin per pharmacy, [place] vasc 5  Leukocytosis Possibly stress demargination, no fevers, improving, UA negative for UTI, chest x-ray-showed pulmonary edema consistent with CHF. -Procalcitonin less than 0.1, does not warrant any antibiotics at this time    CKD (chronic kidney disease) stage 4, GFR 15-29 ml/min (HCC) - baseline creatinine 1.9- 2 -Creatinine stable, 1.4, improving from admission, follow closely with diuresis  Acute bronchitis - For now continue Xopenex 3 times daily and PRN  Hypothyroidism Continue Synthroid, TSH normal 3.3    Code Status: DNR DVT Prophylaxis: Coumadin  Family Communication: Discussed in detail with the patient, all imaging results, lab results explained to the patient    Disposition Plan:  Time Spent in minutes   35 minutes Procedures:  None  Consultants:   Cardiology  Antimicrobials:      Medications  Scheduled Meds: . atorvastatin  10 mg Oral Daily  . brimonidine  1 drop Both Eyes TID  . furosemide  80 mg Intravenous Daily  . latanoprost  1 drop Both Eyes QHS  . levothyroxine  88 mcg Oral QAC breakfast  . mouth rinse  15  mL Mouth Rinse BID  . metoprolol tartrate  50 mg Oral Q6H  . pentoxifylline  400 mg Oral TID WC  . potassium chloride SA  20 mEq Oral Daily  . sodium chloride flush  3 mL Intravenous Q12H  . timolol  1 drop Both Eyes q morning - 10a  . Warfarin - Pharmacist Dosing Inpatient   Does not apply q1800   Continuous Infusions: . sodium chloride    . diltiazem (CARDIZEM) infusion Stopped (07/10/17 0411)   PRN Meds:.sodium chloride, acetaminophen, levalbuterol, ondansetron (ZOFRAN) IV, sodium chloride flush   Antibiotics   Anti-infectives (From admission, onward)   None        Subjective:   Tonya Farley was seen and examined today.  States shortness of breath is slightly improving from yesterday.  No chest pain, dizziness or  palpitations.  Patient denies dizziness,abdominal pain, N/V/D/C, new weakness, numbess, tingling. No fevers.  Objective:   Vitals:   07/09/17 2345 07/10/17 0030 07/10/17 0215 07/10/17 0315  BP: (!) 151/70 (!) 154/67 (!) 164/50 (!) 167/85  Pulse: 67  61 77  Resp: (!) 21 15 12 16   Temp:    97.6 F (36.4 C)  TempSrc:    Oral  SpO2: 100% 99% 100% 100%  Weight:    64.2 kg (141 lb 8.6 oz)  Height:    5\' 4"  (1.626 m)    Intake/Output Summary (Last 24 hours) at 07/10/2017 1051 Last data filed at 07/10/2017 0933 Gross per 24 hour  Intake 131.79 ml  Output 1150 ml  Net -1018.21 ml     Wt Readings from Last 3 Encounters:  07/10/17 64.2 kg (141 lb 8.6 oz)  02/27/17 63.2 kg (139 lb 6.4 oz)  01/01/17 60.2 kg (132 lb 12.8 oz)     Exam  General: Alert and oriented x 3, NAD  Eyes: HEENT:  Atraumatic, normocephalic  Cardiovascular: Irregularly irregular, no MRG   Respiratory: Decreased breath sound at the bases with scattered wheezing  Gastrointestinal: Soft, nontender, nondistended, + bowel sounds  Ext: no pedal edema bilaterally  Neuro: no new deficits  Musculoskeletal: No digital cyanosis, clubbing  Skin: No rashes  Psych: Normal affect and demeanor, alert and oriented x3    Data Reviewed:  I have personally reviewed following labs and imaging studies  Micro Results Recent Results (from the past 240 hour(s))  Respiratory Panel by PCR     Status: None   Collection Time: 07/09/17  5:36 PM  Result Value Ref Range Status   Adenovirus NOT DETECTED NOT DETECTED Final   Coronavirus 229E NOT DETECTED NOT DETECTED Final   Coronavirus HKU1 NOT DETECTED NOT DETECTED Final   Coronavirus NL63 NOT DETECTED NOT DETECTED Final   Coronavirus OC43 NOT DETECTED NOT DETECTED Final   Metapneumovirus NOT DETECTED NOT DETECTED Final   Rhinovirus / Enterovirus NOT DETECTED NOT DETECTED Final   Influenza A NOT DETECTED NOT DETECTED Final   Influenza B NOT DETECTED NOT DETECTED Final    Parainfluenza Virus 1 NOT DETECTED NOT DETECTED Final   Parainfluenza Virus 2 NOT DETECTED NOT DETECTED Final   Parainfluenza Virus 3 NOT DETECTED NOT DETECTED Final   Parainfluenza Virus 4 NOT DETECTED NOT DETECTED Final   Respiratory Syncytial Virus NOT DETECTED NOT DETECTED Final   Bordetella pertussis NOT DETECTED NOT DETECTED Final   Chlamydophila pneumoniae NOT DETECTED NOT DETECTED Final   Mycoplasma pneumoniae NOT DETECTED NOT DETECTED Final    Comment: Performed at [facility], [address],  [place], [place] [address]  MRSA PCR Screening     Status: None   Collection Time: 07/10/17  3:13 AM  Result Value Ref Range Status   MRSA by PCR NEGATIVE NEGATIVE Final    Comment:        The GeneXpert MRSA Assay (FDA approved for NASAL specimens only), is one component of a comprehensive MRSA colonization surveillance program. It is not intended to diagnose MRSA infection nor to guide or monitor treatment for MRSA infections. Performed at [facility], [address], [place], [place] [address]     Radiology Reports Dg Chest 2 View  Result Date: 07/10/2017 CLINICAL DATA:  Acute on chronic systolic heart failure EXAM: CHEST - 2 VIEW COMPARISON:  07/09/2017 FINDINGS: Enlargement of cardiac silhouette with pulmonary vascular congestion. Atherosclerotic calcification aorta. Persistent interstitial infiltrates consistent with pulmonary edema. Bibasilar effusions and atelectasis. No pneumothorax. Bones demineralized. IMPRESSION: CHF with persistent small bibasilar pleural effusions and atelectasis. Electronically Signed   By: Ulyses Southward M.D.   On: 07/10/2017 09:47   Dg Chest Portable 1 View  Result Date: 07/09/2017 CLINICAL DATA:  Short of breath today, history of atrial fibrillation EXAM: PORTABLE CHEST 1 VIEW COMPARISON:  Chest x-ray of 12/22/2016 FINDINGS: The lungs are not well aerated. There is basilar volume loss with bilateral pleural effusions, cardiomegaly, and  pulmonary vascular congestion, consistent with CHF. No pneumonia is seen. The bones appear somewhat osteopenic. IMPRESSION: 1. Poor inspiration with probable mild CHF with cardiomegaly and small pleural effusions. 2. No definite pneumonia is seen. Electronically Signed   By: Dwyane Dee M.D.   On: 07/09/2017 12:07    Lab Data:  CBC: Recent Labs  Lab 07/09/17 1146 07/10/17 0337  WBC 13.4* 12.0*  HGB 10.0* 11.9*  HCT 31.8* 37.5  MCV 83.2 84.1  PLT 273 217   Basic Metabolic Panel: Recent Labs  Lab 07/09/17 1146 07/10/17 0337  NA 142 142  K 3.6 3.4*  CL 107 106  CO2 23 24  GLUCOSE 128* 109*  BUN 26* 26*  CREATININE 1.53* 1.42*  CALCIUM 9.3 9.2  MG 2.1  --    GFR: Estimated Creatinine Clearance: 20.5 mL/min (A) (by C-G formula based on SCr of 1.42 mg/dL (H)). Liver Function Tests: Recent Labs  Lab 07/09/17 1146  AST 24  ALT 20  ALKPHOS 73  BILITOT 1.1  PROT 6.6  ALBUMIN 3.5   No results for input(s): LIPASE, AMYLASE in the last 168 hours. No results for input(s): AMMONIA in the last 168 hours. Coagulation Profile: Recent Labs  Lab 07/09/17 1146 07/10/17 0337  INR 2.70 2.82   Cardiac Enzymes: No results for input(s): CKTOTAL, CKMB, CKMBINDEX, TROPONINI in the last 168 hours. BNP (last 3 results) No results for input(s): PROBNP in the last 8760 hours. HbA1C: No results for input(s): HGBA1C in the last 72 hours. CBG: No results for input(s): GLUCAP in the last 168 hours. Lipid Profile: No results for input(s): CHOL, HDL, LDLCALC, TRIG, CHOLHDL, LDLDIRECT in the last 72 hours. Thyroid Function Tests: Recent Labs    07/09/17 1923  TSH 3.384   Anemia Panel: No results for input(s): VITAMINB12, FOLATE, FERRITIN, TIBC, IRON, RETICCTPCT in the last 72 hours. Urine analysis:    Component Value Date/Time   COLORURINE YELLOW 07/09/2017 1736   APPEARANCEUR CLEAR 07/09/2017 1736   LABSPEC 1.010 07/09/2017 1736   PHURINE 6.0 07/09/2017 1736   GLUCOSEU  NEGATIVE 07/09/2017 1736   HGBUR NEGATIVE 07/09/2017 1736   BILIRUBINUR NEGATIVE 07/09/2017  1736   KETONESUR NEGATIVE 07/09/2017 1736   PROTEINUR 30 (A) 07/09/2017 1736   UROBILINOGEN 1.0 01/10/2015 2237   NITRITE NEGATIVE 07/09/2017 1736   LEUKOCYTESUR MODERATE (A) 07/09/2017 1736     Ripudeep Rai M.D. [facility] 07/10/2017, 10:51 AM  Pager: [phone] Between 7am to 7pm - call Pager - [phone]  After 7pm go to [email or web address] - password [email or web address]  Call night coverage person covering after 7pm

## 2017-07-10 NOTE — Progress Notes (Signed)
ANTICOAGULATION CONSULT NOTE - Pharmacy Consult for warfarin Indication: atrial fibrillation  Allergies  Allergen Reactions  . Penicillins Hives and Swelling    Has patient had a PCN reaction causing immediate rash, facial/tongue/throat swelling, SOB or lightheadedness with hypotension: Yes Has patient had a PCN reaction causing severe rash involving mucus membranes or skin necrosis: Yes Has patient had a PCN reaction that required hospitalization No Has patient had a PCN reaction occurring within the last 10 years: No If all of the above answers are "NO", then may proceed with Cephalosporin use.    . Sulfa Antibiotics Anaphylaxis and Swelling  . Fish Allergy Nausea And Vomiting    Patient Measurements: Height: _0  (162.6 cm) Weight: 141 lb 8.6 oz (64.2 kg) IBW/kg (Calculated) : 54.7  Vital Signs: Temp: 97.6 F (36.4 C) (04/18 1140) Temp Source: Oral (04/18 1140) BP: 146/88 (04/18 1140) Pulse Rate: 111 (04/18 1140)  Labs: Recent Labs    07/09/17 1146 07/10/17 0337  HGB 10.0* 11.9*  HCT 31.8* 37.5  PLT 273 217  LABPROT 28.5* 29.4*  INR 2.70 2.82  CREATININE 1.53* 1.42*    Estimated Creatinine Clearance: 20.5 mL/min (A) (by C-G formula based on SCr of 1.42 mg/dL (H)).   Medical History: Past Medical History:  Diagnosis Date  . Chronic a-fib ([place])   . Chronic back pain   . Chronic diastolic (congestive) heart failure (HCC)    a. Echo 3/17: mild LVH, EF 55%, no RWMA, mod AS (mean 10 mmHg, peak 21 mmHg), MAC, mild MR, severe LAE, mod reduced RVSF, mild RAE, mod TR, PASP 65 mmHg  . CKD (chronic kidney disease) stage 4, GFR 15-29 ml/min (HCC) 09/11/2015  . Hyperlipidemia   . Hypertension   . Hypothyroidism   . Pulmonary hypertension (HCC)    PASP 65 mmHg at Echo 3/17  . Thyroid disease     Medications:  Scheduled:  . atorvastatin  10 mg Oral Daily  . brimonidine  1 drop Both Eyes TID  . furosemide  80 mg Intravenous Daily  . latanoprost  1 drop Both Eyes  QHS  . levalbuterol  0.63 mg Nebulization TID  . levothyroxine  88 mcg Oral QAC breakfast  . mouth rinse  15 mL Mouth Rinse BID  . metoprolol tartrate  50 mg Oral Q6H  . pentoxifylline  400 mg Oral TID WC  . potassium chloride SA  20 mEq Oral Daily  . sodium chloride flush  3 mL Intravenous Q12H  . timolol  1 drop Both Eyes q morning - 10a  . Warfarin - Pharmacist Dosing Inpatient   Does not apply q1800    Assessment: 84 yo female presented to ED on 4/17 with shortness of breath, probably on the morning of admission and generalized weakness over the previous few days. She has a history of chronic atrial fibrillation on warfarin. Pharmacy consulted 4/17 to dose warfarin. Home dose is 2.5 mg daily. INR on admission was therapeutic at 2.7. Last dose pta was 4/16.  Today INR remains therapeutic at 2.82.  Hgb 11.9 low/stable and pltc wnl  No bleeding noted.  Goal of Therapy:  INR 2-3 Monitor platelets by anticoagulation protocol: Yes   Plan:  Continue home dosage of Warfarin 2.5 mg daily at 18:00.  Daily INR  Thank you for allowing pharmacy to be part of this patients care team. Nicole Cella, [facility] Clinical Pharmacist Pager: [phone] or [phone] (330p-1030p) Main Rx [phone] 07/10/2017,1:06 PM

## 2017-07-10 NOTE — Progress Notes (Signed)
Paged cardiology to let them know cardizem gtt was stopped for HR less than 60 and greater than 2 second pause. Will continue to monitor.

## 2017-07-10 NOTE — Procedures (Signed)
Heart rate is too high for echo at 3:20pm 4/18.  Will attempt again on 4/19.

## 2017-07-10 NOTE — Progress Notes (Addendum)
Patient is having acute altered mental status. On arrival, patient is alert, complain of nausea and not feeling well. Today's CXR continue to acute heart failure. Morning BMET showed low K, will given additional KCl and change KCL to 20meq BID. Obtain stat labs include ABG, CBC and BMET. Will stop PO diltiazem and change to IV diltiazem for better rate control. Vital stable, SBP in 140s. I do not think her HR is responsible for her AMS. May need CT of brain. Although she is not complaining of SOB, her exam continue to show markedly diminished breath sound in bilateral bases.  Plan Obtain CBC, BMET, ABG. She does not appears to be dyspneic Replete K D/C Po diltiazem, change to IV diltiazem Hospitalist to consider move her to stepdown and obtain CD of head.  Give 40mg  IV lasix. Follow K closely Paged hospitalist, waiting page back Suspicion for occlusive CVA is low given therapeutic INR, although cannot rule out hemorrhagic CVA, though her weakness seems to be generalized and not focal.  Patient seen by Dr. Delton SeeNelson, will obtain brain CT   Signed, Azalee CourseHao Mariena Meares PA Pager: [phone]

## 2017-07-11 ENCOUNTER — Other Ambulatory Visit: Payer: Self-pay | Admitting: Interventional Cardiology

## 2017-07-11 ENCOUNTER — Inpatient Hospital Stay (HOSPITAL_COMMUNITY): Payer: Medicare Other

## 2017-07-11 DIAGNOSIS — M25461 Effusion, right knee: Secondary | ICD-10-CM

## 2017-07-11 DIAGNOSIS — M25561 Pain in right knee: Secondary | ICD-10-CM

## 2017-07-11 DIAGNOSIS — I340 Nonrheumatic mitral (valve) insufficiency: Secondary | ICD-10-CM

## 2017-07-11 LAB — BASIC METABOLIC PANEL
ANION GAP: 13 (ref 5–15)
BUN: 25 mg/dL — AB (ref 6–20)
CHLORIDE: 106 mmol/L (ref 101–111)
CO2: 24 mmol/L (ref 22–32)
Calcium: 9.3 mg/dL (ref 8.9–10.3)
Creatinine, Ser: 1.49 mg/dL — ABNORMAL HIGH (ref 0.44–1.00)
GFR calc Af Amer: 33 mL/min — ABNORMAL LOW (ref >60)
GFR calc non Af Amer: 29 mL/min — ABNORMAL LOW (ref >60)
GLUCOSE: 132 mg/dL — AB (ref 65–99)
POTASSIUM: 4.2 mmol/L (ref 3.5–5.1)
Sodium: 143 mmol/L (ref 135–145)

## 2017-07-11 LAB — URINE CULTURE: Culture: 50000 — AB

## 2017-07-11 LAB — ECHOCARDIOGRAM COMPLETE
Height: 64 in
Weight: 2222.24 [oz_av]

## 2017-07-11 LAB — SEDIMENTATION RATE: SED RATE: 43 mm/h — AB (ref 0–22)

## 2017-07-11 LAB — SYNOVIAL CELL COUNT + DIFF, W/ CRYSTALS
EOSINOPHILS-SYNOVIAL: 0 % (ref 0–1)
Lymphocytes-Synovial Fld: 0 % (ref 0–20)
Monocyte-Macrophage-Synovial Fluid: 5 % — ABNORMAL LOW (ref 50–90)
Neutrophil, Synovial: 95 % — ABNORMAL HIGH (ref 0–25)
WBC, Synovial: 40250 /mm3 — ABNORMAL HIGH (ref 0–200)

## 2017-07-11 LAB — PROTIME-INR
INR: 3.27
Prothrombin Time: 33.1 seconds — ABNORMAL HIGH (ref 11.4–15.2)

## 2017-07-11 LAB — C-REACTIVE PROTEIN: CRP: 10.1 mg/dL — ABNORMAL HIGH (ref <1.0)

## 2017-07-11 MED ORDER — DILTIAZEM HCL 60 MG PO TABS
30.0000 mg | ORAL_TABLET | Freq: Four times a day (QID) | ORAL | Status: AC
  Administered 2017-07-11 – 2017-07-12 (×6): 30 mg via ORAL
  Filled 2017-07-11 (×6): qty 1

## 2017-07-11 MED ORDER — FUROSEMIDE 40 MG PO TABS
40.0000 mg | ORAL_TABLET | Freq: Two times a day (BID) | ORAL | Status: DC
  Administered 2017-07-12 – 2017-07-13 (×3): 40 mg via ORAL
  Filled 2017-07-11 (×3): qty 1

## 2017-07-11 MED ORDER — DILTIAZEM HCL 60 MG PO TABS: 30.0000 mg | ORAL_TABLET | Freq: Four times a day (QID) | ORAL | Status: DC

## 2017-07-11 MED ORDER — METHYLPREDNISOLONE ACETATE 80 MG/ML IJ SUSP
80.0000 mg | Freq: Once | INTRAMUSCULAR | Status: DC
  Filled 2017-07-11: qty 1

## 2017-07-11 MED ORDER — LIDOCAINE HCL (PF) 1 % IJ SOLN
30.0000 mL | Freq: Once | INTRAMUSCULAR | Status: DC
  Filled 2017-07-11: qty 30

## 2017-07-11 NOTE — Progress Notes (Signed)
ANTICOAGULATION CONSULT NOTE - Pharmacy Consult for warfarin Indication: atrial fibrillation  Allergies  Allergen Reactions  . Penicillins Hives and Swelling    Has patient had a PCN reaction causing immediate rash, facial/tongue/throat swelling, SOB or lightheadedness with hypotension: Yes Has patient had a PCN reaction causing severe rash involving mucus membranes or skin necrosis: Yes Has patient had a PCN reaction that required hospitalization No Has patient had a PCN reaction occurring within the last 10 years: No If all of the above answers are "NO", then may proceed with Cephalosporin use.    . Sulfa Antibiotics Anaphylaxis and Swelling  . Fish Allergy Nausea And Vomiting    Patient Measurements: Height: _0  (162.6 cm) Weight: 138 lb 14.2 oz (63 kg) IBW/kg (Calculated) : 54.7  Vital Signs: Temp: 98.7 F (37.1 C) (04/19 0743) Temp Source: Oral (04/19 0743) BP: 154/64 (04/19 0743) Pulse Rate: 64 (04/19 0743)  Labs: Recent Labs    07/09/17 1146 07/10/17 0337 07/10/17 2011 07/11/17 0312  HGB 10.0* 11.9* 12.5  --   HCT 31.8* 37.5 40.4  --   PLT 273 217 236  --   LABPROT 28.5* 29.4*  --  33.1*  INR 2.70 2.82  --  3.27  CREATININE 1.53* 1.42* 1.45* 1.49*    Estimated Creatinine Clearance: 19.5 mL/min (A) (by C-G formula based on SCr of 1.49 mg/dL (H)).   Medical History: Past Medical History:  Diagnosis Date  . Chronic a-fib ([place])   . Chronic back pain   . Chronic diastolic (congestive) heart failure (HCC)    a. Echo 3/17: mild LVH, EF 55%, no RWMA, mod AS (mean 10 mmHg, peak 21 mmHg), MAC, mild MR, severe LAE, mod reduced RVSF, mild RAE, mod TR, PASP 65 mmHg  . CKD (chronic kidney disease) stage 4, GFR 15-29 ml/min (HCC) 09/11/2015  . Hyperlipidemia   . Hypertension   . Hypothyroidism   . Pulmonary hypertension (HCC)    PASP 65 mmHg at Echo 3/17  . Thyroid disease     Medications:  Scheduled:  . atorvastatin  10 mg Oral Daily  . brimonidine  1  drop Both Eyes TID  . furosemide  80 mg Intravenous Daily  . latanoprost  1 drop Both Eyes QHS  . levalbuterol  0.63 mg Nebulization TID  . levothyroxine  88 mcg Oral QAC breakfast  . mouth rinse  15 mL Mouth Rinse BID  . metoprolol tartrate  50 mg Oral Q6H  . pentoxifylline  400 mg Oral TID WC  . potassium chloride SA  20 mEq Oral BID  . sodium chloride flush  3 mL Intravenous Q12H  . timolol  1 drop Both Eyes q morning - 10a  . warfarin  2.5 mg Oral q1800  . Warfarin - Pharmacist Dosing Inpatient   Does not apply q1800   Assessment: 84 yo female presented to ED on 4/17 with shortness of breath, probably on the morning of admission and generalized weakness over the previous few days.  She has a history of chronic atrial fibrillation on warfarin. Pharmacy consulted 4/17 to dose warfarin. Home dose is 2.5 mg daily. INR on admission was therapeutic at 2.7. Last dose pta was 4/16.  Today INR has bumped slightly to 3.27 and is just above desired goal range.  No CBC today but this has been stable.  No bleeding noted.  Goal of Therapy:  INR 2-3 Monitor platelets by anticoagulation protocol: Yes   Plan:  Hold Warfarin today - f/u AM labs.  Daily INR  Rober Minion, PharmD., MS Clinical Pharmacist Pager:  [phone] Thank you for allowing pharmacy to be part of this patients care team. 07/11/2017,9:21 AM

## 2017-07-11 NOTE — Progress Notes (Signed)
Pharmacist Heart Failure Core Measure Documentation  Assessment: Tonya MoatsMamie N Farley has an EF documented as 35-40 on 07/11/17 by echo.  Rationale: Heart failure patients with left ventricular systolic dysfunction (LVSD) and an EF < 40% should be prescribed an angiotensin converting enzyme inhibitor (ACEI) or angiotensin receptor blocker (ARB) at discharge unless a contraindication is documented in the medical record.  This patient is not currently on an ACEI or ARB for HF.  This note is being placed in the record in order to provide documentation that a contraindication to the use of these agents is present for this encounter.  ACE Inhibitor or Angiotensin Receptor Blocker is contraindicated (specify all that apply)  []   ACEI allergy AND ARB allergy []   Angioedema []   Moderate or severe aortic stenosis []   Hyperkalemia []   Hypotension []   Renal artery stenosis [x]   Worsening renal function, preexisting renal disease or dysfunction   Pang Robers 07/11/2017 2:43 PM

## 2017-07-11 NOTE — Telephone Encounter (Signed)
Patient has been prescribed imdur by Dr. Eldridge DaceVaranasi in the past, however, patient is currently in the hospital. Will not refill at this time. Will wait and let the hospital determine meds for patient at this time.

## 2017-07-11 NOTE — Progress Notes (Signed)
Dr. Butler Denmarkizwan paged and made aware of Pt IV site infiltration while on cardizem drip infusing at 5 ml /hr and IV team unable to restart IV using ultrasound. Received orders to convert to oral diltiazem 30 ml now .then every 6 hours; to hold for HR < 60. Also notified of Pt receiving daily IV furosemide with dose covered for today. Stated will address in morning. Ayab Ruthe MannanMawule Willma Obando, BSN, RN

## 2017-07-11 NOTE — Progress Notes (Signed)
Pt complaining of right leg pain. Upon assessment, right knee was swollen and warm when compared to left. Night MD coverage made aware and will inform attending MD to assess in morning.

## 2017-07-11 NOTE — Telephone Encounter (Signed)
Pt's pharmacy is requesting a refill on isosorbide mononitrate 30 mg tablet. Dr. Eldridge DaceVaranasi did not prescribe this medication. Would Dr. Eldridge DaceVaranasi like to refill this medication? Please address

## 2017-07-11 NOTE — Consult Note (Signed)
ORTHOPAEDIC CONSULTATION  REQUESTING PHYSICIAN: Debbe Odea, MD  PCP:  Lorene Dy, MD  Chief Complaint: Right knee pain and swelling  HPI: Tonya Farley is a 84 y.o. female who complains of right knee pain and swelling since yesterday.  She was admitted to the hospital 2 days ago for A. fib with RVR.  She is on chronic Coumadin.  Patient states that she thinks she has had gout before in her foot.  No history of knee surgery.  X-rays revealed mild arthritic change and chondrocalcinosis.  Past Medical History:  Diagnosis Date  . Chronic a-fib ([place])   . Chronic back pain   . Chronic diastolic (congestive) heart failure (HCC)    a. Echo 3/17: mild LVH, EF 55%, no RWMA, mod AS (mean 10 mmHg, peak 21 mmHg), MAC, mild MR, severe LAE, mod reduced RVSF, mild RAE, mod TR, PASP 65 mmHg  . CKD (chronic kidney disease) stage 4, GFR 15-29 ml/min (HCC) 09/11/2015  . Hyperlipidemia   . Hypertension   . Hypothyroidism   . Pulmonary hypertension (HCC)    PASP 65 mmHg at Echo 3/17  . Thyroid disease    Past Surgical History:  Procedure Laterality Date  . APPENDECTOMY    . cataracts     bilateral  . EYE SURGERY     left eye "hole"  . TONSILLECTOMY     Social History   Socioeconomic History  . Marital status: Widowed    Spouse name: Not on file  . Number of children: Not on file  . Years of education: Not on file  . Highest education level: Not on file  Occupational History  . Not on file  Social Needs  . Financial resource strain: Not on file  . Food insecurity:    Worry: Not on file    Inability: Not on file  . Transportation needs:    Medical: Not on file    Non-medical: Not on file  Tobacco Use  . Smoking status: Never Smoker  . Smokeless tobacco: Never Used  Substance and Sexual Activity  . Alcohol use: No  . Drug use: No  . Sexual activity: Not on file  Lifestyle  . Physical activity:    Days per week: Not on file    Minutes per session: Not on file  .  Stress: Not on file  Relationships  . Social connections:    Talks on phone: Not on file    Gets together: Not on file    Attends religious service: Not on file    Active member of club or organization: Not on file    Attends meetings of clubs or organizations: Not on file    Relationship status: Not on file  Other Topics Concern  . Not on file  Social History Narrative  . Not on file   Family History  Problem Relation Age of Onset  . Heart disease Sister   . Heart disease Sister   . Cancer Mother   . Other Father   . Heart attack Father    Allergies  Allergen Reactions  . Penicillins Hives and Swelling    Has patient had a PCN reaction causing immediate rash, facial/tongue/throat swelling, SOB or lightheadedness with hypotension: Yes Has patient had a PCN reaction causing severe rash involving mucus membranes or skin necrosis: Yes Has patient had a PCN reaction that required hospitalization No Has patient had a PCN reaction occurring within the last 10 years: No If all of the  above answers are "NO", then may proceed with Cephalosporin use.    . Sulfa Antibiotics Anaphylaxis and Swelling  . Fish Allergy Nausea And Vomiting   Prior to Admission medications   Medication Sig Start Date End Date Taking? Authorizing Provider  albuterol (PROVENTIL) (2.5 MG/3ML) 0.083% nebulizer solution Take 3 mLs (2.5 mg total) by nebulization every 6 (six) hours as needed for wheezing or shortness of breath. 07/05/17  Yes Hagler, Aaron Edelman, MD  atorvastatin (LIPITOR) 10 MG tablet Take 10 mg by mouth daily.   Yes [provider]  B Complex Vitamins (B COMPLEX 1 PO) Take 1 tablet by mouth daily.   Yes [provider]  brimonidine (ALPHAGAN) 0.15 % ophthalmic solution Place 1 drop into both eyes 3 (three) times daily.   Yes [provider]  cholecalciferol (VITAMIN D) 400 units TABS tablet Take 400 Units by mouth daily.   Yes [provider]  diltiazem (CARDIZEM CD)  180 MG 24 hr capsule Take 1 capsule (180 mg total) by mouth daily. 01/01/17 07/09/17 Yes Burtis Junes, NP  feeding supplement (BOOST / RESOURCE BREEZE) LIQD Take 1 Container by mouth 2 (two) times daily between meals. 12/27/16  Yes Ghimire, Henreitta Leber, MD  furosemide (LASIX) 80 MG tablet TAKE 1 TABLET BY MOUTH EVERY DAY 04/28/17  Yes Jettie Booze, MD  isosorbide mononitrate (IMDUR) 30 MG 24 hr tablet Take 1 tablet (30 mg total) by mouth daily. 12/27/16 07/09/17 Yes Ghimire, Henreitta Leber, MD  labetalol (NORMODYNE) 200 MG tablet Take 1 tablet (200 mg total) by mouth 3 (three) times daily. TAKE 1 TABLET(200 MG) BY MOUTH TWICE DAILY 12/27/16  Yes Ghimire, Henreitta Leber, MD  latanoprost (XALATAN) 0.005 % ophthalmic solution Place 1 drop into both eyes at bedtime. 01/10/14  Yes [provider]  levothyroxine (SYNTHROID, LEVOTHROID) 88 MCG tablet Take 88 mcg by mouth daily before breakfast.   Yes [provider]  metolazone (ZAROXOLYN) 2.5 MG tablet Take 1 tablet (2.5 mg total) by mouth as needed (take if weight goes up 3 lbs in 1 day or 5 lbs in 1 week). Patient taking differently: Take 2.5 mg by mouth as needed (take if weight goes up 3 lbs in 1 day or 5 lbs in 1 week or fluid).  10/27/15  Yes Weaver, Scott T, PA-C  ondansetron (ZOFRAN-ODT) 4 MG disintegrating tablet Take 1 tablet (4 mg total) by mouth every 8 (eight) hours as needed for nausea or vomiting. 09/17/16  Yes Davonna Belling, MD  pentoxifylline (TRENTAL) 400 MG CR tablet Take 400 mg by mouth 3 (three) times daily with meals.   Yes [provider]  Polyethyl Glycol-Propyl Glycol (SYSTANE OP) Apply 2 drops to eye daily as needed (dry eyes).    Yes [provider]  potassium chloride SA (K-DUR,KLOR-CON) 20 MEQ tablet Take 20 mEq by mouth daily.   Yes [provider]  timolol (TIMOPTIC) 0.5 % ophthalmic solution Place 1 drop into both eyes every morning.  12/28/13  Yes [provider]  warfarin  (COUMADIN) 1 MG tablet TAKE 2.5 OR 3 TABLETS AS DIRECTED BY COUMADIN CLINIC Patient taking differently: 2.60m by mouth once daily 05/12/17  Yes VJettie Booze MD   Dg Chest 2 View  Result Date: 07/10/2017 CLINICAL DATA:  Acute on chronic systolic heart failure EXAM: CHEST - 2 VIEW COMPARISON:  07/09/2017 FINDINGS: Enlargement of cardiac silhouette with pulmonary vascular congestion. Atherosclerotic calcification aorta. Persistent interstitial infiltrates consistent with pulmonary edema. Bibasilar effusions and atelectasis.  No pneumothorax. Bones demineralized. IMPRESSION: CHF with persistent small bibasilar pleural effusions and atelectasis. Electronically Signed   By: Lavonia Dana M.D.   On: 07/10/2017 09:47   Ct Head Wo Contrast  Result Date: 07/10/2017 CLINICAL DATA:  Altered level of consciousness. Hypoxia. History of hypertension, hyperlipidemia, atrial fibrillation. EXAM: CT HEAD WITHOUT CONTRAST TECHNIQUE: Contiguous axial images were obtained from the base of the skull through the vertex without intravenous contrast. COMPARISON:  CT HEAD December 22, 2016 FINDINGS: BRAIN: No intraparenchymal hemorrhage, mass effect nor midline shift. The ventricles and sulci are normal for age. Minimal supratentorial white matter hypodensities less than expected for patient's age, though non-specific are most compatible with chronic small vessel ischemic disease. No acute large vascular territory infarcts. No abnormal extra-axial fluid collections. Basal cisterns are patent. VASCULAR: Moderate calcific atherosclerosis of the carotid siphons. SKULL: No skull fracture. Osteopenia. No significant scalp soft tissue swelling. SINUSES/ORBITS: Trace chronic mastoid effusions. Paranasal sinuses are well aerated.The included ocular globes and orbital contents are non-suspicious. Status post bilateral ocular lens implants. OTHER: Patient is edentulous. IMPRESSION: Negative noncontrast CT HEAD for age. Electronically  Signed   By: Elon Alas M.D.   On: 07/10/2017 22:11   Dg Knee Complete 4 Views Right  Result Date: 07/11/2017 CLINICAL DATA:  Right knee pain and swelling Status post right knee aspiration EXAM: RIGHT KNEE - COMPLETE 4+ VIEW COMPARISON:  None. FINDINGS: Vascular calcifications. Mild 3 compartment osteoarthritis, with subchondral sclerosis and osteophyte formation. Chondrocalcinosis within the lateral and medial menisci. Small suprapatellar joint effusion. IMPRESSION: Degenerative change, without acute osseous finding. Chondrocalcinosis in the menisci, consistent with calcium pyrophosphate deposition disease. Small suprapatellar joint effusion. Electronically Signed   By: Abigail Miyamoto M.D.   On: 07/11/2017 20:01    CBC Latest Ref Rng & Units 07/10/2017 07/10/2017 07/09/2017  WBC 4.0 - 10.5 K/uL 16.6(H) 12.0(H) 13.4(H)  Hemoglobin 12.0 - 15.0 g/dL 12.5 11.9(L) 10.0(L)  Hematocrit 36.0 - 46.0 % 40.4 37.5 31.8(L)  Platelets 150 - 400 K/uL 236 217 273    Positive ROS: All other systems have been reviewed and were otherwise negative with the exception of those mentioned in the HPI and as above.  Physical Exam: General: Alert, no acute distress Cardiovascular: No pedal edema Respiratory: No cyanosis, no use of accessory musculature GI: No organomegaly, abdomen is soft and non-tender Skin: No lesions in the area of chief complaint Neurologic: Sensation intact distally Psychiatric: Patient is competent for consent with normal mood and affect Lymphatic: No axillary or cervical lymphadenopathy  MUSCULOSKELETAL: Examination of the right knee reveals no skin wounds or lesions.  She does have an effusion.  No warmth or erythema.  She flexes down to 90 degrees, with further flexion limited by pain.  No extensor lag.  She is neurovascular intact distally.  Assessment: Right knee effusion, likely gout.  Plan: I discussed the findings with the patient and her family.  We reviewed her x-ray  findings.  I recommended right knee aspiration.  After verbal consent was obtained, I prepped the knee with chloraprep solution. I anesthetized the skin and subcutaneous tissues of the right knee with 5 cc of 1% plain lidocaine.  Then using sterile technique, I inserted an 18-gauge needle into the suprapatellar pouch of the right knee.  I aspirated 10 cc of straw-colored, hazy joint fluid which I sent to the lab for stat cell count with differential, crystal ID, Gram stain, culture.  I think she most likely has pseudogout.  Plan for right  knee intra-articular cortisone injection tomorrow once septic arthritis has been ruled out.  I have ordered sed rate and C-reactive protein.    Bertram Savin, MD Cell [phone]    07/11/2017 8:27 PM

## 2017-07-11 NOTE — Progress Notes (Addendum)
Progress Note  Patient Name: Tonya Farley Date of Encounter: 07/11/2017  Primary Cardiologist: Lance Muss, MD   Subjective   Not quite back to baseline but feels like her confusion has resolved from yesterday evening. She notes R posterior knee/upper calf pain this morning which started yesterday evening. No prior surgeries to R leg/knee. Denies CP, SOB, or palpitations.   Inpatient Medications    Scheduled Meds: . atorvastatin  10 mg Oral Daily  . brimonidine  1 drop Both Eyes TID  . furosemide  80 mg Intravenous Daily  . latanoprost  1 drop Both Eyes QHS  . levalbuterol  0.63 mg Nebulization TID  . levothyroxine  88 mcg Oral QAC breakfast  . mouth rinse  15 mL Mouth Rinse BID  . metoprolol tartrate  50 mg Oral Q6H  . pentoxifylline  400 mg Oral TID WC  . potassium chloride SA  20 mEq Oral BID  . sodium chloride flush  3 mL Intravenous Q12H  . timolol  1 drop Both Eyes q morning - 10a  . warfarin  2.5 mg Oral q1800  . Warfarin - Pharmacist Dosing Inpatient   Does not apply q1800   Continuous Infusions: . sodium chloride    . diltiazem (CARDIZEM) infusion 5 mg/hr (07/11/17 0655)   PRN Meds: sodium chloride, acetaminophen, levalbuterol, ondansetron (ZOFRAN) IV, sodium chloride flush   Vital Signs    Vitals:   07/10/17 2043 07/11/17 0000 07/11/17 0347 07/11/17 0743  BP:  (!) 142/75 (!) 153/93 (!) 154/64  Pulse: (!) 112 90 (!) 102 64  Resp: 20 20 20 13   Temp:  97.6 F (36.4 C) 98.3 F (36.8 C) 98.7 F (37.1 C)  TempSrc:  Axillary Axillary Oral  SpO2: 99% 97% 96% 91%  Weight:   138 lb 14.2 oz (63 kg)   Height:        Intake/Output Summary (Last 24 hours) at 07/11/2017 0829 Last data filed at 07/11/2017 0655 Gross per 24 hour  Intake 147.58 ml  Output 950 ml  Net -802.42 ml   Filed Weights   07/09/17 1121 07/10/17 0315 07/11/17 0347  Weight: 139 lb (63 kg) 141 lb 8.6 oz (64.2 kg) 138 lb 14.2 oz (63 kg)    Telemetry    Atrial fibrillation with  rate in the 80s-90s and occasional PVCs this morning. Brief episode of NSVT (5 beats) yesterday. No pauses or missed beats noted. - Personally Reviewed  Physical Exam   GEN: Sitting upright in bed in no acute distress.   Neck: No JVD, no carotid bruits Cardiac: IRRR, no murmurs, rubs, or gallops.  Respiratory: crackles at bases bilaterally GI: NABS, Soft, nontender, non-distended  MS: No LE edema; R knee warm to touch, no erythema, TTP of posterior knee/upper calf Neuro:  Nonfocal, moving all extremities spontaneously Psych: Normal affect   Labs    Chemistry Recent Labs  Lab 07/09/17 1146 07/10/17 0337 07/10/17 2011 07/11/17 0312  NA 142 142 144 143  K 3.6 3.4* 4.2 4.2  CL 107 106 106 106  CO2 23 24 23 24   GLUCOSE 128* 109* 117* 132*  BUN 26* 26* 25* 25*  CREATININE 1.53* 1.42* 1.45* 1.49*  CALCIUM 9.3 9.2 9.5 9.3  PROT 6.6  --  6.3*  --   ALBUMIN 3.5  --  3.4*  --   AST 24  --  24  --   ALT 20  --  17  --   ALKPHOS 73  --  73  --  BILITOT 1.1  --  1.5*  --   GFRNONAA 28* 30* 30* 29*  GFRAA 32* 35* 34* 33*  ANIONGAP 12 12 15 13      Hematology Recent Labs  Lab 07/09/17 1146 07/10/17 0337 07/10/17 2011  WBC 13.4* 12.0* 16.6*  RBC 3.82* 4.46 4.85  HGB 10.0* 11.9* 12.5  HCT 31.8* 37.5 40.4  MCV 83.2 84.1 83.3  MCH 26.2 26.7 25.8*  MCHC 31.4 31.7 30.9  RDW 18.4* 18.3* 17.9*  PLT 273 217 236    Cardiac EnzymesNo results for input(s): TROPONINI in the last 168 hours.  Recent Labs  Lab 07/09/17 1201  TROPIPOC 0.02     BNP Recent Labs  Lab 07/09/17 1146  BNP 897.3*     DDimer No results for input(s): DDIMER in the last 168 hours.   Radiology    Dg Chest 2 View  Result Date: 07/10/2017 CLINICAL DATA:  Acute on chronic systolic heart failure EXAM: CHEST - 2 VIEW COMPARISON:  07/09/2017 FINDINGS: Enlargement of cardiac silhouette with pulmonary vascular congestion. Atherosclerotic calcification aorta. Persistent interstitial infiltrates consistent  with pulmonary edema. Bibasilar effusions and atelectasis. No pneumothorax. Bones demineralized. IMPRESSION: CHF with persistent small bibasilar pleural effusions and atelectasis. Electronically Signed   By: Ulyses Southward M.D.   On: 07/10/2017 09:47   Ct Head Wo Contrast  Result Date: 07/10/2017 CLINICAL DATA:  Altered level of consciousness. Hypoxia. History of hypertension, hyperlipidemia, atrial fibrillation. EXAM: CT HEAD WITHOUT CONTRAST TECHNIQUE: Contiguous axial images were obtained from the base of the skull through the vertex without intravenous contrast. COMPARISON:  CT HEAD December 22, 2016 FINDINGS: BRAIN: No intraparenchymal hemorrhage, mass effect nor midline shift. The ventricles and sulci are normal for age. Minimal supratentorial white matter hypodensities less than expected for patient's age, though non-specific are most compatible with chronic small vessel ischemic disease. No acute large vascular territory infarcts. No abnormal extra-axial fluid collections. Basal cisterns are patent. VASCULAR: Moderate calcific atherosclerosis of the carotid siphons. SKULL: No skull fracture. Osteopenia. No significant scalp soft tissue swelling. SINUSES/ORBITS: Trace chronic mastoid effusions. Paranasal sinuses are well aerated.The included ocular globes and orbital contents are non-suspicious. Status post bilateral ocular lens implants. OTHER: Patient is edentulous. IMPRESSION: Negative noncontrast CT HEAD for age. Electronically Signed   By: Awilda Metro M.D.   On: 07/10/2017 22:11   Dg Chest Portable 1 View  Result Date: 07/09/2017 CLINICAL DATA:  Short of breath today, history of atrial fibrillation EXAM: PORTABLE CHEST 1 VIEW COMPARISON:  Chest x-ray of 12/22/2016 FINDINGS: The lungs are not well aerated. There is basilar volume loss with bilateral pleural effusions, cardiomegaly, and pulmonary vascular congestion, consistent with CHF. No pneumonia is seen. The bones appear somewhat  osteopenic. IMPRESSION: 1. Poor inspiration with probable mild CHF with cardiomegaly and small pleural effusions. 2. No definite pneumonia is seen. Electronically Signed   By: Dwyane Dee M.D.   On: 07/09/2017 12:07    Cardiac Studies   Echocardiogram 12/2016: Study Conclusions  - Left ventricle: The cavity size was normal. Wall thickness was   increased in a pattern of mild LVH. Indeterminant diastolic   function (atrial fibrillation). The estimated ejection fraction   was 35%. Diffuse hypokinesis. - Aortic valve: Trileaflet; moderately calcified leaflets.   Sclerosis without stenosis. There was trivial regurgitation. - Mitral valve: Mildly calcified annulus. Mildly calcified leaflets   . There was trivial regurgitation. - Left atrium: The atrium was moderately dilated. - Right ventricle: The cavity size was mildly dilated. Systolic  function was mildly reduced. - Right atrium: The atrium was moderately dilated. - Tricuspid valve: There was moderate-severe regurgitation. Peak   RV-RA gradient (S): 66 mm Hg. - Pulmonary arteries: PA peak pressure: 69 mm Hg (S). - Inferior vena cava: The vessel was normal in size. The   respirophasic diameter changes were in the normal range (>= 50%),   consistent with normal central venous pressure. - Pericardium, extracardiac: A trivial pericardial effusion was   identified.  Impressions:  - The patient was in atrial fibrillation. Normal LV size with mild   LV hypertrophy. EF 35% with diffuse hypokinesis. Mildly dilated   RV with mildly decreased systolic function. Aortic valve   sclerosis without significant stenosis. Moderate to severe   tricuspid regurgitation. Moderate to severe pulmonary   hypertension.  Repeat echo pending  Patient Profile     84 y.o. female with a hx of chronic atrial fibrillation, chronic systolic and diastolic heart failure, HTN, hypothyroidism, pulmonary HTN, and CKD stage IIIwho is being followed by  cardiology for CHF exacerbation and Afib  Assessment & Plan    1. Acute on chronic combined CHF: p/w SOB. CXR with pulmonary vascular congestion. Started on IV lasix daily. Weight 141>138lbs. UOP with net - in the past 24 hours; -1.6L this admission.  - Echo pending - Continue IV lasix daily for now given continued decreased breath sounds at lung bases and CXR overnight with continued pulmonary edema.   2. Persistent atrial fibrillation: Metoprolol increased and restarted on diltiazem gtt yesterday for RVR. Rate improved this morning.  - Continue diltiazem gtt for now - anticipate transition back to po in the next 24 hours.  - Continue coumadin per pharmacy - INR supratherapeutic today at 3.27; warfarin on hold today  3. AMS:  Noted to have acute AMS yesterday evening with generalized complaints of not feeling well. CXR with CHF. CT Head without acute findings. Labs notable for increased leukocytosis. Recent UA with leuks, UCx with 50,000 cfu Ecoli. Now with new R posterior knee TTP and warmth. No documented fevers. - Primary team notified of new R knee complaints overnight - will defer further work-up to primary team.  4. CKD stage III: Cr stable at 1.49 today - Continue to monitor closely with diuresis  5. HTN: BP remains elevated - Continue to monitor closely with titration of medications for afib management.   For questions or updates, please contact [facility] Please consult [email or web address] for contact info under Cardiology/STEMI.      Signed, Beatriz Stallion, PA-C  07/11/2017, 8:30 AM   [phone]  History and all data above reviewed.  Patient examined.  I agree with the findings as above.  She is today complaining of pain in the cephalad portion of her right calf.  This is new compared to yesterday.   The patient exam reveals YNW:[id number]  ,  Lungs: Decreased breath sounds at the base  ,  Abd: Positive bowel sounds, no rebound no guarding, Ext Right calf tender to  palpation.   .  All available labs, radiology testing, previous records reviewed. Agree with documented assessment and plan. Atrial fib:  Continue IV Dilt.  Acute on chronic systolic HF:  Plan IV diuresis today and then dose adjust as needed.  Possibly PO tomorrow.  Calf tenderness:  Message sent to primary team.  Perhaps Doppler.  This was not present yesterday and she wonders if it had anything to do with being moved to a stretcher last night when she went for  imaging after "going crazy"    Rollene RotundaJames Mary-Anne Polizzi  10:54 AM  07/11/2017

## 2017-07-11 NOTE — Progress Notes (Signed)
  Echocardiogram 2D Echocardiogram has been performed.  Tonya Farley  Tonya Farley 07/11/2017, 10:09 AM

## 2017-07-11 NOTE — Progress Notes (Signed)
PROGRESS NOTE    Tonya Farley   ZOX:[id number]  DOB: 12/07/1919  DOA: 07/09/2017 PCP: Burton Apley, MD   Brief Narrative:  Tonya Farley is a 84 year old female with a past medical history of systolic and diastolic heart failure, severe pulmonary hypertension, chronic atrial fibrillation on Coumadin, CKD 3, hyperlipidemia, hypothyroidism, hypertension who presented to the hospital with A. fib with RVR with heart rate 180-200 stating that had shortness of breath and she felt like she was going to die.  Oxygen level was 88% on room air.  EMS gave her 15 mg of IV Lopressor which brought her heart rate down to 110s 120s.  Of note she was seen in urgent care for wheezing on 4/13 and was given albuterol inhaler which she was using every 4-6 hours. Cardiology was consulted in the ED. she was admitted to the [facility] service.   Subjective: The patient states that this is the first day of she has really been coherent.  She is not sure how long her knee has been swollen but thinks it started in the hospital.  She has no complaints of shortness of breath cough or palpitations. ROS: no complaints of nausea, vomiting, constipation diarrhea, cough, dyspnea or dysuria. No other complaints.   Assessment & Plan:   Principal Problem:   Acute respiratory failure with hypoxia  -Suspected to be secondary to acute systolic and diastolic heart failure in setting of A. fib with RVR -She received 80 mg of IV Lasix this morning- wean oxygen as able  -she was on 1 L O2 with a pulse ox of 99% when I saw her earlier -2D echo shows an EF of 30-35%, severe pulmonary hypertension and moderate decrease in RV function -Cardiology managing -Avoid albuterol  Active Problems:   Acute on chronic systolic heart failure  -See above    Atrial fibrillation with RVR  -On Cardizem infusion and rate controlled-cardiology plans to transition to oral tomorrow -She has lost her IV today and IV nurses are  not able to obtain an IV even with ultrasound - She has been on 5 mg of Cardizem an hour today-we will transition to 30 mg oral every 6 hours -Continue Lopressor 50 mg every 6 hours  Swelling of left knee -Apparently this started during the hospital stay-no history of gout but being on Lasix, she is at risk for it  -she has had no fever but WBC count has gone up from 12-16 today - have consulted orthopedic surgery-Ortho has done an arthrocentesis and feels like it is gout- fluid sent for labs-they will most likely due to intra-articular steroids tomorrow if fluid is consistent with gout  Elevated INR -Pharmacy managing Coumadin    Hypothyroidism, adult -Continue Synthroid    CKD (chronic kidney disease) stage 3-4, GFR 15-29 ml/min  -follow with diuresis      DVT prophylaxis: Coumadin Code Status: DNR Family Communication: Daughter Disposition Plan: Follow on telemetry Consultants:   Cardiology  Orthopedic surgery Procedures:   2D echo Left ventricle: The cavity size was normal. Wall thickness was   normal. Systolic function was moderately reduced. The estimated   ejection fraction was in the range of 35% to 40%. Diffuse   hypokinesis. - Ventricular septum: The contour showed diastolic flattening and   systolic flattening. These changes are consistent with RV volume   and pressure overload. - Aortic valve: Right coronary cusp mobility was mildly restricted. - Mitral valve: There was mild regurgitation. - Left atrium: The atrium was  moderately to severely dilated. - Right ventricle: The cavity size was moderately dilated. Systolic   function was moderately reduced. - Right atrium: The atrium was severely dilated. - Tricuspid valve: There was moderate regurgitation directed   centrally. - Pulmonary arteries: Systolic pressure was severely increased. PA   peak pressure: 86 mm Hg (S). - Pericardium, extracardiac: A trivial pericardial effusion was   identified.  Left  knee arthrocentesis  Antimicrobials:  Anti-infectives (From admission, onward)   None       Objective: Vitals:   07/11/17 1208 07/11/17 1325 07/11/17 1445 07/11/17 1638  BP: (!) 142/73   (!) 148/91  Pulse: (!) 58  84 (!) 57  Resp: (!) 22   (!) 25  Temp: 98.7 F (37.1 C)   98.2 F (36.8 C)  TempSrc: Axillary   Oral  SpO2: 99% 98%  95%  Weight:      Height:        Intake/Output Summary (Last 24 hours) at 07/11/2017 1910 Last data filed at 07/11/2017 1900 Gross per 24 hour  Intake 722.17 ml  Output 450 ml  Net 272.17 ml   Filed Weights   07/09/17 1121 07/10/17 0315 07/11/17 0347  Weight: 63 kg (139 lb) 64.2 kg (141 lb 8.6 oz) 63 kg (138 lb 14.2 oz)    Examination: General exam: Appears comfortable  HEENT: PERRLA, oral mucosa moist, no sclera icterus or thrush Respiratory system: Clear to auscultation. Respiratory effort normal. Cardiovascular system: S1 & S2 heard, IIRR.   Gastrointestinal system: Abdomen soft, non-tender, nondistended. Normal bowel sound. No organomegaly Central nervous system: Alert and oriented. No focal neurological deficits. Extremities: No cyanosis, clubbing -has swelling and tenderness of her right knee Skin: No rashes or ulcers Psychiatry:  Mood & affect appropriate.     Data Reviewed: I have personally reviewed following labs and imaging studies  CBC: Recent Labs  Lab 07/09/17 1146 07/10/17 0337 07/10/17 2011  WBC 13.4* 12.0* 16.6*  HGB 10.0* 11.9* 12.5  HCT 31.8* 37.5 40.4  MCV 83.2 84.1 83.3  PLT 273 217 236   Basic Metabolic Panel: Recent Labs  Lab 07/09/17 1146 07/10/17 0337 07/10/17 2011 07/11/17 0312  NA 142 142 144 143  K 3.6 3.4* 4.2 4.2  CL 107 106 106 106  CO2 23 24 23 24   GLUCOSE 128* 109* 117* 132*  BUN 26* 26* 25* 25*  CREATININE 1.53* 1.42* 1.45* 1.49*  CALCIUM 9.3 9.2 9.5 9.3  MG 2.1  --   --   --    GFR: Estimated Creatinine Clearance: 19.5 mL/min (A) (by C-G formula based on SCr of 1.49 mg/dL  (H)). Liver Function Tests: Recent Labs  Lab 07/09/17 1146 07/10/17 2011  AST 24 24  ALT 20 17  ALKPHOS 73 73  BILITOT 1.1 1.5*  PROT 6.6 6.3*  ALBUMIN 3.5 3.4*   No results for input(s): LIPASE, AMYLASE in the last 168 hours. No results for input(s): AMMONIA in the last 168 hours. Coagulation Profile: Recent Labs  Lab 07/09/17 1146 07/10/17 0337 07/11/17 0312  INR 2.70 2.82 3.27   Cardiac Enzymes: No results for input(s): CKTOTAL, CKMB, CKMBINDEX, TROPONINI in the last 168 hours. BNP (last 3 results) No results for input(s): PROBNP in the last 8760 hours. HbA1C: No results for input(s): HGBA1C in the last 72 hours. CBG: No results for input(s): GLUCAP in the last 168 hours. Lipid Profile: No results for input(s): CHOL, HDL, LDLCALC, TRIG, CHOLHDL, LDLDIRECT in the last 72 hours. Thyroid Function Tests:  Recent Labs    07/09/17 1923  TSH 3.384   Anemia Panel: No results for input(s): VITAMINB12, FOLATE, FERRITIN, TIBC, IRON, RETICCTPCT in the last 72 hours. Urine analysis:    Component Value Date/Time   COLORURINE YELLOW 07/09/2017 1736   APPEARANCEUR CLEAR 07/09/2017 1736   LABSPEC 1.010 07/09/2017 1736   PHURINE 6.0 07/09/2017 1736   GLUCOSEU NEGATIVE 07/09/2017 1736   HGBUR NEGATIVE 07/09/2017 1736   BILIRUBINUR NEGATIVE 07/09/2017 1736   KETONESUR NEGATIVE 07/09/2017 1736   PROTEINUR 30 (A) 07/09/2017 1736   UROBILINOGEN 1.0 01/10/2015 2237   NITRITE NEGATIVE 07/09/2017 1736   LEUKOCYTESUR MODERATE (A) 07/09/2017 1736   Sepsis Labs: @LABRCNTIP (procalcitonin:4,lacticidven:4) ) Recent Results (from the past 240 hour(s))  Culture, Urine     Status: Abnormal   Collection Time: 07/09/17  5:36 PM  Result Value Ref Range Status   Specimen Description URINE, CATHETERIZED  Final   Special Requests   Final    NONE Performed at [facility], [address] [place], [place] [address]    Culture 50,000 COLONIES/mL ESCHERICHIA COLI (A)  Final   Report  Status 07/11/2017 FINAL  Final   Organism ID, Bacteria ESCHERICHIA COLI (A)  Final      Susceptibility   Escherichia coli - MIC*    AMPICILLIN 4 SENSITIVE Sensitive     CEFAZOLIN <=4 SENSITIVE Sensitive     CEFTRIAXONE <=1 SENSITIVE Sensitive     CIPROFLOXACIN >=4 RESISTANT Resistant     GENTAMICIN <=1 SENSITIVE Sensitive     IMIPENEM <=0.25 SENSITIVE Sensitive     NITROFURANTOIN <=16 SENSITIVE Sensitive     TRIMETH/SULFA <=20 SENSITIVE Sensitive     AMPICILLIN/SULBACTAM <=2 SENSITIVE Sensitive     PIP/TAZO <=4 SENSITIVE Sensitive     Extended ESBL NEGATIVE Sensitive     * 50,000 COLONIES/mL ESCHERICHIA COLI  Respiratory Panel by PCR     Status: None   Collection Time: 07/09/17  5:36 PM  Result Value Ref Range Status   Adenovirus NOT DETECTED NOT DETECTED Final   Coronavirus 229E NOT DETECTED NOT DETECTED Final   Coronavirus HKU1 NOT DETECTED NOT DETECTED Final   Coronavirus NL63 NOT DETECTED NOT DETECTED Final   Coronavirus OC43 NOT DETECTED NOT DETECTED Final   Metapneumovirus NOT DETECTED NOT DETECTED Final   Rhinovirus / Enterovirus NOT DETECTED NOT DETECTED Final   Influenza A NOT DETECTED NOT DETECTED Final   Influenza B NOT DETECTED NOT DETECTED Final   Parainfluenza Virus 1 NOT DETECTED NOT DETECTED Final   Parainfluenza Virus 2 NOT DETECTED NOT DETECTED Final   Parainfluenza Virus 3 NOT DETECTED NOT DETECTED Final   Parainfluenza Virus 4 NOT DETECTED NOT DETECTED Final   Respiratory Syncytial Virus NOT DETECTED NOT DETECTED Final   Bordetella pertussis NOT DETECTED NOT DETECTED Final   Chlamydophila pneumoniae NOT DETECTED NOT DETECTED Final   Mycoplasma pneumoniae NOT DETECTED NOT DETECTED Final    Comment: Performed at [facility], [address], [place], [place] [address]  MRSA PCR Screening     Status: None   Collection Time: 07/10/17  3:13 AM  Result Value Ref Range Status   MRSA by PCR NEGATIVE NEGATIVE Final    Comment:        The GeneXpert MRSA Assay  (FDA approved for NASAL specimens only), is one component of a comprehensive MRSA colonization surveillance program. It is not intended to diagnose MRSA infection nor to guide or monitor treatment for MRSA infections. Performed at [facility]  [facility], [address] [place], [place] [address]          Radiology Studies: Dg Chest 2 View  Result Date: 07/10/2017 CLINICAL DATA:  Acute on chronic systolic heart failure EXAM: CHEST - 2 VIEW COMPARISON:  07/09/2017 FINDINGS: Enlargement of cardiac silhouette with pulmonary vascular congestion. Atherosclerotic calcification aorta. Persistent interstitial infiltrates consistent with pulmonary edema. Bibasilar effusions and atelectasis. No pneumothorax. Bones demineralized. IMPRESSION: CHF with persistent small bibasilar pleural effusions and atelectasis. Electronically Signed   By: Ulyses SouthwardMark  Boles M.D.   On: 07/10/2017 09:47   Ct Head Wo Contrast  Result Date: 07/10/2017 CLINICAL DATA:  Altered level of consciousness. Hypoxia. History of hypertension, hyperlipidemia, atrial fibrillation. EXAM: CT HEAD WITHOUT CONTRAST TECHNIQUE: Contiguous axial images were obtained from the base of the skull through the vertex without intravenous contrast. COMPARISON:  CT HEAD December 22, 2016 FINDINGS: BRAIN: No intraparenchymal hemorrhage, mass effect nor midline shift. The ventricles and sulci are normal for age. Minimal supratentorial white matter hypodensities less than expected for patient's age, though non-specific are most compatible with chronic small vessel ischemic disease. No acute large vascular territory infarcts. No abnormal extra-axial fluid collections. Basal cisterns are patent. VASCULAR: Moderate calcific atherosclerosis of the carotid siphons. SKULL: No skull fracture. Osteopenia. No significant scalp soft tissue swelling. SINUSES/ORBITS: Trace chronic mastoid effusions. Paranasal sinuses are well aerated.The included ocular globes and orbital  contents are non-suspicious. Status post bilateral ocular lens implants. OTHER: Patient is edentulous. IMPRESSION: Negative noncontrast CT HEAD for age. Electronically Signed   By: Awilda Metroourtnay  Bloomer M.D.   On: 07/10/2017 22:11      Scheduled Meds: . atorvastatin  10 mg Oral Daily  . brimonidine  1 drop Both Eyes TID  . diltiazem  30 mg Oral Q6H  . furosemide  80 mg Intravenous Daily  . latanoprost  1 drop Both Eyes QHS  . levothyroxine  88 mcg Oral QAC breakfast  . lidocaine (PF)  30 mL Infiltration Once  . mouth rinse  15 mL Mouth Rinse BID  . metoprolol tartrate  50 mg Oral Q6H  . pentoxifylline  400 mg Oral TID WC  . potassium chloride SA  20 mEq Oral BID  . sodium chloride flush  3 mL Intravenous Q12H  . timolol  1 drop Both Eyes q morning - 10a  . warfarin  2.5 mg Oral q1800  . Warfarin - Pharmacist Dosing Inpatient   Does not apply q1800   Continuous Infusions: . sodium chloride 250 mL (07/11/17 0710)  . diltiazem (CARDIZEM) infusion 5 mg/hr (07/11/17 1303)     LOS: 2 days    Time spent in minutes: 45- I had discussions with patient, sister,  Cardiology, orthopedic surgery and multiple discussions with RN.   Calvert CantorSaima Roland Lipke, MD [facility] Pager: [email or web address] Password [email or web address] 07/11/2017, 7:10 PM

## 2017-07-12 LAB — BASIC METABOLIC PANEL
ANION GAP: 12 (ref 5–15)
BUN: 29 mg/dL — AB (ref 6–20)
CHLORIDE: 102 mmol/L (ref 101–111)
CO2: 28 mmol/L (ref 22–32)
Calcium: 9.6 mg/dL (ref 8.9–10.3)
Creatinine, Ser: 1.57 mg/dL — ABNORMAL HIGH (ref 0.44–1.00)
GFR calc Af Amer: 31 mL/min — ABNORMAL LOW (ref >60)
GFR, EST NON AFRICAN AMERICAN: 27 mL/min — AB (ref >60)
Glucose, Bld: 115 mg/dL — ABNORMAL HIGH (ref 65–99)
POTASSIUM: 4.3 mmol/L (ref 3.5–5.1)
SODIUM: 142 mmol/L (ref 135–145)

## 2017-07-12 LAB — PROTIME-INR
INR: 4.52
Prothrombin Time: 42.6 seconds — ABNORMAL HIGH (ref 11.4–15.2)

## 2017-07-12 MED ORDER — DILTIAZEM HCL ER COATED BEADS 120 MG PO CP24
120.0000 mg | ORAL_CAPSULE | Freq: Every day | ORAL | Status: DC
  Administered 2017-07-13 – 2017-07-14 (×2): 120 mg via ORAL
  Filled 2017-07-12 (×2): qty 1

## 2017-07-12 MED ORDER — METHYLPREDNISOLONE ACETATE 80 MG/ML IJ SUSP
80.0000 mg | Freq: Once | INTRAMUSCULAR | Status: AC
  Administered 2017-07-12: 80 mg via INTRA_ARTICULAR
  Filled 2017-07-12: qty 1

## 2017-07-12 MED ORDER — METOPROLOL TARTRATE 100 MG PO TABS
100.0000 mg | ORAL_TABLET | Freq: Two times a day (BID) | ORAL | Status: DC
  Administered 2017-07-12 – 2017-07-14 (×4): 100 mg via ORAL
  Filled 2017-07-12 (×4): qty 1

## 2017-07-12 NOTE — Progress Notes (Signed)
CRITICAL VALUE ALERT  Critical Value:  INR 4.52  Date & Time Notied:  4/20 0558  Provider Notified: Bruna PotterBlount ([facility])  Orders Received/Actions taken: No new orders, will continue to monitor

## 2017-07-12 NOTE — Progress Notes (Signed)
Progress Note  Patient Name: Tonya Farley Date of Encounter: 07/12/2017  Primary Cardiologist: Dr. Everette Rank  Subjective   States that she has not been sleeping well.  No chest pain or shortness of breath.  No palpitations.  Right knee feels somewhat better.  Inpatient Medications    Scheduled Meds: . atorvastatin  10 mg Oral Daily  . brimonidine  1 drop Both Eyes TID  . diltiazem  30 mg Oral Q6H  . furosemide  40 mg Oral BID  . latanoprost  1 drop Both Eyes QHS  . levothyroxine  88 mcg Oral QAC breakfast  . lidocaine (PF)  30 mL Infiltration Once  . mouth rinse  15 mL Mouth Rinse BID  . methylPREDNISolone acetate  80 mg Intra-articular Once  . metoprolol tartrate  50 mg Oral Q6H  . pentoxifylline  400 mg Oral TID WC  . potassium chloride SA  20 mEq Oral BID  . sodium chloride flush  3 mL Intravenous Q12H  . timolol  1 drop Both Eyes q morning - 10a  . warfarin  2.5 mg Oral q1800  . Warfarin - Pharmacist Dosing Inpatient   Does not apply q1800   Continuous Infusions: . sodium chloride 250 mL (07/11/17 0710)  . diltiazem (CARDIZEM) infusion Stopped (07/11/17 1952)   PRN Meds: sodium chloride, acetaminophen, levalbuterol, ondansetron (ZOFRAN) IV, sodium chloride flush   Vital Signs    Vitals:   07/12/17 0407 07/12/17 0410 07/12/17 0542 07/12/17 0814  BP: (!) 143/74 (!) 143/74 (!) 132/54 139/77  Pulse: 62 93 82 85  Resp: 20 18 15  (!) 22  Temp:  97.7 F (36.5 C)  97.8 F (36.6 C)  TempSrc:  Oral  Oral  SpO2: 100% 99% 100% 92%  Weight:  129 lb 14.4 oz (58.9 kg)    Height:        Intake/Output Summary (Last 24 hours) at 07/12/2017 0927 Last data filed at 07/12/2017 0557 Gross per 24 hour  Intake 484.59 ml  Output 402 ml  Net 82.59 ml   Filed Weights   07/10/17 0315 07/11/17 0347 07/12/17 0410  Weight: 141 lb 8.6 oz (64.2 kg) 138 lb 14.2 oz (63 kg) 129 lb 14.4 oz (58.9 kg)    Telemetry    Atrial fibrillation.  Personally reviewed.  Physical Exam    GEN:  Elderly woman, no acute distress.   Neck: No JVD. Cardiac:  Irregularly irregular, no gallop.  Respiratory:  Scattered rhonchi. GI: Soft, nontender, bowel sounds present. MS:  Mild right knee swelling. Neuro:  Nonfocal. Psych: Alert and oriented x 3. Normal affect.  Labs    Chemistry Recent Labs  Lab 07/09/17 1146  07/10/17 2011 07/11/17 0312 07/12/17 0401  NA 142   < > 144 143 142  K 3.6   < > 4.2 4.2 4.3  CL 107   < > 106 106 102  CO2 23   < > 23 24 28   GLUCOSE 128*   < > 117* 132* 115*  BUN 26*   < > 25* 25* 29*  CREATININE 1.53*   < > 1.45* 1.49* 1.57*  CALCIUM 9.3   < > 9.5 9.3 9.6  PROT 6.6  --  6.3*  --   --   ALBUMIN 3.5  --  3.4*  --   --   AST 24  --  24  --   --   ALT 20  --  17  --   --  ALKPHOS 73  --  73  --   --   BILITOT 1.1  --  1.5*  --   --   GFRNONAA 28*   < > 30* 29* 27*  GFRAA 32*   < > 34* 33* 31*  ANIONGAP 12   < > 15 13 12    < > = values in this interval not displayed.     Hematology Recent Labs  Lab 07/09/17 1146 07/10/17 0337 07/10/17 2011  WBC 13.4* 12.0* 16.6*  RBC 3.82* 4.46 4.85  HGB 10.0* 11.9* 12.5  HCT 31.8* 37.5 40.4  MCV 83.2 84.1 83.3  MCH 26.2 26.7 25.8*  MCHC 31.4 31.7 30.9  RDW 18.4* 18.3* 17.9*  PLT 273 217 236    Cardiac EnzymesNo results for input(s): TROPONINI in the last 168 hours.  Recent Labs  Lab 07/09/17 1201  TROPIPOC 0.02     BNP Recent Labs  Lab 07/09/17 1146  BNP 897.3*     Radiology    Ct Head Wo Contrast  Result Date: 07/10/2017 CLINICAL DATA:  Altered level of consciousness. Hypoxia. History of hypertension, hyperlipidemia, atrial fibrillation. EXAM: CT HEAD WITHOUT CONTRAST TECHNIQUE: Contiguous axial images were obtained from the base of the skull through the vertex without intravenous contrast. COMPARISON:  CT HEAD December 22, 2016 FINDINGS: BRAIN: No intraparenchymal hemorrhage, mass effect nor midline shift. The ventricles and sulci are normal for age. Minimal  supratentorial white matter hypodensities less than expected for patient's age, though non-specific are most compatible with chronic small vessel ischemic disease. No acute large vascular territory infarcts. No abnormal extra-axial fluid collections. Basal cisterns are patent. VASCULAR: Moderate calcific atherosclerosis of the carotid siphons. SKULL: No skull fracture. Osteopenia. No significant scalp soft tissue swelling. SINUSES/ORBITS: Trace chronic mastoid effusions. Paranasal sinuses are well aerated.The included ocular globes and orbital contents are non-suspicious. Status post bilateral ocular lens implants. OTHER: Patient is edentulous. IMPRESSION: Negative noncontrast CT HEAD for age. Electronically Signed   By: Awilda Metroourtnay  Bloomer M.D.   On: 07/10/2017 22:11   Dg Knee Complete 4 Views Right  Result Date: 07/11/2017 CLINICAL DATA:  Right knee pain and swelling Status post right knee aspiration EXAM: RIGHT KNEE - COMPLETE 4+ VIEW COMPARISON:  None. FINDINGS: Vascular calcifications. Mild 3 compartment osteoarthritis, with subchondral sclerosis and osteophyte formation. Chondrocalcinosis within the lateral and medial menisci. Small suprapatellar joint effusion. IMPRESSION: Degenerative change, without acute osseous finding. Chondrocalcinosis in the menisci, consistent with calcium pyrophosphate deposition disease. Small suprapatellar joint effusion. Electronically Signed   By: Jeronimo GreavesKyle  Talbot M.D.   On: 07/11/2017 20:01    Cardiac Studies   Echocardiogram 07/11/2017: Study Conclusions  - Left ventricle: The cavity size was normal. Wall thickness was   normal. Systolic function was moderately reduced. The estimated   ejection fraction was in the range of 35% to 40%. Diffuse   hypokinesis. - Ventricular septum: The contour showed diastolic flattening and   systolic flattening. These changes are consistent with RV volume   and pressure overload. - Aortic valve: Right coronary cusp mobility was  mildly restricted. - Mitral valve: There was mild regurgitation. - Left atrium: The atrium was moderately to severely dilated. - Right ventricle: The cavity size was moderately dilated. Systolic   function was moderately reduced. - Right atrium: The atrium was severely dilated. - Tricuspid valve: There was moderate regurgitation directed   centrally. - Pulmonary arteries: Systolic pressure was severely increased. PA   peak pressure: 86 mm Hg (S). - Pericardium, extracardiac: A  trivial pericardial effusion was   identified.  Patient Profile     84 y.o. female with history of chronic atrial fibrillation, hypertension, hypothyroidism, CKD stage 3, and pulmonary hypertension, currently being managed for acute on chronic combined heart failure.  Assessment & Plan    1.  Chronic atrial fibrillation.  She is on Coumadin for stroke prophylaxis with followed by pharmacy.  Now on oral heart rate control regimen including short acting Cardizem and Lopressor being dosed every 6 hours.  2.  Acute on chronic combined heart failure.  Now on oral Lasix with fairly even intake and output last 24 hours.  3.  Essential hypertension, systolic blood pressure 130s-140s.   4.  CKD stage 3, creatinine 1.57.  5.  Severe pulmonary hypertension, PASP 86 mmHg by recent echocardiogram.  Continue Coumadin per pharmacy.  No change in oral Lasix or potassium supplements at this time.  Would like to try and transition her heart rate control regimen to fewer times a day to facilitate ease of use as an outpatient.  Change Lopressor to 100 mg twice daily and convert to Cardizem CD to 120 mg daily starting tomorrow.  Signed, Nona Dell, MD  07/12/2017, 9:27 AM

## 2017-07-12 NOTE — Progress Notes (Signed)
PT Cancellation Note  Patient Details Name: Tonya Farley MRN: [id number] DOB: 12/21/1919   Cancelled Treatment:    Reason Eval/Treat Not Completed: Patient not medically ready   Fabio AsaDevon J Jeny Nield 07/12/2017, 9:27 AM

## 2017-07-12 NOTE — Progress Notes (Signed)
Subjective: Ms. Araceli BoucheHoneycutt is doing well this morning. She rates her knee pain as 3/10 with more pain with direct pressure. She feels that the knee pain is continuing to improve with rest and elevation. She denies fever, chills, nausea, vomiting, or diarrhea.   Objective: Vital signs in last 24 hours: Temp:  [97.4 F (36.3 C)-98.7 F (37.1 C)] 97.8 F (36.6 C) (04/20 0814) Pulse Rate:  [57-98] 85 (04/20 0814) Resp:  [15-25] 22 (04/20 0814) BP: (132-148)/(54-91) 139/77 (04/20 0814) SpO2:  [92 %-100 %] 92 % (04/20 0814) Weight:  [58.9 kg (129 lb 14.4 oz)] 58.9 kg (129 lb 14.4 oz) (04/20 0410)  Intake/Output from previous day: 04/19 0701 - 04/20 0700 In: 704.6 [P.O.:600; I.V.:104.6] Out: 652 [Urine:651; Stool:1] Intake/Output this shift: No intake/output data recorded.  Recent Labs    07/09/17 1146 07/10/17 0337 07/10/17 2011  HGB 10.0* 11.9* 12.5   Recent Labs    07/10/17 0337 07/10/17 2011  WBC 12.0* 16.6*  RBC 4.46 4.85  HCT 37.5 40.4  PLT 217 236   Recent Labs    07/11/17 0312 07/12/17 0401  NA 143 142  K 4.2 4.3  CL 106 102  CO2 24 28  BUN 25* 29*  CREATININE 1.49* 1.57*  GLUCOSE 132* 115*  CALCIUM 9.3 9.6   Recent Labs    07/11/17 0312 07/12/17 0401  INR 3.27 4.52*   Alert and oriented x 4 Neurovascular intact Sensation intact distally Intact pulses distally Dorsiflexion/Plantar flexion intact Compartment soft  No erythema, ecchymosis, or purulent drainage.  Moderate swelling of the right knee.  After verbal consent was obtained the knee was cleaned and prepped in a sterile fashion. An intra-articular cortisone injection comprised of 3cc's of Lidocaine and 2 cc's of Depo-Medrol was injected to the right knee. The patient tolerated the injection very well.    Assessment/Plan: Discussed the diagnosis of gout with the patient and reviewed the treatment options. Discussed the reason and rationale for an intra-articular cortisone injection. The  patient agreed and the injection was completed.  Continue with WBAT of the RLE. RICE protocol for right knee.  Continue with pain management as needed.      Karma GreaserSamantha Bonham Barton 07/12/2017, 9:59 AM

## 2017-07-12 NOTE — Progress Notes (Signed)
PROGRESS NOTE    Tonya Farley   NWG:[id number]  DOB: 1919-05-06  DOA: 07/09/2017 PCP: Burton Apley, MD   Brief Narrative:  Tonya Farley is a 84 year old female with a past medical history of systolic and diastolic heart failure, severe pulmonary hypertension, chronic atrial fibrillation on Coumadin, CKD 3, hyperlipidemia, hypothyroidism, hypertension who presented to the hospital with A. fib with RVR with heart rate 180-200 stating that had shortness of breath and she felt like she was going to die.  Oxygen level was 88% on room air.  EMS gave her 15 mg of IV Lopressor which brought her heart rate down to 110s 120s.  Of note she was seen in urgent care for wheezing on 4/13 and was given albuterol inhaler which she was using every 4-6 hours. Cardiology was consulted in the ED. [facility].   Subjective: The patient states that this is the first day of she has really been coherent.  She is not sure how long her knee has been swollen but thinks it started in the hospital.  She has no complaints of shortness of breath cough or palpitations. ROS: no complaints of nausea, vomiting, constipation diarrhea, cough, dyspnea or dysuria. No other complaints.   Assessment & Plan:    Acute respiratory failure with hypoxia  - multifactorial secondary to acute systolic/diastolic CHF in the setting of uncontrolled A. Fib, with baseline pulmonary hypertension. - resolved, currently on room air -2D echo shows an EF of 30-35%, severe pulmonary hypertension and moderate decrease in RV function -Cardiology managing     Acute on chronic systolic heart failure  -cardiology input is appreciated, now on  oral Lasix    Atrial fibrillation with RVR  - initially req including Cardizem CD 120 mg daily, and Lopressor 100 mg oral twice daily. - On warfarin for anticoagulation, pharmacy to dose   Right knee gout -status post arthrocentesis, received  intra-articular steroid injection by orthopedic today  Elevated INR -Pharmacy managing Coumadin    Hypothyroidism, adult -Continue Synthroid    CKD (chronic kidney disease) stage 3-4, GFR 15-29 ml/min  -follow with diuresis      DVT prophylaxis: Coumadin Code Status: DNR Family Communication: none at bedside Disposition Plan: Follow on telemetry Consultants:   Cardiology  Orthopedic surgery Procedures:   2D echo Left ventricle: The cavity size was normal. Wall thickness was   normal. Systolic function was moderately reduced. The estimated   ejection fraction was in the range of 35% to 40%. Diffuse   hypokinesis. - Ventricular septum: The contour showed diastolic flattening and   systolic flattening. These changes are consistent with RV volume   and pressure overload. - Aortic valve: Right coronary cusp mobility was mildly restricted. - Mitral valve: There was mild regurgitation. - Left atrium: The atrium was moderately to severely dilated. - Right ventricle: The cavity size was moderately dilated. Systolic   function was moderately reduced. - Right atrium: The atrium was severely dilated. - Tricuspid valve: There was moderate regurgitation directed   centrally. - Pulmonary arteries: Systolic pressure was severely increased. PA   peak pressure: 86 mm Hg (S). - Pericardium, extracardiac: A trivial pericardial effusion was   identified.  Left knee arthrocentesis  Antimicrobials:  Anti-infectives (From admission, onward)   None       Objective: Vitals:   07/12/17 0407 07/12/17 0410 07/12/17 0542 07/12/17 0814  BP: (!) 143/74 (!) 143/74 (!) 132/54 139/77  Pulse: 62 93 82 85  Resp: 20 18 15  (!) 22  Temp:  97.7 F (36.5 C)  97.8 F (36.6 C)  TempSrc:  Oral  Oral  SpO2: 100% 99% 100% 92%  Weight:  58.9 kg (129 lb 14.4 oz)    Height:        Intake/Output Summary (Last 24 hours) at 07/12/2017 1331 Last data filed at 07/12/2017 1100 Gross per 24 hour    Intake 503 ml  Output 202 ml  Net 301 ml   Filed Weights   07/10/17 0315 07/11/17 0347 07/12/17 0410  Weight: 64.2 kg (141 lb 8.6 oz) 63 kg (138 lb 14.2 oz) 58.9 kg (129 lb 14.4 oz)    Examination: General exam: Appears comfortable  Respiratory system: clear to auscultation, good air entry, no wheezing Cardiovascular system: S1 & S2 heard, irregular irregular Gastrointestinal system: Abdomen soft, non-tender, nondistended. Normal bowel sound. No organomegaly Central nervous system: Alert and oriented. No focal neurological deficits. Extremities: No cyanosis, clubbing -has some swelling and tenderness in the right knee, but no erythema Skin: No rashes or ulcers Psychiatry:  Mood & affect appropriate.     Data Reviewed: I have personally reviewed following labs and imaging studies  CBC: Recent Labs  Lab 07/09/17 1146 07/10/17 0337 07/10/17 2011  WBC 13.4* 12.0* 16.6*  HGB 10.0* 11.9* 12.5  HCT 31.8* 37.5 40.4  MCV 83.2 84.1 83.3  PLT 273 217 236   Basic Metabolic Panel: Recent Labs  Lab 07/09/17 1146 07/10/17 0337 07/10/17 2011 07/11/17 0312 07/12/17 0401  NA 142 142 144 143 142  K 3.6 3.4* 4.2 4.2 4.3  CL 107 106 106 106 102  CO2 23 24 23 24 28   GLUCOSE 128* 109* 117* 132* 115*  BUN 26* 26* 25* 25* 29*  CREATININE 1.53* 1.42* 1.45* 1.49* 1.57*  CALCIUM 9.3 9.2 9.5 9.3 9.6  MG 2.1  --   --   --   --    GFR: Estimated Creatinine Clearance: 18.5 mL/min (A) (by C-G formula based on SCr of 1.57 mg/dL (H)). Liver Function Tests: Recent Labs  Lab 07/09/17 1146 07/10/17 2011  AST 24 24  ALT 20 17  ALKPHOS 73 73  BILITOT 1.1 1.5*  PROT 6.6 6.3*  ALBUMIN 3.5 3.4*   No results for input(s): LIPASE, AMYLASE in the last 168 hours. No results for input(s): AMMONIA in the last 168 hours. Coagulation Profile: Recent Labs  Lab 07/09/17 1146 07/10/17 0337 07/11/17 0312 07/12/17 0401  INR 2.70 2.82 3.27 4.52*   Cardiac Enzymes: No results for input(s):  CKTOTAL, CKMB, CKMBINDEX, TROPONINI in the last 168 hours. BNP (last 3 results) No results for input(s): PROBNP in the last 8760 hours. HbA1C: No results for input(s): HGBA1C in the last 72 hours. CBG: No results for input(s): GLUCAP in the last 168 hours. Lipid Profile: No results for input(s): CHOL, HDL, LDLCALC, TRIG, CHOLHDL, LDLDIRECT in the last 72 hours. Thyroid Function Tests: Recent Labs    07/09/17 1923  TSH 3.384   Anemia Panel: No results for input(s): VITAMINB12, FOLATE, FERRITIN, TIBC, IRON, RETICCTPCT in the last 72 hours. Urine analysis:    Component Value Date/Time   COLORURINE YELLOW 07/09/2017 1736   APPEARANCEUR CLEAR 07/09/2017 1736   LABSPEC 1.010 07/09/2017 1736   PHURINE 6.0 07/09/2017 1736   GLUCOSEU NEGATIVE 07/09/2017 1736   HGBUR NEGATIVE 07/09/2017 1736   BILIRUBINUR NEGATIVE 07/09/2017 1736   KETONESUR NEGATIVE 07/09/2017 1736   PROTEINUR 30 (A) 07/09/2017 1736   UROBILINOGEN 1.0 01/10/2015 2237  NITRITE NEGATIVE 07/09/2017 1736   LEUKOCYTESUR MODERATE (A) 07/09/2017 1736   Sepsis Labs: @LABRCNTIP (procalcitonin:4,lacticidven:4) ) Recent Results (from the past 240 hour(s))  Culture, Urine     Status: Abnormal   Collection Time: 07/09/17  5:36 PM  Result Value Ref Range Status   Specimen Description URINE, CATHETERIZED  Final   Special Requests   Final    NONE Performed at [facility], [address], [place], [place] [address]    Culture 50,000 COLONIES/mL ESCHERICHIA COLI (A)  Final   Report Status 07/11/2017 FINAL  Final   Organism ID, Bacteria ESCHERICHIA COLI (A)  Final      Susceptibility   Escherichia coli - MIC*    AMPICILLIN 4 SENSITIVE Sensitive     CEFAZOLIN <=4 SENSITIVE Sensitive     CEFTRIAXONE <=1 SENSITIVE Sensitive     CIPROFLOXACIN >=4 RESISTANT Resistant     GENTAMICIN <=1 SENSITIVE Sensitive     IMIPENEM <=0.25 SENSITIVE Sensitive     NITROFURANTOIN <=16 SENSITIVE Sensitive     TRIMETH/SULFA <=20  SENSITIVE Sensitive     AMPICILLIN/SULBACTAM <=2 SENSITIVE Sensitive     PIP/TAZO <=4 SENSITIVE Sensitive     Extended ESBL NEGATIVE Sensitive     * 50,000 COLONIES/mL ESCHERICHIA COLI  Respiratory Panel by PCR     Status: None   Collection Time: 07/09/17  5:36 PM  Result Value Ref Range Status   Adenovirus NOT DETECTED NOT DETECTED Final   Coronavirus 229E NOT DETECTED NOT DETECTED Final   Coronavirus HKU1 NOT DETECTED NOT DETECTED Final   Coronavirus NL63 NOT DETECTED NOT DETECTED Final   Coronavirus OC43 NOT DETECTED NOT DETECTED Final   Metapneumovirus NOT DETECTED NOT DETECTED Final   Rhinovirus / Enterovirus NOT DETECTED NOT DETECTED Final   Influenza A NOT DETECTED NOT DETECTED Final   Influenza B NOT DETECTED NOT DETECTED Final   Parainfluenza Virus 1 NOT DETECTED NOT DETECTED Final   Parainfluenza Virus 2 NOT DETECTED NOT DETECTED Final   Parainfluenza Virus 3 NOT DETECTED NOT DETECTED Final   Parainfluenza Virus 4 NOT DETECTED NOT DETECTED Final   Respiratory Syncytial Virus NOT DETECTED NOT DETECTED Final   Bordetella pertussis NOT DETECTED NOT DETECTED Final   Chlamydophila pneumoniae NOT DETECTED NOT DETECTED Final   Mycoplasma pneumoniae NOT DETECTED NOT DETECTED Final    Comment: Performed at [facility], [address], [place], [place] [address]  MRSA PCR Screening     Status: None   Collection Time: 07/10/17  3:13 AM  Result Value Ref Range Status   MRSA by PCR NEGATIVE NEGATIVE Final    Comment:        The GeneXpert MRSA Assay (FDA approved for NASAL specimens only), is one component of a comprehensive MRSA colonization surveillance program. It is not intended to diagnose MRSA infection nor to guide or monitor treatment for MRSA infections. Performed at [facility], [address] [place], [place] [address]   Body fluid culture     Status: None (Preliminary result)   Collection Time: 07/11/17  7:03 PM  Result Value Ref Range Status    Specimen Description SYNOVIAL FLUID  Final   Special Requests NONE  Final   Gram Stain   Final    ABUNDANT WBC PRESENT,BOTH PMN AND MONONUCLEAR NO ORGANISMS SEEN    Culture   Final    NO GROWTH < 12 HOURS Performed at [facility], [address], [place], [place] [address]  Report Status PENDING  Incomplete         Radiology Studies: Ct Head Wo Contrast  Result Date: 07/10/2017 CLINICAL DATA:  Altered level of consciousness. Hypoxia. History of hypertension, hyperlipidemia, atrial fibrillation. EXAM: CT HEAD WITHOUT CONTRAST TECHNIQUE: Contiguous axial images were obtained from the base of the skull through the vertex without intravenous contrast. COMPARISON:  CT HEAD December 22, 2016 FINDINGS: BRAIN: No intraparenchymal hemorrhage, mass effect nor midline shift. The ventricles and sulci are normal for age. Minimal supratentorial white matter hypodensities less than expected for patient's age, though non-specific are most compatible with chronic small vessel ischemic disease. No acute large vascular territory infarcts. No abnormal extra-axial fluid collections. Basal cisterns are patent. VASCULAR: Moderate calcific atherosclerosis of the carotid siphons. SKULL: No skull fracture. Osteopenia. No significant scalp soft tissue swelling. SINUSES/ORBITS: Trace chronic mastoid effusions. Paranasal sinuses are well aerated.The included ocular globes and orbital contents are non-suspicious. Status post bilateral ocular lens implants. OTHER: Patient is edentulous. IMPRESSION: Negative noncontrast CT HEAD for age. Electronically Signed   By: Awilda Metroourtnay  Bloomer M.D.   On: 07/10/2017 22:11   Dg Knee Complete 4 Views Right  Result Date: 07/11/2017 CLINICAL DATA:  Right knee pain and swelling Status post right knee aspiration EXAM: RIGHT KNEE - COMPLETE 4+ VIEW COMPARISON:  None. FINDINGS: Vascular calcifications. Mild 3 compartment osteoarthritis, with subchondral sclerosis and osteophyte  formation. Chondrocalcinosis within the lateral and medial menisci. Small suprapatellar joint effusion. IMPRESSION: Degenerative change, without acute osseous finding. Chondrocalcinosis in the menisci, consistent with calcium pyrophosphate deposition disease. Small suprapatellar joint effusion. Electronically Signed   By: Jeronimo GreavesKyle  Talbot M.D.   On: 07/11/2017 20:01      Scheduled Meds: . atorvastatin  10 mg Oral Daily  . brimonidine  1 drop Both Eyes TID  . [START ON 07/13/2017] diltiazem  120 mg Oral Daily  . diltiazem  30 mg Oral Q6H  . furosemide  40 mg Oral BID  . latanoprost  1 drop Both Eyes QHS  . levothyroxine  88 mcg Oral QAC breakfast  . lidocaine (PF)  30 mL Infiltration Once  . mouth rinse  15 mL Mouth Rinse BID  . metoprolol tartrate  100 mg Oral BID  . pentoxifylline  400 mg Oral TID WC  . potassium chloride SA  20 mEq Oral BID  . sodium chloride flush  3 mL Intravenous Q12H  . timolol  1 drop Both Eyes q morning - 10a  . warfarin  2.5 mg Oral q1800  . Warfarin - Pharmacist Dosing Inpatient   Does not apply q1800   Continuous Infusions: . sodium chloride 250 mL (07/11/17 0710)     LOS: 3 days    Time spent in minutes: 25 min   Huey Bienenstockawood Algenis Ballin, MD [facility] Pager:[phone] [email or web address] Password [email or web address] 07/12/2017, 1:31 PM

## 2017-07-12 NOTE — Progress Notes (Signed)
ANTICOAGULATION CONSULT NOTE - Pharmacy Consult for warfarin Indication: atrial fibrillation  Allergies  Allergen Reactions  . Penicillins Hives and Swelling    Has patient had a PCN reaction causing immediate rash, facial/tongue/throat swelling, SOB or lightheadedness with hypotension: Yes Has patient had a PCN reaction causing severe rash involving mucus membranes or skin necrosis: Yes Has patient had a PCN reaction that required hospitalization No Has patient had a PCN reaction occurring within the last 10 years: No If all of the above answers are "NO", then may proceed with Cephalosporin use.    . Sulfa Antibiotics Anaphylaxis and Swelling  . Fish Allergy Nausea And Vomiting    Patient Measurements: Height: 5' 4"  (162.6 cm) Weight: 129 lb 14.4 oz (58.9 kg) IBW/kg (Calculated) : 54.7  Vital Signs: Temp: 97.8 F (36.6 C) (04/20 0814) Temp Source: Oral (04/20 0814) BP: 139/77 (04/20 0814) Pulse Rate: 85 (04/20 0814)  Labs: Recent Labs    07/10/17 0337 07/10/17 2011 07/11/17 0312 07/12/17 0401  HGB 11.9* 12.5  --   --   HCT 37.5 40.4  --   --   PLT 217 236  --   --   LABPROT 29.4*  --  33.1* 42.6*  INR 2.82  --  3.27 4.52*  CREATININE 1.42* 1.45* 1.49* 1.57*    Estimated Creatinine Clearance: 18.5 mL/min (A) (by C-G formula based on SCr of 1.57 mg/dL (H)).   Medical History: Past Medical History:  Diagnosis Date  . Chronic a-fib ([place])   . Chronic back pain   . Chronic diastolic (congestive) heart failure (HCC)    a. Echo 3/17: mild LVH, EF 55%, no RWMA, mod AS (mean 10 mmHg, peak 21 mmHg), MAC, mild MR, severe LAE, mod reduced RVSF, mild RAE, mod TR, PASP 65 mmHg  . CKD (chronic kidney disease) stage 4, GFR 15-29 ml/min (HCC) 09/11/2015  . Hyperlipidemia   . Hypertension   . Hypothyroidism   . Pulmonary hypertension (HCC)    PASP 65 mmHg at Echo 3/17  . Thyroid disease     Medications:  Scheduled:  . atorvastatin  10 mg Oral Daily  . brimonidine  1  drop Both Eyes TID  . [START ON 07/13/2017] diltiazem  120 mg Oral Daily  . diltiazem  30 mg Oral Q6H  . furosemide  40 mg Oral BID  . latanoprost  1 drop Both Eyes QHS  . levothyroxine  88 mcg Oral QAC breakfast  . lidocaine (PF)  30 mL Infiltration Once  . mouth rinse  15 mL Mouth Rinse BID  . metoprolol tartrate  100 mg Oral BID  . pentoxifylline  400 mg Oral TID WC  . potassium chloride SA  20 mEq Oral BID  . sodium chloride flush  3 mL Intravenous Q12H  . timolol  1 drop Both Eyes q morning - 10a  . warfarin  2.5 mg Oral q1800  . Warfarin - Pharmacist Dosing Inpatient   Does not apply q1800   Assessment: 84 yo female presented to ED on 4/17 with shortness of breath, probably on the morning of admission and generalized weakness over the previous few days.  She has a history of chronic atrial fibrillation on warfarin. Pharmacy consulted 4/17 to dose warfarin. Home dose is 2.5 mg daily. INR on admission was therapeutic at 2.7. Last dose pta was 4/16.   INR today supratherapeutic at 4.52, uptrend from yesterday where dose was held for INR 3.27 s/p 3 doses of 2.29m (home dose).  Pt  only eating 25% of meals recently, may influence.    Goal of Therapy:  INR 2-3 Monitor platelets by anticoagulation protocol: Yes   Plan:  Hold warfarin dose today Monitor daily INR, CBC, s/s bleeding and INR trend  Bertis Ruddy, PharmD Pharmacy Resident Pager #: [phone] 07/12/2017 12:13 PM

## 2017-07-12 NOTE — Progress Notes (Signed)
Labs reviewed. Inflammatory markers mildly elevated.   Synovial fluid analysis: 40K WBCs, 95% N, (+) MSU crystals. Gram stain (-).  Patient has acute gout attack R knee. Plan for intraarticular cortisone injection later this am. Monitor culture.

## 2017-07-13 DIAGNOSIS — I482 Chronic atrial fibrillation: Secondary | ICD-10-CM

## 2017-07-13 LAB — BASIC METABOLIC PANEL
Anion gap: 11 (ref 5–15)
BUN: 41 mg/dL — AB (ref 6–20)
CO2: 26 mmol/L (ref 22–32)
CREATININE: 1.78 mg/dL — AB (ref 0.44–1.00)
Calcium: 9.4 mg/dL (ref 8.9–10.3)
Chloride: 102 mmol/L (ref 101–111)
GFR calc Af Amer: 27 mL/min — ABNORMAL LOW (ref >60)
GFR, EST NON AFRICAN AMERICAN: 23 mL/min — AB (ref >60)
Glucose, Bld: 232 mg/dL — ABNORMAL HIGH (ref 65–99)
POTASSIUM: 5 mmol/L (ref 3.5–5.1)
SODIUM: 139 mmol/L (ref 135–145)

## 2017-07-13 LAB — PROTIME-INR
INR: 3.9
Prothrombin Time: 37.9 seconds — ABNORMAL HIGH (ref 11.4–15.2)

## 2017-07-13 MED ORDER — SODIUM POLYSTYRENE SULFONATE 15 GM/60ML PO SUSP
15.0000 g | Freq: Once | ORAL | Status: AC
  Administered 2017-07-13: 15 g via ORAL
  Filled 2017-07-13: qty 60

## 2017-07-13 NOTE — Progress Notes (Signed)
Subjective: Pt reports that her right knee pain is a whole lot better.   Objective: Vital signs in last 24 hours: Temp:  [97.3 F (36.3 C)-98.4 F (36.9 C)] 97.3 F (36.3 C) (04/21 0820) Pulse Rate:  [80-127] 86 (04/21 0820) Resp:  [20-26] 21 (04/21 0820) BP: (119-156)/(69-92) 146/90 (04/21 0820) SpO2:  [88 %-99 %] 99 % (04/21 0820) Weight:  [60.6 kg (133 lb 9.6 oz)] 60.6 kg (133 lb 9.6 oz) (04/21 0441)  Intake/Output from previous day: 04/20 0701 - 04/21 0700 In: 659 [P.O.:656; I.V.:3] Out: 650 [Urine:650] Intake/Output this shift: No intake/output data recorded.  Recent Labs    07/10/17 2011  HGB 12.5   Recent Labs    07/10/17 2011  WBC 16.6*  RBC 4.85  HCT 40.4  PLT 236   Recent Labs    07/11/17 0312 07/12/17 0401  NA 143 142  K 4.2 4.3  CL 106 102  CO2 24 28  BUN 25* 29*  CREATININE 1.49* 1.57*  GLUCOSE 132* 115*  CALCIUM 9.3 9.6   Recent Labs    07/11/17 0312 07/12/17 0401  INR 3.27 4.52*    PE:  right knee with mild effusion.  5/5 strength at quad and hamstring.  NTTP at the knee.      Assessment/Plan: Right knee acute gout flare - the steroid injection appears to have helped significantly.  She can bear weight as tolerated on the R LE.   Tonya Farley 07/13/2017, 9:20 AM

## 2017-07-13 NOTE — Progress Notes (Signed)
Progress Note  Patient Name: Tonya Farley Date of Encounter: 07/13/2017  Primary Cardiologist: Dr. Everette Rank  Subjective   Reports mild dizziness.  No chest pain or shortness of breath.  No palpitations.  Inpatient Medications    Scheduled Meds: . atorvastatin  10 mg Oral Daily  . brimonidine  1 drop Both Eyes TID  . diltiazem  120 mg Oral Daily  . furosemide  40 mg Oral BID  . latanoprost  1 drop Both Eyes QHS  . levothyroxine  88 mcg Oral QAC breakfast  . lidocaine (PF)  30 mL Infiltration Once  . mouth rinse  15 mL Mouth Rinse BID  . metoprolol tartrate  100 mg Oral BID  . pentoxifylline  400 mg Oral TID WC  . potassium chloride SA  20 mEq Oral BID  . sodium chloride flush  3 mL Intravenous Q12H  . timolol  1 drop Both Eyes q morning - 10a  . warfarin  2.5 mg Oral q1800  . Warfarin - Pharmacist Dosing Inpatient   Does not apply q1800   Continuous Infusions: . sodium chloride 250 mL (07/11/17 0710)   PRN Meds: sodium chloride, acetaminophen, levalbuterol, ondansetron (ZOFRAN) IV, sodium chloride flush   Vital Signs    Vitals:   07/12/17 1944 07/13/17 0110 07/13/17 0441 07/13/17 0820  BP: 126/69 (!) 147/75 (!) 156/86 (!) 146/90  Pulse: 100 80 (!) 105 86  Resp: (!) 25 20 (!) 26 (!) 21  Temp: (!) 97.5 F (36.4 C) (!) 97.3 F (36.3 C) (!) 97.4 F (36.3 C) (!) 97.3 F (36.3 C)  TempSrc: Oral Oral Oral Oral  SpO2: (!) 88% 96% 95% 99%  Weight:   133 lb 9.6 oz (60.6 kg)   Height:        Intake/Output Summary (Last 24 hours) at 07/13/2017 1013 Last data filed at 07/13/2017 0300 Gross per 24 hour  Intake 659 ml  Output 650 ml  Net 9 ml   Filed Weights   07/11/17 0347 07/12/17 0410 07/13/17 0441  Weight: 138 lb 14.2 oz (63 kg) 129 lb 14.4 oz (58.9 kg) 133 lb 9.6 oz (60.6 kg)    Telemetry    Atrial fibrillation, heart rate in the 90s.  Personally reviewed.  Physical Exam   GEN:  Elderly woman, no acute distress.   Neck: No JVD. Cardiac:   Irregularly irregular, no gallop.  Respiratory: Nonlabored.  Few scattered rhonchi. GI: Soft, nontender, bowel sounds present. MS:  Improved right knee swelling. Neuro:  Nonfocal. Psych: Alert and oriented x 3. Normal affect.  Labs    Chemistry Recent Labs  Lab 07/09/17 1146  07/10/17 2011 07/11/17 0312 07/12/17 0401  NA 142   < > 144 143 142  K 3.6   < > 4.2 4.2 4.3  CL 107   < > 106 106 102  CO2 23   < > 23 24 28   GLUCOSE 128*   < > 117* 132* 115*  BUN 26*   < > 25* 25* 29*  CREATININE 1.53*   < > 1.45* 1.49* 1.57*  CALCIUM 9.3   < > 9.5 9.3 9.6  PROT 6.6  --  6.3*  --   --   ALBUMIN 3.5  --  3.4*  --   --   AST 24  --  24  --   --   ALT 20  --  17  --   --   ALKPHOS 73  --  73  --   --  BILITOT 1.1  --  1.5*  --   --   GFRNONAA 28*   < > 30* 29* 27*  GFRAA 32*   < > 34* 33* 31*  ANIONGAP 12   < > 15 13 12    < > = values in this interval not displayed.     Hematology Recent Labs  Lab 07/09/17 1146 07/10/17 0337 07/10/17 2011  WBC 13.4* 12.0* 16.6*  RBC 3.82* 4.46 4.85  HGB 10.0* 11.9* 12.5  HCT 31.8* 37.5 40.4  MCV 83.2 84.1 83.3  MCH 26.2 26.7 25.8*  MCHC 31.4 31.7 30.9  RDW 18.4* 18.3* 17.9*  PLT 273 217 236    Cardiac EnzymesNo results for input(s): TROPONINI in the last 168 hours.  Recent Labs  Lab 07/09/17 1201  TROPIPOC 0.02     BNP Recent Labs  Lab 07/09/17 1146  BNP 897.3*     Radiology    Dg Knee Complete 4 Views Right  Result Date: 07/11/2017 CLINICAL DATA:  Right knee pain and swelling Status post right knee aspiration EXAM: RIGHT KNEE - COMPLETE 4+ VIEW COMPARISON:  None. FINDINGS: Vascular calcifications. Mild 3 compartment osteoarthritis, with subchondral sclerosis and osteophyte formation. Chondrocalcinosis within the lateral and medial menisci. Small suprapatellar joint effusion. IMPRESSION: Degenerative change, without acute osseous finding. Chondrocalcinosis in the menisci, consistent with calcium pyrophosphate deposition  disease. Small suprapatellar joint effusion. Electronically Signed   By: Jeronimo GreavesKyle  Talbot M.D.   On: 07/11/2017 20:01    Cardiac Studies   Echocardiogram 07/11/2017: Study Conclusions  - Left ventricle: The cavity size was normal. Wall thickness was normal. Systolic function was moderately reduced. The estimated ejection fraction was in the range of 35% to 40%. Diffuse hypokinesis. - Ventricular septum: The contour showed diastolic flattening and systolic flattening. These changes are consistent with RV volume and pressure overload. - Aortic valve: Right coronary cusp mobility was mildly restricted. - Mitral valve: There was mild regurgitation. - Left atrium: The atrium was moderately to severely dilated. - Right ventricle: The cavity size was moderately dilated. Systolic function was moderately reduced. - Right atrium: The atrium was severely dilated. - Tricuspid valve: There was moderate regurgitation directed centrally. - Pulmonary arteries: Systolic pressure was severely increased. PA peak pressure: 86 mm Hg (S). - Pericardium, extracardiac: A trivial pericardial effusion was identified.  Patient Profile     84 y.o. female with history of chronic atrial fibrillation, hypertension, hypothyroidism, CKD stage 3, and pulmonary hypertension, currently being managed for acute on chronic combined heart failure.  Assessment & Plan    1.  Chronic atrial fibrillation.  Medications have been modified to twice daily dosing of Lopressor and Cardizem CD for ease of use.  She continues on Coumadin per pharmacy.  2.  Acute on chronic combined heart failure, now tolerating oral Lasix with even intake and output last 24 hours.  3.  Essential hypertension, systolic blood pressure 140s to 150s.  4.  CKD stage 3, creatinine 1.57.  5.  Severe pulmonary hypertension with PASP 86 mmHg by recent echocardiogram.  Continue with current regimen including Cardizem CD 120 mg daily,  Lopressor 100 mg twice daily, and Coumadin per pharmacy.  No change in present diuretic regimen with even intake and output.  Depending on blood pressure and heart rate control, further adjustments can be made.  Signed, Nona DellSamuel Fronia Depass, MD  07/13/2017, 10:13 AM

## 2017-07-13 NOTE — Progress Notes (Signed)
PROGRESS NOTE    Tonya Farley   QVZ:[id number]  DOB: January 10, 1920  DOA: 07/09/2017 PCP: Burton Apley, MD   Brief Narrative:  Tonya Farley is a 84 year old female with a past medical history of systolic and diastolic heart failure, severe pulmonary hypertension, chronic atrial fibrillation on Coumadin, CKD 3, hyperlipidemia, hypothyroidism, hypertension who presented to the hospital with A. fib with RVR with heart rate 180-200 stating that had shortness of breath and she felt like she was going to die.  Oxygen level was 88% on room air.  EMS gave her 15 mg of IV Lopressor which brought her heart rate down to 110s 120s.  Of note she was seen in urgent care for wheezing on 4/13 and was given albuterol inhaler which she was using every 4-6 hours. Cardiology was consulted in the ED. [facility].   Subjective: - Patiet reports she had a good night sleep, reports right knee pain almost resolved, no nausea, no chest pain, no shortness of breath.  Assessment & Plan:   Acute respiratory failure with hypoxia  - multifactorial secondary to acute systolic/diastolic CHF in the setting of uncontrolled A. Fib, with baseline pulmonary hypertension. - resolved, currently on room air -2D echo shows an EF of 30-35%, severe pulmonary hypertension and moderate decrease in RV function -Cardiology managing    Acute on chronic systolic heart failure  -cardiology input is appreciated, now on  oral Lasix, Appears to be a clinic today, continue to monitor renal function closely as creatinine 1-1.7 today   Atrial fibrillation with RVR  -issues requiring Cardizem drip, currently heart rate controlled on oral metoprolol and Cardizem CD - On warfarin for anticoagulation, pharmacy to dose(INR is supratherapeutic   Right knee gout -status post arthrocentesis, received intra-articular steroid injection by orthopedic , patient reports significant improvement of her  pain after receiving injection  Elevated INR -Pharmacy managing Coumadin    Hypothyroidism, adult -Continue Synthroid   CKD (chronic kidney disease) stage 3-4, GFR 15-29 ml/min  -follow with diuresis, creatinine went to 1.78, appears euvolemic, will hold evening dose of Lasix, then reassess in a.m., as well as potassium supplement, given potassium of 5, reassess in a.m., woke up one dose of Kayexalate.      DVT prophylaxis: Coumadin  Code Status: DNR  Family Communication: none at bedside  Disposition Plan: awaiting PT consult  Consultants:   Cardiology  Orthopedic surgery  Procedures:   2D echo Left ventricle: The cavity size was normal. Wall thickness was   normal. Systolic function was moderately reduced. The estimated   ejection fraction was in the range of 35% to 40%. Diffuse   hypokinesis. - Ventricular septum: The contour showed diastolic flattening and   systolic flattening. These changes are consistent with RV volume   and pressure overload. - Aortic valve: Right coronary cusp mobility was mildly restricted. - Mitral valve: There was mild regurgitation. - Left atrium: The atrium was moderately to severely dilated. - Right ventricle: The cavity size was moderately dilated. Systolic   function was moderately reduced. - Right atrium: The atrium was severely dilated. - Tricuspid valve: There was moderate regurgitation directed   centrally. - Pulmonary arteries: Systolic pressure was severely increased. PA   peak pressure: 86 mm Hg (S). - Pericardium, extracardiac: A trivial pericardial effusion was   identified.  Left knee arthrocentesis  Antimicrobials:  Anti-infectives (From admission, onward)   None       Objective: Vitals:   07/12/17  1944 07/13/17 0110 07/13/17 0441 07/13/17 0820  BP: 126/69 (!) 147/75 (!) 156/86 (!) 146/90  Pulse: 100 80 (!) 105 86  Resp: (!) 25 20 (!) 26 (!) 21  Temp: (!) 97.5 F (36.4 C) (!) 97.3 F (36.3 C) (!) 97.4 F  (36.3 C) (!) 97.3 F (36.3 C)  TempSrc: Oral Oral Oral Oral  SpO2: (!) 88% 96% 95% 99%  Weight:   60.6 kg (133 lb 9.6 oz)   Height:        Intake/Output Summary (Last 24 hours) at 07/13/2017 1335 Last data filed at 07/13/2017 0858 Gross per 24 hour  Intake 601 ml  Output 400 ml  Net 201 ml   Filed Weights   07/11/17 0347 07/12/17 0410 07/13/17 0441  Weight: 63 kg (138 lb 14.2 oz) 58.9 kg (129 lb 14.4 oz) 60.6 kg (133 lb 9.6 oz)    Examination:  Elderly female laying in bed in no apparent distress, awake alert 3 Good air entry bilaterally, clear to auscultation, no wheezing rales or rhonchi  S1 & S2 heard, irregular irregular Abdomen soft, non-tender, nondistended. Normal bowel sound. No organomegaly Right knee swelling significantly subsided, nontender to palpation, no edema  No rashes or ulcers  Mood & affect appropriate.     Data Reviewed: I have personally reviewed following labs and imaging studies  CBC: Recent Labs  Lab 07/09/17 1146 07/10/17 0337 07/10/17 2011  WBC 13.4* 12.0* 16.6*  HGB 10.0* 11.9* 12.5  HCT 31.8* 37.5 40.4  MCV 83.2 84.1 83.3  PLT 273 217 236   Basic Metabolic Panel: Recent Labs  Lab 07/09/17 1146 07/10/17 0337 07/10/17 2011 07/11/17 0312 07/12/17 0401 07/13/17 1001  NA 142 142 144 143 142 139  K 3.6 3.4* 4.2 4.2 4.3 5.0  CL 107 106 106 106 102 102  CO2 23 24 23 24 28 26   GLUCOSE 128* 109* 117* 132* 115* 232*  BUN 26* 26* 25* 25* 29* 41*  CREATININE 1.53* 1.42* 1.45* 1.49* 1.57* 1.78*  CALCIUM 9.3 9.2 9.5 9.3 9.6 9.4  MG 2.1  --   --   --   --   --    GFR: Estimated Creatinine Clearance: 16.3 mL/min (A) (by C-G formula based on SCr of 1.78 mg/dL (H)). Liver Function Tests: Recent Labs  Lab 07/09/17 1146 07/10/17 2011  AST 24 24  ALT 20 17  ALKPHOS 73 73  BILITOT 1.1 1.5*  PROT 6.6 6.3*  ALBUMIN 3.5 3.4*   No results for input(s): LIPASE, AMYLASE in the last 168 hours. No results for input(s): AMMONIA in the last  168 hours. Coagulation Profile: Recent Labs  Lab 07/09/17 1146 07/10/17 0337 07/11/17 0312 07/12/17 0401 07/13/17 1001  INR 2.70 2.82 3.27 4.52* 3.90   Cardiac Enzymes: No results for input(s): CKTOTAL, CKMB, CKMBINDEX, TROPONINI in the last 168 hours. BNP (last 3 results) No results for input(s): PROBNP in the last 8760 hours. HbA1C: No results for input(s): HGBA1C in the last 72 hours. CBG: No results for input(s): GLUCAP in the last 168 hours. Lipid Profile: No results for input(s): CHOL, HDL, LDLCALC, TRIG, CHOLHDL, LDLDIRECT in the last 72 hours. Thyroid Function Tests: No results for input(s): TSH, T4TOTAL, FREET4, T3FREE, THYROIDAB in the last 72 hours. Anemia Panel: No results for input(s): VITAMINB12, FOLATE, FERRITIN, TIBC, IRON, RETICCTPCT in the last 72 hours. Urine analysis:    Component Value Date/Time   COLORURINE YELLOW 07/09/2017 1736   APPEARANCEUR CLEAR 07/09/2017 1736   LABSPEC 1.010 07/09/2017  1736   PHURINE 6.0 07/09/2017 1736   GLUCOSEU NEGATIVE 07/09/2017 1736   HGBUR NEGATIVE 07/09/2017 1736   BILIRUBINUR NEGATIVE 07/09/2017 1736   KETONESUR NEGATIVE 07/09/2017 1736   PROTEINUR 30 (A) 07/09/2017 1736   UROBILINOGEN 1.0 01/10/2015 2237   NITRITE NEGATIVE 07/09/2017 1736   LEUKOCYTESUR MODERATE (A) 07/09/2017 1736   Sepsis Labs: @LABRCNTIP (procalcitonin:4,lacticidven:4) ) Recent Results (from the past 240 hour(s))  Culture, Urine     Status: Abnormal   Collection Time: 07/09/17  5:36 PM  Result Value Ref Range Status   Specimen Description URINE, CATHETERIZED  Final   Special Requests   Final    NONE Performed at [facility], [address] [place], [place] [address]    Culture 50,000 COLONIES/mL ESCHERICHIA COLI (A)  Final   Report Status 07/11/2017 FINAL  Final   Organism ID, Bacteria ESCHERICHIA COLI (A)  Final      Susceptibility   Escherichia coli - MIC*    AMPICILLIN 4 SENSITIVE Sensitive     CEFAZOLIN <=4 SENSITIVE  Sensitive     CEFTRIAXONE <=1 SENSITIVE Sensitive     CIPROFLOXACIN >=4 RESISTANT Resistant     GENTAMICIN <=1 SENSITIVE Sensitive     IMIPENEM <=0.25 SENSITIVE Sensitive     NITROFURANTOIN <=16 SENSITIVE Sensitive     TRIMETH/SULFA <=20 SENSITIVE Sensitive     AMPICILLIN/SULBACTAM <=2 SENSITIVE Sensitive     PIP/TAZO <=4 SENSITIVE Sensitive     Extended ESBL NEGATIVE Sensitive     * 50,000 COLONIES/mL ESCHERICHIA COLI  Respiratory Panel by PCR     Status: None   Collection Time: 07/09/17  5:36 PM  Result Value Ref Range Status   Adenovirus NOT DETECTED NOT DETECTED Final   Coronavirus 229E NOT DETECTED NOT DETECTED Final   Coronavirus HKU1 NOT DETECTED NOT DETECTED Final   Coronavirus NL63 NOT DETECTED NOT DETECTED Final   Coronavirus OC43 NOT DETECTED NOT DETECTED Final   Metapneumovirus NOT DETECTED NOT DETECTED Final   Rhinovirus / Enterovirus NOT DETECTED NOT DETECTED Final   Influenza A NOT DETECTED NOT DETECTED Final   Influenza B NOT DETECTED NOT DETECTED Final   Parainfluenza Virus 1 NOT DETECTED NOT DETECTED Final   Parainfluenza Virus 2 NOT DETECTED NOT DETECTED Final   Parainfluenza Virus 3 NOT DETECTED NOT DETECTED Final   Parainfluenza Virus 4 NOT DETECTED NOT DETECTED Final   Respiratory Syncytial Virus NOT DETECTED NOT DETECTED Final   Bordetella pertussis NOT DETECTED NOT DETECTED Final   Chlamydophila pneumoniae NOT DETECTED NOT DETECTED Final   Mycoplasma pneumoniae NOT DETECTED NOT DETECTED Final    Comment: Performed at [facility], [address] [place], [place] [address]  MRSA PCR Screening     Status: None   Collection Time: 07/10/17  3:13 AM  Result Value Ref Range Status   MRSA by PCR NEGATIVE NEGATIVE Final    Comment:        The GeneXpert MRSA Assay (FDA approved for NASAL specimens only), is one component of a comprehensive MRSA colonization surveillance program. It is not intended to diagnose MRSA infection nor to guide or monitor  treatment for MRSA infections. Performed at [facility], [address] [place], [place] [address]   Body fluid culture     Status: None (Preliminary result)   Collection Time: 07/11/17  7:03 PM  Result Value Ref Range Status   Specimen Description SYNOVIAL FLUID  Final   Special Requests NONE  Final  Gram Stain   Final    ABUNDANT WBC PRESENT,BOTH PMN AND MONONUCLEAR NO ORGANISMS SEEN    Culture   Final    NO GROWTH 2 DAYS Performed at [facility], [address] [place], [place] [address]    Report Status PENDING  Incomplete         Radiology Studies: Dg Knee Complete 4 Views Right  Result Date: 07/11/2017 CLINICAL DATA:  Right knee pain and swelling Status post right knee aspiration EXAM: RIGHT KNEE - COMPLETE 4+ VIEW COMPARISON:  None. FINDINGS: Vascular calcifications. Mild 3 compartment osteoarthritis, with subchondral sclerosis and osteophyte formation. Chondrocalcinosis within the lateral and medial menisci. Small suprapatellar joint effusion. IMPRESSION: Degenerative change, without acute osseous finding. Chondrocalcinosis in the menisci, consistent with calcium pyrophosphate deposition disease. Small suprapatellar joint effusion. Electronically Signed   By: Jeronimo GreavesKyle  Talbot M.D.   On: 07/11/2017 20:01      Scheduled Meds: . atorvastatin  10 mg Oral Daily  . brimonidine  1 drop Both Eyes TID  . diltiazem  120 mg Oral Daily  . furosemide  40 mg Oral BID  . latanoprost  1 drop Both Eyes QHS  . levothyroxine  88 mcg Oral QAC breakfast  . lidocaine (PF)  30 mL Infiltration Once  . mouth rinse  15 mL Mouth Rinse BID  . metoprolol tartrate  100 mg Oral BID  . pentoxifylline  400 mg Oral TID WC  . potassium chloride SA  20 mEq Oral BID  . sodium chloride flush  3 mL Intravenous Q12H  . timolol  1 drop Both Eyes q morning - 10a  . Warfarin - Pharmacist Dosing Inpatient   Does not apply q1800   Continuous Infusions: . sodium chloride 250 mL (07/11/17  0710)     LOS: 4 days    Time spent in minutes: 25 min   Huey Bienenstockawood Ahmon Tosi, MD [facility] Pager:[phone] [email or web address] Password [email or web address] 07/13/2017, 1:35 PM

## 2017-07-13 NOTE — Progress Notes (Signed)
ANTICOAGULATION CONSULT NOTE - Pharmacy Consult for warfarin Indication: atrial fibrillation  Allergies  Allergen Reactions  . Penicillins Hives and Swelling    Has patient had a PCN reaction causing immediate rash, facial/tongue/throat swelling, SOB or lightheadedness with hypotension: Yes Has patient had a PCN reaction causing severe rash involving mucus membranes or skin necrosis: Yes Has patient had a PCN reaction that required hospitalization No Has patient had a PCN reaction occurring within the last 10 years: No If all of the above answers are "NO", then may proceed with Cephalosporin use.    . Sulfa Antibiotics Anaphylaxis and Swelling  . Fish Allergy Nausea And Vomiting    Patient Measurements: Height: _0  (162.6 cm) Weight: 133 lb 9.6 oz (60.6 kg) IBW/kg (Calculated) : 54.7  Vital Signs: Temp: 97.3 F (36.3 C) (04/21 0820) Temp Source: Oral (04/21 0820) BP: 146/90 (04/21 0820) Pulse Rate: 86 (04/21 0820)  Labs: Recent Labs    07/10/17 2011 07/11/17 0312 07/12/17 0401 07/13/17 1001  HGB 12.5  --   --   --   HCT 40.4  --   --   --   PLT 236  --   --   --   LABPROT  --  33.1* 42.6* 37.9*  INR  --  3.27 4.52* 3.90  CREATININE 1.45* 1.49* 1.57* 1.78*    Estimated Creatinine Clearance: 16.3 mL/min (A) (by C-G formula based on SCr of 1.78 mg/dL (H)).   Medical History: Past Medical History:  Diagnosis Date  . Chronic a-fib ([place])   . Chronic back pain   . Chronic diastolic (congestive) heart failure (HCC)    a. Echo 3/17: mild LVH, EF 55%, no RWMA, mod AS (mean 10 mmHg, peak 21 mmHg), MAC, mild MR, severe LAE, mod reduced RVSF, mild RAE, mod TR, PASP 65 mmHg  . CKD (chronic kidney disease) stage 4, GFR 15-29 ml/min (HCC) 09/11/2015  . Hyperlipidemia   . Hypertension   . Hypothyroidism   . Pulmonary hypertension (HCC)    PASP 65 mmHg at Echo 3/17  . Thyroid disease     Medications:  Scheduled:  . atorvastatin  10 mg Oral Daily  . brimonidine  1  drop Both Eyes TID  . diltiazem  120 mg Oral Daily  . furosemide  40 mg Oral BID  . latanoprost  1 drop Both Eyes QHS  . levothyroxine  88 mcg Oral QAC breakfast  . lidocaine (PF)  30 mL Infiltration Once  . mouth rinse  15 mL Mouth Rinse BID  . metoprolol tartrate  100 mg Oral BID  . pentoxifylline  400 mg Oral TID WC  . potassium chloride SA  20 mEq Oral BID  . sodium chloride flush  3 mL Intravenous Q12H  . timolol  1 drop Both Eyes q morning - 10a  . warfarin  2.5 mg Oral q1800  . Warfarin - Pharmacist Dosing Inpatient   Does not apply q1800   Assessment: 84 yo female presented to ED on 4/17 with shortness of breath, probably on the morning of admission and generalized weakness over the previous few days.  She has a history of chronic atrial fibrillation on warfarin. Pharmacy consulted 4/17 to dose warfarin. Home dose is 2.5 mg daily. INR on admission was therapeutic at 2.7. Last dose pta was 4/16.   INR supratherapeutic today at 3.90, down from 4.52 s/p 1 dose held.  Pt had only been eating 25% of meals recently, by RN report eating better this AM.  No bleeding observed today.  Goal of Therapy:  INR 2-3 Monitor platelets by anticoagulation protocol: Yes   Plan:  Hold warfarin dose today Monitor daily INR, CBC, s/s bleeding F/u diet for restart and dose  Bertis Ruddy, PharmD Pharmacy Resident Pager #: [phone] 07/13/2017 11:19 AM

## 2017-07-13 NOTE — Evaluation (Signed)
Physical Therapy Evaluation Patient Details Name: Tonya MoatsMamie N Farley MRN: [id number] DOB: 04/16/1919 Today's Date: 07/13/2017   History of Present Illness  Tonya Farley is a 84 year old female with a past medical history of systolic and diastolic heart failure, severe pulmonary hypertension, chronic atrial fibrillation on Coumadin, CKD 3, hyperlipidemia, hypothyroidism, hypertension who presented to the hospital with A. fib with RVR with heart rate 180-200 stating that had shortness of breath  Clinical Impression   Pt admitted with above diagnosis. Pt currently with functional limitations due to the deficits listed below (see PT Problem List). Presents with overall decr activity tolerance; Very much wants to dc hoem, and that is not unreasonable; we discussed getting some more help in the home; Will place OT order for ADLs and to help discern dc plan as well;  Pt will benefit from skilled PT to increase their independence and safety with mobility to allow discharge to the venue listed below.       Follow Up Recommendations Home health PT;Other (comment)(Ms. Honeycut VERY MUCH wants to dc home; she lives alone, and will need to show significant improvements in functional mobility )    Equipment Recommendations  None recommended by PT    Recommendations for Other Services OT consult(for ADLs and to help discern dc plan)     Precautions / Restrictions Precautions Precautions: Fall      Mobility  Bed Mobility Overal bed mobility: Needs Assistance Bed Mobility: Supine to Sit     Supine to sit: Min guard     General bed mobility comments: minguard for safety  Transfers Overall transfer level: Needs assistance Equipment used: Rolling walker (2 wheeled) Transfers: Sit to/from Stand Sit to Stand: Min assist         General transfer comment: min assis tto steady RW (she normally pulls up on her Rollator at home); cues for hand placement; better rise from [facility], pushing up from  armrests  Ambulation/Gait Ambulation/Gait assistance: Min assist Ambulation Distance (Feet): (pivot steps bed to [facility] to recliner) Assistive device: Rolling walker (2 wheeled)       General Gait Details: Cues to self-monitor for activity tolerance  Stairs            Wheelchair Mobility    Modified Rankin (Stroke Patients Only)       Balance Overall balance assessment: Needs assistance           Standing balance-Leahy Scale: Fair                               Pertinent Vitals/Pain Pain Assessment: No/denies pain    Home Living Family/patient expects to be discharged to:: Private residence Living Arrangements: Alone Available Help at Discharge: Available PRN/intermittently;Family;Friend(s) Type of Home: House Home Access: Stairs to enter Entrance Stairs-Rails: Left Entrance Stairs-Number of Steps: 2 Home Layout: One level Home Equipment: Walker - 2 wheels;Walker - 4 wheels;Cane - single point      Prior Function Level of Independence: Independent with assistive device(s)         Comments: uses cane in house, walker in public, drives, does shopping at [employer], has cleaning lady who does grocery shopping     Hand Dominance   Dominant Hand: Right    Extremity/Trunk Assessment   Upper Extremity Assessment Upper Extremity Assessment: Defer to OT evaluation    Lower Extremity Assessment Lower Extremity Assessment: Generalized weakness       Communication   Communication:  No difficulties  Cognition Arousal/Alertness: Awake/alert Behavior During Therapy: WFL for tasks assessed/performed Overall Cognitive Status: Within Functional Limits for tasks assessed                                        General Comments General comments (skin integrity, edema, etc.): VSS on room air; pt moved her bowels, RN notified    Exercises     Assessment/Plan    PT Assessment Patient needs continued PT services  PT Problem  List Decreased strength;Decreased activity tolerance;Decreased balance;Decreased mobility;Decreased knowledge of use of DME;Decreased knowledge of precautions       PT Treatment Interventions DME instruction;Gait training;Stair training;Functional mobility training;Therapeutic activities;Therapeutic exercise;Balance training;Patient/family education    PT Goals (Current goals can be found in the Care Plan section)  Acute Rehab PT Goals Patient Stated Goal: Home PT Goal Formulation: With patient Time For Goal Achievement: 07/27/17 Potential to Achieve Goals: Good    Frequency Min 3X/week   Barriers to discharge Decreased caregiver support Lives alone, must be able to manage independently    Co-evaluation               AM-PAC PT "6 Clicks" Daily Activity  Outcome Measure Difficulty turning over in bed (including adjusting bedclothes, sheets and blankets)?: None Difficulty moving from lying on back to sitting on the side of the bed? : A Little Difficulty sitting down on and standing up from a chair with arms (e.g., wheelchair, bedside commode, etc,.)?: A Little Help needed moving to and from a bed to chair (including a wheelchair)?: A Little Help needed walking in hospital room?: A Little Help needed climbing 3-5 steps with a railing? : A Little 6 Click Score: 19    End of Session Equipment Utilized During Treatment: Gait belt Activity Tolerance: Patient tolerated treatment well Patient left: in chair;with call bell/phone within reach;with chair alarm set Nurse Communication: Mobility status PT Visit Diagnosis: Muscle weakness (generalized) (M62.81)    Time: 8119-1478 PT Time Calculation (min) (ACUTE ONLY): 28 min   Charges:   PT Evaluation $PT Eval Moderate Complexity: 1 Mod PT Treatments $Therapeutic Activity: 8-22 mins   PT G Codes:        Van Clines, PT  Acute Rehabilitation Services Pager [phone] Office [phone]   Levi Aland 07/13/2017, 4:08  PM

## 2017-07-14 LAB — BASIC METABOLIC PANEL
Anion gap: 9 (ref 5–15)
BUN: 42 mg/dL — ABNORMAL HIGH (ref 6–20)
CHLORIDE: 103 mmol/L (ref 101–111)
CO2: 27 mmol/L (ref 22–32)
Calcium: 9.4 mg/dL (ref 8.9–10.3)
Creatinine, Ser: 1.54 mg/dL — ABNORMAL HIGH (ref 0.44–1.00)
GFR calc non Af Amer: 28 mL/min — ABNORMAL LOW (ref >60)
GFR, EST AFRICAN AMERICAN: 32 mL/min — AB (ref >60)
GLUCOSE: 130 mg/dL — AB (ref 65–99)
Potassium: 4.5 mmol/L (ref 3.5–5.1)
Sodium: 139 mmol/L (ref 135–145)

## 2017-07-14 LAB — PROTIME-INR
INR: 2.86
Prothrombin Time: 29.8 seconds — ABNORMAL HIGH (ref 11.4–15.2)

## 2017-07-14 MED ORDER — WARFARIN SODIUM 2.5 MG PO TABS: 2.5000 mg | ORAL_TABLET | Freq: Every day | ORAL | Status: DC

## 2017-07-14 MED ORDER — FUROSEMIDE 40 MG PO TABS
40.0000 mg | ORAL_TABLET | Freq: Two times a day (BID) | ORAL | Status: DC
  Administered 2017-07-14: 40 mg via ORAL
  Filled 2017-07-14: qty 1

## 2017-07-14 MED ORDER — METOPROLOL TARTRATE 100 MG PO TABS: 100.0000 mg | ORAL_TABLET | Freq: Two times a day (BID) | ORAL | Status: DC

## 2017-07-14 MED ORDER — DILTIAZEM HCL ER COATED BEADS 180 MG PO CP24: 180.0000 mg | ORAL_CAPSULE | Freq: Every day | ORAL | Status: DC

## 2017-07-14 MED ORDER — LEVALBUTEROL HCL 0.63 MG/3ML IN NEBU: 0.6300 mg | INHALATION_SOLUTION | Freq: Four times a day (QID) | RESPIRATORY_TRACT | 2 refills | Status: DC | PRN

## 2017-07-14 MED ORDER — ONDANSETRON 4 MG PO TBDP
4.0000 mg | ORAL_TABLET | Freq: Once | ORAL | Status: AC
  Administered 2017-07-14: 4 mg via ORAL
  Filled 2017-07-14: qty 1

## 2017-07-14 MED ORDER — DILTIAZEM HCL ER COATED BEADS 120 MG PO CP24: 120.0000 mg | ORAL_CAPSULE | Freq: Every day | ORAL | Status: DC

## 2017-07-14 MED ORDER — POTASSIUM CHLORIDE CRYS ER 20 MEQ PO TBCR: 10.0000 meq | EXTENDED_RELEASE_TABLET | Freq: Every day | ORAL | Status: DC

## 2017-07-14 MED ORDER — FUROSEMIDE 80 MG PO TABS: 80.0000 mg | ORAL_TABLET | Freq: Two times a day (BID) | ORAL | Status: DC

## 2017-07-14 NOTE — Progress Notes (Signed)
R knee fluid with (+) MSU crystals - diagnostic of gout. Knee feels much better after injection. Follow culture: NGTD. Will sign off. Call with questions.

## 2017-07-14 NOTE — Progress Notes (Signed)
Patient has bed offer from preferred facility, [facility]. Facility ready to take patient today. CSW to support with discharge.  Abigail ButtsSusan Tensley Wery, LCSWA [phone]

## 2017-07-14 NOTE — Progress Notes (Signed)
Physical Therapy Treatment Patient Details Name: Tonya Farley MRN: [id number] DOB: 03/23/1920 Today's Date: 07/14/2017    History of Present Illness Tonya Farley is a 84 year old female with a past medical history of systolic and diastolic heart failure, severe pulmonary hypertension, chronic atrial fibrillation on Coumadin, CKD 3, hyperlipidemia, hypothyroidism, hypertension who presented to the hospital with A. fib with RVR with heart rate 180-200 stating that had shortness of breath    PT Comments    Continuing work on functional mobility and activity tolerance;  Walked to and from the bathroom with a standing break to wash hands at the sink; tending to toe-out; short steps, trunk flexed; Pt agreeable to short-term SNF for rehab to maximize independence and safety with mobility prior to dc home -- I believe this will be beneficial for her, given she lives alone; Overall progressing well; Anticipate continuing good progress at post-acute rehabilitation.    Follow Up Recommendations  SNF     Equipment Recommendations  None recommended by PT    Recommendations for Other Services       Precautions / Restrictions Precautions Precautions: Fall    Mobility  Bed Mobility                  Transfers Overall transfer level: Needs assistance Equipment used: Rolling walker (2 wheeled) Transfers: Sit to/from Stand Sit to Stand: Min assist         General transfer comment: min assist to steady RW; pulls up on RW despite cues for correct hand placement  Ambulation/Gait Ambulation/Gait assistance: Min guard Ambulation Distance (Feet): 20 Feet Assistive device: Rolling walker (2 wheeled)(to and from bathroom) Gait Pattern/deviations: Step-through pattern;Decreased step length - right;Decreased step length - left;Trunk flexed     General Gait Details: Cues to self-monitor for activity tolerance   Stairs             Wheelchair Mobility    Modified Rankin  (Stroke Patients Only)       Balance     Sitting balance-Leahy Scale: Fair       Standing balance-Leahy Scale: Fair                              Cognition Arousal/Alertness: Awake/alert Behavior During Therapy: WFL for tasks assessed/performed Overall Cognitive Status: Within Functional Limits for tasks assessed                                        Exercises      General Comments        Pertinent Vitals/Pain Pain Assessment: No/denies pain    Home Living                      Prior Function            PT Goals (current goals can now be found in the care plan section) Acute Rehab PT Goals Patient Stated Goal: Home PT Goal Formulation: With patient Time For Goal Achievement: 07/27/17 Potential to Achieve Goals: Good Progress towards PT goals: Progressing toward goals    Frequency    Min 3X/week      PT Plan Discharge plan needs to be updated    Co-evaluation              AM-PAC PT "6 Clicks" Daily Activity  Outcome Measure  Difficulty turning over in bed (including adjusting bedclothes, sheets and blankets)?: None Difficulty moving from lying on back to sitting on the side of the bed? : A Little Difficulty sitting down on and standing up from a chair with arms (e.g., wheelchair, bedside commode, etc,.)?: A Little Help needed moving to and from a bed to chair (including a wheelchair)?: A Little Help needed walking in hospital room?: A Little Help needed climbing 3-5 steps with a railing? : A Little 6 Click Score: 19    End of Session Equipment Utilized During Treatment: Gait belt Activity Tolerance: Patient tolerated treatment well Patient left: in chair;with call bell/phone within reach;with chair alarm set Nurse Communication: Mobility status PT Visit Diagnosis: Muscle weakness (generalized) (M62.81)     Time: 1328-1351(minus approx 3-5 minutes while pt was on commode) PT Time Calculation (min)  (ACUTE ONLY): 23 min  Charges:  $Gait Training: 8-22 mins                    G Codes:       Van Clines, PT  Acute Rehabilitation Services Pager [phone] Office [phone]    Levi Aland 07/14/2017, 4:08 PM

## 2017-07-14 NOTE — [place] FL2 (Signed)
[place] MEDICAID FL2 LEVEL OF CARE SCREENING TOOL     IDENTIFICATION  Patient Name: Tonya MoatsMamie N Marinos Birthdate: 03/18/1920 Sex: female Admission Date (Current Location): 07/09/2017  [facility] [place] [place]   Facility and Address:  [facility] [place], [address], [place], [place] [address]      Provider Number: [id number]  Attending Physician Name and Address:  Starleen ArmsElgergawy, Dawood S, MD  Relative Name and Phone Number:  Army ChacoJanie Darr, niece, [phone]    Current Level of Care: Hospital Recommended Level of Care: Skilled Nursing Facility Prior Approval Number:    Date Approved/Denied:   PASRR Number: [id number]  Discharge Plan: SNF    Current Diagnoses: Patient Active Problem List   Diagnosis Date Noted  . Acute on chronic systolic heart failure (HCC) 07/09/2017  . Atrial fibrillation with RVR (HCC) 07/09/2017  . Hypothyroidism, adult 07/09/2017  . Pulmonary HTN (HCC) 07/09/2017  . Leukocytosis 07/09/2017  . Acute respiratory failure with hypoxia (HCC) 07/09/2017  . High blood pressure 07/09/2017  . Chronic systolic heart failure (HCC)   . Acute on chronic right heart failure (HCC) 12/22/2016  . CKD (chronic kidney disease), stage III w/ renal artery stenosis 12/22/2016  . Cellulitis of lower extremity 12/22/2016  . Chronic a-fib (HCC) 12/22/2016  . Hypothyroidism 12/22/2016  . Acute diastolic heart failure (HCC) 12/22/2016  . Elevated troponin 12/22/2016  . Acute hypokalemia 12/22/2016  . HTN (hypertension) 12/22/2016  . Dyslipidemia 12/22/2016  . Pulmonary hypertension, moderate to severe (HCC) 12/22/2016  . Abnormal urinalysis 12/22/2016  . Hypertensive heart disease 05/08/2016  . Chronic diastolic heart failure (HCC) 11/28/2015  . Anticoagulated 11/28/2015  . CKD (chronic kidney disease) stage 3, GFR 30-59 ml/min (HCC) 09/11/2015  . CKD (chronic kidney disease) stage 4, GFR 15-29 ml/min (HCC) 09/11/2015  . Pulmonary  hypertension (HCC)   . Pleural effusion on left   . Peripheral edema   . Hypokalemia   . Anasarca 08/10/2015  . Acute on chronic respiratory failure with hypoxia (HCC)   . Pleural effusion   . Physical deconditioning   . Cough   . HCAP (healthcare-associated pneumonia)   . Chest congestion   . SOB (shortness of breath)   . Swelling   . Acute on chronic diastolic heart failure (HCC)   . HLD (hyperlipidemia)   . Influenza due to identified novel influenza A virus with other respiratory manifestations 06/07/2015  . Pleural effusion, bacterial   . Dyspnea   . AKI (acute kidney injury) (HCC)   . CAP (community acquired pneumonia) 05/30/2015  . Renal artery stenosis (HCC) 08/08/2014  . Encounter for therapeutic drug monitoring 04/21/2013  . Hypertension 06/25/2012  . Hyperlipidemia 06/25/2012  . Atrial fibrillation (HCC) 06/25/2012  . Compression fracture of lumbar spine, non-traumatic (HCC) 06/25/2012  . Chronic back pain     Orientation RESPIRATION BLADDER Height & Weight     Self, Time, Situation, Place  Normal Incontinent, External catheter Weight: 133 lb 11.2 oz (60.6 kg) Height:  5\' 4"  (162.6 cm)  BEHAVIORAL SYMPTOMS/MOOD NEUROLOGICAL BOWEL NUTRITION STATUS      Continent Diet(please see DC summary)  AMBULATORY STATUS COMMUNICATION OF NEEDS Skin   Limited Assist Verbally Normal                       Personal Care Assistance Level of Assistance  Bathing, Feeding, Dressing Bathing Assistance: Limited assistance Feeding assistance: Independent Dressing Assistance: Limited assistance     Functional Limitations  Info  Sight, Hearing, Speech Sight Info: Adequate Hearing Info: Adequate Speech Info: Adequate    SPECIAL CARE FACTORS FREQUENCY  PT (By licensed PT), OT (By licensed OT)     PT Frequency: 5x/week OT Frequency: 5x/week            Contractures Contractures Info: Not present    Additional Factors Info  Code Status, Allergies Code Status Info:  DNR Allergies Info: Penicillins, Sulfa Antibiotics, Fish Allergy           Current Medications (07/14/2017):  This is the current hospital active medication list Current Facility-Administered Medications  Medication Dose Route Frequency Provider Last Rate Last Dose  . 0.9 %  sodium chloride infusion  250 mL Intravenous PRN Russella Dar, NP 5 mL/hr at 07/11/17 0710 250 mL at 07/11/17 0710  . acetaminophen (TYLENOL) tablet 650 mg  650 mg Oral Q4H PRN Russella Dar, NP      . atorvastatin (LIPITOR) tablet 10 mg  10 mg Oral Daily Russella Dar, NP   10 mg at 07/14/17 0753  . brimonidine (ALPHAGAN) 0.15 % ophthalmic solution 1 drop  1 drop Both Eyes TID Russella Dar, NP   1 drop at 07/14/17 0755  . [START ON 07/15/2017] diltiazem (CARDIZEM CD) 24 hr capsule 120 mg  120 mg Oral Daily Dietrich Pates V, MD      . furosemide (LASIX) tablet 80 mg  80 mg Oral BID Pricilla Riffle, MD      . latanoprost (XALATAN) 0.005 % ophthalmic solution 1 drop  1 drop Both Eyes QHS Russella Dar, NP   1 drop at 07/13/17 2124  . levalbuterol (XOPENEX) nebulizer solution 0.63 mg  0.63 mg Nebulization Q4H PRN Rai, Ripudeep K, MD      . levothyroxine (SYNTHROID, LEVOTHROID) tablet 88 mcg  88 mcg Oral QAC breakfast Russella Dar, NP   88 mcg at 07/14/17 0752  . lidocaine (PF) (XYLOCAINE) 1 % injection 30 mL  30 mL Infiltration Once Samson Frederic, MD      . MEDLINE mouth rinse  15 mL Mouth Rinse BID Tyrone Nine, MD   15 mL at 07/14/17 0758  . metoprolol tartrate (LOPRESSOR) tablet 100 mg  100 mg Oral BID Jonelle Sidle, MD   100 mg at 07/14/17 0753  . ondansetron (ZOFRAN) injection 4 mg  4 mg Intravenous Q6H PRN Russella Dar, NP   4 mg at 07/10/17 1952  . pentoxifylline (TRENTAL) CR tablet 400 mg  400 mg Oral TID WC Russella Dar, NP   400 mg at 07/14/17 0752  . sodium chloride flush (NS) 0.9 % injection 3 mL  3 mL Intravenous Q12H Russella Dar, NP   3 mL at 07/12/17 2208  . sodium chloride  flush (NS) 0.9 % injection 3 mL  3 mL Intravenous PRN Russella Dar, NP      . timolol (TIMOPTIC) 0.5 % ophthalmic solution 1 drop  1 drop Both Eyes q morning - 10a Russella Dar, NP   1 drop at 07/14/17 0755  . Warfarin - Pharmacist Dosing Inpatient   Does not apply q1800 Russella Dar, NP         Discharge Medications: Please see discharge summary for a list of discharge medications.  Relevant Imaging Results:  Relevant Lab Results:   Additional Information SSN: [id number]  Abigail Butts, LCSW

## 2017-07-14 NOTE — Progress Notes (Signed)
Progress Note  Patient Name: Tonya Farley Date of Encounter: 07/14/2017  Primary Cardiologist: Dr. Everette Rank  Subjective   PT denies CP  Breathing is OK     Inpatient Medications    Scheduled Meds: . atorvastatin  10 mg Oral Daily  . brimonidine  1 drop Both Eyes TID  . diltiazem  120 mg Oral Daily  . furosemide  40 mg Oral BID  . latanoprost  1 drop Both Eyes QHS  . levothyroxine  88 mcg Oral QAC breakfast  . lidocaine (PF)  30 mL Infiltration Once  . mouth rinse  15 mL Mouth Rinse BID  . metoprolol tartrate  100 mg Oral BID  . pentoxifylline  400 mg Oral TID WC  . sodium chloride flush  3 mL Intravenous Q12H  . timolol  1 drop Both Eyes q morning - 10a  . Warfarin - Pharmacist Dosing Inpatient   Does not apply q1800   Continuous Infusions: . sodium chloride 250 mL (07/11/17 0710)   PRN Meds: sodium chloride, acetaminophen, levalbuterol, ondansetron (ZOFRAN) IV, sodium chloride flush   Vital Signs    Vitals:   07/13/17 1939 07/14/17 0026 07/14/17 0458 07/14/17 0801  BP: 129/78 (!) 148/84 (!) 155/92 (!) 155/90  Pulse: 85 65 77 (!) 106  Resp: 16 15 16    Temp: (!) 97.5 F (36.4 C) (!) 97.4 F (36.3 C) 97.8 F (36.6 C) 97.8 F (36.6 C)  TempSrc: Oral Oral Oral Oral  SpO2: 100% 94% 96% 97%  Weight:   133 lb 11.2 oz (60.6 kg)   Height:        Intake/Output Summary (Last 24 hours) at 07/14/2017 0816 Last data filed at 07/14/2017 0500 Gross per 24 hour  Intake 360 ml  Output 800 ml  Net -440 ml   Net I/O  2.L negative   Filed Weights   07/12/17 0410 07/13/17 0441 07/14/17 0458  Weight: 129 lb 14.4 oz (58.9 kg) 133 lb 9.6 oz (60.6 kg) 133 lb 11.2 oz (60.6 kg)    Telemetry     Atrial fib   80s  Personally reviewed.  Physical Exam   GEN:  Elderly woman, no acute distress.   Neck: JVP is increased   Cardiac:  Irregularly irregular, no gallop.  Respiratory:  Rel clear   GI: Soft, nontender, bowel sounds present.  Neuro:  Nonfocal. Psych:  Alert and oriented x 3. Normal affect.  Labs    Chemistry Recent Labs  Lab 07/09/17 1146  07/10/17 2011  07/12/17 0401 07/13/17 1001 07/14/17 0649  NA 142   < > 144   < > 142 139 139  K 3.6   < > 4.2   < > 4.3 5.0 4.5  CL 107   < > 106   < > 102 102 103  CO2 23   < > 23   < > 28 26 27   GLUCOSE 128*   < > 117*   < > 115* 232* 130*  BUN 26*   < > 25*   < > 29* 41* 42*  CREATININE 1.53*   < > 1.45*   < > 1.57* 1.78* 1.54*  CALCIUM 9.3   < > 9.5   < > 9.6 9.4 9.4  PROT 6.6  --  6.3*  --   --   --   --   ALBUMIN 3.5  --  3.4*  --   --   --   --   AST 24  --  24  --   --   --   --   ALT 20  --  17  --   --   --   --   ALKPHOS 73  --  73  --   --   --   --   BILITOT 1.1  --  1.5*  --   --   --   --   GFRNONAA 28*   < > 30*   < > 27* 23* 28*  GFRAA 32*   < > 34*   < > 31* 27* 32*  ANIONGAP 12   < > 15   < > 12 11 9    < > = values in this interval not displayed.     Hematology Recent Labs  Lab 07/09/17 1146 07/10/17 0337 07/10/17 2011  WBC 13.4* 12.0* 16.6*  RBC 3.82* 4.46 4.85  HGB 10.0* 11.9* 12.5  HCT 31.8* 37.5 40.4  MCV 83.2 84.1 83.3  MCH 26.2 26.7 25.8*  MCHC 31.4 31.7 30.9  RDW 18.4* 18.3* 17.9*  PLT 273 217 236    Cardiac EnzymesNo results for input(s): TROPONINI in the last 168 hours.  Recent Labs  Lab 07/09/17 1201  TROPIPOC 0.02     BNP Recent Labs  Lab 07/09/17 1146  BNP 897.3*     Radiology    No results found.  Cardiac Studies   Echocardiogram 07/11/2017: Study Conclusions  - Left ventricle: The cavity size was normal. Wall thickness was   normal. Systolic function was moderately reduced. The estimated   ejection fraction was in the range of 35% to 40%. Diffuse   hypokinesis. - Ventricular septum: The contour showed diastolic flattening and   systolic flattening. These changes are consistent with RV volume   and pressure overload. - Aortic valve: Right coronary cusp mobility was mildly restricted. - Mitral valve: There was mild  regurgitation. - Left atrium: The atrium was moderately to severely dilated. - Right ventricle: The cavity size was moderately dilated. Systolic   function was moderately reduced. - Right atrium: The atrium was severely dilated. - Tricuspid valve: There was moderate regurgitation directed   centrally. - Pulmonary arteries: Systolic pressure was severely increased. PA   peak pressure: 86 mm Hg (S). - Pericardium, extracardiac: A trivial pericardial effusion was   identified.  Patient Profile     84 y.o. female with history of chronic atrial fibrillation, hypertension, hypothyroidism, CKD stage 3, and pulmonary hypertension, currently being managed for acute on chronic combined heart failure.  Assessment & Plan    1.  Chronic atrial fibrillation.  Continue rate control and coumadin  Average HR is OK   Cont coumadin    2.  Acute on chronic combined heart failure.  Volume still mildly increased   Had been on 80 mg po lasix at home   I would continue    Pt weighs herself daily at home      3.  Essential hypertension,   BP is mildly increased  WIll need to follow    4.  CKD stage 3,  Cr stable at 1.54    5.  Severe pulmonary hypertension, PASP 86 mmHg by recent echocardiogram.  Signed, Dietrich PatesPaula Ross, MD  07/14/2017, 8:16 AM

## 2017-07-14 NOTE — Clinical Social Work Note (Signed)
Clinical Social Work Assessment  Patient Details  Name: Tonya Farley MRN: [id number] Date of Birth: Aug 20, 1919  Date of referral:  07/14/17               Reason for consult:  Facility Placement                Permission sought to share information with:  Facility Sport and exercise psychologist, Family Supports Permission granted to share information::  Yes, Verbal Permission Granted  Name::     Tonya Farley  Agency::  SNF[facility]  Relationship::  niece  Contact Information:  [phone]  Housing/Transportation Living arrangements for the past 2 months:  Single Family Home Source of Information:  Patient, Other (Comment Required)(niece) Patient Interpreter Needed:  None Criminal Activity/Legal Involvement Pertinent to Current Situation/Hospitalization:  No - Comment as needed Significant Relationships:  Other Family Members Lives with:  Self Do you feel safe going back to the place where you live?  Yes Need for family participation in patient care:  No (Coment)  Care giving concerns: Patient from home independently. PT recommending SNF.   Social Worker assessment / plan: CSW met with patient and niece at bedside. CSW discussed recommendation for SNF. Patient and niece agreeable to short term SNF and prefer [facility]. CSW sent out initial referrals and called [facility] admission. [facility] to review referral and follow up with CSW. CSW awaiting bed offers. CSW to support with discharge when SNF bed identified.  Employment status:  Retired Forensic scientist:  Medicare PT Recommendations:  [place] / Referral to community resources:  [place]  Patient/Family's Response to care: Patient and niece appreciative of care.  Patient/Family's Understanding of and Emotional Response to Diagnosis, Current Treatment, and Prognosis:  Patient and niece aware of patient's condition and recommendation for SNF. They are agreeable to SNF and prefer  [facility].  Emotional Assessment Appearance:  Appears stated age Attitude/Demeanor/Rapport:  Engaged Affect (typically observed):  Accepting, Calm, Pleasant Orientation:  Oriented to Self, Oriented to Place, Oriented to  Time, Oriented to Situation Alcohol / Substance use:  Not Applicable Psych involvement (Current and /or in the community):  No (Comment)  Discharge Needs  Concerns to be addressed:  Discharge Planning Concerns, Care Coordination Readmission within the last 30 days:  No Current discharge risk:  Physical Impairment, Lives alone Barriers to Discharge:  Continued Medical Work up   Estanislado Emms, LCSW 07/14/2017, 10:35 AM

## 2017-07-14 NOTE — Progress Notes (Signed)
Patient will discharge to [facility]. Anticipated discharge date: 07/14/17 Family notified: Army ChacoJanie Darr, niece Transportation by: [facility]  Nurse to call report to [phone]. Patient will go to room 401B at the facility.   CSW signing off.  Abigail ButtsSusan Jameeka Marcy, LCSWA  Clinical Social Worker

## 2017-07-14 NOTE — Care Management Important Message (Signed)
Important Message  Patient Details  Name: Driscilla MoatsMamie N Heffington MRN: [id number] Date of Birth: 05/11/1919   Medicare Important Message Given:  Yes    Edgard Debord P Tiffay Pinette 07/14/2017, 3:53 PM

## 2017-07-14 NOTE — Evaluation (Signed)
Occupational Therapy Evaluation Patient Details Name: Tonya Farley MRN: [id number] DOB: 1919/06/16 Today's Date: 07/14/2017    History of Present Illness Tonya Farley is a 84 year old female with a past medical history of systolic and diastolic heart failure, severe pulmonary hypertension, chronic atrial fibrillation on Coumadin, CKD 3, hyperlipidemia, hypothyroidism, hypertension who presented to the hospital with A. fib with RVR with heart rate 180-200 stating that had shortness of breath   Clinical Impression   PTA, pt was independent with basic ADL and functional mobility. Pt currently requires overall min assist for toilet transfers, toileting hygiene, and LB ADL due to significant weakness, decreased activity tolerance, and decreased balance. She is motivated to improve independence with ADL participation. Pt lives alone and does not have 24 hour assistance available post-acute D/C. At current functional level, recommend continued rehabilitation at SNF level prior to D/C home to maximize functional independence. All further OT needs will be met at next venue of care. Acute OT will defer further assessment and treatment to SNF.    Follow Up Recommendations  SNF;Supervision/Assistance - 24 hour    Equipment Recommendations  Other (comment)(defer to next venue of care)    Recommendations for Other Services       Precautions / Restrictions Precautions Precautions: Fall Restrictions Weight Bearing Restrictions: No      Mobility Bed Mobility Overal bed mobility: Needs Assistance Bed Mobility: Supine to Sit     Supine to sit: Min guard     General bed mobility comments: Min guard assist for safety.   Transfers Overall transfer level: Needs assistance Equipment used: Rolling walker (2 wheeled) Transfers: Sit to/from Stand Sit to Stand: Min assist         General transfer comment: Min assist to steady and maintain balance. Requiring max cues to power up safely  from bed rather than pulling on RW.     Balance Overall balance assessment: Needs assistance Sitting-balance support: No upper extremity supported;Feet supported Sitting balance-Leahy Scale: Fair     Standing balance support: Bilateral upper extremity supported;No upper extremity supported;During functional activity Standing balance-Leahy Scale: Fair                             ADL either performed or assessed with clinical judgement   ADL Overall ADL's : Needs assistance/impaired Eating/Feeding: Set up;Sitting   Grooming: Minimal assistance;Standing   Upper Body Bathing: Min guard;Sitting   Lower Body Bathing: Minimal assistance;Sit to/from stand   Upper Body Dressing : Min guard;Sitting   Lower Body Dressing: Minimal assistance;Sit to/from stand   Toilet Transfer: Minimal assistance;Ambulation;BSC;RW Toilet Transfer Details (indicate cue type and reason): Assist to manage RW and maintain balance.  Toileting- Clothing Manipulation and Hygiene: Minimal assistance;Sit to/from stand       Functional mobility during ADLs: Minimal assistance;Rolling walker General ADL Comments: Pt with limited ability to participate in ADL due to decreased activity tolerance.      Vision Patient Visual Report: No change from baseline Vision Assessment?: No apparent visual deficits     Perception     Praxis      Pertinent Vitals/Pain Pain Assessment: No/denies pain     Hand Dominance Right   Extremity/Trunk Assessment Upper Extremity Assessment Upper Extremity Assessment: Generalized weakness   Lower Extremity Assessment Lower Extremity Assessment: Generalized weakness       Communication Communication Communication: No difficulties   Cognition Arousal/Alertness: Awake/alert Behavior During Therapy: WFL for tasks assessed/performed Overall Cognitive  Status: Within Functional Limits for tasks assessed                                     General  Comments       Exercises     Shoulder Instructions      Home Living Family/patient expects to be discharged to:: Private residence Living Arrangements: Alone Available Help at Discharge: Available PRN/intermittently;Family;Friend(s) Type of Home: House Home Access: Stairs to enter [employer] of Steps: 2 Entrance Stairs-Rails: Left Home Layout: One level     Bathroom Shower/Tub: Teacher, early years/pre: Handicapped height     Home Equipment: Environmental consultant - 2 wheels;Walker - 4 wheels;Cane - single point   Additional Comments: Pt reports driving      Prior Functioning/Environment Level of Independence: Independent with assistive device(s)        Comments: Reports using cane or RW in her home. Initially reporting to OT that she uses cane in public but this differs from PT note. Drives to church and to limited stores. Has a lady who does cleaning and assists with shopping but only 2 days per week.         OT Problem List: Decreased strength;Decreased range of motion;Decreased activity tolerance;Impaired balance (sitting and/or standing);Decreased safety awareness;Decreased knowledge of use of DME or AE;Decreased knowledge of precautions;Pain      OT Treatment/Interventions:      OT Goals(Current goals can be found in the care plan section) Acute Rehab OT Goals Patient Stated Goal: Home OT Goal Formulation: With patient Time For Goal Achievement: 07/28/17 Potential to Achieve Goals: Good  OT Frequency:     Barriers to D/C:            Co-evaluation              AM-PAC PT "6 Clicks" Daily Activity     Outcome Measure Help from another person eating meals?: A Little Help from another person taking care of personal grooming?: A Little Help from another person toileting, which includes using toliet, bedpan, or urinal?: A Little Help from another person bathing (including washing, rinsing, drying)?: A Little Help from another person to put on  and taking off regular upper body clothing?: A Little Help from another person to put on and taking off regular lower body clothing?: A Little 6 Click Score: 18   End of Session Equipment Utilized During Treatment: Gait belt;Rolling walker Nurse Communication: Mobility status  Activity Tolerance: Patient tolerated treatment well Patient left:    OT Visit Diagnosis: Other abnormalities of gait and mobility (R26.89);Muscle weakness (generalized) (M62.81)                Time: 1030-1055 OT Time Calculation (min): 25 min Charges:  OT General Charges $OT Visit: 1 Visit OT Evaluation $OT Eval Moderate Complexity: 1 Mod OT Treatments $Self Care/Home Management : 8-22 mins G-Codes:     Norman Herrlich, MS OTR/L  Pager: [place] A Tonya Farley 07/14/2017, 1:22 PM

## 2017-07-14 NOTE — Clinical Social Work Placement (Signed)
   CLINICAL SOCIAL WORK PLACEMENT  NOTE  Date:  07/14/2017  Patient Details  Name: Tonya MoatsMamie N Oliveto MRN: [id number] Date of Birth: 04/19/1919  Clinical Social Work is seeking post-discharge placement for this patient at the Skilled  Nursing Facility level of care (*CSW will initial, date and re-position this form in  chart as items are completed):  Yes   Patient/family provided with [facility] Clinical Social Work Department's list of facilities offering this level of care within the geographic area requested by the patient (or if unable, by the patient's family).  Yes   Patient/family informed of their freedom to choose among providers that offer the needed level of care, that participate in Medicare, Medicaid or managed care program needed by the patient, have an available bed and are willing to accept the patient.  Yes   Patient/family informed of [facility]'s ownership interest in [facility] and [facility], as well as of the fact that they are under no obligation to receive care at these facilities.  PASRR submitted to EDS on       PASRR number received on       Existing PASRR number confirmed on 07/14/17     FL2 transmitted to all facilities in geographic area requested by pt/family on 07/14/17     FL2 transmitted to all facilities within larger geographic area on       Patient informed that his/her managed care company has contracts with or will negotiate with certain facilities, including the following:  [facility]     Yes   Patient/family informed of bed offers received.  Patient chooses bed at [facility]     Physician recommends and patient chooses bed at      Patient to be transferred to [facility] on 07/14/17.  Patient to be transferred to facility by [facility]     Patient family notified on 07/14/17 of transfer.  Name of family member notified:  Army ChacoJanie Darr, niece     PHYSICIAN Please sign FL2, Please prepare  prescriptions     Additional Comment:    _______________________________________________ Abigail ButtsSusan Yolandra Habig, LCSW 07/14/2017, 2:18 PM

## 2017-07-14 NOTE — Progress Notes (Signed)
Physical Therapy Note   Discussed pt with Dr. Randol KernElgergawy; She is now agreeable to SNF for rehab to maximize independence and safety with mobility prior to dc home;   PT is in agreement with this plan;   Will update dc recommendations, and plan to see her later today;     07/14/17 1000   PT - Assessment/Plan  PT Plan Discharge plan needs to be updated  Follow Up Recommendations SNF   Van ClinesHolly Otha Farley, PT  Acute Rehabilitation Services Pager [phone] Office [phone]

## 2017-07-14 NOTE — Progress Notes (Signed)
ANTICOAGULATION CONSULT NOTE - Follow Up Consult  Pharmacy Consult for Coumadin Indication: atrial fibrillation  Allergies  Allergen Reactions  . Penicillins Hives and Swelling    Has patient had a PCN reaction causing immediate rash, facial/tongue/throat swelling, SOB or lightheadedness with hypotension: Yes Has patient had a PCN reaction causing severe rash involving mucus membranes or skin necrosis: Yes Has patient had a PCN reaction that required hospitalization No Has patient had a PCN reaction occurring within the last 10 years: No If all of the above answers are "NO", then may proceed with Cephalosporin use.    . Sulfa Antibiotics Anaphylaxis and Swelling  . Fish Allergy Nausea And Vomiting    Patient Measurements: Height: 5\' 4"  (162.6 cm) Weight: 133 lb 11.2 oz (60.6 kg) IBW/kg (Calculated) : 54.7 Heparin Dosing Weight:    Vital Signs: Temp: 97.8 F (36.6 C) (04/22 0801) Temp Source: Oral (04/22 0801) BP: 155/90 (04/22 0801) Pulse Rate: 106 (04/22 0801)  Labs: Recent Labs    07/12/17 0401 07/13/17 1001 07/14/17 0649  LABPROT 42.6* 37.9* 29.8*  INR 4.52* 3.90 2.86  CREATININE 1.57* 1.78* 1.54*    Estimated Creatinine Clearance: 18.9 mL/min (A) (by C-G formula based on SCr of 1.54 mg/dL (H)).  Assessment:  Anticoag: warfarin for afib. INR 2.86 back in range. PTA dose: 2.5 mg daily  Goal of Therapy:  INR 2-3 Monitor platelets by anticoagulation protocol: Yes   Plan:  Resume Coumadin 2.5mg  daily Daily INR  Davisha Linthicum S. Merilynn Finlandobertson, PharmD, BCPS Clinical Staff Pharmacist Pager [phone]  Misty Stanleyobertson, [place] Stillinger 07/14/2017,10:58 AM

## 2017-07-14 NOTE — Discharge Summary (Signed)
Tonya Farley, is a 84 y.o. female  DOB [birth year]  MRN [id number].  Admission date:  07/09/2017  Admitting Physician  Patrecia Pour, MD  Discharge Date:  07/14/2017   Primary MD  Lorene Dy, MD  Recommendations for primary care physician for things to follow:  - check  CBC, BMP in 3 days, continue to monitor INR and adjust warfarin dose as needed, patient will need daily weight, and when necessary metolazone for volume overload   Admission Diagnosis  Acute on chronic systolic heart failure (HCC) [I50.23] Acute on chronic combined systolic and diastolic congestive heart failure (HCC) [I50.43] Atrial fibrillation with RVR ([place]) [I48.91]   Discharge Diagnosis  Acute on chronic systolic heart failure (HCC) [I50.23] Acute on chronic combined systolic and diastolic congestive heart failure (HCC) [I50.43] Atrial fibrillation with RVR ([place]) [I48.91]    Principal Problem:   Acute respiratory failure with hypoxia (HCC) Active Problems:   Acute on chronic systolic heart failure (HCC)   Atrial fibrillation with RVR (HCC)   Hypothyroidism, adult   Pulmonary HTN (HCC)   CKD (chronic kidney disease) stage 4, GFR 15-29 ml/min (HCC)   Leukocytosis   High blood pressure      Past Medical History:  Diagnosis Date  . Chronic a-fib ([place])   . Chronic back pain   . Chronic diastolic (congestive) heart failure (HCC)    a. Echo 3/17: mild LVH, EF 55%, no RWMA, mod AS (mean 10 mmHg, peak 21 mmHg), MAC, mild MR, severe LAE, mod reduced RVSF, mild RAE, mod TR, PASP 65 mmHg  . CKD (chronic kidney disease) stage 4, GFR 15-29 ml/min (HCC) 09/11/2015  . Hyperlipidemia   . Hypertension   . Hypothyroidism   . Pulmonary hypertension (HCC)    PASP 65 mmHg at Echo 3/17  . Thyroid disease     Past Surgical History:  Procedure Laterality Date  . APPENDECTOMY    . cataracts     bilateral  . EYE SURGERY     left  eye "hole"  . TONSILLECTOMY         History of present illness and  Hospital Course:     Kindly see H&P for history of present illness and admission details, please review complete Labs, Consult reports and Test reports for all details in brief  HPI  from the history and physical done on the day of admission    Hospital Course  Tonya Farley is a 84 year old female with a past medical history of systolic and diastolic heart failure, severe pulmonary hypertension, chronic atrial fibrillation on Coumadin, CKD 3, hyperlipidemia, hypothyroidism, hypertension who presented to the hospital with A. fib with RVR with heart rate 180-200 stating that had shortness of breath and she felt like she was going to die.  Oxygen level was 88% on room air.  EMS gave her 15 mg of IV Lopressor which brought her heart rate down to 110s 120s.  Of note she was seen in urgent care for wheezing on 4/13 and was given  albuterol inhaler which she was using every 4-6 hours. Cardiology was consulted in the ED. [facility].  Acute respiratory failure with hypoxia  - multifactorial secondary to acute systolic/diastolic CHF in the setting of uncontrolled A. Fib, with baseline pulmonary hypertension. - resolved, currently on room air -2D echo shows an EF of 30-35%, severe pulmonary hypertension and moderate decrease in RV function    Acute on chronic systolic heart failure  -cardiology input is appreciated, initially on IV Lasix, currently volume status significantly improved, continue with home dose Lasix 80 mg oral daily, monitor daily weights closely, give when necessary metolazone as needed. - Continue with beta blockers, Imdur, not a candidate ACE/ARB given renal failur   Atrial fibrillation with RVR  -issues requiring Cardizem drip, currently heart rate controlled on oral metoprolol and Cardizem CD - On warfarin for anticoagulation, pharmacy to dose   Right knee  gout -status post arthrocentesis, received intra-articular steroid injection by orthopedic , patient reports significant improvement of her pain after receiving injection  Elevated INR -INR is 2.8 on discharge, resume on warfarin 2.5 mg oral daily, pharmacy to monitor INR and adjust dose closely    Hypothyroidism, adult -Continue Synthroid   CKD (chronic kidney disease) stage 3-4, GFR 15-29 ml/min  -follow with diuresis, creatinine went to 1.78,      Code Status: DNR     Discharge Condition:  stable   Follow UP  Contact information for after-discharge care    Destination    [facility] .   Service:  Skilled Nursing Contact information: [place] [phone]                Discharge Instructions  and  Discharge Medications     Discharge Instructions    Discharge instructions   Complete by:  As directed    Follow with Primary MD Lorene Dy, MD Or SNF physician in 3 days from discharge  Get CBC, CMP, 2 view Chest X ray checked  by Primary MD next visit.    Activity: As tolerated with Full fall precautions use walker/cane & assistance as needed   Disposition SNF   Diet: Heart Healthy , low salt with fluid restriction 1500 mL, with feeding assistance and aspiration precautions.  For Heart failure patients - Check your Weight same time everyday, if you gain over 2 pounds, or you develop in leg swelling, experience more shortness of breath or chest pain, call your Primary MD immediately. Follow Cardiac Low Salt Diet and 1.5 lit/day fluid restriction.   On your next visit with your primary care physician please Get Medicines reviewed and adjusted.   Please request your Prim.MD to go over all Hospital Tests and Procedure/Radiological results at the follow up, please get all Hospital records sent to your Prim MD by signing hospital release before you go home.   If you  experience worsening of your admission symptoms, develop shortness of breath, life threatening emergency, suicidal or homicidal thoughts you must seek medical attention immediately by calling 911 or calling your MD immediately  if symptoms less severe.  You Must read complete instructions/literature along with all the possible adverse reactions/side effects for all the Medicines you take and that have been prescribed to you. Take any new Medicines after you have completely understood and accpet all the possible adverse reactions/side effects.   Do not drive, operating heavy machinery, perform activities at heights, swimming or participation in water  activities or provide baby sitting services if your were admitted for syncope or siezures until you have seen by Primary MD or a Neurologist and advised to do so again.  Do not drive when taking Pain medications.    Do not take more than prescribed Pain, Sleep and Anxiety Medications  Special Instructions: If you have smoked or chewed Tobacco  in the last 2 yrs please stop smoking, stop any regular Alcohol  and or any Recreational drug use.  Wear Seat belts while driving.   Please note  You were cared for by a hospitalist during your hospital stay. If you have any questions about your discharge medications or the care you received while you were in the hospital after you are discharged, you can call the unit and asked to speak with the hospitalist on call if the hospitalist that took care of you is not available. Once you are discharged, your primary care physician will handle any further medical issues. Please note that NO REFILLS for any discharge medications will be authorized once you are discharged, as it is imperative that you return to your primary care physician (or establish a relationship with a primary care physician if you do not have one) for your aftercare needs so that they can reassess your need for medications and monitor your lab  values.   Increase activity slowly   Complete by:  As directed      Allergies as of 07/14/2017      Reactions   Penicillins Hives, Swelling   Has patient had a PCN reaction causing immediate rash, facial/tongue/throat swelling, SOB or lightheadedness with hypotension: Yes Has patient had a PCN reaction causing severe rash involving mucus membranes or skin necrosis: Yes Has patient had a PCN reaction that required hospitalization No Has patient had a PCN reaction occurring within the last 10 years: No If all of the above answers are "NO", then may proceed with Cephalosporin use.   Sulfa Antibiotics Anaphylaxis, Swelling   Fish Allergy Nausea And Vomiting      Medication List    STOP taking these medications   albuterol (2.5 MG/3ML) 0.083% nebulizer solution Commonly known as:  PROVENTIL   labetalol 200 MG tablet Commonly known as:  NORMODYNE     TAKE these medications   atorvastatin 10 MG tablet Commonly known as:  LIPITOR Take 10 mg by mouth daily.   B COMPLEX 1 PO Take 1 tablet by mouth daily.   brimonidine 0.15 % ophthalmic solution Commonly known as:  ALPHAGAN Place 1 drop into both eyes 3 (three) times daily.   cholecalciferol 400 units Tabs tablet Commonly known as:  VITAMIN D Take 400 Units by mouth daily.   diltiazem 120 MG 24 hr capsule Commonly known as:  CARDIZEM CD Take 1 capsule (120 mg total) by mouth daily. Start taking on:  07/15/2017 What changed:    medication strength  how much to take   feeding supplement Liqd Take 1 Container by mouth 2 (two) times daily between meals.   furosemide 80 MG tablet Commonly known as:  LASIX TAKE 1 TABLET BY MOUTH EVERY DAY   isosorbide mononitrate 30 MG 24 hr tablet Commonly known as:  IMDUR Take 1 tablet (30 mg total) by mouth daily.   latanoprost 0.005 % ophthalmic solution Commonly known as:  XALATAN Place 1 drop into both eyes at bedtime.   levalbuterol 0.63 MG/3ML nebulizer solution Commonly  known as:  XOPENEX Take 3 mLs (0.63 mg total) by nebulization every  6 (six) hours as needed for wheezing or shortness of breath.   levothyroxine 88 MCG tablet Commonly known as:  SYNTHROID, LEVOTHROID Take 88 mcg by mouth daily before breakfast.   metolazone 2.5 MG tablet Commonly known as:  ZAROXOLYN Take 1 tablet (2.5 mg total) by mouth as needed (take if weight goes up 3 lbs in 1 day or 5 lbs in 1 week). What changed:  reasons to take this   metoprolol tartrate 100 MG tablet Commonly known as:  LOPRESSOR Take 1 tablet (100 mg total) by mouth 2 (two) times daily.   ondansetron 4 MG disintegrating tablet Commonly known as:  ZOFRAN-ODT Take 1 tablet (4 mg total) by mouth every 8 (eight) hours as needed for nausea or vomiting.   pentoxifylline 400 MG CR tablet Commonly known as:  TRENTAL Take 400 mg by mouth 3 (three) times daily with meals.   potassium chloride SA 20 MEQ tablet Commonly known as:  K-DUR,KLOR-CON Take 0.5 tablets (10 mEq total) by mouth daily. What changed:  how much to take   SYSTANE OP Apply 2 drops to eye daily as needed (dry eyes).   timolol 0.5 % ophthalmic solution Commonly known as:  TIMOPTIC Place 1 drop into both eyes every morning.   warfarin 2.5 MG tablet Commonly known as:  COUMADIN Take as directed. If you are unsure how to take this medication, talk to your nurse or doctor. Original instructions:  Take 1 tablet (2.5 mg total) by mouth daily at 6 PM. What changed:    medication strength  See the new instructions.         Diet and Activity recommendation: See Discharge Instructions above   Consults obtained -  Cardiology   Major procedures and Radiology Reports - PLEASE review detailed and final reports for all details, in brief -     Dg Chest 2 View  Result Date: 07/10/2017 CLINICAL DATA:  Acute on chronic systolic heart failure EXAM: CHEST - 2 VIEW COMPARISON:  07/09/2017 FINDINGS: Enlargement of cardiac silhouette with  pulmonary vascular congestion. Atherosclerotic calcification aorta. Persistent interstitial infiltrates consistent with pulmonary edema. Bibasilar effusions and atelectasis. No pneumothorax. Bones demineralized. IMPRESSION: CHF with persistent small bibasilar pleural effusions and atelectasis. Electronically Signed   By: Lavonia Dana M.D.   On: 07/10/2017 09:47   Ct Head Wo Contrast  Result Date: 07/10/2017 CLINICAL DATA:  Altered level of consciousness. Hypoxia. History of hypertension, hyperlipidemia, atrial fibrillation. EXAM: CT HEAD WITHOUT CONTRAST TECHNIQUE: Contiguous axial images were obtained from the base of the skull through the vertex without intravenous contrast. COMPARISON:  CT HEAD December 22, 2016 FINDINGS: BRAIN: No intraparenchymal hemorrhage, mass effect nor midline shift. The ventricles and sulci are normal for age. Minimal supratentorial white matter hypodensities less than expected for patient's age, though non-specific are most compatible with chronic small vessel ischemic disease. No acute large vascular territory infarcts. No abnormal extra-axial fluid collections. Basal cisterns are patent. VASCULAR: Moderate calcific atherosclerosis of the carotid siphons. SKULL: No skull fracture. Osteopenia. No significant scalp soft tissue swelling. SINUSES/ORBITS: Trace chronic mastoid effusions. Paranasal sinuses are well aerated.The included ocular globes and orbital contents are non-suspicious. Status post bilateral ocular lens implants. OTHER: Patient is edentulous. IMPRESSION: Negative noncontrast CT HEAD for age. Electronically Signed   By: Elon Alas M.D.   On: 07/10/2017 22:11   Dg Chest Portable 1 View  Result Date: 07/09/2017 CLINICAL DATA:  Short of breath today, history of atrial fibrillation EXAM: PORTABLE CHEST 1 VIEW COMPARISON:  Chest x-ray of 12/22/2016 FINDINGS: The lungs are not well aerated. There is basilar volume loss with bilateral pleural effusions,  cardiomegaly, and pulmonary vascular congestion, consistent with CHF. No pneumonia is seen. The bones appear somewhat osteopenic. IMPRESSION: 1. Poor inspiration with probable mild CHF with cardiomegaly and small pleural effusions. 2. No definite pneumonia is seen. Electronically Signed   By: Ivar Drape M.D.   On: 07/09/2017 12:07   Dg Knee Complete 4 Views Right  Result Date: 07/11/2017 CLINICAL DATA:  Right knee pain and swelling Status post right knee aspiration EXAM: RIGHT KNEE - COMPLETE 4+ VIEW COMPARISON:  None. FINDINGS: Vascular calcifications. Mild 3 compartment osteoarthritis, with subchondral sclerosis and osteophyte formation. Chondrocalcinosis within the lateral and medial menisci. Small suprapatellar joint effusion. IMPRESSION: Degenerative change, without acute osseous finding. Chondrocalcinosis in the menisci, consistent with calcium pyrophosphate deposition disease. Small suprapatellar joint effusion. Electronically Signed   By: Abigail Miyamoto M.D.   On: 07/11/2017 20:01    Micro Results    Recent Results (from the past 240 hour(s))  Culture, Urine     Status: Abnormal   Collection Time: 07/09/17  5:36 PM  Result Value Ref Range Status   Specimen Description URINE, CATHETERIZED  Final   Special Requests   Final    NONE Performed at [facility], [address] [place], [place] [address]    Culture 50,000 COLONIES/mL ESCHERICHIA COLI (A)  Final   Report Status 07/11/2017 FINAL  Final   Organism ID, Bacteria ESCHERICHIA COLI (A)  Final      Susceptibility   Escherichia coli - MIC*    AMPICILLIN 4 SENSITIVE Sensitive     CEFAZOLIN <=4 SENSITIVE Sensitive     CEFTRIAXONE <=1 SENSITIVE Sensitive     CIPROFLOXACIN >=4 RESISTANT Resistant     GENTAMICIN <=1 SENSITIVE Sensitive     IMIPENEM <=0.25 SENSITIVE Sensitive     NITROFURANTOIN <=16 SENSITIVE Sensitive     TRIMETH/SULFA <=20 SENSITIVE Sensitive     AMPICILLIN/SULBACTAM <=2 SENSITIVE Sensitive     PIP/TAZO <=4  SENSITIVE Sensitive     Extended ESBL NEGATIVE Sensitive     * 50,000 COLONIES/mL ESCHERICHIA COLI  Respiratory Panel by PCR     Status: None   Collection Time: 07/09/17  5:36 PM  Result Value Ref Range Status   Adenovirus NOT DETECTED NOT DETECTED Final   Coronavirus 229E NOT DETECTED NOT DETECTED Final   Coronavirus HKU1 NOT DETECTED NOT DETECTED Final   Coronavirus NL63 NOT DETECTED NOT DETECTED Final   Coronavirus OC43 NOT DETECTED NOT DETECTED Final   Metapneumovirus NOT DETECTED NOT DETECTED Final   Rhinovirus / Enterovirus NOT DETECTED NOT DETECTED Final   Influenza A NOT DETECTED NOT DETECTED Final   Influenza B NOT DETECTED NOT DETECTED Final   Parainfluenza Virus 1 NOT DETECTED NOT DETECTED Final   Parainfluenza Virus 2 NOT DETECTED NOT DETECTED Final   Parainfluenza Virus 3 NOT DETECTED NOT DETECTED Final   Parainfluenza Virus 4 NOT DETECTED NOT DETECTED Final   Respiratory Syncytial Virus NOT DETECTED NOT DETECTED Final   Bordetella pertussis NOT DETECTED NOT DETECTED Final   Chlamydophila pneumoniae NOT DETECTED NOT DETECTED Final   Mycoplasma pneumoniae NOT DETECTED NOT DETECTED Final    Comment: Performed at [facility], [place]. [address] [place], [place] [address]  MRSA PCR Screening     Status: None   Collection Time: 07/10/17  3:13 AM  Result Value Ref Range Status   MRSA  by PCR NEGATIVE NEGATIVE Final    Comment:        The GeneXpert MRSA Assay (FDA approved for NASAL specimens only), is one component of a comprehensive MRSA colonization surveillance program. It is not intended to diagnose MRSA infection nor to guide or monitor treatment for MRSA infections. Performed at [facility], [place] [address] [place], [place] [address]   Body fluid culture     Status: None (Preliminary result)   Collection Time: 07/11/17  7:03 PM  Result Value Ref Range Status   Specimen Description SYNOVIAL FLUID  Final   Special Requests NONE  Final   Gram Stain    Final    ABUNDANT WBC PRESENT,BOTH PMN AND MONONUCLEAR NO ORGANISMS SEEN    Culture   Final    NO GROWTH 3 DAYS Performed at [facility], [address], [place], [place] [address]    Report Status PENDING  Incomplete       Today   Subjective:   Tonya Farley today has no headache,no chest r abdominal pain,no new weakness tingling or numbness, reports right knee pain has resolved.  Objective:   Blood pressure (!) 155/78, pulse (!) 30, temperature 97.8 F (36.6 C), temperature source Oral, resp. rate 16, height 5' 4"  (1.626 m), weight 60.6 kg (133 lb 11.2 oz), SpO2 95 %.   Intake/Output Summary (Last 24 hours) at 07/14/2017 1303 Last data filed at 07/14/2017 0900 Gross per 24 hour  Intake 360 ml  Output 500 ml  Net -140 ml    Exam Elderly female laying in bed in no apparent distress, awake alert 3 Good air entry bilaterally, clear to auscultation, no wheezing rales or rhonchi  S1 & S2 heard, irregular irregular Abdomen soft, non-tender, nondistended. Normal bowel sound. No organomegaly Right knee swelling significantly subsided, nontender to palpation, no edema  No rashes or ulcers  Mood & affect appropriate.     Data Review   CBC w Diff:  Lab Results  Component Value Date   WBC 16.6 (H) 07/10/2017   HGB 12.5 07/10/2017   HCT 40.4 07/10/2017   PLT 236 07/10/2017   LYMPHOPCT 6 12/22/2016   MONOPCT 10 12/22/2016   EOSPCT 1 12/22/2016   BASOPCT 0 12/22/2016    CMP:  Lab Results  Component Value Date   NA 139 07/14/2017   K 4.5 07/14/2017   CL 103 07/14/2017   CO2 27 07/14/2017   BUN 42 (H) 07/14/2017   CREATININE 1.54 (H) 07/14/2017   CREATININE 1.74 (H) 11/28/2015   PROT 6.3 (L) 07/10/2017   ALBUMIN 3.4 (L) 07/10/2017   BILITOT 1.5 (H) 07/10/2017   ALKPHOS 73 07/10/2017   AST 24 07/10/2017   ALT 17 07/10/2017  .   Total Time in preparing paper work, data evaluation and todays exam - 52 minutes  Phillips Climes M.D on 07/14/2017 at  1:03 PM  [facility]   Office  [phone]

## 2017-07-15 LAB — BODY FLUID CULTURE: Culture: NO GROWTH

## 2017-08-08 ENCOUNTER — Ambulatory Visit (INDEPENDENT_AMBULATORY_CARE_PROVIDER_SITE_OTHER): Payer: Medicare Other | Admitting: *Deleted

## 2017-08-08 DIAGNOSIS — I4891 Unspecified atrial fibrillation: Secondary | ICD-10-CM

## 2017-08-08 DIAGNOSIS — Z5181 Encounter for therapeutic drug level monitoring: Secondary | ICD-10-CM

## 2017-08-08 LAB — POCT INR: INR: 1.5

## 2017-08-08 NOTE — Patient Instructions (Addendum)
Description   Today and tomorrow take 3 pills then continue taking 2.5 pills everyday.  Recheck in 1 week.  Pt Is doing 1 bottle of Equate daily and will continue eating 2-3 serving of leafy green vegetable weekly.  Call with any problems or if ordered any new medications. [facility] [phone]

## 2017-08-14 ENCOUNTER — Ambulatory Visit (INDEPENDENT_AMBULATORY_CARE_PROVIDER_SITE_OTHER): Payer: Medicare Other | Admitting: *Deleted

## 2017-08-14 DIAGNOSIS — Z5181 Encounter for therapeutic drug level monitoring: Secondary | ICD-10-CM

## 2017-08-14 DIAGNOSIS — I482 Chronic atrial fibrillation, unspecified: Secondary | ICD-10-CM

## 2017-08-14 DIAGNOSIS — I4891 Unspecified atrial fibrillation: Secondary | ICD-10-CM

## 2017-08-14 LAB — POCT INR: INR: 2.6 (ref 2.0–3.0)

## 2017-08-14 NOTE — Patient Instructions (Signed)
Description   Continue taking 2.5 pills everyday.  Recheck in 3 weeks  Pt Is doing 1 bottle of Equate daily and will continue eating 2-3 serving of leafy green vegetable weekly.  Call with any problems or if ordered any new medications. [facility] [phone]

## 2017-08-26 ENCOUNTER — Encounter: Payer: Self-pay | Admitting: Interventional Cardiology

## 2017-08-26 ENCOUNTER — Other Ambulatory Visit: Payer: Self-pay | Admitting: Interventional Cardiology

## 2017-08-26 MED ORDER — DILTIAZEM HCL ER COATED BEADS 120 MG PO CP24: 120.0000 mg | ORAL_CAPSULE | Freq: Every day | ORAL | 1 refills | Status: AC

## 2017-08-26 NOTE — Telephone Encounter (Signed)
Pt's medication was sent to pt's pharmacy as requested. Confirmation received.  °

## 2017-08-30 ENCOUNTER — Other Ambulatory Visit: Payer: Self-pay | Admitting: Interventional Cardiology

## 2017-09-03 NOTE — Progress Notes (Signed)
Cardiology Office Note   Date:  09/05/2017   ID:  Tonya Farley, DOB 06/06/1919, MRN [id number]  PCP:  Lorene Dy, MD    No chief complaint on file.  AFib  Wt Readings from Last 3 Encounters:  09/05/17 135 lb (61.2 kg)  07/14/17 133 lb 11.2 oz (60.6 kg)  02/27/17 139 lb 6.4 oz (63.2 kg)       History of Present Illness: Tonya Farley is a 84 y.o. female  has a history ofchronic AFib, diastolic HF, CKD III, HTN, hypothyroidism, pulmonary HTN. She was admitted last year back inMarch then again in May with dyspnea in the setting of PNA and AFib RVR with pleural effusions that required thoracentesis. Echo back in 3/17 showed some RV failure and PASP 72mHg. Given her pulmonary HTN she was started on Revatio 266mTID, but had to stop this medication 2/2 to cost and side effects.   In 2/18, she reported fatigue and dyspnea on exertion, but overallstable. Lasix dose at that time was 8033mn am and 55m26m pm. EKG showed Afib with rate controlled. [place]AC[place] onset of weakness in the setting of progressive swelling in her legs.Remainsin persistent AF - rate was ok. BNP 851. Echo with new finding of systolic HF with EF of 35% 10%h diffuse hypokinesis, moderate/sever TR and pulmonary hypertension.Seen by cardiology - CCB was stoppedin light of her low EF.But noted concern that HR may be affecting her EF. Labetalol was increased to TID.Admission EKG with HR of 95 noted.No other testing felt to be needed.  She has done well since that time.  She saw LoriTera Helperd  Diltiazem 180 was added in 11/18.  BP at home has been controlled per her report.  No low readings.    Since last visit, she has been doing well.  Her weight has been stable.  She denies any orthopnea or other shortness of breath.  She has not had to use the as needed metolazone.  Denies : Chest pain. Dizziness. Leg edema. Nitroglycerin use. Orthopnea.  Palpitations. Paroxysmal nocturnal dyspnea. Shortness of breath. Syncope.   We went over her medicines so that it is clear who is prescribing what after her discharge from the nursing home.    Past Medical History:  Diagnosis Date  . Chronic a-fib (HCC)[place]. Chronic back pain   . Chronic diastolic (congestive) heart failure (HCC)    a. Echo 3/17: mild LVH, EF 55%, no RWMA, mod AS (mean 10 mmHg, peak 21 mmHg), MAC, mild MR, severe LAE, mod reduced RVSF, mild RAE, mod TR, PASP 65 mmHg  . CKD (chronic kidney disease) stage 4, GFR 15-29 ml/min (HCC) 09/11/2015  . Hyperlipidemia   . Hypertension   . Hypothyroidism   . Pulmonary hypertension (HCC)    PASP 65 mmHg at Echo 3/17  . Thyroid disease     Past Surgical History:  Procedure Laterality Date  . APPENDECTOMY    . cataracts     bilateral  . EYE SURGERY     left eye "hole"  . TONSILLECTOMY       Current Outpatient Medications  Medication Sig Dispense Refill  . allopurinol (ZYLOPRIM) 100 MG tablet Take 100 mg by mouth daily.    . atMarland Kitchenrvastatin (LIPITOR) 10 MG tablet Take 10 mg by mouth daily.    . B Complex Vitamins (B COMPLEX 1 PO) Take 1 tablet by mouth daily.    .Marland Kitchen  brimonidine (ALPHAGAN) 0.15 % ophthalmic solution Place 1 drop into both eyes 3 (three) times daily.    . cholecalciferol (VITAMIN D) 400 units TABS tablet Take 400 Units by mouth daily.    Marland Kitchen diltiazem (CARDIZEM CD) 120 MG 24 hr capsule Take 1 capsule (120 mg total) by mouth daily. 90 capsule 1  . feeding supplement (BOOST / RESOURCE BREEZE) LIQD Take 1 Container by mouth 2 (two) times daily between meals. 60 Container 0  . furosemide (LASIX) 80 MG tablet TAKE 1 TABLET BY MOUTH EVERY DAY 90 tablet 2  . isosorbide mononitrate (IMDUR) 30 MG 24 hr tablet Take 1 tablet (30 mg total) by mouth daily. 90 tablet 1  . latanoprost (XALATAN) 0.005 % ophthalmic solution Place 1 drop into both eyes at bedtime.  3  . levalbuterol (XOPENEX) 0.63 MG/3ML nebulizer solution Take 3  mLs (0.63 mg total) by nebulization every 6 (six) hours as needed for wheezing or shortness of breath. 3 mL 2  . levothyroxine (SYNTHROID, LEVOTHROID) 88 MCG tablet Take 88 mcg by mouth daily before breakfast.    . metolazone (ZAROXOLYN) 2.5 MG tablet Take 1 tablet (2.5 mg total) by mouth as needed (take if weight goes up 3 lbs in 1 day or 5 lbs in 1 week). (Patient taking differently: Take 2.5 mg by mouth as needed (take if weight goes up 3 lbs in 1 day or 5 lbs in 1 week or fluid). ) 20 tablet 1  . metoprolol tartrate (LOPRESSOR) 100 MG tablet Take 1 tablet (100 mg total) by mouth 2 (two) times daily.    . ondansetron (ZOFRAN-ODT) 4 MG disintegrating tablet Take 1 tablet (4 mg total) by mouth every 8 (eight) hours as needed for nausea or vomiting. 8 tablet 0  . pentoxifylline (TRENTAL) 400 MG CR tablet Take 400 mg by mouth 3 (three) times daily with meals.    Vladimir Faster Glycol-Propyl Glycol (SYSTANE OP) Apply 2 drops to eye daily as needed (dry eyes).     . potassium chloride SA (K-DUR,KLOR-CON) 20 MEQ tablet Take 0.5 tablets (10 mEq total) by mouth daily.    . timolol (TIMOPTIC) 0.5 % ophthalmic solution Place 1 drop into both eyes every morning.   3  . warfarin (COUMADIN) 2.5 MG tablet Take 1 tablet (2.5 mg total) by mouth daily at 6 PM.     No current facility-administered medications for this visit.     Allergies:   Penicillins; Sulfa antibiotics; and Fish allergy    Social History:  The patient  reports that she has never smoked. She has never used smokeless tobacco. She reports that she does not drink alcohol or use drugs.   Family History:  The patient's family history includes Cancer in her mother; Heart attack in her father; Heart disease in her sister and sister; Other in her father.    ROS:  Please see the history of present illness.   Otherwise, review of systems are positive for rare ankle edema.   All other systems are reviewed and negative.    PHYSICAL EXAM: VS:  BP 120/62    Pulse 82   Ht _0  (1.626 m)   Wt 135 lb (61.2 kg)   SpO2 98%   BMI 23.17 kg/m  , BMI Body mass index is 23.17 kg/m. GEN: Well nourished, well developed, in no acute distress  HEENT: normal  Neck: no JVD, carotid bruits, or masses Cardiac: irregularly irregular; no murmurs, rubs, or gallops,no edema  Respiratory:  clear  to auscultation bilaterally, normal work of breathing GI: soft, nontender, nondistended, + BS MS: no deformity or atrophy  Skin: warm and dry, no rash Neuro:  Strength and sensation are intact Psych: euthymic mood, full affect   EKG:   The ekg ordered today demonstrates AFib, controlled ventricular response   Recent Labs: 07/09/2017: B Natriuretic Peptide 897.3; Magnesium 2.1; TSH 3.384 07/10/2017: ALT 17; Hemoglobin 12.5; Platelets 236 07/14/2017: BUN 42; Creatinine, Ser 1.54; Potassium 4.5; Sodium 139   Lipid Panel    Component Value Date/Time   CHOL 155 11/28/2015 1535   TRIG 122 11/28/2015 1535   HDL 58 11/28/2015 1535   CHOLHDL 2.7 11/28/2015 1535   VLDL 24 11/28/2015 1535   LDLCALC 73 11/28/2015 1535     Other studies Reviewed: Additional studies/ records that were reviewed today with results demonstrating: Cr 1.5. LDL 73.   ASSESSMENT AND PLAN:  1. Chronic systolic heart failure: She appears euvolemic.  Continue current medications. 2. AFib: Rate controlled.  Continue current medications. 3. Anticoagulated: Coumadin checked today and was therapeutic.  No bleeding issues.  Continue Coumadin for stroke prevention. 4. Hypertensive heart disease: The current medical regimen is effective;  continue present plan and medications. 5. Upper lipidemia: Continue atorvastatin.  Cholesterol has not been checked but given her age, not necessary to follow so closely.  Recent LFTs were within normal limits so no harm is being done by the statin. We went over her medicines so that it is clear who is prescribing what medicine, after her discharge from the  nursing home.    Current medicines are reviewed at length with the patient today.  The patient concerns regarding her medicines were addressed.  The following changes have been made:  No change  Labs/ tests ordered today include:   Orders Placed This Encounter  Procedures  . EKG 12-Lead    Recommend 150 minutes/week of aerobic exercise Low fat, low carb, high fiber diet recommended  Disposition:   FU in 6 months   Signed, Larae Grooms, MD  09/05/2017 11:47 AM    [facility] [address], [place], [place]  [address] Phone: [phone]; Fax: [phone]

## 2017-09-05 ENCOUNTER — Ambulatory Visit (INDEPENDENT_AMBULATORY_CARE_PROVIDER_SITE_OTHER): Payer: Medicare Other | Admitting: Interventional Cardiology

## 2017-09-05 ENCOUNTER — Ambulatory Visit (INDEPENDENT_AMBULATORY_CARE_PROVIDER_SITE_OTHER): Payer: Medicare Other | Admitting: Pharmacist

## 2017-09-05 ENCOUNTER — Encounter: Payer: Self-pay | Admitting: Interventional Cardiology

## 2017-09-05 VITALS — BP 120/62 | HR 82 | Ht 64.0 in | Wt 135.0 lb

## 2017-09-05 DIAGNOSIS — I482 Chronic atrial fibrillation, unspecified: Secondary | ICD-10-CM

## 2017-09-05 DIAGNOSIS — Z7901 Long term (current) use of anticoagulants: Secondary | ICD-10-CM

## 2017-09-05 DIAGNOSIS — Z5181 Encounter for therapeutic drug level monitoring: Secondary | ICD-10-CM | POA: Diagnosis not present

## 2017-09-05 DIAGNOSIS — I4891 Unspecified atrial fibrillation: Secondary | ICD-10-CM

## 2017-09-05 DIAGNOSIS — I110 Hypertensive heart disease with heart failure: Secondary | ICD-10-CM

## 2017-09-05 DIAGNOSIS — I5022 Chronic systolic (congestive) heart failure: Secondary | ICD-10-CM

## 2017-09-05 LAB — POCT INR: INR: 2.9 (ref 2.0–3.0)

## 2017-09-05 MED ORDER — ISOSORBIDE MONONITRATE ER 30 MG PO TB24: 30.0000 mg | ORAL_TABLET | Freq: Every day | ORAL | 1 refills | Status: AC

## 2017-09-05 NOTE — Patient Instructions (Signed)

## 2017-09-05 NOTE — Patient Instructions (Signed)
Description   Continue taking 2.5 tablets every day.  Recheck in 6 weeks  Pt Is doing 1 bottle of Equate daily and will continue eating 2-3 serving of leafy green vegetable weekly.  Call with any problems or if ordered any new medications. [facility] [phone]

## 2017-09-17 ENCOUNTER — Other Ambulatory Visit: Payer: Self-pay

## 2017-09-17 ENCOUNTER — Other Ambulatory Visit: Payer: Self-pay | Admitting: Pharmacist

## 2017-09-17 ENCOUNTER — Encounter (HOSPITAL_COMMUNITY): Payer: Self-pay

## 2017-09-17 ENCOUNTER — Emergency Department (HOSPITAL_COMMUNITY)
Admission: EM | Admit: 2017-09-17 | Discharge: 2017-09-17 | Disposition: A | Discharge status: Home / Self Care | Payer: Medicare Other | Attending: Emergency Medicine | Admitting: Emergency Medicine

## 2017-09-17 ENCOUNTER — Emergency Department (HOSPITAL_COMMUNITY): Payer: Medicare Other

## 2017-09-17 DIAGNOSIS — M5489 Other dorsalgia: Secondary | ICD-10-CM | POA: Diagnosis present

## 2017-09-17 DIAGNOSIS — I5032 Chronic diastolic (congestive) heart failure: Secondary | ICD-10-CM | POA: Diagnosis not present

## 2017-09-17 DIAGNOSIS — I130 Hypertensive heart and chronic kidney disease with heart failure and stage 1 through stage 4 chronic kidney disease, or unspecified chronic kidney disease: Secondary | ICD-10-CM | POA: Insufficient documentation

## 2017-09-17 DIAGNOSIS — E039 Hypothyroidism, unspecified: Secondary | ICD-10-CM | POA: Diagnosis not present

## 2017-09-17 DIAGNOSIS — S22060A Wedge compression fracture of T7-T8 vertebra, initial encounter for closed fracture: Secondary | ICD-10-CM | POA: Diagnosis not present

## 2017-09-17 DIAGNOSIS — M4850XA Collapsed vertebra, not elsewhere classified, site unspecified, initial encounter for fracture: Secondary | ICD-10-CM

## 2017-09-17 DIAGNOSIS — Y929 Unspecified place or not applicable: Secondary | ICD-10-CM | POA: Diagnosis not present

## 2017-09-17 DIAGNOSIS — Z79899 Other long term (current) drug therapy: Secondary | ICD-10-CM | POA: Insufficient documentation

## 2017-09-17 DIAGNOSIS — E785 Hyperlipidemia, unspecified: Secondary | ICD-10-CM | POA: Diagnosis not present

## 2017-09-17 DIAGNOSIS — I482 Chronic atrial fibrillation: Secondary | ICD-10-CM | POA: Insufficient documentation

## 2017-09-17 DIAGNOSIS — Y33XXXA Other specified events, undetermined intent, initial encounter: Secondary | ICD-10-CM | POA: Diagnosis not present

## 2017-09-17 DIAGNOSIS — Y998 Other external cause status: Secondary | ICD-10-CM | POA: Diagnosis not present

## 2017-09-17 DIAGNOSIS — Y939 Activity, unspecified: Secondary | ICD-10-CM | POA: Diagnosis not present

## 2017-09-17 DIAGNOSIS — N184 Chronic kidney disease, stage 4 (severe): Secondary | ICD-10-CM | POA: Insufficient documentation

## 2017-09-17 MED ORDER — TRAMADOL HCL 50 MG PO TABS: 50.0000 mg | ORAL_TABLET | Freq: Four times a day (QID) | ORAL | 0 refills | Status: DC | PRN

## 2017-09-17 MED ORDER — ACETAMINOPHEN 500 MG PO TABS: 1000.0000 mg | ORAL_TABLET | Freq: Four times a day (QID) | ORAL | 0 refills | Status: DC | PRN

## 2017-09-17 MED ORDER — TRAMADOL HCL 50 MG PO TABS
50.0000 mg | ORAL_TABLET | Freq: Once | ORAL | Status: AC
  Administered 2017-09-17: 50 mg via ORAL
  Filled 2017-09-17: qty 1

## 2017-09-17 MED ORDER — TRAMADOL HCL 50 MG PO TABS
50.0000 mg | ORAL_TABLET | Freq: Four times a day (QID) | ORAL | 0 refills | Status: DC | PRN
Start: 2017-09-17 — End: 2017-10-08

## 2017-09-17 MED ORDER — ONDANSETRON 4 MG PO TBDP: 4.0000 mg | ORAL_TABLET | ORAL | 0 refills | Status: DC | PRN

## 2017-09-17 MED ORDER — WARFARIN SODIUM 1 MG PO TABS: ORAL_TABLET | ORAL | 3 refills | Status: DC

## 2017-09-17 MED ORDER — ACETAMINOPHEN 500 MG PO TABS
1000.0000 mg | ORAL_TABLET | Freq: Once | ORAL | Status: AC
  Administered 2017-09-17: 1000 mg via ORAL
  Filled 2017-09-17: qty 2

## 2017-09-17 NOTE — Discharge Instructions (Addendum)
1.  Take extra strength Tylenol every 6 hours as needed for pain.  You may add 1-2 tramadol tablets if needed for additional pain control. 2.  [facility] [phone] at 8 AM tomorrow morning to get an appointment for evaluation of treatment for compression fracture with vertebroplasty.

## 2017-09-17 NOTE — ED Triage Notes (Signed)
Patient c/o upper back pain x 2 days.

## 2017-09-17 NOTE — ED Provider Notes (Signed)
[facility] DEPT Provider Note   CSN: [id number] Arrival date & time: 09/17/17  1230     History   Chief Complaint Chief Complaint  Patient presents with  . Back Pain    HPI Mikell ROSEZETTA BALDERSTON is a 84 y.o. female.  HPI Patient reports she woke up on Monday and had severe pain in her central thoracic back.  She reports that it was really hard for her to get out of bed.  She states that this happened before several years ago.  At that time she had spontaneous compression fractures and had severe associated pain.  She ultimately got injections that helped.  She reports that the pain is remained very localized in the center of her back and is worse with twisting or bending.  She has not tried any medications to treat the pain at home.  No abdominal pain no chest pain.  No weakness numbness or tingling to the extremities. Past Medical History:  Diagnosis Date  . Chronic a-fib ([place])   . Chronic back pain   . Chronic diastolic (congestive) heart failure (HCC)    a. Echo 3/17: mild LVH, EF 55%, no RWMA, mod AS (mean 10 mmHg, peak 21 mmHg), MAC, mild MR, severe LAE, mod reduced RVSF, mild RAE, mod TR, PASP 65 mmHg  . CKD (chronic kidney disease) stage 4, GFR 15-29 ml/min (HCC) 09/11/2015  . Hyperlipidemia   . Hypertension   . Hypothyroidism   . Pulmonary hypertension (HCC)    PASP 65 mmHg at Echo 3/17  . Thyroid disease     Patient Active Problem List   Diagnosis Date Noted  . Acute on chronic systolic heart failure ([place]) 07/09/2017  . Atrial fibrillation with RVR ([place]) 07/09/2017  . Hypothyroidism, adult 07/09/2017  . Pulmonary HTN ([place]) 07/09/2017  . Leukocytosis 07/09/2017  . Acute respiratory failure with hypoxia ([place]) 07/09/2017  . High blood pressure 07/09/2017  . Chronic systolic heart failure ([place])   . Acute on chronic right heart failure ([place]) 12/22/2016  . CKD (chronic kidney disease), stage III w/ renal artery stenosis 12/22/2016  .  Cellulitis of lower extremity 12/22/2016  . Chronic a-fib ([place]) 12/22/2016  . Hypothyroidism 12/22/2016  . Acute diastolic heart failure ([place]) 12/22/2016  . Elevated troponin 12/22/2016  . Acute hypokalemia 12/22/2016  . HTN (hypertension) 12/22/2016  . Dyslipidemia 12/22/2016  . Pulmonary hypertension, moderate to severe ([place]) 12/22/2016  . Abnormal urinalysis 12/22/2016  . Hypertensive heart disease 05/08/2016  . Chronic diastolic heart failure ([place]) 11/28/2015  . Anticoagulated 11/28/2015  . CKD (chronic kidney disease) stage 3, GFR 30-59 ml/min (HCC) 09/11/2015  . CKD (chronic kidney disease) stage 4, GFR 15-29 ml/min (HCC) 09/11/2015  . Pulmonary hypertension ([place])   . Pleural effusion on left   . Peripheral edema   . Hypokalemia   . Anasarca 08/10/2015  . Acute on chronic respiratory failure with hypoxia ([place])   . Pleural effusion   . Physical deconditioning   . Cough   . HCAP (healthcare-associated pneumonia)   . Chest congestion   . SOB (shortness of breath)   . Swelling   . Acute on chronic diastolic heart failure ([place])   . HLD (hyperlipidemia)   . Influenza due to identified novel influenza A virus with other respiratory manifestations 06/07/2015  . Pleural effusion, bacterial   . Dyspnea   . AKI (acute kidney injury) ([place])   . CAP (community acquired pneumonia) 05/30/2015  . Renal artery stenosis ([place]) 08/08/2014  .  Encounter for therapeutic drug monitoring 04/21/2013  . Hypertension 06/25/2012  . Hyperlipidemia 06/25/2012  . Atrial fibrillation ([place]) 06/25/2012  . Compression fracture of lumbar spine, non-traumatic ([place]) 06/25/2012  . Chronic back pain     Past Surgical History:  Procedure Laterality Date  . APPENDECTOMY    . cataracts     bilateral  . EYE SURGERY     left eye "hole"  . TONSILLECTOMY       OB History   None      Home Medications    Prior to Admission medications   Medication Sig Start Date End Date Taking? Authorizing  Provider  allopurinol (ZYLOPRIM) 100 MG tablet Take 150 mg by mouth daily.    Yes [provider]  atorvastatin (LIPITOR) 10 MG tablet Take 10 mg by mouth daily.   Yes [provider]  B Complex Vitamins (B COMPLEX 1 PO) Take 1 tablet by mouth daily.   Yes [provider]  brimonidine (ALPHAGAN) 0.15 % ophthalmic solution Place 1 drop into both eyes 3 (three) times daily.   Yes [provider]  cholecalciferol (VITAMIN D) 400 units TABS tablet Take 400 Units by mouth daily.   Yes [provider]  diltiazem (CARDIZEM CD) 120 MG 24 hr capsule Take 1 capsule (120 mg total) by mouth daily. 08/26/17  Yes Jettie Booze, MD  feeding supplement (BOOST / RESOURCE BREEZE) LIQD Take 1 Container by mouth 2 (two) times daily between meals. 12/27/16  Yes Ghimire, Henreitta Leber, MD  furosemide (LASIX) 80 MG tablet TAKE 1 TABLET BY MOUTH EVERY DAY 04/28/17  Yes Jettie Booze, MD  isosorbide mononitrate (IMDUR) 30 MG 24 hr tablet Take 1 tablet (30 mg total) by mouth daily. 09/05/17  Yes Jettie Booze, MD  latanoprost (XALATAN) 0.005 % ophthalmic solution Place 1 drop into both eyes at bedtime. 01/10/14  Yes [provider]  levothyroxine (SYNTHROID, LEVOTHROID) 88 MCG tablet Take 88 mcg by mouth daily before breakfast.   Yes [provider]  metoprolol tartrate (LOPRESSOR) 100 MG tablet Take 1 tablet (100 mg total) by mouth 2 (two) times daily. 07/14/17  Yes Elgergawy, Silver Huguenin, MD  Polyethyl Glycol-Propyl Glycol (SYSTANE OP) Apply 2 drops to eye daily as needed (dry eyes).    Yes [provider]  potassium chloride SA (K-DUR,KLOR-CON) 20 MEQ tablet Take 0.5 tablets (10 mEq total) by mouth daily. 07/14/17  Yes Elgergawy, Silver Huguenin, MD  timolol (TIMOPTIC) 0.5 % ophthalmic solution Place 1 drop into both eyes every morning.  12/28/13  Yes [provider]  warfarin (COUMADIN) 1 MG tablet Take 2.5 tablets daily as directed by Coumadin  clinic 09/17/17  Yes Jettie Booze, MD  acetaminophen (TYLENOL) 500 MG tablet Take 2 tablets (1,000 mg total) by mouth every 6 (six) hours as needed. 09/17/17   Charlesetta Shanks, MD  levalbuterol (XOPENEX) 0.63 MG/3ML nebulizer solution Take 3 mLs (0.63 mg total) by nebulization every 6 (six) hours as needed for wheezing or shortness of breath. Patient not taking: Reported on 09/17/2017 07/14/17 07/14/18  Elgergawy, Silver Huguenin, MD  metolazone (ZAROXOLYN) 2.5 MG tablet Take 1 tablet (2.5 mg total) by mouth as needed (take if weight goes up 3 lbs in 1 day or 5 lbs in 1 week). Patient taking differently: Take 2.5 mg by mouth as needed (take if weight goes up 3 lbs in 1 day or 5 lbs in 1 week or fluid).  10/27/15   Liliane Shi, PA-C  ondansetron (ZOFRAN ODT) 4 MG disintegrating tablet Take 1 tablet (4 mg total) by mouth every 4 (four) hours as needed for nausea or vomiting. 09/17/17   Charlesetta Shanks, MD  ondansetron (ZOFRAN-ODT) 4 MG disintegrating tablet Take 1 tablet (4 mg total) by mouth every 8 (eight) hours as needed for nausea or vomiting. Patient not taking: Reported on 09/17/2017 09/17/16   Davonna Belling, MD  traMADol (ULTRAM) 50 MG tablet Take 1 tablet (50 mg total) by mouth every 6 (six) hours as needed. 09/17/17   Charlesetta Shanks, MD    Family History Family History  Problem Relation Age of Onset  . Heart disease Sister   . Heart disease Sister   . Cancer Mother   . Other Father   . Heart attack Father     Social History Social History   Tobacco Use  . Smoking status: Never Smoker  . Smokeless tobacco: Never Used  Substance Use Topics  . Alcohol use: No  . Drug use: No     Allergies   Penicillins; Sulfa antibiotics; and Fish allergy   Review of Systems Review of Systems 10 Systems reviewed and are negative for acute change except as noted in the HPI.   Physical Exam Updated Vital Signs BP 127/60 (BP Location: Left Arm)   Pulse 65   Temp 98 F (36.7 C) (Oral)    Resp 18   Ht _0  (1.626 m)   Wt 61.2 kg (135 lb)   SpO2 97%   BMI 23.17 kg/m   Physical Exam  Constitutional: She is oriented to person, place, and time. She appears well-developed and well-nourished.  HENT:  Head: Normocephalic and atraumatic.  Eyes: EOM are normal.  Neck: Neck supple.  Cardiovascular: Normal rate, normal heart sounds and intact distal pulses.  Irregularly irregular.  Pulmonary/Chest: Effort normal and breath sounds normal.  Abdominal: Soft. Bowel sounds are normal. She exhibits no distension. There is no tenderness.  Musculoskeletal: Normal range of motion. She exhibits no edema.  Focal tenderness thoracic spine mid to lower in the midline.  No crepitus.  No step-off.  Neurological: She is alert and oriented to person, place, and time. She has normal strength. Coordination normal. GCS eye subscore is 4. GCS verbal subscore is 5. GCS motor subscore is 6.  Skin: Skin is warm, dry and intact.  Psychiatric: She has a normal mood and affect.     ED Treatments / Results  Labs (all labs ordered are listed, but only abnormal results are displayed) Labs Reviewed - No data to display  EKG None  Radiology Dg Chest 2 View  Result Date: 09/17/2017 CLINICAL DATA:  Two days of upper back pain EXAM: CHEST - 2 VIEW COMPARISON:  Chest x-ray dated July 10, 2017 and chest x-ray of December 22, 2016 FINDINGS: The lungs are adequately inflated. The interstitial markings are coarse. This is not new however. There is chronically increased density at the left lung base. There is there tiny bilateral pleural effusions layering posteriorly which have decreased in size. Cardiac silhouette is enlarged. The pulmonary vascularity is not engorged. There is calcification in the wall of the thoracic aorta. There is multilevel degenerative disc disease of the thoracic spine. Mild loss of height of T8 and T10 is suspected. This is demonstrated to better advantage on the accompanying thoracic  spine series. IMPRESSION: Chronic bronchitic changes. Small bilateral pleural effusions which have decreased in size since the previous study. No acute pneumonia nor pulmonary edema. Stable cardiomegaly. Thoracic aortic  atherosclerosis. Electronically Signed   By: David  Martinique M.D.   On: 09/17/2017 17:01   Dg Thoracic Spine 2 View  Result Date: 09/17/2017 CLINICAL DATA:  Back pain EXAM: THORACIC SPINE 2 VIEWS COMPARISON:  CT chest 06/12/2015 FINDINGS: Limited anatomical evaluation due to osteopenia and patient size. Moderate compression fracture T7 and T9. These were not present on the prior CT. Severe compression fracture L1 with cement vertebral augmentation. IMPRESSION: Moderate compression fractures T7 and T9 of indeterminate age but not present in 2017 and not definitely present on a lateral chest x-ray of 07/10/2017. Limited study Electronically Signed   By: Franchot Gallo M.D.   On: 09/17/2017 16:56   Dg Lumbar Spine 2-3 Views  Result Date: 09/17/2017 CLINICAL DATA:  Acute upper back pain. EXAM: LUMBAR SPINE - 2-3 VIEW COMPARISON:  Radiographs of June 23, 2012. FINDINGS: Status post kyphoplasty of L1 and L4 vertebral bodies. No acute fracture or spondylolisthesis is noted. Stable old L3 compression fracture is noted. Atherosclerosis of abdominal aorta is noted. IMPRESSION: Status post kyphoplasty of L1 and L4 vertebral bodies. Old L3 compression fracture is noted. No acute abnormality seen in the lumbar spine. Aortic Atherosclerosis (ICD10-I70.0). Electronically Signed   By: Marijo Conception, M.D.   On: 09/17/2017 16:55    Procedures Procedures (including critical care time)  Medications Ordered in ED Medications  acetaminophen (TYLENOL) tablet 1,000 mg (1,000 mg Oral Given 09/17/17 1521)  traMADol (ULTRAM) tablet 50 mg (50 mg Oral Given 09/17/17 1521)     Initial Impression / Assessment and Plan / ED Course  I have reviewed the triage vital signs and the nursing notes.  Pertinent labs &  imaging results that were available during my care of the patient were reviewed by me and considered in my medical decision making (see chart for details).    Patient got significant improvement with 2 extra strength Tylenol and 1 tramadol.  Consultation: Reviewed with Dr. Anselm Pancoast of interventional radiology.  He will try to facilitate the patient getting a appointment on an outpatient basis possibly tomorrow.  Advises to give the patient is a #4 greens were radiology interventional and to call first thing in the morning to schedule a time.  Final Clinical Impressions(s) / ED Diagnoses   Final diagnoses:  Non-traumatic compression fracture of vertebral column, initial encounter [facility])   Patient presents as outlined above.  Findings are consistent with nontraumatic compression fracture.  Although patient is a pain, she has not tried anything for pain at home.  A combination of acetaminophen and tramadol was very helpful.  She has no neurologic dysfunction.  Patient's niece was hopeful that she could get a vertebroplasty through the emergency department.  We had extensive discussion that this is really not a procedure that is done through the department on an elective basis.  She had the impression because she had gotten admitted to the hospital with a similar condition that it could be done.  I did consult interventional radiology who advises they will try to fast track the patient on an outpatient basis to assess her for vertebroplasty.  She is agreeable with this plan and the patient will stay with her family member overnight.  The patient is alert and interactive.  She is not showing any acute distress. ED Discharge Orders        Ordered    acetaminophen (TYLENOL) 500 MG tablet  Every 6 hours PRN     09/17/17 1718    traMADol (ULTRAM) 50 MG tablet  Every 6 hours PRN     09/17/17 1718    ondansetron (ZOFRAN ODT) 4 MG disintegrating tablet  Every 4 hours PRN     09/17/17 1718       Charlesetta Shanks, MD 09/17/17 1725

## 2017-09-18 ENCOUNTER — Encounter: Payer: Self-pay | Admitting: Radiology

## 2017-09-18 ENCOUNTER — Ambulatory Visit
Admission: RE | Admit: 2017-09-18 | Discharge: 2017-09-18 | Disposition: A | Discharge status: Home / Self Care | Payer: Medicare Other | Source: Ambulatory Visit | Attending: Interventional Radiology | Admitting: Interventional Radiology

## 2017-09-18 ENCOUNTER — Ambulatory Visit (HOSPITAL_COMMUNITY)
Admission: RE | Admit: 2017-09-18 | Discharge: 2017-09-18 | Disposition: A | Discharge status: Home / Self Care | Payer: Medicare Other | Source: Ambulatory Visit | Attending: Interventional Radiology | Admitting: Interventional Radiology

## 2017-09-18 ENCOUNTER — Other Ambulatory Visit: Payer: Self-pay | Admitting: Interventional Radiology

## 2017-09-18 ENCOUNTER — Telehealth: Payer: Self-pay | Admitting: Interventional Cardiology

## 2017-09-18 ENCOUNTER — Encounter: Payer: Self-pay | Admitting: Interventional Cardiology

## 2017-09-18 DIAGNOSIS — M5124 Other intervertebral disc displacement, thoracic region: Secondary | ICD-10-CM | POA: Insufficient documentation

## 2017-09-18 DIAGNOSIS — M4854XA Collapsed vertebra, not elsewhere classified, thoracic region, initial encounter for fracture: Secondary | ICD-10-CM | POA: Diagnosis not present

## 2017-09-18 DIAGNOSIS — M546 Pain in thoracic spine: Secondary | ICD-10-CM | POA: Insufficient documentation

## 2017-09-18 DIAGNOSIS — M4856XA Collapsed vertebra, not elsewhere classified, lumbar region, initial encounter for fracture: Secondary | ICD-10-CM | POA: Diagnosis not present

## 2017-09-18 DIAGNOSIS — J90 Pleural effusion, not elsewhere classified: Secondary | ICD-10-CM | POA: Insufficient documentation

## 2017-09-18 DIAGNOSIS — M4804 Spinal stenosis, thoracic region: Secondary | ICD-10-CM | POA: Insufficient documentation

## 2017-09-18 DIAGNOSIS — S22000A Wedge compression fracture of unspecified thoracic vertebra, initial encounter for closed fracture: Secondary | ICD-10-CM

## 2017-09-18 DIAGNOSIS — M48061 Spinal stenosis, lumbar region without neurogenic claudication: Secondary | ICD-10-CM | POA: Diagnosis not present

## 2017-09-18 HISTORY — PX: IR RADIOLOGIST EVAL & MGMT: IMG5224

## 2017-09-18 NOTE — Consult Note (Addendum)
Chief Complaint: New onset back pain.  Referring Physician(s): ED physician, Dr. Charlesetta Shanks  PCP: Dr. Lorene Dy  History of Present Illness: Tonya Farley is a 84 y.o. female presenting today to [facility] clinic, referred by the ED, for evaluation of possible candidacy for vertebral augmentation to address new onset back pain and possible thoracic compression fracture.   Tonya Farley is here today with her niece for the interview.   They tell me that she has had pain since Tuesday morning of this week, 10/10 intensity, located in the mid thoracic region, of sharp quality and unremitting.  This has affected her day to day activity, leaving her very uncomfortable, and she went to the [facility] ED on Wednesday evening for evaluation.   X-Ray imaging was performed, which show evidence of T7 and T9 compression fractures.    She tells me that she did not have a fall, and the pain started spontaneously.  She denies any associated symptoms such as weakness of her arms, legs.  She denies any urinary symptoms such as retention or incontinence.  No sensory changes.    She denies any symptoms of UTI or PNA.    She has been treated 5 years ago by my partner Dr. Barbie Banner for 2 compression fractures of the lumbar spine, L1 and L4.  She tells me she had excellent relief and was very satisfied.    She lives alone, still drives, and takes care of all of her daily affairs.  The pain has significantly affected her.    She is taking tylenol with some relief, but still has pain symptoms.  She has not taken the tramadol prescribed by the ED.   Past Medical History:  Diagnosis Date  . Chronic a-fib ([place])   . Chronic back pain   . Chronic diastolic (congestive) heart failure (HCC)    a. Echo 3/17: mild LVH, EF 55%, no RWMA, mod AS (mean 10 mmHg, peak 21 mmHg), MAC, mild MR, severe LAE, mod reduced RVSF, mild RAE, mod TR, PASP 65 mmHg  . CKD (chronic kidney disease) stage  4, GFR 15-29 ml/min (HCC) 09/11/2015  . Hyperlipidemia   . Hypertension   . Hypothyroidism   . Pulmonary hypertension (HCC)    PASP 65 mmHg at Echo 3/17  . Thyroid disease     Past Surgical History:  Procedure Laterality Date  . APPENDECTOMY    . cataracts     bilateral  . EYE SURGERY     left eye "hole"  . TONSILLECTOMY      Allergies: Penicillins; Sulfa antibiotics; and Fish allergy  Medications: Prior to Admission medications   Medication Sig Start Date End Date Taking? Authorizing Provider  acetaminophen (TYLENOL) 500 MG tablet Take 2 tablets (1,000 mg total) by mouth every 6 (six) hours as needed. 09/17/17   Davonna Belling, MD  allopurinol (ZYLOPRIM) 100 MG tablet Take 150 mg by mouth daily.     [provider]  atorvastatin (LIPITOR) 10 MG tablet Take 10 mg by mouth daily.    [provider]  B Complex Vitamins (B COMPLEX 1 PO) Take 1 tablet by mouth daily.    [provider]  brimonidine (ALPHAGAN) 0.15 % ophthalmic solution Place 1 drop into both eyes 3 (three) times daily.    [provider]  cholecalciferol (VITAMIN D) 400 units TABS tablet Take 400 Units by mouth daily.    [provider]  diltiazem (CARDIZEM CD) 120 MG 24 hr  capsule Take 1 capsule (120 mg total) by mouth daily. 08/26/17   Jettie Booze, MD  feeding supplement (BOOST / RESOURCE BREEZE) LIQD Take 1 Container by mouth 2 (two) times daily between meals. 12/27/16   Ghimire, Henreitta Leber, MD  furosemide (LASIX) 80 MG tablet TAKE 1 TABLET BY MOUTH EVERY DAY 04/28/17   Jettie Booze, MD  isosorbide mononitrate (IMDUR) 30 MG 24 hr tablet Take 1 tablet (30 mg total) by mouth daily. 09/05/17   Jettie Booze, MD  latanoprost (XALATAN) 0.005 % ophthalmic solution Place 1 drop into both eyes at bedtime. 01/10/14   [provider]  levalbuterol Penne Lash) 0.63 MG/3ML nebulizer solution Take 3 mLs (0.63 mg total) by nebulization every 6 (six) hours as  needed for wheezing or shortness of breath. Patient not taking: Reported on 09/17/2017 07/14/17 07/14/18  Elgergawy, Silver Huguenin, MD  levothyroxine (SYNTHROID, LEVOTHROID) 88 MCG tablet Take 88 mcg by mouth daily before breakfast.    [provider]  metolazone (ZAROXOLYN) 2.5 MG tablet Take 1 tablet (2.5 mg total) by mouth as needed (take if weight goes up 3 lbs in 1 day or 5 lbs in 1 week). Patient taking differently: Take 2.5 mg by mouth as needed (take if weight goes up 3 lbs in 1 day or 5 lbs in 1 week or fluid).  10/27/15   Richardson Dopp T, PA-C  metoprolol tartrate (LOPRESSOR) 100 MG tablet Take 1 tablet (100 mg total) by mouth 2 (two) times daily. 07/14/17   Elgergawy, Silver Huguenin, MD  ondansetron (ZOFRAN ODT) 4 MG disintegrating tablet Take 1 tablet (4 mg total) by mouth every 4 (four) hours as needed for nausea or vomiting. 09/17/17   Davonna Belling, MD  ondansetron (ZOFRAN-ODT) 4 MG disintegrating tablet Take 1 tablet (4 mg total) by mouth every 8 (eight) hours as needed for nausea or vomiting. Patient not taking: Reported on 09/17/2017 09/17/16   Davonna Belling, MD  Polyethyl Glycol-Propyl Glycol (SYSTANE OP) Apply 2 drops to eye daily as needed (dry eyes).     [provider]  potassium chloride SA (K-DUR,KLOR-CON) 20 MEQ tablet Take 0.5 tablets (10 mEq total) by mouth daily. 07/14/17   Elgergawy, Silver Huguenin, MD  timolol (TIMOPTIC) 0.5 % ophthalmic solution Place 1 drop into both eyes every morning.  12/28/13   [provider]  traMADol (ULTRAM) 50 MG tablet Take 1 tablet (50 mg total) by mouth every 6 (six) hours as needed. 09/17/17   Davonna Belling, MD  warfarin (COUMADIN) 1 MG tablet Take 2.5 tablets daily as directed by Coumadin clinic 09/17/17   Jettie Booze, MD     Family History  Problem Relation Age of Onset  . Heart disease Sister   . Heart disease Sister   . Cancer Mother   . Other Father   . Heart attack Father     Social History    Socioeconomic History  . Marital status: Widowed    Spouse name: Not on file  . Number of children: Not on file  . Years of education: Not on file  . Highest education level: Not on file  Occupational History  . Not on file  Social Needs  . Financial resource strain: Not on file  . Food insecurity:    Worry: Not on file    Inability: Not on file  . Transportation needs:    Medical: Not on file    Non-medical: Not on file  Tobacco Use  . Smoking status:  Never Smoker  . Smokeless tobacco: Never Used  Substance and Sexual Activity  . Alcohol use: No  . Drug use: No  . Sexual activity: Not on file  Lifestyle  . Physical activity:    Days per week: Not on file    Minutes per session: Not on file  . Stress: Not on file  Relationships  . Social connections:    Talks on phone: Not on file    Gets together: Not on file    Attends religious service: Not on file    Active member of club or organization: Not on file    Attends meetings of clubs or organizations: Not on file    Relationship status: Not on file  Other Topics Concern  . Not on file  Social History Narrative  . Not on file    Review of Systems: A 12 point ROS discussed and pertinent positives are indicated in the HPI above.  All other systems are negative.  Review of Systems  Vital Signs: BP (!) 144/71   Pulse 79   Temp 98 F (36.7 C) (Oral)   Resp 16   Ht _0  (1.626 m)   Wt 132 lb (59.9 kg)   SpO2 98%   BMI 22.66 kg/m   Physical Exam General: 84 yo female appearing younger than stated age.  Well-developed, well-nourished.  No distress. HEENT: Atraumatic, normocephalic.  Conjugate gaze, extra-ocular motor intact. Glasses. No scleral icterus or scleral injection. No lesions on external ears, nose, lips, or gums.  Oral mucosa moist, pink.  Neck: Symmetric with no goiter enlargement.  Chest/Lungs:  Symmetric chest with inspiration/expiration.  No labored breathing.  Clear to auscultation with no  wheezes, rhonchi, or rales.  Heart:  RRR, with no third heart sounds appreciated. No JVD appreciated.  Abdomen:  Soft, NT/ND, with + bowel sounds.   Genito-urinary: Deferred Neurologic: Alert & Oriented to person, place, and time.   Normal affect and insight.  Appropriate questions.  Moving all 4 extremities with gross sensory intact.  MSK:  Accentuated thoracic kyphosis.  Acutely TTP along the midline thoracis spine.  No reproducible pain of the lumbar region.  .   Imaging: Dg Chest 2 View  Result Date: 09/17/2017 CLINICAL DATA:  Two days of upper back pain EXAM: CHEST - 2 VIEW COMPARISON:  Chest x-ray dated July 10, 2017 and chest x-ray of December 22, 2016 FINDINGS: The lungs are adequately inflated. The interstitial markings are coarse. This is not new however. There is chronically increased density at the left lung base. There is there tiny bilateral pleural effusions layering posteriorly which have decreased in size. Cardiac silhouette is enlarged. The pulmonary vascularity is not engorged. There is calcification in the wall of the thoracic aorta. There is multilevel degenerative disc disease of the thoracic spine. Mild loss of height of T8 and T10 is suspected. This is demonstrated to better advantage on the accompanying thoracic spine series. IMPRESSION: Chronic bronchitic changes. Small bilateral pleural effusions which have decreased in size since the previous study. No acute pneumonia nor pulmonary edema. Stable cardiomegaly. Thoracic aortic atherosclerosis. Electronically Signed   By: David  Martinique M.D.   On: 09/17/2017 17:01   Dg Thoracic Spine 2 View  Result Date: 09/17/2017 CLINICAL DATA:  Back pain EXAM: THORACIC SPINE 2 VIEWS COMPARISON:  CT chest 06/12/2015 FINDINGS: Limited anatomical evaluation due to osteopenia and patient size. Moderate compression fracture T7 and T9. These were not present on the prior CT. Severe compression fracture L1  with cement vertebral augmentation.  IMPRESSION: Moderate compression fractures T7 and T9 of indeterminate age but not present in 2017 and not definitely present on a lateral chest x-ray of 07/10/2017. Limited study Electronically Signed   By: Franchot Gallo M.D.   On: 09/17/2017 16:56   Dg Lumbar Spine 2-3 Views  Result Date: 09/17/2017 CLINICAL DATA:  Acute upper back pain. EXAM: LUMBAR SPINE - 2-3 VIEW COMPARISON:  Radiographs of June 23, 2012. FINDINGS: Status post kyphoplasty of L1 and L4 vertebral bodies. No acute fracture or spondylolisthesis is noted. Stable old L3 compression fracture is noted. Atherosclerosis of abdominal aorta is noted. IMPRESSION: Status post kyphoplasty of L1 and L4 vertebral bodies. Old L3 compression fracture is noted. No acute abnormality seen in the lumbar spine. Aortic Atherosclerosis (ICD10-I70.0). Electronically Signed   By: Marijo Conception, M.D.   On: 09/17/2017 16:55    Labs:  CBC: Recent Labs    12/26/16 1014 07/09/17 1146 07/10/17 0337 07/10/17 2011  WBC 12.2* 13.4* 12.0* 16.6*  HGB 10.9* 10.0* 11.9* 12.5  HCT 35.2* 31.8* 37.5 40.4  PLT 250 273 217 236    COAGS: Recent Labs    07/14/17 0649 08/08/17 0935 08/14/17 1033 09/05/17 0939  INR 2.86 1.5 2.6 2.9    BMP: Recent Labs    07/11/17 0312 07/12/17 0401 07/13/17 1001 07/14/17 0649  NA 143 142 139 139  K 4.2 4.3 5.0 4.5  CL 106 102 102 103  CO2 _0 GLUCOSE 132* 115* 232* 130*  BUN 25* 29* 41* 42*  CALCIUM 9.3 9.6 9.4 9.4  CREATININE 1.49* 1.57* 1.78* 1.54*  GFRNONAA 29* 27* 23* 28*  GFRAA 33* 31* 27* 32*    LIVER FUNCTION TESTS: Recent Labs    12/22/16 1153 07/09/17 1146 07/10/17 2011  BILITOT 1.0 1.1 1.5*  AST _1 ALT _2 ALKPHOS 59 73 73  PROT 6.8 6.6 6.3*  ALBUMIN 3.3* 3.5 3.4*    TUMOR MARKERS: No results for input(s): AFPTM, CEA, CA199, CHROMGRNA in the last 8760 hours.  Assessment and Plan:  Tonya Speiser is 84 year old female with acute thoracic back pain that is  severely limiting her day to day activity, as she is very uncomfortable.    X-ray imaging shows evidence of T7 and T9 compression fracture, and she is potentially a candidate for vertebral augmentation.   I had a lengthy discussion with her regarding the goals of our procedure, with which she is already familiar, having had L1 and L4 treated with this method 5 years ago.  Our goals are for pain control so that she may comfortably return to her activities, and not remain bedbound, as well as decrease the need for high doses of pain medicines.    They understand, and are anxious to proceed with treatment. Her niece feels strongly to be aggressive with therapy and avoid a conservative/"bedrest" trial period.   I do feel that MRI is indicated, given that she is a candidate for MRI and we would want to confirm precisely how many levels need therapy.    Also, we will need to have her withdrawn from her coumadin, which she is prescribed for stroke risk reduction given her Afib.    Plan: - MRI asap for evaluation and determine targets for therapy - tentatively plan for vertebral augmentation, first available.  They prefer [facility] as location, but would rather first available site be a priority.  - We will need  to account for her coumadin therapy, and will need her INR sub-therapeutic for treatment.    Thank you for this interesting consult.  I greatly enjoyed meeting Tonya Farley and look forward to participating in their care.  A copy of this report was sent to the requesting provider on this date.  Electronically Signed: Corrie Mckusick 09/18/2017, 12:23 PM    I spent a total of  40 Minutes   in face to face in clinical consultation, greater than 50% of which was counseling/coordinating care for symptomatic thoracic compression fracture, possible vertebral augmentation of 1 or more levels

## 2017-09-18 NOTE — Telephone Encounter (Signed)
DISREGARD OPENED IN ERROR

## 2017-09-19 ENCOUNTER — Telehealth: Payer: Self-pay | Admitting: Interventional Cardiology

## 2017-09-19 ENCOUNTER — Telehealth: Payer: Self-pay | Admitting: Pharmacist

## 2017-09-19 ENCOUNTER — Other Ambulatory Visit (HOSPITAL_COMMUNITY): Payer: Self-pay | Admitting: Interventional Radiology

## 2017-09-19 DIAGNOSIS — M4850XA Collapsed vertebra, not elsewhere classified, site unspecified, initial encounter for fracture: Secondary | ICD-10-CM

## 2017-09-19 NOTE — Telephone Encounter (Signed)
Pt's daughter is aware she can hold her Coumadin for 4 days prior to procedure. I had not left a message for patient's daughter, will route to [place] in case she had left a message as an FYI that pt is aware of anticoagulation plan.

## 2017-09-19 NOTE — Telephone Encounter (Signed)
-----   Message from Corky CraftsJayadeep S Varanasi, MD sent at 09/18/2017  5:10 PM EDT ----- OK to hold COumadin for 4 days prior to kyphoplasty.  JV ----- Message ----- From: Amado NashGales, Henri, RN Sent: 09/18/2017  12:19 PM To: Estell HarpinVickie Middleton, Corky CraftsJayadeep S Varanasi, MD  Dr Eldridge DaceVaranasi,  The above patient was seen today in consult with Dr Gilmer MorJaime Wagner for possible kyphoplasty of a thoracic compression fracture. She is scheduled for MR Thoracic at 2 pm for confirmation/planning.  Per Radiology protocol, warfarin should be held for 4 days prior to kyphoplasty.  As the ordering physician, we will need your approval for the patient to hold warfarin.    If this meets with your approval, you may place a note in Epic and/or notify our office at [phone].  Thank you, Henri, RCharity fundraiser

## 2017-09-19 NOTE — Telephone Encounter (Signed)
New Message    Darel HongJudy Lopp is calling on behalf of patient. She states that she received a call today about this patient. The only thing the message said was to call [phone]. Not sure as to what the call is about. Please call to discuss.

## 2017-09-22 ENCOUNTER — Other Ambulatory Visit: Payer: Self-pay | Admitting: Radiology

## 2017-09-22 ENCOUNTER — Telehealth: Payer: Self-pay | Admitting: Interventional Cardiology

## 2017-09-22 NOTE — Telephone Encounter (Signed)
New Message   Pt's daughter is wanting to know if her mom can be referred to a family practice in our group that handles elderly patients. Please call

## 2017-09-22 NOTE — Telephone Encounter (Signed)
[facility] number provided to assist in finding new PCP.

## 2017-09-24 ENCOUNTER — Other Ambulatory Visit: Payer: Self-pay | Admitting: Student

## 2017-09-24 ENCOUNTER — Ambulatory Visit (HOSPITAL_COMMUNITY)
Admission: RE | Admit: 2017-09-24 | Discharge: 2017-09-24 | Disposition: A | Discharge status: Home / Self Care | Payer: Medicare Other | Source: Ambulatory Visit | Attending: Interventional Radiology | Admitting: Interventional Radiology

## 2017-09-24 ENCOUNTER — Other Ambulatory Visit: Payer: Self-pay | Admitting: Radiology

## 2017-09-24 DIAGNOSIS — G8929 Other chronic pain: Secondary | ICD-10-CM | POA: Diagnosis not present

## 2017-09-24 DIAGNOSIS — Z7901 Long term (current) use of anticoagulants: Secondary | ICD-10-CM | POA: Diagnosis not present

## 2017-09-24 DIAGNOSIS — I272 Pulmonary hypertension, unspecified: Secondary | ICD-10-CM | POA: Diagnosis not present

## 2017-09-24 DIAGNOSIS — E785 Hyperlipidemia, unspecified: Secondary | ICD-10-CM | POA: Diagnosis not present

## 2017-09-24 DIAGNOSIS — M4854XA Collapsed vertebra, not elsewhere classified, thoracic region, initial encounter for fracture: Secondary | ICD-10-CM | POA: Insufficient documentation

## 2017-09-24 DIAGNOSIS — Z882 Allergy status to sulfonamides status: Secondary | ICD-10-CM | POA: Diagnosis not present

## 2017-09-24 DIAGNOSIS — N184 Chronic kidney disease, stage 4 (severe): Secondary | ICD-10-CM | POA: Diagnosis not present

## 2017-09-24 DIAGNOSIS — I5032 Chronic diastolic (congestive) heart failure: Secondary | ICD-10-CM | POA: Insufficient documentation

## 2017-09-24 DIAGNOSIS — Z8249 Family history of ischemic heart disease and other diseases of the circulatory system: Secondary | ICD-10-CM | POA: Diagnosis not present

## 2017-09-24 DIAGNOSIS — I130 Hypertensive heart and chronic kidney disease with heart failure and stage 1 through stage 4 chronic kidney disease, or unspecified chronic kidney disease: Secondary | ICD-10-CM | POA: Insufficient documentation

## 2017-09-24 DIAGNOSIS — E039 Hypothyroidism, unspecified: Secondary | ICD-10-CM | POA: Diagnosis not present

## 2017-09-24 DIAGNOSIS — I482 Chronic atrial fibrillation: Secondary | ICD-10-CM | POA: Insufficient documentation

## 2017-09-24 DIAGNOSIS — Z5309 Procedure and treatment not carried out because of other contraindication: Secondary | ICD-10-CM | POA: Diagnosis not present

## 2017-09-24 DIAGNOSIS — Z880 Allergy status to penicillin: Secondary | ICD-10-CM | POA: Insufficient documentation

## 2017-09-24 DIAGNOSIS — M4850XA Collapsed vertebra, not elsewhere classified, site unspecified, initial encounter for fracture: Secondary | ICD-10-CM

## 2017-09-24 LAB — CBC
HEMATOCRIT: 37.6 % (ref 36.0–46.0)
HEMOGLOBIN: 11.5 g/dL — AB (ref 12.0–15.0)
MCH: 27.1 pg (ref 26.0–34.0)
MCHC: 30.6 g/dL (ref 30.0–36.0)
MCV: 88.5 fL (ref 78.0–100.0)
Platelets: 248 10*3/uL (ref 150–400)
RBC: 4.25 MIL/uL (ref 3.87–5.11)
RDW: 19.6 % — AB (ref 11.5–15.5)
WBC: 13.1 10*3/uL — AB (ref 4.0–10.5)

## 2017-09-24 LAB — BASIC METABOLIC PANEL
ANION GAP: 12 (ref 5–15)
BUN: 36 mg/dL — AB (ref 8–23)
CHLORIDE: 104 mmol/L (ref 98–111)
CO2: 28 mmol/L (ref 22–32)
Calcium: 9.5 mg/dL (ref 8.9–10.3)
Creatinine, Ser: 1.54 mg/dL — ABNORMAL HIGH (ref 0.44–1.00)
GFR, EST AFRICAN AMERICAN: 32 mL/min — AB (ref >60)
GFR, EST NON AFRICAN AMERICAN: 27 mL/min — AB (ref >60)
Glucose, Bld: 122 mg/dL — ABNORMAL HIGH (ref 70–99)
POTASSIUM: 4.2 mmol/L (ref 3.5–5.1)
SODIUM: 144 mmol/L (ref 135–145)

## 2017-09-24 LAB — PROTIME-INR
INR: 1.71
Prothrombin Time: 19.9 seconds — ABNORMAL HIGH (ref 11.4–15.2)

## 2017-09-24 MED ORDER — VANCOMYCIN HCL IN DEXTROSE 1-5 GM/200ML-% IV SOLN: 1000.0000 mg | INTRAVENOUS | Status: DC

## 2017-09-24 MED ORDER — ACETAMINOPHEN 500 MG PO TABS
750.0000 mg | ORAL_TABLET | Freq: Once | ORAL | Status: AC
  Administered 2017-09-24: 750 mg via ORAL

## 2017-09-24 MED ORDER — ACETAMINOPHEN 500 MG PO TABS
ORAL_TABLET | ORAL | Status: AC
  Filled 2017-09-24: qty 2

## 2017-09-24 MED ORDER — SODIUM CHLORIDE 0.9 % IV SOLN
INTRAVENOUS | Status: DC
  Administered 2017-09-24: 09:00:00 via INTRAVENOUS

## 2017-09-24 NOTE — Progress Notes (Signed)
Patient planned for image-guided thoracic 7 kyphoplasty/vertebroplasty today with Dr. Bonnielee HaffHoss.  INR was 1.71 seconds this AM. Patient is currently taking coumadin for afib- niece reports last dose taken 5 days ago.  Dr. Bonnielee HaffHoss and I went to speak with patient and niece.  Informed patient and niece that it would be unsafe to proceed with procedure today with INR 1.71.  Discussed risks if we were to proceed.  Informed patient and niece that we will reschedule procedure ASAP, but it may not be with Dr. Bonnielee HaffHoss.  All questions answered and concerns addressed. Patient and niece convey understanding and agree with plan.  Waylan Bogalexandra M Louk, PA-C 09/24/2017, 10:34 AM

## 2017-09-24 NOTE — H&P (Addendum)
Chief Complaint: Patient was seen in consultation today for thoracic 7 compression fracture.  Referring Physician(s): Corrie Mckusick  Supervising Physician: Marybelle Killings  Patient Status: [facility] - Out-pt  History of Present Illness: Tonya Farley is a 84 y.o. female with a past medical history of hypertension, afib, diastolic HF, pulmonary hypertension, hyperlipidemia, CKD stage IV, hypothyroidism, and chronic back pain. She is known to IR, she underwent lumbar 1 and lumbar 4 kyphoplasties 06/30/2012 with Dr. Barbie Banner. She was seen in consultation by Dr. Earleen Newport 09/18/2017 regarding her spontaneous start of 10/10 back pain. She underwent xray which demonstrated an acute thoracic 7 compression fracture.  Patient presents today for possible image-guided thoracic 7 compression fracture. Patient awake and alert laying in bed. Accompanied by niece at bedside. Complains of back pain, rated 8-9/10. Denies fever, chills, numbness/tingling down legs, dysuria, or urinary frequency.  Patient is currently taking coumadin for afib- niece reports last dose taken 5 days ago.   Past Medical History:  Diagnosis Date  . Chronic a-fib ([place])   . Chronic back pain   . Chronic diastolic (congestive) heart failure (HCC)    a. Echo 3/17: mild LVH, EF 55%, no RWMA, mod AS (mean 10 mmHg, peak 21 mmHg), MAC, mild MR, severe LAE, mod reduced RVSF, mild RAE, mod TR, PASP 65 mmHg  . CKD (chronic kidney disease) stage 4, GFR 15-29 ml/min (HCC) 09/11/2015  . Hyperlipidemia   . Hypertension   . Hypothyroidism   . Pulmonary hypertension (HCC)    PASP 65 mmHg at Echo 3/17  . Thyroid disease     Past Surgical History:  Procedure Laterality Date  . APPENDECTOMY    . cataracts     bilateral  . EYE SURGERY     left eye "hole"  . IR RADIOLOGIST EVAL & MGMT  09/18/2017  . TONSILLECTOMY      Allergies: Penicillins; Sulfa antibiotics; and Fish allergy  Medications: Prior to Admission medications   Medication Sig  Start Date End Date Taking? Authorizing Provider  acetaminophen (TYLENOL) 500 MG tablet Take 2 tablets (1,000 mg total) by mouth every 6 (six) hours as needed. 09/17/17   Davonna Belling, MD  allopurinol (ZYLOPRIM) 100 MG tablet Take 150 mg by mouth daily.     [provider]  atorvastatin (LIPITOR) 10 MG tablet Take 10 mg by mouth daily.    [provider]  B Complex Vitamins (B COMPLEX 1 PO) Take 1 tablet by mouth daily.    [provider]  brimonidine (ALPHAGAN) 0.15 % ophthalmic solution Place 1 drop into both eyes 3 (three) times daily.    [provider]  cholecalciferol (VITAMIN D) 400 units TABS tablet Take 400 Units by mouth daily.    [provider]  diltiazem (CARDIZEM CD) 120 MG 24 hr capsule Take 1 capsule (120 mg total) by mouth daily. 08/26/17   Jettie Booze, MD  feeding supplement (BOOST / RESOURCE BREEZE) LIQD Take 1 Container by mouth 2 (two) times daily between meals. 12/27/16   Ghimire, Henreitta Leber, MD  furosemide (LASIX) 80 MG tablet TAKE 1 TABLET BY MOUTH EVERY DAY 04/28/17   Jettie Booze, MD  isosorbide mononitrate (IMDUR) 30 MG 24 hr tablet Take 1 tablet (30 mg total) by mouth daily. 09/05/17   Jettie Booze, MD  latanoprost (XALATAN) 0.005 % ophthalmic solution Place 1 drop into both eyes at bedtime. 01/10/14   [provider]  levalbuterol Penne Lash) 0.63 MG/3ML nebulizer solution Take 3 mLs (0.63  mg total) by nebulization every 6 (six) hours as needed for wheezing or shortness of breath. Patient not taking: Reported on 09/17/2017 07/14/17 07/14/18  Elgergawy, Silver Huguenin, MD  levothyroxine (SYNTHROID, LEVOTHROID) 88 MCG tablet Take 88 mcg by mouth daily before breakfast.    [provider]  metolazone (ZAROXOLYN) 2.5 MG tablet Take 1 tablet (2.5 mg total) by mouth as needed (take if weight goes up 3 lbs in 1 day or 5 lbs in 1 week). Patient taking differently: Take 2.5 mg by mouth as needed (take if  weight goes up 3 lbs in 1 day or 5 lbs in 1 week or fluid).  10/27/15   Richardson Dopp T, PA-C  metoprolol tartrate (LOPRESSOR) 100 MG tablet Take 1 tablet (100 mg total) by mouth 2 (two) times daily. 07/14/17   Elgergawy, Silver Huguenin, MD  ondansetron (ZOFRAN ODT) 4 MG disintegrating tablet Take 1 tablet (4 mg total) by mouth every 4 (four) hours as needed for nausea or vomiting. 09/17/17   Davonna Belling, MD  ondansetron (ZOFRAN-ODT) 4 MG disintegrating tablet Take 1 tablet (4 mg total) by mouth every 8 (eight) hours as needed for nausea or vomiting. Patient not taking: Reported on 09/17/2017 09/17/16   Davonna Belling, MD  Polyethyl Glycol-Propyl Glycol (SYSTANE OP) Apply 2 drops to eye daily as needed (dry eyes).     [provider]  potassium chloride SA (K-DUR,KLOR-CON) 20 MEQ tablet Take 0.5 tablets (10 mEq total) by mouth daily. 07/14/17   Elgergawy, Silver Huguenin, MD  timolol (TIMOPTIC) 0.5 % ophthalmic solution Place 1 drop into both eyes every morning.  12/28/13   [provider]  traMADol (ULTRAM) 50 MG tablet Take 1 tablet (50 mg total) by mouth every 6 (six) hours as needed. 09/17/17   Davonna Belling, MD  warfarin (COUMADIN) 1 MG tablet Take 2.5 tablets daily as directed by Coumadin clinic 09/17/17   Jettie Booze, MD     Family History  Problem Relation Age of Onset  . Heart disease Sister   . Heart disease Sister   . Cancer Mother   . Other Father   . Heart attack Father     Social History   Socioeconomic History  . Marital status: Widowed    Spouse name: Not on file  . Number of children: Not on file  . Years of education: Not on file  . Highest education level: Not on file  Occupational History  . Not on file  Social Needs  . Financial resource strain: Not on file  . Food insecurity:    Worry: Not on file    Inability: Not on file  . Transportation needs:    Medical: Not on file    Non-medical: Not on file  Tobacco Use  . Smoking status: Never  Smoker  . Smokeless tobacco: Never Used  Substance and Sexual Activity  . Alcohol use: No  . Drug use: No  . Sexual activity: Not on file  Lifestyle  . Physical activity:    Days per week: Not on file    Minutes per session: Not on file  . Stress: Not on file  Relationships  . Social connections:    Talks on phone: Not on file    Gets together: Not on file    Attends religious service: Not on file    Active member of club or organization: Not on file    Attends meetings of clubs or organizations: Not on file    Relationship  status: Not on file  Other Topics Concern  . Not on file  Social History Narrative  . Not on file     Review of Systems: A 12 point ROS discussed and pertinent positives are indicated in the HPI above.  All other systems are negative.  Review of Systems  Constitutional: Negative for chills and fever.  Respiratory: Negative for shortness of breath and wheezing.   Cardiovascular: Negative for chest pain and palpitations.  Genitourinary: Negative for dysuria and frequency.  Musculoskeletal: Positive for back pain.  Neurological: Negative for numbness.  Psychiatric/Behavioral: Negative for behavioral problems and confusion.    Vital Signs: BP (!) 173/79   Pulse (!) 120   Temp (!) 97.3 F (36.3 C) (Oral)   Ht _0  (1.626 m)   Wt 133 lb (60.3 kg)   SpO2 95%   BMI 22.83 kg/m   Physical Exam  Constitutional: She is oriented to person, place, and time. She appears well-developed and well-nourished. No distress.  Cardiovascular: Normal rate, regular rhythm and normal heart sounds.  No murmur heard. Pulmonary/Chest: Effort normal and breath sounds normal. No respiratory distress. She has no wheezes.  Musculoskeletal:  Moderate tenderness of midline spine at approximate level of thoracic 7.  Neurological: She is alert and oriented to person, place, and time.  Skin: Skin is warm and dry.  Psychiatric: She has a normal mood and affect. Her behavior is  normal. Judgment and thought content normal.  Nursing note and vitals reviewed.    MD Evaluation Airway: WNL Heart: WNL Abdomen: WNL Chest/ Lungs: WNL ASA  Classification: 2 Mallampati/Airway Score: Two   Imaging: Dg Chest 2 View  Result Date: 09/17/2017 CLINICAL DATA:  Two days of upper back pain EXAM: CHEST - 2 VIEW COMPARISON:  Chest x-ray dated July 10, 2017 and chest x-ray of December 22, 2016 FINDINGS: The lungs are adequately inflated. The interstitial markings are coarse. This is not new however. There is chronically increased density at the left lung base. There is there tiny bilateral pleural effusions layering posteriorly which have decreased in size. Cardiac silhouette is enlarged. The pulmonary vascularity is not engorged. There is calcification in the wall of the thoracic aorta. There is multilevel degenerative disc disease of the thoracic spine. Mild loss of height of T8 and T10 is suspected. This is demonstrated to better advantage on the accompanying thoracic spine series. IMPRESSION: Chronic bronchitic changes. Small bilateral pleural effusions which have decreased in size since the previous study. No acute pneumonia nor pulmonary edema. Stable cardiomegaly. Thoracic aortic atherosclerosis. Electronically Signed   By: David  Martinique M.D.   On: 09/17/2017 17:01   Dg Thoracic Spine 2 View  Result Date: 09/17/2017 CLINICAL DATA:  Back pain EXAM: THORACIC SPINE 2 VIEWS COMPARISON:  CT chest 06/12/2015 FINDINGS: Limited anatomical evaluation due to osteopenia and patient size. Moderate compression fracture T7 and T9. These were not present on the prior CT. Severe compression fracture L1 with cement vertebral augmentation. IMPRESSION: Moderate compression fractures T7 and T9 of indeterminate age but not present in 2017 and not definitely present on a lateral chest x-ray of 07/10/2017. Limited study Electronically Signed   By: Franchot Gallo M.D.   On: 09/17/2017 16:56   Dg Lumbar  Spine 2-3 Views  Result Date: 09/17/2017 CLINICAL DATA:  Acute upper back pain. EXAM: LUMBAR SPINE - 2-3 VIEW COMPARISON:  Radiographs of June 23, 2012. FINDINGS: Status post kyphoplasty of L1 and L4 vertebral bodies. No acute fracture or spondylolisthesis is noted.  Stable old L3 compression fracture is noted. Atherosclerosis of abdominal aorta is noted. IMPRESSION: Status post kyphoplasty of L1 and L4 vertebral bodies. Old L3 compression fracture is noted. No acute abnormality seen in the lumbar spine. Aortic Atherosclerosis (ICD10-I70.0). Electronically Signed   By: Marijo Conception, M.D.   On: 09/17/2017 16:55   Mr Thoracic Spine Wo Contrast  Result Date: 09/18/2017 CLINICAL DATA:  Initial evaluation for acute severe mid back pain. Evaluate for possible compression fracture. EXAM: MRI THORACIC SPINE WITHOUT CONTRAST TECHNIQUE: Multiplanar, multisequence MR imaging of the thoracic spine was performed. No intravenous contrast was administered. COMPARISON:  Prior radiograph from 09/17/2017. FINDINGS: Alignment: Examination limited as the patient was unable to tolerate the full length of the exam. Exaggeration of the normal thoracic kyphosis.  No listhesis. Vertebrae: There is an acute compression fracture involving the T7 vertebral body with associated marrow edema. Height loss measures up to 40% height loss without bony retropulsion. This is benign/osteoporotic in appearance with no underlying pathologic lesion. Chronic compression deformity at L1 with severe height loss and 6 mm bony retropulsion. Sequelae of prior vertebral augmentation at this level. Chronic height loss at the adjacent inferior endplate of H08. Chronic compression deformity involving L3 noted as well. Vertebral body heights otherwise maintained. No other acute, subacute, or chronic compression fracture identified. Bone marrow signal intensity within normal limits. Benign hemangioma noted within the T10 vertebral body. No other discrete or  worrisome osseous lesions. Cord: Signal intensity within the thoracic spinal cord is normal. Conus medullaris terminates at the L1-2 level. Paraspinal and other soft tissues: Mild paraspinous soft tissue edema adjacent to the T7 compression fracture. Paraspinous soft tissues demonstrate no other acute abnormality. Small layering bilateral pleural effusions. Kidneys are atrophic with multiple renal cysts bilaterally. Disc levels: T5-6: Small central disc protrusion indents the ventral thecal sac without significant stenosis. T6-7: Moderate sized central disc protrusion indents the ventral thecal sac. Mild cord flattening without cord signal changes. No significant stenosis. T9-10: Shallow right paracentral disc protrusion mildly flattens the right ventral thecal sac. No stenosis. T11-12: Bilateral facet hypertrophy, greater on the left. No stenosis. T12-L1: Disc bulge with 6 mm bony retropulsion related to the chronic L1 compression fracture. Flattening with partial effacement of the ventral CSF with mild cord flattening. Mild to moderate spinal stenosis. IMPRESSION: 1. Acute T7 compression fracture with up to 40% height loss without bony retropulsion. This would be amendable to vertebral augmentation. 2. Chronic L1 compression fracture with sequelae of prior vertebral augmentation. Associated 6 mm bony retropulsion with mild to moderate spinal stenosis. 3. Central disc protrusion at T6-7 with resultant mild spinal stenosis. 4. Small layering bilateral pleural effusions. Electronically Signed   By: Jeannine Boga M.D.   On: 09/18/2017 15:36   Ir Radiologist Eval & Mgmt  Result Date: 09/18/2017 Please refer to notes tab for details about interventional procedure. (Op Note)   Labs:  CBC: Recent Labs    12/26/16 1014 07/09/17 1146 07/10/17 0337 07/10/17 2011  WBC 12.2* 13.4* 12.0* 16.6*  HGB 10.9* 10.0* 11.9* 12.5  HCT 35.2* 31.8* 37.5 40.4  PLT 250 273 217 236    COAGS: Recent Labs     07/14/17 0649 08/08/17 0935 08/14/17 1033 09/05/17 0939  INR 2.86 1.5 2.6 2.9    BMP: Recent Labs    07/11/17 0312 07/12/17 0401 07/13/17 1001 07/14/17 0649  NA 143 142 139 139  K 4.2 4.3 5.0 4.5  CL 106 102 102 103  CO2 24 28  26 27  GLUCOSE 132* 115* 232* 130*  BUN 25* 29* 41* 42*  CALCIUM 9.3 9.6 9.4 9.4  CREATININE 1.49* 1.57* 1.78* 1.54*  GFRNONAA 29* 27* 23* 28*  GFRAA 33* 31* 27* 32*    LIVER FUNCTION TESTS: Recent Labs    12/22/16 1153 07/09/17 1146 07/10/17 2011  BILITOT 1.0 1.1 1.5*  AST _0 ALT _1 ALKPHOS 59 73 73  PROT 6.8 6.6 6.3*  ALBUMIN 3.3* 3.5 3.4*    TUMOR MARKERS: No results for input(s): AFPTM, CEA, CA199, CHROMGRNA in the last 8760 hours.  Assessment and Plan:  Thoracic 7 compression fracture. Plan for image-guided thoracic 7 kyphoplasty/vertebroplasty today with Dr. Barbie Banner. Patient is NPO. Denies fever. Patient is currently taking coumadin for afib- niece reports last dose taken 5 days ago. INR pending.  Risks and benefits of thoracic 7 kyphoplasty/vertebroplasty were discussed with the patient including, but not limited to education regarding the natural healing process of compression fractures without intervention, bleeding, infection, cement migration which may cause spinal cord damage, paralysis, pulmonary embolism or even death. This interventional procedure involves the use of X-rays and because of the nature of the planned procedure, it is possible that we will have prolonged use of X-ray fluoroscopy. Potential radiation risks to you include (but are not limited to) the following: - A slightly elevated risk for cancer  several years later in life. This risk is typically less than 0.5% percent. This risk is low in comparison to the normal incidence of human cancer, which is 33% for women and 50% for men according to the [employer]. - Radiation induced injury can include skin redness, resembling a rash, tissue  breakdown / ulcers and hair loss (which can be temporary or permanent).  The likelihood of either of these occurring depends on the difficulty of the procedure and whether you are sensitive to radiation due to previous procedures, disease, or genetic conditions.  IF your procedure requires a prolonged use of radiation, you will be notified and given written instructions for further action.  It is your responsibility to monitor the irradiated area for the 2 weeks following the procedure and to notify your physician if you are concerned that you have suffered a radiation induced injury.   All of the patient's questions were answered, patient is agreeable to proceed. Consent signed and in chart.  Thank you for this interesting consult.  I greatly enjoyed meeting [employer] and look forward to participating in their care.  A copy of this report was sent to the requesting provider on this date.  Electronically Signed: Earley Abide, PA-C 09/24/2017, 9:31 AM   I spent a total of 25 Minutes in face to face in clinical consultation, greater than 50% of which was counseling/coordinating care for thoracic 7 compression fracture.

## 2017-09-26 ENCOUNTER — Ambulatory Visit (HOSPITAL_COMMUNITY)
Admission: RE | Admit: 2017-09-26 | Discharge: 2017-09-26 | Disposition: A | Discharge status: Home / Self Care | Payer: Medicare Other | Source: Ambulatory Visit | Attending: Interventional Radiology | Admitting: Interventional Radiology

## 2017-09-26 ENCOUNTER — Ambulatory Visit (HOSPITAL_COMMUNITY): Payer: Medicare Other

## 2017-09-26 ENCOUNTER — Encounter (HOSPITAL_COMMUNITY): Payer: Self-pay

## 2017-09-26 DIAGNOSIS — X58XXXA Exposure to other specified factors, initial encounter: Secondary | ICD-10-CM | POA: Diagnosis not present

## 2017-09-26 DIAGNOSIS — M4850XA Collapsed vertebra, not elsewhere classified, site unspecified, initial encounter for fracture: Secondary | ICD-10-CM | POA: Insufficient documentation

## 2017-09-26 DIAGNOSIS — Z79890 Hormone replacement therapy (postmenopausal): Secondary | ICD-10-CM | POA: Insufficient documentation

## 2017-09-26 DIAGNOSIS — Z91013 Allergy to seafood: Secondary | ICD-10-CM | POA: Insufficient documentation

## 2017-09-26 DIAGNOSIS — Z79899 Other long term (current) drug therapy: Secondary | ICD-10-CM | POA: Diagnosis not present

## 2017-09-26 DIAGNOSIS — Z7901 Long term (current) use of anticoagulants: Secondary | ICD-10-CM | POA: Insufficient documentation

## 2017-09-26 DIAGNOSIS — Z880 Allergy status to penicillin: Secondary | ICD-10-CM | POA: Insufficient documentation

## 2017-09-26 DIAGNOSIS — Z882 Allergy status to sulfonamides status: Secondary | ICD-10-CM | POA: Diagnosis not present

## 2017-09-26 HISTORY — PX: IR KYPHO THORACIC WITH BONE BIOPSY: IMG5518

## 2017-09-26 LAB — CBC
HEMATOCRIT: 38.2 % (ref 36.0–46.0)
HEMOGLOBIN: 11.4 g/dL — AB (ref 12.0–15.0)
MCH: 26.8 pg (ref 26.0–34.0)
MCHC: 29.8 g/dL — ABNORMAL LOW (ref 30.0–36.0)
MCV: 89.7 fL (ref 78.0–100.0)
Platelets: 248 10*3/uL (ref 150–400)
RBC: 4.26 MIL/uL (ref 3.87–5.11)
RDW: 19.6 % — ABNORMAL HIGH (ref 11.5–15.5)
WBC: 12.7 10*3/uL — AB (ref 4.0–10.5)

## 2017-09-26 LAB — BASIC METABOLIC PANEL
ANION GAP: 9 (ref 5–15)
BUN: 36 mg/dL — ABNORMAL HIGH (ref 8–23)
CHLORIDE: 105 mmol/L (ref 98–111)
CO2: 29 mmol/L (ref 22–32)
Calcium: 9.3 mg/dL (ref 8.9–10.3)
Creatinine, Ser: 1.81 mg/dL — ABNORMAL HIGH (ref 0.44–1.00)
GFR calc Af Amer: 26 mL/min — ABNORMAL LOW (ref >60)
GFR, EST NON AFRICAN AMERICAN: 22 mL/min — AB (ref >60)
Glucose, Bld: 117 mg/dL — ABNORMAL HIGH (ref 70–99)
POTASSIUM: 4.4 mmol/L (ref 3.5–5.1)
SODIUM: 143 mmol/L (ref 135–145)

## 2017-09-26 LAB — PROTIME-INR
INR: 1.35
Prothrombin Time: 16.5 seconds — ABNORMAL HIGH (ref 11.4–15.2)

## 2017-09-26 MED ORDER — MIDAZOLAM HCL 2 MG/2ML IJ SOLN
INTRAMUSCULAR | Status: AC | PRN
  Administered 2017-09-26 (×2): 0.5 mg via INTRAVENOUS

## 2017-09-26 MED ORDER — SODIUM CHLORIDE 0.9 % IV SOLN
INTRAVENOUS | Status: AC | PRN
  Administered 2017-09-26: 10 mL/h via INTRAVENOUS

## 2017-09-26 MED ORDER — VANCOMYCIN HCL IN DEXTROSE 1-5 GM/200ML-% IV SOLN
1000.0000 mg | INTRAVENOUS | Status: AC
  Administered 2017-09-26: 1000 mg via INTRAVENOUS

## 2017-09-26 MED ORDER — FENTANYL CITRATE (PF) 100 MCG/2ML IJ SOLN
INTRAMUSCULAR | Status: AC
  Filled 2017-09-26: qty 2

## 2017-09-26 MED ORDER — MIDAZOLAM HCL 2 MG/2ML IJ SOLN
INTRAMUSCULAR | Status: AC
  Filled 2017-09-26: qty 2

## 2017-09-26 MED ORDER — IOPAMIDOL (ISOVUE-300) INJECTION 61%
INTRAVENOUS | Status: AC
  Administered 2017-09-26: 7 mL
  Filled 2017-09-26: qty 50

## 2017-09-26 MED ORDER — LIDOCAINE HCL 1 % IJ SOLN
INTRAMUSCULAR | Status: AC
  Filled 2017-09-26: qty 20

## 2017-09-26 MED ORDER — SODIUM CHLORIDE 0.9 % IV SOLN
INTRAVENOUS | Status: DC
  Administered 2017-09-26: 09:00:00 via INTRAVENOUS

## 2017-09-26 MED ORDER — VANCOMYCIN HCL IN DEXTROSE 1-5 GM/200ML-% IV SOLN
INTRAVENOUS | Status: AC
  Filled 2017-09-26: qty 200

## 2017-09-26 MED ORDER — LIDOCAINE HCL (PF) 1 % IJ SOLN
INTRAMUSCULAR | Status: AC | PRN
  Administered 2017-09-26: 5 mL

## 2017-09-26 MED ORDER — FENTANYL CITRATE (PF) 100 MCG/2ML IJ SOLN
INTRAMUSCULAR | Status: AC | PRN
  Administered 2017-09-26 (×2): 12.5 ug via INTRAVENOUS

## 2017-09-26 NOTE — Progress Notes (Signed)
Referring Physician(s): Dr. Burton Apleyonald Roberts (PCP)   Supervising Physician: Jolaine ClickHoss, Arthur  [facility]  Chief Complaint:  Painful acute fracture thoracic 7   Subjective:  Patient presenting for image guided thoracic 7 kyphoplasty - previously seen and consented for same on 7/3 however INR 1.7 at that time and procedure was rescheduled for today. Patient continues to hold Coumadin (7 days total) and denies any new complaints. Niece states that she becomes nauseous with Fentanyl and is requesting Zofran with procedure.   Review of Systems: A 12 point ROS discussed and pertinent positives are indicated in the HPI above.  All other systems are negative.  Review of Systems  Constitutional: Negative for chills and fever.  Respiratory: Negative for shortness of breath and wheezing.   Cardiovascular: Negative for chest pain and palpitations. Abdominal: Soft, NTND, +BS  Musculoskeletal: Positive for back pain.  Neurological: Negative for numbness.  Psychiatric/Behavioral: Negative for confusion.    Allergies: Penicillins; Sulfa antibiotics; and Fish allergy  Medications: Prior to Admission medications   Medication Sig Start Date End Date Taking? Authorizing Provider  acetaminophen (TYLENOL) 500 MG tablet Take 2 tablets (1,000 mg total) by mouth every 6 (six) hours as needed. Patient taking differently: Take 750 mg by mouth every 6 (six) hours as needed for mild pain.  09/17/17   Benjiman CorePickering, Nathan, MD  allopurinol (ZYLOPRIM) 100 MG tablet Take 150 mg by mouth daily.     [provider]  atorvastatin (LIPITOR) 10 MG tablet Take 10 mg by mouth daily.    [provider]  B Complex Vitamins (B COMPLEX 1 PO) Take 1 tablet by mouth daily.    [provider]  brimonidine (ALPHAGAN) 0.15 % ophthalmic solution Place 1 drop into both eyes 3 (three) times daily.    [provider]  cholecalciferol (VITAMIN D) 400 units TABS tablet Take  400 Units by mouth daily.    [provider]  diltiazem (CARDIZEM CD) 120 MG 24 hr capsule Take 1 capsule (120 mg total) by mouth daily. 08/26/17   Corky CraftsVaranasi, Jayadeep S, MD  feeding supplement (BOOST / RESOURCE BREEZE) LIQD Take 1 Container by mouth 2 (two) times daily between meals. Patient taking differently: Take 1 Container by mouth daily.  12/27/16   Ghimire, Werner LeanShanker M, MD  furosemide (LASIX) 80 MG tablet TAKE 1 TABLET BY MOUTH EVERY DAY 04/28/17   Corky CraftsVaranasi, Jayadeep S, MD  isosorbide mononitrate (IMDUR) 30 MG 24 hr tablet Take 1 tablet (30 mg total) by mouth daily. 09/05/17   Corky CraftsVaranasi, Jayadeep S, MD  latanoprost (XALATAN) 0.005 % ophthalmic solution Place 1 drop into both eyes at bedtime. 01/10/14   [provider]  levalbuterol Pauline Aus(XOPENEX) 0.63 MG/3ML nebulizer solution Take 3 mLs (0.63 mg total) by nebulization every 6 (six) hours as needed for wheezing or shortness of breath. 07/14/17 07/14/18  Elgergawy, Leana Roeawood S, MD  levothyroxine (SYNTHROID, LEVOTHROID) 88 MCG tablet Take 88 mcg by mouth daily before breakfast.    [provider]  metolazone (ZAROXOLYN) 2.5 MG tablet Take 1 tablet (2.5 mg total) by mouth as needed (take if weight goes up 3 lbs in 1 day or 5 lbs in 1 week). Patient taking differently: Take 2.5 mg by mouth as needed (take if weight goes up 3 lbs in 1 day or 5 lbs in 1 week or fluid).  10/27/15   Tereso NewcomerWeaver, Scott T, PA-C  metoprolol tartrate (LOPRESSOR) 100 MG tablet Take 1 tablet (100 mg total) by mouth  2 (two) times daily. 07/14/17   Elgergawy, Leana Roe, MD  ondansetron (ZOFRAN ODT) 4 MG disintegrating tablet Take 1 tablet (4 mg total) by mouth every 4 (four) hours as needed for nausea or vomiting. 09/17/17   Benjiman Core, MD  ondansetron (ZOFRAN-ODT) 4 MG disintegrating tablet Take 1 tablet (4 mg total) by mouth every 8 (eight) hours as needed for nausea or vomiting. Patient not taking: Reported on 09/17/2017 09/17/16   Benjiman Core, MD  Polyethyl  Glycol-Propyl Glycol (SYSTANE OP) Apply 2 drops to eye daily as needed (dry eyes).     [provider]  potassium chloride SA (K-DUR,KLOR-CON) 20 MEQ tablet Take 0.5 tablets (10 mEq total) by mouth daily. 07/14/17   Elgergawy, Leana Roe, MD  timolol (TIMOPTIC) 0.5 % ophthalmic solution Place 1 drop into both eyes every morning.  12/28/13   [provider]  traMADol (ULTRAM) 50 MG tablet Take 1 tablet (50 mg total) by mouth every 6 (six) hours as needed. 09/17/17   Benjiman Core, MD  warfarin (COUMADIN) 1 MG tablet Take 2.5 tablets daily as directed by Coumadin clinic 09/17/17   Corky Crafts, MD     Vital Signs: BP 136/62 (BP Location: Right Arm)   Pulse 94   Temp 97.7 F (36.5 C) (Oral)   Ht 5\' 4"  (1.626 m)   Wt 132 lb (59.9 kg)   SpO2 97%   BMI 22.66 kg/m   Physical Exam  Imaging: No results found.  Labs:  CBC: Recent Labs    07/09/17 1146 07/10/17 0337 07/10/17 2011 09/24/17 0926  WBC 13.4* 12.0* 16.6* 13.1*  HGB 10.0* 11.9* 12.5 11.5*  HCT 31.8* 37.5 40.4 37.6  PLT 273 217 236 248    COAGS: Recent Labs    08/08/17 0935 08/14/17 1033 09/05/17 0939 09/24/17 0926  INR 1.5 2.6 2.9 1.71    BMP: Recent Labs    07/12/17 0401 07/13/17 1001 07/14/17 0649 09/24/17 0926  NA 142 139 139 144  K 4.3 5.0 4.5 4.2  CL 102 102 103 104  CO2 28 26 27 28   GLUCOSE 115* 232* 130* 122*  BUN 29* 41* 42* 36*  CALCIUM 9.6 9.4 9.4 9.5  CREATININE 1.57* 1.78* 1.54* 1.54*  GFRNONAA 27* 23* 28* 27*  GFRAA 31* 27* 32* 32*    LIVER FUNCTION TESTS: Recent Labs    12/22/16 1153 07/09/17 1146 07/10/17 2011  BILITOT 1.0 1.1 1.5*  AST 15 24 24   ALT 14 20 17   ALKPHOS 59 73 73  PROT 6.8 6.6 6.3*  ALBUMIN 3.3* 3.5 3.4*    Assessment and Plan:  1. Thoracic 7 fracture - previously consented for kyphoplasty of same however INR on 7/3 was 1.7. Patient instructed to continue to hold coumadin and return for recheck today - INR pending at time of note.  Plan to proceed with image guided kyphoplasty of throacic 7 today as long as INR <1.5   Risks and benefits of image guided thoracic 7 kyphoplasty were discussed with the patient including, but not limited to education regarding the natural healing process of compression fractures without intervention, bleeding, infection, cement migration which may cause spinal cord damage, paralysis, pulmonary embolism or even death.  This interventional procedure involves the use of X-rays and because of the nature of the planned procedure, it is possible that we will have prolonged use of X-ray fluoroscopy.  Potential radiation risks to you include (but are not limited to) the following: - A slightly elevated risk for  cancer  several years later in life. This risk is typically less than 0.5% percent. This risk is low in comparison to the normal incidence of human cancer, which is 33% for women and 50% for men according to the [employer]. - Radiation induced injury can include skin redness, resembling a rash, tissue breakdown / ulcers and hair loss (which can be temporary or permanent).   The likelihood of either of these occurring depends on the difficulty of the procedure and whether you are sensitive to radiation due to previous procedures, disease, or genetic conditions.   IF your procedure requires a prolonged use of radiation, you will be notified and given written instructions for further action.  It is your responsibility to monitor the irradiated area for the 2 weeks following the procedure and to notify your physician if you are concerned that you have suffered a radiation induced injury.    All of the patient and niece's questions were answered, patient and niece agreeable to proceed.  Consent signed and in chart.    Electronically Signed: Robet Leu, PA-C 09/26/2017, 9:15 AM   I spent a total of 35 Minutes at the the patient's bedside AND on the patient's hospital floor or  unit, greater than 50% of which was counseling/coordinating care for image guided thoracic 7 kyphoplasty.

## 2017-09-26 NOTE — Discharge Instructions (Addendum)
Percutaneous Vertebroplasty, Care After °These instructions give you information on caring for yourself after your procedure. Your doctor may also give you more specific instructions. Call your doctor if you have any problems or questions after your procedure. °Follow these instructions at home: °· Take medicine as told by your doctor. °· Keep your wound dry and covered for 24 hours or as told by your doctor. °· Ask your doctor when you can bathe or shower. °· Put an ice pack on your wound. °? Put ice in a plastic bag. °? Place a towel between your skin and the bag. °? Leave the ice on for 15-20 minutes, 3-4 times a day. °· Rest in your bed for 24 hours or as told by your doctor. °· Return to normal activities as told by your doctor. °· Ask your doctor what stretches and exercises you can do. °· Do not bend or lift anything heavy as told by your doctor. °Contact a doctor if: °· Your wound becomes red, puffy (swollen), or tender to the touch. °· You are bleeding or leaking fluid from the wound. °· You are sick to your stomach (nauseous) or throw up (vomit) for more than 24 hours after the procedure. °· Your back pain does not get better. °· You have a fever. °Get help right away if: °· You have bad back pain that comes on suddenly. °· You cannot control when you pee (urinate) or poop (bowel movement). °· You lose feeling (numbness) or have tingling in your legs or feet, or they become weak. °· You have sudden weakness in your arms or legs. °· You have shooting pain down your legs. °· You have chest pain or a hard time breathing. °· You feel dizzy or pass out (faint). °· Your vision changes or you cannot talk as you normally do. °This information is not intended to replace advice given to you by your health care provider. Make sure you discuss any questions you have with your health care provider. °Document Released: 06/05/2009 Document Revised: 08/17/2015 Document Reviewed: 11/17/2012 °Elsevier Interactive Patient  Education © 2018 [employer] °Balloon Kyphoplasty, Care After °Refer to this sheet in the next few weeks. These instructions provide you with information about caring for yourself after your procedure. Your health care provider may also give you more specific instructions. Your treatment has been planned according to current medical practices, but problems sometimes occur. Call your health care provider if you have any problems or questions after your procedure. °What can I expect after the procedure? °After your procedure, it is common to have back pain. °Follow these instructions at home: °Incision care °· Follow instructions from your health care provider about how to take care of your incisions. Make sure you: °? Wash your hands with soap and water before you change your bandage (dressing). If soap and water are not available, use hand sanitizer. °? Change your dressing as told by your health care provider. °? Leave stitches (sutures), skin glue, or adhesive strips in place. These skin closures may need to be in place for 2 weeks or longer. If adhesive strip edges start to loosen and curl up, you may trim the loose edges. Do not remove adhesive strips completely unless your health care provider tells you to do that. °· Check your incision area every day for signs of infection. Watch for: °? Redness, swelling, or pain. °? Fluid, blood, or pus. °· Keep your dressing dry until your health care provider says that it can be removed. °  Activity ° °· Rest your back and avoid intense physical activity for as long as told by your health care provider. °· Return to your normal activities as told by your health care provider. Ask your health care provider what activities are safe for you. °· Do not lift anything that is heavier than 10 lb (4.5 kg). This is about the weight of a gallon of milk. You may need to avoid heavy lifting for several weeks. °General instructions °· Take over-the-counter and prescription medicines  only as told by your health care provider. °· If directed, apply ice to the painful area: °? Put ice in a plastic bag. °? Place a towel between your skin and the bag. °? Leave the ice on for 20 minutes, 2-3 times per day. °· Do not use tobacco products, including cigarettes, chewing tobacco, or e-cigarettes. If you need help quitting, ask your health care provider. °· Keep all follow-up visits as told by your health care provider. This is important. °Contact a health care provider if: °· You have a fever. °· You have redness, swelling, or pain at the site of your incisions. °· You have fluid, blood, or pus coming from your incisions. °· You have pain that gets worse or does not get better with medicine. °· You develop numbness or weakness in any part of your body. °Get help right away if: °· You have chest pain. °· You have difficulty breathing. °· You cannot move your legs. °· You cannot control your bladder or bowel movements. °· You suddenly become weak or numb on one side of your body. °· You become very confused. °· You have trouble speaking or understanding, or both. °This information is not intended to replace advice given to you by your health care provider. Make sure you discuss any questions you have with your health care provider. °Document Released: 11/30/2014 Document Revised: 08/17/2015 Document Reviewed: 07/04/2014 °Elsevier Interactive Patient Education © 2018 [employer] °Moderate Conscious Sedation, Adult, Care After °These instructions provide you with information about caring for yourself after your procedure. Your health care provider may also give you more specific instructions. Your treatment has been planned according to current medical practices, but problems sometimes occur. Call your health care provider if you have any problems or questions after your procedure. °What can I expect after the procedure? °After your procedure, it is common: °· To feel sleepy for several hours. °· To feel  clumsy and have poor balance for several hours. °· To have poor judgment for several hours. °· To vomit if you eat too soon. ° °Follow these instructions at home: °For at least 24 hours after the procedure: ° °· Do not: °? Participate in activities where you could fall or become injured. °? Drive. °? Use heavy machinery. °? Drink alcohol. °? Take sleeping pills or medicines that cause drowsiness. °? Make important decisions or sign legal documents. °? Take care of children on your own. °· Rest. °Eating and drinking °· Follow the diet recommended by your health care provider. °· If you vomit: °? Drink water, juice, or soup when you can drink without vomiting. °? Make sure you have little or no nausea before eating solid foods. °General instructions °· Have a responsible adult stay with you until you are awake and alert. °· Take over-the-counter and prescription medicines only as told by your health care provider. °· If you smoke, do not smoke without supervision. °· Keep all follow-up visits as told by your health care provider. This is   important. °Contact a health care provider if: °· You keep feeling nauseous or you keep vomiting. °· You feel light-headed. °· You develop a rash. °· You have a fever. °Get help right away if: °· You have trouble breathing. °This information is not intended to replace advice given to you by your health care provider. Make sure you discuss any questions you have with your health care provider. °Document Released: 12/30/2012 Document Revised: 08/14/2015 Document Reviewed: 07/01/2015 °Elsevier Interactive Patient Education © 2018 [employer] ° °

## 2017-09-26 NOTE — Procedures (Signed)
T7 KP EBL 0 Comp 0

## 2017-09-29 ENCOUNTER — Emergency Department (HOSPITAL_COMMUNITY)
Admission: EM | Admit: 2017-09-29 | Discharge: 2017-09-29 | Disposition: A | Discharge status: Home / Self Care | Payer: Medicare Other | Attending: Emergency Medicine | Admitting: Emergency Medicine

## 2017-09-29 ENCOUNTER — Other Ambulatory Visit: Payer: Self-pay

## 2017-09-29 ENCOUNTER — Emergency Department (HOSPITAL_COMMUNITY): Payer: Medicare Other

## 2017-09-29 DIAGNOSIS — I5032 Chronic diastolic (congestive) heart failure: Secondary | ICD-10-CM | POA: Diagnosis not present

## 2017-09-29 DIAGNOSIS — N184 Chronic kidney disease, stage 4 (severe): Secondary | ICD-10-CM | POA: Diagnosis not present

## 2017-09-29 DIAGNOSIS — R531 Weakness: Secondary | ICD-10-CM | POA: Diagnosis not present

## 2017-09-29 DIAGNOSIS — Z7901 Long term (current) use of anticoagulants: Secondary | ICD-10-CM | POA: Diagnosis not present

## 2017-09-29 DIAGNOSIS — R5381 Other malaise: Secondary | ICD-10-CM | POA: Diagnosis not present

## 2017-09-29 DIAGNOSIS — I130 Hypertensive heart and chronic kidney disease with heart failure and stage 1 through stage 4 chronic kidney disease, or unspecified chronic kidney disease: Secondary | ICD-10-CM | POA: Diagnosis not present

## 2017-09-29 DIAGNOSIS — Z79899 Other long term (current) drug therapy: Secondary | ICD-10-CM | POA: Diagnosis not present

## 2017-09-29 DIAGNOSIS — M545 Low back pain: Secondary | ICD-10-CM | POA: Diagnosis present

## 2017-09-29 DIAGNOSIS — R110 Nausea: Secondary | ICD-10-CM | POA: Insufficient documentation

## 2017-09-29 DIAGNOSIS — E039 Hypothyroidism, unspecified: Secondary | ICD-10-CM | POA: Diagnosis not present

## 2017-09-29 LAB — BASIC METABOLIC PANEL
ANION GAP: 11 (ref 5–15)
BUN: 35 mg/dL — ABNORMAL HIGH (ref 8–23)
CALCIUM: 9.2 mg/dL (ref 8.9–10.3)
CO2: 28 mmol/L (ref 22–32)
Chloride: 103 mmol/L (ref 98–111)
Creatinine, Ser: 1.68 mg/dL — ABNORMAL HIGH (ref 0.44–1.00)
GFR calc Af Amer: 28 mL/min — ABNORMAL LOW (ref >60)
GFR, EST NON AFRICAN AMERICAN: 25 mL/min — AB (ref >60)
Glucose, Bld: 109 mg/dL — ABNORMAL HIGH (ref 70–99)
Potassium: 4.2 mmol/L (ref 3.5–5.1)
Sodium: 142 mmol/L (ref 135–145)

## 2017-09-29 LAB — CBC
HCT: 37.7 % (ref 36.0–46.0)
HEMOGLOBIN: 11.5 g/dL — AB (ref 12.0–15.0)
MCH: 27.4 pg (ref 26.0–34.0)
MCHC: 30.5 g/dL (ref 30.0–36.0)
MCV: 90 fL (ref 78.0–100.0)
Platelets: 234 10*3/uL (ref 150–400)
RBC: 4.19 MIL/uL (ref 3.87–5.11)
RDW: 19.5 % — ABNORMAL HIGH (ref 11.5–15.5)
WBC: 11.3 10*3/uL — ABNORMAL HIGH (ref 4.0–10.5)

## 2017-09-29 LAB — URINALYSIS, ROUTINE W REFLEX MICROSCOPIC
Bacteria, UA: NONE SEEN
Bilirubin Urine: NEGATIVE
GLUCOSE, UA: NEGATIVE mg/dL
KETONES UR: NEGATIVE mg/dL
Leukocytes, UA: NEGATIVE
NITRITE: NEGATIVE
PH: 5 (ref 5.0–8.0)
Protein, ur: NEGATIVE mg/dL
Specific Gravity, Urine: 1.01 (ref 1.005–1.030)

## 2017-09-29 LAB — CBG MONITORING, ED: Glucose-Capillary: 114 mg/dL — ABNORMAL HIGH (ref 70–99)

## 2017-09-29 NOTE — ED Triage Notes (Addendum)
Pt to Ed from friends home with c/o of increased back pain, weakness, and nausea since waking this morning. Pt recently had a thoracic vertebrae fusion last Thursday and had discharge paperwork that said if she felt weak to call 911 so they friends she is staying with called. Pt has been eating and drinking normal, at normal baseline A&O x 4. Pt denies falling and is on warfarin for Afib. Pt denies HE, but rates back pain a 8/10. Pt denies any emesis or diarrhea.

## 2017-09-29 NOTE — ED Provider Notes (Signed)
[place] DEPT Provider Note   CSN: [id number] Arrival date & time: 09/29/17  1722     History   Chief Complaint Chief Complaint  Patient presents with  . Back Pain  . Weakness  . Nausea    HPI Tonya Farley is a 84 y.o. female.  HPI  Patient presents for evaluation of back pain, weakness and nausea.  She came by EMS.  She was recently treated with kyphoplasty for thoracic vertebrae compression fracture, on September 26, 2017.  Today she awoke and felt weak all over.  The weakness worsened during the day and family members became concerned that she was having a stroke.  She is currently living with her niece after her kyphoplasty.  She plans on going back to her home, tomorrow where she lives independently.  She states her back pain has improved since the kyphoplasty.  She is eating well.  There is been no fever, vomiting, cough, chest pain, focal weakness or paresthesia.  There are no other known modifying factors.   Past Medical History:  Diagnosis Date  . Chronic a-fib ([place])   . Chronic back pain   . Chronic diastolic (congestive) heart failure (HCC)    a. Echo 3/17: mild LVH, EF 55%, no RWMA, mod AS (mean 10 mmHg, peak 21 mmHg), MAC, mild MR, severe LAE, mod reduced RVSF, mild RAE, mod TR, PASP 65 mmHg  . CKD (chronic kidney disease) stage 4, GFR 15-29 ml/min (HCC) 09/11/2015  . Hyperlipidemia   . Hypertension   . Hypothyroidism   . Pulmonary hypertension (HCC)    PASP 65 mmHg at Echo 3/17  . Thyroid disease     Patient Active Problem List   Diagnosis Date Noted  . Acute on chronic systolic heart failure ([place]) 07/09/2017  . Atrial fibrillation with RVR ([place]) 07/09/2017  . Hypothyroidism, adult 07/09/2017  . Pulmonary HTN ([place]) 07/09/2017  . Leukocytosis 07/09/2017  . Acute respiratory failure with hypoxia ([place]) 07/09/2017  . High blood pressure 07/09/2017  . Chronic systolic heart failure ([place])   . Acute on chronic right heart  failure ([place]) 12/22/2016  . CKD (chronic kidney disease), stage III w/ renal artery stenosis 12/22/2016  . Cellulitis of lower extremity 12/22/2016  . Chronic a-fib ([place]) 12/22/2016  . Hypothyroidism 12/22/2016  . Acute diastolic heart failure ([place]) 12/22/2016  . Elevated troponin 12/22/2016  . Acute hypokalemia 12/22/2016  . HTN (hypertension) 12/22/2016  . Dyslipidemia 12/22/2016  . Pulmonary hypertension, moderate to severe ([place]) 12/22/2016  . Abnormal urinalysis 12/22/2016  . Hypertensive heart disease 05/08/2016  . Chronic diastolic heart failure ([place]) 11/28/2015  . Anticoagulated 11/28/2015  . CKD (chronic kidney disease) stage 3, GFR 30-59 ml/min (HCC) 09/11/2015  . CKD (chronic kidney disease) stage 4, GFR 15-29 ml/min (HCC) 09/11/2015  . Pulmonary hypertension ([place])   . Pleural effusion on left   . Peripheral edema   . Hypokalemia   . Anasarca 08/10/2015  . Acute on chronic respiratory failure with hypoxia ([place])   . Pleural effusion   . Physical deconditioning   . Cough   . HCAP (healthcare-associated pneumonia)   . Chest congestion   . SOB (shortness of breath)   . Swelling   . Acute on chronic diastolic heart failure ([place])   . HLD (hyperlipidemia)   . Influenza due to identified novel influenza A virus with other respiratory manifestations 06/07/2015  . Pleural effusion, bacterial   . Dyspnea   . AKI (acute kidney injury) ([place])   .  CAP (community acquired pneumonia) 05/30/2015  . Renal artery stenosis ([place]) 08/08/2014  . Encounter for therapeutic drug monitoring 04/21/2013  . Hypertension 06/25/2012  . Hyperlipidemia 06/25/2012  . Atrial fibrillation ([place]) 06/25/2012  . Compression fracture of lumbar spine, non-traumatic ([place]) 06/25/2012  . Chronic back pain     Past Surgical History:  Procedure Laterality Date  . APPENDECTOMY    . cataracts     bilateral  . EYE SURGERY     left eye "hole"  . IR KYPHO THORACIC WITH BONE BIOPSY  09/26/2017  . IR  RADIOLOGIST EVAL & MGMT  09/18/2017  . TONSILLECTOMY       OB History   None      Home Medications    Prior to Admission medications   Medication Sig Start Date End Date Taking? Authorizing Provider  acetaminophen (TYLENOL) 500 MG tablet Take 2 tablets (1,000 mg total) by mouth every 6 (six) hours as needed. Patient taking differently: Take 750 mg by mouth every 6 (six) hours as needed for mild pain.  09/17/17  Yes Davonna Belling, MD  allopurinol (ZYLOPRIM) 100 MG tablet Take 150 mg by mouth daily.    Yes [provider]  atorvastatin (LIPITOR) 10 MG tablet Take 10 mg by mouth daily.   Yes [provider]  B Complex Vitamins (B COMPLEX 1 PO) Take 1 tablet by mouth daily.   Yes [provider]  brimonidine (ALPHAGAN) 0.15 % ophthalmic solution Place 1 drop into both eyes 3 (three) times daily.   Yes [provider]  cholecalciferol (VITAMIN D) 400 units TABS tablet Take 400 Units by mouth daily.   Yes [provider]  diltiazem (CARDIZEM CD) 120 MG 24 hr capsule Take 1 capsule (120 mg total) by mouth daily. 08/26/17  Yes Jettie Booze, MD  feeding supplement (BOOST / RESOURCE BREEZE) LIQD Take 1 Container by mouth 2 (two) times daily between meals. Patient taking differently: Take 1 Container by mouth daily.  12/27/16  Yes Ghimire, Henreitta Leber, MD  furosemide (LASIX) 80 MG tablet TAKE 1 TABLET BY MOUTH EVERY DAY 04/28/17  Yes Jettie Booze, MD  isosorbide mononitrate (IMDUR) 30 MG 24 hr tablet Take 1 tablet (30 mg total) by mouth daily. 09/05/17  Yes Jettie Booze, MD  latanoprost (XALATAN) 0.005 % ophthalmic solution Place 1 drop into both eyes at bedtime. 01/10/14  Yes [provider]  levothyroxine (SYNTHROID, LEVOTHROID) 88 MCG tablet Take 88 mcg by mouth daily before breakfast.   Yes [provider]  metolazone (ZAROXOLYN) 2.5 MG tablet Take 1 tablet (2.5 mg total) by mouth as needed (take if weight goes up 3  lbs in 1 day or 5 lbs in 1 week). Patient taking differently: Take 2.5 mg by mouth as needed (take if weight goes up 3 lbs in 1 day or 5 lbs in 1 week or fluid).  10/27/15  Yes Weaver, Scott T, PA-C  metoprolol tartrate (LOPRESSOR) 100 MG tablet Take 1 tablet (100 mg total) by mouth 2 (two) times daily. 07/14/17  Yes Elgergawy, Silver Huguenin, MD  Polyethyl Glycol-Propyl Glycol (SYSTANE OP) Apply 2 drops to eye daily as needed (dry eyes).    Yes [provider]  potassium chloride SA (K-DUR,KLOR-CON) 20 MEQ tablet Take 0.5 tablets (10 mEq total) by mouth daily. 07/14/17  Yes Elgergawy, Silver Huguenin, MD  timolol (TIMOPTIC) 0.5 % ophthalmic solution Place 1 drop into both eyes every morning.  12/28/13  Yes [provider]  warfarin (COUMADIN) 1 MG tablet Take 2.5 tablets daily as directed by Coumadin clinic 09/17/17  Yes Jettie Booze, MD  levalbuterol Penne Lash) 0.63 MG/3ML nebulizer solution Take 3 mLs (0.63 mg total) by nebulization every 6 (six) hours as needed for wheezing or shortness of breath. 07/14/17 07/14/18  Elgergawy, Silver Huguenin, MD  ondansetron (ZOFRAN ODT) 4 MG disintegrating tablet Take 1 tablet (4 mg total) by mouth every 4 (four) hours as needed for nausea or vomiting. Patient not taking: Reported on 09/29/2017 09/17/17   Davonna Belling, MD  ondansetron (ZOFRAN-ODT) 4 MG disintegrating tablet Take 1 tablet (4 mg total) by mouth every 8 (eight) hours as needed for nausea or vomiting. Patient not taking: Reported on 09/29/2017 09/17/16   Davonna Belling, MD  traMADol (ULTRAM) 50 MG tablet Take 1 tablet (50 mg total) by mouth every 6 (six) hours as needed. Patient not taking: Reported on 09/29/2017 09/17/17   Davonna Belling, MD    Family History Family History  Problem Relation Age of Onset  . Heart disease Sister   . Heart disease Sister   . Cancer Mother   . Other Father   . Heart attack Father     Social History Social History   Tobacco Use  . Smoking status: Never  Smoker  . Smokeless tobacco: Never Used  Substance Use Topics  . Alcohol use: No  . Drug use: No     Allergies   Penicillins; Sulfa antibiotics; and Fish allergy   Review of Systems Review of Systems  All other systems reviewed and are negative.    Physical Exam Updated Vital Signs BP (!) 156/66 (BP Location: Right Arm)   Pulse 79   Temp 98.1 F (36.7 C) (Oral)   Resp 19   Ht _0  (1.626 m)   Wt 59 kg (130 lb)   SpO2 97%   BMI 22.31 kg/m   Physical Exam  Constitutional: She is oriented to person, place, and time. She appears well-developed.  Elderly, frail  HENT:  Head: Normocephalic and atraumatic.  Eyes: Pupils are equal, round, and reactive to light. Conjunctivae and EOM are normal.  Neck: Normal range of motion and phonation normal. Neck supple.  Cardiovascular: Normal rate and regular rhythm.  Pulmonary/Chest: Effort normal and breath sounds normal. She exhibits no tenderness.  Abdominal: Soft. She exhibits no distension. There is no tenderness. There is no guarding.  Musculoskeletal: Normal range of motion.  Normal strength arms and legs bilaterally.  Wound left mid thorax, consistent with needle puncture from recent kyphoplasty, healing well without associated drainage, bleeding or swelling.  Neurological: She is alert and oriented to person, place, and time. She exhibits normal muscle tone.  Skin: Skin is warm and dry. Pallor: .ewed.  Psychiatric: She has a normal mood and affect. Her behavior is normal.  Nursing note and vitals reviewed.    ED Treatments / Results  Labs (all labs ordered are listed, but only abnormal results are displayed) Labs Reviewed  BASIC METABOLIC PANEL - Abnormal; Notable for the following components:      Result Value   Glucose, Bld 109 (*)    BUN 35 (*)    Creatinine, Ser 1.68 (*)    GFR calc non Af Amer 25 (*)    GFR calc Af Amer 28 (*)    All other components within normal limits  CBC - Abnormal; Notable for the  following components:   WBC 11.3 (*)    Hemoglobin 11.5 (*)  RDW 19.5 (*)    All other components within normal limits  URINALYSIS, ROUTINE W REFLEX MICROSCOPIC - Abnormal; Notable for the following components:   Hgb urine dipstick MODERATE (*)    All other components within normal limits  CBG MONITORING, ED - Abnormal; Notable for the following components:   Glucose-Capillary 114 (*)    All other components within normal limits    EKG EKG Interpretation  Date/Time:  Monday September 29 2017 17:46:15 EDT Ventricular Rate:  88 PR Interval:    QRS Duration: 114 QT Interval:  404 QTC Calculation: 489 R Axis:   -61 Text Interpretation:  Atrial fibrillation Incomplete left bundle branch block LVH with secondary repolarization abnormality Anterior Q waves, possibly due to LVH Since last tracing now with left axis Confirmed by Daleen Bo [phone]) on 09/29/2017 9:06:14 PM   Radiology Dg Chest 2 View  Result Date: 09/29/2017 CLINICAL DATA:  Recent kyphoplasty, awoke with weakness today, history CHF, hypertension, atrial fibrillation EXAM: CHEST - 2 VIEW COMPARISON:  09/17/2017 FINDINGS: Enlargement of cardiac silhouette with pulmonary vascular congestion. Mediastinal contours normal. Atherosclerotic calcification aorta. Bronchitic changes with bibasilar atelectasis greater on LEFT. Upper lungs clear with underlying emphysematous changes noted. Small bibasilar effusions. No pneumothorax. Bones demineralized with prior spinal augmentation procedures at 2 levels. IMPRESSION: Emphysematous and bronchitic changes question COPD. Bibasilar atelectasis LEFT greater than RIGHT with bibasilar pleural effusions. Electronically Signed   By: Lavonia Dana M.D.   On: 09/29/2017 19:16    Procedures Procedures (including critical care time)  Medications Ordered in ED Medications - No data to display   Initial Impression / Assessment and Plan / ED Course  I have reviewed the triage vital signs and the  nursing notes.  Pertinent labs & imaging results that were available during my care of the patient were reviewed by me and considered in my medical decision making (see chart for details).  Clinical Course as of Sep 29 2153  Mon Sep 29, 2017  2048 Normal except hemoglobin increased  Urinalysis, Routine w reflex microscopic(!) [EW]  2048 Normal except glucose high, BUN high, creatinine high  Basic metabolic panel(!) [EW]  0263 Renal function essentially at baseline for 2 months.   [EW]  2151 Normal except white count high and hemoglobin low  CBC(!) [EW]    Clinical Course User Index [EW] Daleen Bo, MD    Patient Vitals for the past 24 hrs:  BP Temp Temp src Pulse Resp SpO2 Height Weight  09/29/17 2150 (!) 156/66 - - 79 19 97 % - -  09/29/17 2010 (!) 150/90 - - 87 20 99 % - -  09/29/17 1732 - - - - - - _0  (1.626 m) 59 kg (130 lb)  09/29/17 1731 - - - - - 95 % - -  09/29/17 1728 137/75 98.1 F (36.7 C) Oral 65 16 96 % _1  (1.626 m) 59 kg (130 lb)    9:52 PM Reevaluation with update and discussion. After initial assessment and treatment, an updated evaluation reveals she is comfortable has no further complaints.  Findings discussed with family members and all questions answered. Daleen Bo   Medical Decision Making: Nonspecific complaints, with reassuring evaluation.  No overt complications or signs of acute infectious process or metabolic instability.  CRITICAL CARE-no Performed by: Daleen Bo   Nursing Notes Reviewed/ Care Coordinated Applicable Imaging Reviewed Interpretation of Laboratory Data incorporated into ED treatment  The patient appears reasonably screened and/or stabilized for discharge and I doubt  any other medical condition or other [facility] requiring further screening, evaluation, or treatment in the ED at this time prior to discharge.  Plan: Home Medications-continue usual medications; Home Treatments-rest, fluids, regular exercise; return here if the  recommended treatment, does not improve the symptoms; Recommended follow up-PCP as needed.      Final Clinical Impressions(s) / ED Diagnoses   Final diagnoses:  [facility]    ED Discharge Orders    None       Daleen Bo, MD 09/29/17 2155

## 2017-09-29 NOTE — Discharge Instructions (Addendum)
The testing today is reassuring.  There are no signs of infections or complications from your recent kyphoplasty.  Make sure that you are eating 3 meals a day, and drinking plenty of fluids.  Follow-up with your primary care doctor for further problems, as needed.

## 2017-09-29 NOTE — ED Notes (Signed)
Bed: WA06 Expected date:  Expected time:  Means of arrival:  Comments: Per charge  

## 2017-10-04 ENCOUNTER — Encounter (HOSPITAL_COMMUNITY): Payer: Self-pay | Admitting: Pharmacy Technician

## 2017-10-04 ENCOUNTER — Other Ambulatory Visit: Payer: Self-pay

## 2017-10-04 ENCOUNTER — Inpatient Hospital Stay (HOSPITAL_COMMUNITY)
Admission: EM | Admit: 2017-10-04 | Discharge: 2017-10-08 | DRG: 202 | Disposition: A | Discharge status: Skilled Nursing Facility | Payer: Medicare Other | Attending: Internal Medicine | Admitting: Internal Medicine

## 2017-10-04 ENCOUNTER — Emergency Department (HOSPITAL_COMMUNITY): Payer: Medicare Other

## 2017-10-04 DIAGNOSIS — Z7901 Long term (current) use of anticoagulants: Secondary | ICD-10-CM

## 2017-10-04 DIAGNOSIS — R531 Weakness: Secondary | ICD-10-CM

## 2017-10-04 DIAGNOSIS — I2510 Atherosclerotic heart disease of native coronary artery without angina pectoris: Secondary | ICD-10-CM | POA: Diagnosis present

## 2017-10-04 DIAGNOSIS — E785 Hyperlipidemia, unspecified: Secondary | ICD-10-CM | POA: Diagnosis present

## 2017-10-04 DIAGNOSIS — I6523 Occlusion and stenosis of bilateral carotid arteries: Secondary | ICD-10-CM | POA: Diagnosis present

## 2017-10-04 DIAGNOSIS — I4891 Unspecified atrial fibrillation: Secondary | ICD-10-CM | POA: Diagnosis present

## 2017-10-04 DIAGNOSIS — J206 Acute bronchitis due to rhinovirus: Secondary | ICD-10-CM | POA: Diagnosis not present

## 2017-10-04 DIAGNOSIS — E039 Hypothyroidism, unspecified: Secondary | ICD-10-CM | POA: Diagnosis present

## 2017-10-04 DIAGNOSIS — I071 Rheumatic tricuspid insufficiency: Secondary | ICD-10-CM | POA: Diagnosis present

## 2017-10-04 DIAGNOSIS — I130 Hypertensive heart and chronic kidney disease with heart failure and stage 1 through stage 4 chronic kidney disease, or unspecified chronic kidney disease: Secondary | ICD-10-CM | POA: Diagnosis present

## 2017-10-04 DIAGNOSIS — H9190 Unspecified hearing loss, unspecified ear: Secondary | ICD-10-CM | POA: Diagnosis present

## 2017-10-04 DIAGNOSIS — Z91013 Allergy to seafood: Secondary | ICD-10-CM

## 2017-10-04 DIAGNOSIS — Z9089 Acquired absence of other organs: Secondary | ICD-10-CM

## 2017-10-04 DIAGNOSIS — Z9049 Acquired absence of other specified parts of digestive tract: Secondary | ICD-10-CM

## 2017-10-04 DIAGNOSIS — I639 Cerebral infarction, unspecified: Secondary | ICD-10-CM | POA: Diagnosis present

## 2017-10-04 DIAGNOSIS — Z9842 Cataract extraction status, left eye: Secondary | ICD-10-CM

## 2017-10-04 DIAGNOSIS — R000 Tachycardia, unspecified: Secondary | ICD-10-CM | POA: Diagnosis present

## 2017-10-04 DIAGNOSIS — Z882 Allergy status to sulfonamides status: Secondary | ICD-10-CM

## 2017-10-04 DIAGNOSIS — Z8249 Family history of ischemic heart disease and other diseases of the circulatory system: Secondary | ICD-10-CM

## 2017-10-04 DIAGNOSIS — Z79899 Other long term (current) drug therapy: Secondary | ICD-10-CM

## 2017-10-04 DIAGNOSIS — N183 Chronic kidney disease, stage 3 (moderate): Secondary | ICD-10-CM | POA: Diagnosis present

## 2017-10-04 DIAGNOSIS — H409 Unspecified glaucoma: Secondary | ICD-10-CM | POA: Diagnosis present

## 2017-10-04 DIAGNOSIS — I10 Essential (primary) hypertension: Secondary | ICD-10-CM | POA: Diagnosis present

## 2017-10-04 DIAGNOSIS — J208 Acute bronchitis due to other specified organisms: Secondary | ICD-10-CM | POA: Diagnosis not present

## 2017-10-04 DIAGNOSIS — I6340 Cerebral infarction due to embolism of unspecified cerebral artery: Secondary | ICD-10-CM

## 2017-10-04 DIAGNOSIS — I481 Persistent atrial fibrillation: Secondary | ICD-10-CM | POA: Diagnosis present

## 2017-10-04 DIAGNOSIS — Z66 Do not resuscitate: Secondary | ICD-10-CM | POA: Diagnosis present

## 2017-10-04 DIAGNOSIS — M4854XD Collapsed vertebra, not elsewhere classified, thoracic region, subsequent encounter for fracture with routine healing: Secondary | ICD-10-CM | POA: Diagnosis present

## 2017-10-04 DIAGNOSIS — R5381 Other malaise: Secondary | ICD-10-CM

## 2017-10-04 DIAGNOSIS — I63411 Cerebral infarction due to embolism of right middle cerebral artery: Secondary | ICD-10-CM

## 2017-10-04 DIAGNOSIS — E876 Hypokalemia: Secondary | ICD-10-CM | POA: Diagnosis present

## 2017-10-04 DIAGNOSIS — J209 Acute bronchitis, unspecified: Secondary | ICD-10-CM | POA: Diagnosis present

## 2017-10-04 DIAGNOSIS — G9340 Encephalopathy, unspecified: Secondary | ICD-10-CM | POA: Diagnosis not present

## 2017-10-04 DIAGNOSIS — Z9889 Other specified postprocedural states: Secondary | ICD-10-CM

## 2017-10-04 DIAGNOSIS — Z880 Allergy status to penicillin: Secondary | ICD-10-CM

## 2017-10-04 DIAGNOSIS — Z973 Presence of spectacles and contact lenses: Secondary | ICD-10-CM

## 2017-10-04 DIAGNOSIS — I272 Pulmonary hypertension, unspecified: Secondary | ICD-10-CM | POA: Diagnosis present

## 2017-10-04 DIAGNOSIS — R0600 Dyspnea, unspecified: Secondary | ICD-10-CM

## 2017-10-04 DIAGNOSIS — Z79890 Hormone replacement therapy (postmenopausal): Secondary | ICD-10-CM

## 2017-10-04 DIAGNOSIS — E875 Hyperkalemia: Secondary | ICD-10-CM | POA: Diagnosis not present

## 2017-10-04 DIAGNOSIS — L8990 Pressure ulcer of unspecified site, unspecified stage: Secondary | ICD-10-CM

## 2017-10-04 DIAGNOSIS — I5022 Chronic systolic (congestive) heart failure: Secondary | ICD-10-CM | POA: Diagnosis present

## 2017-10-04 DIAGNOSIS — Z9841 Cataract extraction status, right eye: Secondary | ICD-10-CM

## 2017-10-04 HISTORY — DX: Chronic systolic (congestive) heart failure: I50.22

## 2017-10-04 LAB — BASIC METABOLIC PANEL
Anion gap: 10 (ref 5–15)
BUN: 24 mg/dL — ABNORMAL HIGH (ref 8–23)
CO2: 27 mmol/L (ref 22–32)
Calcium: 8.6 mg/dL — ABNORMAL LOW (ref 8.9–10.3)
Chloride: 100 mmol/L (ref 98–111)
Creatinine, Ser: 1.55 mg/dL — ABNORMAL HIGH (ref 0.44–1.00)
GFR calc Af Amer: 31 mL/min — ABNORMAL LOW (ref >60)
GFR calc non Af Amer: 27 mL/min — ABNORMAL LOW (ref >60)
Glucose, Bld: 94 mg/dL (ref 70–99)
Potassium: 3.4 mmol/L — ABNORMAL LOW (ref 3.5–5.1)
Sodium: 137 mmol/L (ref 135–145)

## 2017-10-04 LAB — CBC WITH DIFFERENTIAL/PLATELET
Abs Immature Granulocytes: 0.1 10*3/uL (ref 0.0–0.1)
Basophils Absolute: 0.1 10*3/uL (ref 0.0–0.1)
Basophils Relative: 0 %
Eosinophils Absolute: 0.2 10*3/uL (ref 0.0–0.7)
Eosinophils Relative: 2 %
HCT: 35.1 % — ABNORMAL LOW (ref 36.0–46.0)
Hemoglobin: 10.6 g/dL — ABNORMAL LOW (ref 12.0–15.0)
Immature Granulocytes: 1 %
Lymphocytes Relative: 7 %
Lymphs Abs: 0.9 10*3/uL (ref 0.7–4.0)
MCH: 27.1 pg (ref 26.0–34.0)
MCHC: 30.2 g/dL (ref 30.0–36.0)
MCV: 89.8 fL (ref 78.0–100.0)
Monocytes Absolute: 1.8 10*3/uL — ABNORMAL HIGH (ref 0.1–1.0)
Monocytes Relative: 13 %
Neutro Abs: 11.3 10*3/uL — ABNORMAL HIGH (ref 1.7–7.7)
Neutrophils Relative %: 77 %
Platelets: 196 10*3/uL (ref 150–400)
RBC: 3.91 MIL/uL (ref 3.87–5.11)
RDW: 19.5 % — ABNORMAL HIGH (ref 11.5–15.5)
WBC: 14.5 10*3/uL — ABNORMAL HIGH (ref 4.0–10.5)

## 2017-10-04 LAB — URINALYSIS, ROUTINE W REFLEX MICROSCOPIC
Bacteria, UA: NONE SEEN
Bilirubin Urine: NEGATIVE
GLUCOSE, UA: NEGATIVE mg/dL
Hgb urine dipstick: NEGATIVE
Ketones, ur: NEGATIVE mg/dL
Nitrite: NEGATIVE
PH: 6 (ref 5.0–8.0)
PROTEIN: NEGATIVE mg/dL
Specific Gravity, Urine: 1.004 — ABNORMAL LOW (ref 1.005–1.030)

## 2017-10-04 LAB — BRAIN NATRIURETIC PEPTIDE: B NATRIURETIC PEPTIDE 5: 1201.7 pg/mL — AB (ref 0.0–100.0)

## 2017-10-04 LAB — PROTIME-INR
INR: 2.5
Prothrombin Time: 26.8 seconds — ABNORMAL HIGH (ref 11.4–15.2)

## 2017-10-04 LAB — I-STAT CG4 LACTIC ACID, ED: Lactic Acid, Venous: 1.36 mmol/L (ref 0.5–1.9)

## 2017-10-04 MED ORDER — DILTIAZEM HCL ER COATED BEADS 120 MG PO CP24: 120.0000 mg | ORAL_CAPSULE | Freq: Every day | ORAL | Status: DC

## 2017-10-04 MED ORDER — FUROSEMIDE 10 MG/ML IJ SOLN
20.0000 mg | Freq: Once | INTRAMUSCULAR | Status: AC
  Administered 2017-10-05: 20 mg via INTRAVENOUS
  Filled 2017-10-04: qty 2

## 2017-10-04 MED ORDER — AZITHROMYCIN 500 MG PO TABS
250.0000 mg | ORAL_TABLET | Freq: Every day | ORAL | Status: DC
  Administered 2017-10-05: 250 mg via ORAL
  Filled 2017-10-04 (×2): qty 1

## 2017-10-04 MED ORDER — SODIUM CHLORIDE 0.9 % IV SOLN
500.0000 mg | Freq: Once | INTRAVENOUS | Status: AC
  Administered 2017-10-05: 500 mg via INTRAVENOUS
  Filled 2017-10-04: qty 500

## 2017-10-04 MED ORDER — TRAMADOL HCL 50 MG PO TABS: 50.0000 mg | ORAL_TABLET | Freq: Four times a day (QID) | ORAL | Status: DC | PRN

## 2017-10-04 MED ORDER — ISOSORBIDE MONONITRATE ER 30 MG PO TB24
30.0000 mg | ORAL_TABLET | Freq: Every day | ORAL | Status: DC
  Administered 2017-10-05 – 2017-10-08 (×4): 30 mg via ORAL
  Filled 2017-10-04 (×4): qty 1

## 2017-10-04 MED ORDER — ACETAMINOPHEN 325 MG PO TABS
650.0000 mg | ORAL_TABLET | Freq: Four times a day (QID) | ORAL | Status: DC | PRN
  Administered 2017-10-05: 650 mg via ORAL
  Filled 2017-10-04 (×2): qty 2

## 2017-10-04 MED ORDER — METOPROLOL TARTRATE 25 MG PO TABS
50.0000 mg | ORAL_TABLET | Freq: Two times a day (BID) | ORAL | Status: DC
  Administered 2017-10-05 – 2017-10-08 (×8): 50 mg via ORAL
  Filled 2017-10-04 (×8): qty 2

## 2017-10-04 MED ORDER — TRAMADOL HCL 50 MG PO TABS
50.0000 mg | ORAL_TABLET | Freq: Three times a day (TID) | ORAL | Status: DC | PRN
  Administered 2017-10-07: 50 mg via ORAL
  Filled 2017-10-04: qty 1

## 2017-10-04 MED ORDER — TIMOLOL MALEATE 0.5 % OP SOLN
1.0000 [drp] | Freq: Every morning | OPHTHALMIC | Status: DC
  Administered 2017-10-05 – 2017-10-08 (×4): 1 [drp] via OPHTHALMIC
  Filled 2017-10-04: qty 5

## 2017-10-04 MED ORDER — IPRATROPIUM BROMIDE 0.02 % IN SOLN
0.5000 mg | Freq: Four times a day (QID) | RESPIRATORY_TRACT | Status: DC
  Administered 2017-10-05 – 2017-10-06 (×4): 0.5 mg via RESPIRATORY_TRACT
  Filled 2017-10-04 (×4): qty 2.5

## 2017-10-04 MED ORDER — WARFARIN - PHYSICIAN DOSING INPATIENT: Freq: Every day | Status: DC

## 2017-10-04 MED ORDER — IPRATROPIUM BROMIDE 0.02 % IN SOLN: 0.5000 mg | Freq: Four times a day (QID) | RESPIRATORY_TRACT | Status: DC

## 2017-10-04 MED ORDER — PREDNISONE 20 MG PO TABS
40.0000 mg | ORAL_TABLET | Freq: Every day | ORAL | Status: DC
  Administered 2017-10-05 – 2017-10-08 (×4): 40 mg via ORAL
  Filled 2017-10-04 (×4): qty 2

## 2017-10-04 MED ORDER — GUAIFENESIN-DM 100-10 MG/5ML PO SYRP
5.0000 mL | ORAL_SOLUTION | ORAL | Status: DC | PRN
  Administered 2017-10-05 – 2017-10-08 (×2): 5 mL via ORAL
  Filled 2017-10-04 (×2): qty 5

## 2017-10-04 MED ORDER — LEVOTHYROXINE SODIUM 88 MCG PO TABS
88.0000 ug | ORAL_TABLET | Freq: Every day | ORAL | Status: DC
  Administered 2017-10-05 – 2017-10-08 (×4): 88 ug via ORAL
  Filled 2017-10-04 (×5): qty 1

## 2017-10-04 MED ORDER — POTASSIUM CITRATE ER 10 MEQ (1080 MG) PO TBCR
20.0000 meq | EXTENDED_RELEASE_TABLET | Freq: Once | ORAL | Status: AC
  Administered 2017-10-05: 20 meq via ORAL
  Filled 2017-10-04: qty 2

## 2017-10-04 MED ORDER — BRIMONIDINE TARTRATE 0.15 % OP SOLN
1.0000 [drp] | Freq: Three times a day (TID) | OPHTHALMIC | Status: DC
  Administered 2017-10-05 – 2017-10-08 (×11): 1 [drp] via OPHTHALMIC
  Filled 2017-10-04: qty 5

## 2017-10-04 MED ORDER — WARFARIN SODIUM 2.5 MG PO TABS
2.5000 mg | ORAL_TABLET | Freq: Every day | ORAL | Status: DC
  Administered 2017-10-05 (×2): 2.5 mg via ORAL
  Filled 2017-10-04 (×2): qty 1

## 2017-10-04 MED ORDER — IPRATROPIUM-ALBUTEROL 0.5-2.5 (3) MG/3ML IN SOLN
3.0000 mL | Freq: Once | RESPIRATORY_TRACT | Status: AC
  Administered 2017-10-04: 3 mL via RESPIRATORY_TRACT
  Filled 2017-10-04: qty 3

## 2017-10-04 MED ORDER — FUROSEMIDE 80 MG PO TABS
80.0000 mg | ORAL_TABLET | Freq: Every day | ORAL | Status: DC
  Administered 2017-10-05 – 2017-10-08 (×4): 80 mg via ORAL
  Filled 2017-10-04 (×4): qty 1

## 2017-10-04 NOTE — ED Notes (Signed)
Patient transported to X-ray 

## 2017-10-04 NOTE — H&P (Addendum)
History and Physical  Tonya Farley IRS:[id number] DOB: [birth year] DOA: 10/04/2017 1917  PCP: Lorene Dy, MD   Chief Complaint: generalized weakness and cough  HPI: Tonya Farley is a 84 y.o. female with chronic persistent afib (on coumadin), systolic CHF (EF 00-93%), CKD who presents with generalized weakness, wheezing and non-productive cough x 3 days in setting of recent kyphoplasty on 09/26/17 for thoracic vertebrae compression fracture. She has had episodes of generalized weakness since the surgery and was evaluated at [facility] ED on 09/29/17 for generalized weakness -- at that time she was discharged from ED same day given resolution of symptoms relatively benign workup. Her medical history is also notable for the following: HTN, hypothyroidism, and pulm HTN. Since being back at home starting around this past Thursday, she began having progressively worsening dry cough with wheezing x 3 days. Denies fevers or chills. No sick contacts. Earlier today after going to the bathroom she experienced generalized weakness in her upper extremities and torso. Has been having increased fatigue x 1-2 weeks. No loss of consciousness and no syncope/near syncope. Per family (niece), they assessed her strength in her arms and legs afterwards and did not notice any focal deficits or paresthesias. EMS activated by family. Patient has been compliant with her home coumadin which was restarted without issue after spine surgery.  Of note, patient lives alone and manages all ADL/IADLs herself. Family members include two nieces Narda Rutherford and Bethena Roys) who check in on her frequently.   EMS en route: HR 110-150; BP 134/70; SO2 93% on RA. Received 500cc NS x 1 ED course: T 99.5 F; HR 97-140 (average 100-110); BP 138/74; RR 28-32; SO2 92-96% on RA. Received duonebs x 1  Review of Systems: + dry cough, wheezing, episode of generalized weakness - no focal neuro deficits (had episode of generalized upper body weakness that  resolved) - no fevers/chills - no chest pain, dyspnea on exertion - no syncope or near syncope - no edema, PND, orthopnea - no nausea/vomiting; no tarry, melanotic or bloody stools - no dysuria, increased urinary frequency - no significant weight changes - no new rashes or lesions  Rest of systems reviewed are negative, except as per above history.   Past Medical History:  Diagnosis Date  . Chronic a-fib ([place])   . Chronic back pain   . Chronic diastolic (congestive) heart failure (HCC)    a. Echo 3/17: mild LVH, EF 55%, no RWMA, mod AS (mean 10 mmHg, peak 21 mmHg), MAC, mild MR, severe LAE, mod reduced RVSF, mild RAE, mod TR, PASP 65 mmHg  . CKD (chronic kidney disease) stage 4, GFR 15-29 ml/min (HCC) 09/11/2015  . Hyperlipidemia   . Hypertension   . Hypothyroidism   . Pulmonary hypertension (HCC)    PASP 65 mmHg at Echo 3/17  . Thyroid disease    Past Surgical History:  Procedure Laterality Date  . APPENDECTOMY    . cataracts     bilateral  . EYE SURGERY     left eye "hole"  . IR KYPHO THORACIC WITH BONE BIOPSY  09/26/2017  . IR RADIOLOGIST EVAL & MGMT  09/18/2017  . TONSILLECTOMY     Social History:  reports that she has never smoked. She has never used smokeless tobacco. She reports that she does not drink alcohol or use drugs.  Allergies  Allergen Reactions  . Penicillins Hives and Swelling    Has patient had a PCN reaction causing immediate rash, facial/tongue/throat swelling, SOB or  lightheadedness with hypotension: Yes Has patient had a PCN reaction causing severe rash involving mucus membranes or skin necrosis: Yes Has patient had a PCN reaction that required hospitalization No Has patient had a PCN reaction occurring within the last 10 years: No If all of the above answers are "NO", then may proceed with Cephalosporin use.    . Sulfa Antibiotics Anaphylaxis and Swelling  . Fish Allergy Nausea And Vomiting    Family History  Problem Relation Age of Onset  .  Heart disease Sister   . Heart disease Sister   . Cancer Mother   . Other Father   . Heart attack Father      Prior to Admission medications   Medication Sig Start Date End Date Taking? Authorizing Provider  acetaminophen (TYLENOL) 500 MG tablet Take 2 tablets (1,000 mg total) by mouth every 6 (six) hours as needed. Patient taking differently: Take 750 mg by mouth every 6 (six) hours as needed for mild pain.  09/17/17  Yes Davonna Belling, MD  allopurinol (ZYLOPRIM) 100 MG tablet Take 150 mg by mouth daily.    Yes [provider]  atorvastatin (LIPITOR) 10 MG tablet Take 10 mg by mouth daily.   Yes [provider]  B Complex Vitamins (B COMPLEX 1 PO) Take 1 tablet by mouth daily.   Yes [provider]  brimonidine (ALPHAGAN) 0.15 % ophthalmic solution Place 1 drop into both eyes 3 (three) times daily.   Yes [provider]  cholecalciferol (VITAMIN D) 400 units TABS tablet Take 400 Units by mouth daily.   Yes [provider]  diltiazem (CARDIZEM CD) 120 MG 24 hr capsule Take 1 capsule (120 mg total) by mouth daily. 08/26/17  Yes Jettie Booze, MD  feeding supplement (BOOST / RESOURCE BREEZE) LIQD Take 1 Container by mouth 2 (two) times daily between meals. Patient taking differently: Take 1 Container by mouth daily.  12/27/16  Yes Ghimire, Henreitta Leber, MD  furosemide (LASIX) 80 MG tablet TAKE 1 TABLET BY MOUTH EVERY DAY 04/28/17  Yes Jettie Booze, MD  isosorbide mononitrate (IMDUR) 30 MG 24 hr tablet Take 1 tablet (30 mg total) by mouth daily. 09/05/17  Yes Jettie Booze, MD  latanoprost (XALATAN) 0.005 % ophthalmic solution Place 1 drop into both eyes at bedtime. 01/10/14  Yes [provider]  levalbuterol Penne Lash) 0.63 MG/3ML nebulizer solution Take 3 mLs (0.63 mg total) by nebulization every 6 (six) hours as needed for wheezing or shortness of breath. 07/14/17 07/14/18 Yes Elgergawy, Silver Huguenin, MD  levothyroxine (SYNTHROID,  LEVOTHROID) 88 MCG tablet Take 88 mcg by mouth daily before breakfast.   Yes [provider]  metolazone (ZAROXOLYN) 2.5 MG tablet Take 1 tablet (2.5 mg total) by mouth as needed (take if weight goes up 3 lbs in 1 day or 5 lbs in 1 week). Patient taking differently: Take 2.5 mg by mouth as needed (take if weight goes up 3 lbs in 1 day or 5 lbs in 1 week or fluid).  10/27/15  Yes Weaver, Scott T, PA-C  metoprolol tartrate (LOPRESSOR) 100 MG tablet Take 1 tablet (100 mg total) by mouth 2 (two) times daily. 07/14/17  Yes Elgergawy, Silver Huguenin, MD  ondansetron (ZOFRAN ODT) 4 MG disintegrating tablet Take 1 tablet (4 mg total) by mouth every 4 (four) hours as needed for nausea or vomiting. 09/17/17  Yes Davonna Belling, MD  ondansetron (ZOFRAN-ODT) 4 MG disintegrating tablet Take 1 tablet (4 mg total) by mouth  every 8 (eight) hours as needed for nausea or vomiting. 09/17/16  Yes Davonna Belling, MD  Polyethyl Glycol-Propyl Glycol (SYSTANE OP) Apply 2 drops to eye daily as needed (dry eyes).    Yes [provider]  potassium chloride SA (K-DUR,KLOR-CON) 20 MEQ tablet Take 0.5 tablets (10 mEq total) by mouth daily. 07/14/17  Yes Elgergawy, Silver Huguenin, MD  timolol (TIMOPTIC) 0.5 % ophthalmic solution Place 1 drop into both eyes every morning.  12/28/13  Yes [provider]  traMADol (ULTRAM) 50 MG tablet Take 1 tablet (50 mg total) by mouth every 6 (six) hours as needed. 09/17/17  Yes Davonna Belling, MD  warfarin (COUMADIN) 1 MG tablet Take 2.5 tablets daily as directed by Coumadin clinic 09/17/17  Yes Jettie Booze, MD    Temp:  [99.5 F (37.5 C)] 99.5 F (37.5 C) (07/13 1944) Pulse Rate:  [78-97] 89 (07/13 2100) Cardiac Rhythm: Atrial fibrillation (07/13 1932) Resp:  [28-32] 28 (07/13 2100) BP: (122-138)/(74-90) 136/90 (07/13 2100) SpO2:  [92 %-95 %] 94 % (07/13 2100) Weight:  [59 kg (130 lb)] 59 kg (130 lb) (07/13 1931)  PHYSICAL EXAM:  BP 136/90   Pulse 89   Temp  99.5 F (37.5 C) (Axillary)   Resp (!) 28   Ht _0  (1.626 m)   Wt 59 kg (130 lb)   SpO2 94%   BMI 22.31 kg/m   GEN thin elderly caucasian female; resting comfortably in bed  HEENT NCAT EOM PERRL; L eye surgical changes; clear oropharynx, no cervical LAD; moist mucus membranes  JVP estimated 8 cm H2O above RA; + HJR ; no carotid bruits b/l ;  CV irregular varying from 90-110s tachycardia; normal S1; loud S2; no m/r/g or S3/S4; PMI non displaced; no parasternal heave  RESP coarse rhonchi throughout, diminished at bases b/l; faint monophonic end expiratory wheezing throughout; intermittent accessory mm use; able to speak full sentences; symmetric air movement ABD soft NT ND +normoactive BS  EXT warm throughout b/l; no peripheral edema b/l PULSES  DP and radials 2+ intact b/l  SKIN/MSK no rashes or lesions  NEURO/PSYCH AAOx4. Mildly hard of hearing but able to understand/conversate without issue. Wearing glasses.  No visual field deficit. Negative pronator drift b/l. Lower extremities motor strength 5/5. Upper extremities motor strength 5/5. Did not assess gait. Speech intact without dysarthria.    LABS ON ADMISSION:  Basic Metabolic Panel: Recent Labs  Lab 09/29/17 1913 10/04/17 1936  NA 142 137  K 4.2 3.4*  CL 103 100  CO2 28 27  GLUCOSE 109* 94  BUN 35* 24*  CREATININE 1.68* 1.55*  CALCIUM 9.2 8.6*   CBC: Recent Labs  Lab 09/29/17 1913 10/04/17 1936  WBC 11.3* 14.5*  NEUTROABS  --  11.3*  HGB 11.5* 10.6*  HCT 37.7 35.1*  MCV 90.0 89.8  PLT 234 196   Liver Function Tests: No results for input(s): AST, ALT, ALKPHOS, BILITOT, PROT, ALBUMIN in the last 168 hours. No results for input(s): LIPASE, AMYLASE in the last 168 hours. No results for input(s): AMMONIA in the last 168 hours. Coagulation:  Lab Results  Component Value Date   INR 2.50 10/04/2017   INR 1.35 09/26/2017   INR 1.71 09/24/2017   No results found for: PTT Lactic Acid, Venous:     Component  Value Date/Time   LATICACIDVEN 1.36 10/04/2017 2006   Cardiac Enzymes: No results for input(s): CKTOTAL, CKMB, CKMBINDEX, TROPONINI in the last 168 hours.  BNP (last 3 results)  No results for input(s): PROBNP in the last 8760 hours. CBG: Recent Labs  Lab 09/29/17 1749  GLUCAP 114*    Radiological Exams on Admission: Dg Chest 2 View  Result Date: 10/04/2017 CLINICAL DATA:  Generalized weakness, fatigue and shortness of breath increasing over the last week. History of atrial fibrillation. EXAM: CHEST - 2 VIEW COMPARISON:  09/29/2017 and 09/17/2017. FINDINGS: There is stable cardiomegaly and aortic atherosclerosis. There is chronic central airway thickening, left basilar atelectasis and small bilateral pleural effusions. No edema or confluent airspace opacity. Spinal augmentation changes noted at T7 and L1, without evidence of acute osseous abnormality. IMPRESSION: Overall similar radiographic appearance of the chest with cardiomegaly, chronic pleural effusions and left basilar atelectasis. No definite acute findings. Electronically Signed   By: Richardean Sale M.D.   On: 10/04/2017 20:14    EKG: Independently reviewed.   Assessment/Plan Tonya Farley is a 84 y.o. female with chronic persistent afib, systolic CHF (EF 63-87%), CKD who presents with generalized weakness, wheezing and non-productive cough x 3 days in setting of recent kyphoplasty on 09/26/17 for thoracic vertebrae compression fracture. She has had episodes of generalized weakness since the surgery and was evaluated at [facility] ED on 09/29/17 -- she was discharged from ED at that time given symptoms had resolved. Her medical history is also notable for the following: HTN, hypothyroidism, and pulm HTN. She appears non-toxic although initial resp exam is significant for wheezing and diffuse rhonchi. Suspect viral bronchitis although cannot rule out atypical pneumonia. Currently afebrile. Appears slightly volume up on neck exam but  JVP not significantly elevated and weight is near baseline at 130 lbs; thus do not suspect ADHF is causing majority symptoms. Will dose IV lasix x 1 tonight then resuming home PO lasix. Regarding suspected bronchitis, plan to treat with nebs, PO steroids and azithromycin. Resuming rest of home meds for afib and chronic systolic CHF as below.    Active Problems:   Viral bronchitis   # Mild generalized weakness with cough and wheezing -- suspect viral bronchitis vs atypical pneumonia. No focal neuro deficits to suggest stroke or spinal injury.  > CXR without acute abnormalities compared to prior although does show small b/l pleural effusion. Cannot rule out left lower slightly increased opacity on my read. Afebrile and hemodynamically stable on exam. O2 sats 92-96% on RA - nebs with ipratropium q6h - azithromycin x 5 days - prednisone 13m x 5 days - prn robitussin q4h for cough - resp viral panel ordered - incentive spirometer - low threshold for further infectious workup and non contrast CT chest if clinically does not improve - PT/OT evaluation - dietitian consult  # Longstanding persistent afib (on coumadin) with CHADS2VASC at least 5. On rate control (dilt and metop) at home.  - resume coumadin at 2.584mnightly (home dose) - check INR tomorrow AM - will not restart diltiazem given HFrEF (see below) - start metoprolol at lower dose 5036mID tonight; monitor for increased wheezing. Can increase to 100m64mD (home dose) on discharge - telemetry   # History of systolic CHF and mod-severe pulm HTN (chronic). Cannot rule out ICM given age and risk factors although longstanding afib and HTN can predispose to NICM.  > TTE 06/2017: EF 35-40% global LV hypokinesis (unchanged from 2018 echo); PA peak 86 mmHg. Slightly volume up on exam although weight appears to near baseline. True dry weight may be confounded by decreased muscle mass.   - lasix IV 20mg49m tonight -  resume lasix PO 32m starting  tomorrow - beta blocker as above - fluid restriction 2L and heart healthy diet - patient currently not on ace-inhibitor (consider starting as outpatient) - resuming home imdur 324mdaily - recommend discontinuing diltiazem on discharge given HFrEF  - may resume home atorvastatin after discharge - outpatient cardiology follow up. No need for inpatient ischemic evaluation at this time and even benefits of outpatient evaluation is uncertain given age   # CKD with Cr 1.5-1.8 (Cr 1.55 on admission) - monitor daily BMP  # Hypokalemia on admission   - 20 mEq K ordered x 1  # Hypothyroidism on synthroid - resume home synthroid  # Glaucoma b/l - resume home eye drops: latanoprost, timolol, brimonidine  # Recent kyphoplasty -- minimal back pain, no evidence of focal neuro deficits or lower extremity weakness.  - prn tylenol and tramadol    DVT Prophylaxis: on coumadin  Code Status:  DNR Family Communication: niece Janie at bedside   Disposition Plan: Admit to observation with telemetry. Pending PT/OT evaluation and clinical improvement may be potential discharge home 10/05/17.   Extended Emergency Contact Information Primary Emergency Contact: Darr,Janie  UnJohnnette Litterf [place]: [phone] Phone: [phone]: Niece Secondary Emergency Contact: LoMarin Robertstates of [place]: [phone]: Niece  Time spent: > 55 minutes  CaColbert EwingMD [facility] Pager [phone] 7PM-7AM, please contact night-coverage [email or web address] Password [email or web address]/13/2019, 9:46 PM

## 2017-10-04 NOTE — ED Triage Notes (Signed)
Pt arrives via ems with reports of generalized weakness, fatigue and sob increasing over the last week. Pt in afib with hx of same. HR 110-150, BP 134/70, 93% RA. Given 500cc NS en route.

## 2017-10-04 NOTE — ED Provider Notes (Signed)
[facility] EMERGENCY DEPARTMENT Provider Note   CSN: [id number] Arrival date & time: 10/04/17  0626     History   Chief Complaint Chief Complaint  Patient presents with  . Weakness    HPI Tonya Farley is a 84 y.o. female.  HPI   84 year old female with generalized weakness, coughing and wheezing.  She is status post kyphoplasty last week.  Her back pain has improved since then.  She has been increasingly weak over the past couple days though.  She has had a nonproductive cough and family has noticed wheezing at times.  Patient reports feeling very tired/weak.  She denies any acute neurological deficits though.  Past Medical History:  Diagnosis Date  . Chronic a-fib ([place])   . Chronic back pain   . Chronic diastolic (congestive) heart failure (HCC)    a. Echo 3/17: mild LVH, EF 55%, no RWMA, mod AS (mean 10 mmHg, peak 21 mmHg), MAC, mild MR, severe LAE, mod reduced RVSF, mild RAE, mod TR, PASP 65 mmHg  . CKD (chronic kidney disease) stage 4, GFR 15-29 ml/min (HCC) 09/11/2015  . Hyperlipidemia   . Hypertension   . Hypothyroidism   . Pulmonary hypertension (HCC)    PASP 65 mmHg at Echo 3/17  . Thyroid disease     Patient Active Problem List   Diagnosis Date Noted  . Viral bronchitis 10/04/2017  . Acute on chronic systolic heart failure ([place]) 07/09/2017  . Atrial fibrillation with RVR ([place]) 07/09/2017  . Hypothyroidism, adult 07/09/2017  . Pulmonary HTN ([place]) 07/09/2017  . Leukocytosis 07/09/2017  . Acute respiratory failure with hypoxia ([place]) 07/09/2017  . High blood pressure 07/09/2017  . Chronic systolic heart failure ([place])   . Acute on chronic right heart failure ([place]) 12/22/2016  . CKD (chronic kidney disease), stage III w/ renal artery stenosis 12/22/2016  . Cellulitis of lower extremity 12/22/2016  . Chronic a-fib ([place]) 12/22/2016  . Hypothyroidism 12/22/2016  . Acute diastolic heart failure ([place]) 12/22/2016  . Elevated troponin  12/22/2016  . Acute hypokalemia 12/22/2016  . HTN (hypertension) 12/22/2016  . Dyslipidemia 12/22/2016  . Pulmonary hypertension, moderate to severe ([place]) 12/22/2016  . Abnormal urinalysis 12/22/2016  . Hypertensive heart disease 05/08/2016  . Chronic diastolic heart failure ([place]) 11/28/2015  . Anticoagulated 11/28/2015  . CKD (chronic kidney disease) stage 3, GFR 30-59 ml/min (HCC) 09/11/2015  . CKD (chronic kidney disease) stage 4, GFR 15-29 ml/min (HCC) 09/11/2015  . Pulmonary hypertension ([place])   . Pleural effusion on left   . Peripheral edema   . Hypokalemia   . Anasarca 08/10/2015  . Acute on chronic respiratory failure with hypoxia ([place])   . Pleural effusion   . Physical deconditioning   . Cough   . HCAP (healthcare-associated pneumonia)   . Chest congestion   . SOB (shortness of breath)   . Swelling   . Acute on chronic diastolic heart failure ([place])   . HLD (hyperlipidemia)   . Influenza due to identified novel influenza A virus with other respiratory manifestations 06/07/2015  . Pleural effusion, bacterial   . Dyspnea   . AKI (acute kidney injury) ([place])   . CAP (community acquired pneumonia) 05/30/2015  . Renal artery stenosis ([place]) 08/08/2014  . Encounter for therapeutic drug monitoring 04/21/2013  . Hypertension 06/25/2012  . Hyperlipidemia 06/25/2012  . Atrial fibrillation ([place]) 06/25/2012  . Compression fracture of lumbar spine, non-traumatic ([place]) 06/25/2012  . Chronic back pain     Past Surgical  History:  Procedure Laterality Date  . APPENDECTOMY    . cataracts     bilateral  . EYE SURGERY     left eye "hole"  . IR KYPHO THORACIC WITH BONE BIOPSY  09/26/2017  . IR RADIOLOGIST EVAL & MGMT  09/18/2017  . TONSILLECTOMY       OB History   None      Home Medications    Prior to Admission medications   Medication Sig Start Date End Date Taking? Authorizing Provider  acetaminophen (TYLENOL) 500 MG tablet Take 2 tablets (1,000 mg total) by mouth every  6 (six) hours as needed. Patient taking differently: Take 750 mg by mouth every 6 (six) hours as needed for mild pain.  09/17/17  Yes Davonna Belling, MD  allopurinol (ZYLOPRIM) 100 MG tablet Take 150 mg by mouth daily.    Yes [provider]  atorvastatin (LIPITOR) 10 MG tablet Take 10 mg by mouth daily.   Yes [provider]  B Complex Vitamins (B COMPLEX 1 PO) Take 1 tablet by mouth daily.   Yes [provider]  brimonidine (ALPHAGAN) 0.15 % ophthalmic solution Place 1 drop into both eyes 3 (three) times daily.   Yes [provider]  cholecalciferol (VITAMIN D) 400 units TABS tablet Take 400 Units by mouth daily.   Yes [provider]  diltiazem (CARDIZEM CD) 120 MG 24 hr capsule Take 1 capsule (120 mg total) by mouth daily. 08/26/17  Yes Jettie Booze, MD  feeding supplement (BOOST / RESOURCE BREEZE) LIQD Take 1 Container by mouth 2 (two) times daily between meals. Patient taking differently: Take 1 Container by mouth daily.  12/27/16  Yes Ghimire, Henreitta Leber, MD  furosemide (LASIX) 80 MG tablet TAKE 1 TABLET BY MOUTH EVERY DAY 04/28/17  Yes Jettie Booze, MD  isosorbide mononitrate (IMDUR) 30 MG 24 hr tablet Take 1 tablet (30 mg total) by mouth daily. 09/05/17  Yes Jettie Booze, MD  latanoprost (XALATAN) 0.005 % ophthalmic solution Place 1 drop into both eyes at bedtime. 01/10/14  Yes [provider]  levalbuterol Penne Lash) 0.63 MG/3ML nebulizer solution Take 3 mLs (0.63 mg total) by nebulization every 6 (six) hours as needed for wheezing or shortness of breath. 07/14/17 07/14/18 Yes Elgergawy, Silver Huguenin, MD  levothyroxine (SYNTHROID, LEVOTHROID) 88 MCG tablet Take 88 mcg by mouth daily before breakfast.   Yes [provider]  metolazone (ZAROXOLYN) 2.5 MG tablet Take 1 tablet (2.5 mg total) by mouth as needed (take if weight goes up 3 lbs in 1 day or 5 lbs in 1 week). Patient taking differently: Take 2.5 mg by mouth as  needed (take if weight goes up 3 lbs in 1 day or 5 lbs in 1 week or fluid).  10/27/15  Yes Weaver, Scott T, PA-C  metoprolol tartrate (LOPRESSOR) 100 MG tablet Take 1 tablet (100 mg total) by mouth 2 (two) times daily. 07/14/17  Yes Elgergawy, Silver Huguenin, MD  ondansetron (ZOFRAN ODT) 4 MG disintegrating tablet Take 1 tablet (4 mg total) by mouth every 4 (four) hours as needed for nausea or vomiting. 09/17/17  Yes Davonna Belling, MD  ondansetron (ZOFRAN-ODT) 4 MG disintegrating tablet Take 1 tablet (4 mg total) by mouth every 8 (eight) hours as needed for nausea or vomiting. 09/17/16  Yes Davonna Belling, MD  Polyethyl Glycol-Propyl Glycol (SYSTANE OP) Apply 2 drops to eye daily as needed (dry eyes).    Yes [provider]  potassium chloride SA (K-DUR,KLOR-CON)  20 MEQ tablet Take 0.5 tablets (10 mEq total) by mouth daily. 07/14/17  Yes Elgergawy, Silver Huguenin, MD  timolol (TIMOPTIC) 0.5 % ophthalmic solution Place 1 drop into both eyes every morning.  12/28/13  Yes [provider]  traMADol (ULTRAM) 50 MG tablet Take 1 tablet (50 mg total) by mouth every 6 (six) hours as needed. 09/17/17  Yes Davonna Belling, MD  warfarin (COUMADIN) 1 MG tablet Take 2.5 tablets daily as directed by Coumadin clinic 09/17/17  Yes Jettie Booze, MD    Family History Family History  Problem Relation Age of Onset  . Heart disease Sister   . Heart disease Sister   . Cancer Mother   . Other Father   . Heart attack Father     Social History Social History   Tobacco Use  . Smoking status: Never Smoker  . Smokeless tobacco: Never Used  Substance Use Topics  . Alcohol use: No  . Drug use: No     Allergies   Penicillins; Sulfa antibiotics; and Fish allergy   Review of Systems Review of Systems  All systems reviewed and negative, other than as noted in HPI.  Physical Exam Updated Vital Signs BP 136/90   Pulse 61   Temp 99.5 F (37.5 C) (Axillary)   Resp (!) 25   Ht _0  (1.626 m)    Wt 59 kg (130 lb)   SpO2 96%   BMI 22.31 kg/m   Physical Exam  Constitutional: She appears well-developed and well-nourished. No distress.  HENT:  Head: Normocephalic and atraumatic.  Eyes: Conjunctivae are normal. Right eye exhibits no discharge. Left eye exhibits no discharge.  Neck: Neck supple.  Cardiovascular: Normal rate, regular rhythm and normal heart sounds. Exam reveals no gallop and no friction rub.  No murmur heard. Pulmonary/Chest: Effort normal. No respiratory distress. She has wheezes.  Mild tachypnea.  Some expiratory wheezing noted.  Abdominal: Soft. She exhibits no distension. There is no tenderness.  Musculoskeletal: She exhibits no edema or tenderness.  Neurological: She is alert. No cranial nerve deficit. She exhibits normal muscle tone. Coordination normal.  Skin: Skin is warm and dry.  Psychiatric: She has a normal mood and affect. Her behavior is normal. Thought content normal.  Nursing note and vitals reviewed.    ED Treatments / Results  Labs (all labs ordered are listed, but only abnormal results are displayed) Labs Reviewed  RESPIRATORY PANEL BY PCR - Abnormal; Notable for the following components:      Result Value   Rhinovirus / Enterovirus DETECTED (*)    All other components within normal limits  CBC WITH DIFFERENTIAL/PLATELET - Abnormal; Notable for the following components:   WBC 14.5 (*)    Hemoglobin 10.6 (*)    HCT 35.1 (*)    RDW 19.5 (*)    Neutro Abs 11.3 (*)    Monocytes Absolute 1.8 (*)    All other components within normal limits  BASIC METABOLIC PANEL - Abnormal; Notable for the following components:   Potassium 3.4 (*)    BUN 24 (*)    Creatinine, Ser 1.55 (*)    Calcium 8.6 (*)    GFR calc non Af Amer 27 (*)    GFR calc Af Amer 31 (*)    All other components within normal limits  URINALYSIS, ROUTINE W REFLEX MICROSCOPIC - Abnormal; Notable for the following components:   Color, Urine STRAW (*)    Specific Gravity,  Urine 1.004 (*)    Leukocytes,  UA MODERATE (*)    All other components within normal limits  PROTIME-INR - Abnormal; Notable for the following components:   Prothrombin Time 26.8 (*)    All other components within normal limits  BRAIN NATRIURETIC PEPTIDE - Abnormal; Notable for the following components:   B Natriuretic Peptide 1,201.7 (*)    All other components within normal limits  PROTIME-INR - Abnormal; Notable for the following components:   Prothrombin Time 27.4 (*)    All other components within normal limits  BASIC METABOLIC PANEL - Abnormal; Notable for the following components:   Potassium 6.2 (*)    Glucose, Bld 154 (*)    BUN 24 (*)    Creatinine, Ser 1.44 (*)    Calcium 8.6 (*)    GFR calc non Af Amer 30 (*)    GFR calc Af Amer 34 (*)    All other components within normal limits  CBC - Abnormal; Notable for the following components:   WBC 14.5 (*)    Hemoglobin 11.7 (*)    RDW 19.8 (*)    All other components within normal limits  BASIC METABOLIC PANEL - Abnormal; Notable for the following components:   Glucose, Bld 130 (*)    BUN 25 (*)    Creatinine, Ser 1.43 (*)    Calcium 8.8 (*)    GFR calc non Af Amer 30 (*)    GFR calc Af Amer 35 (*)    All other components within normal limits  CBC - Abnormal; Notable for the following components:   Hemoglobin 10.8 (*)    HCT 35.6 (*)    RDW 19.2 (*)    All other components within normal limits  BASIC METABOLIC PANEL - Abnormal; Notable for the following components:   Glucose, Bld 141 (*)    BUN 29 (*)    Creatinine, Ser 1.51 (*)    Calcium 8.8 (*)    GFR calc non Af Amer 28 (*)    GFR calc Af Amer 32 (*)    All other components within normal limits  PROTIME-INR - Abnormal; Notable for the following components:   Prothrombin Time 35.4 (*)    All other components within normal limits  HEMOGLOBIN A1C - Abnormal; Notable for the following components:   Hgb A1c MFr Bld 5.9 (*)    All other components within normal  limits  BASIC METABOLIC PANEL - Abnormal; Notable for the following components:   Glucose, Bld 134 (*)    BUN 42 (*)    Creatinine, Ser 1.73 (*)    GFR calc non Af Amer 24 (*)    GFR calc Af Amer 27 (*)    All other components within normal limits  PROTIME-INR - Abnormal; Notable for the following components:   Prothrombin Time 38.6 (*)    All other components within normal limits  PROTIME-INR - Abnormal; Notable for the following components:   Prothrombin Time 31.4 (*)    All other components within normal limits  CBC - Abnormal; Notable for the following components:   WBC 12.7 (*)    Hemoglobin 11.6 (*)    RDW 19.3 (*)    All other components within normal limits  BASIC METABOLIC PANEL - Abnormal; Notable for the following components:   Glucose, Bld 122 (*)    BUN 47 (*)    Creatinine, Ser 1.65 (*)    GFR calc non Af Amer 25 (*)    GFR calc Af Amer 29 (*)  All other components within normal limits  LDL CHOLESTEROL, DIRECT  I-STAT CG4 LACTIC ACID, ED    EKG EKG Interpretation  Date/Time:  Saturday October 04 2017 19:29:50 EDT Ventricular Rate:  114 PR Interval:    QRS Duration: 109 QT Interval:  346 QTC Calculation: 451 R Axis:   171 Text Interpretation:  Atrial fibrillation Ventricular premature complex Right axis deviation Repolarization abnormality, prob rate related change in axis since july 8 Confirmed by Merrily Pew [phone]) on 10/05/2017 2:59:57 PM   Radiology Dg Chest 2 View  Result Date: 10/04/2017 CLINICAL DATA:  Generalized weakness, fatigue and shortness of breath increasing over the last week. History of atrial fibrillation. EXAM: CHEST - 2 VIEW COMPARISON:  09/29/2017 and 09/17/2017. FINDINGS: There is stable cardiomegaly and aortic atherosclerosis. There is chronic central airway thickening, left basilar atelectasis and small bilateral pleural effusions. No edema or confluent airspace opacity. Spinal augmentation changes noted at T7 and L1, without  evidence of acute osseous abnormality. IMPRESSION: Overall similar radiographic appearance of the chest with cardiomegaly, chronic pleural effusions and left basilar atelectasis. No definite acute findings. Electronically Signed   By: Richardean Sale M.D.   On: 10/04/2017 20:14    Procedures Procedures (including critical care time)  Medications Ordered in ED Medications  brimonidine (ALPHAGAN) 0.15 % ophthalmic solution 1 drop (has no administration in time range)  diltiazem (CARDIZEM CD) 24 hr capsule 120 mg (has no administration in time range)  furosemide (LASIX) tablet 80 mg (has no administration in time range)  levothyroxine (SYNTHROID, LEVOTHROID) tablet 88 mcg (has no administration in time range)  timolol (TIMOPTIC) 0.5 % ophthalmic solution 1 drop (has no administration in time range)  warfarin (COUMADIN) tablet 2.5 mg (has no administration in time range)  azithromycin (ZITHROMAX) 500 mg in sodium chloride 0.9 % 250 mL IVPB (has no administration in time range)    Followed by  azithromycin (ZITHROMAX) tablet 250 mg (has no administration in time range)  predniSONE (DELTASONE) tablet 40 mg (has no administration in time range)  metoprolol tartrate (LOPRESSOR) tablet 50 mg (has no administration in time range)  traMADol (ULTRAM) tablet 50 mg (has no administration in time range)  acetaminophen (TYLENOL) tablet 650 mg (has no administration in time range)  ipratropium (ATROVENT) nebulizer solution 0.5 mg (has no administration in time range)  potassium citrate (UROCIT-K) SR tablet 20 mEq (has no administration in time range)  ipratropium-albuterol (DUONEB) 0.5-2.5 (3) MG/3ML nebulizer solution 3 mL (3 mLs Nebulization Given 10/04/17 2137)     Initial Impression / Assessment and Plan / ED Course  I have reviewed the triage vital signs and the nursing notes.  Pertinent labs & imaging results that were available during my care of the patient were reviewed by me and considered in my  medical decision making (see chart for details).    84 year old female with generalized weakness and a nonproductive cough.  Mild hypoxemia noted at rest.  Suspect she may have a viral bronchitis versus possibly radio occult pneumonia.  Deconditioning is also likely playing a significant role.  Recent kyphoplasty but reports her back pain is been improving.  She has no acute neurological complaints.  I feel like she will ultimately need placement.  Final Clinical Impressions(s) / ED Diagnoses   Final diagnoses:  Cerebral infarction due to embolism of right middle cerebral artery [facility])    ED Discharge Orders    None       Virgel Manifold, MD 10/13/17 2201

## 2017-10-05 DIAGNOSIS — E039 Hypothyroidism, unspecified: Secondary | ICD-10-CM | POA: Diagnosis present

## 2017-10-05 DIAGNOSIS — Z9089 Acquired absence of other organs: Secondary | ICD-10-CM | POA: Diagnosis not present

## 2017-10-05 DIAGNOSIS — Z66 Do not resuscitate: Secondary | ICD-10-CM | POA: Diagnosis present

## 2017-10-05 DIAGNOSIS — N183 Chronic kidney disease, stage 3 (moderate): Secondary | ICD-10-CM | POA: Diagnosis present

## 2017-10-05 DIAGNOSIS — I6523 Occlusion and stenosis of bilateral carotid arteries: Secondary | ICD-10-CM | POA: Diagnosis present

## 2017-10-05 DIAGNOSIS — Z9049 Acquired absence of other specified parts of digestive tract: Secondary | ICD-10-CM | POA: Diagnosis not present

## 2017-10-05 DIAGNOSIS — R531 Weakness: Secondary | ICD-10-CM | POA: Diagnosis present

## 2017-10-05 DIAGNOSIS — I071 Rheumatic tricuspid insufficiency: Secondary | ICD-10-CM | POA: Diagnosis present

## 2017-10-05 DIAGNOSIS — G9340 Encephalopathy, unspecified: Secondary | ICD-10-CM | POA: Diagnosis not present

## 2017-10-05 DIAGNOSIS — I361 Nonrheumatic tricuspid (valve) insufficiency: Secondary | ICD-10-CM | POA: Diagnosis not present

## 2017-10-05 DIAGNOSIS — E876 Hypokalemia: Secondary | ICD-10-CM | POA: Diagnosis present

## 2017-10-05 DIAGNOSIS — J209 Acute bronchitis, unspecified: Secondary | ICD-10-CM | POA: Diagnosis present

## 2017-10-05 DIAGNOSIS — J206 Acute bronchitis due to rhinovirus: Secondary | ICD-10-CM | POA: Diagnosis present

## 2017-10-05 DIAGNOSIS — I5022 Chronic systolic (congestive) heart failure: Secondary | ICD-10-CM | POA: Diagnosis present

## 2017-10-05 DIAGNOSIS — I63411 Cerebral infarction due to embolism of right middle cerebral artery: Secondary | ICD-10-CM | POA: Diagnosis not present

## 2017-10-05 DIAGNOSIS — H9190 Unspecified hearing loss, unspecified ear: Secondary | ICD-10-CM | POA: Diagnosis present

## 2017-10-05 DIAGNOSIS — I130 Hypertensive heart and chronic kidney disease with heart failure and stage 1 through stage 4 chronic kidney disease, or unspecified chronic kidney disease: Secondary | ICD-10-CM | POA: Diagnosis present

## 2017-10-05 DIAGNOSIS — H409 Unspecified glaucoma: Secondary | ICD-10-CM | POA: Diagnosis present

## 2017-10-05 DIAGNOSIS — E875 Hyperkalemia: Secondary | ICD-10-CM | POA: Diagnosis not present

## 2017-10-05 DIAGNOSIS — Z9842 Cataract extraction status, left eye: Secondary | ICD-10-CM | POA: Diagnosis not present

## 2017-10-05 DIAGNOSIS — L8990 Pressure ulcer of unspecified site, unspecified stage: Secondary | ICD-10-CM

## 2017-10-05 DIAGNOSIS — I2510 Atherosclerotic heart disease of native coronary artery without angina pectoris: Secondary | ICD-10-CM | POA: Diagnosis present

## 2017-10-05 DIAGNOSIS — I639 Cerebral infarction, unspecified: Secondary | ICD-10-CM | POA: Diagnosis not present

## 2017-10-05 DIAGNOSIS — M4854XD Collapsed vertebra, not elsewhere classified, thoracic region, subsequent encounter for fracture with routine healing: Secondary | ICD-10-CM | POA: Diagnosis present

## 2017-10-05 DIAGNOSIS — R000 Tachycardia, unspecified: Secondary | ICD-10-CM | POA: Diagnosis present

## 2017-10-05 DIAGNOSIS — I272 Pulmonary hypertension, unspecified: Secondary | ICD-10-CM | POA: Diagnosis present

## 2017-10-05 DIAGNOSIS — I481 Persistent atrial fibrillation: Secondary | ICD-10-CM | POA: Diagnosis not present

## 2017-10-05 DIAGNOSIS — E785 Hyperlipidemia, unspecified: Secondary | ICD-10-CM | POA: Diagnosis present

## 2017-10-05 DIAGNOSIS — Z9889 Other specified postprocedural states: Secondary | ICD-10-CM | POA: Diagnosis not present

## 2017-10-05 DIAGNOSIS — J208 Acute bronchitis due to other specified organisms: Secondary | ICD-10-CM | POA: Diagnosis not present

## 2017-10-05 LAB — CBC
HEMATOCRIT: 38.3 % (ref 36.0–46.0)
HEMOGLOBIN: 11.7 g/dL — AB (ref 12.0–15.0)
MCH: 27.2 pg (ref 26.0–34.0)
MCHC: 30.5 g/dL (ref 30.0–36.0)
MCV: 89.1 fL (ref 78.0–100.0)
Platelets: 189 10*3/uL (ref 150–400)
RBC: 4.3 MIL/uL (ref 3.87–5.11)
RDW: 19.8 % — ABNORMAL HIGH (ref 11.5–15.5)
WBC: 14.5 10*3/uL — ABNORMAL HIGH (ref 4.0–10.5)

## 2017-10-05 LAB — BASIC METABOLIC PANEL
ANION GAP: 11 (ref 5–15)
ANION GAP: 11 (ref 5–15)
BUN: 24 mg/dL — ABNORMAL HIGH (ref 8–23)
BUN: 25 mg/dL — ABNORMAL HIGH (ref 8–23)
CALCIUM: 8.6 mg/dL — AB (ref 8.9–10.3)
CO2: 25 mmol/L (ref 22–32)
CO2: 28 mmol/L (ref 22–32)
Calcium: 8.8 mg/dL — ABNORMAL LOW (ref 8.9–10.3)
Chloride: 100 mmol/L (ref 98–111)
Chloride: 100 mmol/L (ref 98–111)
Creatinine, Ser: 1.44 mg/dL — ABNORMAL HIGH (ref 0.44–1.00)
Creatinine, Ser: 1.43 mg/dL — ABNORMAL HIGH (ref 0.44–1.00)
GFR, EST AFRICAN AMERICAN: 34 mL/min — AB (ref >60)
GFR, EST AFRICAN AMERICAN: 35 mL/min — AB (ref >60)
GFR, EST NON AFRICAN AMERICAN: 30 mL/min — AB (ref >60)
GFR, EST NON AFRICAN AMERICAN: 30 mL/min — AB (ref >60)
Glucose, Bld: 154 mg/dL — ABNORMAL HIGH (ref 70–99)
Glucose, Bld: 130 mg/dL — ABNORMAL HIGH (ref 70–99)
POTASSIUM: 6.2 mmol/L — AB (ref 3.5–5.1)
Potassium: 4.5 mmol/L (ref 3.5–5.1)
SODIUM: 139 mmol/L (ref 135–145)
Sodium: 136 mmol/L (ref 135–145)

## 2017-10-05 LAB — RESPIRATORY PANEL BY PCR
ADENOVIRUS-RVPPCR: NOT DETECTED
Bordetella pertussis: NOT DETECTED
CHLAMYDOPHILA PNEUMONIAE-RVPPCR: NOT DETECTED
CORONAVIRUS 229E-RVPPCR: NOT DETECTED
CORONAVIRUS NL63-RVPPCR: NOT DETECTED
Coronavirus HKU1: NOT DETECTED
Coronavirus OC43: NOT DETECTED
INFLUENZA B-RVPPCR: NOT DETECTED
Influenza A: NOT DETECTED
MYCOPLASMA PNEUMONIAE-RVPPCR: NOT DETECTED
Metapneumovirus: NOT DETECTED
PARAINFLUENZA VIRUS 1-RVPPCR: NOT DETECTED
Parainfluenza Virus 2: NOT DETECTED
Parainfluenza Virus 3: NOT DETECTED
Parainfluenza Virus 4: NOT DETECTED
Respiratory Syncytial Virus: NOT DETECTED
Rhinovirus / Enterovirus: DETECTED — AB

## 2017-10-05 LAB — PROTIME-INR
INR: 2.58
PROTHROMBIN TIME: 27.4 s — AB (ref 11.4–15.2)

## 2017-10-05 MED ORDER — ATORVASTATIN CALCIUM 10 MG PO TABS
10.0000 mg | ORAL_TABLET | Freq: Every day | ORAL | Status: DC
  Administered 2017-10-05 – 2017-10-08 (×4): 10 mg via ORAL
  Filled 2017-10-05 (×4): qty 1

## 2017-10-05 MED ORDER — ALLOPURINOL 300 MG PO TABS
150.0000 mg | ORAL_TABLET | Freq: Every day | ORAL | Status: DC
Start: 2017-10-05 — End: 2017-10-08
  Administered 2017-10-05 – 2017-10-08 (×4): 150 mg via ORAL
  Filled 2017-10-05 (×4): qty 1

## 2017-10-05 NOTE — Plan of Care (Signed)
Acute rehab goals established. 

## 2017-10-05 NOTE — Progress Notes (Signed)
PROGRESS NOTE    Tonya Farley  WUJ:[id number] DOB: 01/20/1920 DOA: 10/04/2017 PCP: Burton Apleyoberts, Ronald, MD     Brief Narrative:  Tonya Farley is a 84 y.o. female with chronic persistent afib (on coumadin), systolic CHF (EF 95-62%35-40%), CKD who presents with generalized weakness, wheezing and non-productive cough x 3 days in setting of recent kyphoplasty on 09/26/17 for thoracic vertebrae compression fracture. She has had episodes of generalized weakness since the surgery and was [facility] for generalized weakness -- at that time she was discharged from ED same day given resolution of symptoms relatively benign workup. Her medical history is also notable for the following: HTN, hypothyroidism, and pulm HTN. Since being back at home starting around this past Thursday, she began having progressively worsening dry cough with wheezing x 3 days. Denies fevers or chills. No sick contacts. Earlier today after going to the bathroom she experienced generalized weakness in her upper extremities and torso. Has been having increased fatigue x 1-2 weeks. No loss of consciousness and no syncope/near syncope. Per family (niece), they assessed her strength in her arms and legs afterwards and did not notice any focal deficits or paresthesias. Of note, patient lives alone and manages all ADL/IADLs herself. Family members include two nieces Wille Celeste(Janie and Darel HongJudy) who check in on her frequently.   New events last 24 hours / Subjective: No new events.  Patient wondering she can go home.  She did have a productive cough last night.  Denies any worsening shortness of breath, chest pain, abdominal pain, nausea or vomiting today.  I discussed over the phone with patient's niece today.  She is requesting a neurology consult due to patient's intermittent confusion in the last week.  She has had 2 episodes in the last week regarding confusion as well as generalized weakness.  Assessment & Plan:   Principal  Problem:   Viral bronchitis Active Problems:   Hypertension   Atrial fibrillation (HCC)   Pressure injury of skin   Acute viral bronchitis Respiratory PCR positive for rhinovirus Chest x-ray without acute findings  Patient started on prednisone, azithromycin on admission  Generalized weakness PT OT evaluation, recommending home health physical therapy  Persistent atrial fibrillation CHADS2VASC 5 Continue Coumadin Started on metoprolol  Chronic systolic heart failure Echocardiogram in April 2019 with EF 35 to 40% Does not appear volume overloaded today Continue home Lasix  CAD Continue Imdur, atorvastatin   Chronic kidney disease stage 3  Baseline creatinine 1.5-1.8 Stable   Hypothyroidism Continue Synthroid  Recent kyphoplasty No acute change PT OT evaluation, recommending home health physical therapy  Intermittent confusion, generalized weakness No acute indication for inpatient neurology evaluation.  Patient's neurologic exam today was unremarkable, patient remained alert and oriented x3 without confusion on my examination.  We will obtain MRI to rule out stroke  Hyperkalemia Blood sample was hemolyzed, repeat BMP pending   DVT prophylaxis: Coumadin  Code Status: DNR Family Communication: Niece over the phone Disposition Plan: Pending MRI, likely discharge home 7/15   Consultants:   None  Procedures:   None   Antimicrobials:  Anti-infectives (From admission, onward)   Start     Dose/Rate Route Frequency Ordered Stop   10/05/17 1000  azithromycin (ZITHROMAX) tablet 250 mg     250 mg Oral Daily 10/04/17 2144 10/09/17 0959   10/04/17 2200  azithromycin (ZITHROMAX) 500 mg in sodium chloride 0.9 % 250 mL IVPB     500 mg 250 mL/hr over 60  Minutes Intravenous  Once 10/04/17 2144 10/05/17 0700        Objective: Vitals:   10/05/17 0500 10/05/17 0632 10/05/17 0808 10/05/17 0950  BP:  126/73 132/78   Pulse:  87 (!) 56 (!) 109  Resp:  18 18     Temp:  98.3 F (36.8 C) 98 F (36.7 C)   TempSrc:  Oral Oral   SpO2:  94% 95%   Weight: 59 kg (130 lb 1.1 oz)     Height:        Intake/Output Summary (Last 24 hours) at 10/05/2017 1329 Last data filed at 10/05/2017 0853 Gross per 24 hour  Intake 300 ml  Output 150 ml  Net 150 ml   Filed Weights   10/04/17 1931 10/05/17 0500  Weight: 59 kg (130 lb) 59 kg (130 lb 1.1 oz)    Examination:  General exam: Appears calm and comfortable  Respiratory system: Coarse breath sounds, no distress, no conversational dyspnea. Respiratory effort normal. Cardiovascular system: S1 & S2 heard, Irreg rhythm. No JVD, murmurs, rubs, gallops or clicks. No pedal edema. Gastrointestinal system: Abdomen is nondistended, soft and nontender. No organomegaly or masses felt. Normal bowel sounds heard. Central nervous system: Alert and oriented. No focal neurological deficits. Extremities: Symmetric 5 x 5 power. Skin: No rashes, lesions or ulcers Psychiatry: Judgement and insight appear normal. Mood & affect appropriate.   Data Reviewed: I have personally reviewed following labs and imaging studies  CBC: Recent Labs  Lab 09/29/17 1913 10/04/17 1936  WBC 11.3* 14.5*  NEUTROABS  --  11.3*  HGB 11.5* 10.6*  HCT 37.7 35.1*  MCV 90.0 89.8  PLT 234 196   Basic Metabolic Panel: Recent Labs  Lab 09/29/17 1913 10/04/17 1936 10/05/17 1022  NA 142 137 136  K 4.2 3.4* 6.2*  CL 103 100 100  CO2 28 27 25   GLUCOSE 109* 94 154*  BUN 35* 24* 24*  CREATININE 1.68* 1.55* 1.44*  CALCIUM 9.2 8.6* 8.6*   GFR: Estimated Creatinine Clearance: 19.7 mL/min (A) (by C-G formula based on SCr of 1.44 mg/dL (H)). Liver Function Tests: No results for input(s): AST, ALT, ALKPHOS, BILITOT, PROT, ALBUMIN in the last 168 hours. No results for input(s): LIPASE, AMYLASE in the last 168 hours. No results for input(s): AMMONIA in the last 168 hours. Coagulation Profile: Recent Labs  Lab 10/04/17 1936 10/05/17 0432   INR 2.50 2.58   Cardiac Enzymes: No results for input(s): CKTOTAL, CKMB, CKMBINDEX, TROPONINI in the last 168 hours. BNP (last 3 results) No results for input(s): PROBNP in the last 8760 hours. HbA1C: No results for input(s): HGBA1C in the last 72 hours. CBG: Recent Labs  Lab 09/29/17 1749  GLUCAP 114*   Lipid Profile: No results for input(s): CHOL, HDL, LDLCALC, TRIG, CHOLHDL, LDLDIRECT in the last 72 hours. Thyroid Function Tests: No results for input(s): TSH, T4TOTAL, FREET4, T3FREE, THYROIDAB in the last 72 hours. Anemia Panel: No results for input(s): VITAMINB12, FOLATE, FERRITIN, TIBC, IRON, RETICCTPCT in the last 72 hours. Sepsis Labs: Recent Labs  Lab 10/04/17 2006  LATICACIDVEN 1.36    Recent Results (from the past 240 hour(s))  Respiratory Panel by PCR     Status: Abnormal   Collection Time: 10/04/17 10:38 PM  Result Value Ref Range Status   Adenovirus NOT DETECTED NOT DETECTED Final   Coronavirus 229E NOT DETECTED NOT DETECTED Final   Coronavirus HKU1 NOT DETECTED NOT DETECTED Final   Coronavirus NL63 NOT DETECTED NOT DETECTED Final  Coronavirus OC43 NOT DETECTED NOT DETECTED Final   Metapneumovirus NOT DETECTED NOT DETECTED Final   Rhinovirus / Enterovirus DETECTED (A) NOT DETECTED Final   Influenza A NOT DETECTED NOT DETECTED Final   Influenza B NOT DETECTED NOT DETECTED Final   Parainfluenza Virus 1 NOT DETECTED NOT DETECTED Final   Parainfluenza Virus 2 NOT DETECTED NOT DETECTED Final   Parainfluenza Virus 3 NOT DETECTED NOT DETECTED Final   Parainfluenza Virus 4 NOT DETECTED NOT DETECTED Final   Respiratory Syncytial Virus NOT DETECTED NOT DETECTED Final   Bordetella pertussis NOT DETECTED NOT DETECTED Final   Chlamydophila pneumoniae NOT DETECTED NOT DETECTED Final   Mycoplasma pneumoniae NOT DETECTED NOT DETECTED Final    Comment: Performed at [facility], [address], [place], [place] [address]       Radiology Studies: Dg Chest 2  View  Result Date: 10/04/2017 CLINICAL DATA:  Generalized weakness, fatigue and shortness of breath increasing over the last week. History of atrial fibrillation. EXAM: CHEST - 2 VIEW COMPARISON:  09/29/2017 and 09/17/2017. FINDINGS: There is stable cardiomegaly and aortic atherosclerosis. There is chronic central airway thickening, left basilar atelectasis and small bilateral pleural effusions. No edema or confluent airspace opacity. Spinal augmentation changes noted at T7 and L1, without evidence of acute osseous abnormality. IMPRESSION: Overall similar radiographic appearance of the chest with cardiomegaly, chronic pleural effusions and left basilar atelectasis. No definite acute findings. Electronically Signed   By: Carey Bullocks M.D.   On: 10/04/2017 20:14      Scheduled Meds: . azithromycin  250 mg Oral Daily  . brimonidine  1 drop Both Eyes TID  . furosemide  80 mg Oral Daily  . ipratropium  0.5 mg Nebulization Q6H  . isosorbide mononitrate  30 mg Oral Daily  . levothyroxine  88 mcg Oral QAC breakfast  . metoprolol tartrate  50 mg Oral BID  . predniSONE  40 mg Oral Q breakfast  . timolol  1 drop Both Eyes q morning - 10a  . warfarin  2.5 mg Oral q1800  . Warfarin - Physician Dosing Inpatient   Does not apply q1800   Continuous Infusions:   LOS: 0 days    Time spent: 40 minutes   Noralee Stain, DO [facility] [email or web address] Password [email or web address] 10/05/2017, 1:29 PM

## 2017-10-05 NOTE — Evaluation (Signed)
Physical Therapy Evaluation Patient Details Name: Driscilla MoatsMamie N Scholer MRN: [id number] DOB: 12/24/1919 Today's Date: 10/05/2017   History of Present Illness   Cagney Garry Heater Messineo is a 84 y.o. female with chronic persistent afib, systolic CHF, CKD who presents with generalized weakness, wheezing and non-productive cough x 3 days in setting of recent kyphoplasty on 09/26/17 for thoracic vertebrae compression fracture.  Clinical Impression  Pt admitted with above complications. Pt currently with functional limitations due to the deficits listed below (see PT Problem List). She was previously independent with ADLs at home, alone using a rollator, with ability drive PRN. Today, patient required close guard for mobility and demonstrates rapid fatigue with moderate dyspnea although SpO2 remains 94-97% on room air, HR 110. She is arranging for family and friends to stay with her although I am not sure how much supervision they can provide at this time. Anticipate her physical strength will improve as she progresses medically but due to significant change in functional ability upon this evaluation, recommend HHPT follow-up for progressive strength/balance/endurance and home safety evaluation. Pt will benefit from skilled PT to increase their independence and safety with mobility acutely. Will follow until d/c.       Follow Up Recommendations Home health PT;Supervision/Assistance - 24 hour    Equipment Recommendations  None recommended by PT    Recommendations for Other Services OT consult     Precautions / Restrictions Precautions Precautions: Fall;Back Precaution Booklet Issued: (Kyphoplasty, reviewed) Precaution Comments: monitor O2 Restrictions Weight Bearing Restrictions: No      Mobility  Bed Mobility Overal bed mobility: Needs Assistance Bed Mobility: Rolling;Sidelying to Sit;Sit to Sidelying Rolling: Supervision Sidelying to sit: Supervision     Sit to sidelying: Supervision General bed  mobility comments: Supervision for safety, educated on log-roll technique. Quite effortful for patient to rise to EOB. Using rail for support, however did not require PT assist.  Transfers Overall transfer level: Needs assistance Equipment used: Rolling walker (2 wheeled) Transfers: Sit to/from Stand Sit to Stand: Min guard;From elevated surface         General transfer comment: Close guard for safety. Required bed to be elevated moderately, cues for hand placement, to prevent excesstive thoracic flexion due to recent kyphoplasty. No assist to boost but required several attempts by patient. Performed x2 from bed.  Ambulation/Gait Ambulation/Gait assistance: Min guard Gait Distance (Feet): 20 Feet Assistive device: Rolling walker (2 wheeled) Gait Pattern/deviations: Step-through pattern;Decreased stride length;Trunk flexed Gait velocity: slow Gait velocity interpretation: <1.31 ft/sec, indicative of household ambulator General Gait Details: Generally steady with the support of a rolling walker. She uses rollator at baseline. Slow without significant loss of balance noted. SpO2 94-96% on room air, however at the end of a 20 foot distance she was moderately dyspneic and need to sit 2/2 fatigue.  Stairs            Wheelchair Mobility    Modified Rankin (Stroke Patients Only)       Balance Overall balance assessment: Needs assistance Sitting-balance support: No upper extremity supported Sitting balance-Leahy Scale: Good     Standing balance support: No upper extremity supported Standing balance-Leahy Scale: Fair                               Pertinent Vitals/Pain Pain Assessment: No/denies pain    Home Living Family/patient expects to be discharged to:: Private residence Living Arrangements: Alone Available Help at Discharge: Friend(s);Available PRN/intermittently Type of  Home: House Home Access: Stairs to enter Entrance Stairs-Rails: Left Entrance  Stairs-Number of Steps: 2 Home Layout: One level Home Equipment: Walker - 2 wheels;Walker - 4 wheels;Cane - single point;Shower seat Additional Comments: Pt reports driving    Prior Function Level of Independence: Independent with assistive device(s)         Comments: uses rollator for mobility. Denies falls in the past year.     Hand Dominance   Dominant Hand: Right    Extremity/Trunk Assessment   Upper Extremity Assessment Upper Extremity Assessment: Defer to OT evaluation    Lower Extremity Assessment Lower Extremity Assessment: Generalized weakness       Communication   Communication: HOH  Cognition Arousal/Alertness: Awake/alert Behavior During Therapy: WFL for tasks assessed/performed Overall Cognitive Status: Within Functional Limits for tasks assessed                                        General Comments General comments (skin integrity, edema, etc.): SpO2 94-97% on room air during therapy session, HR 110    Exercises     Assessment/Plan    PT Assessment Patient needs continued PT services  PT Problem List Decreased strength;Decreased activity tolerance;Decreased balance;Decreased mobility;Decreased range of motion;Decreased knowledge of use of DME;Decreased knowledge of precautions;Cardiopulmonary status limiting activity       PT Treatment Interventions DME instruction;Gait training;Stair training;Functional mobility training;Therapeutic activities;Therapeutic exercise;Neuromuscular re-education;Balance training;Patient/family education    PT Goals (Current goals can be found in the Care Plan section)  Acute Rehab PT Goals Patient Stated Goal: Go home soon PT Goal Formulation: With patient Time For Goal Achievement: 10/19/17 Potential to Achieve Goals: Good    Frequency Min 3X/week   Barriers to discharge Decreased caregiver support lives alone, but is arranging daily support from family and friends.    Co-evaluation                AM-PAC PT "6 Clicks" Daily Activity  Outcome Measure Difficulty turning over in bed (including adjusting bedclothes, sheets and blankets)?: A Little Difficulty moving from lying on back to sitting on the side of the bed? : A Lot Difficulty sitting down on and standing up from a chair with arms (e.g., wheelchair, bedside commode, etc,.)?: A Little Help needed moving to and from a bed to chair (including a wheelchair)?: None Help needed walking in hospital room?: None Help needed climbing 3-5 steps with a railing? : A Little 6 Click Score: 19    End of Session Equipment Utilized During Treatment: Gait belt Activity Tolerance: Patient limited by fatigue Patient left: in bed;with call bell/phone within reach;with bed alarm set;with nursing/sitter in room Nurse Communication: Mobility status(NT) PT Visit Diagnosis: Difficulty in walking, not elsewhere classified (R26.2);Muscle weakness (generalized) (M62.81)    Time: 1610-9604 PT Time Calculation (min) (ACUTE ONLY): 28 min   Charges:   PT Evaluation $PT Eval Moderate Complexity: 1 Mod PT Treatments $Gait Training: 8-22 mins   PT G Codes:        Charlsie Merles, PT, DPT   [place] Mount 10/05/2017, 11:35 AM

## 2017-10-06 ENCOUNTER — Inpatient Hospital Stay (HOSPITAL_COMMUNITY): Payer: Medicare Other

## 2017-10-06 ENCOUNTER — Encounter (HOSPITAL_COMMUNITY): Payer: Self-pay

## 2017-10-06 DIAGNOSIS — I639 Cerebral infarction, unspecified: Secondary | ICD-10-CM

## 2017-10-06 LAB — BASIC METABOLIC PANEL
Anion gap: 11 (ref 5–15)
BUN: 29 mg/dL — ABNORMAL HIGH (ref 8–23)
CHLORIDE: 103 mmol/L (ref 98–111)
CO2: 28 mmol/L (ref 22–32)
Calcium: 8.8 mg/dL — ABNORMAL LOW (ref 8.9–10.3)
Creatinine, Ser: 1.51 mg/dL — ABNORMAL HIGH (ref 0.44–1.00)
GFR calc non Af Amer: 28 mL/min — ABNORMAL LOW (ref >60)
GFR, EST AFRICAN AMERICAN: 32 mL/min — AB (ref >60)
Glucose, Bld: 141 mg/dL — ABNORMAL HIGH (ref 70–99)
POTASSIUM: 3.9 mmol/L (ref 3.5–5.1)
SODIUM: 142 mmol/L (ref 135–145)

## 2017-10-06 LAB — CBC
HCT: 35.6 % — ABNORMAL LOW (ref 36.0–46.0)
HEMOGLOBIN: 10.8 g/dL — AB (ref 12.0–15.0)
MCH: 27.1 pg (ref 26.0–34.0)
MCHC: 30.3 g/dL (ref 30.0–36.0)
MCV: 89.2 fL (ref 78.0–100.0)
Platelets: 197 10*3/uL (ref 150–400)
RBC: 3.99 MIL/uL (ref 3.87–5.11)
RDW: 19.2 % — ABNORMAL HIGH (ref 11.5–15.5)
WBC: 9.2 10*3/uL (ref 4.0–10.5)

## 2017-10-06 LAB — HEMOGLOBIN A1C
HEMOGLOBIN A1C: 5.9 % — AB (ref 4.8–5.6)
MEAN PLASMA GLUCOSE: 122.63 mg/dL

## 2017-10-06 LAB — PROTIME-INR
INR: 3.56
PROTHROMBIN TIME: 35.4 s — AB (ref 11.4–15.2)

## 2017-10-06 MED ORDER — ENSURE ENLIVE PO LIQD
237.0000 mL | Freq: Every day | ORAL | Status: DC
  Administered 2017-10-07: 237 mL via ORAL

## 2017-10-06 MED ORDER — WARFARIN SODIUM 2.5 MG PO TABS: 2.5000 mg | ORAL_TABLET | Freq: Every day | ORAL | Status: DC

## 2017-10-06 MED ORDER — ADULT MULTIVITAMIN W/MINERALS CH
1.0000 | ORAL_TABLET | Freq: Every day | ORAL | Status: DC
  Administered 2017-10-06 – 2017-10-08 (×3): 1 via ORAL
  Filled 2017-10-06 (×3): qty 1

## 2017-10-06 MED ORDER — ALBUTEROL SULFATE (2.5 MG/3ML) 0.083% IN NEBU
2.5000 mg | INHALATION_SOLUTION | RESPIRATORY_TRACT | Status: DC | PRN
  Administered 2017-10-07 – 2017-10-08 (×2): 2.5 mg via RESPIRATORY_TRACT
  Filled 2017-10-06 (×2): qty 3

## 2017-10-06 MED ORDER — IPRATROPIUM BROMIDE 0.02 % IN SOLN
0.5000 mg | Freq: Two times a day (BID) | RESPIRATORY_TRACT | Status: DC
  Administered 2017-10-06 – 2017-10-07 (×2): 0.5 mg via RESPIRATORY_TRACT
  Filled 2017-10-06 (×3): qty 2.5

## 2017-10-06 NOTE — Progress Notes (Addendum)
PROGRESS NOTE    Tonya Farley  ZOX:[id number] DOB: 08/13/1919 DOA: 10/04/2017 PCP: Burton Apleyoberts, Ronald, MD     Brief Narrative:  Tonya MoatsMamie N Sussman is a 84 y.o. female with chronic persistent afib (on coumadin), systolic CHF (EF 81-19%35-40%), CKD who presents with generalized weakness, wheezing and non-productive cough x 3 days in setting of recent kyphoplasty on 09/26/17 for thoracic vertebrae compression fracture. She has had episodes of generalized weakness since the surgery and [facility] for generalized weakness -- at that time she was discharged from ED same day given resolution of symptoms relatively benign workup. Her medical history is also notable for the following: HTN, hypothyroidism, and pulm HTN. Since being back at home starting around this past Thursday, she began having progressively worsening dry cough with wheezing x 3 days. Denies fevers or chills. No sick contacts. Earlier today after going to the bathroom she experienced generalized weakness in her upper extremities and torso. Has been having increased fatigue x 1-2 weeks. No loss of consciousness and no syncope/near syncope. Per family (niece), they assessed her strength in her arms and legs afterwards and did not notice any focal deficits or paresthesias. Of note, patient lives alone and manages all ADL/IADLs herself. Family members include two nieces who check in on her frequently. Niece also noted patient's transient episodes of confusion on 2 occasions in the past 2 weeks, which is very unusual for patient.   New events last 24 hours / Subjective: Feeling well, states her breathing has improved. No longer coughing.   When asked about her transient confusion episodes, she does recall these episodes. She states that on one occasion, she was sitting at her niece's home and thought people were coming over to take a shower; her niece noticed this confusion and eventually patient's mentation resolved to  baseline.   Assessment & Plan:   Principal Problem:   Viral bronchitis Active Problems:   Hypertension   Atrial fibrillation (HCC)   Pressure injury of skin   Acute bronchitis   Acute viral bronchitis Respiratory PCR positive for rhinovirus Chest x-ray without acute findings  Patient continues to have wheezes bilaterally, will continue prednisone    Subacute left frontal infarct Consult neurology   Generalized weakness PT OT evaluation, recommending home health physical therapy  Persistent atrial fibrillation CHADS2VASC 5 Continue Coumadin Started on metoprolol  Chronic systolic heart failure Echocardiogram in April 2019 with EF 35 to 40% Without acute exacerbation  Continue home Lasix  CAD Continue Imdur, atorvastatin   Chronic kidney disease stage 3  Baseline creatinine 1.5-1.8 Stable   Hypothyroidism Continue Synthroid  Recent kyphoplasty No acute change PT OT evaluation, recommending home health physical therapy   DVT prophylaxis: Coumadin  Code Status: DNR Family Communication: No family at bedside, updated niece over the phone  Disposition Plan: Pending Neurology evaluation    Consultants:   Neurology   Procedures:   None   Antimicrobials:  Anti-infectives (From admission, onward)   Start     Dose/Rate Route Frequency Ordered Stop   10/05/17 1000  azithromycin (ZITHROMAX) tablet 250 mg     250 mg Oral Daily 10/04/17 2144 10/09/17 0959   10/04/17 2200  azithromycin (ZITHROMAX) 500 mg in sodium chloride 0.9 % 250 mL IVPB     500 mg 250 mL/hr over 60 Minutes Intravenous  Once 10/04/17 2144 10/05/17 0700       Objective: Vitals:   10/05/17 1655 10/05/17 1930 10/06/17 0430 10/06/17 0500  BP: (!) 141/85 129/86 140/83   Pulse: 82 (!) 102 96   Resp: 17 15 16    Temp: 97.7 F (36.5 C) 97.7 F (36.5 C) 97.8 F (36.6 C)   TempSrc: Oral Oral Oral   SpO2: 96% 95% 96%   Weight:    59 kg (130 lb 1.1 oz)  Height:        Intake/Output  Summary (Last 24 hours) at 10/06/2017 0911 Last data filed at 10/06/2017 0848 Gross per 24 hour  Intake 420 ml  Output 650 ml  Net -230 ml   Filed Weights   10/04/17 1931 10/05/17 0500 10/06/17 0500  Weight: 59 kg (130 lb) 59 kg (130 lb 1.1 oz) 59 kg (130 lb 1.1 oz)    Examination: General exam: Appears calm and comfortable  Respiratory system: Bilateral wheezes, no respiratory distress  Cardiovascular system: S1 & S2 heard, Irregular rhythm, rate controlled. No JVD, murmurs, rubs, gallops or clicks. No pedal edema. Gastrointestinal system: Abdomen is nondistended, soft and nontender. No organomegaly or masses felt. Normal bowel sounds heard. Central nervous system: Alert and oriented. No focal neurological deficits. CN 2-12 grossly in tact, strength equal and symmetric all extremities  Extremities: Symmetric 5 x 5 power. Skin: No rashes, lesions or ulcers Psychiatry: Judgement and insight appear normal. Mood & affect appropriate.    Data Reviewed: I have personally reviewed following labs and imaging studies  CBC: Recent Labs  Lab 09/29/17 1913 10/04/17 1936 10/05/17 1328 10/06/17 0421  WBC 11.3* 14.5* 14.5* 9.2  NEUTROABS  --  11.3*  --   --   HGB 11.5* 10.6* 11.7* 10.8*  HCT 37.7 35.1* 38.3 35.6*  MCV 90.0 89.8 89.1 89.2  PLT 234 196 189 197   Basic Metabolic Panel: Recent Labs  Lab 09/29/17 1913 10/04/17 1936 10/05/17 1022 10/05/17 1328 10/06/17 0421  NA 142 137 136 139 142  K 4.2 3.4* 6.2* 4.5 3.9  CL 103 100 100 100 103  CO2 28 27 25 28 28   GLUCOSE 109* 94 154* 130* 141*  BUN 35* 24* 24* 25* 29*  CREATININE 1.68* 1.55* 1.44* 1.43* 1.51*  CALCIUM 9.2 8.6* 8.6* 8.8* 8.8*   GFR: Estimated Creatinine Clearance: 18.8 mL/min (A) (by C-G formula based on SCr of 1.51 mg/dL (H)). Liver Function Tests: No results for input(s): AST, ALT, ALKPHOS, BILITOT, PROT, ALBUMIN in the last 168 hours. No results for input(s): LIPASE, AMYLASE in the last 168 hours. No  results for input(s): AMMONIA in the last 168 hours. Coagulation Profile: Recent Labs  Lab 10/04/17 1936 10/05/17 0432 10/06/17 0421  INR 2.50 2.58 3.56   Cardiac Enzymes: No results for input(s): CKTOTAL, CKMB, CKMBINDEX, TROPONINI in the last 168 hours. BNP (last 3 results) No results for input(s): PROBNP in the last 8760 hours. HbA1C: No results for input(s): HGBA1C in the last 72 hours. CBG: Recent Labs  Lab 09/29/17 1749  GLUCAP 114*   Lipid Profile: No results for input(s): CHOL, HDL, LDLCALC, TRIG, CHOLHDL, LDLDIRECT in the last 72 hours. Thyroid Function Tests: No results for input(s): TSH, T4TOTAL, FREET4, T3FREE, THYROIDAB in the last 72 hours. Anemia Panel: No results for input(s): VITAMINB12, FOLATE, FERRITIN, TIBC, IRON, RETICCTPCT in the last 72 hours. Sepsis Labs: Recent Labs  Lab 10/04/17 2006  LATICACIDVEN 1.36    Recent Results (from the past 240 hour(s))  Respiratory Panel by PCR     Status: Abnormal   Collection Time: 10/04/17 10:38 PM  Result Value Ref Range Status  Adenovirus NOT DETECTED NOT DETECTED Final   Coronavirus 229E NOT DETECTED NOT DETECTED Final   Coronavirus HKU1 NOT DETECTED NOT DETECTED Final   Coronavirus NL63 NOT DETECTED NOT DETECTED Final   Coronavirus OC43 NOT DETECTED NOT DETECTED Final   Metapneumovirus NOT DETECTED NOT DETECTED Final   Rhinovirus / Enterovirus DETECTED (A) NOT DETECTED Final   Influenza A NOT DETECTED NOT DETECTED Final   Influenza B NOT DETECTED NOT DETECTED Final   Parainfluenza Virus 1 NOT DETECTED NOT DETECTED Final   Parainfluenza Virus 2 NOT DETECTED NOT DETECTED Final   Parainfluenza Virus 3 NOT DETECTED NOT DETECTED Final   Parainfluenza Virus 4 NOT DETECTED NOT DETECTED Final   Respiratory Syncytial Virus NOT DETECTED NOT DETECTED Final   Bordetella pertussis NOT DETECTED NOT DETECTED Final   Chlamydophila pneumoniae NOT DETECTED NOT DETECTED Final   Mycoplasma pneumoniae NOT DETECTED NOT  DETECTED Final    Comment: Performed at [facility], [address], [place], [place] [address]       Radiology Studies: Dg Chest 2 View  Result Date: 10/04/2017 CLINICAL DATA:  Generalized weakness, fatigue and shortness of breath increasing over the last week. History of atrial fibrillation. EXAM: CHEST - 2 VIEW COMPARISON:  09/29/2017 and 09/17/2017. FINDINGS: There is stable cardiomegaly and aortic atherosclerosis. There is chronic central airway thickening, left basilar atelectasis and small bilateral pleural effusions. No edema or confluent airspace opacity. Spinal augmentation changes noted at T7 and L1, without evidence of acute osseous abnormality. IMPRESSION: Overall similar radiographic appearance of the chest with cardiomegaly, chronic pleural effusions and left basilar atelectasis. No definite acute findings. Electronically Signed   By: Carey Bullocks M.D.   On: 10/04/2017 20:14   Mr Brain Wo Contrast  Result Date: 10/06/2017 CLINICAL DATA:  Encephalopathy EXAM: MRI HEAD WITHOUT CONTRAST TECHNIQUE: Multiplanar, multiecho pulse sequences of the brain and surrounding structures were obtained without intravenous contrast. COMPARISON:  Head CT 07/10/2017 FINDINGS: BRAIN: There is a single subcentimeter focus of hyperintensity on diffusion-weighted imaging within the left frontal white matter. No corresponding ADC abnormality. No acute hemorrhage. The midline structures are normal. There are no old infarcts. The white matter signal is normal for the patient's age. Generalized atrophy, but not greater than expected for age. Susceptibility-sensitive sequences show no chronic microhemorrhage or superficial siderosis. VASCULAR: Major intracranial arterial and venous sinus flow voids are preserved. SKULL AND UPPER CERVICAL SPINE: The visualized skull base, calvarium, upper cervical spine and extracranial soft tissues are normal. SINUSES/ORBITS: No fluid levels or advanced mucosal thickening.  No mastoid or middle ear effusion. The orbits are normal. IMPRESSION: 1. Single focus of hyperintensity on diffusion-weighted imaging within the left frontal white matter is likely a early subacute infarct. No associated hemorrhage or mass effect. 2. Otherwise normal MRI of the aging brain. Electronically Signed   By: Deatra Robinson M.D.   On: 10/06/2017 03:57      Scheduled Meds: . allopurinol  150 mg Oral Daily  . atorvastatin  10 mg Oral Daily  . azithromycin  250 mg Oral Daily  . brimonidine  1 drop Both Eyes TID  . furosemide  80 mg Oral Daily  . ipratropium  0.5 mg Nebulization Q6H  . isosorbide mononitrate  30 mg Oral Daily  . levothyroxine  88 mcg Oral QAC breakfast  . metoprolol tartrate  50 mg Oral BID  . predniSONE  40 mg Oral Q breakfast  . timolol  1 drop Both Eyes q morning - 10a  .  warfarin  2.5 mg Oral q1800  . Warfarin - Physician Dosing Inpatient   Does not apply q1800   Continuous Infusions:   LOS: 1 day    Time spent: 40 minutes   Noralee Stain, DO [facility] [email or web address] Password [email or web address] 10/06/2017, 9:11 AM

## 2017-10-06 NOTE — Consult Note (Addendum)
Requesting Physician: Dr. Alvino Chapel    Chief Complaint: Stroke  History obtained from:  Patient   Chart    HPI:                                                                                                                                         Tonya Farley is an 84 y.o. female with hyperlipidemia, hypertension, chronic A. fib who is on Coumadin, systolic congestive heart failure with an EF of 35-40%, chronic kidney disease who initially presented for generalized weakness.  She been having increased fatigue for 1 to 2 weeks prior to hospitalization.  Her knee she had also been having some transient episodes of confusion--such as thinking people were coming over to take a shower.  Eventually patient's mentation resolved to baseline.  When talking the patient she does state that she awoke while she was at the hospital but believe she was at the house.  She looked around and apparently look like there was multiple things that were disheveled.  She became upset and tried to get out of her bed but then realized that she was hooked up to the IVs.  Present time she is back to her baseline she is aware that she is in the hospital, knows the month, knows the year and is able to follow all commands.  Of note patient has been having chronic back pain.  Patient underwent lumbar 1 and lumbar 4 kyphoplasty's on 06/30/2012 with Dr. Orlene Erm.  She also had 7 compression fractures that were noted on x-ray.  Patient really underwent a kyphoplasty in which her Coumadin was held.  Coumadin was restarted on 7/13.   Due to patient's confusion and encephalopathy a MRI was obtained today which revealed a single focus of hyperintensity located in the left frontal white matter likely a early subacute infarct.  Otherwise she had a normal MRI of the aging brain.  This reason neurology was consulted  Date last known well: Unable to determine Time last known well: Unable to determine tPA Given: No: No known time of acute infarct  and patient is on Coumadin with a recent INR of 3.56 Modified Rankin: Rankin Score=0    Past Medical History:  Diagnosis Date  . Chronic a-fib (HCC)   . Chronic back pain   . Chronic systolic (congestive) heart failure (HCC)    reduced EF since 2018 to 35-40%  . CKD (chronic kidney disease) stage 4, GFR 15-29 ml/min (HCC) 09/11/2015  . Hyperlipidemia   . Hypertension   . Hypothyroidism   . Pulmonary hypertension (HCC)    PASP 65 mmHg at Echo 3/17  . Thyroid disease     Past Surgical History:  Procedure Laterality Date  . APPENDECTOMY    . cataracts     bilateral  . EYE SURGERY     left eye "hole"  . IR KYPHO THORACIC WITH BONE BIOPSY  09/26/2017  . IR RADIOLOGIST EVAL & MGMT  09/18/2017  . TONSILLECTOMY      Family History  Problem Relation Age of Onset  . Heart disease Sister   . Heart disease Sister   . Cancer Mother   . Other Father   . Heart attack Father          Social History:  reports that she has never smoked. She has never used smokeless tobacco. She reports that she does not drink alcohol or use drugs.  Allergies:  Allergies  Allergen Reactions  . Penicillins Hives and Swelling    Has patient had a PCN reaction causing immediate rash, facial/tongue/throat swelling, SOB or lightheadedness with hypotension: Yes Has patient had a PCN reaction causing severe rash involving mucus membranes or skin necrosis: Yes Has patient had a PCN reaction that required hospitalization No Has patient had a PCN reaction occurring within the last 10 years: No If all of the above answers are "NO", then may proceed with Cephalosporin use.    . Sulfa Antibiotics Anaphylaxis and Swelling  . Fish Allergy Nausea And Vomiting    Medications:                                                                                                                           Prior to Admission:  Medications Prior to Admission  Medication Sig Dispense Refill Last Dose  . acetaminophen  (TYLENOL) 500 MG tablet Take 2 tablets (1,000 mg total) by mouth every 6 (six) hours as needed. (Patient taking differently: Take 500 mg by mouth every 6 (six) hours as needed (pain). ) 30 tablet 0 10/04/2017 at am  . allopurinol (ZYLOPRIM) 100 MG tablet Take 150 mg by mouth daily.    10/04/2017 at Unknown time  . atorvastatin (LIPITOR) 10 MG tablet Take 10 mg by mouth daily.   10/04/2017 at Unknown time  . B Complex Vitamins (VITAMIN B COMPLEX) TABS Take 1 tablet by mouth daily with lunch.   10/04/2017 at noon  . brimonidine (ALPHAGAN) 0.15 % ophthalmic solution Place 1 drop into both eyes 3 (three) times daily.   10/04/2017 at Unknown time  . cholecalciferol (VITAMIN D) 400 units TABS tablet Take 400 Units by mouth daily.   10/04/2017 at am  . diltiazem (CARDIZEM CD) 120 MG 24 hr capsule Take 1 capsule (120 mg total) by mouth daily. 90 capsule 1 10/04/2017 at Unknown time  . feeding supplement (BOOST / RESOURCE BREEZE) LIQD Take 1 Container by mouth 2 (two) times daily between meals. (Patient taking differently: Take 1 Container by mouth daily. ) 60 Container 0 10/04/2017 at Unknown time  . furosemide (LASIX) 80 MG tablet TAKE 1 TABLET BY MOUTH EVERY DAY 90 tablet 2 10/04/2017 at Unknown time  . isosorbide mononitrate (IMDUR) 30 MG 24 hr tablet Take 1 tablet (30 mg total) by mouth daily. 90 tablet 1 10/04/2017 at Unknown time  . latanoprost (XALATAN) 0.005 % ophthalmic solution Place 1  drop into both eyes at bedtime.  3 10/03/2017 at pm  . levothyroxine (SYNTHROID, LEVOTHROID) 88 MCG tablet Take 88 mcg by mouth daily before breakfast.   10/04/2017 at am  . metolazone (ZAROXOLYN) 2.5 MG tablet Take 1 tablet (2.5 mg total) by mouth as needed (take if weight goes up 3 lbs in 1 day or 5 lbs in 1 week). (Patient taking differently: Take 2.5 mg by mouth as needed (take if weight goes up 3 lbs in 1 day or 5 lbs in 1 week or fluid). ) 20 tablet 1 unknown  . metoprolol tartrate (LOPRESSOR) 100 MG tablet Take 1 tablet (100  mg total) by mouth 2 (two) times daily.   10/04/2017 at 930  . ondansetron (ZOFRAN ODT) 4 MG disintegrating tablet Take 1 tablet (4 mg total) by mouth every 4 (four) hours as needed for nausea or vomiting. 20 tablet 0 unknown  . Polyethyl Glycol-Propyl Glycol (SYSTANE OP) Apply 2 drops to eye daily as needed (dry eyes).    unk at prn  . potassium chloride SA (K-DUR,KLOR-CON) 20 MEQ tablet Take 0.5 tablets (10 mEq total) by mouth daily. (Patient taking differently: Take 20 mEq by mouth daily. )   10/04/2017 at am  . timolol (TIMOPTIC) 0.5 % ophthalmic solution Place 1 drop into both eyes daily.   3 10/04/2017 at 930  . warfarin (COUMADIN) 1 MG tablet Take 2.5 tablets daily as directed by Coumadin clinic (Patient taking differently: Take 2.5 mg by mouth daily after supper. or as directed by Coumadin clinic) 80 tablet 3 10/03/2017 at 1900  . levalbuterol (XOPENEX) 0.63 MG/3ML nebulizer solution Take 3 mLs (0.63 mg total) by nebulization every 6 (six) hours as needed for wheezing or shortness of breath. (Patient not taking: Reported on 10/04/2017) 3 mL 2 Not Taking at Unknown time  . ondansetron (ZOFRAN-ODT) 4 MG disintegrating tablet Take 1 tablet (4 mg total) by mouth every 8 (eight) hours as needed for nausea or vomiting. (Patient not taking: Reported on 10/04/2017) 8 tablet 0 Not Taking at Unknown time  . traMADol (ULTRAM) 50 MG tablet Take 1 tablet (50 mg total) by mouth every 6 (six) hours as needed. (Patient not taking: Reported on 10/04/2017) 15 tablet 0 Not Taking at Unknown time   Scheduled: . allopurinol  150 mg Oral Daily  . atorvastatin  10 mg Oral Daily  . brimonidine  1 drop Both Eyes TID  . furosemide  80 mg Oral Daily  . ipratropium  0.5 mg Nebulization Q6H  . isosorbide mononitrate  30 mg Oral Daily  . levothyroxine  88 mcg Oral QAC breakfast  . metoprolol tartrate  50 mg Oral BID  . predniSONE  40 mg Oral Q breakfast  . timolol  1 drop Both Eyes q morning - 10a  . [START ON 10/07/2017]  warfarin  2.5 mg Oral q1800  . Warfarin - Physician Dosing Inpatient   Does not apply q1800    ROS:  History obtained from the patient  General ROS: negative for - chills, fatigue, fever, night sweats, weight gain or weight loss Psychological ROS: negative for - , hallucinations, memory difficulties, mood swings or  Ophthalmic ROS: negative for - blurry vision, double vision, eye pain or loss of vision ENT ROS: negative for - epistaxis, nasal discharge, oral lesions, sore throat, tinnitus or vertigo Respiratory ROS: negative for - cough,  shortness of breath or wheezing Cardiovascular ROS: negative for - chest pain, dyspnea on exertion,  Gastrointestinal ROS: negative for - abdominal pain, diarrhea,  nausea/vomiting or stool incontinence Genito-Urinary ROS: negative for - dysuria, hematuria, incontinence or urinary frequency/urgency Musculoskeletal ROS: Positive-muscular weakness Neurological ROS: as noted in HPI   General Examination:                                                                                                      Blood pressure 125/76, pulse 89, temperature (!) 97.5 F (36.4 C), temperature source Oral, resp. rate 18, height 5\' 4"  (1.626 m), weight 59 kg (130 lb 1.1 oz), SpO2 98 %.  HEENT-  Normocephalic, no lesions, without obvious abnormality.  Normal external eye and conjunctiva.   Extremities- Warm, dry and intact Musculoskeletal-no joint tenderness, deformity or swelling Skin-warm and dry, no hyperpigmentation, vitiligo, or suspicious lesions  Neurological Examination Mental Status: Alert, oriented, thought content appropriate.  Speech fluent without evidence of aphasia.  Able to follow 3 step commands without difficulty. Cranial Nerves: II:  Visual fields grossly normal,  III,IV, VI: ptosis not present, extra-ocular motions  intact bilaterally, pupils equal, round, reactive to light and accommodation V,VII: smile symmetric, facial light touch sensation normal bilaterally VIII: hearing normal bilaterally IX,X: uvula rises symmetrically XI: bilateral shoulder shrug XII: midline tongue extension Motor: 4/5 throughout Sensory: Pinprick and light touch intact throughout, bilaterally Deep Tendon Reflexes: 2+ and symmetric throughout with no knee jerk or ankle jerk Plantars: Right: downgoing   Left: downgoing Cerebellar: normal finger-to-nose,  Gait:    Lab Results: Basic Metabolic Panel: Recent Labs  Lab 09/29/17 1913 10/04/17 1936 10/05/17 1022 10/05/17 1328 10/06/17 0421  NA 142 137 136 139 142  K 4.2 3.4* 6.2* 4.5 3.9  CL 103 100 100 100 103  CO2 28 27 25 28 28   GLUCOSE 109* 94 154* 130* 141*  BUN 35* 24* 24* 25* 29*  CREATININE 1.68* 1.55* 1.44* 1.43* 1.51*  CALCIUM 9.2 8.6* 8.6* 8.8* 8.8*    CBC: Recent Labs  Lab 09/29/17 1913 10/04/17 1936 10/05/17 1328 10/06/17 0421  WBC 11.3* 14.5* 14.5* 9.2  NEUTROABS  --  11.3*  --   --   HGB 11.5* 10.6* 11.7* 10.8*  HCT 37.7 35.1* 38.3 35.6*  MCV 90.0 89.8 89.1 89.2  PLT 234 196 189 197    Lipid Panel: No results for input(s): CHOL, TRIG, HDL, CHOLHDL, VLDL, LDLCALC in the last 168 hours.  CBG: Recent Labs  Lab 09/29/17 1749  GLUCAP 114*    Imaging: Dg Chest 2 View  Result Date: 10/04/2017 CLINICAL DATA:  Generalized weakness, fatigue and shortness of breath increasing  over the last week. History of atrial fibrillation. EXAM: CHEST - 2 VIEW COMPARISON:  09/29/2017 and 09/17/2017. FINDINGS: There is stable cardiomegaly and aortic atherosclerosis. There is chronic central airway thickening, left basilar atelectasis and small bilateral pleural effusions. No edema or confluent airspace opacity. Spinal augmentation changes noted at T7 and L1, without evidence of acute osseous abnormality. IMPRESSION: Overall similar radiographic appearance  of the chest with cardiomegaly, chronic pleural effusions and left basilar atelectasis. No definite acute findings. Electronically Signed   By: Carey Bullocks M.D.   On: 10/04/2017 20:14   Mr Brain Wo Contrast  Result Date: 10/06/2017 CLINICAL DATA:  Encephalopathy EXAM: MRI HEAD WITHOUT CONTRAST TECHNIQUE: Multiplanar, multiecho pulse sequences of the brain and surrounding structures were obtained without intravenous contrast. COMPARISON:  Head CT 07/10/2017 FINDINGS: BRAIN: There is a single subcentimeter focus of hyperintensity on diffusion-weighted imaging within the left frontal white matter. No corresponding ADC abnormality. No acute hemorrhage. The midline structures are normal. There are no old infarcts. The white matter signal is normal for the patient's age. Generalized atrophy, but not greater than expected for age. Susceptibility-sensitive sequences show no chronic microhemorrhage or superficial siderosis. VASCULAR: Major intracranial arterial and venous sinus flow voids are preserved. SKULL AND UPPER CERVICAL SPINE: The visualized skull base, calvarium, upper cervical spine and extracranial soft tissues are normal. SINUSES/ORBITS: No fluid levels or advanced mucosal thickening. No mastoid or middle ear effusion. The orbits are normal. IMPRESSION: 1. Single focus of hyperintensity on diffusion-weighted imaging within the left frontal white matter is likely a early subacute infarct. No associated hemorrhage or mass effect. 2. Otherwise normal MRI of the aging brain. Electronically Signed   By: Deatra Robinson M.D.   On: 10/06/2017 03:57    Assessment and plan discussed with with attending physician and they are in agreement.    Felicie Morn PA-C [facility] [phone]  10/06/2017, 9:43 AM   Attending Neurohospitalist Addendum Patient seen and examined with APP/Resident. Agree with the history and physical as documented above. I have independently reviewed the chart, obtained  history, review of systems and examined the patient.I have personally reviewed pertinent head/neck/spine imaging (CT/MRI). MRI brain shows a punctate area of hyperintensity in the left frontal white matter, otherwise no abnormality.   Assessment: 84 y.o. female with transient confusion while in the hospital upon wakening.  At this point she is back to her baseline.  The question if she has some underlying neurodegenerative process along with this could have been a period of delirium-question if she was on opiates for kyphoplasty.  I do not believe the punctate infarct had any effect with the hallucinations. She will need stroke work-up.  Stroke Risk Factors - atrial fibrillation, hyperlipidemia and hypertension  Recommend --HgbA1c, fasting lipid panel --MRI/MRA of the brain without contrast --PT consult, OT consult, Speech consult --Echocardiogram--saying that she was off her Coumadin for multiple days --80 mg of Atorvistatin --Prophylactic therapy-Antiplatelet med: -Coumadin has been restarted and she is currently supratherapeutic-keep on Coumadin with goal INR 2-3 --Frequent neuro checks --NPO until passes stroke swallow screen --please page stroke NP  Or  PA  Or MD from 8am -4 pm  as this patient from this time will be  followed by the stroke.   You can look them up on [email or web address]  Password [email or web address]  -- Milon Dikes, MD [facility] Pager: [phone] If 7pm to 7am, please call on call as listed on AMION.

## 2017-10-06 NOTE — Care Management Note (Addendum)
Case Management Note  Patient Details  Name: Tonya Farley MRN: [id number] Date of Birth: 02/28/1920  Subjective/Objective:                    Action/Plan:  Spoke to patient at bedside and niece Janie [phone]. Confirmed PCP is Dr Burton Apleyonald Roberts. Patient lives alone , Wille CelesteJanie has " part time hired help" but not 24 hour assistance. Explained PT/OT recommended 24 hour assistance , but was up to family to arrange. Janie voiced understanding. Wille CelesteJanie would like [facility]. [facility]. Expected Discharge Date:  10/05/17               Expected Discharge Plan:  Home w Home Health Services  In-House Referral:     Discharge planning Services  CM Consult  Post Acute Care Choice:    Choice offered to:  Patient  DME Arranged:  N/A DME Agency:  NA  HH Arranged:  RN, PT, OT, Nurse's Aide HH Agency:  [facility]  Status of Service:  In process, will continue to follow  If discussed at Long Length of Stay Meetings, dates discussed:    Additional Comments:  Kingsley PlanWile, Irianna Gilday Marie, RN 10/06/2017, 1:38 PM

## 2017-10-06 NOTE — Progress Notes (Signed)
Physical Therapy Treatment Patient Details Name: Tonya Farley MRN: [id number] DOB: [birth year] Today's Date: 10/06/2017    History of Present Illness  Tonya Farley is a 84 y.o. female with chronic persistent afib, systolic CHF, CKD who presents with generalized weakness, wheezing and non-productive cough x 3 days in setting of recent kyphoplasty on 09/26/17 for thoracic vertebrae compression fracture.    PT Comments    Continuing work on functional mobility and activity tolerance;  More energy today, and able to walk in the hallway with minimal fatigue; pt seemed pleased with her progress   Follow Up Recommendations  Home health PT;Supervision/Assistance - 24 hour     Equipment Recommendations  None recommended by PT    Recommendations for Other Services       Precautions / Restrictions Precautions Precautions: Fall;Back Precaution Booklet Issued: (recent kyphoplasty) Precaution Comments: monitor O2    Mobility  Bed Mobility                  Transfers Overall transfer level: Needs assistance Equipment used: Rolling walker (2 wheeled) Transfers: Sit to/from Stand Sit to Stand: Min guard         General transfer comment: Cues for hand placement and safety ; noted good control with stand to sit  Ambulation/Gait Ambulation/Gait assistance: Min guard Gait Distance (Feet): 120 Feet Assistive device: Rolling walker (2 wheeled) Gait Pattern/deviations: Step-through pattern;Decreased stride length;Trunk flexed Gait velocity: slow   General Gait Details: Generally steady with the support of a rolling walker. She uses rollator at baseline. Slow without significant loss of balance noted. SpO2 94-96% on room air   Stairs             Wheelchair Mobility    Modified Rankin (Stroke Patients Only)       Balance     Sitting balance-Leahy Scale: Good       Standing balance-Leahy Scale: Fair                              Cognition  Arousal/Alertness: Awake/alert Behavior During Therapy: WFL for tasks assessed/performed Overall Cognitive Status: Within Functional Limits for tasks assessed                                        Exercises      General Comments        Pertinent Vitals/Pain Pain Assessment: No/denies pain    Home Living                      Prior Function            PT Goals (current goals can now be found in the care plan section) Acute Rehab PT Goals Patient Stated Goal: return home PT Goal Formulation: With patient Time For Goal Achievement: 10/19/17 Potential to Achieve Goals: Good Progress towards PT goals: Progressing toward goals    Frequency    Min 3X/week      PT Plan Current plan remains appropriate    Co-evaluation              AM-PAC PT "6 Clicks" Daily Activity  Outcome Measure  Difficulty turning over in bed (including adjusting bedclothes, sheets and blankets)?: A Little Difficulty moving from lying on back to sitting on the side of the bed? : A Little Difficulty sitting down  on and standing up from a chair with arms (e.g., wheelchair, bedside commode, etc,.)?: A Little Help needed moving to and from a bed to chair (including a wheelchair)?: None Help needed walking in hospital room?: None Help needed climbing 3-5 steps with a railing? : A Little 6 Click Score: 20    End of Session Equipment Utilized During Treatment: Gait belt Activity Tolerance: Patient tolerated treatment well Patient left: in chair;with call bell/phone within reach;with chair alarm set Nurse Communication: Mobility status PT Visit Diagnosis: Difficulty in walking, not elsewhere classified (R26.2);Muscle weakness (generalized) (M62.81)     Time: [id number] PT Time Calculation (min) (ACUTE ONLY): 23 min  Charges:  $Gait Training: 23-37 mins                    G Codes:       Van ClinesHolly Maddelynn Moosman, PT  Acute Rehabilitation Services Pager [phone] Office  [phone]    Levi AlandHolly H Halina Asano 10/06/2017, 4:21 PM

## 2017-10-06 NOTE — Progress Notes (Signed)
Initial Nutrition Assessment  DOCUMENTATION CODES:   Not applicable  INTERVENTION:   Ensure Enlive po daily, each supplement provides 350 kcal and 20 grams of protein  Add MVI daily   NUTRITION DIAGNOSIS:   Increased nutrient needs related to wound healing as evidenced by estimated needs.  GOAL:   Patient will meet greater than or equal to 90% of their needs  MONITOR:   PO intake, Supplement acceptance, Labs, Weight trends  REASON FOR ASSESSMENT:   Consult COPD Protocol  ASSESSMENT:   84 yo female admitted with generalized weakness, wheezing and cough x 3 days in setting of recent kyphoplasty on 09/26/17. Pt with acute viral bronchitis, subacute left frontal infarct. Pt with hx CHF, CKD, HTN  Pt reports good appetite. Recorded po intake 50% on average. Pt reports she drinks 1 Ensure daily at home; just finishing Ensure Enlive on visit this AM  Pt lives home along and performs own ADLs; family checks in on patient frequently.   Pt denies weight loss; no significant weight loss per weight encounters  Labs: BUN 29, Creatinine 1.51, potassium wdl; HGbA1c 5.9  Meds: prednisone, lasix  NUTRITION - FOCUSED PHYSICAL EXAM:  Not performed  Diet Order:   Diet Order           Diet Heart Room service appropriate? Yes; Fluid consistency: Thin; Fluid restriction: 2000 mL Fluid  Diet effective now          EDUCATION NEEDS:   Not appropriate for education at this time  Skin:  Skin Assessment: Skin Integrity Issues: Skin Integrity Issues:: Unstageable Unstageable: sacrum  Last BM:  7/15  Height:   Ht Readings from Last 1 Encounters:  10/04/17 5\' 4"  (1.626 m)    Weight:   Wt Readings from Last 1 Encounters:  10/06/17 130 lb 1.1 oz (59 kg)    BMI:  Body mass index is 22.33 kg/m.  Estimated Nutritional Needs:   Kcal:  1350-1480 kcals  Protein:  72-78 g  Fluid:  >/= 1.4 L   Tonya Farley Tonya Kuyper MS, RD, LDN, CNSC [phone] Pager  [phone]  Weekend/On-Call Pager

## 2017-10-06 NOTE — Evaluation (Signed)
Occupational Therapy Evaluation Patient Details Name: Tonya Farley MRN: [id number] DOB: [birth year] Today's Date: 10/06/2017    History of Present Illness  Tonya Farley is a 84 y.o. female with chronic persistent afib, systolic CHF, CKD who presents with generalized weakness, wheezing and non-productive cough x 3 days in setting of recent kyphoplasty on 09/26/17 for thoracic vertebrae compression fracture.   Clinical Impression   Pt reports she was mod I with BADL PTA; has assist for household chores one day per week and is still driving. Currently pt requires min assist overall for ADL and min guard for functional mobility; DOE 2/4 with in room mobility and ADL. Pt planning to d/c home alone with intermittent supervision from friends. Recommending HHOT for follow up to maximize independence and safety with ADL and functional mobility upon return home. Pt would benefit from continued skilled OT to address established goals.    Follow Up Recommendations  Home health OT;Supervision/Assistance - 24 hour    Equipment Recommendations  None recommended by OT    Recommendations for Other Services       Precautions / Restrictions Precautions Precautions: Fall;Back Precaution Booklet Issued: (recent kyphoplasty) Precaution Comments: monitor O2 Restrictions Weight Bearing Restrictions: No      Mobility Bed Mobility Overal bed mobility: Needs Assistance Bed Mobility: Rolling;Sidelying to Sit Rolling: Supervision Sidelying to sit: Min guard       General bed mobility comments: Increased time and effort  Transfers Overall transfer level: Needs assistance Equipment used: Rolling walker (2 wheeled) Transfers: Sit to/from Stand Sit to Stand: Min guard;Min assist         General transfer comment: Min guard from EOB, min assist from low toilet. Good hand placement and technique throughout.    Balance Overall balance assessment: Needs assistance Sitting-balance support: Feet  supported;No upper extremity supported Sitting balance-Leahy Scale: Good     Standing balance support: No upper extremity supported;During functional activity Standing balance-Leahy Scale: Fair Standing balance comment: static standing during peri care                           ADL either performed or assessed with clinical judgement   ADL Overall ADL's : Needs assistance/impaired Eating/Feeding: Set up;Sitting   Grooming: Set up;Sitting   Upper Body Bathing: Minimal assistance;Sitting   Lower Body Bathing: Minimal assistance;Sit to/from stand   Upper Body Dressing : Minimal assistance;Sitting   Lower Body Dressing: Minimal assistance;Sit to/from stand   Toilet Transfer: Min guard;Ambulation;Regular Toilet;Grab bars;RW   Toileting- Clothing Manipulation and Hygiene: Minimal assistance;Sit to/from stand Toileting - Clothing Manipulation Details (indicate cue type and reason): for balance with peri care in standing     Functional mobility during ADLs: Min guard;Rolling walker General ADL Comments: DOE 2/4     Financial controller      Pertinent Vitals/Pain Pain Assessment: No/denies pain     Hand Dominance Right   Extremity/Trunk Assessment Upper Extremity Assessment Upper Extremity Assessment: Generalized weakness   Lower Extremity Assessment Lower Extremity Assessment: Defer to PT evaluation   Cervical / Trunk Assessment Cervical / Trunk Assessment: Kyphotic;Other exceptions Cervical / Trunk Exceptions: hx of recent kyphoplasty   Communication Communication Communication: HOH   Cognition Arousal/Alertness: Awake/alert Behavior During Therapy: WFL for tasks assessed/performed Overall Cognitive Status: Within Functional Limits for tasks assessed  General Comments       Exercises     Shoulder Instructions      Home Living Family/patient expects to be discharged to::  Private residence Living Arrangements: Alone Available Help at Discharge: Friend(s);Available PRN/intermittently Type of Home: House Home Access: Stairs to enter Entergy CorporationEntrance Stairs-Number of Steps: 2 Entrance Stairs-Rails: Left Home Layout: One level     Bathroom Shower/Tub: Tub/shower unit;Curtain   FirefighterBathroom Toilet: Standard     Home Equipment: Environmental consultantWalker - 2 wheels;Walker - 4 wheels;Cane - single point;Tub bench;Toilet riser;Grab bars - toilet;Grab bars - tub/shower          Prior Functioning/Environment Level of Independence: Independent with assistive device(s)        Comments: uses rollator for mobility. Denies falls in the past year. Has a "lady" that helps 1 day per week with household chores        OT Problem List: Decreased strength;Decreased activity tolerance;Impaired balance (sitting and/or standing);Decreased knowledge of use of DME or AE;Cardiopulmonary status limiting activity      OT Treatment/Interventions: Self-care/ADL training;Energy conservation;DME and/or AE instruction;Therapeutic activities;Patient/family education;Balance training    OT Goals(Current goals can be found in the care plan section) Acute Rehab OT Goals Patient Stated Goal: return home OT Goal Formulation: With patient Time For Goal Achievement: 10/20/17 Potential to Achieve Goals: Good ADL Goals Pt Will Perform Grooming: with supervision;standing Pt Will Perform Upper Body Bathing: with set-up;sitting Pt Will Perform Lower Body Bathing: with supervision;sit to/from stand Pt Will Transfer to Toilet: with supervision;ambulating;bedside commode Pt Will Perform Toileting - Clothing Manipulation and hygiene: with supervision;sit to/from stand Additional ADL Goal #1: Pt will independently verbally recall 3 energy conservation strategies and utilize during ADL.  OT Frequency: Min 2X/week   Barriers to D/C: Decreased caregiver support  pt lives alone       Co-evaluation               AM-PAC PT "6 Clicks" Daily Activity     Outcome Measure Help from another person eating meals?: None Help from another person taking care of personal grooming?: A Little Help from another person toileting, which includes using toliet, bedpan, or urinal?: A Little Help from another person bathing (including washing, rinsing, drying)?: A Little Help from another person to put on and taking off regular upper body clothing?: A Little Help from another person to put on and taking off regular lower body clothing?: A Little 6 Click Score: 19   End of Session Equipment Utilized During Treatment: Gait belt;Rolling walker Nurse Communication: Other (comment)(RN tech: no chair alarm box, pt had BM)  Activity Tolerance: Patient tolerated treatment well Patient left: in chair;with call bell/phone within reach;Other (comment)(chair alarm pad under pt; no box in room)  OT Visit Diagnosis: Unsteadiness on feet (R26.81)                Time: [id number] OT Time Calculation (min): 27 min Charges:  OT General Charges $OT Visit: 1 Visit OT Evaluation $OT Eval Moderate Complexity: 1 Mod OT Treatments $Self Care/Home Management : 8-22 mins G-Codes:     Gerell Fortson A. Brett Albinooffey, M.S., OTR/L Acute Rehab Department: [phone]  Tonya Farley 10/06/2017, 11:31 AM

## 2017-10-07 ENCOUNTER — Inpatient Hospital Stay (HOSPITAL_COMMUNITY): Payer: Medicare Other

## 2017-10-07 DIAGNOSIS — I6340 Cerebral infarction due to embolism of unspecified cerebral artery: Secondary | ICD-10-CM

## 2017-10-07 DIAGNOSIS — I63411 Cerebral infarction due to embolism of right middle cerebral artery: Secondary | ICD-10-CM

## 2017-10-07 DIAGNOSIS — I361 Nonrheumatic tricuspid (valve) insufficiency: Secondary | ICD-10-CM

## 2017-10-07 LAB — BASIC METABOLIC PANEL
ANION GAP: 10 (ref 5–15)
BUN: 42 mg/dL — AB (ref 8–23)
CALCIUM: 9.6 mg/dL (ref 8.9–10.3)
CHLORIDE: 102 mmol/L (ref 98–111)
CO2: 30 mmol/L (ref 22–32)
CREATININE: 1.73 mg/dL — AB (ref 0.44–1.00)
GFR calc Af Amer: 27 mL/min — ABNORMAL LOW (ref >60)
GFR calc non Af Amer: 24 mL/min — ABNORMAL LOW (ref >60)
Glucose, Bld: 134 mg/dL — ABNORMAL HIGH (ref 70–99)
POTASSIUM: 3.9 mmol/L (ref 3.5–5.1)
Sodium: 142 mmol/L (ref 135–145)

## 2017-10-07 LAB — ECHOCARDIOGRAM LIMITED
Height: 64 in
Weight: 2081.14 oz

## 2017-10-07 LAB — LDL CHOLESTEROL, DIRECT: Direct LDL: 52 mg/dL (ref 0–99)

## 2017-10-07 LAB — PROTIME-INR
INR: 3.99
Prothrombin Time: 38.6 seconds — ABNORMAL HIGH (ref 11.4–15.2)

## 2017-10-07 MED ORDER — MORPHINE SULFATE (PF) 2 MG/ML IV SOLN
0.5000 mg | Freq: Once | INTRAVENOUS | Status: AC | PRN
  Administered 2017-10-07: 0.5 mg via INTRAVENOUS
  Filled 2017-10-07: qty 1

## 2017-10-07 MED ORDER — WARFARIN SODIUM 2.5 MG PO TABS: 2.5000 mg | ORAL_TABLET | Freq: Every day | ORAL | Status: DC

## 2017-10-07 MED ORDER — ONDANSETRON HCL 4 MG/2ML IJ SOLN
4.0000 mg | Freq: Four times a day (QID) | INTRAMUSCULAR | Status: DC | PRN
  Filled 2017-10-07: qty 2

## 2017-10-07 NOTE — NC FL2 (Signed)
[place] MEDICAID FL2 LEVEL OF CARE SCREENING TOOL     IDENTIFICATION  Patient Name: Tonya Farley Birthdate: 18-Jun-1919 Sex: female Admission Date (Current Location): 10/04/2017  [facility] and [place] Number:  Producer, television/film/video and Address:  [facility], [address], [place], [place] [address]      Provider Number: [id number]  Attending Physician Name and Address:  Noralee Stain, DO  Relative Name and Phone Number:  Melany Guernsey [phone]; Wille Celeste Darr-niece, [phone] (mobile)    Current Level of Care: Hospital Recommended Level of Care: Skilled Nursing Facility Prior Approval Number:    Date Approved/Denied:   PASRR Number: [id number](Eff. 06/26/12)  Discharge Plan: SNF    Current Diagnoses: Patient Active Problem List   Diagnosis Date Noted  . Cerebral embolism with cerebral infarction 10/07/2017  . Pressure injury of skin 10/05/2017  . Acute bronchitis 10/05/2017  . Viral bronchitis 10/04/2017  . Acute on chronic systolic heart failure (HCC) 07/09/2017  . Atrial fibrillation with RVR (HCC) 07/09/2017  . Hypothyroidism, adult 07/09/2017  . Pulmonary HTN (HCC) 07/09/2017  . Leukocytosis 07/09/2017  . Acute respiratory failure with hypoxia (HCC) 07/09/2017  . High blood pressure 07/09/2017  . Chronic systolic heart failure (HCC)   . Acute on chronic right heart failure (HCC) 12/22/2016  . CKD (chronic kidney disease), stage III w/ renal artery stenosis 12/22/2016  . Cellulitis of lower extremity 12/22/2016  . Chronic a-fib (HCC) 12/22/2016  . Hypothyroidism 12/22/2016  . Acute diastolic heart failure (HCC) 12/22/2016  . Elevated troponin 12/22/2016  . Acute hypokalemia 12/22/2016  . HTN (hypertension) 12/22/2016  . Dyslipidemia 12/22/2016  . Pulmonary hypertension, moderate to severe (HCC) 12/22/2016  . Abnormal urinalysis 12/22/2016  . Hypertensive heart disease 05/08/2016  . Chronic diastolic heart failure (HCC)  41/32/4401  . Anticoagulated 11/28/2015  . CKD (chronic kidney disease) stage 3, GFR 30-59 ml/min (HCC) 09/11/2015  . CKD (chronic kidney disease) stage 4, GFR 15-29 ml/min (HCC) 09/11/2015  . Pulmonary hypertension (HCC)   . Pleural effusion on left   . Peripheral edema   . Hypokalemia   . Anasarca 08/10/2015  . Acute on chronic respiratory failure with hypoxia (HCC)   . Pleural effusion   . Physical deconditioning   . Cough   . HCAP (healthcare-associated pneumonia)   . Chest congestion   . SOB (shortness of breath)   . Swelling   . Acute on chronic diastolic heart failure (HCC)   . HLD (hyperlipidemia)   . Influenza due to identified novel influenza A virus with other respiratory manifestations 06/07/2015  . Pleural effusion, bacterial   . Dyspnea   . AKI (acute kidney injury) (HCC)   . CAP (community acquired pneumonia) 05/30/2015  . Renal artery stenosis (HCC) 08/08/2014  . Encounter for therapeutic drug monitoring 04/21/2013  . Hypertension 06/25/2012  . Hyperlipidemia 06/25/2012  . Atrial fibrillation (HCC) 06/25/2012  . Compression fracture of lumbar spine, non-traumatic (HCC) 06/25/2012  . Chronic back pain     Orientation RESPIRATION BLADDER Height & Weight     Self, Time, Situation, Place  Normal External catheter Weight: 130 lb 1.1 oz (59 kg) Height:  5\' 4"  (162.6 cm)  BEHAVIORAL SYMPTOMS/MOOD NEUROLOGICAL BOWEL NUTRITION STATUS      Continent Diet(Heart healthy)  AMBULATORY STATUS COMMUNICATION OF NEEDS Skin   Supervision(Min guard) Verbally Other (Comment)(Unstageable pressure injury to sacrum, with foam dressing)  Personal Care Assistance Level of Assistance  Bathing, Feeding, Dressing Bathing Assistance: Limited assistance Feeding assistance: Independent(Assistance with set-up) Dressing Assistance: Limited assistance     Functional Limitations Info  Sight, Hearing, Speech Sight Info: Adequate Hearing Info: Impaired Speech  Info: Adequate    SPECIAL CARE FACTORS FREQUENCY  PT (By licensed PT), OT (By licensed OT)     PT Frequency: Evaluated 7/14 OT Frequency: Evaluated 7/15            Contractures Contractures Info: Not present    Additional Factors Info  Code Status, Allergies Code Status Info: DNR Allergies Info: Penicillins, Sulfa antibiotics           Current Medications (10/07/2017):  This is the current hospital active medication list Current Facility-Administered Medications  Medication Dose Route Frequency Provider Last Rate Last Dose  . acetaminophen (TYLENOL) tablet 650 mg  650 mg Oral Q6H PRN Ike BenePark, Carolyn J, MD   650 mg at 10/05/17 2259  . albuterol (PROVENTIL) (2.5 MG/3ML) 0.083% nebulizer solution 2.5 mg  2.5 mg Nebulization Q4H PRN Noralee Stainhoi, Jennifer, DO      . allopurinol (ZYLOPRIM) tablet 150 mg  150 mg Oral Daily Noralee Stainhoi, Jennifer, DO   150 mg at 10/06/17 0941  . atorvastatin (LIPITOR) tablet 10 mg  10 mg Oral Daily Noralee Stainhoi, Jennifer, DO   10 mg at 10/06/17 0940  . brimonidine (ALPHAGAN) 0.15 % ophthalmic solution 1 drop  1 drop Both Eyes TID Park, Bartholome Billarolyn J, MD   1 drop at 10/07/17 0933  . feeding supplement (ENSURE ENLIVE) (ENSURE ENLIVE) liquid 237 mL  237 mL Oral Daily Noralee Stainhoi, Jennifer, DO      . furosemide (LASIX) tablet 80 mg  80 mg Oral Daily Ike BenePark, Carolyn J, MD   80 mg at 10/06/17 0941  . guaiFENesin-dextromethorphan (ROBITUSSIN DM) 100-10 MG/5ML syrup 5 mL  5 mL Oral Q4H PRN Ike BenePark, Carolyn J, MD   5 mL at 10/05/17 0025  . ipratropium (ATROVENT) nebulizer solution 0.5 mg  0.5 mg Nebulization BID Noralee Stainhoi, Jennifer, DO   0.5 mg at 10/06/17 2109  . isosorbide mononitrate (IMDUR) 24 hr tablet 30 mg  30 mg Oral Daily Park, Bartholome Billarolyn J, MD   30 mg at 10/06/17 0940  . levothyroxine (SYNTHROID, LEVOTHROID) tablet 88 mcg  88 mcg Oral QAC breakfast Ike BenePark, Carolyn J, MD   88 mcg at 10/07/17 0743  . metoprolol tartrate (LOPRESSOR) tablet 50 mg  50 mg Oral BID Ike BenePark, Carolyn J, MD   50 mg at 10/06/17 2136   . morphine 2 MG/ML injection 0.5 mg  0.5 mg Intravenous Once PRN Noralee Stainhoi, Jennifer, DO      . multivitamin with minerals tablet 1 tablet  1 tablet Oral Daily Noralee Stainhoi, Jennifer, DO   1 tablet at 10/06/17 2141  . ondansetron (ZOFRAN) injection 4 mg  4 mg Intravenous Q6H PRN Noralee Stainhoi, Jennifer, DO      . predniSONE (DELTASONE) tablet 40 mg  40 mg Oral Q breakfast Ike BenePark, Carolyn J, MD   40 mg at 10/07/17 0743  . timolol (TIMOPTIC) 0.5 % ophthalmic solution 1 drop  1 drop Both Eyes q morning - 10a Park, Bartholome Billarolyn J, MD   1 drop at 10/07/17 0935  . traMADol (ULTRAM) tablet 50 mg  50 mg Oral Q8H PRN Ike BenePark, Carolyn J, MD   50 mg at 10/07/17 0751  . [START ON 10/08/2017] warfarin (COUMADIN) tablet 2.5 mg  2.5 mg Oral q1800 Noralee Stainhoi, Jennifer, DO      . Warfarin -  Physician Dosing Inpatient   Does not apply q1800 Ike Bene, MD   Stopped at 10/06/17 1800     Discharge Medications: Please see discharge summary for a list of discharge medications.  Relevant Imaging Results:  Relevant Lab Results:   Additional Information ss#[id number]  Cristobal Goldmann, LCSW

## 2017-10-07 NOTE — Progress Notes (Addendum)
PROGRESS NOTE    Tonya Farley  ZOX:[id number] DOB: 10/03/1919 DOA: 10/04/2017 PCP: Burton Apleyoberts, Ronald, MD     Brief Narrative:  Tonya MoatsMamie N Six is a 84 y.o. female with chronic persistent afib (on coumadin), systolic CHF (EF 81-19%35-40%), CKD who presents with generalized weakness, wheezing and non-productive cough x 3 days in setting of recent kyphoplasty on 09/26/17 for thoracic vertebrae compression fracture. She has had episodes of generalized weakness since the surgery and was [facility] for generalized weakness -- at that time she was discharged from ED same day given resolution of symptoms relatively benign workup. Her medical history is also notable for the following: HTN, hypothyroidism, and pulm HTN. Since being back at home starting around this past Thursday, she began having progressively worsening dry cough with wheezing x 3 days. Denies fevers or chills. No sick contacts. Earlier today after going to the bathroom she experienced generalized weakness in her upper extremities and torso. Has been having increased fatigue x 1-2 weeks. No loss of consciousness and no syncope/near syncope. Per family (niece), they assessed her strength in her arms and legs afterwards and did not notice any focal deficits or paresthesias. Of note, patient lives alone and manages all ADL/IADLs herself. Family members include two nieces who check in on her frequently. Niece also noted patient's transient episodes of confusion on 2 occasions in the past 2 weeks, which is very unusual for patient.   New events last 24 hours / Subjective: Complaining of right sided back pain today, pain is lower than where she had kyphoplasty procedure couple of weeks ago. She notes that being in and out of MRI machines over the past 2 days has not helped with her pain. Breathing improved.   Assessment & Plan:   Principal Problem:   Viral bronchitis Active Problems:   Hypertension   Atrial fibrillation  (HCC)   Pressure injury of skin   Acute bronchitis   Cerebral embolism with cerebral infarction   Acute viral bronchitis Respiratory PCR positive for rhinovirus Chest x-ray without acute findings  Patient still with wheezes bilaterally, will continue prednisone    Subacute left frontal infarct Neurology following MRA head with no emergent large vessel occlusion Carotid US showed 1-39% internal carotid artery stenosis bilaterally. Vertebral arteries are patent with antegrade flow  Echo pending  Generalized weakness PT OT evaluation, recommending home health physical therapy  Persistent atrial fibrillation CHADS2VASC 5 Continue Coumadin Started on metoprolol  Chronic systolic heart failure Echocardiogram in April 2019 with EF 35 to 40% Without acute exacerbation  Continue home Lasix  CAD Continue Imdur, atorvastatin   Chronic kidney disease stage 3  Baseline creatinine 1.5-1.8 Stable   Hypothyroidism Continue Synthroid  Recent kyphoplasty No acute change PT OT evaluation, recommending home health physical therapy   DVT prophylaxis: Coumadin  Code Status: DNR Family Communication: No family at bedside, updated niece over the phone  Disposition Plan: Pending further neurology recommendations, improvement in breathing and back pain    Consultants:   Neurology   Procedures:   None   Antimicrobials:  Anti-infectives (From admission, onward)   Start     Dose/Rate Route Frequency Ordered Stop   10/05/17 1000  azithromycin (ZITHROMAX) tablet 250 mg  Status:  Discontinued     250 mg Oral Daily 10/04/17 2144 10/06/17 0919   10/04/17 2200  azithromycin (ZITHROMAX) 500 mg in sodium chloride 0.9 % 250 mL IVPB     500 mg 250 mL/hr over 60  Minutes Intravenous  Once 10/04/17 2144 10/05/17 0700       Objective: Vitals:   10/06/17 2109 10/06/17 2135 10/07/17 0454 10/07/17 0926  BP:  (!) 138/96 (!) 146/93 (!) 155/78  Pulse: 78 (!) 120 (!) 117 (!) 112  Resp: 18  16 18 20   Temp:  98 F (36.7 C) 98.9 F (37.2 C) 98.7 F (37.1 C)  TempSrc:  Oral  Oral  SpO2:  100% 95% 95%  Weight:  59 kg (130 lb 1.1 oz)    Height:        Intake/Output Summary (Last 24 hours) at 10/07/2017 1056 Last data filed at 10/07/2017 0956 Gross per 24 hour  Intake 480 ml  Output 200 ml  Net 280 ml   Filed Weights   10/05/17 0500 10/06/17 0500 10/06/17 2135  Weight: 59 kg (130 lb 1.1 oz) 59 kg (130 lb 1.1 oz) 59 kg (130 lb 1.1 oz)    Examination: General exam: Appears calm and comfortable  Respiratory system: Wheezes bilaterally. Respiratory effort normal. Cardiovascular system: S1 & S2 heard, Irreg rhythm. No JVD, murmurs, rubs, gallops or clicks. No pedal edema. Gastrointestinal system: Abdomen is nondistended, soft and nontender. No organomegaly or masses felt. Normal bowel sounds heard. Central nervous system: Alert and oriented. No focal neurological deficits. Extremities: Symmetric 5 x 5 power. Skin: No rashes, lesions or ulcers Psychiatry: Judgement and insight appear normal. Mood & affect appropriate.    Data Reviewed: I have personally reviewed following labs and imaging studies  CBC: Recent Labs  Lab 10/04/17 1936 10/05/17 1328 10/06/17 0421  WBC 14.5* 14.5* 9.2  NEUTROABS 11.3*  --   --   HGB 10.6* 11.7* 10.8*  HCT 35.1* 38.3 35.6*  MCV 89.8 89.1 89.2  PLT 196 189 197   Basic Metabolic Panel: Recent Labs  Lab 10/04/17 1936 10/05/17 1022 10/05/17 1328 10/06/17 0421 10/07/17 0603  NA 137 136 139 142 142  K 3.4* 6.2* 4.5 3.9 3.9  CL 100 100 100 103 102  CO2 27 25 28 28 30   GLUCOSE 94 154* 130* 141* 134*  BUN 24* 24* 25* 29* 42*  CREATININE 1.55* 1.44* 1.43* 1.51* 1.73*  CALCIUM 8.6* 8.6* 8.8* 8.8* 9.6   GFR: Estimated Creatinine Clearance: 16.4 mL/min (A) (by C-G formula based on SCr of 1.73 mg/dL (H)). Liver Function Tests: No results for input(s): AST, ALT, ALKPHOS, BILITOT, PROT, ALBUMIN in the last 168 hours. No results for  input(s): LIPASE, AMYLASE in the last 168 hours. No results for input(s): AMMONIA in the last 168 hours. Coagulation Profile: Recent Labs  Lab 10/04/17 1936 10/05/17 0432 10/06/17 0421 10/07/17 0603  INR 2.50 2.58 3.56 3.99   Cardiac Enzymes: No results for input(s): CKTOTAL, CKMB, CKMBINDEX, TROPONINI in the last 168 hours. BNP (last 3 results) No results for input(s): PROBNP in the last 8760 hours. HbA1C: Recent Labs    10/06/17 1426  HGBA1C 5.9*   CBG: No results for input(s): GLUCAP in the last 168 hours. Lipid Profile: Recent Labs    10/06/17 1426  LDLDIRECT 52   Thyroid Function Tests: No results for input(s): TSH, T4TOTAL, FREET4, T3FREE, THYROIDAB in the last 72 hours. Anemia Panel: No results for input(s): VITAMINB12, FOLATE, FERRITIN, TIBC, IRON, RETICCTPCT in the last 72 hours. Sepsis Labs: Recent Labs  Lab 10/04/17 2006  LATICACIDVEN 1.36    Recent Results (from the past 240 hour(s))  Respiratory Panel by PCR     Status: Abnormal   Collection Time: 10/04/17 10:38  PM  Result Value Ref Range Status   Adenovirus NOT DETECTED NOT DETECTED Final   Coronavirus 229E NOT DETECTED NOT DETECTED Final   Coronavirus HKU1 NOT DETECTED NOT DETECTED Final   Coronavirus NL63 NOT DETECTED NOT DETECTED Final   Coronavirus OC43 NOT DETECTED NOT DETECTED Final   Metapneumovirus NOT DETECTED NOT DETECTED Final   Rhinovirus / Enterovirus DETECTED (A) NOT DETECTED Final   Influenza A NOT DETECTED NOT DETECTED Final   Influenza B NOT DETECTED NOT DETECTED Final   Parainfluenza Virus 1 NOT DETECTED NOT DETECTED Final   Parainfluenza Virus 2 NOT DETECTED NOT DETECTED Final   Parainfluenza Virus 3 NOT DETECTED NOT DETECTED Final   Parainfluenza Virus 4 NOT DETECTED NOT DETECTED Final   Respiratory Syncytial Virus NOT DETECTED NOT DETECTED Final   Bordetella pertussis NOT DETECTED NOT DETECTED Final   Chlamydophila pneumoniae NOT DETECTED NOT DETECTED Final   Mycoplasma  pneumoniae NOT DETECTED NOT DETECTED Final    Comment: Performed at [facility], [address], [place], [place] [address]       Radiology Studies: Mr Shirlee Latch HY Contrast  Result Date: 10/06/2017 CLINICAL DATA:  Follow-up LEFT frontal lobe infarct. EXAM: MRA HEAD WITHOUT CONTRAST TECHNIQUE: Angiographic images of the Circle of Willis were obtained using MRA technique without intravenous contrast. COMPARISON:  MRI of the head October 06, 2017 at 0237 hours. FINDINGS: ANTERIOR CIRCULATION: Normal flow related enhancement of the included cervical, petrous, cavernous and supraclinoid internal carotid arteries. Patent anterior communicating artery. Patent anterior and middle cerebral arteries, mild luminal irregularity and most compatible with atherosclerosis. No large vessel occlusion, flow limiting stenosis, aneurysm. POSTERIOR CIRCULATION: LEFT vertebral artery is dominant. Vertebrobasilar arteries are patent, with normal flow related enhancement of the main branch vessels. Patent posterior cerebral arteries. No large vessel occlusion, flow limiting stenosis,  aneurysm. ANATOMIC VARIANTS: None. Source images and MIP images were reviewed. IMPRESSION: 1. No emergent large vessel occlusion or flow-limiting stenosis. Electronically Signed   By: Awilda Metro M.D.   On: 10/06/2017 16:29   Mr Brain Wo Contrast  Result Date: 10/06/2017 CLINICAL DATA:  Encephalopathy EXAM: MRI HEAD WITHOUT CONTRAST TECHNIQUE: Multiplanar, multiecho pulse sequences of the brain and surrounding structures were obtained without intravenous contrast. COMPARISON:  Head CT 07/10/2017 FINDINGS: BRAIN: There is a single subcentimeter focus of hyperintensity on diffusion-weighted imaging within the left frontal white matter. No corresponding ADC abnormality. No acute hemorrhage. The midline structures are normal. There are no old infarcts. The white matter signal is normal for the patient's age. Generalized atrophy, but not  greater than expected for age. Susceptibility-sensitive sequences show no chronic microhemorrhage or superficial siderosis. VASCULAR: Major intracranial arterial and venous sinus flow voids are preserved. SKULL AND UPPER CERVICAL SPINE: The visualized skull base, calvarium, upper cervical spine and extracranial soft tissues are normal. SINUSES/ORBITS: No fluid levels or advanced mucosal thickening. No mastoid or middle ear effusion. The orbits are normal. IMPRESSION: 1. Single focus of hyperintensity on diffusion-weighted imaging within the left frontal white matter is likely a early subacute infarct. No associated hemorrhage or mass effect. 2. Otherwise normal MRI of the aging brain. Electronically Signed   By: Deatra Robinson M.D.   On: 10/06/2017 03:57      Scheduled Meds: . allopurinol  150 mg Oral Daily  . atorvastatin  10 mg Oral Daily  . brimonidine  1 drop Both Eyes TID  . feeding supplement (ENSURE ENLIVE)  237 mL Oral Daily  . furosemide  80 mg Oral Daily  . ipratropium  0.5 mg Nebulization BID  . isosorbide mononitrate  30 mg Oral Daily  . levothyroxine  88 mcg Oral QAC breakfast  . metoprolol tartrate  50 mg Oral BID  . multivitamin with minerals  1 tablet Oral Daily  . predniSONE  40 mg Oral Q breakfast  . timolol  1 drop Both Eyes q morning - 10a  . [START ON 10/08/2017] warfarin  2.5 mg Oral q1800  . Warfarin - Physician Dosing Inpatient   Does not apply q1800   Continuous Infusions:   LOS: 2 days    Time spent: 20 minutes   Noralee Stain, DO [facility] [email or web address] Password [facility] 10/07/2017, 10:56 AM

## 2017-10-07 NOTE — Progress Notes (Signed)
*  Preliminary Results* Carotid artery duplex has been completed. Findings suggest 1-39% internal carotid artery stenosis bilaterally. Vertebral arteries are patent with antegrade flow.  10/07/2017 10:10 AM  Gertie FeyMichelle Jahson Emanuele, BS, RVT, RDCS, RDMS

## 2017-10-07 NOTE — Progress Notes (Addendum)
STROKE TEAM PROGRESS NOTE   INTERVAL HISTORY No family is at the bedside.  She recounted HPI. Anxious to go home. Denies new neuro deficits. Back pain still the same. Does report a cough.  Vitals:   10/06/17 2109 10/06/17 2135 10/07/17 0454 10/07/17 0926  BP:  (!) 138/96 (!) 146/93 (!) 155/78  Pulse: 78 (!) 120 (!) 117 (!) 112  Resp: 18 16 18 20   Temp:  98 F (36.7 C) 98.9 F (37.2 C) 98.7 F (37.1 C)  TempSrc:  Oral  Oral  SpO2:  100% 95% 95%  Weight:  59 kg (130 lb 1.1 oz)    Height:        CBC:  Recent Labs  Lab 10/04/17 1936 10/05/17 1328 10/06/17 0421  WBC 14.5* 14.5* 9.2  NEUTROABS 11.3*  --   --   HGB 10.6* 11.7* 10.8*  HCT 35.1* 38.3 35.6*  MCV 89.8 89.1 89.2  PLT 196 189 197    Basic Metabolic Panel:  Recent Labs  Lab 10/06/17 0421 10/07/17 0603  NA 142 142  K 3.9 3.9  CL 103 102  CO2 28 30  GLUCOSE 141* 134*  BUN 29* 42*  CREATININE 1.51* 1.73*  CALCIUM 8.8* 9.6   Lipid Panel:     Component Value Date/Time   CHOL 155 11/28/2015 1535   TRIG 122 11/28/2015 1535   HDL 58 11/28/2015 1535   CHOLHDL 2.7 11/28/2015 1535   VLDL 24 11/28/2015 1535   LDLCALC 73 11/28/2015 1535   HgbA1c:  Lab Results  Component Value Date   HGBA1C 5.9 (H) 10/06/2017   Urine Drug Screen: No results found for: LABOPIA, COCAINSCRNUR, LABBENZ, AMPHETMU, THCU, LABBARB  Alcohol Level No results found for: [facility]  IMAGING Mr Maxine Glenn Head Wo Contrast  Result Date: 10/06/2017 CLINICAL DATA:  Follow-up LEFT frontal lobe infarct. EXAM: MRA HEAD WITHOUT CONTRAST TECHNIQUE: Angiographic images of the Circle of Willis were obtained using MRA technique without intravenous contrast. COMPARISON:  MRI of the head October 06, 2017 at 0237 hours. FINDINGS: ANTERIOR CIRCULATION: Normal flow related enhancement of the included cervical, petrous, cavernous and supraclinoid internal carotid arteries. Patent anterior communicating artery. Patent anterior and middle cerebral arteries, mild luminal  irregularity and most compatible with atherosclerosis. No large vessel occlusion, flow limiting stenosis, aneurysm. POSTERIOR CIRCULATION: LEFT vertebral artery is dominant. Vertebrobasilar arteries are patent, with normal flow related enhancement of the main branch vessels. Patent posterior cerebral arteries. No large vessel occlusion, flow limiting stenosis,  aneurysm. ANATOMIC VARIANTS: None. Source images and MIP images were reviewed. IMPRESSION: 1. No emergent large vessel occlusion or flow-limiting stenosis. Electronically Signed   By: Awilda Metro M.D.   On: 10/06/2017 16:29   Mr Brain Wo Contrast  Result Date: 10/06/2017 CLINICAL DATA:  Encephalopathy EXAM: MRI HEAD WITHOUT CONTRAST TECHNIQUE: Multiplanar, multiecho pulse sequences of the brain and surrounding structures were obtained without intravenous contrast. COMPARISON:  Head CT 07/10/2017 FINDINGS: BRAIN: There is a single subcentimeter focus of hyperintensity on diffusion-weighted imaging within the left frontal white matter. No corresponding ADC abnormality. No acute hemorrhage. The midline structures are normal. There are no old infarcts. The white matter signal is normal for the patient's age. Generalized atrophy, but not greater than expected for age. Susceptibility-sensitive sequences show no chronic microhemorrhage or superficial siderosis. VASCULAR: Major intracranial arterial and venous sinus flow voids are preserved. SKULL AND UPPER CERVICAL SPINE: The visualized skull base, calvarium, upper cervical spine and extracranial soft tissues are normal. SINUSES/ORBITS: No fluid levels  or advanced mucosal thickening. No mastoid or middle ear effusion. The orbits are normal. IMPRESSION: 1. Single focus of hyperintensity on diffusion-weighted imaging within the left frontal white matter is likely a early subacute infarct. No associated hemorrhage or mass effect. 2. Otherwise normal MRI of the aging brain. Electronically Signed   By: Deatra Robinson M.D.   On: 10/06/2017 03:57   Carotid Doppler   There is 1-39% bilateral ICA stenosis. Vertebral artery flow is antegrade.   2D Echocardiogram  - Left ventricle: The cavity size was normal. Systolic function was moderately to severely reduced. The estimated ejection fraction was in the range of 30% to 35%. Diffuse hypokinesis. - Aortic valve: There was trivial regurgitation. - Mitral valve: Calcified annulus. Mildly thickened leaflets . - Left atrium: The atrium was severely dilated. - Right ventricle: The cavity size was mildly dilated. - Right atrium: The atrium was severely dilated. - Tricuspid valve: There was moderate-severe regurgitation. - Pulmonary arteries: Systolic pressure was severely increased. PA peak pressure: 71 mm Hg (S). Impressions:  Moderate to severe global reduction in LV systolic function; trace AI; severe biatrial enlargement; mild RVE; moderate to severe TR with severe pulmonary hypertension.   PHYSICAL EXAM HEENT-  Normocephalic, no lesions, without obvious abnormality.  Normal external eye and conjunctiva.  Extremities- Warm, dry and intact Musculoskeletal-no joint tenderness, deformity or swelling Skin-warm and dry, no hyperpigmentation, vitiligo, or suspicious lesions  Neurological Examination Mental Status: Woke easily to voice. Alert, oriented, thought content appropriate.  Speech fluent without evidence of aphasia.  Able to follow 3 step commands without difficulty. Cranial Nerves: II:  Visual fields grossly normal,  III,IV, VI: ptosis not present, extra-ocular motions intact bilaterally, pupils equal, round, reactive to light and accommodation V,VII: smile symmetric, facial light touch sensation normal bilaterally VIII: hearing normal bilaterally IX,X: uvula rises symmetrically XI: bilateral shoulder shrug XII: midline tongue extension Motor: 4/5 throughout Sensory: Pinprick and light touch intact throughout, bilaterally Deep Tendon  Reflexes: 2+ and symmetric throughout with no knee jerk or ankle jerk Plantars: Right: downgoing                                Left: downgoing Cerebellar: normal finger-to-nose,  Gait:  Deferred No change in neuro exam   ASSESSMENT/PLAN [name] is a 84 y.o. female with history of HLD, HTN, AF on warfarin, sCHF, CKD, back pain w/ recent kyphoplasty 09/26/2017 presenting with generalized weakness and transient confusion.   Stroke:  Small left frontal white matter infarct secondary to small vessel disease (given location) vs known atrial fibrillation   MRI  Small L frontal white matter infarct, o/w Unremarkable   MRA  No LVO or flow-limiting stenosis  Carotid Doppler  B ICA 1-39% stenosis, VAs antegrade   2D Echo  4/19 - EF 35-40%, LA severe dilated, pulm HTN 86  2D Echo  10/07/17 - EF 30-35%, LA severe dilated, pulm HTN 71  LDL 52  HgbA1c 5.9  Warfarin for VTE prophylaxis Diet Order           Diet Heart Room service appropriate? Yes; Fluid consistency: Thin; Fluid restriction: 2000 mL Fluid  Diet effective now           warfarin daily prior to admission, now on warfarin daily. Continue warfarin at d/c  Therapy recommendations:  HH PT and OT though family want ST SNF. SW involved.   Disposition:  pending  (lives  alone PTA, nieces check on her)  Ok for d/c from stroke standpoint once stroke workup completed. Follow up stroke clinic. Order placed.  Atrial Fibrillation  Home anticoagulation:  warfarin daily , resumed 7/13 following kyphoplast  Warfarin continued in the hospital  INR 3.56 on admission,  2.5recently  CHA2DS2-VASc Score = at least 5, ?2 oral anticoagulation recommended . Continue warfarin daily at discharge  Hypertension  Stable . Permissive hypertension (OK if < 220/120) but gradually normalize in 5-7 days . Long-term BP goal normotensive  Hyperlipidemia  Home meds:  lipitor 10, resumed in hospital  LDL 52, goal < 70  Continue  statin at discharge  Other Stroke Risk Factors  Advanced age  Systolic Congestive heart failure  Other Active Problems  Generalized weakness w/ coughing and wheezing - viral bronchitis vs atypical PNA. On pred x 5 days w/ azithromycin  CKD Cr 1.73  Chronic back pain, recent kyphoplasty 09/26/2017  Hypothyroidism  Pulm HTN  Hypokalemia, resolved 3.9  B glaucoma  Hospital day # 2  Annie MainSharon Biby, MSN, APRN, ANVP-BC, AGPCNP-BC Advanced Practice Stroke Nurse [facility] See Amion for Schedule & Pager information 10/07/2017 10:01 AM   ATTENDING NOTE: I reviewed above note and agree with the assessment and plan. I have made any additions or clarifications directly to the above note. Pt was seen and examined.   84 year old female with history of A. fib on Coumadin, CHF, CKD, HLD, HTN admitted for increased fatigue and confusion.  MRI showed right frontal CR punctate infarct.  MRA negative.  Carotid Doppler unremarkable.  EF 30 to 35%, severe biatrial enlargement.  LDL 52 and A1c 5.9.  She was recently off Coumadin for T7 kyphoplasty which was performed on 09/26/2017.  As per notes, her Coumadin restarted on 10/04/2017.  On this admission INR 3.56-> 3.99, supratherapeutic.  Her stroke could be due to small vessel disease or embolic while off Coumadin.  Recommend continue Coumadin with INR goal 2-3.  Continue Lipitor 10 for stroke prevention.  PT/OT recommend home health PT/OT.   Neurology will sign off. Please call with questions. Pt will [facility] in about 4 weeks. Thanks for the consult.   Marvel PlanJindong Sharmane Dame, MD PhD Stroke Neurology 10/08/2017 12:13 AM     To contact Stroke Continuity provider, please refer to [email or web address]. After hours, contact General Neurology

## 2017-10-07 NOTE — Clinical Social Work Note (Signed)
Clinical Social Work Assessment  Patient Details  Name: Tonya Farley MRN: [id number] Date of Birth: [birth year]  Date of referral:  10/06/17               Reason for consult:  Discharge Planning, Facility Placement                Permission sought to share information with:  Family Supports Permission granted to share information::  Yes, Verbal Permission Granted  Name::     Tonya Farley and Tonya Farley  Agency::     Relationship::  [employer]  Contact Information:  Tonya Farley - [phone] and Tonya Farley - [phone]  Housing/Transportation Living arrangements for the past 2 months:  Single Family Home Source of Information:  Patient, Other (Comment Required)(Nieces) Patient Interpreter Needed:  None Criminal Activity/Legal Involvement Pertinent to Current Situation/Hospitalization:  No - Comment as needed Significant Relationships:  Church, Other Family Members Lives with:  Self Do you feel safe going back to the place where you live?  No(Patient in agreement with ST rehab before returning home) Need for family participation in patient care:  Yes (Comment)  Care giving concerns:  Nieces expressed concerns about patient returning home at discharge as she lives alone. Tonya Farley expressed understanding of her nieces' concern and is agreeable to ST rehab.  Social Worker assessment / plan: CSW talked with nieces by phone (Tonya Farley on the 15th and Tonya Farley on the 16th) regarding their concerns regarding patient's discharge. Per nieces, their aunt has no other family, her husband is deceased and she never had children.  Tonya Farley lives 30 minutes away and Tonya Farley lives closer to patient, but cannot go to the home to check on her everyday. CSW advised that Tonya Farley was fine until she began having weakness of her arms and legs. Per Tonya Farley, patient has a life alert and would use it after experiencing weakness in her arms and legs or call one of them and be taken to hospital, however usually  once she got to the hospital, the symptoms would be gone. Per nieces, it was weakness and not being coherent that brought her to the hospital and caused her to be admitted.  Both nieces expressed concerns regarding patient retuning home, having same type symptoms and possibly falling.   Nieces want ST rehab for patient as she will not have 24/7 care/supervision at home and the facility preference is [facility] as a lot of her church members are at [employer]. During visit to the room on 7/16, CSW talked with patient and niece, and Tonya Farley expressed understanding of her nieces' concerns and a agreement with ST rehab. Patient was sitting up in bed and was alert, oriented and engaged in conversation with CSW and her niece. Tonya Farley also would prefer [facility] facility for rehab., because of knowing so many people there.  CSW provided Tonya Farley with ALF list (at her request) and SNF list with facility responses. Niece was asked to have a 2nd choice in case [facility] unable to take patient and Tonya Farley expressed understanding.     Employment status:  Retired Engineer, mining, Games developer PT Recommendations:  Home with Home Health, 24 Hour Supervision Information / Referral to community resources:  Skilled Nursing Facility, Other (Comment Required)(Tonya Farley provided with SNF and ALF lists)  Patient/Family's Response to care: No concerns expressed by patient or Tonya Farley regarding patient's care during hospitalization.   Patient/Family's Understanding of and Emotional Response to  Diagnosis, Current Treatment, and Prognosis:  Both nieces are concerned about patient's health concerns and per Tonya Farley today, she was advised by the doctor that patient had a stroke and is being followed by the stroke team. Ms. Mayford KnifeDarr expressed wanting to assure that patient gets the appropriate medical care before being discharged.   Emotional Assessment Appearance:  Appears younger than stated  age Attitude/Demeanor/Rapport:  Engaged Affect (typically observed):  Pleasant, Appropriate Orientation:  Oriented to Self, Oriented to Place, Oriented to  Time, Oriented to Situation Alcohol / Substance use:  Tobacco Use, Alcohol Use, Illicit Drugs(Patient reported that she has never smoked and does not drink or use illicit drugs) Psych involvement (Current and /or in the community):  No (Comment)  Discharge Needs  Concerns to be addressed:  Discharge Planning Concerns Readmission within the last 30 days:  No Current discharge risk:  None Barriers to Discharge:  Continued Medical Work up   CHS IncCrawford, Lazaro ArmsVanessa Bradley, LCSW 10/07/2017, 2:33 PM

## 2017-10-07 NOTE — Progress Notes (Signed)
  Echocardiogram 2D Echocardiogram has been performed.  Tonya SavoyCasey Farley Tonya Farley 10/07/2017, 10:46 AM

## 2017-10-08 DIAGNOSIS — I481 Persistent atrial fibrillation: Secondary | ICD-10-CM

## 2017-10-08 LAB — BASIC METABOLIC PANEL
ANION GAP: 13 (ref 5–15)
BUN: 47 mg/dL — ABNORMAL HIGH (ref 8–23)
CALCIUM: 9.2 mg/dL (ref 8.9–10.3)
CO2: 27 mmol/L (ref 22–32)
Chloride: 101 mmol/L (ref 98–111)
Creatinine, Ser: 1.65 mg/dL — ABNORMAL HIGH (ref 0.44–1.00)
GFR calc Af Amer: 29 mL/min — ABNORMAL LOW (ref >60)
GFR calc non Af Amer: 25 mL/min — ABNORMAL LOW (ref >60)
Glucose, Bld: 122 mg/dL — ABNORMAL HIGH (ref 70–99)
POTASSIUM: 3.7 mmol/L (ref 3.5–5.1)
Sodium: 141 mmol/L (ref 135–145)

## 2017-10-08 LAB — CBC
HCT: 37.8 % (ref 36.0–46.0)
Hemoglobin: 11.6 g/dL — ABNORMAL LOW (ref 12.0–15.0)
MCH: 27.4 pg (ref 26.0–34.0)
MCHC: 30.7 g/dL (ref 30.0–36.0)
MCV: 89.2 fL (ref 78.0–100.0)
Platelets: 180 10*3/uL (ref 150–400)
RBC: 4.24 MIL/uL (ref 3.87–5.11)
RDW: 19.3 % — AB (ref 11.5–15.5)
WBC: 12.7 10*3/uL — AB (ref 4.0–10.5)

## 2017-10-08 LAB — PROTIME-INR
INR: 3.07
Prothrombin Time: 31.4 seconds — ABNORMAL HIGH (ref 11.4–15.2)

## 2017-10-08 MED ORDER — ONDANSETRON HCL 4 MG PO TABS
4.0000 mg | ORAL_TABLET | Freq: Once | ORAL | Status: AC
  Administered 2017-10-08: 4 mg via ORAL
  Filled 2017-10-08: qty 1

## 2017-10-08 MED ORDER — LEVALBUTEROL HCL 0.63 MG/3ML IN NEBU: 0.6300 mg | INHALATION_SOLUTION | Freq: Three times a day (TID) | RESPIRATORY_TRACT | 2 refills | Status: AC

## 2017-10-08 MED ORDER — IPRATROPIUM-ALBUTEROL 0.5-2.5 (3) MG/3ML IN SOLN: 3.0000 mL | Freq: Four times a day (QID) | RESPIRATORY_TRACT | Status: DC

## 2017-10-08 MED ORDER — DOXYCYCLINE HYCLATE 100 MG PO TABS
100.0000 mg | ORAL_TABLET | Freq: Two times a day (BID) | ORAL | Status: DC
  Administered 2017-10-08: 100 mg via ORAL
  Filled 2017-10-08: qty 1

## 2017-10-08 MED ORDER — METOPROLOL TARTRATE 50 MG PO TABS: 50.0000 mg | ORAL_TABLET | Freq: Two times a day (BID) | ORAL | Status: DC

## 2017-10-08 MED ORDER — MOMETASONE FURO-FORMOTEROL FUM 100-5 MCG/ACT IN AERO
2.0000 | INHALATION_SPRAY | Freq: Two times a day (BID) | RESPIRATORY_TRACT | Status: DC
  Filled 2017-10-08: qty 8.8

## 2017-10-08 MED ORDER — PREDNISONE 10 MG PO TABS: ORAL_TABLET | ORAL | 0 refills | Status: DC

## 2017-10-08 MED ORDER — ACETAMINOPHEN 500 MG PO TABS: 500.0000 mg | ORAL_TABLET | Freq: Four times a day (QID) | ORAL | Status: DC | PRN

## 2017-10-08 MED ORDER — TRAMADOL HCL 50 MG PO TABS: 50.0000 mg | ORAL_TABLET | Freq: Three times a day (TID) | ORAL | 0 refills | Status: AC | PRN

## 2017-10-08 MED ORDER — MOMETASONE FURO-FORMOTEROL FUM 100-5 MCG/ACT IN AERO: 2.0000 | INHALATION_SPRAY | Freq: Two times a day (BID) | RESPIRATORY_TRACT | Status: AC

## 2017-10-08 MED ORDER — DOXYCYCLINE HYCLATE 100 MG PO TABS: 100.0000 mg | ORAL_TABLET | Freq: Two times a day (BID) | ORAL | Status: DC

## 2017-10-08 MED ORDER — GUAIFENESIN-DM 100-10 MG/5ML PO SYRP: 5.0000 mL | ORAL_SOLUTION | ORAL | 0 refills | Status: AC | PRN

## 2017-10-08 NOTE — Progress Notes (Signed)
Report called to [facility]iana [facility]. Dondra SpryMoore, Marrietta Thunder Islee, RN

## 2017-10-08 NOTE — Progress Notes (Signed)
Patient's home warfarin dose of 2.5mg  daily was resumed per MD upon admission. Goal INR 2-3.  Patient's INR was elevated on 7/15 and 7/16 and dose of warfarin was held per discussion with Dr. Alvino Chapelhoi.  Elevated INR likely due to course of prednisone (last dose tomorrow) as well as dose of azithromycin and reduced intake while in the hospital.  Today's INR is 3.07. Plan is for patient to discharge to [facility] today.  Recommend resuming patient's home warfarin regimen of 2.5mg  daily upon discharge. INR check no later than Friday 6/19.  Cyndi Montejano D. Olawale Marney, PharmD, BCPS Clinical Pharmacist [phone] Please check AMION for all [facility] numbers 10/08/2017 10:28 AM

## 2017-10-08 NOTE — Progress Notes (Signed)
Physical Therapy Treatment Patient Details Name: Tonya MoatsMamie N Farley MRN: [id number] DOB: 07/20/1919 Today's Date: 10/08/2017    History of Present Illness  Tonya Farley Voight is a 84 y.o. female with chronic persistent afib, systolic CHF, CKD who presents with generalized weakness, wheezing and non-productive cough x 3 days in setting of recent kyphoplasty on 09/26/17 for thoracic vertebrae compression fracture.    PT Comments    Patient declining ambulation this session due to urinary frequency, but willing to participate in therapeutic exercises. Focused on postural-reeducation and functional strengthening with sit to stands.  Patient requiring min guard assist for transfers. Updated d/c plan to SNF due to patient living alone and needing 24 hour care.    Follow Up Recommendations  SNF;Supervision/Assistance - 24 hour     Equipment Recommendations  None recommended by PT    Recommendations for Other Services       Precautions / Restrictions Precautions Precautions: Fall;Back Restrictions Weight Bearing Restrictions: No    Mobility  Bed Mobility Overal bed mobility: Needs Assistance Bed Mobility: Rolling;Sidelying to Sit Rolling: Supervision Sidelying to sit: Supervision       General bed mobility comments: OOB in recliner  Transfers Overall transfer level: Needs assistance Equipment used: Rolling walker (2 wheeled) Transfers: Sit to/from Stand Sit to Stand: Min guard         General transfer comment: Good control with stand to sit  Ambulation/Gait Ambulation/Gait assistance: Min guard Gait Distance (Feet): 5 Feet Assistive device: None Gait Pattern/deviations: Step-through pattern;Decreased stride length;Trunk flexed     General Gait Details: [facility] to and from [facility]. Declined further ambulation.   Stairs             Wheelchair Mobility    Modified Rankin (Stroke Patients Only)       Balance     Sitting balance-Leahy Scale: Good        Standing balance-Leahy Scale: Fair Standing balance comment: able to statically stand without hand support in order to pull up underwear and perform peri care                            Cognition Arousal/Alertness: Awake/alert Behavior During Therapy: WFL for tasks assessed/performed Overall Cognitive Status: Impaired/Different from baseline Area of Impairment: Memory;Orientation                 Orientation Level: Situation   Memory: Decreased short-term memory                Exercises Other Exercises Other Exercises: Sit to stands x 5 Other Exercises: Scapular retractions x 10 Other Exercises: Cervical retractions x 10 Other Exercises: Backward shoulder rolls x 10    General Comments        Pertinent Vitals/Pain Pain Assessment: No/denies pain    Home Living                      Prior Function            PT Goals (current goals can now be found in the care plan section) Acute Rehab PT Goals Patient Stated Goal: rehab and then home Potential to Achieve Goals: Good Progress towards PT goals: Progressing toward goals    Frequency    Min 3X/week      PT Plan Current plan remains appropriate    Co-evaluation              AM-PAC PT "6 Clicks" Daily Activity  Outcome Measure  Difficulty turning over in bed (including adjusting bedclothes, sheets and blankets)?: A Little Difficulty moving from lying on back to sitting on the side of the bed? : A Little Difficulty sitting down on and standing up from a chair with arms (e.g., wheelchair, bedside commode, etc,.)?: A Little Help needed moving to and from a bed to chair (including a wheelchair)?: A Little Help needed walking in hospital room?: A Little Help needed climbing 3-5 steps with a railing? : A Little 6 Click Score: 18    End of Session   Activity Tolerance: Patient tolerated treatment well Patient left: in chair;with call bell/phone within reach;with chair alarm  set Nurse Communication: Mobility status PT Visit Diagnosis: Difficulty in walking, not elsewhere classified (R26.2);Muscle weakness (generalized) (M62.81)     Time: 1610-9604 PT Time Calculation (min) (ACUTE ONLY): 28 min  Charges:  $Therapeutic Exercise: 8-22 mins $Therapeutic Activity: 8-22 mins                    G Codes:       Laurina Bustle, PT, DPT Acute Rehabilitation Services  Pager: [phone]    Vanetta Mulders 10/08/2017, 12:17 PM

## 2017-10-08 NOTE — Care Management Important Message (Signed)
Important Message  Patient Details  Name: Tonya Farley MRN: [id number] Date of Birth: 03/17/1920   Medicare Important Message Given:  Yes    Amory Simonetti 10/08/2017, 3:01 PM

## 2017-10-08 NOTE — Progress Notes (Signed)
Occupational Therapy Treatment Patient Details Name: Tonya Farley MRN: [id number] DOB: 27-Oct-1919 Today's Date: 10/08/2017    History of present illness  Tonya Farley is a 84 y.o. female with chronic persistent afib, systolic CHF, CKD who presents with generalized weakness, wheezing and non-productive cough x 3 days in setting of recent kyphoplasty on 09/26/17 for thoracic vertebrae compression fracture.   OT comments  Pt with urinary incontinence upon standing. Used as an opportunity for ADL training with pt requiring min assist. Unsteady in standing for pericare/LB bathing with RW. Updated d/c plan to SNF as pt lives alone and needs 24 hour care. Pt with some difficulty recalling why she was in the hospital today.   Follow Up Recommendations  SNF;Supervision/Assistance - 24 hour    Equipment Recommendations  None recommended by OT    Recommendations for Other Services      Precautions / Restrictions Precautions Precautions: Fall;Back       Mobility Bed Mobility Overal bed mobility: Needs Assistance Bed Mobility: Rolling;Sidelying to Sit Rolling: Supervision Sidelying to sit: Supervision       General bed mobility comments: cues for log roll for comfort  Transfers Overall transfer level: Needs assistance Equipment used: Rolling walker (2 wheeled) Transfers: Sit to/from Stand Sit to Stand: Min guard         General transfer comment: Cues for hand placement and safety ; noted good control with stand to sit    Balance     Sitting balance-Leahy Scale: Good       Standing balance-Leahy Scale: Poor Standing balance comment: assist for balance with pericare                           ADL either performed or assessed with clinical judgement   ADL Overall ADL's : Needs assistance/impaired     Grooming: Wash/dry face;Sitting;Set up   Upper Body Bathing: Minimal assistance;Sitting   Lower Body Bathing: Minimal assistance;Sit to/from stand    Upper Body Dressing : Set up;Sitting   Lower Body Dressing: Set up;Sitting/lateral leans Lower Body Dressing Details (indicate cue type and reason): socks Toilet Transfer: Min Medical sales representative Details (indicate cue type and reason): pt with urinary incontinence Toileting- Clothing Manipulation and Hygiene: Minimal assistance;Sit to/from stand Toileting - Clothing Manipulation Details (indicate cue type and reason): assist for standing balance     Functional mobility during ADLs: Min guard;Rolling walker       Vision       Perception     Praxis      Cognition Arousal/Alertness: Awake/alert Behavior During Therapy: WFL for tasks assessed/performed Overall Cognitive Status: Impaired/Different from baseline Area of Impairment: Memory;Orientation                 Orientation Level: Situation   Memory: Decreased short-term memory                  Exercises     Shoulder Instructions       General Comments      Pertinent Vitals/ Pain       Pain Assessment: No/denies pain  Home Living                                          Prior Functioning/Environment              Frequency  Min  2X/week        Progress Toward Goals  OT Goals(current goals can now be found in the care plan section)  Progress towards OT goals: Progressing toward goals  Acute Rehab OT Goals Patient Stated Goal: rehab and then home OT Goal Formulation: With patient Time For Goal Achievement: 10/20/17 Potential to Achieve Goals: Good  Plan Discharge plan remains appropriate    Co-evaluation                 AM-PAC PT "6 Clicks" Daily Activity     Outcome Measure   Help from another person eating meals?: None Help from another person taking care of personal grooming?: A Little Help from another person toileting, which includes using toliet, bedpan, or urinal?: A Little Help from another person bathing (including washing,  rinsing, drying)?: A Little Help from another person to put on and taking off regular upper body clothing?: None Help from another person to put on and taking off regular lower body clothing?: A Little 6 Click Score: 20    End of Session Equipment Utilized During Treatment: Gait belt;Rolling walker  OT Visit Diagnosis: Unsteadiness on feet (R26.81)   Activity Tolerance Patient tolerated treatment well   Patient Left in chair;with call bell/phone within reach;with chair alarm set;with family/visitor present;with nursing/sitter in room   Nurse Communication          Time: [id number] OT Time Calculation (min): 35 min  Charges: OT General Charges $OT Visit: 1 Visit OT Treatments $Self Care/Home Management : 23-37 mins  10/08/2017 Martie RoundJulie Avalynn Farley, OTR/L Pager: [phone]   Tonya Farley, Tonya Farley 10/08/2017, 10:53 AM

## 2017-10-08 NOTE — Clinical Social Work Placement (Signed)
   CLINICAL SOCIAL WORK PLACEMENT  NOTE 10/08/17 - DISCHARGED TO [facility] [place]IA [facility]BULANCE  Date:  10/08/2017  Patient Details  Name: Tonya MoatsMamie N Cloward MRN: [id number] Date of Birth: 08/27/1919  Clinical Social Work is seeking post-discharge placement for this patient at the Skilled  Nursing Facility level of care (*CSW will initial, date and re-position this form in  chart as items are completed):  Yes   Patient/family provided with [place] Clinical Social Work Department's list of facilities offering this level of care within the geographic area requested by the patient (or if unable, by the patient's family).  Yes   Patient/family informed of their freedom to choose among providers that offer the needed level of care, that participate in Medicare, Medicaid or managed care program needed by the patient, have an available bed and are willing to accept the patient.  Yes   Patient/family informed of [facility]'s ownership interest in [facility] and [facility], as well as of the fact that they are under no obligation to receive care at these facilities.  PASRR submitted to EDS on 10/07/17     PASRR number received on 10/07/17     Existing PASRR number confirmed on       FL2 transmitted to all facilities in geographic area requested by pt/family on 10/07/17     FL2 transmitted to all facilities within larger geographic area on       Patient informed that his/her managed care company has contracts with or will negotiate with certain facilities, including the following:        Yes   Patient/family informed of bed offers received.  Patient chooses bed at [facility] [place]     Physician recommends and patient chooses bed at      Patient to be transferred to [facility] [place] on 10/08/17.  Patient to be transferred to facility by Ambulance     Patient family notified on 10/08/17 of transfer.  Name of family member notified:  Daughters Advertising copywriterJanie Darr  and Darel HongJudy Lopp      PHYSICIAN       Additional Comment:    _______________________________________________ Cristobal Goldmannrawford, Jarion Hawthorne Bradley, LCSW 10/08/2017, 12:46 PM

## 2017-10-08 NOTE — Discharge Summary (Signed)
Physician Discharge Summary   Patient ID: Tonya Farley MRN: [id number] DOB/AGE: Jun 22, 1919 84 y.o.  Admit date: 10/04/2017 Discharge date: 10/08/2017  Primary Care Physician:  Burton Apley, MD   Recommendations for Outpatient Follow-up:  1. Follow up with PCP in 1-2 weeks 2. Please check INR on 7/19 and adjust warfarin dose to keep INR 2-3  Home Health: None, patient being discharged to skilled nursing facility Equipment/Devices:   Discharge Condition: stable  CODE STATUS:  DNR   Diet recommendation: Heart healthy diet   Discharge Diagnoses:    . Acute viral bronchitis Subacute left frontal infarct Generalized weakness . Persistent atrial fibrillation (HCC) . Hypertension . Chronic systolic CHF Chronic kidney disease stage III CAD Pulmonary hypertension, severe Tricuspid valve regurgitation  Consults:   Neurology    Allergies:   Allergies  Allergen Reactions  . Penicillins Hives and Swelling    Has patient had a PCN reaction causing immediate rash, facial/tongue/throat swelling, SOB or lightheadedness with hypotension: Yes Has patient had a PCN reaction causing severe rash involving mucus membranes or skin necrosis: Yes Has patient had a PCN reaction that required hospitalization No Has patient had a PCN reaction occurring within the last 10 years: No If all of the above answers are "NO", then may proceed with Cephalosporin use.    . Sulfa Antibiotics Anaphylaxis and Swelling  . Fish Allergy Nausea And Vomiting     DISCHARGE MEDICATIONS: Allergies as of 10/08/2017      Reactions   Penicillins Hives, Swelling   Has patient had a PCN reaction causing immediate rash, facial/tongue/throat swelling, SOB or lightheadedness with hypotension: Yes Has patient had a PCN reaction causing severe rash involving mucus membranes or skin necrosis: Yes Has patient had a PCN reaction that required hospitalization No Has patient had a PCN reaction occurring within the  last 10 years: No If all of the above answers are "NO", then may proceed with Cephalosporin use.   Sulfa Antibiotics Anaphylaxis, Swelling   Fish Allergy Nausea And Vomiting      Medication List    TAKE these medications   acetaminophen 500 MG tablet Commonly known as:  TYLENOL Take 1 tablet (500 mg total) by mouth every 6 (six) hours as needed (pain).   allopurinol 100 MG tablet Commonly known as:  ZYLOPRIM Take 150 mg by mouth daily.   atorvastatin 10 MG tablet Commonly known as:  LIPITOR Take 10 mg by mouth daily.   brimonidine 0.15 % ophthalmic solution Commonly known as:  ALPHAGAN Place 1 drop into both eyes 3 (three) times daily.   cholecalciferol 400 units Tabs tablet Commonly known as:  VITAMIN D Take 400 Units by mouth daily.   diltiazem 120 MG 24 hr capsule Commonly known as:  CARDIZEM CD Take 1 capsule (120 mg total) by mouth daily.   doxycycline 100 MG tablet Commonly known as:  VIBRA-TABS Take 1 tablet (100 mg total) by mouth 2 (two) times daily for 7 days.   feeding supplement Liqd Take 1 Container by mouth 2 (two) times daily between meals. What changed:  when to take this   furosemide 80 MG tablet Commonly known as:  LASIX TAKE 1 TABLET BY MOUTH EVERY DAY   guaiFENesin-dextromethorphan 100-10 MG/5ML syrup Commonly known as:  ROBITUSSIN DM Take 5 mLs by mouth every 4 (four) hours as needed for cough.   isosorbide mononitrate 30 MG 24 hr tablet Commonly known as:  IMDUR Take 1 tablet (30 mg total) by mouth daily.  latanoprost 0.005 % ophthalmic solution Commonly known as:  XALATAN Place 1 drop into both eyes at bedtime.   levalbuterol 0.63 MG/3ML nebulizer solution Commonly known as:  XOPENEX Take 3 mLs (0.63 mg total) by nebulization 3 (three) times daily for 20 days. And q4hours as needed for SOB/wheezing What changed:    when to take this  reasons to take this  additional instructions   levothyroxine 88 MCG tablet Commonly known  as:  SYNTHROID, LEVOTHROID Take 88 mcg by mouth daily before breakfast.   metolazone 2.5 MG tablet Commonly known as:  ZAROXOLYN Take 1 tablet (2.5 mg total) by mouth as needed (take if weight goes up 3 lbs in 1 day or 5 lbs in 1 week). What changed:  reasons to take this   metoprolol tartrate 50 MG tablet Commonly known as:  LOPRESSOR Take 1 tablet (50 mg total) by mouth 2 (two) times daily. What changed:    medication strength  how much to take   mometasone-formoterol 100-5 MCG/ACT Aero Commonly known as:  DULERA Inhale 2 puffs into the lungs 2 (two) times daily.   ondansetron 4 MG disintegrating tablet Commonly known as:  ZOFRAN ODT Take 1 tablet (4 mg total) by mouth every 4 (four) hours as needed for nausea or vomiting.   potassium chloride SA 20 MEQ tablet Commonly known as:  K-DUR,KLOR-CON Take 0.5 tablets (10 mEq total) by mouth daily. What changed:  how much to take   predniSONE 10 MG tablet Commonly known as:  DELTASONE Prednisone dosing: Take  Prednisone 40mg  (4 tabs) x 2 days, then taper to 30mg  (3 tabs) x 3 days, then 20mg  (2 tabs) x 3days, then 10mg  (1 tab) x 3days, then OFF.   SYSTANE OP Apply 2 drops to eye daily as needed (dry eyes).   timolol 0.5 % ophthalmic solution Commonly known as:  TIMOPTIC Place 1 drop into both eyes daily.   traMADol 50 MG tablet Commonly known as:  ULTRAM Take 1 tablet (50 mg total) by mouth every 8 (eight) hours as needed for up to 3 days for severe pain. What changed:    when to take this  reasons to take this   Vitamin B Complex Tabs Take 1 tablet by mouth daily with lunch.   warfarin 1 MG tablet Commonly known as:  COUMADIN Take as directed. If you are unsure how to take this medication, talk to your nurse or doctor. Original instructions:  Take 2.5 tablets daily as directed by Coumadin clinic What changed:    how much to take  how to take this  when to take this  additional instructions         Brief H and P: For complete details please refer to admission H and P, but in brief Tonya Farley a 84 y.o.femalewith chronic persistent afib (on coumadin), systolic CHF (EF 16-10%35-40%), CKD who presents with generalized weakness, wheezing and non-productive cough x 3 days in setting of recent kyphoplasty on 09/26/17 for thoracic vertebrae compression fracture. She has had episodes of generalized weakness since the surgery and [facility]--[facility] of[facility]ptomsrelatively benign workup. Her medical history is also notable for the following: HTN, hypothyroidism, and pulm HTN.Since being back at home starting around this past Thursday, shebegan having progressively worseningdry cough with wheezing x 3 days. Denies fevers or chills. No sick contacts. Earlier today after going to the bathroom she experienced generalized weakness  in her upper extremities and torso. Has been having increased fatigue x 1-2 weeks. No loss of consciousness and no syncope/near syncope. Per family (niece), they assessed her strength in her arms and legs afterwards and did not notice any focal deficitsor paresthesias.Of note, patient lives alone and manages all ADL/IADLs herself. Family members include two nieces who check in on her frequently. Niece also noted patient's transient episodes of confusion on 2 occasions in the past 2 weeks, which is very unusual for patient.    Hospital Course:    Acute viral bronchitis -Respiratory panel PCR positive for rhinovirus.  Chest x-ray did not show acute pneumonia.   - Wheezing improving, continue Xopenex nebs scheduled 3 times daily and as needed, prednisone with taper, Dulera, doxycycline  Subacute left frontal infarct MRI of the brain showed single focus of hyperintensity in the left frontal matter likely early subacute infarct, no associated hemorrhage or  mass-effect. MRA head with no emergent large vessel occlusion Carotid US showed 1-39% internal carotid artery stenosis bilaterally. Vertebral arteries are patent with antegrade flow  2D echo showed EF of 30 to 35% with diffuse hypokinesis, moderate to severe tricuspid valve regurgitation, severe pulmonary hypertension (prior echo 4/19 had shown EF 35 to 40%.) PT OT evaluation recommended 24-hour supervision, family requesting skilled nursing facility Neurology was consulted, LDL 52, A1c 5.9, recommended continue warfarin  Generalized weakness PT OT evaluation recommended 24-hour supervision, home health however patient lives alone prior to admission, family requested skilled nursing facility    Persistent atrial fibrillation CHADS2VASC 5 -Continue metoprolol for rate control, continue Coumadin INR goal 2-3.   -Please check INR on 7/19    Chronic systolic heart failure -2D echo in 4/19 had shown EF of 35 to 40%.  Repeat echo as a part of stroke work-up showed EF 30 to 35% with diffuse hypokinesis, moderate to severe tricuspid valve regurgitation, severe pulmonary hypertension  -Continue home dose Lasix.    CAD Continue Coumadin, metoprolol, Imdur, atorvastatin    Chronic kidney disease stage 3  Baseline creatinine 1.5-1.8 Creatinine at the time of discharge 1.6, stable within baseline  Hypothyroidism Continue Synthroid  Recent kyphoplasty No acute change Plan for discharge to skilled nursing facility     Day of Discharge S: Wheezing improving, no acute issues, anxious to be discharged.  No fevers or chills or any acute events overnight.  BP (!) 163/70 (BP Location: Right Arm)   Pulse 83   Temp (!) 96.8 F (36 C) (Axillary)   Resp (!) 22   Ht 5\' 4"  (1.626 m)   Wt 59 kg (130 lb 1.1 oz)   SpO2 93%   BMI 22.33 kg/m   Physical Exam: General: Alert and awake oriented x3 not in any acute distress. HEENT: anicteric sclera, pupils reactive to light and  accommodation CVS: S1-S2 heard, regular rhythm Chest: Scattered wheezing bilaterally Abdomen: soft nontender, nondistended, normal bowel sounds Extremities: no cyanosis, clubbing or edema noted bilaterally Neuro: Cranial nerves II-XII intact, no focal neurological deficits   The results of significant diagnostics from this hospitalization (including imaging, microbiology, ancillary and laboratory) are listed below for reference.      Procedures/Studies:  Dg Chest 2 View  Result Date: 10/04/2017 CLINICAL DATA:  Generalized weakness, fatigue and shortness of breath increasing over the last week. History of atrial fibrillation. EXAM: CHEST - 2 VIEW COMPARISON:  09/29/2017 and 09/17/2017. FINDINGS: There is stable cardiomegaly and aortic atherosclerosis. There is chronic central airway thickening, left basilar atelectasis and small bilateral  pleural effusions. No edema or confluent airspace opacity. Spinal augmentation changes noted at T7 and L1, without evidence of acute osseous abnormality. IMPRESSION: Overall similar radiographic appearance of the chest with cardiomegaly, chronic pleural effusions and left basilar atelectasis. No definite acute findings. Electronically Signed   By: Carey Bullocks M.D.   On: 10/04/2017 20:14   Dg Chest 2 View  Result Date: 09/29/2017 CLINICAL DATA:  Recent kyphoplasty, awoke with weakness today, history CHF, hypertension, atrial fibrillation EXAM: CHEST - 2 VIEW COMPARISON:  09/17/2017 FINDINGS: Enlargement of cardiac silhouette with pulmonary vascular congestion. Mediastinal contours normal. Atherosclerotic calcification aorta. Bronchitic changes with bibasilar atelectasis greater on LEFT. Upper lungs clear with underlying emphysematous changes noted. Small bibasilar effusions. No pneumothorax. Bones demineralized with prior spinal augmentation procedures at 2 levels. IMPRESSION: Emphysematous and bronchitic changes question COPD. Bibasilar atelectasis LEFT  greater than RIGHT with bibasilar pleural effusions. Electronically Signed   By: Ulyses Southward M.D.   On: 09/29/2017 19:16   Dg Chest 2 View  Result Date: 09/17/2017 CLINICAL DATA:  Two days of upper back pain EXAM: CHEST - 2 VIEW COMPARISON:  Chest x-ray dated July 10, 2017 and chest x-ray of December 22, 2016 FINDINGS: The lungs are adequately inflated. The interstitial markings are coarse. This is not new however. There is chronically increased density at the left lung base. There is there tiny bilateral pleural effusions layering posteriorly which have decreased in size. Cardiac silhouette is enlarged. The pulmonary vascularity is not engorged. There is calcification in the wall of the thoracic aorta. There is multilevel degenerative disc disease of the thoracic spine. Mild loss of height of T8 and T10 is suspected. This is demonstrated to better advantage on the accompanying thoracic spine series. IMPRESSION: Chronic bronchitic changes. Small bilateral pleural effusions which have decreased in size since the previous study. No acute pneumonia nor pulmonary edema. Stable cardiomegaly. Thoracic aortic atherosclerosis. Electronically Signed   By: David  [place] M.D.   On: 09/17/2017 17:01   Dg Thoracic Spine 2 View  Result Date: 09/17/2017 CLINICAL DATA:  Back pain EXAM: THORACIC SPINE 2 VIEWS COMPARISON:  CT chest 06/12/2015 FINDINGS: Limited anatomical evaluation due to osteopenia and patient size. Moderate compression fracture T7 and T9. These were not present on the prior CT. Severe compression fracture L1 with cement vertebral augmentation. IMPRESSION: Moderate compression fractures T7 and T9 of indeterminate age but not present in 2017 and not definitely present on a lateral chest x-ray of 07/10/2017. Limited study Electronically Signed   By: Marlan Palau M.D.   On: 09/17/2017 16:56   Dg Lumbar Spine 2-3 Views  Result Date: 09/17/2017 CLINICAL DATA:  Acute upper back pain. EXAM: LUMBAR SPINE -  2-3 VIEW COMPARISON:  Radiographs of June 23, 2012. FINDINGS: Status post kyphoplasty of L1 and L4 vertebral bodies. No acute fracture or spondylolisthesis is noted. Stable old L3 compression fracture is noted. Atherosclerosis of abdominal aorta is noted. IMPRESSION: Status post kyphoplasty of L1 and L4 vertebral bodies. Old L3 compression fracture is noted. No acute abnormality seen in the lumbar spine. Aortic Atherosclerosis (ICD10-I70.0). Electronically Signed   By: Lupita Raider, M.D.   On: 09/17/2017 16:55   Mr Maxine Glenn Head Wo Contrast  Result Date: 10/06/2017 CLINICAL DATA:  Follow-up LEFT frontal lobe infarct. EXAM: MRA HEAD WITHOUT CONTRAST TECHNIQUE: Angiographic images of the Circle of Willis were obtained using MRA technique without intravenous contrast. COMPARISON:  MRI of the head October 06, 2017 at 0237 hours. FINDINGS: ANTERIOR CIRCULATION: Normal  flow related enhancement of the included cervical, petrous, cavernous and supraclinoid internal carotid arteries. Patent anterior communicating artery. Patent anterior and middle cerebral arteries, mild luminal irregularity and most compatible with atherosclerosis. No large vessel occlusion, flow limiting stenosis, aneurysm. POSTERIOR CIRCULATION: LEFT vertebral artery is dominant. Vertebrobasilar arteries are patent, with normal flow related enhancement of the main branch vessels. Patent posterior cerebral arteries. No large vessel occlusion, flow limiting stenosis,  aneurysm. ANATOMIC VARIANTS: None. Source images and MIP images were reviewed. IMPRESSION: 1. No emergent large vessel occlusion or flow-limiting stenosis. Electronically Signed   By: Awilda Metro M.D.   On: 10/06/2017 16:29   Mr Brain Wo Contrast  Result Date: 10/06/2017 CLINICAL DATA:  Encephalopathy EXAM: MRI HEAD WITHOUT CONTRAST TECHNIQUE: Multiplanar, multiecho pulse sequences of the brain and surrounding structures were obtained without intravenous contrast. COMPARISON:  Head CT  07/10/2017 FINDINGS: BRAIN: There is a single subcentimeter focus of hyperintensity on diffusion-weighted imaging within the left frontal white matter. No corresponding ADC abnormality. No acute hemorrhage. The midline structures are normal. There are no old infarcts. The white matter signal is normal for the patient's age. Generalized atrophy, but not greater than expected for age. Susceptibility-sensitive sequences show no chronic microhemorrhage or superficial siderosis. VASCULAR: Major intracranial arterial and venous sinus flow voids are preserved. SKULL AND UPPER CERVICAL SPINE: The visualized skull base, calvarium, upper cervical spine and extracranial soft tissues are normal. SINUSES/ORBITS: No fluid levels or advanced mucosal thickening. No mastoid or middle ear effusion. The orbits are normal. IMPRESSION: 1. Single focus of hyperintensity on diffusion-weighted imaging within the left frontal white matter is likely a early subacute infarct. No associated hemorrhage or mass effect. 2. Otherwise normal MRI of the aging brain. Electronically Signed   By: Deatra Robinson M.D.   On: 10/06/2017 03:57   Mr Thoracic Spine Wo Contrast  Result Date: 09/18/2017 CLINICAL DATA:  Initial evaluation for acute severe mid back pain. Evaluate for possible compression fracture. EXAM: MRI THORACIC SPINE WITHOUT CONTRAST TECHNIQUE: Multiplanar, multisequence MR imaging of the thoracic spine was performed. No intravenous contrast was administered. COMPARISON:  Prior radiograph from 09/17/2017. FINDINGS: Alignment: Examination limited as the patient was unable to tolerate the full length of the exam. Exaggeration of the normal thoracic kyphosis.  No listhesis. Vertebrae: There is an acute compression fracture involving the T7 vertebral body with associated marrow edema. Height loss measures up to 40% height loss without bony retropulsion. This is benign/osteoporotic in appearance with no underlying pathologic lesion. Chronic  compression deformity at L1 with severe height loss and 6 mm bony retropulsion. Sequelae of prior vertebral augmentation at this level. Chronic height loss at the adjacent inferior endplate of T12. Chronic compression deformity involving L3 noted as well. Vertebral body heights otherwise maintained. No other acute, subacute, or chronic compression fracture identified. Bone marrow signal intensity within normal limits. Benign hemangioma noted within the T10 vertebral body. No other discrete or worrisome osseous lesions. Cord: Signal intensity within the thoracic spinal cord is normal. Conus medullaris terminates at the L1-2 level. Paraspinal and other soft tissues: Mild paraspinous soft tissue edema adjacent to the T7 compression fracture. Paraspinous soft tissues demonstrate no other acute abnormality. Small layering bilateral pleural effusions. Kidneys are atrophic with multiple renal cysts bilaterally. Disc levels: T5-6: Small central disc protrusion indents the ventral thecal sac without significant stenosis. T6-7: Moderate sized central disc protrusion indents the ventral thecal sac. Mild cord flattening without cord signal changes. No significant stenosis. T9-10: Shallow right paracentral disc  protrusion mildly flattens the right ventral thecal sac. No stenosis. T11-12: Bilateral facet hypertrophy, greater on the left. No stenosis. T12-L1: Disc bulge with 6 mm bony retropulsion related to the chronic L1 compression fracture. Flattening with partial effacement of the ventral CSF with mild cord flattening. Mild to moderate spinal stenosis. IMPRESSION: 1. Acute T7 compression fracture with up to 40% height loss without bony retropulsion. This would be amendable to vertebral augmentation. 2. Chronic L1 compression fracture with sequelae of prior vertebral augmentation. Associated 6 mm bony retropulsion with mild to moderate spinal stenosis. 3. Central disc protrusion at T6-7 with resultant mild spinal stenosis. 4.  Small layering bilateral pleural effusions. Electronically Signed   By: Rise Mu M.D.   On: 09/18/2017 15:36   Ir Kypho Thoracic With Bone Biopsy  Result Date: 09/26/2017 INDICATION: Painful T7 compression fracture EXAM: T7 KYPHOPLASTY COMPARISON:  09/18/2017 MR thoracic MEDICATIONS: As antibiotic prophylaxis, vancomycin 1 g was ordered pre-procedure and administered intravenously within 1 hour of incision. ANESTHESIA/SEDATION: Moderate (conscious) sedation was employed during this procedure. A total of Versed 1 mg and Fentanyl 25 mcg was administered intravenously. Moderate Sedation Time: 35 minutes. The patient's level of consciousness and vital signs were monitored continuously by radiology nursing throughout the procedure under my direct supervision. FLUOROSCOPY TIME:  Fluoroscopy Time: 5 minutes 48 seconds (34 mGy) COMPLICATIONS: None immediate. PROCEDURE: Following a full explanation of the procedure along with the potential associated complications, an informed witnessed consent was obtained. The back was prepped and draped in a sterile fashion. 1% lidocaine was utilized for local anesthesia. Under fluoroscopic guidance, a 22 gauge needle was advanced to the left T7 pedicle. 1% lidocaine was infiltrated. Under fluoroscopic guidance, a 10 gauge expressed needle was advanced into the T7 vertebral body via left pedicle approach. The drill was inserted followed by the 15 2 balloon. This was insufflated to 140 pounds per square inch. The balloon was removed and a cement deposition trocar was inserted. Cement was then deposited. The cement trocar was removed. The outer cannula was removed. Final imaging was performed. FINDINGS: Imaging demonstrates access into the T7 vertebral body via left trans pedicle approach. Subsequent imaging documents T7 kyphoplasty via left pedicle. Cement fills the vertebral body and crossing the midline while extending from the inferior to superior endplate. There is no  extraosseous cement. IMPRESSION: Successful T7 kyphoplasty Electronically Signed   By: Jolaine Click M.D.   On: 09/26/2017 12:58   Ir Radiologist Eval & Mgmt  Result Date: 09/18/2017 Please refer to notes tab for details about interventional procedure. (Op Note)      LAB RESULTS: Basic Metabolic Panel: Recent Labs  Lab 10/07/17 0603 10/08/17 0547  NA 142 141  K 3.9 3.7  CL 102 101  CO2 30 27  GLUCOSE 134* 122*  BUN 42* 47*  CREATININE 1.73* 1.65*  CALCIUM 9.6 9.2   Liver Function Tests: No results for input(s): AST, ALT, ALKPHOS, BILITOT, PROT, ALBUMIN in the last 168 hours. No results for input(s): LIPASE, AMYLASE in the last 168 hours. No results for input(s): AMMONIA in the last 168 hours. CBC: Recent Labs  Lab 10/04/17 1936  10/06/17 0421 10/08/17 0547  WBC 14.5*   < > 9.2 12.7*  NEUTROABS 11.3*  --   --   --   HGB 10.6*   < > 10.8* 11.6*  HCT 35.1*   < > 35.6* 37.8  MCV 89.8   < > 89.2 89.2  PLT 196   < > 197  180   < > = values in this interval not displayed.   Cardiac Enzymes: No results for input(s): CKTOTAL, CKMB, CKMBINDEX, TROPONINI in the last 168 hours. BNP: Invalid input(s): POCBNP CBG: No results for input(s): GLUCAP in the last 168 hours.    Disposition and Follow-up: Discharge Instructions    (HEART FAILURE PATIENTS) Call MD:  Anytime you have any of the following symptoms: 1) 3 pound weight gain in 24 hours or 5 pounds in 1 week 2) shortness of breath, with or without a dry hacking cough 3) swelling in the hands, feet or stomach 4) if you have to sleep on extra pillows at night in order to breathe.   Complete by:  As directed    Ambulatory referral to Neurology   Complete by:  As directed    Follow up with stroke clinic NP (Jessica Vanschaick or Darrol Angel, if both not available, consider Dr. Delia Heady, Dr. Jamelle Rushing, or Dr. Naomie Dean) at [facility] in about 4 weeks.   Diet - low sodium heart healthy    Complete by:  As directed    Increase activity slowly   Complete by:  As directed        DISPOSITION: Skilled nursing facility   DISCHARGE FOLLOW-UP Follow-up Information    [facility] - Dme Follow up.   Why:  Provide home health Contact information: [address] [place] [address] [phone]        [facility]. Schedule an appointment as soon as possible for a visit in 4 week(s).   Specialty:  Radiology Contact information: [address] [place] [address] [phone]       Burton Apley, MD. Schedule an appointment as soon as possible for a visit in 2 week(s).   Specialty:  Internal Medicine Contact information: [address] [place] [address] [phone]        Corky Crafts, MD. Schedule an appointment as soon as possible for a visit in 2 week(s).   Specialties:  Cardiology, Radiology, Interventional Cardiology Contact information: [address] [place] [address] [phone]            Time coordinating discharge:  40 minutes  Signed:   Thad Ranger M.D. [facility] 10/08/2017, 10:45 AM Pager: [phone]

## 2017-10-15 ENCOUNTER — Other Ambulatory Visit: Payer: Self-pay

## 2017-10-15 ENCOUNTER — Encounter (HOSPITAL_COMMUNITY): Payer: Self-pay | Admitting: Emergency Medicine

## 2017-10-15 ENCOUNTER — Inpatient Hospital Stay (HOSPITAL_COMMUNITY)
Admission: EM | Admit: 2017-10-15 | Discharge: 2017-10-22 | DRG: 871 | Disposition: A | Discharge status: Skilled Nursing Facility | Payer: Medicare Other | Attending: Internal Medicine | Admitting: Internal Medicine

## 2017-10-15 ENCOUNTER — Emergency Department (HOSPITAL_COMMUNITY): Payer: Medicare Other

## 2017-10-15 DIAGNOSIS — J189 Pneumonia, unspecified organism: Secondary | ICD-10-CM | POA: Diagnosis not present

## 2017-10-15 DIAGNOSIS — Z880 Allergy status to penicillin: Secondary | ICD-10-CM

## 2017-10-15 DIAGNOSIS — N184 Chronic kidney disease, stage 4 (severe): Secondary | ICD-10-CM

## 2017-10-15 DIAGNOSIS — I5022 Chronic systolic (congestive) heart failure: Secondary | ICD-10-CM | POA: Diagnosis not present

## 2017-10-15 DIAGNOSIS — Z882 Allergy status to sulfonamides status: Secondary | ICD-10-CM

## 2017-10-15 DIAGNOSIS — Z8249 Family history of ischemic heart disease and other diseases of the circulatory system: Secondary | ICD-10-CM

## 2017-10-15 DIAGNOSIS — G8929 Other chronic pain: Secondary | ICD-10-CM | POA: Diagnosis present

## 2017-10-15 DIAGNOSIS — Z9981 Dependence on supplemental oxygen: Secondary | ICD-10-CM

## 2017-10-15 DIAGNOSIS — Z66 Do not resuscitate: Secondary | ICD-10-CM | POA: Diagnosis present

## 2017-10-15 DIAGNOSIS — R059 Cough, unspecified: Secondary | ICD-10-CM

## 2017-10-15 DIAGNOSIS — Z79891 Long term (current) use of opiate analgesic: Secondary | ICD-10-CM | POA: Diagnosis not present

## 2017-10-15 DIAGNOSIS — E039 Hypothyroidism, unspecified: Secondary | ICD-10-CM

## 2017-10-15 DIAGNOSIS — Y95 Nosocomial condition: Secondary | ICD-10-CM | POA: Diagnosis present

## 2017-10-15 DIAGNOSIS — Z79890 Hormone replacement therapy (postmenopausal): Secondary | ICD-10-CM

## 2017-10-15 DIAGNOSIS — Z7901 Long term (current) use of anticoagulants: Secondary | ICD-10-CM

## 2017-10-15 DIAGNOSIS — E785 Hyperlipidemia, unspecified: Secondary | ICD-10-CM | POA: Diagnosis present

## 2017-10-15 DIAGNOSIS — Z7951 Long term (current) use of inhaled steroids: Secondary | ICD-10-CM

## 2017-10-15 DIAGNOSIS — I130 Hypertensive heart and chronic kidney disease with heart failure and stage 1 through stage 4 chronic kidney disease, or unspecified chronic kidney disease: Secondary | ICD-10-CM | POA: Diagnosis present

## 2017-10-15 DIAGNOSIS — Z8673 Personal history of transient ischemic attack (TIA), and cerebral infarction without residual deficits: Secondary | ICD-10-CM

## 2017-10-15 DIAGNOSIS — R0902 Hypoxemia: Secondary | ICD-10-CM | POA: Diagnosis present

## 2017-10-15 DIAGNOSIS — I5023 Acute on chronic systolic (congestive) heart failure: Secondary | ICD-10-CM | POA: Diagnosis present

## 2017-10-15 DIAGNOSIS — I482 Chronic atrial fibrillation: Secondary | ICD-10-CM | POA: Diagnosis present

## 2017-10-15 DIAGNOSIS — R05 Cough: Secondary | ICD-10-CM

## 2017-10-15 DIAGNOSIS — Z79899 Other long term (current) drug therapy: Secondary | ICD-10-CM

## 2017-10-15 DIAGNOSIS — D72829 Elevated white blood cell count, unspecified: Secondary | ICD-10-CM | POA: Diagnosis present

## 2017-10-15 DIAGNOSIS — J90 Pleural effusion, not elsewhere classified: Secondary | ICD-10-CM

## 2017-10-15 DIAGNOSIS — Z91013 Allergy to seafood: Secondary | ICD-10-CM

## 2017-10-15 DIAGNOSIS — I2510 Atherosclerotic heart disease of native coronary artery without angina pectoris: Secondary | ICD-10-CM | POA: Diagnosis present

## 2017-10-15 DIAGNOSIS — N183 Chronic kidney disease, stage 3 (moderate): Secondary | ICD-10-CM | POA: Diagnosis present

## 2017-10-15 DIAGNOSIS — R1110 Vomiting, unspecified: Secondary | ICD-10-CM | POA: Diagnosis not present

## 2017-10-15 DIAGNOSIS — A419 Sepsis, unspecified organism: Principal | ICD-10-CM

## 2017-10-15 DIAGNOSIS — J9621 Acute and chronic respiratory failure with hypoxia: Secondary | ICD-10-CM | POA: Diagnosis present

## 2017-10-15 DIAGNOSIS — J918 Pleural effusion in other conditions classified elsewhere: Secondary | ICD-10-CM | POA: Diagnosis present

## 2017-10-15 DIAGNOSIS — I272 Pulmonary hypertension, unspecified: Secondary | ICD-10-CM | POA: Diagnosis present

## 2017-10-15 DIAGNOSIS — E876 Hypokalemia: Secondary | ICD-10-CM

## 2017-10-15 DIAGNOSIS — I509 Heart failure, unspecified: Secondary | ICD-10-CM

## 2017-10-15 DIAGNOSIS — M549 Dorsalgia, unspecified: Secondary | ICD-10-CM | POA: Diagnosis present

## 2017-10-15 DIAGNOSIS — E870 Hyperosmolality and hypernatremia: Secondary | ICD-10-CM

## 2017-10-15 DIAGNOSIS — I4891 Unspecified atrial fibrillation: Secondary | ICD-10-CM | POA: Diagnosis present

## 2017-10-15 DIAGNOSIS — I10 Essential (primary) hypertension: Secondary | ICD-10-CM | POA: Diagnosis present

## 2017-10-15 LAB — I-STAT CG4 LACTIC ACID, ED
LACTIC ACID, VENOUS: 1.13 mmol/L (ref 0.5–1.9)
Lactic Acid, Venous: 1.01 mmol/L (ref 0.5–1.9)

## 2017-10-15 LAB — COMPREHENSIVE METABOLIC PANEL
ALBUMIN: 3 g/dL — AB (ref 3.5–5.0)
ALK PHOS: 72 U/L (ref 38–126)
ALT: 31 U/L (ref 0–44)
ANION GAP: 12 (ref 5–15)
AST: 16 U/L (ref 15–41)
BUN: 48 mg/dL — ABNORMAL HIGH (ref 8–23)
CHLORIDE: 101 mmol/L (ref 98–111)
CO2: 33 mmol/L — AB (ref 22–32)
Calcium: 9.6 mg/dL (ref 8.9–10.3)
Creatinine, Ser: 1.55 mg/dL — ABNORMAL HIGH (ref 0.44–1.00)
GFR calc non Af Amer: 27 mL/min — ABNORMAL LOW (ref >60)
GFR, EST AFRICAN AMERICAN: 31 mL/min — AB (ref >60)
GLUCOSE: 131 mg/dL — AB (ref 70–99)
Potassium: 3.3 mmol/L — ABNORMAL LOW (ref 3.5–5.1)
SODIUM: 146 mmol/L — AB (ref 135–145)
Total Bilirubin: 1.6 mg/dL — ABNORMAL HIGH (ref 0.3–1.2)
Total Protein: 5.7 g/dL — ABNORMAL LOW (ref 6.5–8.1)

## 2017-10-15 LAB — URINALYSIS, ROUTINE W REFLEX MICROSCOPIC
BILIRUBIN URINE: NEGATIVE
GLUCOSE, UA: NEGATIVE mg/dL
HGB URINE DIPSTICK: NEGATIVE
Ketones, ur: NEGATIVE mg/dL
Leukocytes, UA: NEGATIVE
Nitrite: NEGATIVE
PH: 7 (ref 5.0–8.0)
Protein, ur: NEGATIVE mg/dL
SPECIFIC GRAVITY, URINE: 1.008 (ref 1.005–1.030)

## 2017-10-15 LAB — CBC WITH DIFFERENTIAL/PLATELET
ABS IMMATURE GRANULOCYTES: 0.4 10*3/uL — AB (ref 0.0–0.1)
Basophils Absolute: 0.1 10*3/uL (ref 0.0–0.1)
Basophils Relative: 0 %
Eosinophils Absolute: 0 10*3/uL (ref 0.0–0.7)
Eosinophils Relative: 0 %
HEMATOCRIT: 41.2 % (ref 36.0–46.0)
HEMOGLOBIN: 12.5 g/dL (ref 12.0–15.0)
IMMATURE GRANULOCYTES: 2 %
LYMPHS ABS: 0.7 10*3/uL (ref 0.7–4.0)
LYMPHS PCT: 3 %
MCH: 27.1 pg (ref 26.0–34.0)
MCHC: 30.3 g/dL (ref 30.0–36.0)
MCV: 89.4 fL (ref 78.0–100.0)
MONOS PCT: 7 %
Monocytes Absolute: 1.7 10*3/uL — ABNORMAL HIGH (ref 0.1–1.0)
NEUTROS ABS: 20.7 10*3/uL — AB (ref 1.7–7.7)
NEUTROS PCT: 88 %
Platelets: 231 10*3/uL (ref 150–400)
RBC: 4.61 MIL/uL (ref 3.87–5.11)
RDW: 18.7 % — ABNORMAL HIGH (ref 11.5–15.5)
WBC: 23.7 10*3/uL — AB (ref 4.0–10.5)

## 2017-10-15 LAB — PROTIME-INR
INR: 2.36
Prothrombin Time: 25.7 seconds — ABNORMAL HIGH (ref 11.4–15.2)

## 2017-10-15 LAB — STREP PNEUMONIAE URINARY ANTIGEN: Strep Pneumo Urinary Antigen: NEGATIVE

## 2017-10-15 LAB — I-STAT TROPONIN, ED: Troponin i, poc: 0.04 ng/mL (ref 0.00–0.08)

## 2017-10-15 LAB — MAGNESIUM: Magnesium: 1.9 mg/dL (ref 1.7–2.4)

## 2017-10-15 LAB — BRAIN NATRIURETIC PEPTIDE: B Natriuretic Peptide: 1050.5 pg/mL — ABNORMAL HIGH (ref 0.0–100.0)

## 2017-10-15 MED ORDER — CHOLECALCIFEROL 10 MCG (400 UNIT) PO TABS
400.0000 [IU] | ORAL_TABLET | Freq: Every day | ORAL | Status: DC
  Administered 2017-10-15 – 2017-10-22 (×8): 400 [IU] via ORAL
  Filled 2017-10-15 (×8): qty 1

## 2017-10-15 MED ORDER — IPRATROPIUM-ALBUTEROL 0.5-2.5 (3) MG/3ML IN SOLN
3.0000 mL | Freq: Once | RESPIRATORY_TRACT | Status: AC
  Administered 2017-10-15: 3 mL via RESPIRATORY_TRACT
  Filled 2017-10-15: qty 3

## 2017-10-15 MED ORDER — SODIUM CHLORIDE 0.9 % IV BOLUS
500.0000 mL | Freq: Once | INTRAVENOUS | Status: AC
  Administered 2017-10-15: 500 mL via INTRAVENOUS

## 2017-10-15 MED ORDER — WARFARIN SODIUM 2 MG PO TABS
2.0000 mg | ORAL_TABLET | Freq: Once | ORAL | Status: AC
  Administered 2017-10-15: 2 mg via ORAL
  Filled 2017-10-15 (×2): qty 1

## 2017-10-15 MED ORDER — ONDANSETRON HCL 4 MG PO TABS: 4.0000 mg | ORAL_TABLET | ORAL | Status: DC | PRN

## 2017-10-15 MED ORDER — METOLAZONE 2.5 MG PO TABS: 2.5000 mg | ORAL_TABLET | ORAL | Status: DC | PRN

## 2017-10-15 MED ORDER — TIMOLOL MALEATE 0.5 % OP SOLN
1.0000 [drp] | Freq: Every day | OPHTHALMIC | Status: DC
  Administered 2017-10-15 – 2017-10-22 (×8): 1 [drp] via OPHTHALMIC
  Filled 2017-10-15: qty 5

## 2017-10-15 MED ORDER — SENNOSIDES-DOCUSATE SODIUM 8.6-50 MG PO TABS
1.0000 | ORAL_TABLET | Freq: Every evening | ORAL | Status: DC | PRN
  Administered 2017-10-15: 1 via ORAL
  Filled 2017-10-15: qty 1

## 2017-10-15 MED ORDER — POTASSIUM CHLORIDE CRYS ER 10 MEQ PO TBCR
10.0000 meq | EXTENDED_RELEASE_TABLET | Freq: Every day | ORAL | Status: DC
Start: 2017-10-15 — End: 2017-10-22
  Administered 2017-10-16 – 2017-10-22 (×7): 10 meq via ORAL
  Filled 2017-10-15 (×7): qty 1

## 2017-10-15 MED ORDER — SODIUM CHLORIDE 0.9 % IV SOLN
1.0000 g | Freq: Once | INTRAVENOUS | Status: AC
  Administered 2017-10-15: 1 g via INTRAVENOUS
  Filled 2017-10-15: qty 1

## 2017-10-15 MED ORDER — ALBUTEROL SULFATE (2.5 MG/3ML) 0.083% IN NEBU: 2.5000 mg | INHALATION_SOLUTION | RESPIRATORY_TRACT | Status: DC | PRN

## 2017-10-15 MED ORDER — ATORVASTATIN CALCIUM 10 MG PO TABS
10.0000 mg | ORAL_TABLET | Freq: Every day | ORAL | Status: DC
  Administered 2017-10-15 – 2017-10-22 (×8): 10 mg via ORAL
  Filled 2017-10-15 (×8): qty 1

## 2017-10-15 MED ORDER — WARFARIN - PHARMACIST DOSING INPATIENT
Freq: Every day | Status: DC
  Administered 2017-10-16: 18:00:00

## 2017-10-15 MED ORDER — ACETAMINOPHEN 325 MG PO TABS: 650.0000 mg | ORAL_TABLET | Freq: Four times a day (QID) | ORAL | Status: DC | PRN

## 2017-10-15 MED ORDER — POTASSIUM CHLORIDE CRYS ER 20 MEQ PO TBCR
40.0000 meq | EXTENDED_RELEASE_TABLET | Freq: Once | ORAL | Status: AC
  Administered 2017-10-15: 40 meq via ORAL
  Filled 2017-10-15: qty 2

## 2017-10-15 MED ORDER — HYDROCODONE-ACETAMINOPHEN 5-325 MG PO TABS: 1.0000 | ORAL_TABLET | ORAL | Status: DC | PRN

## 2017-10-15 MED ORDER — ALLOPURINOL 300 MG PO TABS
150.0000 mg | ORAL_TABLET | Freq: Every day | ORAL | Status: DC
  Administered 2017-10-15 – 2017-10-19 (×5): 150 mg via ORAL
  Filled 2017-10-15 (×5): qty 1

## 2017-10-15 MED ORDER — ISOSORBIDE MONONITRATE ER 30 MG PO TB24
30.0000 mg | ORAL_TABLET | Freq: Every day | ORAL | Status: DC
  Administered 2017-10-15 – 2017-10-22 (×8): 30 mg via ORAL
  Filled 2017-10-15 (×8): qty 1

## 2017-10-15 MED ORDER — ALBUTEROL SULFATE (2.5 MG/3ML) 0.083% IN NEBU
2.5000 mg | INHALATION_SOLUTION | Freq: Four times a day (QID) | RESPIRATORY_TRACT | Status: DC
  Administered 2017-10-15 (×2): 2.5 mg via RESPIRATORY_TRACT
  Filled 2017-10-15 (×2): qty 3

## 2017-10-15 MED ORDER — VANCOMYCIN HCL IN DEXTROSE 1-5 GM/200ML-% IV SOLN
1000.0000 mg | Freq: Once | INTRAVENOUS | Status: AC
  Administered 2017-10-15: 1000 mg via INTRAVENOUS
  Filled 2017-10-15: qty 200

## 2017-10-15 MED ORDER — ONDANSETRON HCL 4 MG PO TABS
4.0000 mg | ORAL_TABLET | Freq: Four times a day (QID) | ORAL | Status: DC | PRN
  Administered 2017-10-22: 4 mg via ORAL
  Filled 2017-10-15: qty 1

## 2017-10-15 MED ORDER — LATANOPROST 0.005 % OP SOLN
1.0000 [drp] | Freq: Every day | OPHTHALMIC | Status: DC
  Administered 2017-10-15 – 2017-10-21 (×7): 1 [drp] via OPHTHALMIC
  Filled 2017-10-15: qty 2.5

## 2017-10-15 MED ORDER — SODIUM CHLORIDE 0.9 % IV SOLN
500.0000 mg | Freq: Once | INTRAVENOUS | Status: AC
  Administered 2017-10-15: 500 mg via INTRAVENOUS
  Filled 2017-10-15: qty 500

## 2017-10-15 MED ORDER — SPIRONOLACTONE 25 MG PO TABS
25.0000 mg | ORAL_TABLET | Freq: Every day | ORAL | Status: DC
Start: 2017-10-15 — End: 2017-10-16
  Administered 2017-10-15 – 2017-10-16 (×2): 25 mg via ORAL
  Filled 2017-10-15 (×2): qty 1

## 2017-10-15 MED ORDER — TRAMADOL HCL 50 MG PO TABS: 50.0000 mg | ORAL_TABLET | Freq: Three times a day (TID) | ORAL | Status: DC | PRN

## 2017-10-15 MED ORDER — BRIMONIDINE TARTRATE 0.15 % OP SOLN
1.0000 [drp] | Freq: Three times a day (TID) | OPHTHALMIC | Status: DC
  Administered 2017-10-15 – 2017-10-22 (×21): 1 [drp] via OPHTHALMIC
  Filled 2017-10-15: qty 5

## 2017-10-15 MED ORDER — DEXTROSE 5 % IV SOLN
0.5000 g | Freq: Three times a day (TID) | INTRAVENOUS | Status: DC
  Administered 2017-10-15 – 2017-10-17 (×6): 0.5 g via INTRAVENOUS
  Filled 2017-10-15 (×8): qty 0.5

## 2017-10-15 MED ORDER — ACETAMINOPHEN 650 MG RE SUPP: 650.0000 mg | Freq: Four times a day (QID) | RECTAL | Status: DC | PRN

## 2017-10-15 MED ORDER — DILTIAZEM HCL ER COATED BEADS 120 MG PO CP24
120.0000 mg | ORAL_CAPSULE | Freq: Every day | ORAL | Status: DC
  Administered 2017-10-15 – 2017-10-22 (×8): 120 mg via ORAL
  Filled 2017-10-15 (×8): qty 1

## 2017-10-15 MED ORDER — ONDANSETRON HCL 4 MG/2ML IJ SOLN
4.0000 mg | Freq: Four times a day (QID) | INTRAMUSCULAR | Status: DC | PRN
  Administered 2017-10-18: 4 mg via INTRAVENOUS
  Filled 2017-10-15: qty 2

## 2017-10-15 MED ORDER — METOPROLOL TARTRATE 50 MG PO TABS
50.0000 mg | ORAL_TABLET | Freq: Two times a day (BID) | ORAL | Status: DC
  Administered 2017-10-15 – 2017-10-19 (×8): 50 mg via ORAL
  Filled 2017-10-15 (×9): qty 1

## 2017-10-15 MED ORDER — VANCOMYCIN HCL IN DEXTROSE 750-5 MG/150ML-% IV SOLN
750.0000 mg | INTRAVENOUS | Status: DC
  Administered 2017-10-17: 750 mg via INTRAVENOUS
  Filled 2017-10-15 (×2): qty 150

## 2017-10-15 MED ORDER — FUROSEMIDE 80 MG PO TABS
80.0000 mg | ORAL_TABLET | Freq: Every day | ORAL | Status: DC
  Administered 2017-10-15 – 2017-10-16 (×2): 80 mg via ORAL
  Filled 2017-10-15 (×2): qty 1

## 2017-10-15 MED ORDER — ALBUTEROL SULFATE (2.5 MG/3ML) 0.083% IN NEBU
2.5000 mg | INHALATION_SOLUTION | Freq: Three times a day (TID) | RESPIRATORY_TRACT | Status: DC
  Filled 2017-10-15: qty 3

## 2017-10-15 MED ORDER — LEVOTHYROXINE SODIUM 88 MCG PO TABS
88.0000 ug | ORAL_TABLET | Freq: Every day | ORAL | Status: DC
Start: 2017-10-16 — End: 2017-10-22
  Administered 2017-10-16 – 2017-10-22 (×7): 88 ug via ORAL
  Filled 2017-10-15 (×7): qty 1

## 2017-10-15 MED ORDER — PREDNISONE 20 MG PO TABS
20.0000 mg | ORAL_TABLET | Freq: Every day | ORAL | Status: AC
  Administered 2017-10-15 – 2017-10-16 (×2): 20 mg via ORAL
  Filled 2017-10-15 (×2): qty 1

## 2017-10-15 MED ORDER — SODIUM CHLORIDE 0.9 % IV SOLN: 2.0000 g | Freq: Three times a day (TID) | INTRAVENOUS | Status: DC

## 2017-10-15 NOTE — ED Notes (Signed)
ED Provider at bedside. 

## 2017-10-15 NOTE — Progress Notes (Signed)
ANTICOAGULATION CONSULT NOTE - Initial Consult  Pharmacy Consult for warfarin Indication: atrial fibrillation  Allergies  Allergen Reactions  . Penicillins Hives and Swelling    Has patient had a PCN reaction causing immediate rash, facial/tongue/throat swelling, SOB or lightheadedness with hypotension: Yes Has patient had a PCN reaction causing severe rash involving mucus membranes or skin necrosis: Yes Has patient had a PCN reaction that required hospitalization No Has patient had a PCN reaction occurring within the last 10 years: No If all of the above answers are "NO", then may proceed with Cephalosporin use.    . Sulfa Antibiotics Anaphylaxis and Swelling  . Fish Allergy Nausea And Vomiting    Patient Measurements: Weight: 130 lb (59 kg)  Vital Signs: Temp: 98.5 F (36.9 C) (07/24 0915) Temp Source: Oral (07/24 0915) BP: 129/62 (07/24 1230) Pulse Rate: 85 (07/24 1230)  Labs: Recent Labs    10/15/17 0951  HGB 12.5  HCT 41.2  PLT 231  LABPROT 25.7*  INR 2.36  CREATININE 1.55*    Estimated Creatinine Clearance: 18.3 mL/min (A) (by C-G formula based on SCr of 1.55 mg/dL (H)).   Medical History: Past Medical History:  Diagnosis Date  . Chronic a-fib (HCC)   . Chronic back pain   . Chronic systolic (congestive) heart failure (HCC)    reduced EF since 2018 to 35-40%  . CKD (chronic kidney disease) stage 4, GFR 15-29 ml/min (HCC) 09/11/2015  . Hyperlipidemia   . Hypertension   . Hypothyroidism   . Pulmonary hypertension (HCC)    PASP 65 mmHg at Echo 3/17  . Thyroid disease     Assessment: [age] yof presented to the ED with worsening SOB. She is on chronic warfarin for history of afib. INR is therapeutic. However, she has been on doxycyline and pt received a dose of azithromycin today. These can increase the INR. CBC is WNL and no bleeding noted.   Goal of Therapy:  INR 2-3 Monitor platelets by anticoagulation protocol: Yes   Plan:  Warfarin 2mg  PO x 1  tonight Daily INR  Berda Shelvin, Drake Leachachel Lynn 10/15/2017,1:11 PM

## 2017-10-15 NOTE — ED Provider Notes (Signed)
[facility] EMERGENCY DEPARTMENT Provider Note   CSN: [id number] Arrival date & time: 10/15/17  1478    History   Chief Complaint Chief Complaint  Patient presents with  . Shortness of Breath    HPI Toneisha N Brindley is a 84 y.o. female with a history of systolic HF (EF 35-40%), CAD, afib, CVA, CKD, and hypothyroidism who presents with 2-3 weeks of cough and one day of shortness of breath. Ms. Cullens was recently hospitalized for rhinovirus bronchitis with symptoms of runny nose, sore throat, and cough. During her hospitalization, she was evaluated for weakness and confusion with a brain MRI that showed subacute stroke. She passed her swallow eval and was eventually discharged home on 7/17 with Robitussin, Xopenex nebs, Dulera, and a prednisone taper. She reports that her cough never went away and is now getting worse with no relief from the anti-tussives and breathing treatments. She reports a constant, non-productive cough that makes her feel short of breath and weak. She also gets chest pain and back pain when she is coughing. She began to feel short of breath earlier today. She denies fever, orthopnea, focal weakness, dysphagia, emesis, and lower extremity swelling.   Past Medical History:  Diagnosis Date  . Chronic a-fib (HCC)   . Chronic back pain   . Chronic systolic (congestive) heart failure (HCC)    reduced EF since 2018 to 35-40%  . CKD (chronic kidney disease) stage 4, GFR 15-29 ml/min (HCC) 09/11/2015  . Hyperlipidemia   . Hypertension   . Hypothyroidism   . Pulmonary hypertension (HCC)    PASP 65 mmHg at Echo 3/17  . Thyroid disease     Patient Active Problem List   Diagnosis Date Noted  . Cerebral embolism with cerebral infarction 10/07/2017  . Pressure injury of skin 10/05/2017  . Acute bronchitis 10/05/2017  . Viral bronchitis 10/04/2017  . Acute on chronic systolic heart failure (HCC) 07/09/2017  . Atrial fibrillation with RVR (HCC)  07/09/2017  . Hypothyroidism, adult 07/09/2017  . Pulmonary HTN (HCC) 07/09/2017  . Leukocytosis 07/09/2017  . Acute respiratory failure with hypoxia (HCC) 07/09/2017  . High blood pressure 07/09/2017  . Chronic systolic heart failure (HCC)   . Acute on chronic right heart failure (HCC) 12/22/2016  . CKD (chronic kidney disease), stage III w/ renal artery stenosis 12/22/2016  . Cellulitis of lower extremity 12/22/2016  . Chronic a-fib (HCC) 12/22/2016  . Hypothyroidism 12/22/2016  . Acute diastolic heart failure (HCC) 12/22/2016  . Elevated troponin 12/22/2016  . Acute hypokalemia 12/22/2016  . HTN (hypertension) 12/22/2016  . Dyslipidemia 12/22/2016  . Pulmonary hypertension, moderate to severe (HCC) 12/22/2016  . Abnormal urinalysis 12/22/2016  . Hypertensive heart disease 05/08/2016  . Chronic diastolic heart failure (HCC) 11/28/2015  . Anticoagulated 11/28/2015  . CKD (chronic kidney disease) stage 3, GFR 30-59 ml/min (HCC) 09/11/2015  . CKD (chronic kidney disease) stage 4, GFR 15-29 ml/min (HCC) 09/11/2015  . Pulmonary hypertension (HCC)   . Pleural effusion on left   . Peripheral edema   . Hypokalemia   . Anasarca 08/10/2015  . Acute on chronic respiratory failure with hypoxia (HCC)   . Pleural effusion   . Physical deconditioning   . Cough   . HCAP (healthcare-associated pneumonia)   . Chest congestion   . SOB (shortness of breath)   . Swelling   . Acute on chronic diastolic heart failure (HCC)   . HLD (hyperlipidemia)   . Influenza due to identified novel influenza  A virus with other respiratory manifestations 06/07/2015  . Pleural effusion, bacterial   . Dyspnea   . AKI (acute kidney injury) (HCC)   . CAP (community acquired pneumonia) 05/30/2015  . Renal artery stenosis (HCC) 08/08/2014  . Encounter for therapeutic drug monitoring 04/21/2013  . Hypertension 06/25/2012  . Hyperlipidemia 06/25/2012  . Atrial fibrillation (HCC) 06/25/2012  . Compression  fracture of lumbar spine, non-traumatic (HCC) 06/25/2012  . Chronic back pain     Past Surgical History:  Procedure Laterality Date  . APPENDECTOMY    . cataracts     bilateral  . EYE SURGERY     left eye "hole"  . IR KYPHO THORACIC WITH BONE BIOPSY  09/26/2017  . IR RADIOLOGIST EVAL & MGMT  09/18/2017  . TONSILLECTOMY       OB History   None      Home Medications    Prior to Admission medications   Medication Sig Start Date End Date Taking? Authorizing Provider  acetaminophen (TYLENOL) 500 MG tablet Take 1 tablet (500 mg total) by mouth every 6 (six) hours as needed (pain). 10/08/17  Yes Rai, Ripudeep K, MD  allopurinol (ZYLOPRIM) 300 MG tablet Take 150 mg by mouth daily.    Yes [provider]  atorvastatin (LIPITOR) 10 MG tablet Take 10 mg by mouth daily.   Yes [provider]  B Complex Vitamins (VITAMIN B COMPLEX) TABS Take 1 tablet by mouth daily with lunch.   Yes [provider]  brimonidine (ALPHAGAN) 0.15 % ophthalmic solution Place 1 drop into both eyes 3 (three) times daily.   Yes [provider]  cholecalciferol (VITAMIN D) 400 units TABS tablet Take 400 Units by mouth daily.   Yes [provider]  diltiazem (CARDIZEM CD) 120 MG 24 hr capsule Take 1 capsule (120 mg total) by mouth daily. 08/26/17  Yes Corky Crafts, MD  doxycycline (VIBRA-TABS) 100 MG tablet Take 1 tablet (100 mg total) by mouth 2 (two) times daily for 7 days. 10/08/17 10/15/17 Yes Rai, Ripudeep K, MD  furosemide (LASIX) 80 MG tablet TAKE 1 TABLET BY MOUTH EVERY DAY 04/28/17  Yes Corky Crafts, MD  guaiFENesin-dextromethorphan [facility] DM) 100-10 MG/5ML syrup Take 5 mLs by mouth every 4 (four) hours as needed for cough. 10/08/17  Yes Rai, Ripudeep K, MD  isosorbide mononitrate (IMDUR) 30 MG 24 hr tablet Take 1 tablet (30 mg total) by mouth daily. 09/05/17  Yes Corky Crafts, MD  latanoprost (XALATAN) 0.005 % ophthalmic solution Place 1 drop into  both eyes at bedtime. 01/10/14  Yes [provider]  levalbuterol (XOPENEX) 0.63 MG/3ML nebulizer solution Take 3 mLs (0.63 mg total) by nebulization 3 (three) times daily for 20 days. And q4hours as needed for SOB/wheezing Patient taking differently: Take 0.63 mg by nebulization See admin instructions. Take 3 mls by Nebulization three times daily for 20 days, then change to every 4 hours as needed for shortness of breath and wheezing 10/08/17 10/28/17 Yes Rai, Ripudeep K, MD  levothyroxine (SYNTHROID, LEVOTHROID) 88 MCG tablet Take 88 mcg by mouth daily before breakfast.   Yes [provider]  metolazone (ZAROXOLYN) 2.5 MG tablet Take 1 tablet (2.5 mg total) by mouth as needed (take if weight goes up 3 lbs in 1 day or 5 lbs in 1 week). 10/27/15  Yes Weaver, Scott T, PA-C  metoprolol tartrate (LOPRESSOR) 50 MG tablet Take 1 tablet (50 mg total) by mouth 2 (two) times daily. 10/08/17  Yes  Rai, Ripudeep K, MD  mometasone-formoterol (DULERA) 100-5 MCG/ACT AERO Inhale 2 puffs into the lungs 2 (two) times daily. 10/08/17  Yes Rai, Ripudeep K, MD  ondansetron (ZOFRAN) 4 MG tablet Take 4 mg by mouth every 4 (four) hours as needed for nausea or vomiting.   Yes [provider]  OVER THE COUNTER MEDICATION Take 240 mLs by mouth 2 (two) times daily. Med pass 2.0   Yes [provider]  OXYGEN Inhale 2 L into the lungs See admin instructions. To keep stats greater than 90% check sats every shift   Yes [provider]  Polyethyl Glycol-Propyl Glycol (SYSTANE OP) Apply 2 drops to eye daily as needed (dry eyes).    Yes [provider]  potassium chloride SA (K-DUR,KLOR-CON) 20 MEQ tablet Take 0.5 tablets (10 mEq total) by mouth daily. 07/14/17  Yes Elgergawy, Leana Roe, MD  predniSONE (DELTASONE) 10 MG tablet Prednisone dosing: Take  Prednisone 40mg  (4 tabs) x 2 days, then taper to 30mg  (3 tabs) x 3 days, then 20mg  (2 tabs) x 3days, then 10mg  (1 tab) x 3days, then OFF.  10/08/17  Yes Rai, Ripudeep K, MD  timolol (TIMOPTIC) 0.5 % ophthalmic solution Place 1 drop into both eyes daily.  12/28/13  Yes [provider]  traMADol (ULTRAM) 50 MG tablet Take 50 mg by mouth every 8 (eight) hours as needed for moderate pain.    Yes [provider]  spironolactone (ALDACTONE) 25 MG tablet Take 25 mg by mouth daily.    [provider]  warfarin (COUMADIN) 1 MG tablet Take 2.5 tablets daily as directed by Coumadin clinic Patient taking differently: Take 2.5 mg by mouth daily after supper. or as directed by Coumadin clinic 09/17/17   Corky Crafts, MD  warfarin (COUMADIN) 2 MG tablet Take 2 mg by mouth at bedtime.    [provider]    Family History Family History  Problem Relation Age of Onset  . Heart disease Sister   . Heart disease Sister   . Cancer Mother   . Other Father   . Heart attack Father     Social History Social History   Tobacco Use  . Smoking status: Never Smoker  . Smokeless tobacco: Never Used  Substance Use Topics  . Alcohol use: No  . Drug use: No     Allergies   Penicillins; Sulfa antibiotics; and Fish allergy   Review of Systems Review of Systems  Constitutional: Negative for chills and fever.  HENT: Negative for rhinorrhea, sore throat and trouble swallowing.   Eyes: Negative for visual disturbance.  Respiratory: Positive for cough and shortness of breath. Negative for choking.   Cardiovascular: Positive for chest pain. Negative for leg swelling.  Gastrointestinal: Positive for nausea. Negative for abdominal pain and vomiting.  Genitourinary: Negative for difficulty urinating and dysuria.  Musculoskeletal: Positive for back pain. Negative for neck pain.  Skin: Negative for rash and wound.  Neurological: Positive for weakness. Negative for dizziness and headaches.     Physical Exam Updated Vital Signs BP (!) 113/96   Pulse 86   Temp 98.5 F (36.9 C) (Oral)   Resp (!) 25   Wt 59  kg (130 lb)   SpO2 99% Comment: Simultaneous filing. User may not have seen previous data.  BMI 22.31 kg/m   Physical Exam  Constitutional: She is oriented to person, place, and time. She appears well-developed and well-nourished. She does not appear ill.  HENT:  Head: Normocephalic and  atraumatic.  Mouth/Throat: Oropharynx is clear and moist.  Eyes: Pupils are equal, round, and reactive to light. EOM are normal.  Neck: Normal range of motion. No JVD present.  Cardiovascular: Normal rate and regular rhythm. Exam reveals no gallop and no friction rub.  No murmur heard. Pulmonary/Chest: Effort normal. Tachypnea noted. No respiratory distress.  Wet cough noted. Bilateral crackles on auscultation.  Abdominal: Soft. Bowel sounds are normal. There is no tenderness.  Musculoskeletal:       Right lower leg: She exhibits no edema.       Left lower leg: She exhibits no edema.  Neurological: She is alert and oriented to person, place, and time.  Skin: Skin is warm and dry. Capillary refill takes less than 2 seconds. No rash noted.  Psychiatric: She has a normal mood and affect. Her behavior is normal.     ED Treatments / Results  Labs (all labs ordered are listed, but only abnormal results are displayed) Labs Reviewed  COMPREHENSIVE METABOLIC PANEL - Abnormal; Notable for the following components:      Result Value   Sodium 146 (*)    Potassium 3.3 (*)    CO2 33 (*)    Glucose, Bld 131 (*)    BUN 48 (*)    Creatinine, Ser 1.55 (*)    Total Protein 5.7 (*)    Albumin 3.0 (*)    Total Bilirubin 1.6 (*)    GFR calc non Af Amer 27 (*)    GFR calc Af Amer 31 (*)    All other components within normal limits  CBC WITH DIFFERENTIAL/PLATELET - Abnormal; Notable for the following components:   WBC 23.7 (*)    RDW 18.7 (*)    Neutro Abs 20.7 (*)    Monocytes Absolute 1.7 (*)    Abs Immature Granulocytes 0.4 (*)    All other components within normal limits  PROTIME-INR - Abnormal; Notable  for the following components:   Prothrombin Time 25.7 (*)    All other components within normal limits  URINALYSIS, ROUTINE W REFLEX MICROSCOPIC - Abnormal; Notable for the following components:   Color, Urine STRAW (*)    All other components within normal limits  BRAIN NATRIURETIC PEPTIDE - Abnormal; Notable for the following components:   B Natriuretic Peptide 1,050.5 (*)    All other components within normal limits  CULTURE, BLOOD (ROUTINE X 2)  CULTURE, BLOOD (ROUTINE X 2)  I-STAT CG4 LACTIC ACID, ED  I-STAT CG4 LACTIC ACID, ED  I-STAT TROPONIN, ED  I-STAT CG4 LACTIC ACID, ED    EKG None  Radiology Dg Chest 2 View  Result Date: 10/15/2017 CLINICAL DATA:  2-3 week history of productive cough. Chest pain related to coughing. Current history of hypertension and CHF. EXAM: CHEST - 2 VIEW COMPARISON:  10/04/2017, 09/29/2017 and earlier. FINDINGS: Cardiac silhouette markedly enlarged, unchanged. Thoracic aorta atherosclerotic, unchanged. Hilar and mediastinal contours otherwise unremarkable. Stable moderate-sized chronic BILATERAL pleural effusions. Worsening aeration in the lower lobes since the examination 11 days ago. Prominent bronchovascular markings diffusely and moderate central peribronchial thickening, unchanged. Pulmonary vascularity normal. Exaggeration of the usual thoracic kyphosis. Prior augmentation of a mid thoracic vertebral body. IMPRESSION: 1. Stable moderate-sized chronic BILATERAL pleural effusions. 2. Worsening aeration in the lower lobes (LEFT greater than RIGHT). This likely reflects superimposed passive atelectasis, although pneumonia is not excluded given the productive cough. 3. Stable cardiomegaly without evidence of pulmonary edema. 4. Stable moderate changes of chronic bronchitis and/or asthma. Electronically Signed  By: Hulan Saas M.D.   On: 10/15/2017 10:53    Procedures Procedures (including critical care time)  Medications Ordered in  ED Medications  aztreonam (AZACTAM) 0.5 g in dextrose 5 % 50 mL IVPB (has no administration in time range)  acetaminophen (TYLENOL) tablet 650 mg (has no administration in time range)    Or  acetaminophen (TYLENOL) suppository 650 mg (has no administration in time range)  HYDROcodone-acetaminophen (NORCO/VICODIN) 5-325 MG per tablet 1-2 tablet (has no administration in time range)  ondansetron (ZOFRAN) tablet 4 mg (has no administration in time range)    Or  ondansetron (ZOFRAN) injection 4 mg (has no administration in time range)  senna-docusate (Senokot-S) tablet 1 tablet (has no administration in time range)  albuterol (PROVENTIL) (2.5 MG/3ML) 0.083% nebulizer solution 2.5 mg (2.5 mg Nebulization Given 10/15/17 1314)  albuterol (PROVENTIL) (2.5 MG/3ML) 0.083% nebulizer solution 2.5 mg (has no administration in time range)  allopurinol (ZYLOPRIM) tablet 150 mg (has no administration in time range)  atorvastatin (LIPITOR) tablet 10 mg (has no administration in time range)  brimonidine (ALPHAGAN) 0.15 % ophthalmic solution 1 drop (has no administration in time range)  cholecalciferol (VITAMIN D) tablet 400 Units (has no administration in time range)  diltiazem (CARDIZEM CD) 24 hr capsule 120 mg (has no administration in time range)  furosemide (LASIX) tablet 80 mg (has no administration in time range)  isosorbide mononitrate (IMDUR) 24 hr tablet 30 mg (has no administration in time range)  latanoprost (XALATAN) 0.005 % ophthalmic solution 1 drop (has no administration in time range)  levothyroxine (SYNTHROID, LEVOTHROID) tablet 88 mcg (has no administration in time range)  metolazone (ZAROXOLYN) tablet 2.5 mg (has no administration in time range)  metoprolol tartrate (LOPRESSOR) tablet 50 mg (has no administration in time range)  potassium chloride (K-DUR,KLOR-CON) CR tablet 10 mEq (has no administration in time range)  predniSONE (DELTASONE) tablet 20 mg (has no administration in time range)   spironolactone (ALDACTONE) tablet 25 mg (has no administration in time range)  timolol (TIMOPTIC) 0.5 % ophthalmic solution 1 drop (has no administration in time range)  traMADol (ULTRAM) tablet 50 mg (has no administration in time range)  Warfarin - Pharmacist Dosing Inpatient (has no administration in time range)  warfarin (COUMADIN) tablet 2 mg (has no administration in time range)  ipratropium-albuterol (DUONEB) 0.5-2.5 (3) MG/3ML nebulizer solution 3 mL (3 mLs Nebulization Given 10/15/17 1012)  aztreonam (AZACTAM) 1 g in sodium chloride 0.9 % 100 mL IVPB (0 g Intravenous Stopped 10/15/17 1206)  vancomycin (VANCOCIN) IVPB 1000 mg/200 mL premix (0 mg Intravenous Stopped 10/15/17 1314)  azithromycin (ZITHROMAX) 500 mg in sodium chloride 0.9 % 250 mL IVPB (0 mg Intravenous Stopped 10/15/17 1253)  sodium chloride 0.9 % bolus 500 mL (0 mLs Intravenous Stopped 10/15/17 1239)     Initial Impression / Assessment and Plan / ED Course  I have reviewed the triage vital signs and the nursing notes.  Pertinent labs & imaging results that were available during my care of the patient were reviewed by me and considered in my medical decision making (see chart for details).  Nery CELINES FEMIA is a 84 y.o. female with a history of systolic HF (EF 35-40%), CAD, afib, CVA, CKD, and hypothyroidism who presents with 2-3 weeks of cough and one day of shortness of breath. Upon arrival to the ED, patient was afebrile, normotensive, but hypoxic to 88%. Saturation improved with 2L oxygen . Physical exam was significant for wet cough and bilateral diffuse lung  crackles. No focal neurologic deficits. Labs were significant for leukocytosis to 23.7, K 3.3, BUN 48, Cr 1.55 (stable), INR 2.36, negative troponin, lactate 1.01, and BNP 1050. Blood cultures pending. EKG showed afib with some t-wave flattening. CXR showed stable chronic bilateral pleural effusions, worsening aeration in the lower lobes L>R concerning for  atelectasis vs. pneumonia, and no pulmonary edema. Given leukocytosis, hypoxia, and cough, patient started on vancomycin, aztreonam, and azithromycin for hospital acquired pneumonia. She also received 500cc NS. Her systolic CHF may be contributing to her dyspnea as well. Although the patient doesn't appear volume overloaded on exam, have pulmonary edema on CXR, or endorse orthopnea, her BNP is elevated today. Patient warrants inpatient admission for sepsis likely secondary to HAP (given recent hospitalization). Patient accepted to hospitalist service.  Final Clinical Impressions(s) / ED Diagnoses   Final diagnoses:  Hypoxia  Congestive heart failure, unspecified HF chronicity, unspecified heart failure type (HCC)  Acute on chronic respiratory failure with hypoxia [facility](HCC)    ED Discharge Orders    None       Dorrell, Cathleen Cortieborah N, MD 10/15/17 1336    Mesner, Barbara CowerJason, MD 10/17/17 1520

## 2017-10-15 NOTE — Progress Notes (Signed)
Pt arrived from ED via stretcher. Pt A&O x4, Very hard of hearing, calm & cooperative, pt is audibly congested. There is a non-productive cough. Pt. advised she had not eaten since yesterday (10/14/17), RN provided peanut butter and crackers until dinner meal arrived. Orders received. Will continue to monitor.

## 2017-10-15 NOTE — Progress Notes (Signed)
Pharmacy Antibiotic Note  Tonya Farley is a 84 y.o. female admitted on 10/15/2017 with pneumonia and sepsis.  Pharmacy has been consulted for vancomycin dosing.  Received one time dose of aztreonam and vancomycin in ED. WBC 23.7, LA 1.13, afebrile. Scr 1.55 (CrCl ~18 mL/min).   Plan: Vancomycin 750 mg  IV every 48 hours.  Goal trough 15-20 mcg/mL.  Continue aztreonam 0.5 g IV every 8 hours Monitor renal fx, clinical pic, cx results, VT as indicated  Weight: 130 lb (59 kg)  Temp (24hrs), Avg:98.7 F (37.1 C), Min:98.5 F (36.9 C), Max:98.8 F (37.1 C)  Recent Labs  Lab 10/15/17 0951 10/15/17 1002 10/15/17 1316  WBC 23.7*  --   --   CREATININE 1.55*  --   --   LATICACIDVEN  --  1.01 1.13    Estimated Creatinine Clearance: 18.3 mL/min (A) (by C-G formula based on SCr of 1.55 mg/dL (H)).    Allergies  Allergen Reactions  . Penicillins Hives and Swelling    Has patient had a PCN reaction causing immediate rash, facial/tongue/throat swelling, SOB or lightheadedness with hypotension: Yes Has patient had a PCN reaction causing severe rash involving mucus membranes or skin necrosis: Yes Has patient had a PCN reaction that required hospitalization No Has patient had a PCN reaction occurring within the last 10 years: No If all of the above answers are "NO", then may proceed with Cephalosporin use.    . Sulfa Antibiotics Anaphylaxis and Swelling  . Fish Allergy Nausea And Vomiting    Antimicrobials this admission: Azithromycin 7/24 x1 Aztreonam 7/24 >> Vancomycin 7/24 >>   Dose adjustments this admission: N/A  Microbiology results: 724 BCx: sent  7/24 Sputum: sent   Thank you for allowing pharmacy to be a part of this patient's care.  Girard CooterKimberly Perkins, PharmD Clinical Pharmacist  Pager: [phone] Phone: [phone] 10/15/2017 3:08 PM

## 2017-10-15 NOTE — ED Notes (Signed)
Patient transported to X-ray 

## 2017-10-15 NOTE — ED Triage Notes (Signed)
PT arrives via [facility] from [facility] nursing facility. PT was discharged from hospital to [facility] last week. PT was previously living at home. PT reports worsening SOB and fatigue. CP with cough. Wet productive cough. This is the last day of doxycylcine for bronchitic. PT alert and oriented x4. O2 sats 90 % on room air. PT placed on 2L O2

## 2017-10-15 NOTE — H&P (Signed)
History and Physical    Tonya Farley:[id number] DOB: November 21, 1919 DOA: 10/15/2017  PCP: Burton Apley, MD Patient coming from: [facility]  Chief Complaint: sob  HPI: Tonya Farley is a very pleasant 84 y.o. female with medical history significant for chronic systolic heart failure, CAD, A. Fib, CVA, chronic kidney disease, hypothyroidism presents to the emergency Department chief complaint persistent worsening cough initial evaluation reveals acute respiratory failure likely related to infectious process and or mild acute on chronic heart failure. [facility] are asked to admit  Information is obtained from the patient and the chart. Patient was recently hospitalized for rhinovirus bronchitis. During that hospitalization she was evaluated for weakness and confusion with a brain MRI that showed a subacute stroke. She passed her swallow eval and was eventually discharged on July 17 with Robitussin and Xopenex nebs Dulera and a prednisone taper. She she states that she never felt any better and continued to cough even at the time of discharge. Over the last day or so she said her cough is gotten worse. She reports a constant wet sounding nonproductive cough. Last night and this morning she felt quite short of breath. She remembers the staff at the facility "shaking me last night". No reports of any fever chills diarrhea nausea vomiting. No lower extremity edema. This morning the staff at the facility found her quite lethargic short of breath and hypoxic. Of note patient was discharged July 17 without oxygen as she "didn't need it". EMS was called  ED Course: in the emergency department she's afebrile and normotensive tachycardia with tachypnea and an oxygen saturation level of 88% on room air.she was provided with supplemental oxygen and her oxygen saturation level improved to greater than 90%. Provided with gentle IV fluids and broad-spectrum antibiotics for healthcare associated  pneumonia  Review of Systems: As per HPI otherwise all other systems reviewed and are negative.   Ambulatory Status: prior to most recent hospitalization patient lived at home alone independent. She was [facility] to [facility] with the hopes of getting back home  Past Medical History:  Diagnosis Date  . Chronic a-fib (HCC)   . Chronic back pain   . Chronic systolic (congestive) heart failure (HCC)    reduced EF since 2018 to 35-40%  . CKD (chronic kidney disease) stage 4, GFR 15-29 ml/min (HCC) 09/11/2015  . Hyperlipidemia   . Hypertension   . Hypothyroidism   . Pulmonary hypertension (HCC)    PASP 65 mmHg at Echo 3/17  . Thyroid disease     Past Surgical History:  Procedure Laterality Date  . APPENDECTOMY    . cataracts     bilateral  . EYE SURGERY     left eye "hole"  . IR KYPHO THORACIC WITH BONE BIOPSY  09/26/2017  . IR RADIOLOGIST EVAL & MGMT  09/18/2017  . TONSILLECTOMY      Social History   Socioeconomic History  . Marital status: Widowed    Spouse name: Not on file  . Number of children: Not on file  . Years of education: Not on file  . Highest education level: Not on file  Occupational History  . Not on file  Social Needs  . Financial resource strain: Not on file  . Food insecurity:    Worry: Not on file    Inability: Not on file  . Transportation needs:    Medical: Not on file    Non-medical: Not on file  Tobacco Use  . Smoking status: Never Smoker  .  Smokeless tobacco: Never Used  Substance and Sexual Activity  . Alcohol use: No  . Drug use: No  . Sexual activity: Not Currently    Birth control/protection: None  Lifestyle  . Physical activity:    Days per week: Not on file    Minutes per session: Not on file  . Stress: Not on file  Relationships  . Social connections:    Talks on phone: Not on file    Gets together: Not on file    Attends religious service: Not on file    Active member of club or organization: Not on file     Attends meetings of clubs or organizations: Not on file    Relationship status: Not on file  . Intimate partner violence:    Fear of current or ex partner: Not on file    Emotionally abused: Not on file    Physically abused: Not on file    Forced sexual activity: Not on file  Other Topics Concern  . Not on file  Social History Narrative  . Not on file    Allergies  Allergen Reactions  . Penicillins Hives and Swelling    Has patient had a PCN reaction causing immediate rash, facial/tongue/throat swelling, SOB or lightheadedness with hypotension: Yes Has patient had a PCN reaction causing severe rash involving mucus membranes or skin necrosis: Yes Has patient had a PCN reaction that required hospitalization No Has patient had a PCN reaction occurring within the last 10 years: No If all of the above answers are "NO", then may proceed with Cephalosporin use.    . Sulfa Antibiotics Anaphylaxis and Swelling  . Fish Allergy Nausea And Vomiting    Family History  Problem Relation Age of Onset  . Heart disease Sister   . Heart disease Sister   . Cancer Mother   . Other Father   . Heart attack Father     Prior to Admission medications   Medication Sig Start Date End Date Taking? Authorizing Provider  acetaminophen (TYLENOL) 500 MG tablet Take 1 tablet (500 mg total) by mouth every 6 (six) hours as needed (pain). 10/08/17  Yes Rai, Ripudeep K, MD  allopurinol (ZYLOPRIM) 300 MG tablet Take 150 mg by mouth daily.    Yes [provider]  atorvastatin (LIPITOR) 10 MG tablet Take 10 mg by mouth daily.   Yes [provider]  B Complex Vitamins (VITAMIN B COMPLEX) TABS Take 1 tablet by mouth daily with lunch.   Yes [provider]  brimonidine (ALPHAGAN) 0.15 % ophthalmic solution Place 1 drop into both eyes 3 (three) times daily.   Yes [provider]  cholecalciferol (VITAMIN D) 400 units TABS tablet Take 400 Units by mouth daily.   Yes [provider]  diltiazem (CARDIZEM CD) 120 MG 24 hr capsule Take 1 capsule (120 mg total) by mouth daily. 08/26/17  Yes Corky Crafts, MD  doxycycline (VIBRA-TABS) 100 MG tablet Take 1 tablet (100 mg total) by mouth 2 (two) times daily for 7 days. 10/08/17 10/15/17 Yes Rai, Ripudeep K, MD  furosemide (LASIX) 80 MG tablet TAKE 1 TABLET BY MOUTH EVERY DAY 04/28/17  Yes Corky Crafts, MD  guaiFENesin-dextromethorphan [facility] DM) 100-10 MG/5ML syrup Take 5 mLs by mouth every 4 (four) hours as needed for cough. 10/08/17  Yes Rai, Ripudeep K, MD  isosorbide mononitrate (IMDUR) 30 MG 24 hr tablet Take 1 tablet (30 mg total) by mouth daily. 09/05/17  Yes Corky Crafts,  MD  latanoprost (XALATAN) 0.005 % ophthalmic solution Place 1 drop into both eyes at bedtime. 01/10/14  Yes [provider]  levalbuterol (XOPENEX) 0.63 MG/3ML nebulizer solution Take 3 mLs (0.63 mg total) by nebulization 3 (three) times daily for 20 days. And q4hours as needed for SOB/wheezing Patient taking differently: Take 0.63 mg by nebulization See admin instructions. Take 3 mls by Nebulization three times daily for 20 days, then change to every 4 hours as needed for shortness of breath and wheezing 10/08/17 10/28/17 Yes Rai, Ripudeep K, MD  levothyroxine (SYNTHROID, LEVOTHROID) 88 MCG tablet Take 88 mcg by mouth daily before breakfast.   Yes [provider]  metolazone (ZAROXOLYN) 2.5 MG tablet Take 1 tablet (2.5 mg total) by mouth as needed (take if weight goes up 3 lbs in 1 day or 5 lbs in 1 week). 10/27/15  Yes Weaver, Scott T, PA-C  metoprolol tartrate (LOPRESSOR) 50 MG tablet Take 1 tablet (50 mg total) by mouth 2 (two) times daily. 10/08/17  Yes Rai, Ripudeep K, MD  mometasone-formoterol (DULERA) 100-5 MCG/ACT AERO Inhale 2 puffs into the lungs 2 (two) times daily. 10/08/17  Yes Rai, Ripudeep K, MD  ondansetron (ZOFRAN) 4 MG tablet Take 4 mg by mouth every 4 (four) hours as needed for nausea or vomiting.    Yes [provider]  OVER THE COUNTER MEDICATION Take 240 mLs by mouth 2 (two) times daily. Med pass 2.0   Yes [provider]  OXYGEN Inhale 2 L into the lungs See admin instructions. To keep stats greater than 90% check sats every shift   Yes [provider]  Polyethyl Glycol-Propyl Glycol (SYSTANE OP) Apply 2 drops to eye daily as needed (dry eyes).    Yes [provider]  potassium chloride SA (K-DUR,KLOR-CON) 20 MEQ tablet Take 0.5 tablets (10 mEq total) by mouth daily. 07/14/17  Yes Elgergawy, Leana Roe, MD  predniSONE (DELTASONE) 10 MG tablet Prednisone dosing: Take  Prednisone 40mg  (4 tabs) x 2 days, then taper to 30mg  (3 tabs) x 3 days, then 20mg  (2 tabs) x 3days, then 10mg  (1 tab) x 3days, then OFF. 10/08/17  Yes Rai, Ripudeep K, MD  timolol (TIMOPTIC) 0.5 % ophthalmic solution Place 1 drop into both eyes daily.  12/28/13  Yes [provider]  traMADol (ULTRAM) 50 MG tablet Take 50 mg by mouth every 8 (eight) hours as needed for moderate pain.    Yes [provider]  spironolactone (ALDACTONE) 25 MG tablet Take 25 mg by mouth daily.    [provider]  warfarin (COUMADIN) 1 MG tablet Take 2.5 tablets daily as directed by Coumadin clinic Patient taking differently: Take 2.5 mg by mouth daily after supper. or as directed by Coumadin clinic 09/17/17   Corky Crafts, MD  warfarin (COUMADIN) 2 MG tablet Take 2 mg by mouth at bedtime.    [provider]    Physical Exam: Vitals:   10/15/17 1230 10/15/17 1300 10/15/17 1315 10/15/17 1402  BP: 129/62 (!) 159/109 (!) 113/96 (!) 154/97  Pulse: 85 90 86 100  Resp: (!) 25 (!) 32 (!) 25 19  Temp:    98.8 F (37.1 C)  TempSrc:    Oral  SpO2: 98% 98% 99% 100%  Weight:         General:  Appears calm and comfortable thin frail in no acute distress Eyes:  PERRL, EOMI, normal lids, iris ENT:  grossly normal hearing, lips & tongue, mucous membranes of her  mouth are pink but  quite dry Neck:  no LAD, masses or thyromegaly Cardiovascular:  Irregularly irregular, no m/r/g. No LE edema.  Respiratory:  No increased work of breathing but respirations are shallow breath sounds are distant fine crackles in bilateral bases. She has a wet sounding nonproductive cough due to poor cough effort Abdomen:  soft, ntnd, positive bowel sounds throughout no guarding or rebounding Skin:  no rash or induration seen on limited exam Musculoskeletal:  grossly normal tone BUE/BLE, good ROM, no bony abnormality Psychiatric:  grossly normal mood and affect, speech fluent and appropriate, AOx3 Neurologic:  CN 2-12 grossly intact, moves all extremities in coordinated fashion, sensation intact  Labs on Admission: I have personally reviewed following labs and imaging studies  CBC: Recent Labs  Lab 10/15/17 0951  WBC 23.7*  NEUTROABS 20.7*  HGB 12.5  HCT 41.2  MCV 89.4  PLT 231   Basic Metabolic Panel: Recent Labs  Lab 10/15/17 0951  NA 146*  K 3.3*  CL 101  CO2 33*  GLUCOSE 131*  BUN 48*  CREATININE 1.55*  CALCIUM 9.6   GFR: Estimated Creatinine Clearance: 18.3 mL/min (A) (by C-G formula based on SCr of 1.55 mg/dL (H)). Liver Function Tests: Recent Labs  Lab 10/15/17 0951  AST 16  ALT 31  ALKPHOS 72  BILITOT 1.6*  PROT 5.7*  ALBUMIN 3.0*   No results for input(s): LIPASE, AMYLASE in the last 168 hours. No results for input(s): AMMONIA in the last 168 hours. Coagulation Profile: Recent Labs  Lab 10/15/17 0951  INR 2.36   Cardiac Enzymes: No results for input(s): CKTOTAL, CKMB, CKMBINDEX, TROPONINI in the last 168 hours. BNP (last 3 results) No results for input(s): PROBNP in the last 8760 hours. HbA1C: No results for input(s): HGBA1C in the last 72 hours. CBG: No results for input(s): GLUCAP in the last 168 hours. Lipid Profile: No results for input(s): CHOL, HDL, LDLCALC, TRIG, CHOLHDL, LDLDIRECT in the last 72 hours. Thyroid Function Tests: No  results for input(s): TSH, T4TOTAL, FREET4, T3FREE, THYROIDAB in the last 72 hours. Anemia Panel: No results for input(s): VITAMINB12, FOLATE, FERRITIN, TIBC, IRON, RETICCTPCT in the last 72 hours. Urine analysis:    Component Value Date/Time   COLORURINE STRAW (A) 10/15/2017 1253   APPEARANCEUR CLEAR 10/15/2017 1253   LABSPEC 1.008 10/15/2017 1253   PHURINE 7.0 10/15/2017 1253   GLUCOSEU NEGATIVE 10/15/2017 1253   HGBUR NEGATIVE 10/15/2017 1253   BILIRUBINUR NEGATIVE 10/15/2017 1253   KETONESUR NEGATIVE 10/15/2017 1253   PROTEINUR NEGATIVE 10/15/2017 1253   UROBILINOGEN 1.0 01/10/2015 2237   NITRITE NEGATIVE 10/15/2017 1253   LEUKOCYTESUR NEGATIVE 10/15/2017 1253    Creatinine Clearance: Estimated Creatinine Clearance: 18.3 mL/min (A) (by C-G formula based on SCr of 1.55 mg/dL (H)).  Sepsis Labs: @LABRCNTIP (procalcitonin:4,lacticidven:4) ) Recent Results (from the past 240 hour(s))  Culture, blood (Routine x 2)     Status: None (Preliminary result)   Collection Time: 10/15/17 10:27 AM  Result Value Ref Range Status   Specimen Description BLOOD RIGHT HAND  Final   Special Requests   Final    BOTTLES DRAWN AEROBIC ONLY Blood Culture results may not be optimal due to an inadequate volume of blood received in culture bottles Performed at [facility], [address], [place], [place] [address]    Culture PENDING  Incomplete   Report Status PENDING  Incomplete     Radiological Exams on Admission: Dg Chest 2 View  Result Date: 10/15/2017 CLINICAL  DATA:  2-3 week history of productive cough. Chest pain related to coughing. Current history of hypertension and CHF. EXAM: CHEST - 2 VIEW COMPARISON:  10/04/2017, 09/29/2017 and earlier. FINDINGS: Cardiac silhouette markedly enlarged, unchanged. Thoracic aorta atherosclerotic, unchanged. Hilar and mediastinal contours otherwise unremarkable. Stable moderate-sized chronic BILATERAL pleural effusions. Worsening aeration in the lower  lobes since the examination 11 days ago. Prominent bronchovascular markings diffusely and moderate central peribronchial thickening, unchanged. Pulmonary vascularity normal. Exaggeration of the usual thoracic kyphosis. Prior augmentation of a mid thoracic vertebral body. IMPRESSION: 1. Stable moderate-sized chronic BILATERAL pleural effusions. 2. Worsening aeration in the lower lobes (LEFT greater than RIGHT). This likely reflects superimposed passive atelectasis, although pneumonia is not excluded given the productive cough. 3. Stable cardiomegaly without evidence of pulmonary edema. 4. Stable moderate changes of chronic bronchitis and/or asthma. Electronically Signed   By: Hulan Saas M.D.   On: 10/15/2017 10:53    EKG: Independently reviewed. Atrial fibrillation LVH with IVCD, LAD and secondary repol abnrm Borderline prolonged QT interval  Assessment/Plan Principal Problem:   Acute on chronic respiratory failure with hypoxia (HCC) Active Problems:   Acute on chronic diastolic heart failure (HCC)   Sepsis (HCC)   Atrial fibrillation (HCC)   Pleural effusion, bacterial   HCAP (healthcare-associated pneumonia)   Hypokalemia   Hypothyroidism   HTN (hypertension)   CKD (chronic kidney disease) stage 4, GFR 15-29 ml/min (HCC)   #1. Acute on chronic respiratory failure with hypoxia likely related to pneumonia versus atelectasis and possible mild chf exacerbatoin. Oxygen saturation level 88% on room air prior to admission.chest x-ray reveals stable chronic bilateral pleural effusions with worsening variation in the lower lobes left greater than right concerning for atelectasis versus pneumonia, no pulmonary edema. BNP greater than 1000. He has a leukocytosis cough and hypoxia vancomycin, aztreonam and azithromycin initiated in the emergency department. -Admit -Continue oxygen supplementation -Scheduled nebulizers -Antibiotics per protocol -Wean oxygen as able -Monitor  #2.health care  associated pneumonia. Patient with fever leukocytosis productive cough.chest x-ray as noted above. She has a leukocytosis but her lactic acid is within the limits of normal. -Antibiotics as noted above -Follow blood cultures -Obtain sputum cultures as able -nebulizers -antitussives -See #1  #3. Acute on chronic systolic heart failure. Patient doesn't appear overloaded on exam. Chest x-ray without pulmonary edema. BNP greater than 1000. Recent echo reveals an EF of 30-35%, diffuse hypokinesis, moderate to severe global reduction in LV systolic function severe biatrial enlargement, moderate to severe TR with severe pulmonary hypertension. Medications include Lasix, metoprolol, imdur zaroxolyn -Monitor intake and output -Continue home meds -holding zaroxolyn for now -Obtain daily weights  #4. Hypokalemia. Potassium level III.3. Likely related to home medications. -replete -Recheck -Check magnesium level  5. Sepsis. Likely related to #2. Urinalysis unremarkable.patient was tachycardia with tachypnea and hypoxia, leukocytosis. Lactic acid within the limits of normal. He was provided with IV fluids per protocol. -Antibiotics as noted above -Track lactic acid -Follow blood cultures -Obtain sputum culture as able  #6. Atrial fibrillation.persistent. [place] Bass score 5. home medications include metoprolol, Cardizem and Coumadin. INR 2.3 on admission. EKG as noted above -Coumadin per pharmacy -continue home meds  #7. Chronic kidney disease. Stage III. Baseline creatinine appears to be 1.5-1.8. At the time of admission creatinine 1.5. -Monitor  #8. Hypothyroidism -Continue home Synthroid    DVT prophylaxis: coumadin Code Status: dnr  Family Communication: none present  Disposition Plan: back to facility  Consults called: none  Admission status: inpatient  Gwenyth BenderBLACK,KAREN M MD [facility]  If 7PM-7AM, please contact night-coverage [email or web address] Password [email or web address]  10/15/2017,  2:08 PM

## 2017-10-15 NOTE — ED Notes (Signed)
Attempting to call report.  

## 2017-10-16 LAB — PROTIME-INR
INR: 2.46
PROTHROMBIN TIME: 26.5 s — AB (ref 11.4–15.2)

## 2017-10-16 LAB — BASIC METABOLIC PANEL
Anion gap: 11 (ref 5–15)
BUN: 46 mg/dL — AB (ref 8–23)
CHLORIDE: 103 mmol/L (ref 98–111)
CO2: 33 mmol/L — AB (ref 22–32)
Calcium: 9.6 mg/dL (ref 8.9–10.3)
Creatinine, Ser: 1.53 mg/dL — ABNORMAL HIGH (ref 0.44–1.00)
GFR calc Af Amer: 32 mL/min — ABNORMAL LOW (ref >60)
GFR calc non Af Amer: 28 mL/min — ABNORMAL LOW (ref >60)
GLUCOSE: 140 mg/dL — AB (ref 70–99)
POTASSIUM: 4.3 mmol/L (ref 3.5–5.1)
SODIUM: 147 mmol/L — AB (ref 135–145)

## 2017-10-16 LAB — CBC
HCT: 41.1 % (ref 36.0–46.0)
Hemoglobin: 12.2 g/dL (ref 12.0–15.0)
MCH: 27 pg (ref 26.0–34.0)
MCHC: 29.7 g/dL — ABNORMAL LOW (ref 30.0–36.0)
MCV: 90.9 fL (ref 78.0–100.0)
PLATELETS: 217 10*3/uL (ref 150–400)
RBC: 4.52 MIL/uL (ref 3.87–5.11)
RDW: 18.7 % — ABNORMAL HIGH (ref 11.5–15.5)
WBC: 15.2 10*3/uL — ABNORMAL HIGH (ref 4.0–10.5)

## 2017-10-16 MED ORDER — IPRATROPIUM-ALBUTEROL 0.5-2.5 (3) MG/3ML IN SOLN
3.0000 mL | Freq: Four times a day (QID) | RESPIRATORY_TRACT | Status: DC
  Administered 2017-10-16 – 2017-10-18 (×6): 3 mL via RESPIRATORY_TRACT
  Filled 2017-10-16 (×7): qty 3

## 2017-10-16 MED ORDER — FUROSEMIDE 40 MG PO TABS
60.0000 mg | ORAL_TABLET | Freq: Every day | ORAL | Status: DC
  Administered 2017-10-17 – 2017-10-19 (×3): 60 mg via ORAL
  Filled 2017-10-16 (×3): qty 1

## 2017-10-16 MED ORDER — WARFARIN SODIUM 2 MG PO TABS
2.0000 mg | ORAL_TABLET | Freq: Every day | ORAL | Status: DC
  Administered 2017-10-16: 2 mg via ORAL
  Filled 2017-10-16: qty 1

## 2017-10-16 MED ORDER — IPRATROPIUM BROMIDE 0.02 % IN SOLN: 0.5000 mg | Freq: Four times a day (QID) | RESPIRATORY_TRACT | Status: DC

## 2017-10-16 MED ORDER — GUAIFENESIN ER 600 MG PO TB12
600.0000 mg | ORAL_TABLET | Freq: Two times a day (BID) | ORAL | Status: DC
  Administered 2017-10-16 – 2017-10-22 (×13): 600 mg via ORAL
  Filled 2017-10-16 (×13): qty 1

## 2017-10-16 NOTE — Progress Notes (Signed)
ANTICOAGULATION CONSULT NOTE  Pharmacy Consult for warfarin Indication: atrial fibrillation  Allergies  Allergen Reactions  . Penicillins Hives and Swelling    Has patient had a PCN reaction causing immediate rash, facial/tongue/throat swelling, SOB or lightheadedness with hypotension: Yes Has patient had a PCN reaction causing severe rash involving mucus membranes or skin necrosis: Yes Has patient had a PCN reaction that required hospitalization No Has patient had a PCN reaction occurring within the last 10 years: No If all of the above answers are "NO", then may proceed with Cephalosporin use.    . Sulfa Antibiotics Anaphylaxis and Swelling  . Fish Allergy Nausea And Vomiting    Labs: Recent Labs    10/15/17 0951 10/16/17 0342  HGB 12.5 12.2  HCT 41.2 41.1  PLT 231 217  LABPROT 25.7* 26.5*  INR 2.36 2.46  CREATININE 1.55* 1.53*    Estimated Creatinine Clearance: 18.6 mL/min (A) (by C-G formula based on SCr of 1.53 mg/dL (H)).  Assessment: [age] yof presented to the ED with worsening SOB. She is on chronic warfarin for history of afib. INR is therapeutic on PTA dose of 2 mg po daily  However, she has been on doxycyline and pt received a dose of azithromycin today. These can increase the INR. CBC is WNL and no bleeding noted.   Goal of Therapy:  INR 2-3 Monitor platelets by anticoagulation protocol: Yes   Plan:  Warfarin 2mg  PO daily Daily INR  Thank you Okey RegalLisa Jacquelynn Friend, PharmD [phone] 10/16/2017,8:47 AM

## 2017-10-16 NOTE — [place] FL2 (Signed)
[place] MEDICAID FL2 LEVEL OF CARE SCREENING TOOL     IDENTIFICATION  Patient Name: Tonya Farley Birthdate: 10/21/1919 Sex: female Admission Date (Current Location): 10/15/2017  [facility] [place]   Facility and Address:  [facility], [address], [place], [place] [address]      Provider Number: [id number]  Attending Physician Name and Address:  Alba Coryegalado, Belkys A, MD  Relative Name and Phone Number:       Current Level of Care: Hospital Recommended Level of Care: Skilled Nursing Facility Prior Approval Number:    Date Approved/Denied:   PASRR Number: [id number]  Discharge Plan: SNF    Current Diagnoses: Patient Active Problem List   Diagnosis Date Noted  . Sepsis (HCC) 10/15/2017  . Hypernatremia 10/15/2017  . Cerebral embolism with cerebral infarction 10/07/2017  . Pressure injury of skin 10/05/2017  . Acute bronchitis 10/05/2017  . Viral bronchitis 10/04/2017  . Acute on chronic systolic heart failure (HCC) 07/09/2017  . Atrial fibrillation with RVR (HCC) 07/09/2017  . Hypothyroidism, adult 07/09/2017  . Pulmonary HTN (HCC) 07/09/2017  . Leukocytosis 07/09/2017  . Acute respiratory failure with hypoxia (HCC) 07/09/2017  . High blood pressure 07/09/2017  . Chronic systolic heart failure (HCC)   . Acute on chronic right heart failure (HCC) 12/22/2016  . CKD (chronic kidney disease), stage III w/ renal artery stenosis 12/22/2016  . Cellulitis of lower extremity 12/22/2016  . Chronic a-fib (HCC) 12/22/2016  . Hypothyroidism 12/22/2016  . Acute diastolic heart failure (HCC) 12/22/2016  . Elevated troponin 12/22/2016  . Acute hypokalemia 12/22/2016  . HTN (hypertension) 12/22/2016  . Dyslipidemia 12/22/2016  . Pulmonary hypertension, moderate to severe (HCC) 12/22/2016  . Abnormal urinalysis 12/22/2016  . Hypertensive heart disease 05/08/2016  . Chronic diastolic heart failure (HCC) 11/28/2015  .  Anticoagulated 11/28/2015  . CKD (chronic kidney disease) stage 3, GFR 30-59 ml/min (HCC) 09/11/2015  . CKD (chronic kidney disease) stage 4, GFR 15-29 ml/min (HCC) 09/11/2015  . Pulmonary hypertension (HCC)   . Pleural effusion on left   . Peripheral edema   . Hypokalemia   . Anasarca 08/10/2015  . Acute on chronic respiratory failure with hypoxia (HCC)   . Pleural effusion   . Physical deconditioning   . Cough   . HCAP (healthcare-associated pneumonia)   . Chest congestion   . SOB (shortness of breath)   . Swelling   . Chronic systolic CHF (congestive heart failure) (HCC), EF 35%   . HLD (hyperlipidemia)   . Influenza due to identified novel influenza A virus with other respiratory manifestations 06/07/2015  . Pleural effusion, bacterial   . Dyspnea   . AKI (acute kidney injury) (HCC)   . CAP (community acquired pneumonia) 05/30/2015  . Renal artery stenosis (HCC) 08/08/2014  . Encounter for therapeutic drug monitoring 04/21/2013  . Hypertension 06/25/2012  . Hyperlipidemia 06/25/2012  . Atrial fibrillation (HCC) 06/25/2012  . Compression fracture of lumbar spine, non-traumatic (HCC) 06/25/2012  . Chronic back pain     Orientation RESPIRATION BLADDER Height & Weight     Self, Time, Situation, Place  O2(2L [place]) Incontinent, External catheter Weight: 126 lb 12.2 oz (57.5 kg) Height:  5\' 4"  (162.6 cm)  BEHAVIORAL SYMPTOMS/MOOD NEUROLOGICAL BOWEL NUTRITION STATUS      Continent Diet(cardiac)  AMBULATORY STATUS COMMUNICATION OF NEEDS Skin   Limited Assist Verbally PU Stage and Appropriate Care(unstageable on sacrum foam dressing)  Personal Care Assistance Level of Assistance  Bathing, Dressing Bathing Assistance: Limited assistance   Dressing Assistance: Limited assistance     Functional Limitations Info             SPECIAL CARE FACTORS FREQUENCY  PT (By licensed PT), OT (By licensed OT)     PT Frequency: 5/wk OT Frequency: 5/wk             Contractures      Additional Factors Info  Code Status, Allergies Code Status Info: FULL Allergies Info: Penicillins, Sulfa Antibiotics, Fish Allergy           Current Medications (10/16/2017):  This is the current hospital active medication list Current Facility-Administered Medications  Medication Dose Route Frequency Provider Last Rate Last Dose  . acetaminophen (TYLENOL) tablet 650 mg  650 mg Oral Q6H PRN Gwenyth Bender, NP       Or  . acetaminophen (TYLENOL) suppository 650 mg  650 mg Rectal Q6H PRN Black, Lesle Chris, NP      . albuterol (PROVENTIL) (2.5 MG/3ML) 0.083% nebulizer solution 2.5 mg  2.5 mg Nebulization Q2H PRN Black, Karen M, NP      . albuterol (PROVENTIL) (2.5 MG/3ML) 0.083% nebulizer solution 2.5 mg  2.5 mg Nebulization TID Black, Karen M, NP      . allopurinol (ZYLOPRIM) tablet 150 mg  150 mg Oral Daily Gwenyth Bender, NP   150 mg at 10/16/17 0947  . atorvastatin (LIPITOR) tablet 10 mg  10 mg Oral Daily Gwenyth Bender, NP   10 mg at 10/16/17 0949  . aztreonam (AZACTAM) 0.5 g in dextrose 5 % 50 mL IVPB  0.5 g Intravenous Q8H Gwenyth Bender, NP 100 mL/hr at 10/16/17 0449 0.5 g at 10/16/17 0449  . brimonidine (ALPHAGAN) 0.15 % ophthalmic solution 1 drop  1 drop Both Eyes TID Gwenyth Bender, NP   1 drop at 10/16/17 0951  . cholecalciferol (VITAMIN D) tablet 400 Units  400 Units Oral Daily Gwenyth Bender, NP   400 Units at 10/16/17 0947  . diltiazem (CARDIZEM CD) 24 hr capsule 120 mg  120 mg Oral Daily Gwenyth Bender, NP   120 mg at 10/16/17 0948  . [START ON 10/17/2017] furosemide (LASIX) tablet 60 mg  60 mg Oral Daily Regalado, Belkys A, MD      . guaiFENesin (MUCINEX) 12 hr tablet 600 mg  600 mg Oral BID Regalado, Belkys A, MD      . HYDROcodone-acetaminophen (NORCO/VICODIN) 5-325 MG per tablet 1-2 tablet  1-2 tablet Oral Q4H PRN Black, Karen M, NP      . ipratropium (ATROVENT) nebulizer solution 0.5 mg  0.5 mg Nebulization QID Regalado, Belkys A, MD      .  isosorbide mononitrate (IMDUR) 24 hr tablet 30 mg  30 mg Oral Daily Gwenyth Bender, NP   30 mg at 10/16/17 0948  . latanoprost (XALATAN) 0.005 % ophthalmic solution 1 drop  1 drop Both Eyes QHS Gwenyth Bender, NP   1 drop at 10/15/17 2249  . levothyroxine (SYNTHROID, LEVOTHROID) tablet 88 mcg  88 mcg Oral QAC breakfast Gwenyth Bender, NP   88 mcg at 10/16/17 0950  . metoprolol tartrate (LOPRESSOR) tablet 50 mg  50 mg Oral BID Gwenyth Bender, NP   50 mg at 10/16/17 0948  . ondansetron (ZOFRAN) tablet 4 mg  4 mg Oral Q6H PRN Gwenyth Bender, NP       Or  .  ondansetron (ZOFRAN) injection 4 mg  4 mg Intravenous Q6H PRN Black, Karen M, NP      . potassium chloride (K-DUR,KLOR-CON) CR tablet 10 mEq  10 mEq Oral Daily Gwenyth Bender, NP   10 mEq at 10/16/17 0948  . senna-docusate (Senokot-S) tablet 1 tablet  1 tablet Oral QHS PRN Gwenyth Bender, NP   1 tablet at 10/15/17 1558  . timolol (TIMOPTIC) 0.5 % ophthalmic solution 1 drop  1 drop Both Eyes Daily Gwenyth Bender, NP   1 drop at 10/16/17 0951  . traMADol (ULTRAM) tablet 50 mg  50 mg Oral Q8H PRN Gwenyth Bender, NP      . Melene Muller ON 10/17/2017] vancomycin (VANCOCIN) IVPB 750 mg/150 ml premix  750 mg Intravenous Q48H Gala Murdoch, MD      . warfarin (COUMADIN) tablet 2 mg  2 mg Oral q1800 Regalado, Belkys A, MD      . Warfarin - Pharmacist Dosing Inpatient   Does not apply q1800 Rumbarger, Faye Ramsay, [facility]         Discharge Medications: Please see discharge summary for a list of discharge medications.  Relevant Imaging Results:  Relevant Lab Results:   Additional Information ss#[id number]  Burna Sis, LCSW

## 2017-10-16 NOTE — Evaluation (Signed)
Physical Therapy Evaluation Patient Details Name: Tonya MoatsMamie N Larocca MRN: [id number] DOB: 05/12/1919 Today's Date: 10/16/2017   History of Present Illness  Pt is a 84 y.o. female admitted from [facility] on 10/15/17 with worsening cough and SOB; found to have respiratory failure and sepsis, likely secondary to pneumonia. PMH includes kyphoplasty (09/26/17), CHF, CAD, chronic a-fib, CVA, CKD IV.    Clinical Impression  Pt presents with an overall decrease in functional mobility secondary to above. PTA, pt with recent admission to [facility] where she has been working with PT/OT, ambulating with RW. Today, pt able to amb short distance with Rw and min guard for balance. Limited by decreased activity tolerance, easily fatigued with minimal activity. SpO2 down to 90% on RA. Pt would benefit from continued acute PT services to maximize functional mobility and independence prior to d/c with continued SNF-level therapies.     Follow Up Recommendations SNF;Supervision for mobility/OOB    Equipment Recommendations  None recommended by PT    Recommendations for Other Services       Precautions / Restrictions Precautions Precautions: Fall;Back;Other (comment) Precaution Comments: Kyphoplasty 09/26/17; urine incontinence  Restrictions Weight Bearing Restrictions: No      Mobility  Bed Mobility Overal bed mobility: Needs Assistance Bed Mobility: Rolling;Sidelying to Sit Rolling: Supervision Sidelying to sit: Supervision       General bed mobility comments: Increased time and effort  Transfers Overall transfer level: Needs assistance Equipment used: Rolling walker (2 wheeled) Transfers: Sit to/from Stand Sit to Stand: Min guard;Min assist         General transfer comment: performed 3x sit<>stand total for changing underwear/pad and gown due to urine incontinence with standing; min guard, with 1x minA to prevent posterior LOB. Easily fatigued by this  Ambulation/Gait Ambulation/Gait  assistance: Min guard Gait Distance (Feet): 10 Feet Assistive device: Rolling walker (2 wheeled) Gait Pattern/deviations: Step-through pattern;Decreased stride length;Trunk flexed Gait velocity: Decreased Gait velocity interpretation: <1.8 ft/sec, indicate of risk for recurrent falls General Gait Details: Amb in room with close min guard for balance. Further distance limited by fatigue  Stairs            Wheelchair Mobility    Modified Rankin (Stroke Patients Only)       Balance Overall balance assessment: Needs assistance Sitting-balance support: Feet supported;No upper extremity supported Sitting balance-Leahy Scale: Fair     Standing balance support: No upper extremity supported;During functional activity Standing balance-Leahy Scale: Poor Standing balance comment: Reliant on UE support for balance                             Pertinent Vitals/Pain Pain Assessment: Faces Faces Pain Scale: Hurts a little bit Pain Location: Chest with coughing Pain Descriptors / Indicators: Sore Pain Intervention(s): Monitored during session    Home Living Family/patient expects to be discharged to:: Skilled nursing facility Living Arrangements: Alone Available Help at Discharge: Friend(s);Available PRN/intermittently Type of Home: House Home Access: Stairs to enter Entrance Stairs-Rails: Left Entrance Stairs-Number of Steps: 2 Home Layout: One level Home Equipment: Walker - 2 wheels;Walker - 4 wheels;Cane - single point;Tub bench;Toilet riser;Grab bars - toilet;Grab bars - tub/shower      Prior Function Level of Independence: Needs assistance   Gait / Transfers Assistance Needed: Pt reports ambulation with RW and supervision from staff at SNF; working on "getting stronger"  ADL's / Homemaking Assistance Needed: Assist from staff for ADLs  Comments: prior to recent admission and  d/c to SNF, pt was indep with rollator at home, drives, and has aide assist with  household tasks/ADLs as needed     Hand Dominance        Extremity/Trunk Assessment   Upper Extremity Assessment Upper Extremity Assessment: Generalized weakness    Lower Extremity Assessment Lower Extremity Assessment: Generalized weakness    Cervical / Trunk Assessment Cervical / Trunk Assessment: Kyphotic;Other exceptions Cervical / Trunk Exceptions: Recent kyphoplasty  Communication   Communication: HOH  Cognition Arousal/Alertness: Awake/alert Behavior During Therapy: WFL for tasks assessed/performed Overall Cognitive Status: Within Functional Limits for tasks assessed                                        General Comments General comments (skin integrity, edema, etc.): SpO2 down to 90% on RA     Exercises     Assessment/Plan    PT Assessment Patient needs continued PT services  PT Problem List Decreased strength;Decreased activity tolerance;Decreased balance;Decreased mobility;Decreased range of motion;Decreased knowledge of use of DME;Decreased knowledge of precautions;Cardiopulmonary status limiting activity       PT Treatment Interventions DME instruction;Gait training;Stair training;Functional mobility training;Therapeutic activities;Therapeutic exercise;Neuromuscular re-education;Balance training;Patient/family education    PT Goals (Current goals can be found in the Care Plan section)  Acute Rehab PT Goals Patient Stated Goal: Finish working with rehab at [facility] to get stronger before returning home PT Goal Formulation: With patient Time For Goal Achievement: 10/30/17 Potential to Achieve Goals: Good    Frequency Min 3X/week   Barriers to discharge Decreased caregiver support      Co-evaluation               AM-PAC PT "6 Clicks" Daily Activity  Outcome Measure Difficulty turning over in bed (including adjusting bedclothes, sheets and blankets)?: None Difficulty moving from lying on back to sitting on the side of the bed?  : A Little Difficulty sitting down on and standing up from a chair with arms (e.g., wheelchair, bedside commode, etc,.)?: Unable Help needed moving to and from a bed to chair (including a wheelchair)?: A Little Help needed walking in hospital room?: A Little Help needed climbing 3-5 steps with a railing? : A Lot 6 Click Score: 16    End of Session Equipment Utilized During Treatment: Gait belt Activity Tolerance: Patient tolerated treatment well;Patient limited by fatigue Patient left: in chair;with call bell/phone within reach Nurse Communication: Mobility status PT Visit Diagnosis: Other abnormalities of gait and mobility (R26.89);Muscle weakness (generalized) (M62.81)    Time: 1040-1110 PT Time Calculation (min) (ACUTE ONLY): 30 min   Charges:   PT Evaluation $PT Eval Moderate Complexity: 1 Mod PT Treatments $Therapeutic Activity: 8-22 mins      Ina Homes, PT, DPT Acute Rehab Services  Pager: [phone]  Malachy Chamber 10/16/2017, 11:44 AM

## 2017-10-16 NOTE — Evaluation (Signed)
Clinical/Bedside Swallow Evaluation Patient Details  Name: Tonya Farley MRN: [id number] Date of Birth: 08/18/1919  Today's Date: 10/16/2017 Time: SLP Start Time (ACUTE ONLY): 1205 SLP Stop Time (ACUTE ONLY): 1218 SLP Time Calculation (min) (ACUTE ONLY): 13 min  Past Medical History:  Past Medical History:  Diagnosis Date  . Chronic a-fib (HCC)   . Chronic back pain   . Chronic systolic (congestive) heart failure (HCC)    reduced EF since 2018 to 35-40%  . CKD (chronic kidney disease) stage 4, GFR 15-29 ml/min (HCC) 09/11/2015  . Hyperlipidemia   . Hypertension   . Hypothyroidism   . Pulmonary hypertension (HCC)    PASP 65 mmHg at Echo 3/17  . Thyroid disease    Past Surgical History:  Past Surgical History:  Procedure Laterality Date  . APPENDECTOMY    . cataracts     bilateral  . EYE SURGERY     left eye "hole"  . IR KYPHO THORACIC WITH BONE BIOPSY  09/26/2017  . IR RADIOLOGIST EVAL & MGMT  09/18/2017  . TONSILLECTOMY     HPI:  Pt is a 84 y.o. female admitted from [facility] on 10/15/17 with worsening cough and SOB; found to have respiratory failure and sepsis, likely secondary to pneumonia. CXR with worsening aeration in the lower lobes (LEFT greater than RIGHT), likely atelectasis but cannot exclude PNA. PMH includes kyphoplasty (09/26/17), CHF, CAD, chronic a-fib, CVA, CKD IV. Patient seen by SLP services for swallow evaluation in 2017 in which she demonstrated normal oropharyngeal swallowing function.    Assessment / Plan / Recommendation Clinical Impression  Patient presents with a seemingly normal oropharyngeal swallow. Oral phase timely and patient without overt s/s of aspiration post swallow. Reports no h/o difficulty swallowing. Did note frequent belching post swallow. Cannot r/o post prandial aspiration however patient without documented esophageal deficits. At this time, patient does not appear to require SLP f/u however if MD feels that further w/u is indicated in  light of recent PNA dx, can complete MBS. MD, please order if feel necessary. Otherwise, SLP signing off.  SLP Visit Diagnosis: Dysphagia, unspecified (R13.10)    Aspiration Risk  Mild aspiration risk    Diet Recommendation Regular;Thin liquid   Liquid Administration via: Cup;Straw Medication Administration: Whole meds with liquid Supervision: Patient able to self feed Compensations: Slow rate;Small sips/bites Postural Changes: Seated upright at 90 degrees;Remain upright for at least 30 minutes after po intake    Other  Recommendations Oral Care Recommendations: Oral care BID   Follow up Recommendations None        Swallow Study   General HPI: Pt is a 84 y.o. female admitted from [facility] on 10/15/17 with worsening cough and SOB; found to have respiratory failure and sepsis, likely secondary to pneumonia. CXR with worsening aeration in the lower lobes (LEFT greater than RIGHT), likely atelectasis but cannot exclude PNA. PMH includes kyphoplasty (09/26/17), CHF, CAD, chronic a-fib, CVA, CKD IV. Patient seen by SLP services for swallow evaluation in 2017 in which she demonstrated normal oropharyngeal swallowing function.  Type of Study: Bedside Swallow Evaluation Previous Swallow Assessment: see HPI Diet Prior to this Study: Regular;Thin liquids Temperature Spikes Noted: No Respiratory Status: Nasal cannula History of Recent Intubation: No Behavior/Cognition: Alert;Cooperative;Pleasant mood Oral Cavity Assessment: Within Functional Limits Oral Care Completed by SLP: No Oral Cavity - Dentition: Dentures, top;Dentures, bottom Vision: Functional for self-feeding Self-Feeding Abilities: Able to feed self Patient Positioning: Upright in bed Baseline Vocal Quality: Hoarse Volitional Cough:  Strong;Congested Volitional Swallow: Able to elicit    Oral/Motor/Sensory Function Overall Oral Motor/Sensory Function: Within functional limits   Ice Chips Ice chips: Not tested   Thin Liquid Thin  Liquid: Within functional limits Presentation: Self Fed;Straw    Nectar Thick Nectar Thick Liquid: Not tested   Honey Thick Honey Thick Liquid: Not tested   Puree Puree: Within functional limits Presentation: Self Fed;Spoon   Solid     Solid: Within functional limits Presentation: Self Fed     Tonya Hufstedler MA, CCC-SLP   Tonya Farley Tonya Farley 10/16/2017,12:27 PM

## 2017-10-16 NOTE — Progress Notes (Addendum)
PROGRESS NOTE    CLOIE Farley  RUE:[id number] DOB: December 31, 1919 DOA: 10/15/2017 PCP: Burton Apley, MD    Brief Narrative:  Tonya Farley is a very pleasant 84 y.o. female with medical history significant for chronic systolic heart failure, CAD, A. Fib, CVA, chronic kidney disease, hypothyroidism presents to the emergency Department chief complaint persistent worsening cough   Patient was recently hospitalized for rhinovirus bronchitis. During that hospitalization she was evaluated for weakness and confusion with a brain MRI that showed a subacute stroke. She passed her swallow eval and was eventually discharged on July 17 with Robitussin and Xopenex nebs Dulera and a prednisone taper. She she states that she never felt any better and continued to cough even at the time of discharge. Over the last day or so she said her cough is gotten worse. She reports a constant wet sounding nonproductive cough. Last night and this morning she felt quite short of breath. She remembers the staff at the facility "shaking me last night". No reports of any fever chills diarrhea nausea vomiting. No lower extremity edema. This morning the staff at the facility found her quite lethargic short of breath and hypoxic. Of note patient was discharged July 17 without oxygen as she "didn't need it". EMS was called  ED Course: in the emergency department she's afebrile and normotensive tachycardia with tachypnea and an oxygen saturation level of 88% on room air.she was provided with supplemental oxygen and her oxygen saturation level improved to greater than 90%. Provided with gentle IV fluids and broad-spectrum antibiotics for healthcare associated pneumonia     Assessment & Plan:   Principal Problem:   Acute on chronic respiratory failure with hypoxia (HCC) Active Problems:   Atrial fibrillation (HCC)   Pleural effusion, bacterial   HCAP (healthcare-associated pneumonia)   Chronic systolic CHF (congestive  heart failure) (HCC), EF 35%   Hypokalemia   Hypothyroidism   HTN (hypertension)   CKD (chronic kidney disease) stage III   Leukocytosis   Sepsis (HCC)   Hypernatremia  1-Acute on chronic  hypoxic respiratory failure; related to PNA, and acute on chronic systolic HF. EF 35 %  Oxygen at 88 on RA prior to admission.  Chest x ray; bilateral pleural effusion, atelectasis vs PNA>  Continue with oxygen supplement.  IV antibiotics.  Nebulizer. I will add ipratropium today.  Repeat speech evaluation.  On oral lasix.  2-PNA, Health care associated vs aspiration PNA;  Speech evaluation.  Continue with IV antibiotics.  Start Guafenesin.   3-Hypernatremia; received lasix today. Hold spironolactone.  Repeat labs in am. I have reduce lasix to 60 mg starting tomorrow, might need to hold if hypernatremia gets worse.   4-Sepsis; presents with tachycardia, leukocytosis, hypoxemia,  Related to PNA.  Continue with IV antibiotics.   5-A fib;  Continue with Cardizem, coumadin    6-CKD stage III;  Cr baseline 1.5---1.8.  Monitor on lasix.  Cr today 1.5.   7-Hypothyroidism; continue with synthroid.   8-Hypernatremia; might need to hold lasix today.  9- acute on chronic systolic HF. EF 35 %  Hypoxemia, pleural effusion, eleb=vated BNP.  On oral lasix. Might need to hold lasix if sodium increase    DVT prophylaxis: coumadin  Code Status: DNR Family Communication: care discussed with niece.  Disposition Plan:   Consultants:   none   Procedures:   none   Antimicrobials:   Aztreonam 7-24     Subjective: She is still SOB. Report pain after coughing. cough is dry  Objective: Vitals:   10/15/17 2252 10/16/17 0500 10/16/17 0633 10/16/17 0855  BP: 140/85  (!) 134/54 107/67  Pulse: 81  (!) 57 72  Resp: 20  20 20   Temp: 97.8 F (36.6 C)  97.6 F (36.4 C) (!) 97.4 F (36.3 C)  TempSrc: Oral  Oral Oral  SpO2: 100%  100% 97%  Weight:  57.5 kg (126 lb 12.2 oz)    Height:         Intake/Output Summary (Last 24 hours) at 10/16/2017 1138 Last data filed at 10/16/2017 0600 Gross per 24 hour  Intake 400 ml  Output 800 ml  Net -400 ml   Filed Weights   10/15/17 0916 10/15/17 1402 10/16/17 0500  Weight: 59 kg (130 lb) 58.8 kg (129 lb 10.1 oz) 57.5 kg (126 lb 12.2 oz)    Examination:  General exam: Appears calm and comfortable  Respiratory system: decreased breath sounds, bilateral ronchus.  Cardiovascular system: S1 & S2 heard, RRR. Trace edema LE Gastrointestinal system: Abdomen is nondistended, soft and nontender. No organomegaly or masses felt. Normal bowel sounds heard. Central nervous system: Alert and oriented. No focal neurological deficits. Extremities: Symmetric 5 x 5 power. Skin: No rashes, lesions or ulcers Psychiatry:Mood & affect appropriate.     Data Reviewed: I have personally reviewed following labs and imaging studies  CBC: Recent Labs  Lab 10/15/17 0951 10/16/17 0342  WBC 23.7* 15.2*  NEUTROABS 20.7*  --   HGB 12.5 12.2  HCT 41.2 41.1  MCV 89.4 90.9  PLT 231 217   Basic Metabolic Panel: Recent Labs  Lab 10/15/17 0951 10/15/17 1352 10/16/17 0342  NA 146*  --  147*  K 3.3*  --  4.3  CL 101  --  103  CO2 33*  --  33*  GLUCOSE 131*  --  140*  BUN 48*  --  46*  CREATININE 1.55*  --  1.53*  CALCIUM 9.6  --  9.6  MG  --  1.9  --    GFR: Estimated Creatinine Clearance: 18.6 mL/min (A) (by C-G formula based on SCr of 1.53 mg/dL (H)). Liver Function Tests: Recent Labs  Lab 10/15/17 0951  AST 16  ALT 31  ALKPHOS 72  BILITOT 1.6*  PROT 5.7*  ALBUMIN 3.0*   No results for input(s): LIPASE, AMYLASE in the last 168 hours. No results for input(s): AMMONIA in the last 168 hours. Coagulation Profile: Recent Labs  Lab 10/15/17 0951 10/16/17 0342  INR 2.36 2.46   Cardiac Enzymes: No results for input(s): CKTOTAL, CKMB, CKMBINDEX, TROPONINI in the last 168 hours. BNP (last 3 results) No results for input(s): PROBNP  in the last 8760 hours. HbA1C: No results for input(s): HGBA1C in the last 72 hours. CBG: No results for input(s): GLUCAP in the last 168 hours. Lipid Profile: No results for input(s): CHOL, HDL, LDLCALC, TRIG, CHOLHDL, LDLDIRECT in the last 72 hours. Thyroid Function Tests: No results for input(s): TSH, T4TOTAL, FREET4, T3FREE, THYROIDAB in the last 72 hours. Anemia Panel: No results for input(s): VITAMINB12, FOLATE, FERRITIN, TIBC, IRON, RETICCTPCT in the last 72 hours. Sepsis Labs: Recent Labs  Lab 10/15/17 1002 10/15/17 1316  LATICACIDVEN 1.01 1.13    Recent Results (from the past 240 hour(s))  Culture, blood (Routine x 2)     Status: None (Preliminary result)   Collection Time: 10/15/17 10:27 AM  Result Value Ref Range Status   Specimen Description BLOOD RIGHT HAND  Final   Special Requests   Final  BOTTLES DRAWN AEROBIC ONLY Blood Culture results may not be optimal due to an inadequate volume of blood received in culture bottles Performed at [facility], [address], [place], [place] [address]    Culture PENDING  Incomplete   Report Status PENDING  Incomplete         Radiology Studies: Dg Chest 2 View  Result Date: 10/15/2017 CLINICAL DATA:  2-3 week history of productive cough. Chest pain related to coughing. Current history of hypertension and CHF. EXAM: CHEST - 2 VIEW COMPARISON:  10/04/2017, 09/29/2017 and earlier. FINDINGS: Cardiac silhouette markedly enlarged, unchanged. Thoracic aorta atherosclerotic, unchanged. Hilar and mediastinal contours otherwise unremarkable. Stable moderate-sized chronic BILATERAL pleural effusions. Worsening aeration in the lower lobes since the examination 11 days ago. Prominent bronchovascular markings diffusely and moderate central peribronchial thickening, unchanged. Pulmonary vascularity normal. Exaggeration of the usual thoracic kyphosis. Prior augmentation of a mid thoracic vertebral body. IMPRESSION: 1. Stable  moderate-sized chronic BILATERAL pleural effusions. 2. Worsening aeration in the lower lobes (LEFT greater than RIGHT). This likely reflects superimposed passive atelectasis, although pneumonia is not excluded given the productive cough. 3. Stable cardiomegaly without evidence of pulmonary edema. 4. Stable moderate changes of chronic bronchitis and/or asthma. Electronically Signed   By: Hulan Saas M.D.   On: 10/15/2017 10:53        Scheduled Meds: . albuterol  2.5 mg Nebulization TID  . allopurinol  150 mg Oral Daily  . atorvastatin  10 mg Oral Daily  . brimonidine  1 drop Both Eyes TID  . cholecalciferol  400 Units Oral Daily  . diltiazem  120 mg Oral Daily  . furosemide  80 mg Oral Daily  . isosorbide mononitrate  30 mg Oral Daily  . latanoprost  1 drop Both Eyes QHS  . levothyroxine  88 mcg Oral QAC breakfast  . metoprolol tartrate  50 mg Oral BID  . potassium chloride SA  10 mEq Oral Daily  . timolol  1 drop Both Eyes Daily  . warfarin  2 mg Oral q1800  . Warfarin - Pharmacist Dosing Inpatient   Does not apply q1800   Continuous Infusions: . aztreonam 0.5 g (10/16/17 0449)  . [START ON 10/17/2017] vancomycin       LOS: 1 day    Time spent: 35 minutes.     Alba Cory, MD [facility] Pager [phone]  If 7PM-7AM, please contact night-coverage [email or web address] Password [facility] 10/16/2017, 11:38 AM

## 2017-10-17 ENCOUNTER — Inpatient Hospital Stay (HOSPITAL_COMMUNITY): Payer: Medicare Other

## 2017-10-17 LAB — CBC
HCT: 38.4 % (ref 36.0–46.0)
Hemoglobin: 11.8 g/dL — ABNORMAL LOW (ref 12.0–15.0)
MCH: 27.2 pg (ref 26.0–34.0)
MCHC: 30.7 g/dL (ref 30.0–36.0)
MCV: 88.5 fL (ref 78.0–100.0)
PLATELETS: 211 10*3/uL (ref 150–400)
RBC: 4.34 MIL/uL (ref 3.87–5.11)
RDW: 18.1 % — ABNORMAL HIGH (ref 11.5–15.5)
WBC: 20.7 10*3/uL — AB (ref 4.0–10.5)

## 2017-10-17 LAB — BASIC METABOLIC PANEL
ANION GAP: 11 (ref 5–15)
BUN: 57 mg/dL — ABNORMAL HIGH (ref 8–23)
CO2: 33 mmol/L — ABNORMAL HIGH (ref 22–32)
Calcium: 9.4 mg/dL (ref 8.9–10.3)
Chloride: 96 mmol/L — ABNORMAL LOW (ref 98–111)
Creatinine, Ser: 1.57 mg/dL — ABNORMAL HIGH (ref 0.44–1.00)
GFR calc Af Amer: 31 mL/min — ABNORMAL LOW (ref >60)
GFR, EST NON AFRICAN AMERICAN: 27 mL/min — AB (ref >60)
Glucose, Bld: 147 mg/dL — ABNORMAL HIGH (ref 70–99)
POTASSIUM: 4.4 mmol/L (ref 3.5–5.1)
SODIUM: 140 mmol/L (ref 135–145)

## 2017-10-17 LAB — PROTIME-INR
INR: 3
PROTHROMBIN TIME: 30.9 s — AB (ref 11.4–15.2)

## 2017-10-17 MED ORDER — WARFARIN SODIUM 1 MG PO TABS
1.0000 mg | ORAL_TABLET | Freq: Once | ORAL | Status: AC
  Administered 2017-10-17: 1 mg via ORAL
  Filled 2017-10-17: qty 1

## 2017-10-17 MED ORDER — SODIUM CHLORIDE 0.9 % IV SOLN
1.0000 g | INTRAVENOUS | Status: DC
  Administered 2017-10-18 – 2017-10-21 (×4): 1 g via INTRAVENOUS
  Filled 2017-10-17 (×4): qty 1

## 2017-10-17 MED ORDER — SODIUM CHLORIDE 0.9 % IV SOLN
2.0000 g | INTRAVENOUS | Status: DC
  Administered 2017-10-17: 2 g via INTRAVENOUS
  Filled 2017-10-17: qty 2

## 2017-10-17 NOTE — Progress Notes (Signed)
Modified Barium Swallow Progress Note  Patient Details  Name: Tonya Farley MRN: [id number] Date of Birth: 10/07/1919  Today's Date: 10/17/2017  Modified Barium Swallow completed.  Full report located under Chart Review in the Imaging Section.  Brief recommendations include the following:  Clinical Impression  Pt's oropharyngeal swallow is within gross functional limits for her age. She had one episode of trace penetration that reached her vocal folds, which elicited a strong, reflexive cough response to clear. No other airway compromise was noted. She had trace residue with thin liquids that cleared with additional swallows. During brief esophageal scan, pt appeared to have a column of barium remaining after only a few solid trials. With additional thin liquid boluses it appeared much clearer. MD not present to confirm findings; may wish to consider further esophageal w/u if indicated. Would continue with regular diet and thin liquids using aspiration and esophageal precautions - particularly alternating between solids/liquids. Will f/u briefly for additional education, training, and monitoring for tolerance.   Swallow Evaluation Recommendations       SLP Diet Recommendations: Regular solids;Thin liquid   Liquid Administration via: Cup;Straw   Medication Administration: Crushed with puree   Supervision: Patient able to self feed;Intermittent supervision to cue for compensatory strategies   Compensations: Slow rate;Small sips/bites;Follow solids with liquid   Postural Changes: Remain semi-upright after after feeds/meals (Comment);Seated upright at 90 degrees   Oral Care Recommendations: Oral care BID        Tonya Farley, Tonya Farley 10/17/2017,3:36 PM   Tonya Farley, M.A. CCC-SLP [phone]

## 2017-10-17 NOTE — Care Management Important Message (Signed)
Important Message  Patient Details  Name: Tonya Farley MRN: [id number] Date of Birth: 02/26/1920   Medicare Important Message Given:  Yes    Kenzel Ruesch Stefan ChurchBratton 10/17/2017, 2:48 PM

## 2017-10-17 NOTE — Care Management Note (Signed)
Case Management Note  Patient Details  Name: Driscilla MoatsMamie N Alen MRN: [id number] Date of Birth: 06/18/1919  Subjective/Objective:  From [facility], presents with medical history significantfor chronic systolic heart failure, CAD, A. Fib, CVA, chronic kidney disease, hypothyroidism presents to the emergency Department , presents with acute/chronic  Hypoxic resp failure secondary to pna, acute/chronic chf, pna,hypernatremia, sepsis,afib, ckd, hypothyroidism, hypernatremia.                    Action/Plan: NCM will follow for transition of care needs.  Expected Discharge Date:                  Expected Discharge Plan:  Skilled Nursing Facility  In-House Referral:  Clinical Social Work  Discharge planning Services  CM Consult  Post Acute Care Choice:    Choice offered to:     DME Arranged:    DME Agency:     HH Arranged:    HH Agency:     Status of Service:  In process, will continue to follow  If discussed at Long Length of Stay Meetings, dates discussed:    Additional Comments:  Leone Havenaylor, Michalene Debruler [place], RN 10/17/2017, 11:43 AM

## 2017-10-17 NOTE — Progress Notes (Signed)
PROGRESS NOTE    SCHAE CANDO  VHQ:[id number] DOB: 07-10-1919 DOA: 10/15/2017 PCP: Burton Apley, MD  Brief Narrative:84 y.o.femalewith medical history significantfor chronic systolic heart failure, CAD, A. Fib, CVA, chronic kidney disease, hypothyroidism presents to the emergency Department chief complaint persistent worsening cough   Patient was recently hospitalized for rhinovirus bronchitis. During that hospitalization she was evaluated for weakness and confusion with a brain MRI that showed a subacute stroke. She passed her swallow eval and was eventually discharged on July 17 with Robitussin and Xopenex nebs Dulera and a prednisone taper. She she states that she never felt any better and continued to cough even at the time of discharge. Over the last day or so she said her cough is gotten worse. She reports a constant wet sounding nonproductive cough. Last night and this morning she felt quite short of breath. She remembers the staff at the facility "shaking me last night". No reports of any fever chills diarrhea nausea vomiting. No lower extremity edema. This morning the staff at the facility found her quite lethargic short of breath and hypoxic. Of note patient was discharged July 17 without oxygen as she "didn't need it". EMS was called  ED Course:in the emergency department she's afebrile and normotensive tachycardia with tachypnea and an oxygen saturation level of 88% on room air.she was provided with supplemental oxygen and her oxygen saturation level improved to greater than 90%. Provided with gentle IV fluids and broad-spectrum antibiotics for healthcare associated pneumonia    Assessment & Plan:   Principal Problem:   Acute on chronic respiratory failure with hypoxia (HCC) Active Problems:   Atrial fibrillation (HCC)   Pleural effusion, bacterial   HCAP (healthcare-associated pneumonia)   Chronic systolic CHF (congestive heart failure) (HCC), EF 35%   Hypokalemia  Hypothyroidism   HTN (hypertension)   CKD (chronic kidney disease) stage 4, GFR 15-29 ml/min (HCC)   Leukocytosis   Sepsis (HCC)   Hypernatremia  1-Acute on chronic  hypoxic respiratory failure; related to PNA, and acute on chronic systolic HF. EF 35 %  Oxygen at 88 on RA prior to admission.  Chest x ray; bilateral pleural effusion, atelectasis vs PNA>stable  moderate b/l pleural effusion.  Follow-up chest x-ray 10/17/2017 Continue with oxygen supplement.  IV antibiotics.  Nebulizer Repeat speech evaluation mild aspiration risk.check mbs. On oral lasix.  2-PNA, Health care associated vs aspiration PNA;  Speech evaluation mild aspiration risk. Continue with IV antibiotics.  Start Guafenesin.   3-Hypernatremia; stable  4-Sepsis; presents with tachycardia, leukocytosis, hypoxemia,  Related to PNA.  Continue with IV antibiotics.   5-A fib;  Continue with Cardizem, coumadin    6-CKD stage III;  Cr baseline 1.5---1.8.  Monitor on lasix.  Cr today 1.57   7-Hypothyroidism; continue with synthroid.   8-Hypernatremia stable 9- acute on chronic systolic HF. EF 35 %  Hypoxemia, pleural effusion, eleb=vated BNP.  On oral lasix.      DVT prophylaxis:coumadin Code Status:dnr Family Communication:none Disposition Plantbd  Consultants:  none  Proceduresnone Antimicrobials:vanco AND AZACTAM  Subjective: Complains of ongoing cough not able to bring up phlegm dependent on oxygen at 2 L.  Objective: Vitals:   10/17/17 0022 10/17/17 0359 10/17/17 0728 10/17/17 0829  BP: (!) 133/56  (!) 153/76   Pulse: 63  86   Resp: 19  17   Temp: (!) 97.3 F (36.3 C)  97.6 F (36.4 C)   TempSrc:      SpO2: 100%  100% 98%  Weight:  59 kg (130 lb 1.1 oz)    Height:        Intake/Output Summary (Last 24 hours) at 10/17/2017 1058 Last data filed at 10/17/2017 1004 Gross per 24 hour  Intake 170 ml  Output 1250 ml  Net -1080 ml   Filed Weights   10/15/17 1402 10/16/17 0500  10/17/17 0359  Weight: 58.8 kg (129 lb 10.1 oz) 57.5 kg (126 lb 12.2 oz) 59 kg (130 lb 1.1 oz)    Examination:  General exam: Appears calm and comfortable  Respiratory system: Diminished breath sounds at the bases bilaterally.  Scattered rhonchi both lung fields.  To auscultation. Respiratory effort normal. Cardiovascular system: S1 & S2 heard, RRR. No JVD, murmurs, rubs, gallops or clicks. No pedal edema. Gastrointestinal system: Abdomen is nondistended, soft and nontender. No organomegaly or masses felt. Normal bowel sounds heard. Central nervous system: Alert and oriented. No focal neurological deficits. Extremities: Symmetric 5 x 5 power. Skin: No rashes, lesions or ulcers Psychiatry: Judgement and insight appear normal. Mood & affect appropriate.     Data Reviewed: I have personally reviewed following labs and imaging studies  CBC: Recent Labs  Lab 10/15/17 0951 10/16/17 0342 10/17/17 0315  WBC 23.7* 15.2* 20.7*  NEUTROABS 20.7*  --   --   HGB 12.5 12.2 11.8*  HCT 41.2 41.1 38.4  MCV 89.4 90.9 88.5  PLT 231 217 211   Basic Metabolic Panel: Recent Labs  Lab 10/15/17 0951 10/15/17 1352 10/16/17 0342 10/17/17 0315  NA 146*  --  147* 140  K 3.3*  --  4.3 4.4  CL 101  --  103 96*  CO2 33*  --  33* 33*  GLUCOSE 131*  --  140* 147*  BUN 48*  --  46* 57*  CREATININE 1.55*  --  1.53* 1.57*  CALCIUM 9.6  --  9.6 9.4  MG  --  1.9  --   --    GFR: Estimated Creatinine Clearance: 18.1 mL/min (A) (by C-G formula based on SCr of 1.57 mg/dL (H)). Liver Function Tests: Recent Labs  Lab 10/15/17 0951  AST 16  ALT 31  ALKPHOS 72  BILITOT 1.6*  PROT 5.7*  ALBUMIN 3.0*   No results for input(s): LIPASE, AMYLASE in the last 168 hours. No results for input(s): AMMONIA in the last 168 hours. Coagulation Profile: Recent Labs  Lab 10/15/17 0951 10/16/17 0342 10/17/17 0315  INR 2.36 2.46 3.00   Cardiac Enzymes: No results for input(s): CKTOTAL, CKMB, CKMBINDEX,  TROPONINI in the last 168 hours. BNP (last 3 results) No results for input(s): PROBNP in the last 8760 hours. HbA1C: No results for input(s): HGBA1C in the last 72 hours. CBG: No results for input(s): GLUCAP in the last 168 hours. Lipid Profile: No results for input(s): CHOL, HDL, LDLCALC, TRIG, CHOLHDL, LDLDIRECT in the last 72 hours. Thyroid Function Tests: No results for input(s): TSH, T4TOTAL, FREET4, T3FREE, THYROIDAB in the last 72 hours. Anemia Panel: No results for input(s): VITAMINB12, FOLATE, FERRITIN, TIBC, IRON, RETICCTPCT in the last 72 hours. Sepsis Labs: Recent Labs  Lab 10/15/17 1002 10/15/17 1316  LATICACIDVEN 1.01 1.13    Recent Results (from the past 240 hour(s))  Culture, blood (Routine x 2)     Status: None (Preliminary result)   Collection Time: 10/15/17 10:05 AM  Result Value Ref Range Status   Specimen Description BLOOD RIGHT FOREARM  Final   Special Requests   Final    BOTTLES DRAWN AEROBIC AND ANAEROBIC Blood  Culture results may not be optimal due to an inadequate volume of blood received in culture bottles   Culture   Final    NO GROWTH 2 DAYS Performed at [facility], [address], [place], [place] [address]    Report Status PENDING  Incomplete  Culture, blood (Routine x 2)     Status: None (Preliminary result)   Collection Time: 10/15/17 10:27 AM  Result Value Ref Range Status   Specimen Description BLOOD RIGHT HAND  Final   Special Requests   Final    BOTTLES DRAWN AEROBIC ONLY Blood Culture results may not be optimal due to an inadequate volume of blood received in culture bottles   Culture   Final    NO GROWTH 2 DAYS Performed at [facility], [address], [place], [place] [address]    Report Status PENDING  Incomplete         Radiology Studies: No results found.      Scheduled Meds: . allopurinol  150 mg Oral Daily  . atorvastatin  10 mg Oral Daily  . brimonidine  1 drop Both Eyes TID  . cholecalciferol   400 Units Oral Daily  . diltiazem  120 mg Oral Daily  . furosemide  60 mg Oral Daily  . guaiFENesin  600 mg Oral BID  . ipratropium-albuterol  3 mL Nebulization QID  . isosorbide mononitrate  30 mg Oral Daily  . latanoprost  1 drop Both Eyes QHS  . levothyroxine  88 mcg Oral QAC breakfast  . metoprolol tartrate  50 mg Oral BID  . potassium chloride SA  10 mEq Oral Daily  . timolol  1 drop Both Eyes Daily  . warfarin  1 mg Oral ONCE-1800  . Warfarin - Pharmacist Dosing Inpatient   Does not apply q1800   Continuous Infusions: . aztreonam 0.5 g (10/17/17 0358)  . vancomycin       LOS: 2 days       Alwyn Ren, MD [facility]  If 7PM-7AM, please contact night-coverage [email or web address] Password [facility] 10/17/2017, 10:58 AM

## 2017-10-17 NOTE — Clinical Social Work Note (Signed)
Clinical Social Work Assessment  Patient Details  Name: Tonya Farley MRN: [id number] Date of Birth: 09-Dec-1919  Date of referral:  10/17/17               Reason for consult:  Facility Placement                Permission sought to share information with:  Facility Sport and exercise psychologist, Family Supports Permission granted to share information::  Yes, Verbal Permission Granted  Name::     Narda Rutherford and Neurosurgeon::  SNF  Relationship::  nieces  Contact Information:     Housing/Transportation Living arrangements for the past 2 months:  [place], [place] [employer] of Information:  Patient Patient Interpreter Needed:  None Criminal Activity/Legal Involvement Pertinent to Current Situation/Hospitalization:  No - Comment as needed Significant Relationships:  Other Family Members Lives with:  Self Do you feel safe going back to the place where you live?  No Need for family participation in patient care:  Yes (Comment)(helps with decision making)  Care giving concerns:  Pt was just discharged to SNF for concerns with mobility- continues to have mobility concerns.   Social Worker assessment / plan:  CSW met with pt and confirmed she was at [facility] prior to admission.  Discussed plan for time of DC.  Employment status:  Retired Forensic scientist:  Medicare PT Recommendations:  [place] / Referral to community resources:  [place]  Patient/Family's Response to care:  Pt agreeable to return to SNF when stable.  Patient/Family's Understanding of and Emotional Response to Diagnosis, Current Treatment, and Prognosis:  No questions or concerns- getting somewhat hopeless with being so sick for so long.  Emotional Assessment Appearance:  Appears stated age Attitude/Demeanor/Rapport:    Affect (typically observed):  Appropriate, Pleasant Orientation:  Oriented to Self, Oriented to Place, Oriented to  Time, Oriented to  Situation Alcohol / Substance use:  Not Applicable Psych involvement (Current and /or in the community):  No (Comment)  Discharge Needs  Concerns to be addressed:  Care Coordination Readmission within the last 30 days:  Yes Current discharge risk:  Physical Impairment Barriers to Discharge:  Continued Medical Work up   Jorge Ny, LCSW 10/17/2017, 2:18 PM

## 2017-10-17 NOTE — Progress Notes (Signed)
Physical Therapy Treatment Patient Details Name: Tonya MoatsMamie N Farley MRN: [id number] DOB: 06/10/1919 Today's Date: 10/17/2017    History of Present Illness Pt is a 84 y.o. female admitted from [facility] on 10/15/17 with worsening cough and SOB; found to have respiratory failure and sepsis, likely secondary to pneumonia. PMH includes kyphoplasty (09/26/17), CHF, CAD, chronic a-fib, CVA, CKD IV.   PT Comments    Pt slowly progressing with mobility. Today's session focused on BLE strengthening with short amb distance and repeated sit<>stands using RW and min guard for balance. Pt with decreased activity tolerance, easily fatigued by minimal activity. Continues to have chest soreness with non-productive cough. SpO2 down to 86% on RA, returning to >91% on [place]. Continue to recommend SNF-level therapies to maximize functional mobility and independence.    Follow Up Recommendations  SNF;Supervision for mobility/OOB     Equipment Recommendations  None recommended by PT    Recommendations for Other Services       Precautions / Restrictions Precautions Precautions: Fall;Back;Other (comment) Precaution Comments: Kyphoplasty 09/26/17; urine incontinence  Restrictions Weight Bearing Restrictions: No    Mobility  Bed Mobility Overal bed mobility: Needs Assistance Bed Mobility: Rolling;Sidelying to Sit Rolling: Supervision Sidelying to sit: Supervision       General bed mobility comments: Increased time and effort  Transfers Overall transfer level: Needs assistance Equipment used: Rolling walker (2 wheeled) Transfers: Sit to/from Stand Sit to Stand: Min guard         General transfer comment: Performed 2x sit<>stand with RW for underwear/pad placement due to urine incontinence. Then performed repeated 10x sit<>stand from recliner; increased time and effort, but no physical assist required.  Ambulation/Gait Ambulation/Gait assistance: Min guard Gait Distance (Feet): 10 Feet Assistive  device: Rolling walker (2 wheeled) Gait Pattern/deviations: Step-through pattern;Decreased stride length;Trunk flexed Gait velocity: Decreased Gait velocity interpretation: <1.8 ft/sec, indicate of risk for recurrent falls General Gait Details: Amb in room with close min guard for balance    Stairs             Wheelchair Mobility    Modified Rankin (Stroke Patients Only)       Balance Overall balance assessment: Needs assistance Sitting-balance support: Feet supported;No upper extremity supported Sitting balance-Leahy Scale: Fair     Standing balance support: No upper extremity supported;During functional activity Standing balance-Leahy Scale: Poor Standing balance comment: Reliant on UE support for balance                            Cognition Arousal/Alertness: Awake/alert Behavior During Therapy: WFL for tasks assessed/performed Overall Cognitive Status: Within Functional Limits for tasks assessed                                        Exercises      General Comments General comments (skin integrity, edema, etc.): SpO2 down to 86% on RA; >91% on 2L O2 [place]. Pt endorses chest soreness with coughing but reports hugging pillow during coughs does not help      Pertinent Vitals/Pain Pain Assessment: Faces Faces Pain Scale: Hurts a little bit Pain Location: Chest with coughing Pain Descriptors / Indicators: Sore;Grimacing Pain Intervention(s): Monitored during session    Home Living                      Prior Function  PT Goals (current goals can now be found in the care plan section) Acute Rehab PT Goals Patient Stated Goal: Finish working with rehab at [facility] to get stronger before returning home; and "figure out what is wrong with me" PT Goal Formulation: With patient Time For Goal Achievement: 10/30/17 Potential to Achieve Goals: Good Progress towards PT goals: Progressing toward goals    Frequency     Min 3X/week      PT Plan Current plan remains appropriate    Co-evaluation              AM-PAC PT "6 Clicks" Daily Activity  Outcome Measure  Difficulty turning over in bed (including adjusting bedclothes, sheets and blankets)?: None Difficulty moving from lying on back to sitting on the side of the bed? : A Little Difficulty sitting down on and standing up from a chair with arms (e.g., wheelchair, bedside commode, etc,.)?: A Little Help needed moving to and from a bed to chair (including a wheelchair)?: A Little Help needed walking in hospital room?: A Little Help needed climbing 3-5 steps with a railing? : A Lot 6 Click Score: 18    End of Session Equipment Utilized During Treatment: Gait belt;Oxygen Activity Tolerance: Patient tolerated treatment well Patient left: in chair;with call bell/phone within reach Nurse Communication: Mobility status PT Visit Diagnosis: Other abnormalities of gait and mobility (R26.89);Muscle weakness (generalized) (M62.81)     Time: 1610-9604 PT Time Calculation (min) (ACUTE ONLY): 19 min  Charges:  $Therapeutic Exercise: 8-22 mins                     Ina Homes, PT, DPT Acute Rehab Services  Pager: [phone]  Malachy Chamber 10/17/2017, 12:16 PM

## 2017-10-17 NOTE — Progress Notes (Signed)
Pharmacy Antibiotic Note  Tonya Farley is a 84 y.o. female admitted on 10/15/2017 with pneumonia and sepsis.  Pharmacy has been consulted for vancomycin dosing.  Received one time dose of aztreonam and vancomycin in ED. WBC 20.7, LA 1.13, afebrile. Scr 1.57 (CrCl ~18 mL/min).   Dr. Jerolyn CenterMathews is ok with changing aztreonam to cefepime to cover her PNA. Cross reactivity is very low and coverage is much better.   Plan: Vancomycin 750 mg  IV every 48 hours.  Goal trough 15-20 mcg/mL.  DC aztreonam Start cefepime 2g IV q24 Monitor renal fx, clinical pic, cx results, VT as indicated  Height: 5\' 4"  (162.6 cm) Weight: 130 lb 1.1 oz (59 kg) IBW/kg (Calculated) : 54.7  Temp (24hrs), Avg:97.8 F (36.6 C), Min:97.3 F (36.3 C), Max:98.5 F (36.9 C)  Recent Labs  Lab 10/15/17 0951 10/15/17 1002 10/15/17 1316 10/16/17 0342 10/17/17 0315  WBC 23.7*  --   --  15.2* 20.7*  CREATININE 1.55*  --   --  1.53* 1.57*  LATICACIDVEN  --  1.01 1.13  --   --     Estimated Creatinine Clearance: 18.1 mL/min (A) (by C-G formula based on SCr of 1.57 mg/dL (H)).    Allergies  Allergen Reactions  . Penicillins Hives and Swelling    Has patient had a PCN reaction causing immediate rash, facial/tongue/throat swelling, SOB or lightheadedness with hypotension: Yes Has patient had a PCN reaction causing severe rash involving mucus membranes or skin necrosis: Yes Has patient had a PCN reaction that required hospitalization No Has patient had a PCN reaction occurring within the last 10 years: No If all of the above answers are "NO", then may proceed with Cephalosporin use.    . Sulfa Antibiotics Anaphylaxis and Swelling  . Fish Allergy Nausea And Vomiting    Antimicrobials this admission: Azithromycin 7/24 x1 Aztreonam 7/24 >>7/26 Vancomycin 7/24 >>  Cefepime 7/26>> Dose adjustments this admission: N/A  Microbiology results: 724 BCx: sent  7/24 Sputum: sent   Tonya Farley, PharmD, BCIDP, AAHIVP,  CPP Infectious Disease Pharmacist Pager: [phone] 10/17/2017 1:43 PM

## 2017-10-17 NOTE — Progress Notes (Signed)
ANTICOAGULATION CONSULT NOTE  Pharmacy Consult for warfarin Indication: atrial fibrillation  Allergies  Allergen Reactions  . Penicillins Hives and Swelling    Has patient had a PCN reaction causing immediate rash, facial/tongue/throat swelling, SOB or lightheadedness with hypotension: Yes Has patient had a PCN reaction causing severe rash involving mucus membranes or skin necrosis: Yes Has patient had a PCN reaction that required hospitalization No Has patient had a PCN reaction occurring within the last 10 years: No If all of the above answers are "NO", then may proceed with Cephalosporin use.    . Sulfa Antibiotics Anaphylaxis and Swelling  . Fish Allergy Nausea And Vomiting    Labs: Recent Labs    10/15/17 0951 10/16/17 0342 10/17/17 0315  HGB 12.5 12.2 11.8*  HCT 41.2 41.1 38.4  PLT 231 217 211  LABPROT 25.7* 26.5* 30.9*  INR 2.36 2.46 3.00  CREATININE 1.55* 1.53* 1.57*    Estimated Creatinine Clearance: 18.1 mL/min (A) (by C-G formula based on SCr of 1.57 mg/dL (H)).  Assessment: 89 yof presented to the ED with worsening SOB. She is on chronic warfarin for history of afib. INR is therapeutic on PTA dose of 2 mg po daily  However, she has been on doxycyline and pt received a dose of azithromycin today. These can increase the INR. CBC is WNL and no bleeding noted.   INR up to 3 today so we will back coumadin down for today then resume home dose again.   Goal of Therapy:  INR 2-3 Monitor platelets by anticoagulation protocol: Yes   Plan:  Warfarin 1mg  x 1 Daily INR  Ulyses SouthwardMinh Pham, PharmD, [place]IDP, AAHIVP, CPP Infectious Disease Pharmacist Pager: [phone] 10/17/2017 10:32 AM

## 2017-10-18 LAB — CBC
HCT: 38.1 % (ref 36.0–46.0)
Hemoglobin: 11.9 g/dL — ABNORMAL LOW (ref 12.0–15.0)
MCH: 27.2 pg (ref 26.0–34.0)
MCHC: 31.2 g/dL (ref 30.0–36.0)
MCV: 87.2 fL (ref 78.0–100.0)
PLATELETS: 207 10*3/uL (ref 150–400)
RBC: 4.37 MIL/uL (ref 3.87–5.11)
RDW: 17.9 % — AB (ref 11.5–15.5)
WBC: 18.2 10*3/uL — AB (ref 4.0–10.5)

## 2017-10-18 LAB — BASIC METABOLIC PANEL
ANION GAP: 10 (ref 5–15)
BUN: 52 mg/dL — ABNORMAL HIGH (ref 8–23)
CO2: 33 mmol/L — ABNORMAL HIGH (ref 22–32)
Calcium: 9.4 mg/dL (ref 8.9–10.3)
Chloride: 98 mmol/L (ref 98–111)
Creatinine, Ser: 1.33 mg/dL — ABNORMAL HIGH (ref 0.44–1.00)
GFR calc non Af Amer: 33 mL/min — ABNORMAL LOW (ref >60)
GFR, EST AFRICAN AMERICAN: 38 mL/min — AB (ref >60)
Glucose, Bld: 101 mg/dL — ABNORMAL HIGH (ref 70–99)
Potassium: 3.8 mmol/L (ref 3.5–5.1)
SODIUM: 141 mmol/L (ref 135–145)

## 2017-10-18 LAB — PROTIME-INR
INR: 3.8
PROTHROMBIN TIME: 37.2 s — AB (ref 11.4–15.2)

## 2017-10-18 MED ORDER — IPRATROPIUM-ALBUTEROL 0.5-2.5 (3) MG/3ML IN SOLN
3.0000 mL | Freq: Three times a day (TID) | RESPIRATORY_TRACT | Status: DC
Start: 2017-10-18 — End: 2017-10-21
  Administered 2017-10-18 – 2017-10-21 (×8): 3 mL via RESPIRATORY_TRACT
  Filled 2017-10-18 (×11): qty 3

## 2017-10-18 MED ORDER — BISACODYL 5 MG PO TBEC: 10.0000 mg | DELAYED_RELEASE_TABLET | Freq: Every day | ORAL | Status: DC | PRN

## 2017-10-18 MED ORDER — BISACODYL 10 MG RE SUPP
10.0000 mg | Freq: Once | RECTAL | Status: AC
  Administered 2017-10-18: 10 mg via RECTAL
  Filled 2017-10-18: qty 1

## 2017-10-18 MED ORDER — GUAIFENESIN-CODEINE 100-10 MG/5ML PO SOLN
5.0000 mL | Freq: Four times a day (QID) | ORAL | Status: DC | PRN
  Administered 2017-10-18 – 2017-10-21 (×5): 5 mL via ORAL
  Filled 2017-10-18 (×5): qty 5

## 2017-10-18 MED ORDER — LACTULOSE 10 GM/15ML PO SOLN
10.0000 g | Freq: Once | ORAL | Status: AC
  Administered 2017-10-18: 10 g via ORAL
  Filled 2017-10-18: qty 15

## 2017-10-18 NOTE — Progress Notes (Signed)
ANTICOAGULATION CONSULT NOTE  Pharmacy Consult for warfarin Indication: atrial fibrillation  Allergies  Allergen Reactions  . Penicillins Hives and Swelling    Has patient had a PCN reaction causing immediate rash, facial/tongue/throat swelling, SOB or lightheadedness with hypotension: Yes Has patient had a PCN reaction causing severe rash involving mucus membranes or skin necrosis: Yes Has patient had a PCN reaction that required hospitalization No Has patient had a PCN reaction occurring within the last 10 years: No If all of the above answers are "NO", then may proceed with Cephalosporin use.    . Sulfa Antibiotics Anaphylaxis and Swelling  . Fish Allergy Nausea And Vomiting    Labs: Recent Labs    10/16/17 0342 10/17/17 0315 10/18/17 0223  HGB 12.2 11.8* 11.9*  HCT 41.1 38.4 38.1  PLT 217 211 207  LABPROT 26.5* 30.9* 37.2*  INR 2.46 3.00 3.80  CREATININE 1.53* 1.57* 1.33*    Estimated Creatinine Clearance: 21.4 mL/min (A) (by C-G formula based on SCr of 1.33 mg/dL (H)).  Assessment: [age] yof presented to the ED with worsening SOB. She is on chronic warfarin for history of afib.   However, she has been on doxycyline and pt received a dose of azithromycin on 7/24. These can increase the INR.    INR up to 3.8 today. CBC is WNL and no bleeding noted.  Goal of Therapy:  INR 2-3 Monitor platelets by anticoagulation protocol: Yes   Plan:  Hold warfarin today. Monitor CBC, s/sx of bleeding Daily INR  Danae OrleansMegan Adrean Findlay, PharmD PGY1 Pharmacy Resident Phone [phone] 10/18/2017       9:00 AM

## 2017-10-18 NOTE — Progress Notes (Signed)
PROGRESS NOTE    Tonya MoatsMamie N Farley  ZOX:[id number] DOB: 12/04/1919 DOA: 10/15/2017 PCP: Burton Apleyoberts, Ronald, MD  Brief Narrative:84 y.o.femalewith medical history significantfor chronic systolic heart failure, CAD, A. Fib, CVA, chronic kidney disease, hypothyroidism presents to the emergency Department chief complaint persistent worsening cough   Patient was recently hospitalized for rhinovirus bronchitis. During that hospitalization she was evaluated for weakness and confusion with a brain MRI that showed a subacute stroke. She passed her swallow eval and was eventually discharged on July 17 with Robitussin and Xopenex nebs Dulera and a prednisone taper. She she states that she never felt any better and continued to cough even at the time of discharge. Over the last day or so she said her cough is gotten worse. She reports a constant wet sounding nonproductive cough. Last night and this morning she felt quite short of breath. She remembers the staff at the facility "shaking me last night". No reports of any fever chills diarrhea nausea vomiting. No lower extremity edema. This morning the staff at the facility found her quite lethargic short of breath and hypoxic. Of note patient was discharged July 17 without oxygen as she "didn't need it". EMS was called  ED Course:in the emergency department she's afebrile and normotensive tachycardia with tachypnea and an oxygen saturation level of 88% on room air.she was provided with supplemental oxygen and her oxygen saturation level improved to greater than 90%. Provided with gentle IV fluids and broad-spectrum antibiotics for healthcare associated pneumonia     Assessment & Plan:   Principal Problem:   Acute on chronic respiratory failure with hypoxia (HCC) Active Problems:   Atrial fibrillation (HCC)   Pleural effusion, bacterial   HCAP (healthcare-associated pneumonia)   Chronic systolic CHF (congestive heart failure) (HCC), EF 35%    Hypokalemia   Hypothyroidism   HTN (hypertension)   CKD (chronic kidney disease) stage 4, GFR 15-29 ml/min (HCC)   Leukocytosis   Sepsis (HCC)   Hypernatremia  1-Acute on chronic hypoxic respiratory failure; related to PNA, and acute on chronic systolic HF. EF 35 %  Oxygen at 88 on RA prior to admission.  Chest x ray; bilateral pleural effusion, atelectasis vs PNA>stable moderate b/l pleural effusion.  Follow-up chest x-ray 10/17/2017 improvement in pleural effusion now she has mild pleural effusion bilaterally. Continue with oxygen supplement.  IV antibiotics.  Nebulizer Repeat speech evaluation mild aspiration risk.check mbs. On oral lasix.  2-PNA, Health care associated vs aspiration PNA; leukocytosis improving. Speech evaluation mild aspiration risk. Continue with IV antibiotics.  Start Guafenesin.  4-Sepsis; presents with tachycardia, leukocytosis, hypoxemia,  Related to PNA.  Continue with IV antibiotics.   5-A fib;  Continue with Cardizem, coumadin    6-CKD stage III;  Cr baseline 1.5---1.8.  Monitor on lasix.  Cr today 1.33  7-Hypothyroidism;continue with synthroid.   9- acute on chronic systolic HF. EF 35 %  Hypoxemia, pleural effusion, eleb=vated BNP.  On oral lasix. She is negative by 1.6 L and daily weights.  Follow-up chest x-ray tomorrow.        DVT prophylaxis Coumadin Code Status: DNR Family Communication: Discussed with her niece who is her POA Disposition Plan: TBD  Consultants: Phone consultation with [facility]  Procedures: None Antimicrobials:VAN Comycin and cefepime  Subjective: Resting in bed flat denies any worsening dyspnea continues to have cough sounds wet but feels better than yesterday.   Objective: Vitals:   10/17/17 1651 10/18/17 0005 10/18/17 0820 10/18/17 0901  BP: (!) 137/57 132/62 (!) 158/84  Pulse: 71 65 76 79  Resp: 16   18  Temp: 97.8 F (36.6 C) 97.8 F (36.6 C) 97.6 F (36.4 C)   TempSrc:  Oral     SpO2: 98% 99% 98% 98%  Weight:      Height:        Intake/Output Summary (Last 24 hours) at 10/18/2017 1033 Last data filed at 10/18/2017 0300 Gross per 24 hour  Intake 945.3 ml  Output 1150 ml  Net -204.7 ml   Filed Weights   10/15/17 1402 10/16/17 0500 10/17/17 0359  Weight: 58.8 kg (129 lb 10.1 oz) 57.5 kg (126 lb 12.2 oz) 59 kg (130 lb 1.1 oz)    Examination:  General exam: Appears calm and comfortable  Respiratory system: Decreased breath sounds at the bases with scattered rhonchi to auscultation. Respiratory effort normal. Cardiovascular system: S1 & S2 heard, RRR. No JVD, murmurs, rubs, gallops or clicks. No pedal edema. Gastrointestinal system: Abdomen is nondistended, soft and nontender. No organomegaly or masses felt. Normal bowel sounds heard. Central nervous system: Alert and oriented. No focal neurological deficits. Extremities: Symmetric 5 x 5 power. Skin: No rashes, lesions or ulcers Psychiatry: Judgement and insight appear normal. Mood & affect appropriate.     Data Reviewed: I have personally reviewed following labs and imaging studies  CBC: Recent Labs  Lab 10/15/17 0951 10/16/17 0342 10/17/17 0315 10/18/17 0223  WBC 23.7* 15.2* 20.7* 18.2*  NEUTROABS 20.7*  --   --   --   HGB 12.5 12.2 11.8* 11.9*  HCT 41.2 41.1 38.4 38.1  MCV 89.4 90.9 88.5 87.2  PLT 231 217 211 207   Basic Metabolic Panel: Recent Labs  Lab 10/15/17 0951 10/15/17 1352 10/16/17 0342 10/17/17 0315 10/18/17 0223  NA 146*  --  147* 140 141  K 3.3*  --  4.3 4.4 3.8  CL 101  --  103 96* 98  CO2 33*  --  33* 33* 33*  GLUCOSE 131*  --  140* 147* 101*  BUN 48*  --  46* 57* 52*  CREATININE 1.55*  --  1.53* 1.57* 1.33*  CALCIUM 9.6  --  9.6 9.4 9.4  MG  --  1.9  --   --   --    GFR: Estimated Creatinine Clearance: 21.4 mL/min (A) (by C-G formula based on SCr of 1.33 mg/dL (H)). Liver Function Tests: Recent Labs  Lab 10/15/17 0951  AST 16  ALT 31  ALKPHOS 72  BILITOT  1.6*  PROT 5.7*  ALBUMIN 3.0*   No results for input(s): LIPASE, AMYLASE in the last 168 hours. No results for input(s): AMMONIA in the last 168 hours. Coagulation Profile: Recent Labs  Lab 10/15/17 0951 10/16/17 0342 10/17/17 0315 10/18/17 0223  INR 2.36 2.46 3.00 3.80   Cardiac Enzymes: No results for input(s): CKTOTAL, CKMB, CKMBINDEX, TROPONINI in the last 168 hours. BNP (last 3 results) No results for input(s): PROBNP in the last 8760 hours. HbA1C: No results for input(s): HGBA1C in the last 72 hours. CBG: No results for input(s): GLUCAP in the last 168 hours. Lipid Profile: No results for input(s): CHOL, HDL, LDLCALC, TRIG, CHOLHDL, LDLDIRECT in the last 72 hours. Thyroid Function Tests: No results for input(s): TSH, T4TOTAL, FREET4, T3FREE, THYROIDAB in the last 72 hours. Anemia Panel: No results for input(s): VITAMINB12, FOLATE, FERRITIN, TIBC, IRON, RETICCTPCT in the last 72 hours. Sepsis Labs: Recent Labs  Lab 10/15/17 1002 10/15/17 1316  LATICACIDVEN 1.01 1.13    Recent Results (  from the past 240 hour(s))  Culture, blood (Routine x 2)     Status: None (Preliminary result)   Collection Time: 10/15/17 10:05 AM  Result Value Ref Range Status   Specimen Description BLOOD RIGHT FOREARM  Final   Special Requests   Final    BOTTLES DRAWN AEROBIC AND ANAEROBIC Blood Culture results may not be optimal due to an inadequate volume of blood received in culture bottles   Culture   Final    NO GROWTH 2 DAYS Performed at [facility], [address] [place], [place] [address]    Report Status PENDING  Incomplete  Culture, blood (Routine x 2)     Status: None (Preliminary result)   Collection Time: 10/15/17 10:27 AM  Result Value Ref Range Status   Specimen Description BLOOD RIGHT HAND  Final   Special Requests   Final    BOTTLES DRAWN AEROBIC ONLY Blood Culture results may not be optimal due to an inadequate volume of blood received in culture bottles    Culture   Final    NO GROWTH 2 DAYS Performed at [facility], [address] [place], [place] [address]    Report Status PENDING  Incomplete         Radiology Studies: Dg Chest 1 View  Result Date: 10/17/2017 CLINICAL DATA:  84 year old female with cough for several days. EXAM: CHEST  1 VIEW COMPARISON:  Chest radiograph 10/15/2017 and earlier. FINDINGS: Portable AP semi upright view at 1209 hours. Persistent small bilateral pleural effusions. Stable pulmonary vascularity, no acute edema. No pneumothorax. Lung base atelectasis, but no consolidation identified. Stable cardiomegaly and mediastinal contours. Calcified aortic atherosclerosis. Chronic lower thoracic augmented compression fracture. Negative visible bowel gas pattern. IMPRESSION: 1. Small pleural effusions with atelectasis are stable since 10/15/2017. 2. No new cardiopulmonary abnormality. 3. Chronic cardiomegaly, Aortic Atherosclerosis (ICD10-I70.0). Electronically Signed   By: Odessa Fleming M.D.   On: 10/17/2017 12:34   Dg Swallowing Func-speech Pathology  Result Date: 10/17/2017 Objective Swallowing Evaluation: Type of Study: MBS-Modified Barium Swallow Study  Patient Details Name: Tonya Farley MRN: [id number] Date of Birth: Jul 31, 1919 Today's Date: 10/17/2017 Time: SLP Start Time (ACUTE ONLY): 1447 -SLP Stop Time (ACUTE ONLY): 1505 SLP Time Calculation (min) (ACUTE ONLY): 18 min Past Medical History: Past Medical History: Diagnosis Date . Chronic a-fib (HCC)  . Chronic back pain  . Chronic systolic (congestive) heart failure (HCC)   reduced EF since 2018 to 35-40% . CKD (chronic kidney disease) stage 4, GFR 15-29 ml/min (HCC) 09/11/2015 . Hyperlipidemia  . Hypertension  . Hypothyroidism  . Pulmonary hypertension (HCC)   PASP 65 mmHg at Echo 3/17 . Thyroid disease  Past Surgical History: Past Surgical History: Procedure Laterality Date . APPENDECTOMY   . cataracts    bilateral . EYE SURGERY    left eye "hole" . IR KYPHO THORACIC  WITH BONE BIOPSY  09/26/2017 . IR RADIOLOGIST EVAL & MGMT  09/18/2017 . TONSILLECTOMY   HPI: Pt is a 84 y.o. female admitted from [facility] on 10/15/17 with worsening cough and SOB; found to have respiratory failure and sepsis, likely secondary to pneumonia. CXR with worsening aeration in the lower lobes (LEFT greater than RIGHT), likely atelectasis but cannot exclude PNA. PMH includes kyphoplasty (09/26/17), CHF, CAD, chronic a-fib, CVA, CKD IV. Patient seen by SLP services for swallow evaluation in 2017 in which she demonstrated normal oropharyngeal swallowing function. BSE this admission 7/25 showed what appeared to be functional oropharyngeal swallow  although with delayed coughing that could be suggestive of esophageal component. MD ordered MBS to better assess.  Subjective: pt alert, pleasant, says she swallows well but sometimes has some trouble with her dentures Assessment / Plan / Recommendation CHL IP CLINICAL IMPRESSIONS 10/17/2017 Clinical Impression Pt's oropharyngeal swallow is within gross functional limits for her age. She had one episode of trace penetration that reached her vocal folds, which elicited a strong, reflexive cough response to clear. No other airway compromise was noted. She had trace residue with thin liquids that cleared with additional swallows. During brief esophageal scan, pt appeared to have a column of barium remaining after only a few solid trials. With additional thin liquid boluses it appeared much clearer. MD not present to confirm findings; may wish to consider further esophageal w/u if indicated. Would continue with regular diet and thin liquids using aspiration and esophageal precautions - particularly alternating between solids/liquids. Will f/u briefly for additional education, training, and monitoring for tolerance. SLP Visit Diagnosis Dysphagia, unspecified (R13.10) Attention and concentration deficit following -- Frontal lobe and executive function deficit following --  Impact on safety and function Mild aspiration risk   CHL IP TREATMENT RECOMMENDATION 10/17/2017 Treatment Recommendations Therapy as outlined in treatment plan below   No flowsheet data found. CHL IP DIET RECOMMENDATION 10/17/2017 SLP Diet Recommendations Regular solids;Thin liquid Liquid Administration via Cup;Straw Medication Administration Crushed with puree Compensations Slow rate;Small sips/bites;Follow solids with liquid Postural Changes Remain semi-upright after after feeds/meals (Comment);Seated upright at 90 degrees   CHL IP OTHER RECOMMENDATIONS 10/17/2017 Recommended Consults -- Oral Care Recommendations Oral care BID Other Recommendations --   CHL IP FOLLOW UP RECOMMENDATIONS 10/17/2017 Follow up Recommendations None   CHL IP FREQUENCY AND DURATION 10/17/2017 Speech Therapy Frequency (ACUTE ONLY) min 1 x/week Treatment Duration 1 week      CHL IP ORAL PHASE 10/17/2017 Oral Phase WFL Oral - Pudding Teaspoon -- Oral - Pudding Cup -- Oral - Honey Teaspoon -- Oral - Honey Cup -- Oral - Nectar Teaspoon -- Oral - Nectar Cup -- Oral - Nectar Straw -- Oral - Thin Teaspoon -- Oral - Thin Cup -- Oral - Thin Straw -- Oral - Puree -- Oral - Mech Soft -- Oral - Regular -- Oral - Multi-Consistency -- Oral - Pill -- Oral Phase - Comment --  CHL IP PHARYNGEAL PHASE 10/17/2017 Pharyngeal Phase Impaired Pharyngeal- Pudding Teaspoon -- Pharyngeal -- Pharyngeal- Pudding Cup -- Pharyngeal -- Pharyngeal- Honey Teaspoon -- Pharyngeal -- Pharyngeal- Honey Cup -- Pharyngeal -- Pharyngeal- Nectar Teaspoon -- Pharyngeal -- Pharyngeal- Nectar Cup -- Pharyngeal -- Pharyngeal- Nectar Straw -- Pharyngeal -- Pharyngeal- Thin Teaspoon -- Pharyngeal -- Pharyngeal- Thin Cup -- Pharyngeal -- Pharyngeal- Thin Straw Penetration/Aspiration before swallow Pharyngeal Material enters airway, CONTACTS cords and then ejected out Pharyngeal- Puree -- Pharyngeal -- Pharyngeal- Mechanical Soft -- Pharyngeal -- Pharyngeal- Regular -- Pharyngeal --  Pharyngeal- Multi-consistency -- Pharyngeal -- Pharyngeal- Pill -- Pharyngeal -- Pharyngeal Comment --  CHL IP CERVICAL ESOPHAGEAL PHASE 10/17/2017 Cervical Esophageal Phase WFL Pudding Teaspoon -- Pudding Cup -- Honey Teaspoon -- Honey Cup -- Nectar Teaspoon -- Nectar Cup -- Nectar Straw -- Thin Teaspoon -- Thin Cup -- Thin Straw -- Puree -- Mechanical Soft -- Regular -- Multi-consistency -- Pill -- Cervical Esophageal Comment -- Maxcine Ham 10/17/2017, 3:38 PM  Maxcine Ham, M.A. CCC-SLP [phone]                  Scheduled Meds: . allopurinol  150 mg Oral Daily  .  atorvastatin  10 mg Oral Daily  . brimonidine  1 drop Both Eyes TID  . cholecalciferol  400 Units Oral Daily  . diltiazem  120 mg Oral Daily  . furosemide  60 mg Oral Daily  . guaiFENesin  600 mg Oral BID  . ipratropium-albuterol  3 mL Nebulization QID  . isosorbide mononitrate  30 mg Oral Daily  . latanoprost  1 drop Both Eyes QHS  . levothyroxine  88 mcg Oral QAC breakfast  . metoprolol tartrate  50 mg Oral BID  . potassium chloride SA  10 mEq Oral Daily  . timolol  1 drop Both Eyes Daily  . Warfarin - Pharmacist Dosing Inpatient   Does not apply q1800   Continuous Infusions: . ceFEPime (MAXIPIME) IV    . vancomycin 750 mg (10/17/17 1257)     LOS: 3 days     Alwyn Ren, MD [facility]  If 7PM-7AM, please contact night-coverage [email or web address] Password [email or web address] 10/18/2017, 10:33 AM

## 2017-10-19 ENCOUNTER — Inpatient Hospital Stay (HOSPITAL_COMMUNITY): Payer: Medicare Other

## 2017-10-19 LAB — BASIC METABOLIC PANEL
Anion gap: 11 (ref 5–15)
BUN: 47 mg/dL — ABNORMAL HIGH (ref 8–23)
CALCIUM: 9.4 mg/dL (ref 8.9–10.3)
CO2: 29 mmol/L (ref 22–32)
Chloride: 99 mmol/L (ref 98–111)
Creatinine, Ser: 1.61 mg/dL — ABNORMAL HIGH (ref 0.44–1.00)
GFR calc non Af Amer: 26 mL/min — ABNORMAL LOW (ref >60)
GFR, EST AFRICAN AMERICAN: 30 mL/min — AB (ref >60)
Glucose, Bld: 145 mg/dL — ABNORMAL HIGH (ref 70–99)
Potassium: 4.5 mmol/L (ref 3.5–5.1)
Sodium: 139 mmol/L (ref 135–145)

## 2017-10-19 LAB — CBC
HCT: 42.4 % (ref 36.0–46.0)
Hemoglobin: 13 g/dL (ref 12.0–15.0)
MCH: 27.1 pg (ref 26.0–34.0)
MCHC: 30.7 g/dL (ref 30.0–36.0)
MCV: 88.5 fL (ref 78.0–100.0)
Platelets: 165 10*3/uL (ref 150–400)
RBC: 4.79 MIL/uL (ref 3.87–5.11)
RDW: 17.9 % — ABNORMAL HIGH (ref 11.5–15.5)
WBC: 20.1 10*3/uL — ABNORMAL HIGH (ref 4.0–10.5)

## 2017-10-19 LAB — PROTIME-INR
INR: 2.59
PROTHROMBIN TIME: 27.5 s — AB (ref 11.4–15.2)

## 2017-10-19 LAB — BRAIN NATRIURETIC PEPTIDE: B Natriuretic Peptide: 859.8 pg/mL — ABNORMAL HIGH (ref 0.0–100.0)

## 2017-10-19 MED ORDER — METOPROLOL TARTRATE 12.5 MG HALF TABLET: 12.5000 mg | ORAL_TABLET | Freq: Every day | ORAL | Status: DC

## 2017-10-19 MED ORDER — METOPROLOL SUCCINATE ER 25 MG PO TB24
12.5000 mg | ORAL_TABLET | Freq: Every day | ORAL | Status: DC
  Administered 2017-10-20 – 2017-10-22 (×3): 12.5 mg via ORAL
  Filled 2017-10-19 (×3): qty 1

## 2017-10-19 MED ORDER — WARFARIN SODIUM 2 MG PO TABS
2.0000 mg | ORAL_TABLET | Freq: Once | ORAL | Status: AC
  Administered 2017-10-19: 2 mg via ORAL
  Filled 2017-10-19: qty 1

## 2017-10-19 MED ORDER — FUROSEMIDE 10 MG/ML IJ SOLN
40.0000 mg | Freq: Two times a day (BID) | INTRAMUSCULAR | Status: DC
  Administered 2017-10-19 – 2017-10-21 (×4): 40 mg via INTRAVENOUS
  Filled 2017-10-19 (×4): qty 4

## 2017-10-19 MED ORDER — ALLOPURINOL 100 MG PO TABS
100.0000 mg | ORAL_TABLET | Freq: Every day | ORAL | Status: DC
  Administered 2017-10-20 – 2017-10-22 (×3): 100 mg via ORAL
  Filled 2017-10-19 (×3): qty 1

## 2017-10-19 NOTE — Plan of Care (Signed)
Pt with good appetite, consumed 100% of dinner.

## 2017-10-19 NOTE — Progress Notes (Signed)
ANTICOAGULATION CONSULT NOTE  Pharmacy Consult for warfarin Indication: atrial fibrillation  Allergies  Allergen Reactions  . Penicillins Hives and Swelling    Has patient had a PCN reaction causing immediate rash, facial/tongue/throat swelling, SOB or lightheadedness with hypotension: Yes Has patient had a PCN reaction causing severe rash involving mucus membranes or skin necrosis: Yes Has patient had a PCN reaction that required hospitalization No Has patient had a PCN reaction occurring within the last 10 years: No If all of the above answers are "NO", then may proceed with Cephalosporin use.    . Sulfa Antibiotics Anaphylaxis and Swelling  . Fish Allergy Nausea And Vomiting    Labs: Recent Labs    10/17/17 0315 10/18/17 0223 10/19/17 0314  HGB 11.8* 11.9* 13.0  HCT 38.4 38.1 42.4  PLT 211 207 165  LABPROT 30.9* 37.2* 27.5*  INR 3.00 3.80 2.59  CREATININE 1.57* 1.33* 1.61*    Estimated Creatinine Clearance: 17.6 mL/min (A) (by C-G formula based on SCr of 1.61 mg/dL (H)).  Assessment: Tonya Farley yof presented to the ED with worsening SOB. She is on chronic warfarin for history of afib.   However, she has been on doxycyline and pt received a dose of azithromycin on 7/24. These can increase the INR.    INR therapeutic at 2.59 CBC is WNL  Nurse noted episode of emesis overnight.   Goal of Therapy:  INR 2-3 Monitor platelets by anticoagulation protocol: Yes   Plan:  Resume home dose of warfarin 2 mg x 1 today Monitor CBC, s/sx of bleeding, episodes of emesis Daily INR  Danae OrleansMegan McCarthy, PharmD PGY1 Pharmacy Resident Phone [phone] 10/19/2017       8:11 AM

## 2017-10-19 NOTE — Progress Notes (Signed)
Pt requested assistance. When RN went to check on pt, pt had vomit on the side rail and on her gown. Pt also did not know where she was, but still oriented to person and time. RN administered PRN zofran as ordered and provided oral mouth care. Will continue to monitor pt.

## 2017-10-19 NOTE — Progress Notes (Signed)
  I was present for limited US of the chest for possible thoracentesis.  There is only a small amount of fluid.  There is not enough fluid for benefit to outweigh risk for thoracentesis at this time.  Diuresis has likely improved the amount of effusion present.  Sabena Winner S Chayla Shands PA-C 10/19/2017 11:31 AM

## 2017-10-19 NOTE — Progress Notes (Signed)
PROGRESS NOTE    Tonya Farley  BJY:[id number] DOB: 08/04/1919 DOA: 10/15/2017 PCP: Burton Apley, MD   Brief Narrative:84 y.o.femalewith medical history significantfor chronic systolic heart failure, CAD, A. Fib, CVA, chronic kidney disease, hypothyroidism presents to the emergency Department chief complaint persistent worsening cough   Patient was recently hospitalized for rhinovirus bronchitis. During that hospitalization she was evaluated for weakness and confusion with a brain MRI that showed a subacute stroke. She passed her swallow eval and was eventually discharged on July 17 with Robitussin and Xopenex nebs Dulera and a prednisone taper. She she states that she never felt any better and continued to cough even at the time of discharge. Over the last day or so she said her cough is gotten worse. She reports a constant wet sounding nonproductive cough. Last night and this morning she felt quite short of breath. She remembers the staff at the facility "shaking me last night". No reports of any fever chills diarrhea nausea vomiting. No lower extremity edema. This morning the staff at the facility found her quite lethargic short of breath and hypoxic. Of note patient was discharged July 17 without oxygen as she "didn't need it". EMS was called  ED Course:in the emergency department she's afebrile and normotensive tachycardia with tachypnea and an oxygen saturation level of 88% on room air.she was provided with supplemental oxygen and her oxygen saturation level improved to greater than 90%. Provided with gentle IV fluids and broad-spectrum antibiotics for healthcare associated pneumonia      Assessment & Plan:   Principal Problem:   Acute on chronic respiratory failure with hypoxia (HCC) Active Problems:   Atrial fibrillation (HCC)   Pleural effusion, bacterial   HCAP (healthcare-associated pneumonia)   Chronic systolic CHF (congestive heart failure) (HCC), EF 35%  Hypokalemia   Hypothyroidism   HTN (hypertension)   CKD (chronic kidney disease) stage 4, GFR 15-29 ml/min (HCC)   Leukocytosis   Sepsis (HCC)   Hypernatremia  1] acute on chronic hypoxic respiratory failure secondary to healthcare associated pneumonia recent viral bronchitis and acute on chronic systolic heart failure with ejection fraction 35%-patient continued to be oxygen dependent at 2 L.  At the time of admission she was 88% on room air.  Chest x-ray has been followed almost daily.Today's chest x-ray shows bilateral effusion but very small amount of fluid.  IR consult was placed per family request though I have told him many times there is not enough fluid to do a thoracentesis.  Family reports that the last time she had this she had fluid removed and she was fine.  I appreciate IR input.  Spoke to Dr. Fredia Sorrow today.  And ultrasound-guided thoracentesis was not done due to very small amount of fluid present which could improve with IV diuresis.  The benefits of doing a thoracentesis outweigh the risk.  This was explained to both the nieces multiple times in the last 2 days.  Patient is currently on Vanco and cefepime.  With the rising creatinine we will stop the vancomycin.  The recent MRSA PCR in April 2019 was negative.  I do not have one from this admission.  Continue cefepime for another 2 more days.  Follow-up chest x-ray tomorrow.  Patient seen by speech evaluation with very mild aspiration risk.  Patient continues to have leukocytosis though she is hemodynamically stable with normal vital signs and she is afebrile.  2] acute on chronic systolic CHF with low EF 35% patient remains negative over 2 .5 L.  However her BNP is elevated though this is partly due to  CKD.  I have  started her on Lasix IV 40 twice daily keeping in mind her creatinine will increase And DC'd the 60 mg p.o. Lasix.  At the time of admission her height weight was 126 pounds which went up to 130 during this hospital stay  and then now back down to 127 today.  Follow daily weights I's and O's.  She is also on Cardizem 120 mg daily, Lopressor 50 mg twice a day, Imdur 30 mg daily.  3] hypertension her blood pressure remains normal to soft on Cardizem Lopressor Imdur and Lasix.  Her heart rate this morning is 54 high-stepping 89.  I will decrease the dose of Lopressor especially with increasing dose of Lasix today.  4] chronic atrial fibrillation on Cardizem and Coumadin and Lopressor.  She is rate controlled and INR therapeutic.  5] CKD stage III baseline creatinine 1.5-1.8.  Creatinine today is 1.61 monitor closely on increased dose of Lasix.  6] hypothyroidism continue Synthroid recent TSH normal.      DVT prophylaxis: Coumadin Code Status: DO NOT RESUSCITATE Family Communication: Discussed with family every day she has 2 nieces who are her POA who and who takes care of her. Disposition Plan: TBD   Consultants:  discussed with cardiology on the phone.  Procedures: None Antimicrobials: Vanco and Maxipime.  Vanco will be stopped today.  Subjective: Patient resting in bed, ongoing complaints of cough.  Had one episode of vomiting last night.  And had multiple bowel movements after giving lactulose and Dulcolax per family request for constipation just for 1 day .  Send the barium that was given for the barium swallow is going to stuff her belly and cause obstruction.   Objective: Vitals:   10/18/17 2249 10/18/17 2328 10/19/17 0500 10/19/17 0843  BP: (!) 130/58 (!) 132/54  (!) 120/56  Pulse: 89 72  (!) 54  Resp:  16    Temp:  (!) 97.5 F (36.4 C)  97.9 F (36.6 C)  TempSrc:  Oral  Oral  SpO2:  98%    Weight:   57.7 kg (127 lb 3.3 oz)   Height:        Intake/Output Summary (Last 24 hours) at 10/19/2017 1312 Last data filed at 10/19/2017 1100 Gross per 24 hour  Intake 200 ml  Output 500 ml  Net -300 ml   Filed Weights   10/16/17 0500 10/17/17 0359 10/19/17 0500  Weight: 57.5 kg (126 lb 12.2  oz) 59 kg (130 lb 1.1 oz) 57.7 kg (127 lb 3.3 oz)    Examination:  General exam: Appears calm and comfortable  Respiratory system: Decreased breath sounds at the bases with scattered rhonchi in both lung fields auscultation. Respiratory effort normal. Cardiovascular system: S1 & S2 heard, RRR. No JVD, murmurs, rubs, gallops or clicks. No pedal edema. Gastrointestinal system: Abdomen is nondistended, soft and nontender. No organomegaly or masses felt. Normal bowel sounds heard. Central nervous system: Alert and oriented. No focal neurological deficits. Extremities: No edema Skin: No rashes, lesions or ulcers Psychiatry: Judgement and insight appear normal. Mood & affect appropriate.     Data Reviewed: I have personally reviewed following labs and imaging studies  CBC: Recent Labs  Lab 10/15/17 0951 10/16/17 0342 10/17/17 0315 10/18/17 0223 10/19/17 0314  WBC 23.7* 15.2* 20.7* 18.2* 20.1*  NEUTROABS 20.7*  --   --   --   --   HGB 12.5 12.2 11.8* 11.9*  13.0  HCT 41.2 41.1 38.4 38.1 42.4  MCV 89.4 90.9 88.5 87.2 88.5  PLT 231 217 211 207 165   Basic Metabolic Panel: Recent Labs  Lab 10/15/17 0951 10/15/17 1352 10/16/17 0342 10/17/17 0315 10/18/17 0223 10/19/17 0314  NA 146*  --  147* 140 141 139  K 3.3*  --  4.3 4.4 3.8 4.5  CL 101  --  103 96* 98 99  CO2 33*  --  33* 33* 33* 29  GLUCOSE 131*  --  140* 147* 101* 145*  BUN 48*  --  46* 57* 52* 47*  CREATININE 1.55*  --  1.53* 1.57* 1.33* 1.61*  CALCIUM 9.6  --  9.6 9.4 9.4 9.4  MG  --  1.9  --   --   --   --    GFR: Estimated Creatinine Clearance: 17.6 mL/min (A) (by C-G formula based on SCr of 1.61 mg/dL (H)). Liver Function Tests: Recent Labs  Lab 10/15/17 0951  AST 16  ALT 31  ALKPHOS 72  BILITOT 1.6*  PROT 5.7*  ALBUMIN 3.0*   No results for input(s): LIPASE, AMYLASE in the last 168 hours. No results for input(s): AMMONIA in the last 168 hours. Coagulation Profile: Recent Labs  Lab 10/15/17 0951  10/16/17 0342 10/17/17 0315 10/18/17 0223 10/19/17 0314  INR 2.36 2.46 3.00 3.80 2.59   Cardiac Enzymes: No results for input(s): CKTOTAL, CKMB, CKMBINDEX, TROPONINI in the last 168 hours. BNP (last 3 results) No results for input(s): PROBNP in the last 8760 hours. HbA1C: No results for input(s): HGBA1C in the last 72 hours. CBG: No results for input(s): GLUCAP in the last 168 hours. Lipid Profile: No results for input(s): CHOL, HDL, LDLCALC, TRIG, CHOLHDL, LDLDIRECT in the last 72 hours. Thyroid Function Tests: No results for input(s): TSH, T4TOTAL, FREET4, T3FREE, THYROIDAB in the last 72 hours. Anemia Panel: No results for input(s): VITAMINB12, FOLATE, FERRITIN, TIBC, IRON, RETICCTPCT in the last 72 hours. Sepsis Labs: Recent Labs  Lab 10/15/17 1002 10/15/17 1316  LATICACIDVEN 1.01 1.13    Recent Results (from the past 240 hour(s))  Culture, blood (Routine x 2)     Status: None (Preliminary result)   Collection Time: 10/15/17 10:05 AM  Result Value Ref Range Status   Specimen Description BLOOD RIGHT FOREARM  Final   Special Requests   Final    BOTTLES DRAWN AEROBIC AND ANAEROBIC Blood Culture results may not be optimal due to an inadequate volume of blood received in culture bottles   Culture   Final    NO GROWTH 3 DAYS Performed at [facility], [address] [place], [place] [address]    Report Status PENDING  Incomplete  Culture, blood (Routine x 2)     Status: None (Preliminary result)   Collection Time: 10/15/17 10:27 AM  Result Value Ref Range Status   Specimen Description BLOOD RIGHT HAND  Final   Special Requests   Final    BOTTLES DRAWN AEROBIC ONLY Blood Culture results may not be optimal due to an inadequate volume of blood received in culture bottles   Culture   Final    NO GROWTH 3 DAYS Performed at [facility], [address], [place], [place] [address]    Report Status PENDING  Incomplete         Radiology Studies: Dg Chest 2  View  Result Date: 10/19/2017 CLINICAL DATA:  Hypoxia. EXAM: CHEST - 2 VIEW COMPARISON:  One-view chest x-ray  10/17/2017 FINDINGS: Heart is enlarged. Interstitial edema has increased slightly. Bilateral pleural effusions are present. Bibasilar airspace disease likely reflects atelectasis. Spinal augmentation is noted in the midthoracic spine. Focal kyphosis is present at the same level. IMPRESSION: 1. Cardiomegaly with mild edema and bilateral effusions. 2. Bibasilar airspace disease likely reflects atelectasis. Infection is considered less likely. Electronically Signed   By: Marin Roberts M.D.   On: 10/19/2017 08:05   Dg Swallowing Func-speech Pathology  Result Date: 10/17/2017 Objective Swallowing Evaluation: Type of Study: MBS-Modified Barium Swallow Study  Patient Details Name: TZIPPORAH NAGORSKI MRN: [id number] Date of Birth: Jun 24, 1919 Today's Date: 10/17/2017 Time: SLP Start Time (ACUTE ONLY): 1447 -SLP Stop Time (ACUTE ONLY): 1505 SLP Time Calculation (min) (ACUTE ONLY): 18 min Past Medical History: Past Medical History: Diagnosis Date . Chronic a-fib (HCC)  . Chronic back pain  . Chronic systolic (congestive) heart failure (HCC)   reduced EF since 2018 to 35-40% . CKD (chronic kidney disease) stage 4, GFR 15-29 ml/min (HCC) 09/11/2015 . Hyperlipidemia  . Hypertension  . Hypothyroidism  . Pulmonary hypertension (HCC)   PASP 65 mmHg at Echo 3/17 . Thyroid disease  Past Surgical History: Past Surgical History: Procedure Laterality Date . APPENDECTOMY   . cataracts    bilateral . EYE SURGERY    left eye "hole" . IR KYPHO THORACIC WITH BONE BIOPSY  09/26/2017 . IR RADIOLOGIST EVAL & MGMT  09/18/2017 . TONSILLECTOMY   HPI: Pt is a 84 y.o. female admitted from [facility] on 10/15/17 with worsening cough and SOB; found to have respiratory failure and sepsis, likely secondary to pneumonia. CXR with worsening aeration in the lower lobes (LEFT greater than RIGHT), likely atelectasis but cannot exclude PNA. PMH  includes kyphoplasty (09/26/17), CHF, CAD, chronic a-fib, CVA, CKD IV. Patient seen by SLP services for swallow evaluation in 2017 in which she demonstrated normal oropharyngeal swallowing function. BSE this admission 7/25 showed what appeared to be functional oropharyngeal swallow although with delayed coughing that could be suggestive of esophageal component. MD ordered MBS to better assess.  Subjective: pt alert, pleasant, says she swallows well but sometimes has some trouble with her dentures Assessment / Plan / Recommendation CHL IP CLINICAL IMPRESSIONS 10/17/2017 Clinical Impression Pt's oropharyngeal swallow is within gross functional limits for her age. She had one episode of trace penetration that reached her vocal folds, which elicited a strong, reflexive cough response to clear. No other airway compromise was noted. She had trace residue with thin liquids that cleared with additional swallows. During brief esophageal scan, pt appeared to have a column of barium remaining after only a few solid trials. With additional thin liquid boluses it appeared much clearer. MD not present to confirm findings; may wish to consider further esophageal w/u if indicated. Would continue with regular diet and thin liquids using aspiration and esophageal precautions - particularly alternating between solids/liquids. Will f/u briefly for additional education, training, and monitoring for tolerance. SLP Visit Diagnosis Dysphagia, unspecified (R13.10) Attention and concentration deficit following -- Frontal lobe and executive function deficit following -- Impact on safety and function Mild aspiration risk   CHL IP TREATMENT RECOMMENDATION 10/17/2017 Treatment Recommendations Therapy as outlined in treatment plan below   No flowsheet data found. CHL IP DIET RECOMMENDATION 10/17/2017 SLP Diet Recommendations Regular solids;Thin liquid Liquid Administration via Cup;Straw Medication Administration Crushed with puree Compensations Slow  rate;Small sips/bites;Follow solids with liquid Postural Changes Remain semi-upright after after feeds/meals (Comment);Seated upright at 90 degrees   CHL IP OTHER  RECOMMENDATIONS 10/17/2017 Recommended Consults -- Oral Care Recommendations Oral care BID Other Recommendations --   CHL IP FOLLOW UP RECOMMENDATIONS 10/17/2017 Follow up Recommendations None   CHL IP FREQUENCY AND DURATION 10/17/2017 Speech Therapy Frequency (ACUTE ONLY) min 1 x/week Treatment Duration 1 week      CHL IP ORAL PHASE 10/17/2017 Oral Phase WFL Oral - Pudding Teaspoon -- Oral - Pudding Cup -- Oral - Honey Teaspoon -- Oral - Honey Cup -- Oral - Nectar Teaspoon -- Oral - Nectar Cup -- Oral - Nectar Straw -- Oral - Thin Teaspoon -- Oral - Thin Cup -- Oral - Thin Straw -- Oral - Puree -- Oral - Mech Soft -- Oral - Regular -- Oral - Multi-Consistency -- Oral - Pill -- Oral Phase - Comment --  CHL IP PHARYNGEAL PHASE 10/17/2017 Pharyngeal Phase Impaired Pharyngeal- Pudding Teaspoon -- Pharyngeal -- Pharyngeal- Pudding Cup -- Pharyngeal -- Pharyngeal- Honey Teaspoon -- Pharyngeal -- Pharyngeal- Honey Cup -- Pharyngeal -- Pharyngeal- Nectar Teaspoon -- Pharyngeal -- Pharyngeal- Nectar Cup -- Pharyngeal -- Pharyngeal- Nectar Straw -- Pharyngeal -- Pharyngeal- Thin Teaspoon -- Pharyngeal -- Pharyngeal- Thin Cup -- Pharyngeal -- Pharyngeal- Thin Straw Penetration/Aspiration before swallow Pharyngeal Material enters airway, CONTACTS cords and then ejected out Pharyngeal- Puree -- Pharyngeal -- Pharyngeal- Mechanical Soft -- Pharyngeal -- Pharyngeal- Regular -- Pharyngeal -- Pharyngeal- Multi-consistency -- Pharyngeal -- Pharyngeal- Pill -- Pharyngeal -- Pharyngeal Comment --  CHL IP CERVICAL ESOPHAGEAL PHASE 10/17/2017 Cervical Esophageal Phase WFL Pudding Teaspoon -- Pudding Cup -- Honey Teaspoon -- Honey Cup -- Nectar Teaspoon -- Nectar Cup -- Nectar Straw -- Thin Teaspoon -- Thin Cup -- Thin Straw -- Puree -- Mechanical Soft -- Regular -- Multi-consistency  -- Pill -- Cervical Esophageal Comment -- Maxcine Ham 10/17/2017, 3:38 PM  Maxcine Ham, M.A. CCC-SLP [phone]                  Scheduled Meds: . allopurinol  150 mg Oral Daily  . atorvastatin  10 mg Oral Daily  . brimonidine  1 drop Both Eyes TID  . cholecalciferol  400 Units Oral Daily  . diltiazem  120 mg Oral Daily  . furosemide  40 mg Intravenous BID  . guaiFENesin  600 mg Oral BID  . ipratropium-albuterol  3 mL Nebulization TID  . isosorbide mononitrate  30 mg Oral Daily  . latanoprost  1 drop Both Eyes QHS  . levothyroxine  88 mcg Oral QAC breakfast  . metoprolol tartrate  50 mg Oral BID  . potassium chloride SA  10 mEq Oral Daily  . timolol  1 drop Both Eyes Daily  . warfarin  2 mg Oral ONCE-1800  . Warfarin - Pharmacist Dosing Inpatient   Does not apply q1800   Continuous Infusions: . ceFEPime (MAXIPIME) IV 1 g (10/19/17 1005)  . vancomycin 750 mg (10/17/17 1257)     LOS: 4 days     Alwyn Ren, MD [facility]  If 7PM-7AM, please contact night-coverage [email or web address] Password [email or web address] 10/19/2017, 1:12 PM

## 2017-10-20 ENCOUNTER — Inpatient Hospital Stay (HOSPITAL_COMMUNITY): Payer: Medicare Other

## 2017-10-20 DIAGNOSIS — J9621 Acute and chronic respiratory failure with hypoxia: Secondary | ICD-10-CM

## 2017-10-20 DIAGNOSIS — I5022 Chronic systolic (congestive) heart failure: Secondary | ICD-10-CM

## 2017-10-20 DIAGNOSIS — I482 Chronic atrial fibrillation: Secondary | ICD-10-CM

## 2017-10-20 DIAGNOSIS — I509 Heart failure, unspecified: Secondary | ICD-10-CM

## 2017-10-20 LAB — BASIC METABOLIC PANEL
ANION GAP: 10 (ref 5–15)
BUN: 38 mg/dL — ABNORMAL HIGH (ref 8–23)
CALCIUM: 9.1 mg/dL (ref 8.9–10.3)
CO2: 31 mmol/L (ref 22–32)
Chloride: 99 mmol/L (ref 98–111)
Creatinine, Ser: 1.37 mg/dL — ABNORMAL HIGH (ref 0.44–1.00)
GFR calc Af Amer: 36 mL/min — ABNORMAL LOW (ref >60)
GFR, EST NON AFRICAN AMERICAN: 31 mL/min — AB (ref >60)
GLUCOSE: 109 mg/dL — AB (ref 70–99)
Potassium: 3.5 mmol/L (ref 3.5–5.1)
Sodium: 140 mmol/L (ref 135–145)

## 2017-10-20 LAB — CBC
HCT: 37.6 % (ref 36.0–46.0)
Hemoglobin: 11.5 g/dL — ABNORMAL LOW (ref 12.0–15.0)
MCH: 27.1 pg (ref 26.0–34.0)
MCHC: 30.6 g/dL (ref 30.0–36.0)
MCV: 88.5 fL (ref 78.0–100.0)
PLATELETS: 178 10*3/uL (ref 150–400)
RBC: 4.25 MIL/uL (ref 3.87–5.11)
RDW: 17.8 % — AB (ref 11.5–15.5)
WBC: 17.1 10*3/uL — AB (ref 4.0–10.5)

## 2017-10-20 LAB — CULTURE, BLOOD (ROUTINE X 2)
CULTURE: NO GROWTH
CULTURE: NO GROWTH

## 2017-10-20 LAB — PROTIME-INR
INR: 2.13
Prothrombin Time: 23.6 seconds — ABNORMAL HIGH (ref 11.4–15.2)

## 2017-10-20 MED ORDER — POTASSIUM CHLORIDE 10 MEQ/100ML IV SOLN
10.0000 meq | INTRAVENOUS | Status: AC
  Filled 2017-10-20: qty 100

## 2017-10-20 MED ORDER — WARFARIN SODIUM 2 MG PO TABS
2.0000 mg | ORAL_TABLET | Freq: Every day | ORAL | Status: DC
  Administered 2017-10-20 – 2017-10-21 (×2): 2 mg via ORAL
  Filled 2017-10-20 (×2): qty 1

## 2017-10-20 MED ORDER — POTASSIUM CHLORIDE 10 MEQ/100ML IV SOLN
10.0000 meq | Freq: Once | INTRAVENOUS | Status: AC
  Administered 2017-10-20: 10 meq via INTRAVENOUS
  Filled 2017-10-20: qty 100

## 2017-10-20 NOTE — Progress Notes (Signed)
PROGRESS NOTE    Tonya Farley  ZOX:[id number] DOB: Feb 19, 1920 DOA: 10/15/2017 PCP: Burton Apley, MD   Brief Narrative: 84 y.o.femalewith medical history significantfor chronic systolic heart failure, CAD, A. Fib, CVA, chronic kidney disease, hypothyroidism presents to the emergency Department chief complaint persistent worsening cough   Patient was recently hospitalized for rhinovirus bronchitis. During that hospitalization she was evaluated for weakness and confusion with a brain MRI that showed a subacute stroke. She passed her swallow eval and was eventually discharged on July 17 with Robitussin and Xopenex nebs Dulera and a prednisone taper. She she states that she never felt any better and continued to cough even at the time of discharge. Over the last day or so she said her cough is gotten worse. She reports a constant wet sounding nonproductive cough. Last night and this morning she felt quite short of breath. She remembers the staff at the facility "shaking me last night". No reports of any fever chills diarrhea nausea vomiting. No lower extremity edema. This morning the staff at the facility found her quite lethargic short of breath and hypoxic. Of note patient was discharged July 17 without oxygen as she "didn't need it". EMS was called  ED Course:in the emergency department she's afebrile and normotensive tachycardia with tachypnea and an oxygen saturation level of 88% on room air.she was provided with supplemental oxygen and her oxygen saturation level improved to greater than 90%. Provided with gentle IV fluids and broad-spectrum antibiotics for healthcare associated pneumonia     Assessment & Plan:   Principal Problem:   Acute on chronic respiratory failure with hypoxia (HCC) Active Problems:   Atrial fibrillation (HCC)   Pleural effusion, bacterial   HCAP (healthcare-associated pneumonia)   Chronic systolic CHF (congestive heart failure) (HCC), EF 35%  Hypokalemia   Hypothyroidism   HTN (hypertension)   CKD (chronic kidney disease) stage 4, GFR 15-29 ml/min (HCC)   Leukocytosis   Sepsis (HCC)   Hypernatremia  1] acute on chronic hypoxic respiratory failure secondary to healthcare associated pneumonia recent viral bronchitis and acute on chronic systolic heart failure with ejection fraction 35%-patient continued to be oxygen dependent at 2 L.  At the time of admission she was 88% on room air.  Chest x-ray has been followed almost daily.Today's chest x-ray shows bilateral effusion but very small amount of fluid.  IR consult was placed per family request though I have told them many times there is not enough fluid to do a thoracentesis.  Family reports that the last time she had this she had fluid removed and she was fine.  I appreciate IR input.  Spoke to Dr. Fredia Sorrow today.  And ultrasound-guided thoracentesis was not done due to very small amount of fluid present which could improve with IV diuresis.  The benefits of doing a thoracentesis outweigh the risk.  This was explained to both the nieces multiple times in the last 2 days.  Patient is currently on Vanco and cefepime.  With the rising creatinine we will stop the vancomycin.  The recent MRSA PCR in April 2019 was negative.  I do not have one from this admission.  Continue cefepime for another 2 more days. Patient seen by speech evaluation with very mild aspiration risk.  Leukocytosis improving she is hemodynamically stable .  Chest x-ray from today improved aeration decreased diffusion.  2] acute on chronic systolic CHF with low EF 35% patient remains negative over 3.7L .  I have  started her on Lasix IV  40 twice daily keeping in mind her creatinine will increase And DC'd the 60 mg p.o. Lasix.  At the time of admission her  weight was 126 pounds which went up to 130 during this hospital stay and then now back down to 124 today.  Follow daily weights I's and O's.  She is also on Cardizem 120 mg  daily, Lopressor 50 mg twice a day, Imdur 30 mg daily.  3] hypertension her blood pressure remains normal to soft on Cardizem Lopressor Imdur and Lasix.  4] chronic atrial fibrillation on Cardizem and Coumadin and Lopressor.  She is rate controlled and INR therapeutic.  5] CKD stage III baseline creatinine 1.5-1.8.  Creatinine today is 1.37. monitor closely on increased dose of Lasix.  6] hypothyroidism continue Synthroid recent TSH normal.      DVT prophylaxis: Coumadin Code Status: DO NOT RESUSCITATE Family Communication: Discussed with niece every day Disposition Plan TBD  Consultants: Cardiology consult pending   Procedures: None Antimicrobials cefepime  Subjective: Patient reports that she feels better than yesterday.  No nausea vomiting or diarrhea.  No chest pain.  No fever chills.  Cough is better.  Objective: Vitals:   10/19/17 2116 10/19/17 2355 10/20/17 0500 10/20/17 0700  BP:  (!) 141/72  (!) 151/68  Pulse:  77  72  Resp:    17  Temp:  (!) 97.3 F (36.3 C)  (!) 97.5 F (36.4 C)  TempSrc:    Oral  SpO2: 98% 100%  98%  Weight:   56.5 kg (124 lb 9 oz)   Height:        Intake/Output Summary (Last 24 hours) at 10/20/2017 0942 Last data filed at 10/20/2017 0630 Gross per 24 hour  Intake 340 ml  Output 1450 ml  Net -1110 ml   Filed Weights   10/17/17 0359 10/19/17 0500 10/20/17 0500  Weight: 59 kg (130 lb 1.1 oz) 57.7 kg (127 lb 3.3 oz) 56.5 kg (124 lb 9 oz)    Examination:  General exam: Appears calm and comfortable  Respiratory system: Decreased breath sounds at the bases.  Marland Kitchen. Respiratory effort normal. Cardiovascular system: S1 & S2 heard, RRR. No JVD, murmurs, rubs, gallops or clicks. No pedal edema. Gastrointestinal system: Abdomen is nondistended, soft and nontender. No organomegaly or masses felt. Normal bowel sounds heard. Central nervous system: Alert and oriented. No focal neurological deficits. Extremities: Symmetric 5 x 5 power. Skin:  No rashes, lesions or ulcers Psychiatry: Judgement and insight appear normal. Mood & affect appropriate.     Data Reviewed: I have personally reviewed following labs and imaging studies  CBC: Recent Labs  Lab 10/15/17 0951 10/16/17 0342 10/17/17 0315 10/18/17 0223 10/19/17 0314 10/20/17 0508  WBC 23.7* 15.2* 20.7* 18.2* 20.1* 17.1*  NEUTROABS 20.7*  --   --   --   --   --   HGB 12.5 12.2 11.8* 11.9* 13.0 11.5*  HCT 41.2 41.1 38.4 38.1 42.4 37.6  MCV 89.4 90.9 88.5 87.2 88.5 88.5  PLT 231 217 211 207 165 178   Basic Metabolic Panel: Recent Labs  Lab 10/15/17 1352 10/16/17 0342 10/17/17 0315 10/18/17 0223 10/19/17 0314 10/20/17 0508  NA  --  147* 140 141 139 140  K  --  4.3 4.4 3.8 4.5 3.5  CL  --  103 96* 98 99 99  CO2  --  33* 33* 33* 29 31  GLUCOSE  --  140* 147* 101* 145* 109*  BUN  --  46* 57*  52* 47* 38*  CREATININE  --  1.53* 1.57* 1.33* 1.61* 1.37*  CALCIUM  --  9.6 9.4 9.4 9.4 9.1  MG 1.9  --   --   --   --   --    GFR: Estimated Creatinine Clearance: 20.7 mL/min (A) (by C-G formula based on SCr of 1.37 mg/dL (H)). Liver Function Tests: Recent Labs  Lab 10/15/17 0951  AST 16  ALT 31  ALKPHOS 72  BILITOT 1.6*  PROT 5.7*  ALBUMIN 3.0*   No results for input(s): LIPASE, AMYLASE in the last 168 hours. No results for input(s): AMMONIA in the last 168 hours. Coagulation Profile: Recent Labs  Lab 10/16/17 0342 10/17/17 0315 10/18/17 0223 10/19/17 0314 10/20/17 0508  INR 2.46 3.00 3.80 2.59 2.13   Cardiac Enzymes: No results for input(s): CKTOTAL, CKMB, CKMBINDEX, TROPONINI in the last 168 hours. BNP (last 3 results) No results for input(s): PROBNP in the last 8760 hours. HbA1C: No results for input(s): HGBA1C in the last 72 hours. CBG: No results for input(s): GLUCAP in the last 168 hours. Lipid Profile: No results for input(s): CHOL, HDL, LDLCALC, TRIG, CHOLHDL, LDLDIRECT in the last 72 hours. Thyroid Function Tests: No results for  input(s): TSH, T4TOTAL, FREET4, T3FREE, THYROIDAB in the last 72 hours. Anemia Panel: No results for input(s): VITAMINB12, FOLATE, FERRITIN, TIBC, IRON, RETICCTPCT in the last 72 hours. Sepsis Labs: Recent Labs  Lab 10/15/17 1002 10/15/17 1316  LATICACIDVEN 1.01 1.13    Recent Results (from the past 240 hour(s))  Culture, blood (Routine x 2)     Status: None (Preliminary result)   Collection Time: 10/15/17 10:05 AM  Result Value Ref Range Status   Specimen Description BLOOD RIGHT FOREARM  Final   Special Requests   Final    BOTTLES DRAWN AEROBIC AND ANAEROBIC Blood Culture results may not be optimal due to an inadequate volume of blood received in culture bottles   Culture   Final    NO GROWTH 4 DAYS Performed at [facility], [address], [place], [place] [address]    Report Status PENDING  Incomplete  Culture, blood (Routine x 2)     Status: None (Preliminary result)   Collection Time: 10/15/17 10:27 AM  Result Value Ref Range Status   Specimen Description BLOOD RIGHT HAND  Final   Special Requests   Final    BOTTLES DRAWN AEROBIC ONLY Blood Culture results may not be optimal due to an inadequate volume of blood received in culture bottles   Culture   Final    NO GROWTH 4 DAYS Performed at [facility], [address] [place], [place] [address]    Report Status PENDING  Incomplete         Radiology Studies: Dg Chest 1 View  Result Date: 10/20/2017 CLINICAL DATA:  84 year old female with a history of hypoxia and weakness EXAM: CHEST  1 VIEW COMPARISON:  Multiple prior, most recent 10/19/2017, 10/17/2017, 10/15/2017 FINDINGS: Cardiomediastinal silhouette unchanged in size and contour. Cardiomegaly. Improved aeration at the lung bases with blunting of the bilateral costophrenic angles. Coarsened interstitial markings. No new confluent airspace disease. Retained enteric contrast within the left colon. Osteopenia with no acute displaced fracture IMPRESSION:  Improving aeration at the lung bases with decreased size of the pleural effusions. Similar appearance of chronic lung changes and cardiomegaly Electronically Signed   By: Gilmer Mor D.O.   On: 10/20/2017 07:34   Dg Chest 2 View  Result Date:  10/19/2017 CLINICAL DATA:  Hypoxia. EXAM: CHEST - 2 VIEW COMPARISON:  One-view chest x-ray 10/17/2017 FINDINGS: Heart is enlarged. Interstitial edema has increased slightly. Bilateral pleural effusions are present. Bibasilar airspace disease likely reflects atelectasis. Spinal augmentation is noted in the midthoracic spine. Focal kyphosis is present at the same level. IMPRESSION: 1. Cardiomegaly with mild edema and bilateral effusions. 2. Bibasilar airspace disease likely reflects atelectasis. Infection is considered less likely. Electronically Signed   By: Marin Roberts M.D.   On: 10/19/2017 08:05   [place] Chest (pleural Effusion)  Result Date: 10/19/2017 CLINICAL DATA:  Bilateral pleural effusions. EXAM: CHEST ULTRASOUND COMPARISON:  Chest x-ray earlier today. FINDINGS: Ultrasound demonstrates relatively small bilateral pleural effusions and consolidation of both lower lobes. There was not enough fluid present to warrant thoracentesis today. IMPRESSION: Bilateral small pleural effusions. Thoracentesis was not performed due to small fluid volume. Electronically Signed   By: Irish Lack M.D.   On: 10/19/2017 14:00        Scheduled Meds: . allopurinol  100 mg Oral Daily  . atorvastatin  10 mg Oral Daily  . brimonidine  1 drop Both Eyes TID  . cholecalciferol  400 Units Oral Daily  . diltiazem  120 mg Oral Daily  . furosemide  40 mg Intravenous BID  . guaiFENesin  600 mg Oral BID  . ipratropium-albuterol  3 mL Nebulization TID  . isosorbide mononitrate  30 mg Oral Daily  . latanoprost  1 drop Both Eyes QHS  . levothyroxine  88 mcg Oral QAC breakfast  . metoprolol succinate  12.5 mg Oral Daily  . potassium chloride SA  10 mEq Oral Daily  .  timolol  1 drop Both Eyes Daily  . Warfarin - Pharmacist Dosing Inpatient   Does not apply q1800   Continuous Infusions: . ceFEPime (MAXIPIME) IV 1 g (10/19/17 1005)     LOS: 5 days     Alwyn Ren, MD [facility]  If 7PM-7AM, please contact night-coverage [email or web address] Password [facility] 10/20/2017, 9:42 AM

## 2017-10-20 NOTE — Progress Notes (Signed)
Physical Therapy Treatment Patient Details Name: Tonya MoatsMamie N Kuna MRN: [id number] DOB: 06/03/1919 Today's Date: 10/20/2017    History of Present Illness Pt is a 84 y.o. female admitted from [facility] on 10/15/17 with worsening cough and SOB; found to have respiratory failure and sepsis, likely secondary to pneumonia. PMH includes kyphoplasty (09/26/17), CHF, CAD, chronic a-fib, CVA, CKD IV.    PT Comments    Pt reports being very tired today, but is willing to get up to walk and sit in the recliner. Pt requires min A for bed mobility and for sit>stand transfer to RW. Upon standing pt became incontinent of urine, pt requires 3x sit>stand for changing gown and socks as well as to perform pericare. After pericare pt reports increased fatigue, however pt able to ambulate 18 feet to recliner with min A for steadying with RW. D/c plans continue to remain appropriate at this time. PT will continue to follow acutely until d/c.     Follow Up Recommendations  SNF;Supervision for mobility/OOB     Equipment Recommendations  None recommended by PT    Recommendations for Other Services       Precautions / Restrictions Precautions Precautions: Fall;Back;Other (comment) Precaution Comments: Kyphoplasty 09/26/17; urine incontinence  Restrictions Weight Bearing Restrictions: No    Mobility  Bed Mobility Overal bed mobility: Needs Assistance Bed Mobility: Supine to Sit     Supine to sit: Min assist     General bed mobility comments: min A for bringing trunk to upright and pad scoot to EoB  Transfers Overall transfer level: Needs assistance Equipment used: Rolling walker (2 wheeled) Transfers: Sit to/from Stand Sit to Stand: Min guard;Min assist         General transfer comment: minA for steadying in standing, pt incontinent of urine and requires 3 x sit>stand for removing wet gown, and cleaning,   Ambulation/Gait Ambulation/Gait assistance: Min guard Gait Distance (Feet): 18  Feet Assistive device: Rolling walker (2 wheeled) Gait Pattern/deviations: Step-through pattern;Decreased stride length;Trunk flexed Gait velocity: Decreased Gait velocity interpretation: <1.31 ft/sec, indicative of household ambulator General Gait Details: fatigued by standing pericare and only ambulated around bed to recliner         Balance Overall balance assessment: Needs assistance Sitting-balance support: Feet supported;No upper extremity supported Sitting balance-Leahy Scale: Fair     Standing balance support: During functional activity;Bilateral upper extremity supported Standing balance-Leahy Scale: Poor Standing balance comment: requires UE support on RW or therapist for balance                            Cognition Arousal/Alertness: Awake/alert Behavior During Therapy: WFL for tasks assessed/performed Overall Cognitive Status: Within Functional Limits for tasks assessed                                           General Comments General comments (skin integrity, edema, etc.): SaO2 on RA >91%O2 during session       Pertinent Vitals/Pain Pain Assessment: No/denies pain           PT Goals (current goals can now be found in the care plan section) Acute Rehab PT Goals Patient Stated Goal: Finish working with rehab at [facility] to get stronger before returning home PT Goal Formulation: With patient Time For Goal Achievement: 10/30/17 Potential to Achieve Goals: Good Progress towards PT goals: Progressing toward  goals    Frequency    Min 3X/week      PT Plan Current plan remains appropriate       AM-PAC PT "6 Clicks" Daily Activity  Outcome Measure  Difficulty turning over in bed (including adjusting bedclothes, sheets and blankets)?: None Difficulty moving from lying on back to sitting on the side of the bed? : A Little Difficulty sitting down on and standing up from a chair with arms (e.g., wheelchair, bedside commode,  etc,.)?: Unable Help needed moving to and from a bed to chair (including a wheelchair)?: A Little Help needed walking in hospital room?: A Little Help needed climbing 3-5 steps with a railing? : A Lot 6 Click Score: 16    End of Session Equipment Utilized During Treatment: Gait belt Activity Tolerance: Patient tolerated treatment well;Patient limited by fatigue Patient left: in chair;with call bell/phone within reach Nurse Communication: Mobility status PT Visit Diagnosis: Other abnormalities of gait and mobility (R26.89);Muscle weakness (generalized) (M62.81)     Time: 1610-9604 PT Time Calculation (min) (ACUTE ONLY): 21 min  Charges:  $Therapeutic Activity: 8-22 mins                     Salimah Martinovich B. Beverely Risen PT, DPT Acute Rehabilitation  [phone] Pager [phone]     Elon Alas Fleet 10/20/2017, 4:31 PM

## 2017-10-20 NOTE — Progress Notes (Signed)
  Speech Language Pathology Treatment: Dysphagia  Patient Details Name: Tonya Farley MRN: [id number] DOB: 10-Aug-1919 Today's Date: 10/20/2017 Time: 6664-8616 SLP Time Calculation (min) (ACUTE ONLY): 23 min  Assessment / Plan / Recommendation Clinical Impression  Pt eating lunch upon arrival with assist from family friend. Tolerating regular solids, thin liquids without difficulty; reviewed basic esophageal strategies to facilitate esophageal clearance.  No overt s/s of aspiration during meal.  No further SLP f/u is warranted - our services will sign off.   HPI HPI: Pt is a 84 y.o. female admitted from [facility] on 10/15/17 with worsening cough and SOB; found to have respiratory failure and sepsis, likely secondary to pneumonia. CXR with worsening aeration in the lower lobes (LEFT greater than RIGHT), likely atelectasis but cannot exclude PNA. PMH includes kyphoplasty (09/26/17), CHF, CAD, chronic a-fib, CVA, CKD IV. Patient seen by SLP services for swallow evaluation in 2017 in which she demonstrated normal oropharyngeal swallowing function. BSE this admission 7/25 showed what appeared to be functional oropharyngeal swallow although with delayed coughing that could be suggestive of esophageal component. MD ordered MBS to better assess.      SLP Plan  All goals met       Recommendations  Diet recommendations: Regular;Thin liquid Liquids provided via: Cup Medication Administration: Whole meds with liquid Supervision: Patient able to self feed Compensations: Slow rate;Small sips/bites;Follow solids with liquid Postural Changes and/or Swallow Maneuvers: Seated upright 90 degrees                Oral Care Recommendations: Oral care BID Follow up Recommendations: None SLP Visit Diagnosis: Dysphagia, unspecified (R13.10) Plan: All goals met       GO                Juan Quam Laurice 10/20/2017, 1:57 PM  Tonya Farley L. Tivis Ringer, [place] CCC/SLP Pager [phone]

## 2017-10-20 NOTE — Consult Note (Signed)
Cardiology Consultation:   Patient ID: Tonya Farley; [id number]; 1919/05/11   Admit date: 10/15/2017 Date of Consult: 10/20/2017  Primary Care Provider: Burton Apley, MD Primary Cardiologist: Lance Muss, MD Primary Electrophysiologist:  None   Patient Profile:   Tonya Farley is a 84 y.o. female with a PMH of chronic combined CHF (EF 30-35% 10/07/17), pulmonary HTN, permanent atrial fibrillation, hypothyroidsim, recent admission for weakness and confusion where she was found to have a subacute stroke and rhinovirus, who is being seen today for the evaluation of CHF at the request of Dr. Jerolyn Center.  History of Present Illness:   Tonya Farley was recently admitted to the hospital from 10/04/17-10/08/17 for weakness and confusion. She was found to have a subacute stroke and rhinovirus, for which she was discharged home on nebulizers, robitussin, and a prednisone taper. Unfortunately she continued to feel poorly and reported a worsening cough since discharge from the hospital, acutely worsening 10/14/17 with associated SOB, prompting her to present to the ED for further evaluation.   She underwent an echocardiogram during her previous admission which revealed an EF 30-35% (35-40% 06/2017), with diffuse hypokinesis, moderate-severe TR, and severe pulm HTN. She was last evaluated by Dr. Eldridge Dace 09/05/17 and was thought to be doing well from a cardiac standpoint. No medication changes occurred. She has been noted to be intolerant to revatio in the past as management of her pulmonary HTN.   At the time of this evaluation, she reports feeling overall improved however not back to baseline. Coughing continues to be her primary complaint. She denies SOB at this time. She denies CP, orthopnea, PND, LE edema, or recent weight changes prior to admission. She states she has not required her prn metolazone dosing for quite some time.  Hospital course: Today she is mildly hypertensive, otherwise  VSS; on O2 via [place] (baseline is RA). Labs notable for K 3.5, Cr 1.37 (improved baseline), WBC 17.1, Hgb 11.5, PLT 178, BNP 1050>859, INR 2.13 today. EKG with atrial fibrillation, rate 99, LVH and IVCD, no STE/D, no TWI. CXR with mild edema and bilateral effusions, and bibasilar airspace disease likely reflective of atelectasis vs. less likely infection. IR was consulted, however pleural effusions were small and not amenable to thoracentesis. CXR today shows improvement in aeration at lung bases and decreased size of pleural effusions after being started on IV lasix 40mg  BID from home 60mg  po dosing. She has been receiving IV antibiotics for HCAP and supportive care with cough suppressants and nebulizers. Cardiology asked to evaluate the patient for acute on chronic combined CHF.    Past Medical History:  Diagnosis Date  . Chronic a-fib (HCC)   . Chronic back pain   . Chronic systolic (congestive) heart failure (HCC)    reduced EF since 2018 to 35-40%  . CKD (chronic kidney disease) stage 4, GFR 15-29 ml/min (HCC) 09/11/2015  . Hyperlipidemia   . Hypertension   . Hypothyroidism   . Pulmonary hypertension (HCC)    PASP 65 mmHg at Echo 3/17  . Thyroid disease     Past Surgical History:  Procedure Laterality Date  . APPENDECTOMY    . cataracts     bilateral  . EYE SURGERY     left eye "hole"  . IR KYPHO THORACIC WITH BONE BIOPSY  09/26/2017  . IR RADIOLOGIST EVAL & MGMT  09/18/2017  . TONSILLECTOMY       Home Medications:  Prior to Admission medications   Medication Sig Start Date End Date  Taking? Authorizing Provider  acetaminophen (TYLENOL) 500 MG tablet Take 1 tablet (500 mg total) by mouth every 6 (six) hours as needed (pain). 10/08/17  Yes Rai, Ripudeep K, MD  allopurinol (ZYLOPRIM) 300 MG tablet Take 150 mg by mouth daily.    Yes [provider]  atorvastatin (LIPITOR) 10 MG tablet Take 10 mg by mouth daily.   Yes [provider]  B Complex Vitamins (VITAMIN B  COMPLEX) TABS Take 1 tablet by mouth daily with lunch.   Yes [provider]  brimonidine (ALPHAGAN) 0.15 % ophthalmic solution Place 1 drop into both eyes 3 (three) times daily.   Yes [provider]  cholecalciferol (VITAMIN D) 400 units TABS tablet Take 400 Units by mouth daily.   Yes [provider]  diltiazem (CARDIZEM CD) 120 MG 24 hr capsule Take 1 capsule (120 mg total) by mouth daily. 08/26/17  Yes Corky Crafts, MD  furosemide (LASIX) 80 MG tablet TAKE 1 TABLET BY MOUTH EVERY DAY 04/28/17  Yes Corky Crafts, MD  guaiFENesin-dextromethorphan [facility] DM) 100-10 MG/5ML syrup Take 5 mLs by mouth every 4 (four) hours as needed for cough. 10/08/17  Yes Rai, Ripudeep K, MD  isosorbide mononitrate (IMDUR) 30 MG 24 hr tablet Take 1 tablet (30 mg total) by mouth daily. 09/05/17  Yes Corky Crafts, MD  latanoprost (XALATAN) 0.005 % ophthalmic solution Place 1 drop into both eyes at bedtime. 01/10/14  Yes [provider]  levalbuterol (XOPENEX) 0.63 MG/3ML nebulizer solution Take 3 mLs (0.63 mg total) by nebulization 3 (three) times daily for 20 days. And q4hours as needed for SOB/wheezing Patient taking differently: Take 0.63 mg by nebulization See admin instructions. Take 3 mls by Nebulization three times daily for 20 days, then change to every 4 hours as needed for shortness of breath and wheezing 10/08/17 10/28/17 Yes Rai, Ripudeep K, MD  levothyroxine (SYNTHROID, LEVOTHROID) 88 MCG tablet Take 88 mcg by mouth daily before breakfast.   Yes [provider]  metolazone (ZAROXOLYN) 2.5 MG tablet Take 1 tablet (2.5 mg total) by mouth as needed (take if weight goes up 3 lbs in 1 day or 5 lbs in 1 week). 10/27/15  Yes Weaver, Scott T, PA-C  metoprolol tartrate (LOPRESSOR) 50 MG tablet Take 1 tablet (50 mg total) by mouth 2 (two) times daily. 10/08/17  Yes Rai, Ripudeep K, MD  mometasone-formoterol (DULERA) 100-5 MCG/ACT AERO Inhale 2 puffs into the  lungs 2 (two) times daily. 10/08/17  Yes Rai, Ripudeep K, MD  ondansetron (ZOFRAN) 4 MG tablet Take 4 mg by mouth every 4 (four) hours as needed for nausea or vomiting.   Yes [provider]  OVER THE COUNTER MEDICATION Take 240 mLs by mouth 2 (two) times daily. Med pass 2.0   Yes [provider]  OXYGEN Inhale 2 L into the lungs See admin instructions. To keep stats greater than 90% check sats every shift   Yes [provider]  Polyethyl Glycol-Propyl Glycol (SYSTANE OP) Apply 2 drops to eye daily as needed (dry eyes).    Yes [provider]  potassium chloride SA (K-DUR,KLOR-CON) 20 MEQ tablet Take 0.5 tablets (10 mEq total) by mouth daily. 07/14/17  Yes Elgergawy, Leana Roe, MD  predniSONE (DELTASONE) 10 MG tablet Prednisone dosing: Take  Prednisone 40mg  (4 tabs) x 2 days, then taper to 30mg  (3 tabs) x 3 days, then 20mg  (2 tabs) x 3days, then 10mg  (1 tab) x 3days, then OFF. 10/08/17  Yes  Rai, Ripudeep K, MD  timolol (TIMOPTIC) 0.5 % ophthalmic solution Place 1 drop into both eyes daily.  12/28/13  Yes [provider]  traMADol (ULTRAM) 50 MG tablet Take 50 mg by mouth every 8 (eight) hours as needed for moderate pain.    Yes [provider]  spironolactone (ALDACTONE) 25 MG tablet Take 25 mg by mouth daily.    [provider]  warfarin (COUMADIN) 1 MG tablet Take 2.5 tablets daily as directed by Coumadin clinic Patient taking differently: Take 2.5 mg by mouth daily after supper. or as directed by Coumadin clinic 09/17/17   Corky CraftsVaranasi, Jayadeep S, MD  warfarin (COUMADIN) 2 MG tablet Take 2 mg by mouth at bedtime.    [provider]    Inpatient Medications: Scheduled Meds: . allopurinol  100 mg Oral Daily  . atorvastatin  10 mg Oral Daily  . brimonidine  1 drop Both Eyes TID  . cholecalciferol  400 Units Oral Daily  . diltiazem  120 mg Oral Daily  . furosemide  40 mg Intravenous BID  . guaiFENesin  600 mg Oral BID  .  ipratropium-albuterol  3 mL Nebulization TID  . isosorbide mononitrate  30 mg Oral Daily  . latanoprost  1 drop Both Eyes QHS  . levothyroxine  88 mcg Oral QAC breakfast  . metoprolol succinate  12.5 mg Oral Daily  . potassium chloride SA  10 mEq Oral Daily  . timolol  1 drop Both Eyes Daily  . warfarin  2 mg Oral q1800  . Warfarin - Pharmacist Dosing Inpatient   Does not apply q1800   Continuous Infusions: . ceFEPime (MAXIPIME) IV 1 g (10/20/17 1000)  . potassium chloride     PRN Meds: acetaminophen **OR** acetaminophen, albuterol, bisacodyl, guaiFENesin-codeine, HYDROcodone-acetaminophen, ondansetron **OR** ondansetron (ZOFRAN) IV, senna-docusate, traMADol  Allergies:    Allergies  Allergen Reactions  . Penicillins Hives and Swelling    Has patient had a PCN reaction causing immediate rash, facial/tongue/throat swelling, SOB or lightheadedness with hypotension: Yes Has patient had a PCN reaction causing severe rash involving mucus membranes or skin necrosis: Yes Has patient had a PCN reaction that required hospitalization No Has patient had a PCN reaction occurring within the last 10 years: No If all of the above answers are "NO", then may proceed with Cephalosporin use.    . Sulfa Antibiotics Anaphylaxis and Swelling  . Fish Allergy Nausea And Vomiting    Social History:   Social History   Socioeconomic History  . Marital status: Widowed    Spouse name: Not on file  . Number of children: Not on file  . Years of education: Not on file  . Highest education level: Not on file  Occupational History  . Not on file  Social Needs  . Financial resource strain: Not on file  . Food insecurity:    Worry: Patient refused    Inability: Patient refused  . Transportation needs:    Medical: Not on file    Non-medical: Not on file  Tobacco Use  . Smoking status: Never Smoker  . Smokeless tobacco: Never Used  Substance and Sexual Activity  . Alcohol use: No  . Drug use: No    . Sexual activity: Not Currently    Birth control/protection: None  Lifestyle  . Physical activity:    Days per week: Not on file    Minutes per session: Not on file  . Stress: Not on file  Relationships  . Social connections:  Talks on phone: Not on file    Gets together: Not on file    Attends religious service: Not on file    Active member of club or organization: Not on file    Attends meetings of clubs or organizations: Not on file    Relationship status: Not on file  . Intimate partner violence:    Fear of current or ex partner: Not on file    Emotionally abused: Not on file    Physically abused: Not on file    Forced sexual activity: Not on file  Other Topics Concern  . Not on file  Social History Narrative  . Not on file    Family History:    Family History  Problem Relation Age of Onset  . Heart disease Sister   . Heart disease Sister   . Cancer Mother   . Other Father   . Heart attack Father      ROS:  Please see the history of present illness.  Review of Systems  All other systems reviewed and are negative.   All other ROS reviewed and negative.     Physical Exam/Data:   Vitals:   10/19/17 2355 10/20/17 0500 10/20/17 0700 10/20/17 1411  BP: (!) 141/72  (!) 151/68   Pulse: 77  72   Resp:   17   Temp: (!) 97.3 F (36.3 C)  (!) 97.5 F (36.4 C)   TempSrc:   Oral   SpO2: 100%  98% 93%  Weight:  124 lb 9 oz (56.5 kg)    Height:        Intake/Output Summary (Last 24 hours) at 10/20/2017 1415 Last data filed at 10/20/2017 0630 Gross per 24 hour  Intake 240 ml  Output 1450 ml  Net -1210 ml   Filed Weights   10/17/17 0359 10/19/17 0500 10/20/17 0500  Weight: 130 lb 1.1 oz (59 kg) 127 lb 3.3 oz (57.7 kg) 124 lb 9 oz (56.5 kg)   Body mass index is 21.38 kg/m.  General:  Pleasant elderly female laying in bed in no acute distress HEENT: sclera anicteric  Neck: no JVD Vascular: No carotid bruits; distal pulses 2+ bilaterally Cardiac:  normal  S1, S2; IRRR; no murmurs, rubs, or gallops. Lungs:  Diffuse rhonchi with decreased breath sounds at lung bases Abd: NABS, soft, nontender, no hepatomegaly Ext: no edema Musculoskeletal:  No deformities, BUE and BLE strength normal and equal Skin: warm and dry; multiple areas of ecchymosis Neuro:  CNs 2-12 intact, no focal abnormalities noted Psych:  Normal affect   EKG:  The EKG was personally reviewed and demonstrates:  atrial fibrillation, rate 99, LVH and IVCD, no STE/D, no TWI  Relevant CV Studies: Echocardiogram 10/07/17: ------------------------------------------------------------------- Study Conclusions  - Left ventricle: The cavity size was normal. Systolic function was   moderately to severely reduced. The estimated ejection fraction   was in the range of 30% to 35%. Diffuse hypokinesis. - Aortic valve: There was trivial regurgitation. - Mitral valve: Calcified annulus. Mildly thickened leaflets . - Left atrium: The atrium was severely dilated. - Right ventricle: The cavity size was mildly dilated. - Right atrium: The atrium was severely dilated. - Tricuspid valve: There was moderate-severe regurgitation. - Pulmonary arteries: Systolic pressure was severely increased. PA   peak pressure: 71 mm Hg (S).  Impressions:  - Moderate to severe global reduction in LV systolic function;   trace AI; severe biatrial enlargement; mild RVE; moderate to   severe TR with severe  pulmonary hypertension.  Laboratory Data:  Chemistry Recent Labs  Lab 10/18/17 0223 10/19/17 0314 10/20/17 0508  NA 141 139 140  K 3.8 4.5 3.5  CL 98 99 99  CO2 33* 29 31  GLUCOSE 101* 145* 109*  BUN 52* 47* 38*  CREATININE 1.33* 1.61* 1.37*  CALCIUM 9.4 9.4 9.1  GFRNONAA 33* 26* 31*  GFRAA 38* 30* 36*  ANIONGAP 10 11 10     Recent Labs  Lab 10/15/17 0951  PROT 5.7*  ALBUMIN 3.0*  AST 16  ALT 31  ALKPHOS 72  BILITOT 1.6*   Hematology Recent Labs  Lab 10/18/17 0223 10/19/17 0314  10/20/17 0508  WBC 18.2* 20.1* 17.1*  RBC 4.37 4.79 4.25  HGB 11.9* 13.0 11.5*  HCT 38.1 42.4 37.6  MCV 87.2 88.5 88.5  MCH 27.2 27.1 27.1  MCHC 31.2 30.7 30.6  RDW 17.9* 17.9* 17.8*  PLT 207 165 178   Cardiac EnzymesNo results for input(s): TROPONINI in the last 168 hours.  Recent Labs  Lab 10/15/17 1002  TROPIPOC 0.04    BNP Recent Labs  Lab 10/15/17 1010 10/19/17 0314  BNP 1,050.5* 859.8*    DDimer No results for input(s): DDIMER in the last 168 hours.  Radiology/Studies:  Dg Chest 1 View  Result Date: 10/20/2017 CLINICAL DATA:  84 year old female with a history of hypoxia and weakness EXAM: CHEST  1 VIEW COMPARISON:  Multiple prior, most recent 10/19/2017, 10/17/2017, 10/15/2017 FINDINGS: Cardiomediastinal silhouette unchanged in size and contour. Cardiomegaly. Improved aeration at the lung bases with blunting of the bilateral costophrenic angles. Coarsened interstitial markings. No new confluent airspace disease. Retained enteric contrast within the left colon. Osteopenia with no acute displaced fracture IMPRESSION: Improving aeration at the lung bases with decreased size of the pleural effusions. Similar appearance of chronic lung changes and cardiomegaly Electronically Signed   By: Gilmer Mor D.O.   On: 10/20/2017 07:34   Dg Chest 1 View  Result Date: 10/17/2017 CLINICAL DATA:  84 year old female with cough for several days. EXAM: CHEST  1 VIEW COMPARISON:  Chest radiograph 10/15/2017 and earlier. FINDINGS: Portable AP semi upright view at 1209 hours. Persistent small bilateral pleural effusions. Stable pulmonary vascularity, no acute edema. No pneumothorax. Lung base atelectasis, but no consolidation identified. Stable cardiomegaly and mediastinal contours. Calcified aortic atherosclerosis. Chronic lower thoracic augmented compression fracture. Negative visible bowel gas pattern. IMPRESSION: 1. Small pleural effusions with atelectasis are stable since 10/15/2017. 2. No  new cardiopulmonary abnormality. 3. Chronic cardiomegaly, Aortic Atherosclerosis (ICD10-I70.0). Electronically Signed   By: Odessa Fleming M.D.   On: 10/17/2017 12:34   Dg Chest 2 View  Result Date: 10/19/2017 CLINICAL DATA:  Hypoxia. EXAM: CHEST - 2 VIEW COMPARISON:  One-view chest x-ray 10/17/2017 FINDINGS: Heart is enlarged. Interstitial edema has increased slightly. Bilateral pleural effusions are present. Bibasilar airspace disease likely reflects atelectasis. Spinal augmentation is noted in the midthoracic spine. Focal kyphosis is present at the same level. IMPRESSION: 1. Cardiomegaly with mild edema and bilateral effusions. 2. Bibasilar airspace disease likely reflects atelectasis. Infection is considered less likely. Electronically Signed   By: Marin Roberts M.D.   On: 10/19/2017 08:05   [place] Chest (pleural Effusion)  Result Date: 10/19/2017 CLINICAL DATA:  Bilateral pleural effusions. EXAM: CHEST ULTRASOUND COMPARISON:  Chest x-ray earlier today. FINDINGS: Ultrasound demonstrates relatively small bilateral pleural effusions and consolidation of both lower lobes. There was not enough fluid present to warrant thoracentesis today. IMPRESSION: Bilateral small pleural effusions. Thoracentesis was not performed due to  small fluid volume. Electronically Signed   By: Irish Lack M.D.   On: 10/19/2017 14:00   Dg Swallowing Func-speech Pathology  Result Date: 10/17/2017 Objective Swallowing Evaluation: Type of Study: MBS-Modified Barium Swallow Study  Patient Details Name: JENTRY MCQUEARY MRN: [id number] Date of Birth: 1919-05-14 Today's Date: 10/17/2017 Time: SLP Start Time (ACUTE ONLY): 1447 -SLP Stop Time (ACUTE ONLY): 1505 SLP Time Calculation (min) (ACUTE ONLY): 18 min Past Medical History: Past Medical History: Diagnosis Date . Chronic a-fib (HCC)  . Chronic back pain  . Chronic systolic (congestive) heart failure (HCC)   reduced EF since 2018 to 35-40% . CKD (chronic kidney disease) stage 4, GFR  15-29 ml/min (HCC) 09/11/2015 . Hyperlipidemia  . Hypertension  . Hypothyroidism  . Pulmonary hypertension (HCC)   PASP 65 mmHg at Echo 3/17 . Thyroid disease  Past Surgical History: Past Surgical History: Procedure Laterality Date . APPENDECTOMY   . cataracts    bilateral . EYE SURGERY    left eye "hole" . IR KYPHO THORACIC WITH BONE BIOPSY  09/26/2017 . IR RADIOLOGIST EVAL & MGMT  09/18/2017 . TONSILLECTOMY   HPI: Pt is a 84 y.o. female admitted from [facility] on 10/15/17 with worsening cough and SOB; found to have respiratory failure and sepsis, likely secondary to pneumonia. CXR with worsening aeration in the lower lobes (LEFT greater than RIGHT), likely atelectasis but cannot exclude PNA. PMH includes kyphoplasty (09/26/17), CHF, CAD, chronic a-fib, CVA, CKD IV. Patient seen by SLP services for swallow evaluation in 2017 in which she demonstrated normal oropharyngeal swallowing function. BSE this admission 7/25 showed what appeared to be functional oropharyngeal swallow although with delayed coughing that could be suggestive of esophageal component. MD ordered MBS to better assess.  Subjective: pt alert, pleasant, says she swallows well but sometimes has some trouble with her dentures Assessment / Plan / Recommendation CHL IP CLINICAL IMPRESSIONS 10/17/2017 Clinical Impression Pt's oropharyngeal swallow is within gross functional limits for her age. She had one episode of trace penetration that reached her vocal folds, which elicited a strong, reflexive cough response to clear. No other airway compromise was noted. She had trace residue with thin liquids that cleared with additional swallows. During brief esophageal scan, pt appeared to have a column of barium remaining after only a few solid trials. With additional thin liquid boluses it appeared much clearer. MD not present to confirm findings; may wish to consider further esophageal w/u if indicated. Would continue with regular diet and thin liquids using  aspiration and esophageal precautions - particularly alternating between solids/liquids. Will f/u briefly for additional education, training, and monitoring for tolerance. SLP Visit Diagnosis Dysphagia, unspecified (R13.10) Attention and concentration deficit following -- Frontal lobe and executive function deficit following -- Impact on safety and function Mild aspiration risk   CHL IP TREATMENT RECOMMENDATION 10/17/2017 Treatment Recommendations Therapy as outlined in treatment plan below   No flowsheet data found. CHL IP DIET RECOMMENDATION 10/17/2017 SLP Diet Recommendations Regular solids;Thin liquid Liquid Administration via Cup;Straw Medication Administration Crushed with puree Compensations Slow rate;Small sips/bites;Follow solids with liquid Postural Changes Remain semi-upright after after feeds/meals (Comment);Seated upright at 90 degrees   CHL IP OTHER RECOMMENDATIONS 10/17/2017 Recommended Consults -- Oral Care Recommendations Oral care BID Other Recommendations --   CHL IP FOLLOW UP RECOMMENDATIONS 10/17/2017 Follow up Recommendations None   CHL IP FREQUENCY AND DURATION 10/17/2017 Speech Therapy Frequency (ACUTE ONLY) min 1 x/week Treatment Duration 1 week      CHL IP ORAL PHASE  10/17/2017 Oral Phase WFL Oral - Pudding Teaspoon -- Oral - Pudding Cup -- Oral - Honey Teaspoon -- Oral - Honey Cup -- Oral - Nectar Teaspoon -- Oral - Nectar Cup -- Oral - Nectar Straw -- Oral - Thin Teaspoon -- Oral - Thin Cup -- Oral - Thin Straw -- Oral - Puree -- Oral - Mech Soft -- Oral - Regular -- Oral - Multi-Consistency -- Oral - Pill -- Oral Phase - Comment --  CHL IP PHARYNGEAL PHASE 10/17/2017 Pharyngeal Phase Impaired Pharyngeal- Pudding Teaspoon -- Pharyngeal -- Pharyngeal- Pudding Cup -- Pharyngeal -- Pharyngeal- Honey Teaspoon -- Pharyngeal -- Pharyngeal- Honey Cup -- Pharyngeal -- Pharyngeal- Nectar Teaspoon -- Pharyngeal -- Pharyngeal- Nectar Cup -- Pharyngeal -- Pharyngeal- Nectar Straw -- Pharyngeal --  Pharyngeal- Thin Teaspoon -- Pharyngeal -- Pharyngeal- Thin Cup -- Pharyngeal -- Pharyngeal- Thin Straw Penetration/Aspiration before swallow Pharyngeal Material enters airway, CONTACTS cords and then ejected out Pharyngeal- Puree -- Pharyngeal -- Pharyngeal- Mechanical Soft -- Pharyngeal -- Pharyngeal- Regular -- Pharyngeal -- Pharyngeal- Multi-consistency -- Pharyngeal -- Pharyngeal- Pill -- Pharyngeal -- Pharyngeal Comment --  CHL IP CERVICAL ESOPHAGEAL PHASE 10/17/2017 Cervical Esophageal Phase WFL Pudding Teaspoon -- Pudding Cup -- Honey Teaspoon -- Honey Cup -- Nectar Teaspoon -- Nectar Cup -- Nectar Straw -- Thin Teaspoon -- Thin Cup -- Thin Straw -- Puree -- Mechanical Soft -- Regular -- Multi-consistency -- Pill -- Cervical Esophageal Comment -- Maxcine Ham 10/17/2017, 3:38 PM  Maxcine Ham, M.A. CCC-SLP [phone]              Assessment and Plan:   1. Acute on chronic respiratory failure: thought to primarily be 2/2 HCAP given recent admission for viral bronchitis and likely contributed to by acute on chronic combined CHF. She has been managed with IV antibiotics and was started on IV diuretics yesterday with improvement in symptoms. CXR today shows improved aeration and decrease in pleural effusions. She has diuresed well with net -1.1L in the past 24 hours, with -3.7L UOP this admission. Weight trend 129 on admission>126>130>124lbs today. Cr stable at 1.37 today. BNP 1050 on admission > 859 today.  - Continue IV lasix today and can likely transition to home po lasix tomorrow - Continue metoprolol succinate - Resume home spironolactone if CrCl improves to >30.  - Continue antibiotics and supportive care per primary team - Wean O2 as tolerated  2. Permanent atrial fibrillation: rate well controlled over the past 24 hours. Not on telemetry at this time. She has been on metoprolol and diltiazem for rate control. On coumadin with a therapeutic INR of 2.3 today, - Continue coumadin for   CHA2DS2-VASc Score of 6 (CHF, HTN, Age >75, stroke)  - Continue metoprolol and diltiazem for rate control. Diltiazem not ideal given systolic CHF, however appears to have issues with RVR in the past when this medication was discontinued  3. HTN: BP stable - Continue metoprolol and diltiazem   4. Pulmonary HTN: Has been intolerant to revatio in the past. Currently on imdur - Continue imdur  5. CKD stage III-IV: Cr 1.37 today, improved from 1.61 yesterday with IV diuresis.  - Continue to monitor closely  For questions or updates, please contact [facility] Please consult [email or web address] for contact info under Cardiology/STEMI.   Signed, Beatriz Stallion, PA-C  10/20/2017 2:15 PM [phone]

## 2017-10-20 NOTE — Progress Notes (Signed)
ANTICOAGULATION CONSULT NOTE  Pharmacy Consult for warfarin/cefepime Indication: atrial fibrillation/PNA  Allergies  Allergen Reactions  . Penicillins Hives and Swelling    Has patient had a PCN reaction causing immediate rash, facial/tongue/throat swelling, SOB or lightheadedness with hypotension: Yes Has patient had a PCN reaction causing severe rash involving mucus membranes or skin necrosis: Yes Has patient had a PCN reaction that required hospitalization No Has patient had a PCN reaction occurring within the last 10 years: No If all of the above answers are "NO", then may proceed with Cephalosporin use.    . Sulfa Antibiotics Anaphylaxis and Swelling  . Fish Allergy Nausea And Vomiting    Labs: Recent Labs    10/18/17 0223 10/19/17 0314 10/20/17 0508  HGB 11.9* 13.0 11.5*  HCT 38.1 42.4 37.6  PLT 207 165 178  LABPROT 37.2* 27.5* 23.6*  INR 3.80 2.59 2.13  CREATININE 1.33* 1.61* 1.37*    Estimated Creatinine Clearance: 20.7 mL/min (A) (by C-G formula based on SCr of 1.37 mg/dL (H)).  Assessment: [age] yof presented to the ED with worsening SOB. She is on chronic warfarin for history of afib.   However, she has been on doxycyline and pt received a dose of azithromycin on 7/24. These can increase the INR. Plan to stop cefepime in 2 days.   INR therapeutic at 2.13 CBC is WNL  Nurse noted episode of emesis overnight.   Goal of Therapy:  INR 2-3 Monitor platelets by anticoagulation protocol: Yes   Plan:   Resume coumadin 2mg  PO qday Put stop date for cefepime in place Daily INR  Ulyses SouthwardMinh Pham, PharmD, Suzan NailerBCIDP, AAHIVP, CPP Infectious Disease Pharmacist Pager: [phone] 10/20/2017 10:37 AM

## 2017-10-21 ENCOUNTER — Inpatient Hospital Stay (HOSPITAL_COMMUNITY): Payer: Medicare Other

## 2017-10-21 LAB — CBC
HCT: 37.4 % (ref 36.0–46.0)
Hemoglobin: 11.5 g/dL — ABNORMAL LOW (ref 12.0–15.0)
MCH: 26.9 pg (ref 26.0–34.0)
MCHC: 30.7 g/dL (ref 30.0–36.0)
MCV: 87.6 fL (ref 78.0–100.0)
PLATELETS: 193 10*3/uL (ref 150–400)
RBC: 4.27 MIL/uL (ref 3.87–5.11)
RDW: 18 % — ABNORMAL HIGH (ref 11.5–15.5)
WBC: 19.6 10*3/uL — AB (ref 4.0–10.5)

## 2017-10-21 LAB — BASIC METABOLIC PANEL
ANION GAP: 10 (ref 5–15)
BUN: 40 mg/dL — ABNORMAL HIGH (ref 8–23)
CALCIUM: 9.2 mg/dL (ref 8.9–10.3)
CO2: 31 mmol/L (ref 22–32)
CREATININE: 1.49 mg/dL — AB (ref 0.44–1.00)
Chloride: 99 mmol/L (ref 98–111)
GFR calc Af Amer: 33 mL/min — ABNORMAL LOW (ref >60)
GFR, EST NON AFRICAN AMERICAN: 28 mL/min — AB (ref >60)
Glucose, Bld: 124 mg/dL — ABNORMAL HIGH (ref 70–99)
Potassium: 3.8 mmol/L (ref 3.5–5.1)
Sodium: 140 mmol/L (ref 135–145)

## 2017-10-21 LAB — PROTIME-INR
INR: 2.22
Prothrombin Time: 24.5 seconds — ABNORMAL HIGH (ref 11.4–15.2)

## 2017-10-21 LAB — MAGNESIUM: MAGNESIUM: 2.1 mg/dL (ref 1.7–2.4)

## 2017-10-21 MED ORDER — AMOXICILLIN-POT CLAVULANATE 500-125 MG PO TABS: 1.0000 | ORAL_TABLET | Freq: Two times a day (BID) | ORAL | Status: DC

## 2017-10-21 MED ORDER — IPRATROPIUM-ALBUTEROL 0.5-2.5 (3) MG/3ML IN SOLN: 3.0000 mL | RESPIRATORY_TRACT | Status: DC | PRN

## 2017-10-21 MED ORDER — FUROSEMIDE 40 MG PO TABS
40.0000 mg | ORAL_TABLET | Freq: Two times a day (BID) | ORAL | Status: DC
  Administered 2017-10-21 – 2017-10-22 (×2): 40 mg via ORAL
  Filled 2017-10-21 (×2): qty 1

## 2017-10-21 MED ORDER — CEFDINIR 300 MG PO CAPS
300.0000 mg | ORAL_CAPSULE | Freq: Every day | ORAL | Status: DC
  Administered 2017-10-21 – 2017-10-22 (×2): 300 mg via ORAL
  Filled 2017-10-21 (×2): qty 1

## 2017-10-21 NOTE — Progress Notes (Signed)
ANTICOAGULATION CONSULT NOTE  Pharmacy Consult for warfarin/cefepime Indication: atrial fibrillation/PNA  Allergies  Allergen Reactions  . Penicillins Hives and Swelling    Has patient had a PCN reaction causing immediate rash, facial/tongue/throat swelling, SOB or lightheadedness with hypotension: Yes Has patient had a PCN reaction causing severe rash involving mucus membranes or skin necrosis: Yes Has patient had a PCN reaction that required hospitalization No Has patient had a PCN reaction occurring within the last 10 years: No If all of the above answers are "NO", then may proceed with Cephalosporin use.    . Sulfa Antibiotics Anaphylaxis and Swelling  . Fish Allergy Nausea And Vomiting    Labs: Recent Labs    10/19/17 0314 10/20/17 0508 10/21/17 0343  HGB 13.0 11.5* 11.5*  HCT 42.4 37.6 37.4  PLT 165 178 193  LABPROT 27.5* 23.6* 24.5*  INR 2.59 2.13 2.22  CREATININE 1.61* 1.37* 1.49*    Estimated Creatinine Clearance: 19.1 mL/min (A) (by C-G formula based on SCr of 1.49 mg/dL (H)).  Assessment: [age] yof presented to the ED with worsening SOB. She is on chronic warfarin for history of afib.   However, she has been on doxycyline and pt received a dose of azithromycin on 7/24. These can increase the INR. Plan to stop cefepime in 2 days.   INR therapeutic at 2.22 CBC is WNL  Nurse noted episode of emesis overnight.   Goal of Therapy:  INR 2-3 Monitor platelets by anticoagulation protocol: Yes   Plan:   Continue coumadin 2mg  PO qday Daily INR  Ulyses SouthwardMinh Pham, PharmD, MorrisBCIDP, AAHIVP, CPP Infectious Disease Pharmacist Pager: [phone] 10/21/2017 9:37 AM

## 2017-10-21 NOTE — Care Management Important Message (Signed)
Important Message  Patient Details  Name: Tonya Farley MRN: [id number] Date of Birth: 04/29/1919   Medicare Important Message Given:  Yes    Bernadette HoitShoffner, Delmar Dondero Coleman 10/21/2017, 11:28 AM

## 2017-10-21 NOTE — Progress Notes (Signed)
PROGRESS NOTE    Tonya Farley  ZOX:[id number] DOB: 10/11/1919 DOA: 10/15/2017 PCP: Burton Apleyoberts, Ronald, MD  Brief Narrative:84 y.o.femalewith medical history significantfor chronic systolic heart failure, CAD, A. Fib, CVA, chronic kidney disease, hypothyroidism presents to the emergency Department chief complaint persistent worsening cough   Patient was recently hospitalized for rhinovirus bronchitis. During that hospitalization she was evaluated for weakness and confusion with a brain MRI that showed a subacute stroke. She passed her swallow eval and was eventually discharged on July 17 with Robitussin and Xopenex nebs Dulera and a prednisone taper. She she states that she never felt any better and continued to cough even at the time of discharge. Over the last day or so she said her cough is gotten worse. She reports a constant wet sounding nonproductive cough. Last night and this morning she felt quite short of breath. She remembers the staff at the facility "shaking me last night". No reports of any fever chills diarrhea nausea vomiting. No lower extremity edema. This morning the staff at the facility found her quite lethargic short of breath and hypoxic. Of note patient was discharged July 17 without oxygen as she "didn't need it". EMS was called  ED Course:in the emergency department she's afebrile and normotensive tachycardia with tachypnea and an oxygen saturation level of 88% on room air.she was provided with supplemental oxygen and her oxygen saturation level improved to greater than 90%. Provided with gentle IV fluids and broad-spectrum antibiotics for healthcare associated pneumonia     Assessment & Plan:   Principal Problem:   Acute on chronic respiratory failure with hypoxia (HCC) Active Problems:   Atrial fibrillation (HCC)   Pleural effusion, bacterial   HCAP (healthcare-associated pneumonia)   Chronic systolic CHF (congestive heart failure) (HCC), EF 35%    Hypokalemia   Hypothyroidism   HTN (hypertension)   CKD (chronic kidney disease) stage 4, GFR 15-29 ml/min (HCC)   Leukocytosis   Sepsis (HCC)   Hypernatremia   Congestive heart failure (HCC)   1]acute on chronic hypoxic respiratory failure secondary to healthcare associated pneumonia recent viral bronchitis and acute on chronic systolic heart failure with ejection fraction 35%-patient continued to be oxygen dependent at 2 L. At the time of admission she was 88% on room air. Chest x-ray has been followed almost daily.Today's chest x-ray shows bilateral effusion but very small amount of fluid. IR consult was placed per family request thoracentesis was not done as there was not enough fluid to drain. I appreciate IR input. The benefits of doing a thoracentesis outweigh the risk. This was explained to both the nieces multiple times in the last 2 days.  The patient was treated with vancomycin and cefepime.  Vancomycin was stopped and cefepime continued Till discharge.Patient seen by speech evaluation with very mild aspiration risk.  Patient still has leukocytosis which has increased since IV Lasix was started so I do think this is related to hemoconcentration  Since she is improving clinically.  2]acute on chronic systolic CHF with low EF 35% patient remains negative over 3.7L.  DC IV Lasix and start p.o. Lasix. Follow daily weights I's and O's. She is also on Cardizem 120 mg daily, Lopressor 50 mg twice a day, Imdur 30 mg daily.  3]hypertension her blood pressure remains normal to soft on Cardizem Lopressor Imdur and Lasix.  4]chronic atrial fibrillation on Cardizem and Coumadin and Lopressor. She is rate controlled and INR therapeutic.  5]CKD stage III baseline creatinine 1.5-1.8. Creatinine today is 1.37. monitor closely  on increased dose of Lasix.  6]hypothyroidism continue Synthroid recent TSH normal     DVT prophylaxis: Coumadin code Status: DNR Family Communication:  Discussed with Liborio Nixon her niece and POA Disposition Plan: Plan to discharge her tomorrow back to skilled nursing facility Consultants:  Cardiology consult per family request  Procedures: None Antimicrobials: Cefepime which will be stopped today and start Augmentin patient was sleeping when I walked into the room  Subjective: Without any oxygen on room air saturation 98% appeared comfortable in no acute I was able to wake her up she was able to answer my questions appropriately she felt her breathing was better.  Her cough seems to be better 2.  I have not heard her cough during the time I was in the room.  I also talked to the nurse who was charting in the room if she heard her cough and the staff reported no.  Objective: Vitals:   10/21/17 0100 10/21/17 0500 10/21/17 0751 10/21/17 0837  BP: (!) 147/66   (!) 134/99  Pulse: 74   (!) 144  Resp: 16     Temp: 97.8 F (36.6 C)   (!) 97.3 F (36.3 C)  TempSrc: Oral     SpO2: 98%  95% 96%  Weight:  59 kg (130 lb 1.1 oz)    Height:        Intake/Output Summary (Last 24 hours) at 10/21/2017 1047 Last data filed at 10/21/2017 0400 Gross per 24 hour  Intake 500 ml  Output 500 ml  Net 0 ml   Filed Weights   10/19/17 0500 10/20/17 0500 10/21/17 0500  Weight: 57.7 kg (127 lb 3.3 oz) 56.5 kg (124 lb 9 oz) 59 kg (130 lb 1.1 oz)    Examination:  General exam: Appears calm and comfortable  Respiratory system: Decreased breath sounds at the bases.  To auscultation. Respiratory effort normal. Cardiovascular system: S1 & S2 heard, RRR. No JVD, murmurs, rubs, gallops or clicks. No pedal edema. Gastrointestinal system: Abdomen is nondistended, soft and nontender. No organomegaly or masses felt. Normal bowel sounds heard. Central nervous system: Alert and oriented. No focal neurological deficits. Extremities: Symmetric 5 x 5 power. Skin: No rashes, lesions or ulcers Psychiatry: Judgement and insight appear normal. Mood & affect appropriate.      Data Reviewed: I have personally reviewed following labs and imaging studies  CBC: Recent Labs  Lab 10/15/17 0951  10/17/17 0315 10/18/17 0223 10/19/17 0314 10/20/17 0508 10/21/17 0343  WBC 23.7*   < > 20.7* 18.2* 20.1* 17.1* 19.6*  NEUTROABS 20.7*  --   --   --   --   --   --   HGB 12.5   < > 11.8* 11.9* 13.0 11.5* 11.5*  HCT 41.2   < > 38.4 38.1 42.4 37.6 37.4  MCV 89.4   < > 88.5 87.2 88.5 88.5 87.6  PLT 231   < > 211 207 165 178 193   < > = values in this interval not displayed.   Basic Metabolic Panel: Recent Labs  Lab 10/15/17 1352  10/17/17 0315 10/18/17 0223 10/19/17 0314 10/20/17 0508 10/21/17 0343  NA  --    < > 140 141 139 140 140  K  --    < > 4.4 3.8 4.5 3.5 3.8  CL  --    < > 96* 98 99 99 99  CO2  --    < > 33* 33* 29 31 31   GLUCOSE  --    < >  147* 101* 145* 109* 124*  BUN  --    < > 57* 52* 47* 38* 40*  CREATININE  --    < > 1.57* 1.33* 1.61* 1.37* 1.49*  CALCIUM  --    < > 9.4 9.4 9.4 9.1 9.2  MG 1.9  --   --   --   --   --  2.1   < > = values in this interval not displayed.   GFR: Estimated Creatinine Clearance: 19.1 mL/min (A) (by C-G formula based on SCr of 1.49 mg/dL (H)). Liver Function Tests: Recent Labs  Lab 10/15/17 0951  AST 16  ALT 31  ALKPHOS 72  BILITOT 1.6*  PROT 5.7*  ALBUMIN 3.0*   No results for input(s): LIPASE, AMYLASE in the last 168 hours. No results for input(s): AMMONIA in the last 168 hours. Coagulation Profile: Recent Labs  Lab 10/17/17 0315 10/18/17 0223 10/19/17 0314 10/20/17 0508 10/21/17 0343  INR 3.00 3.80 2.59 2.13 2.22   Cardiac Enzymes: No results for input(s): CKTOTAL, CKMB, CKMBINDEX, TROPONINI in the last 168 hours. BNP (last 3 results) No results for input(s): PROBNP in the last 8760 hours. HbA1C: No results for input(s): HGBA1C in the last 72 hours. CBG: No results for input(s): GLUCAP in the last 168 hours. Lipid Profile: No results for input(s): CHOL, HDL, LDLCALC, TRIG, CHOLHDL,  LDLDIRECT in the last 72 hours. Thyroid Function Tests: No results for input(s): TSH, T4TOTAL, FREET4, T3FREE, THYROIDAB in the last 72 hours. Anemia Panel: No results for input(s): VITAMINB12, FOLATE, FERRITIN, TIBC, IRON, RETICCTPCT in the last 72 hours. Sepsis Labs: Recent Labs  Lab 10/15/17 1002 10/15/17 1316  LATICACIDVEN 1.01 1.13    Recent Results (from the past 240 hour(s))  Culture, blood (Routine x 2)     Status: None   Collection Time: 10/15/17 10:05 AM  Result Value Ref Range Status   Specimen Description BLOOD RIGHT FOREARM  Final   Special Requests   Final    BOTTLES DRAWN AEROBIC AND ANAEROBIC Blood Culture results may not be optimal due to an inadequate volume of blood received in culture bottles   Culture   Final    NO GROWTH 5 DAYS Performed at [facility], [address] [place], [place] [address]    Report Status 10/20/2017 FINAL  Final  Culture, blood (Routine x 2)     Status: None   Collection Time: 10/15/17 10:27 AM  Result Value Ref Range Status   Specimen Description BLOOD RIGHT HAND  Final   Special Requests   Final    BOTTLES DRAWN AEROBIC ONLY Blood Culture results may not be optimal due to an inadequate volume of blood received in culture bottles   Culture   Final    NO GROWTH 5 DAYS Performed at [facility], [address], [place], [place] [address]    Report Status 10/20/2017 FINAL  Final         Radiology Studies: Dg Chest 1 View  Result Date: 10/20/2017 CLINICAL DATA:  84 year old female with a history of hypoxia and weakness EXAM: CHEST  1 VIEW COMPARISON:  Multiple prior, most recent 10/19/2017, 10/17/2017, 10/15/2017 FINDINGS: Cardiomediastinal silhouette unchanged in size and contour. Cardiomegaly. Improved aeration at the lung bases with blunting of the bilateral costophrenic angles. Coarsened interstitial markings. No new confluent airspace disease. Retained enteric contrast within the left colon. Osteopenia with no  acute displaced fracture IMPRESSION: Improving aeration at the lung bases with decreased size of  the pleural effusions. Similar appearance of chronic lung changes and cardiomegaly Electronically Signed   By: Gilmer Mor D.O.   On: 10/20/2017 07:34   [place] Chest (pleural Effusion)  Result Date: 10/19/2017 CLINICAL DATA:  Bilateral pleural effusions. EXAM: CHEST ULTRASOUND COMPARISON:  Chest x-ray earlier today. FINDINGS: Ultrasound demonstrates relatively small bilateral pleural effusions and consolidation of both lower lobes. There was not enough fluid present to warrant thoracentesis today. IMPRESSION: Bilateral small pleural effusions. Thoracentesis was not performed due to small fluid volume. Electronically Signed   By: Irish Lack M.D.   On: 10/19/2017 14:00        Scheduled Meds: . allopurinol  100 mg Oral Daily  . atorvastatin  10 mg Oral Daily  . brimonidine  1 drop Both Eyes TID  . cholecalciferol  400 Units Oral Daily  . diltiazem  120 mg Oral Daily  . furosemide  40 mg Intravenous BID  . guaiFENesin  600 mg Oral BID  . ipratropium-albuterol  3 mL Nebulization TID  . isosorbide mononitrate  30 mg Oral Daily  . latanoprost  1 drop Both Eyes QHS  . levothyroxine  88 mcg Oral QAC breakfast  . metoprolol succinate  12.5 mg Oral Daily  . potassium chloride SA  10 mEq Oral Daily  . timolol  1 drop Both Eyes Daily  . warfarin  2 mg Oral q1800  . Warfarin - Pharmacist Dosing Inpatient   Does not apply q1800   Continuous Infusions: . ceFEPime (MAXIPIME) IV 1 g (10/21/17 0940)     LOS: 6 days     Alwyn Ren, MD [facility] If 7PM-7AM, please contact night-coverage [email or web address] Password [facility] 10/21/2017, 10:47 AM

## 2017-10-22 LAB — PROTIME-INR
INR: 2.37
PROTHROMBIN TIME: 25.7 s — AB (ref 11.4–15.2)

## 2017-10-22 LAB — BASIC METABOLIC PANEL
ANION GAP: 12 (ref 5–15)
BUN: 43 mg/dL — ABNORMAL HIGH (ref 8–23)
CHLORIDE: 97 mmol/L — AB (ref 98–111)
CO2: 31 mmol/L (ref 22–32)
CREATININE: 1.46 mg/dL — AB (ref 0.44–1.00)
Calcium: 9.6 mg/dL (ref 8.9–10.3)
GFR calc non Af Amer: 29 mL/min — ABNORMAL LOW (ref >60)
GFR, EST AFRICAN AMERICAN: 34 mL/min — AB (ref >60)
Glucose, Bld: 106 mg/dL — ABNORMAL HIGH (ref 70–99)
POTASSIUM: 3.7 mmol/L (ref 3.5–5.1)
SODIUM: 140 mmol/L (ref 135–145)

## 2017-10-22 LAB — CBC
HEMATOCRIT: 39.2 % (ref 36.0–46.0)
HEMOGLOBIN: 12 g/dL (ref 12.0–15.0)
MCH: 27 pg (ref 26.0–34.0)
MCHC: 30.6 g/dL (ref 30.0–36.0)
MCV: 88.1 fL (ref 78.0–100.0)
Platelets: 189 10*3/uL (ref 150–400)
RBC: 4.45 MIL/uL (ref 3.87–5.11)
RDW: 18.1 % — ABNORMAL HIGH (ref 11.5–15.5)
WBC: 18.3 10*3/uL — ABNORMAL HIGH (ref 4.0–10.5)

## 2017-10-22 MED ORDER — CEFDINIR 300 MG PO CAPS: 300.0000 mg | ORAL_CAPSULE | Freq: Every day | ORAL | 0 refills | Status: DC

## 2017-10-22 MED ORDER — FUROSEMIDE 40 MG PO TABS: 40.0000 mg | ORAL_TABLET | Freq: Two times a day (BID) | ORAL | 0 refills | Status: DC

## 2017-10-22 MED ORDER — METOPROLOL SUCCINATE ER 25 MG PO TB24: 12.5000 mg | ORAL_TABLET | Freq: Every day | ORAL | 0 refills | Status: DC

## 2017-10-22 NOTE — Progress Notes (Signed)
Patient will discharge to [facility] Anticipated discharge date: 7/31 Family notified: [employer] [facility] [employer] by Sharin MonsPTAR- scheduled at 10:30am Report #: [phone] Rm 104B  CSW signing off.  Burna SisJenna H. Jensen [place], LCSW Clinical Social Worker [phone]

## 2017-10-22 NOTE — Progress Notes (Signed)
Spoke with Harvin HazelKelli, RN for report.

## 2017-10-22 NOTE — Discharge Summary (Signed)
Physician Discharge Summary  FRIMY UFFELMAN WUJ:[id number] DOB: 12/13/1919 DOA: 10/15/2017  PCP: Burton Apley, MD  Admit date: 10/15/2017 Discharge date: 10/22/2017  Admitted From: Skilled nursing facility Disposition: Skilled nursing facility [facility] Recommendations for Outpatient Follow-up:  1. Follow up with PCP in 1-2 weeks 2. Please obtain BMP/CBC in one week to follow-up on the renal functions since she is on diuretics. 3. Follow-up with cardiologist in 2 weeks  Home Health none Equipment/Devices none  Discharge Condition stable CODE STATUS DO NOT RESUSCITATE Diet recommendation: Cardiac Brief/Interim Summary:84 y.o.femalewith medical history significantfor chronic systolic heart failure, CAD, A. Fib, CVA, chronic kidney disease, hypothyroidism presents to the emergency Department chief complaint persistent worsening cough   Patient was recently hospitalized for rhinovirus bronchitis. During that hospitalization she was evaluated for weakness and confusion with a brain MRI that showed a subacute stroke. She passed her swallow eval and was eventually discharged on July 17 with Robitussin and Xopenex nebs Dulera and a prednisone taper. She she states that she never felt any better and continued to cough even at the time of discharge. Over the last day or so she said her cough is gotten worse. She reports a constant wet sounding nonproductive cough. Last night and this morning she felt quite short of breath. She remembers the staff at the facility "shaking me last night". No reports of any fever chills diarrhea nausea vomiting. No lower extremity edema. This morning the staff at the facility found her quite lethargic short of breath and hypoxic. Of note patient was discharged July 17 without oxygen as she "didn't need it". EMS was called  ED Course:in the emergency department she's afebrile and normotensive tachycardia with tachypnea and an oxygen saturation level of 88% on room  air.she was provided with supplemental oxygen and her oxygen saturation level improved to greater than 90%. Provided with gentle IV fluids and broad-spectrum antibiotics for healthcare associated pneumonia    Discharge Diagnoses:  Principal Problem:   Acute on chronic respiratory failure with hypoxia (HCC) Active Problems:   Atrial fibrillation (HCC)   Pleural effusion, bacterial   HCAP (healthcare-associated pneumonia)   Chronic systolic CHF (congestive heart failure) (HCC), EF 35%   Hypokalemia   Hypothyroidism   HTN (hypertension)   CKD (chronic kidney disease) stage 4, GFR 15-29 ml/min (HCC)   Leukocytosis   Sepsis (HCC)   Hypernatremia   Congestive heart failure (HCC) 1]acute on chronic hypoxic respiratory failure secondary to healthcare associated pneumonia recent viral bronchitis and acute on chronic systolic heart failure with ejection fraction 35%-patient was treated with vancomycin and cefepime.  She is being discharged on cefdinir for 3 more days to finish the course.  On the day of discharge she was on room air saturating above 98%.  Patient had bilateral moderate pleural effusion which was cleared with IV Lasix.  She was seen in consultation by interventional radiology per family request.  She did NOT  have a thoracentesis done as there was no fluid to be removed.  Patient was seen by speech therapy.  They felt she is at very mild aspiration risk.  Patient continued to have leukocytosis throughout the hospital stay.  Afebrile on discharge.  It looks like she tends to have chronic leukocytosis.  Her cough improved significantly.  I have seen her sleeping without a cough for a long period of time .  Chest x-ray done one day prior to discharge shows no evidence of infiltrates or effusion.  Patient would benefit from using incentive spirometry  at the nursing home.  2]acute on chronic systolic CHF with low EF 35% patient remains negative over3.7L.   Continue p.o. Lasix 40 mg twice  a day.  Continue Lopressor, Cardizem, Imdur.  3]hypertension her blood pressure remains normal to soft on Cardizem Lopressor Imdur and Lasix.  4]chronic atrial fibrillation on Cardizem and Coumadin and Lopressor. She is rate controlled and INR therapeutic.  5]CKD stage III baseline creatinine 1.5-1.8. Creatinine today is 1.46.monitor closely on increased dose of Lasix.  6] hypothyroidism continue Synthroid.    Discharge Instructions  Discharge Instructions    Call MD for:  difficulty breathing, headache or visual disturbances   Complete by:  As directed    Call MD for:  persistant dizziness or light-headedness   Complete by:  As directed    Call MD for:  persistant nausea and vomiting   Complete by:  As directed    Call MD for:  severe uncontrolled pain   Complete by:  As directed    Call MD for:  temperature >100.4   Complete by:  As directed    Diet - low sodium heart healthy   Complete by:  As directed    Increase activity slowly   Complete by:  As directed      Allergies as of 10/22/2017      Reactions   Penicillins Hives, Swelling   Has patient had a PCN reaction causing immediate rash, facial/tongue/throat swelling, SOB or lightheadedness with hypotension: Yes Has patient had a PCN reaction causing severe rash involving mucus membranes or skin necrosis: Yes Has patient had a PCN reaction that required hospitalization No Has patient had a PCN reaction occurring within the last 10 years: No If all of the above answers are "NO", then may proceed with Cephalosporin use.   Sulfa Antibiotics Anaphylaxis, Swelling   Fish Allergy Nausea And Vomiting      Medication List    STOP taking these medications   acetaminophen 500 MG tablet Commonly known as:  TYLENOL   allopurinol 300 MG tablet Commonly known as:  ZYLOPRIM   doxycycline 100 MG tablet Commonly known as:  VIBRA-TABS   metolazone 2.5 MG tablet Commonly known as:  ZAROXOLYN   metoprolol tartrate 50  MG tablet Commonly known as:  LOPRESSOR   OVER THE COUNTER MEDICATION   predniSONE 10 MG tablet Commonly known as:  DELTASONE   spironolactone 25 MG tablet Commonly known as:  ALDACTONE     TAKE these medications   atorvastatin 10 MG tablet Commonly known as:  LIPITOR Take 10 mg by mouth daily.   brimonidine 0.15 % ophthalmic solution Commonly known as:  ALPHAGAN Place 1 drop into both eyes 3 (three) times daily.   cefdinir 300 MG capsule Commonly known as:  OMNICEF Take 1 capsule (300 mg total) by mouth daily.   cholecalciferol 400 units Tabs tablet Commonly known as:  VITAMIN D Take 400 Units by mouth daily.   diltiazem 120 MG 24 hr capsule Commonly known as:  CARDIZEM CD Take 1 capsule (120 mg total) by mouth daily.   furosemide 40 MG tablet Commonly known as:  LASIX Take 1 tablet (40 mg total) by mouth 2 (two) times daily. What changed:    medication strength  how much to take  when to take this   guaiFENesin-dextromethorphan 100-10 MG/5ML syrup Commonly known as:  ROBITUSSIN DM Take 5 mLs by mouth every 4 (four) hours as needed for cough.   isosorbide mononitrate 30 MG 24 hr tablet  Commonly known as:  IMDUR Take 1 tablet (30 mg total) by mouth daily.   latanoprost 0.005 % ophthalmic solution Commonly known as:  XALATAN Place 1 drop into both eyes at bedtime.   levalbuterol 0.63 MG/3ML nebulizer solution Commonly known as:  XOPENEX Take 3 mLs (0.63 mg total) by nebulization 3 (three) times daily for 20 days. And q4hours as needed for SOB/wheezing What changed:    when to take this  additional instructions   levothyroxine 88 MCG tablet Commonly known as:  SYNTHROID, LEVOTHROID Take 88 mcg by mouth daily before breakfast.   metoprolol succinate 25 MG 24 hr tablet Commonly known as:  TOPROL-XL Take 0.5 tablets (12.5 mg total) by mouth daily.   mometasone-formoterol 100-5 MCG/ACT Aero Commonly known as:  DULERA Inhale 2 puffs into the lungs 2  (two) times daily.   ondansetron 4 MG tablet Commonly known as:  ZOFRAN Take 4 mg by mouth every 4 (four) hours as needed for nausea or vomiting.   OXYGEN Inhale 2 L into the lungs See admin instructions. To keep stats greater than 90% check sats every shift   potassium chloride SA 20 MEQ tablet Commonly known as:  K-DUR,KLOR-CON Take 0.5 tablets (10 mEq total) by mouth daily.   SYSTANE OP Apply 2 drops to eye daily as needed (dry eyes).   timolol 0.5 % ophthalmic solution Commonly known as:  TIMOPTIC Place 1 drop into both eyes daily.   traMADol 50 MG tablet Commonly known as:  ULTRAM Take 50 mg by mouth every 8 (eight) hours as needed for moderate pain.   Vitamin B Complex Tabs Take 1 tablet by mouth daily with lunch.   warfarin 2 MG tablet Commonly known as:  COUMADIN Take as directed. If you are unsure how to take this medication, talk to your nurse or doctor. Original instructions:  Take 2 mg by mouth at bedtime. What changed:  Another medication with the same name was removed. Continue taking this medication, and follow the directions you see here.       Allergies  Allergen Reactions  . Penicillins Hives and Swelling    Has patient had a PCN reaction causing immediate rash, facial/tongue/throat swelling, SOB or lightheadedness with hypotension: Yes Has patient had a PCN reaction causing severe rash involving mucus membranes or skin necrosis: Yes Has patient had a PCN reaction that required hospitalization No Has patient had a PCN reaction occurring within the last 10 years: No If all of the above answers are "NO", then may proceed with Cephalosporin use.    . Sulfa Antibiotics Anaphylaxis and Swelling  . Fish Allergy Nausea And Vomiting    Consultations:     Procedures/Studies: Dg Chest 1 View  Result Date: 10/21/2017 CLINICAL DATA:  Dry cough for 1 week EXAM: CHEST  1 VIEW COMPARISON:  10/20/2017 FINDINGS: Cardiac shadow remains enlarged. Aortic  calcifications are again seen. Mild left basilar atelectasis is noted. No sizable effusions are seen. No bony abnormality noted. IMPRESSION: Left basilar atelectasis. Electronically Signed   By: Alcide Clever M.D.   On: 10/21/2017 14:46   Dg Chest 1 View  Result Date: 10/20/2017 CLINICAL DATA:  84 year old female with a history of hypoxia and weakness EXAM: CHEST  1 VIEW COMPARISON:  Multiple prior, most recent 10/19/2017, 10/17/2017, 10/15/2017 FINDINGS: Cardiomediastinal silhouette unchanged in size and contour. Cardiomegaly. Improved aeration at the lung bases with blunting of the bilateral costophrenic angles. Coarsened interstitial markings. No new confluent airspace disease. Retained enteric contrast within the  left colon. Osteopenia with no acute displaced fracture IMPRESSION: Improving aeration at the lung bases with decreased size of the pleural effusions. Similar appearance of chronic lung changes and cardiomegaly Electronically Signed   By: Gilmer Mor D.O.   On: 10/20/2017 07:34   Dg Chest 1 View  Result Date: 10/17/2017 CLINICAL DATA:  84 year old female with cough for several days. EXAM: CHEST  1 VIEW COMPARISON:  Chest radiograph 10/15/2017 and earlier. FINDINGS: Portable AP semi upright view at 1209 hours. Persistent small bilateral pleural effusions. Stable pulmonary vascularity, no acute edema. No pneumothorax. Lung base atelectasis, but no consolidation identified. Stable cardiomegaly and mediastinal contours. Calcified aortic atherosclerosis. Chronic lower thoracic augmented compression fracture. Negative visible bowel gas pattern. IMPRESSION: 1. Small pleural effusions with atelectasis are stable since 10/15/2017. 2. No new cardiopulmonary abnormality. 3. Chronic cardiomegaly, Aortic Atherosclerosis (ICD10-I70.0). Electronically Signed   By: Odessa Fleming M.D.   On: 10/17/2017 12:34   Dg Chest 2 View  Result Date: 10/19/2017 CLINICAL DATA:  Hypoxia. EXAM: CHEST - 2 VIEW COMPARISON:   One-view chest x-ray 10/17/2017 FINDINGS: Heart is enlarged. Interstitial edema has increased slightly. Bilateral pleural effusions are present. Bibasilar airspace disease likely reflects atelectasis. Spinal augmentation is noted in the midthoracic spine. Focal kyphosis is present at the same level. IMPRESSION: 1. Cardiomegaly with mild edema and bilateral effusions. 2. Bibasilar airspace disease likely reflects atelectasis. Infection is considered less likely. Electronically Signed   By: Marin Roberts M.D.   On: 10/19/2017 08:05   Dg Chest 2 View  Result Date: 10/15/2017 CLINICAL DATA:  2-3 week history of productive cough. Chest pain related to coughing. Current history of hypertension and CHF. EXAM: CHEST - 2 VIEW COMPARISON:  10/04/2017, 09/29/2017 and earlier. FINDINGS: Cardiac silhouette markedly enlarged, unchanged. Thoracic aorta atherosclerotic, unchanged. Hilar and mediastinal contours otherwise unremarkable. Stable moderate-sized chronic BILATERAL pleural effusions. Worsening aeration in the lower lobes since the examination 11 days ago. Prominent bronchovascular markings diffusely and moderate central peribronchial thickening, unchanged. Pulmonary vascularity normal. Exaggeration of the usual thoracic kyphosis. Prior augmentation of a mid thoracic vertebral body. IMPRESSION: 1. Stable moderate-sized chronic BILATERAL pleural effusions. 2. Worsening aeration in the lower lobes (LEFT greater than RIGHT). This likely reflects superimposed passive atelectasis, although pneumonia is not excluded given the productive cough. 3. Stable cardiomegaly without evidence of pulmonary edema. 4. Stable moderate changes of chronic bronchitis and/or asthma. Electronically Signed   By: Hulan Saas M.D.   On: 10/15/2017 10:53   Dg Chest 2 View  Result Date: 10/04/2017 CLINICAL DATA:  Generalized weakness, fatigue and shortness of breath increasing over the last week. History of atrial fibrillation. EXAM:  CHEST - 2 VIEW COMPARISON:  09/29/2017 and 09/17/2017. FINDINGS: There is stable cardiomegaly and aortic atherosclerosis. There is chronic central airway thickening, left basilar atelectasis and small bilateral pleural effusions. No edema or confluent airspace opacity. Spinal augmentation changes noted at T7 and L1, without evidence of acute osseous abnormality. IMPRESSION: Overall similar radiographic appearance of the chest with cardiomegaly, chronic pleural effusions and left basilar atelectasis. No definite acute findings. Electronically Signed   By: Carey Bullocks M.D.   On: 10/04/2017 20:14   Dg Chest 2 View  Result Date: 09/29/2017 CLINICAL DATA:  Recent kyphoplasty, awoke with weakness today, history CHF, hypertension, atrial fibrillation EXAM: CHEST - 2 VIEW COMPARISON:  09/17/2017 FINDINGS: Enlargement of cardiac silhouette with pulmonary vascular congestion. Mediastinal contours normal. Atherosclerotic calcification aorta. Bronchitic changes with bibasilar atelectasis greater on LEFT. Upper lungs clear with  underlying emphysematous changes noted. Small bibasilar effusions. No pneumothorax. Bones demineralized with prior spinal augmentation procedures at 2 levels. IMPRESSION: Emphysematous and bronchitic changes question COPD. Bibasilar atelectasis LEFT greater than RIGHT with bibasilar pleural effusions. Electronically Signed   By: Ulyses Southward M.D.   On: 09/29/2017 19:16   Mr Maxine Glenn Head Wo Contrast  Result Date: 10/06/2017 CLINICAL DATA:  Follow-up LEFT frontal lobe infarct. EXAM: MRA HEAD WITHOUT CONTRAST TECHNIQUE: Angiographic images of the Circle of Willis were obtained using MRA technique without intravenous contrast. COMPARISON:  MRI of the head October 06, 2017 at 0237 hours. FINDINGS: ANTERIOR CIRCULATION: Normal flow related enhancement of the included cervical, petrous, cavernous and supraclinoid internal carotid arteries. Patent anterior communicating artery. Patent anterior and middle  cerebral arteries, mild luminal irregularity and most compatible with atherosclerosis. No large vessel occlusion, flow limiting stenosis, aneurysm. POSTERIOR CIRCULATION: LEFT vertebral artery is dominant. Vertebrobasilar arteries are patent, with normal flow related enhancement of the main branch vessels. Patent posterior cerebral arteries. No large vessel occlusion, flow limiting stenosis,  aneurysm. ANATOMIC VARIANTS: None. Source images and MIP images were reviewed. IMPRESSION: 1. No emergent large vessel occlusion or flow-limiting stenosis. Electronically Signed   By: Awilda Metro M.D.   On: 10/06/2017 16:29   Mr Brain Wo Contrast  Result Date: 10/06/2017 CLINICAL DATA:  Encephalopathy EXAM: MRI HEAD WITHOUT CONTRAST TECHNIQUE: Multiplanar, multiecho pulse sequences of the brain and surrounding structures were obtained without intravenous contrast. COMPARISON:  Head CT 07/10/2017 FINDINGS: BRAIN: There is a single subcentimeter focus of hyperintensity on diffusion-weighted imaging within the left frontal white matter. No corresponding ADC abnormality. No acute hemorrhage. The midline structures are normal. There are no old infarcts. The white matter signal is normal for the patient's age. Generalized atrophy, but not greater than expected for age. Susceptibility-sensitive sequences show no chronic microhemorrhage or superficial siderosis. VASCULAR: Major intracranial arterial and venous sinus flow voids are preserved. SKULL AND UPPER CERVICAL SPINE: The visualized skull base, calvarium, upper cervical spine and extracranial soft tissues are normal. SINUSES/ORBITS: No fluid levels or advanced mucosal thickening. No mastoid or middle ear effusion. The orbits are normal. IMPRESSION: 1. Single focus of hyperintensity on diffusion-weighted imaging within the left frontal white matter is likely a early subacute infarct. No associated hemorrhage or mass effect. 2. Otherwise normal MRI of the aging brain.  Electronically Signed   By: Deatra Robinson M.D.   On: 10/06/2017 03:57   [place] Chest (pleural Effusion)  Result Date: 10/19/2017 CLINICAL DATA:  Bilateral pleural effusions. EXAM: CHEST ULTRASOUND COMPARISON:  Chest x-ray earlier today. FINDINGS: Ultrasound demonstrates relatively small bilateral pleural effusions and consolidation of both lower lobes. There was not enough fluid present to warrant thoracentesis today. IMPRESSION: Bilateral small pleural effusions. Thoracentesis was not performed due to small fluid volume. Electronically Signed   By: Irish Lack M.D.   On: 10/19/2017 14:00   Dg Swallowing Func-speech Pathology  Result Date: 10/17/2017 Objective Swallowing Evaluation: Type of Study: MBS-Modified Barium Swallow Study  Patient Details Name: BRANDELYN HENNE MRN: [id number] Date of Birth: 02-01-1920 Today's Date: 10/17/2017 Time: SLP Start Time (ACUTE ONLY): 1447 -SLP Stop Time (ACUTE ONLY): 1505 SLP Time Calculation (min) (ACUTE ONLY): 18 min Past Medical History: Past Medical History: Diagnosis Date . Chronic a-fib (HCC)  . Chronic back pain  . Chronic systolic (congestive) heart failure (HCC)   reduced EF since 2018 to 35-40% . CKD (chronic kidney disease) stage 4, GFR 15-29 ml/min (HCC) 09/11/2015 . Hyperlipidemia  .  Hypertension  . Hypothyroidism  . Pulmonary hypertension (HCC)   PASP 65 mmHg at Echo 3/17 . Thyroid disease  Past Surgical History: Past Surgical History: Procedure Laterality Date . APPENDECTOMY   . cataracts    bilateral . EYE SURGERY    left eye "hole" . IR KYPHO THORACIC WITH BONE BIOPSY  09/26/2017 . IR RADIOLOGIST EVAL & MGMT  09/18/2017 . TONSILLECTOMY   HPI: Pt is a 84 y.o. female admitted from [facility] on 10/15/17 with worsening cough and SOB; found to have respiratory failure and sepsis, likely secondary to pneumonia. CXR with worsening aeration in the lower lobes (LEFT greater than RIGHT), likely atelectasis but cannot exclude PNA. PMH includes kyphoplasty (09/26/17), CHF,  CAD, chronic a-fib, CVA, CKD IV. Patient seen by SLP services for swallow evaluation in 2017 in which she demonstrated normal oropharyngeal swallowing function. BSE this admission 7/25 showed what appeared to be functional oropharyngeal swallow although with delayed coughing that could be suggestive of esophageal component. MD ordered MBS to better assess.  Subjective: pt alert, pleasant, says she swallows well but sometimes has some trouble with her dentures Assessment / Plan / Recommendation CHL IP CLINICAL IMPRESSIONS 10/17/2017 Clinical Impression Pt's oropharyngeal swallow is within gross functional limits for her age. She had one episode of trace penetration that reached her vocal folds, which elicited a strong, reflexive cough response to clear. No other airway compromise was noted. She had trace residue with thin liquids that cleared with additional swallows. During brief esophageal scan, pt appeared to have a column of barium remaining after only a few solid trials. With additional thin liquid boluses it appeared much clearer. MD not present to confirm findings; may wish to consider further esophageal w/u if indicated. Would continue with regular diet and thin liquids using aspiration and esophageal precautions - particularly alternating between solids/liquids. Will f/u briefly for additional education, training, and monitoring for tolerance. SLP Visit Diagnosis Dysphagia, unspecified (R13.10) Attention and concentration deficit following -- Frontal lobe and executive function deficit following -- Impact on safety and function Mild aspiration risk   CHL IP TREATMENT RECOMMENDATION 10/17/2017 Treatment Recommendations Therapy as outlined in treatment plan below   No flowsheet data found. CHL IP DIET RECOMMENDATION 10/17/2017 SLP Diet Recommendations Regular solids;Thin liquid Liquid Administration via Cup;Straw Medication Administration Crushed with puree Compensations Slow rate;Small sips/bites;Follow solids  with liquid Postural Changes Remain semi-upright after after feeds/meals (Comment);Seated upright at 90 degrees   CHL IP OTHER RECOMMENDATIONS 10/17/2017 Recommended Consults -- Oral Care Recommendations Oral care BID Other Recommendations --   CHL IP FOLLOW UP RECOMMENDATIONS 10/17/2017 Follow up Recommendations None   CHL IP FREQUENCY AND DURATION 10/17/2017 Speech Therapy Frequency (ACUTE ONLY) min 1 x/week Treatment Duration 1 week      CHL IP ORAL PHASE 10/17/2017 Oral Phase WFL Oral - Pudding Teaspoon -- Oral - Pudding Cup -- Oral - Honey Teaspoon -- Oral - Honey Cup -- Oral - Nectar Teaspoon -- Oral - Nectar Cup -- Oral - Nectar Straw -- Oral - Thin Teaspoon -- Oral - Thin Cup -- Oral - Thin Straw -- Oral - Puree -- Oral - Mech Soft -- Oral - Regular -- Oral - Multi-Consistency -- Oral - Pill -- Oral Phase - Comment --  CHL IP PHARYNGEAL PHASE 10/17/2017 Pharyngeal Phase Impaired Pharyngeal- Pudding Teaspoon -- Pharyngeal -- Pharyngeal- Pudding Cup -- Pharyngeal -- Pharyngeal- Honey Teaspoon -- Pharyngeal -- Pharyngeal- Honey Cup -- Pharyngeal -- Pharyngeal- Nectar Teaspoon -- Pharyngeal -- Pharyngeal- Nectar Cup --  Pharyngeal -- Pharyngeal- Nectar Straw -- Pharyngeal -- Pharyngeal- Thin Teaspoon -- Pharyngeal -- Pharyngeal- Thin Cup -- Pharyngeal -- Pharyngeal- Thin Straw Penetration/Aspiration before swallow Pharyngeal Material enters airway, CONTACTS cords and then ejected out Pharyngeal- Puree -- Pharyngeal -- Pharyngeal- Mechanical Soft -- Pharyngeal -- Pharyngeal- Regular -- Pharyngeal -- Pharyngeal- Multi-consistency -- Pharyngeal -- Pharyngeal- Pill -- Pharyngeal -- Pharyngeal Comment --  CHL IP CERVICAL ESOPHAGEAL PHASE 10/17/2017 Cervical Esophageal Phase WFL Pudding Teaspoon -- Pudding Cup -- Honey Teaspoon -- Honey Cup -- Nectar Teaspoon -- Nectar Cup -- Nectar Straw -- Thin Teaspoon -- Thin Cup -- Thin Straw -- Puree -- Mechanical Soft -- Regular -- Multi-consistency -- Pill -- Cervical Esophageal  Comment -- Maxcine Ham 10/17/2017, 3:38 PM  Maxcine Ham, M.A. CCC-SLP [phone]             Ir Kypho Thoracic With Bone Biopsy  Result Date: 09/26/2017 INDICATION: Painful T7 compression fracture EXAM: T7 KYPHOPLASTY COMPARISON:  09/18/2017 MR thoracic MEDICATIONS: As antibiotic prophylaxis, vancomycin 1 g was ordered pre-procedure and administered intravenously within 1 hour of incision. ANESTHESIA/SEDATION: Moderate (conscious) sedation was employed during this procedure. A total of Versed 1 mg and Fentanyl 25 mcg was administered intravenously. Moderate Sedation Time: 35 minutes. The patient's level of consciousness and vital signs were monitored continuously by radiology nursing throughout the procedure under my direct supervision. FLUOROSCOPY TIME:  Fluoroscopy Time: 5 minutes 48 seconds (34 mGy) COMPLICATIONS: None immediate. PROCEDURE: Following a full explanation of the procedure along with the potential associated complications, an informed witnessed consent was obtained. The back was prepped and draped in a sterile fashion. 1% lidocaine was utilized for local anesthesia. Under fluoroscopic guidance, a 22 gauge needle was advanced to the left T7 pedicle. 1% lidocaine was infiltrated. Under fluoroscopic guidance, a 10 gauge expressed needle was advanced into the T7 vertebral body via left pedicle approach. The drill was inserted followed by the 15 2 balloon. This was insufflated to 140 pounds per square inch. The balloon was removed and a cement deposition trocar was inserted. Cement was then deposited. The cement trocar was removed. The outer cannula was removed. Final imaging was performed. FINDINGS: Imaging demonstrates access into the T7 vertebral body via left trans pedicle approach. Subsequent imaging documents T7 kyphoplasty via left pedicle. Cement fills the vertebral body and crossing the midline while extending from the inferior to superior endplate. There is no extraosseous  cement. IMPRESSION: Successful T7 kyphoplasty Electronically Signed   By: Jolaine Click M.D.   On: 09/26/2017 12:58    (Echo, Carotid, EGD, Colonoscopy, ERCP)    Subjective:   Discharge Exam: Vitals:   10/22/17 0021 10/22/17 0745  BP: (!) 141/73 129/84  Pulse: 80 (!) 107  Resp: 20 17  Temp: 97.7 F (36.5 C)   SpO2: 96% 97%   Vitals:   10/21/17 2045 10/22/17 0021 10/22/17 0500 10/22/17 0745  BP:  (!) 141/73  129/84  Pulse: (!) 102 80  (!) 107  Resp: 14 20  17   Temp:  97.7 F (36.5 C)    TempSrc:  Oral    SpO2: 98% 96%  97%  Weight:   58.1 kg (128 lb 1.4 oz)   Height:        General: Pt is alert, awake, not in acute distress Cardiovascular: RRR, S1/S2 +, no rubs, no gallops Respiratory: CTA bilaterally, no wheezing, no rhonchi Abdominal: Soft, NT, ND, bowel sounds + Extremities: no edema, no cyanosis    The results of significant diagnostics  from this hospitalization (including imaging, microbiology, ancillary and laboratory) are listed below for reference.     Microbiology: Recent Results (from the past 240 hour(s))  Culture, blood (Routine x 2)     Status: None   Collection Time: 10/15/17 10:05 AM  Result Value Ref Range Status   Specimen Description BLOOD RIGHT FOREARM  Final   Special Requests   Final    BOTTLES DRAWN AEROBIC AND ANAEROBIC Blood Culture results may not be optimal due to an inadequate volume of blood received in culture bottles   Culture   Final    NO GROWTH 5 DAYS Performed at [facility], [address] [place], [place] [address]    Report Status 10/20/2017 FINAL  Final  Culture, blood (Routine x 2)     Status: None   Collection Time: 10/15/17 10:27 AM  Result Value Ref Range Status   Specimen Description BLOOD RIGHT HAND  Final   Special Requests   Final    BOTTLES DRAWN AEROBIC ONLY Blood Culture results may not be optimal due to an inadequate volume of blood received in culture bottles   Culture   Final    NO GROWTH 5  DAYS Performed at [facility], [address] [place], [place] [address]    Report Status 10/20/2017 FINAL  Final     Labs: BNP (last 3 results) Recent Labs    10/04/17 1936 10/15/17 1010 10/19/17 0314  BNP 1,201.7* 1,050.5* 859.8*   Basic Metabolic Panel: Recent Labs  Lab 10/15/17 1352  10/18/17 0223 10/19/17 0314 10/20/17 0508 10/21/17 0343 10/22/17 0231  NA  --    < > 141 139 140 140 140  K  --    < > 3.8 4.5 3.5 3.8 3.7  CL  --    < > 98 99 99 99 97*  CO2  --    < > 33* 29 31 31 31   GLUCOSE  --    < > 101* 145* 109* 124* 106*  BUN  --    < > 52* 47* 38* 40* 43*  CREATININE  --    < > 1.33* 1.61* 1.37* 1.49* 1.46*  CALCIUM  --    < > 9.4 9.4 9.1 9.2 9.6  MG 1.9  --   --   --   --  2.1  --    < > = values in this interval not displayed.   Liver Function Tests: Recent Labs  Lab 10/15/17 0951  AST 16  ALT 31  ALKPHOS 72  BILITOT 1.6*  PROT 5.7*  ALBUMIN 3.0*   No results for input(s): LIPASE, AMYLASE in the last 168 hours. No results for input(s): AMMONIA in the last 168 hours. CBC: Recent Labs  Lab 10/15/17 0951  10/18/17 0223 10/19/17 0314 10/20/17 0508 10/21/17 0343 10/22/17 0231  WBC 23.7*   < > 18.2* 20.1* 17.1* 19.6* 18.3*  NEUTROABS 20.7*  --   --   --   --   --   --   HGB 12.5   < > 11.9* 13.0 11.5* 11.5* 12.0  HCT 41.2   < > 38.1 42.4 37.6 37.4 39.2  MCV 89.4   < > 87.2 88.5 88.5 87.6 88.1  PLT 231   < > 207 165 178 193 189   < > = values in this interval not displayed.   Cardiac Enzymes: No results for input(s): CKTOTAL, CKMB, CKMBINDEX, TROPONINI in the last 168 hours. BNP: Invalid  input(s): POCBNP CBG: No results for input(s): GLUCAP in the last 168 hours. D-Dimer No results for input(s): DDIMER in the last 72 hours. Hgb A1c No results for input(s): HGBA1C in the last 72 hours. Lipid Profile No results for input(s): CHOL, HDL, LDLCALC, TRIG, CHOLHDL, LDLDIRECT in the last 72 hours. Thyroid function studies No results  for input(s): TSH, T4TOTAL, T3FREE, THYROIDAB in the last 72 hours.  Invalid input(s): FREET3 Anemia work up No results for input(s): VITAMINB12, FOLATE, FERRITIN, TIBC, IRON, RETICCTPCT in the last 72 hours. Urinalysis    Component Value Date/Time   COLORURINE STRAW (A) 10/15/2017 1253   APPEARANCEUR CLEAR 10/15/2017 1253   LABSPEC 1.008 10/15/2017 1253   PHURINE 7.0 10/15/2017 1253   GLUCOSEU NEGATIVE 10/15/2017 1253   HGBUR NEGATIVE 10/15/2017 1253   BILIRUBINUR NEGATIVE 10/15/2017 1253   KETONESUR NEGATIVE 10/15/2017 1253   PROTEINUR NEGATIVE 10/15/2017 1253   UROBILINOGEN 1.0 01/10/2015 2237   NITRITE NEGATIVE 10/15/2017 1253   LEUKOCYTESUR NEGATIVE 10/15/2017 1253   Sepsis Labs Invalid input(s): PROCALCITONIN,  WBC,  LACTICIDVEN Microbiology Recent Results (from the past 240 hour(s))  Culture, blood (Routine x 2)     Status: None   Collection Time: 10/15/17 10:05 AM  Result Value Ref Range Status   Specimen Description BLOOD RIGHT FOREARM  Final   Special Requests   Final    BOTTLES DRAWN AEROBIC AND ANAEROBIC Blood Culture results may not be optimal due to an inadequate volume of blood received in culture bottles   Culture   Final    NO GROWTH 5 DAYS Performed at [facility], [address], [place], [place] [address]    Report Status 10/20/2017 FINAL  Final  Culture, blood (Routine x 2)     Status: None   Collection Time: 10/15/17 10:27 AM  Result Value Ref Range Status   Specimen Description BLOOD RIGHT HAND  Final   Special Requests   Final    BOTTLES DRAWN AEROBIC ONLY Blood Culture results may not be optimal due to an inadequate volume of blood received in culture bottles   Culture   Final    NO GROWTH 5 DAYS Performed at [facility], [address], [place], [place] [address]    Report Status 10/20/2017 FINAL  Final     Time coordinating discharge: 33  minutes  SIGNED:   Alwyn Ren, MD  [facility] 10/22/2017, 8:11  AM Pager   If 7PM-7AM, please contact night-coverage [email or web address] Password [email or web address]

## 2017-10-22 NOTE — Progress Notes (Signed)
Physical Therapy Treatment Patient Details Name: Tonya Farley MRN: [id number] DOB: 09/08/1919 Today's Date: 10/22/2017    History of Present Illness Pt is a 84 y.o. female admitted from [facility] on 10/15/17 with worsening cough and SOB; found to have respiratory failure and sepsis, likely secondary to pneumonia. PMH includes kyphoplasty (09/26/17), CHF, CAD, chronic a-fib, CVA, CKD IV.    PT Comments    Pt reports feeling very tired this morning but is willing to get into recliner to eat the remainder of her breakfast. Pt continues to make progress towards her goals, however she continues to be limited in safe mobility by decreased strength and endurance. Pt requires min A for bed mobility and transfers to RW and min guard for lateral stepping transfer to chair. Pt is d/c'ing back to her SNF where she will continue to work with PT to improve her mobility.    Follow Up Recommendations  SNF;Supervision for mobility/OOB     Equipment Recommendations  None recommended by PT    Recommendations for Other Services       Precautions / Restrictions Precautions Precautions: Fall;Back;Other (comment) Precaution Comments: Kyphoplasty 09/26/17; urine incontinence  Restrictions Weight Bearing Restrictions: No    Mobility  Bed Mobility Overal bed mobility: Needs Assistance Bed Mobility: Supine to Sit     Supine to sit: Min assist     General bed mobility comments: min A for bringing trunk to upright   Transfers Overall transfer level: Needs assistance Equipment used: Rolling walker (2 wheeled) Transfers: Sit to/from Stand Sit to Stand: Min assist         General transfer comment: minA for steadying in standing  Ambulation/Gait Ambulation/Gait assistance: Min guard Gait Distance (Feet): 2 Feet Assistive device: Rolling walker (2 wheeled) Gait Pattern/deviations: Step-to pattern Gait velocity: stepping transfer to recliner with min guard assist Gait velocity interpretation:  <1.31 ft/sec, indicative of household ambulator General Gait Details: fatigued by standing pericare and only ambulated around bed to recliner         Balance Overall balance assessment: Needs assistance Sitting-balance support: Feet supported;No upper extremity supported Sitting balance-Leahy Scale: Fair     Standing balance support: During functional activity;Bilateral upper extremity supported Standing balance-Leahy Scale: Poor Standing balance comment: requires UE support on RW                             Cognition Arousal/Alertness: Awake/alert Behavior During Therapy: WFL for tasks assessed/performed Overall Cognitive Status: Within Functional Limits for tasks assessed                                           General Comments General comments (skin integrity, edema, etc.): VSS      Pertinent Vitals/Pain Pain Assessment: No/denies pain           PT Goals (current goals can now be found in the care plan section) Acute Rehab PT Goals Patient Stated Goal: Finish working with rehab at [facility] to get stronger before returning home PT Goal Formulation: With patient Time For Goal Achievement: 10/30/17 Potential to Achieve Goals: Good Progress towards PT goals: Progressing toward goals    Frequency    Min 3X/week      PT Plan Current plan remains appropriate       AM-PAC PT "6 Clicks" Daily Activity  Outcome Measure  Difficulty  turning over in bed (including adjusting bedclothes, sheets and blankets)?: None Difficulty moving from lying on back to sitting on the side of the bed? : A Little Difficulty sitting down on and standing up from a chair with arms (e.g., wheelchair, bedside commode, etc,.)?: Unable Help needed moving to and from a bed to chair (including a wheelchair)?: A Little Help needed walking in hospital room?: A Little Help needed climbing 3-5 steps with a railing? : A Lot 6 Click Score: 16    End of Session  Equipment Utilized During Treatment: Gait belt Activity Tolerance: Patient tolerated treatment well;Patient limited by fatigue Patient left: in chair;with call bell/phone within reach;with chair alarm set Nurse Communication: Mobility status PT Visit Diagnosis: Other abnormalities of gait and mobility (R26.89);Muscle weakness (generalized) (M62.81)     Time: [id number] PT Time Calculation (min) (ACUTE ONLY): 18 min  Charges:  $Therapeutic Activity: 8-22 mins                     Tonya Farley PT, DPT Acute Rehabilitation  [phone] Pager [phone]     Tonya Farley 10/22/2017, 12:33 PM

## 2017-10-28 ENCOUNTER — Telehealth: Payer: Self-pay | Admitting: Radiology

## 2017-10-28 NOTE — Telephone Encounter (Signed)
Spoke with family member, Johnny BridgeMartha.  She states that patient is doing well 1 month post kyphoplasty.  The pain is comfortable and has no complaints of back pain at the time.  The patient has been admitted to [facility] for rehab after recent hospitalization for pneumonia.    Since the patient has no complaints of back pain, the family prefers to bring her in for follow up as needed.  Caili Escalera [place], RN 10/28/2017 10:03 AM

## 2017-11-10 NOTE — Progress Notes (Signed)
[facility] [address] [place] [id number] [phone]       OFFICE FOLLOW UP NOTE  [name] Date of Birth:  10/27/1919 Medical Record Number:  [id number]   Reason for Referral:  hospital stroke follow up  CHIEF COMPLAINT:  Chief Complaint  Patient presents with  . Follow-up    Follow up Stroke hospital follow up pt was discharge from facility last wednesday, pt is with Darel HongJudy neice    HPI: Tonya Farley is being seen today for initial visit in the office for small left frontal white matter infarct due to small vessel disease or cardioembolic while off Coumadin on 10/04/17. History obtained from patient, daughter and chart review. Reviewed all radiology images and labs personally.  Tonya Farley is a 84 y.o. female with history of HLD, HTN, AF on warfarin, sCHF, CKD, back pain w/ recent kyphoplasty 09/26/2017 presenting with generalized weakness and transient confusion.  MRI head reviewed and showed small left frontal white matter infarct.  MRA was negative for LVO or flow-limiting stenosis.  Carotid Doppler showed bilateral ICA stenosis of 1 to 39%.  2D echo showed an EF of 30 to 35% with pulmonary hypertension.  LDL 52 and recommended continuation of Lipitor 10 mg.  HTN stable throughout admission and long-term BP goal normotensive range.  Patient was taking warfarin daily for AF management and INR 3.56 on admission.  Given location of the stroke, most likely etiology small vessel disease but possibly related to known AF as or fairly recently stopped for T7 kyphoplasty which was performed on 09/26/2017 and warfarin restarted on 10/04/2017.  INR goal 2.3. Patient d/c'd to nursing home for continued therapies.  Patient is being seen today for hospital follow-up and is accompanied by her daughter.  She has recently returned home after being in a nursing home for 20 days for continued therapies.  Patient feels as though she did not receive enough  exercise motivation during her stay in the nursing home therefore is experiencing general deconditioning.  She currently receives home PT for which she will have for 3 weeks 2 times weekly.  She currently has care during the day by a hired help and will be by herself approximately 4 hours and then her daughter will spend the night.  She states that this will not be lasting for much longer but is planning on placing her in independent living versus assisted living.  Patient is currently sitting in wheelchair as she is unable to ambulate long distances without increased fatigue.  Daughter states she has a lack of motivation at home and it is difficult for her to be active due to increased fatigue.  Denies any deficits on memory.  Continues to take warfarin without side effects of bleeding or bruising and will be having INR level checked today but otherwise has been stable.  Continues to take Lipitor without side effects myalgias.  Blood pressure today 143/71.  Denies new or worsening stroke/TIA symptoms.     ROS:   14 system review of systems performed and negative with exception of hearing loss, runny nose, cough, shortness of breath, restless leg, daytime sleepiness, walking difficulty, weakness and tremors  PMH:  Past Medical History:  Diagnosis Date  . Chronic a-fib (HCC)   . Chronic back pain   . Chronic systolic (congestive) heart failure (HCC)    reduced EF since 2018 to 35-40%  . CKD (chronic kidney disease) stage 4, GFR 15-29 ml/min (HCC) 09/11/2015  .  Hyperlipidemia   . Hypertension   . Hypothyroidism   . Pulmonary hypertension (HCC)    PASP 65 mmHg at Echo 3/17  . Stroke (HCC)   . Thyroid disease     PSH:  Past Surgical History:  Procedure Laterality Date  . APPENDECTOMY    . cataracts     bilateral  . EYE SURGERY     left eye "hole"  . IR KYPHO THORACIC WITH BONE BIOPSY  09/26/2017  . IR RADIOLOGIST EVAL & MGMT  09/18/2017  . TONSILLECTOMY      Social History:  Social  History   Socioeconomic History  . Marital status: Widowed    Spouse name: Not on file  . Number of children: Not on file  . Years of education: Not on file  . Highest education level: Not on file  Occupational History  . Not on file  Social Needs  . Financial resource strain: Not on file  . Food insecurity:    Worry: Patient refused    Inability: Patient refused  . Transportation needs:    Medical: Not on file    Non-medical: Not on file  Tobacco Use  . Smoking status: Never Smoker  . Smokeless tobacco: Never Used  Substance and Sexual Activity  . Alcohol use: No  . Drug use: No  . Sexual activity: Not Currently    Birth control/protection: None  Lifestyle  . Physical activity:    Days per week: Not on file    Minutes per session: Not on file  . Stress: Not on file  Relationships  . Social connections:    Talks on phone: Not on file    Gets together: Not on file    Attends religious service: Not on file    Active member of club or organization: Not on file    Attends meetings of clubs or organizations: Not on file    Relationship status: Not on file  . Intimate partner violence:    Fear of current or ex partner: Not on file    Emotionally abused: Not on file    Physically abused: Not on file    Forced sexual activity: Not on file  Other Topics Concern  . Not on file  Social History Narrative  . Not on file    Family History:  Family History  Problem Relation Age of Onset  . Heart disease Sister   . Heart disease Sister   . Cancer Mother   . Other Father   . Heart attack Father     Medications:   Current Outpatient Medications on File Prior to Visit  Medication Sig Dispense Refill  . allopurinol (ZYLOPRIM) 100 MG tablet     . atorvastatin (LIPITOR) 10 MG tablet Take 10 mg by mouth daily.    . B Complex Vitamins (VITAMIN B COMPLEX) TABS Take 1 tablet by mouth daily with lunch.    . brimonidine (ALPHAGAN) 0.15 % ophthalmic solution Place 1 drop into  both eyes 3 (three) times daily.    . cholecalciferol (VITAMIN D) 400 units TABS tablet Take 400 Units by mouth daily.    Marland Kitchen diltiazem (CARDIZEM CD) 120 MG 24 hr capsule Take 1 capsule (120 mg total) by mouth daily. 90 capsule 1  . furosemide (LASIX) 40 MG tablet Take 1 tablet (40 mg total) by mouth 2 (two) times daily. 60 tablet 0  . guaiFENesin-dextromethorphan (ROBITUSSIN DM) 100-10 MG/5ML syrup Take 5 mLs by mouth every 4 (four) hours as needed for  cough. 118 mL 0  . isosorbide mononitrate (IMDUR) 30 MG 24 hr tablet Take 1 tablet (30 mg total) by mouth daily. 90 tablet 1  . latanoprost (XALATAN) 0.005 % ophthalmic solution Place 1 drop into both eyes at bedtime.  3  . levalbuterol (XOPENEX) 0.63 MG/3ML nebulizer solution Take 3 mLs (0.63 mg total) by nebulization 3 (three) times daily for 20 days. And q4hours as needed for SOB/wheezing (Patient taking differently: Take 0.63 mg by nebulization See admin instructions. Take 3 mls by Nebulization three times daily for 20 days, then change to every 4 hours as needed for shortness of breath and wheezing) 60 mL 2  . levothyroxine (SYNTHROID, LEVOTHROID) 88 MCG tablet Take 88 mcg by mouth daily before breakfast.    . metoprolol succinate (TOPROL-XL) 25 MG 24 hr tablet Take 0.5 tablets (12.5 mg total) by mouth daily. 30 tablet 0  . mometasone-formoterol (DULERA) 100-5 MCG/ACT AERO Inhale 2 puffs into the lungs 2 (two) times daily.    . ondansetron (ZOFRAN) 4 MG tablet Take 4 mg by mouth every 4 (four) hours as needed for nausea or vomiting.    Bertram Gala. Polyethyl Glycol-Propyl Glycol (SYSTANE OP) Apply 2 drops to eye daily as needed (dry eyes).     . potassium chloride SA (K-DUR,KLOR-CON) 20 MEQ tablet Take 0.5 tablets (10 mEq total) by mouth daily.    Marland Kitchen. spironolactone (ALDACTONE) 25 MG tablet TAKE 1/2 TABLET BY MOUTH ON TUESDAY, THURSDAY AND SATURDAY  0  . timolol (TIMOPTIC) 0.5 % ophthalmic solution Place 1 drop into both eyes daily.   3  . warfarin (COUMADIN) 1  MG tablet TAKE 2.5 TABLETS DAILY AS DIRECTED BY COUMADIN CLINIC  3  . warfarin (COUMADIN) 2 MG tablet Take 2 mg by mouth at bedtime.    . metoprolol tartrate (LOPRESSOR) 25 MG tablet Take 12.5 mg by mouth daily.  0   No current facility-administered medications on file prior to visit.     Allergies:   Allergies  Allergen Reactions  . Penicillins Hives and Swelling    Has patient had a PCN reaction causing immediate rash, facial/tongue/throat swelling, SOB or lightheadedness with hypotension: Yes Has patient had a PCN reaction causing severe rash involving mucus membranes or skin necrosis: Yes Has patient had a PCN reaction that required hospitalization No Has patient had a PCN reaction occurring within the last 10 years: No If all of the above answers are "NO", then may proceed with Cephalosporin use.    . Sulfa Antibiotics Anaphylaxis and Swelling  . Fish Allergy Nausea And Vomiting     Physical Exam  Vitals:   11/11/17 0827  BP: (!) 143/71  Pulse: 82  Weight: 125 lb (56.7 kg)  Height: 5\' 6"  (1.676 m)   Body mass index is 20.18 kg/m. No exam data present  General: Frail elderly pleasant Caucasian female, seated, in no evident distress Head: head normocephalic and atraumatic.   Neck: supple with no carotid or supraclavicular bruits Cardiovascular: regular rate and rhythm, no murmurs Musculoskeletal: no deformity Skin:  no rash/petichiae Vascular:  Normal pulses all extremities  Neurologic Exam Mental Status: Awake and fully alert. Oriented to place and time. Recent and remote memory intact. Attention span, concentration and fund of knowledge appropriate. Mood and affect appropriate.  Cranial Nerves: Fundoscopic exam reveals sharp disc margins. Pupils equal, briskly reactive to light. Extraocular movements full without nystagmus. Visual fields full to confrontation. Hearing intact. Facial sensation intact. Face, tongue, palate moves normally and symmetrically.  Motor:  Normal bulk and tone.  Generalized weakness throughout all extremities. Sensory.: intact to touch , pinprick , position and vibratory sensation.  Coordination: Rapid alternating movements normal in all extremities. Finger-to-nose and heel-to-shin performed accurately bilaterally. Gait and Station: Patient currently sitting in wheelchair and does not have rolling walker at appointment therefore gait was deferred Reflexes: 1+ and symmetric. Toes downgoing.    NIHSS  0 Modified Rankin  3 HAS-BLED 2 CHA2DS2-VASc 5   Diagnostic Data (Labs, Imaging, Testing)  MR brain without contrast 10/06/2017 IMPRESSION: 1. Single focus of hyperintensity on diffusion-weighted imaging within the left frontal white matter is likely a early subacute infarct. No associated hemorrhage or mass effect. 2. Otherwise normal MRI of the aging brain.  MRI MRA head without contrast 10/06/2017 IMPRESSION: 1. No emergent large vessel occlusion or flow-limiting stenosis.  VAS US carotid duplex bilateral 10/07/2017 Final Interpretation: Right Carotid: Velocities in the right ICA are consistent with a 1-39% stenosis. Left Carotid: Velocities in the left ICA are consistent with a 1-39% stenosis. Vertebrals: Bilateral vertebral arteries demonstrate antegrade flow. Subclavians: Normal flow hemodynamics were seen in bilateral subclavian arteries  Echocardiogram 10/07/2017  Study Conclusions  - Left ventricle: The cavity size was normal. Systolic function was   moderately to severely reduced. The estimated ejection fraction   was in the range of 30% to 35%. Diffuse hypokinesis. - Aortic valve: There was trivial regurgitation. - Mitral valve: Calcified annulus. Mildly thickened leaflets . - Left atrium: The atrium was severely dilated. - Right ventricle: The cavity size was mildly dilated. - Right atrium: The atrium was severely dilated. - Tricuspid valve: There was moderate-severe regurgitation. - Pulmonary  arteries: Systolic pressure was severely increased. PA   peak pressure: 71 mm Hg (S).  Impressions:  - Moderate to severe global reduction in LV systolic function;   trace AI; severe biatrial enlargement; mild RVE; moderate to   severe TR with severe pulmonary hypertension.     ASSESSMENT: Tonya Farley is a 84 y.o. year old female here with left frontal infarct on 10/06/2017 secondary to small vessel disease versus cardioembolic due to AF of Coumadin. Vascular risk factors include HTN, HLD, and AF.  Patient returns today for stroke follow-up and does have continued complaints of generalized fatigue but unsure if this is due to stroke versus general deconditioning from being in a nursing home.    PLAN: -Continue warfarin daily  and Lipitor for secondary stroke prevention -F/u with PCP regarding your HLD and HTN management -Continue home PT and advised daughter if additional orders are needed to please notify us -continue to monitor BP at home -Agree with independent versus assisted living facility as patient is not safe to be living independently at home without 24-hour supervision due to fall risk -Maintain strict control of hypertension with blood pressure goal below 130/90, diabetes with hemoglobin A1c goal below 6.5% and cholesterol with LDL cholesterol (bad cholesterol) goal below 70 mg/dL. I also advised the patient to eat a healthy diet with plenty of whole grains, cereals, fruits and vegetables, exercise regularly and maintain ideal body weight.  Follow up in 3 months or call earlier if needed   Greater than 50% of time during this 25 minute visit was spent on counseling,explanation of diagnosis of left frontal infarct, reviewing risk factor management of HLD and HTN, planning of further management, discussion with patient and family and coordination of care    George Hugh, AGNP-BC  [facility] [address] [place], [place]  [address]  Phone [phone] Fax [phone]

## 2017-11-11 ENCOUNTER — Ambulatory Visit (INDEPENDENT_AMBULATORY_CARE_PROVIDER_SITE_OTHER): Payer: Medicare Other | Admitting: *Deleted

## 2017-11-11 ENCOUNTER — Ambulatory Visit (INDEPENDENT_AMBULATORY_CARE_PROVIDER_SITE_OTHER): Payer: Medicare Other | Admitting: Physician Assistant

## 2017-11-11 ENCOUNTER — Encounter: Payer: Self-pay | Admitting: Adult Health

## 2017-11-11 ENCOUNTER — Ambulatory Visit (INDEPENDENT_AMBULATORY_CARE_PROVIDER_SITE_OTHER): Payer: Medicare Other | Admitting: Adult Health

## 2017-11-11 ENCOUNTER — Encounter: Payer: Self-pay | Admitting: Physician Assistant

## 2017-11-11 VITALS — BP 143/71 | HR 82 | Ht 66.0 in | Wt 125.0 lb

## 2017-11-11 VITALS — BP 138/50 | HR 60 | Ht 66.0 in | Wt 126.1 lb

## 2017-11-11 DIAGNOSIS — Z5181 Encounter for therapeutic drug level monitoring: Secondary | ICD-10-CM

## 2017-11-11 DIAGNOSIS — E785 Hyperlipidemia, unspecified: Secondary | ICD-10-CM | POA: Diagnosis not present

## 2017-11-11 DIAGNOSIS — Z8673 Personal history of transient ischemic attack (TIA), and cerebral infarction without residual deficits: Secondary | ICD-10-CM

## 2017-11-11 DIAGNOSIS — I482 Chronic atrial fibrillation, unspecified: Secondary | ICD-10-CM

## 2017-11-11 DIAGNOSIS — I10 Essential (primary) hypertension: Secondary | ICD-10-CM | POA: Diagnosis not present

## 2017-11-11 DIAGNOSIS — I5022 Chronic systolic (congestive) heart failure: Secondary | ICD-10-CM | POA: Diagnosis not present

## 2017-11-11 DIAGNOSIS — I4891 Unspecified atrial fibrillation: Secondary | ICD-10-CM | POA: Diagnosis not present

## 2017-11-11 DIAGNOSIS — I63411 Cerebral infarction due to embolism of right middle cerebral artery: Secondary | ICD-10-CM

## 2017-11-11 DIAGNOSIS — I63422 Cerebral infarction due to embolism of left anterior cerebral artery: Secondary | ICD-10-CM

## 2017-11-11 DIAGNOSIS — I272 Pulmonary hypertension, unspecified: Secondary | ICD-10-CM

## 2017-11-11 LAB — POCT INR: INR: 1.9 — AB (ref 2.0–3.0)

## 2017-11-11 MED ORDER — SPIRONOLACTONE 25 MG PO TABS: ORAL_TABLET | ORAL | 11 refills | Status: AC

## 2017-11-11 NOTE — Progress Notes (Addendum)
Cardiology Office Note    Date:  11/11/2017   ID:  Tonya Farley, DOB [birth year], MRN [id number]  PCP:  Burton Apley, MD  Cardiologist: Lance Muss, MD  Chief Complaint  Patient presents with  . Hospitalization Follow-up    History of Present Illness:  Tonya Farley is a 84 y.o. female with history of persistent atrial fibrillation on Coumadin,, hypertension, CKD stage III, pulmonary hypertension who is status post recent kyphoplasty7/07/2017 who suffered a small left frontal white matter stroke while off Coumadin 10/04/2017 felt to be due to small vessel disease or cardioembolic although INR was 3.56 on admission. 2D echo 07/11/17 LVEF 30 to 35% with pulmonary hypertension.  Carotids 1 to 39% bilaterally.  Saw neurologist today.  We did not see the patient during this hospitalization.She also had HCAP with viral bronchitis and CHF.  We last saw the patient when she was hospitalized 06/2017 with CHF.  She had severe pulmonary hypertension at that time with PASP of 86 mmHg. She saw Dr. Eldridge Dace 09/05/2017 at which time she was compensated.  Patient comes in today accompanied by her neice.  They are very upset about her hospitalization and the fact that we did not see her despite their asking.  Patient is back living at home alone with caregivers.  They are looking for an assisted living for her.  Overall she is feeling better.  No edema.  Does have dyspnea on exertion but is deconditioned and will start physical therapy at home to gain her strength back.  Patient was sleep all day if she could.    Past Medical History:  Diagnosis Date  . Chronic a-fib (HCC)   . Chronic back pain   . Chronic systolic (congestive) heart failure (HCC)    reduced EF since 2018 to 35-40%  . CKD (chronic kidney disease) stage 4, GFR 15-29 ml/min (HCC) 09/11/2015  . Hyperlipidemia   . Hypertension   . Hypothyroidism   . Pulmonary hypertension (HCC)    PASP 65 mmHg at Echo 3/17  . Stroke (HCC)    . Thyroid disease     Past Surgical History:  Procedure Laterality Date  . APPENDECTOMY    . cataracts     bilateral  . EYE SURGERY     left eye "hole"  . IR KYPHO THORACIC WITH BONE BIOPSY  09/26/2017  . IR RADIOLOGIST EVAL & MGMT  09/18/2017  . TONSILLECTOMY      Current Medications: Current Meds  Medication Sig  . allopurinol (ZYLOPRIM) 100 MG tablet   . atorvastatin (LIPITOR) 10 MG tablet Take 10 mg by mouth daily.  . B Complex Vitamins (VITAMIN B COMPLEX) TABS Take 1 tablet by mouth daily with lunch.  . brimonidine (ALPHAGAN) 0.15 % ophthalmic solution Place 1 drop into both eyes 3 (three) times daily.  . cholecalciferol (VITAMIN D) 400 units TABS tablet Take 400 Units by mouth daily.  Marland Kitchen diltiazem (CARDIZEM CD) 120 MG 24 hr capsule Take 1 capsule (120 mg total) by mouth daily.  . furosemide (LASIX) 40 MG tablet Take 1 tablet (40 mg total) by mouth 2 (two) times daily.  Marland Kitchen guaiFENesin-dextromethorphan (ROBITUSSIN DM) 100-10 MG/5ML syrup Take 5 mLs by mouth every 4 (four) hours as needed for cough.  . isosorbide mononitrate (IMDUR) 30 MG 24 hr tablet Take 1 tablet (30 mg total) by mouth daily.  Marland Kitchen latanoprost (XALATAN) 0.005 % ophthalmic solution Place 1 drop into both eyes at bedtime.  . levalbuterol (XOPENEX) 0.63 MG/3ML nebulizer  solution Take 3 mLs (0.63 mg total) by nebulization 3 (three) times daily for 20 days. And q4hours as needed for SOB/wheezing (Patient taking differently: Take 0.63 mg by nebulization See admin instructions. Take 3 mls by Nebulization three times daily for 20 days, then change to every 4 hours as needed for shortness of breath and wheezing)  . levothyroxine (SYNTHROID, LEVOTHROID) 88 MCG tablet Take 88 mcg by mouth daily before breakfast.  . metoprolol succinate (TOPROL-XL) 25 MG 24 hr tablet Take 0.5 tablets (12.5 mg total) by mouth daily.  . metoprolol tartrate (LOPRESSOR) 25 MG tablet Take 12.5 mg by mouth daily.  . mometasone-formoterol (DULERA) 100-5  MCG/ACT AERO Inhale 2 puffs into the lungs 2 (two) times daily.  . ondansetron (ZOFRAN) 4 MG tablet Take 4 mg by mouth every 4 (four) hours as needed for nausea or vomiting.  Bertram Gala. Polyethyl Glycol-Propyl Glycol (SYSTANE OP) Apply 2 drops to eye daily as needed (dry eyes).   . potassium chloride SA (K-DUR,KLOR-CON) 20 MEQ tablet Take 0.5 tablets (10 mEq total) by mouth daily.  Marland Kitchen. spironolactone (ALDACTONE) 25 MG tablet TAKE 1/2 TABLET BY MOUTH ON TUESDAY, THURSDAY AND SATURDAY  . timolol (TIMOPTIC) 0.5 % ophthalmic solution Place 1 drop into both eyes daily.   Marland Kitchen. warfarin (COUMADIN) 1 MG tablet TAKE 2.5 TABLETS DAILY AS DIRECTED BY COUMADIN CLINIC  . warfarin (COUMADIN) 2 MG tablet Take 2 mg by mouth at bedtime.  . [DISCONTINUED] spironolactone (ALDACTONE) 25 MG tablet TAKE 1/2 TABLET BY MOUTH ON TUESDAY, THURSDAY AND SATURDAY     Allergies:   Penicillins; Sulfa antibiotics; and Fish allergy   Social History   Socioeconomic History  . Marital status: Widowed    Spouse name: Not on file  . Number of children: Not on file  . Years of education: Not on file  . Highest education level: Not on file  Occupational History  . Not on file  Social Needs  . Financial resource strain: Not on file  . Food insecurity:    Worry: Patient refused    Inability: Patient refused  . Transportation needs:    Medical: Not on file    Non-medical: Not on file  Tobacco Use  . Smoking status: Never Smoker  . Smokeless tobacco: Never Used  Substance and Sexual Activity  . Alcohol use: No  . Drug use: No  . Sexual activity: Not Currently    Birth control/protection: None  Lifestyle  . Physical activity:    Days per week: Not on file    Minutes per session: Not on file  . Stress: Not on file  Relationships  . Social connections:    Talks on phone: Not on file    Gets together: Not on file    Attends religious service: Not on file    Active member of club or organization: Not on file    Attends meetings  of clubs or organizations: Not on file    Relationship status: Not on file  Other Topics Concern  . Not on file  Social History Narrative  . Not on file     Family History:  The patient's family history includes Cancer in her mother; Heart attack in her father; Heart disease in her sister and sister; Other in her father.   ROS:   Please see the history of present illness.    Review of Systems  Constitution: Positive for malaise/fatigue.  HENT: Negative.   Eyes: Negative.   Cardiovascular: Positive for dyspnea on exertion.  Respiratory: Negative.   Hematologic/Lymphatic: Negative.   Musculoskeletal: Negative.  Negative for joint pain.  Gastrointestinal: Negative.   Genitourinary: Negative.   Neurological: Positive for difficulty with concentration and weakness.  Psychiatric/Behavioral: Positive for memory loss.   All other systems reviewed and are negative.   PHYSICAL EXAM:   VS:  BP (!) 138/50   Pulse 60   Ht 5\' 6"  (1.676 m)   Wt 126 lb 1.9 oz (57.2 kg)   SpO2 98%   BMI 20.36 kg/m   Physical Exam  GEN: Young looking 85 year old, in no acute distress  Neck: no JVD, carotid bruits, or masses Cardiac: Irregular irregular; no murmurs, rubs, or gallops  Respiratory: Decreased breath sounds at the left lung base with few crackles otherwise clear GI: soft, nontender, nondistended, + BS Ext: without cyanosis, clubbing, or edema, Good distal pulses bilaterally MS: no deformity or atrophy  Skin: warm and dry, no rash Neuro:  Alert and Oriented x 3, Strength and sensation are intact Psych: euthymic mood, full affect  Wt Readings from Last 3 Encounters:  11/11/17 126 lb 1.9 oz (57.2 kg)  11/11/17 125 lb (56.7 kg)  10/22/17 128 lb 1.4 oz (58.1 kg)      Studies/Labs Reviewed:   EKG:  EKG is not ordered today.   Recent Labs: 07/09/2017: TSH 3.384 10/15/2017: ALT 31 10/19/2017: B Natriuretic Peptide 859.8 10/21/2017: Magnesium 2.1 10/22/2017: BUN 43; Creatinine, Ser 1.46;  Hemoglobin 12.0; Platelets 189; Potassium 3.7; Sodium 140   Lipid Panel    Component Value Date/Time   CHOL 155 11/28/2015 1535   TRIG 122 11/28/2015 1535   HDL 58 11/28/2015 1535   CHOLHDL 2.7 11/28/2015 1535   VLDL 24 11/28/2015 1535   LDLCALC 73 11/28/2015 1535   LDLDIRECT 52 10/06/2017 1426    Additional studies/ records that were reviewed today include:     MR brain without contrast 10/06/2017 IMPRESSION: 1. Single focus of hyperintensity on diffusion-weighted imaging within the left frontal white matter is likely a early subacute infarct. No associated hemorrhage or mass effect. 2. Otherwise normal MRI of the aging brain.   MRI MRA head without contrast 10/06/2017 IMPRESSION: 1. No emergent large vessel occlusion or flow-limiting stenosis.   VAS US carotid duplex bilateral 10/07/2017 Final Interpretation: Right Carotid: Velocities in the right ICA are consistent with a 1-39% stenosis. Left Carotid: Velocities in the left ICA are consistent with a 1-39% stenosis. Vertebrals:  Bilateral vertebral arteries demonstrate antegrade flow. Subclavians: Normal flow hemodynamics were seen in bilateral subclavian arteries   Echocardiogram 10/07/2017  Study Conclusions   - Left ventricle: The cavity size was normal. Systolic function was   moderately to severely reduced. The estimated ejection fraction   was in the range of 30% to 35%. Diffuse hypokinesis. - Aortic valve: There was trivial regurgitation. - Mitral valve: Calcified annulus. Mildly thickened leaflets . - Left atrium: The atrium was severely dilated. - Right ventricle: The cavity size was mildly dilated. - Right atrium: The atrium was severely dilated. - Tricuspid valve: There was moderate-severe regurgitation. - Pulmonary arteries: Systolic pressure was severely increased. PA   peak pressure: 71 mm Hg (S).   Impressions:   - Moderate to severe global reduction in LV systolic function;   trace AI; severe  biatrial enlargement; mild RVE; moderate to   severe TR with severe pulmonary hypertension. Echocardiogram 07/11/2017: Study Conclusions   - Left ventricle: The cavity size was normal. Wall thickness was   normal. Systolic function was moderately reduced.  The estimated   ejection fraction was in the range of 35% to 40%. Diffuse   hypokinesis. - Ventricular septum: The contour showed diastolic flattening and   systolic flattening. These changes are consistent with RV volume   and pressure overload. - Aortic valve: Right coronary cusp mobility was mildly restricted. - Mitral valve: There was mild regurgitation. - Left atrium: The atrium was moderately to severely dilated. - Right ventricle: The cavity size was moderately dilated. Systolic   function was moderately reduced. - Right atrium: The atrium was severely dilated. - Tricuspid valve: There was moderate regurgitation directed   centrally. - Pulmonary arteries: Systolic pressure was severely increased. PA   peak pressure: 86 mm Hg (S). - Pericardium, extracardiac: A trivial pericardial effusion was   identified.      ASSESSMENT:    1. Chronic systolic CHF (congestive heart failure) (HCC), EF 35%   2. Chronic a-fib (HCC)   3. Pulmonary hypertension, moderate to severe (HCC)   4. Recent cerebrovascular accident (CVA)      PLAN:  In order of problems listed above:  Chronic systolic CHF 2D echo 10/07/2017 EF 35 to 40% with moderate to severe pulmonary hypertension-trouble in the hospital with heart failure and respiratory failure with pneumonia now compensated.  Continue Lasix 40 mg twice daily check renal function today.  Follow-up with Dr. Eldridge DaceVaranasi next available.  Moderate to severe pulmonary hypertension similar to echo 06/2017  Chronic atrial fibrillation on Coumadin INR being checked today  Recent CVA 09/2014.  Patient was off Coumadin for back surgery and came in with a CVA after being back on Coumadin with INR of 3.56.   Stroke felt to be secondary to small vessel disease versus cardioembolic from A. fib and being off Coumadin for surgery.  Being followed by neurology and receiving physical therapy.      Medication Adjustments/Labs and Tests Ordered: Current medicines are reviewed at length with the patient today.  Concerns regarding medicines are outlined above.  Medication changes, Labs and Tests ordered today are listed in the Patient Instructions below. Patient Instructions  Medication Instructions:  Your physician recommends that you continue on your current medications as directed. Please refer to the Current Medication list given to you today.   Labwork: TODAY: BMET  Testing/Procedures: None ordered  Follow-Up: Your physician wants you to follow-up  WITH DR. VARANASI NEXT AVAILABLE .    Any Other Special Instructions Will Be Listed Below (If Applicable).     If you need a refill on your cardiac medications before your next appointment, please call your pharmacy.      Signed, Jacolyn ReedyMichele Blimie Vaness, PA-C  11/11/2017 3:05 PM    [facility] [address], [place], [place]  [address] Phone: [phone]; Fax: [phone]

## 2017-11-11 NOTE — Patient Instructions (Addendum)
Medication Instructions:  Your physician recommends that you continue on your current medications as directed. Please refer to the Current Medication list given to you today.   Labwork: TODAY: BMET  Testing/Procedures: None ordered  Follow-Up: Your physician wants you to follow-up  WITH DR. VARANASI NEXT AVAILABLE .    Any Other Special Instructions Will Be Listed Below (If Applicable).     If you need a refill on your cardiac medications before your next appointment, please call your pharmacy.

## 2017-11-11 NOTE — Patient Instructions (Addendum)
Continue warfarin daily  and lipitor  for secondary stroke prevention  Continue to follow up with PCP regarding cholesterol and blood pressure management   Continue home PT - if additional orders are needed, please call us to obtain additional orders  Highly recommended independent or assisted living facility do to safety concerns with weakness due to stroke and deconditioning   Continue to monitor blood pressure at home  Maintain strict control of hypertension with blood pressure goal below 130/90, diabetes with hemoglobin A1c goal below 6.5% and cholesterol with LDL cholesterol (bad cholesterol) goal below 70 mg/dL. I also advised the patient to eat a healthy diet with plenty of whole grains, cereals, fruits and vegetables, exercise regularly and maintain ideal body weight.  Followup in the future with me in 3 months or call earlier if needed       Thank you for coming to see [facility] at [facility]. I hope we have been able to provide you high quality care today.  You may receive a patient satisfaction survey over the next few weeks. We would appreciate your feedback and comments so that we may continue to improve ourselves and the health of our patients.

## 2017-11-11 NOTE — Progress Notes (Signed)
I agree with the above plan 

## 2017-11-11 NOTE — Patient Instructions (Signed)
Description   Today take 2.5 tablets, then change your dose to 2 tablets every day except 2.5 tablets on Tuesdays.  Recheck in 10 days.  Pt Is doing 1 bottle of Equate daily and will continue eating 2-3 serving of leafy green vegetable weekly.  Call with any problems or if ordered any new medications. Coumadin Clinic [phone]

## 2017-11-12 ENCOUNTER — Telehealth: Payer: Self-pay | Admitting: *Deleted

## 2017-11-12 ENCOUNTER — Ambulatory Visit
Admission: RE | Admit: 2017-11-12 | Discharge: 2017-11-12 | Disposition: A | Discharge status: Home / Self Care | Payer: Medicare Other | Source: Ambulatory Visit | Attending: Internal Medicine | Admitting: Internal Medicine

## 2017-11-12 ENCOUNTER — Other Ambulatory Visit: Payer: Self-pay | Admitting: Internal Medicine

## 2017-11-12 DIAGNOSIS — R7989 Other specified abnormal findings of blood chemistry: Secondary | ICD-10-CM

## 2017-11-12 DIAGNOSIS — Z09 Encounter for follow-up examination after completed treatment for conditions other than malignant neoplasm: Secondary | ICD-10-CM

## 2017-11-12 DIAGNOSIS — I10 Essential (primary) hypertension: Secondary | ICD-10-CM

## 2017-11-12 DIAGNOSIS — R7982 Elevated C-reactive protein (CRP): Secondary | ICD-10-CM

## 2017-11-12 LAB — BASIC METABOLIC PANEL
BUN/Creatinine Ratio: 18 (ref 12–28)
BUN: 31 mg/dL (ref 10–36)
CALCIUM: 9.9 mg/dL (ref 8.7–10.3)
CO2: 22 mmol/L (ref 20–29)
CREATININE: 1.74 mg/dL — AB (ref 0.57–1.00)
Chloride: 98 mmol/L (ref 96–106)
GFR, EST AFRICAN AMERICAN: 28 mL/min/{1.73_m2} — AB (ref >59)
GFR, EST NON AFRICAN AMERICAN: 24 mL/min/{1.73_m2} — AB (ref >59)
Glucose: 128 mg/dL — ABNORMAL HIGH (ref 65–99)
POTASSIUM: 4.4 mmol/L (ref 3.5–5.2)
Sodium: 138 mmol/L (ref 134–144)

## 2017-11-12 MED ORDER — FUROSEMIDE 40 MG PO TABS: 60.0000 mg | ORAL_TABLET | Freq: Every day | ORAL | 3 refills | Status: DC

## 2017-11-12 MED ORDER — METOPROLOL TARTRATE 25 MG PO TABS: 12.5000 mg | ORAL_TABLET | Freq: Every day | ORAL | 1 refills | Status: AC

## 2017-11-12 NOTE — Telephone Encounter (Signed)
Attempted to contact Tonya Farley at [phone] but there was no answer and the VM was not set up. Will try again later.

## 2017-11-12 NOTE — Telephone Encounter (Signed)
Patient's niece Darel HongJudy Lopp calling back (DPR on file). Darel HongJudy states that she wanted to make sure that we knew that she was not upset with our practice at all. She states that we have an excellent practice. She states that she was upset with the care that she received in the hospital and in the nursing home. She states that she was initially upset this morning when she called over to [facility] and she was told by the pharmacist that there were no refills there. She states that she called back a little later and they had a refill for her spironolactone that was sent yesterday. She states that she flew off the handle a little quick this morning but she wants to reiterate that she is not upset with anyone in our practice.   Darel HongJudy did want to clarify if the patient should be taking Toprol or Lopressor. Per Jacolyn ReedyMichele Lenze, PA patient is to continue taking her meds as she has been since patient was doing well on exam yesterday. Darel HongJudy states that the patient has only been taking lopressor 12.5 mg QD. Verified two more times that the patient has only been taking lopressor 12.5 mg QD. HR was 60 at OV yesterday. Instructed for the patient to continue taking lopressor 12.5 mg QD as she has been and let us know if the patient's Sx change or worsen. Toprol removed from patient's med list and refill for lopressor sent to preferred pharmacy.  Darel HongJudy also wanted to review instructions given earlier for her lab results. Reviewed instructions and let her know the patient should decrease lasix to 60 mg daily. Made her aware that Rx was sent for lasix 40 mg tablet: take 1.5 tablets once a day. Made her aware that the patient can take and extra 1/2 tablet (20 mg total) for weight gain of 2-3 lbs overnight or increased swelling. Patient's lab appointment for BMET on 9/10. Darel HongJudy verbalized understanding to all instructions. She denied additional questions or concerns at this time and wanted to thank everyone involved in her care at our office.

## 2017-11-12 NOTE — Telephone Encounter (Signed)
-----  Message from Imogene Burn, PA-C sent at 11/12/2017  8:57 AM EDT ----- Kidney function up a little.  Try to reduce Lasix to 60 mg daily which would be 1-1/2 tablets of the 40 mg.  Can take an extra half of Lasix for weight gain of 2 or 3 pounds overnight or swelling.  Repeat be met in 3 weeks

## 2017-11-12 NOTE — Telephone Encounter (Signed)
Patients niece, Darel HongJudy called and was extremely upset about yesterdays office visit with Herma CarsonMichelle Lenze. She is requesting a call back at [phone] as she stated that med changes were made but the pharmacy didn't receive any prescriptions. I informed her that I could send the rx's in but she states that she doesn't know what the changes were and requested to speak with the nurse that was supposed to handle this yesterday. I made her aware that I would send a msg and have a nurse call her back. She kept saying that she really only wants to talk to the staff that was responsible for "dropping the ball yesterday" and requested that I provide her with the name of the individual. She asked me if she should just come to the office and then maybe she can speak with the nurse that was in the room yesterday in person. She then states that she thought that she was working with a group of professionals but evidently not because how can something like this be missed and were the new orders supposed to just "fall from the sky"?. She requested the spelling of the providers name that she saw yesterday and she again requested the name of the nurse that didn't do her job and should be reprimanded for this. She went on to say that she is not in kindergarten and should not have had to ask for names yesterday. She proceeded to hang up on me.

## 2017-11-20 ENCOUNTER — Ambulatory Visit (INDEPENDENT_AMBULATORY_CARE_PROVIDER_SITE_OTHER): Payer: Medicare Other | Admitting: *Deleted

## 2017-11-20 DIAGNOSIS — Z5181 Encounter for therapeutic drug level monitoring: Secondary | ICD-10-CM | POA: Diagnosis not present

## 2017-11-20 DIAGNOSIS — I4891 Unspecified atrial fibrillation: Secondary | ICD-10-CM

## 2017-11-20 LAB — POCT INR: INR: 2.8 (ref 2.0–3.0)

## 2017-11-20 NOTE — Patient Instructions (Signed)
Description   Continue taking  2 tablets every day except 2.5 tablets on Tuesdays.  Recheck in 2 weeks.   Pt Is doing 1 bottle of Equate daily and will continue eating 2-3 serving of leafy green vegetable weekly.  Call with any problems or if ordered any new medications. Coumadin Clinic [phone]

## 2017-12-02 ENCOUNTER — Other Ambulatory Visit: Payer: Medicare Other

## 2017-12-02 ENCOUNTER — Ambulatory Visit (INDEPENDENT_AMBULATORY_CARE_PROVIDER_SITE_OTHER): Payer: Medicare Other

## 2017-12-02 DIAGNOSIS — I4891 Unspecified atrial fibrillation: Secondary | ICD-10-CM

## 2017-12-02 DIAGNOSIS — I10 Essential (primary) hypertension: Secondary | ICD-10-CM

## 2017-12-02 DIAGNOSIS — Z5181 Encounter for therapeutic drug level monitoring: Secondary | ICD-10-CM

## 2017-12-02 DIAGNOSIS — R7989 Other specified abnormal findings of blood chemistry: Secondary | ICD-10-CM

## 2017-12-02 LAB — POCT INR: INR: 4.1 — AB (ref 2.0–3.0)

## 2017-12-02 NOTE — Patient Instructions (Signed)
Please skip coumadin today and tomorrow, then continue taking  2 tablets every day except 2.5 tablets on Tuesdays.  Recheck in 2 weeks.   Pt Is doing 1 bottle of Equate daily and will continue eating 2-3 serving of leafy green vegetable weekly.  Call with any problems or if ordered any new medications. Coumadin Clinic [phone]

## 2017-12-03 ENCOUNTER — Telehealth: Payer: Self-pay

## 2017-12-03 DIAGNOSIS — E876 Hypokalemia: Secondary | ICD-10-CM

## 2017-12-03 LAB — BASIC METABOLIC PANEL
BUN/Creatinine Ratio: 17 (ref 12–28)
BUN: 25 mg/dL (ref 10–36)
CO2: 25 mmol/L (ref 20–29)
Calcium: 9.4 mg/dL (ref 8.7–10.3)
Chloride: 101 mmol/L (ref 96–106)
Creatinine, Ser: 1.51 mg/dL — ABNORMAL HIGH (ref 0.57–1.00)
GFR, EST AFRICAN AMERICAN: 33 mL/min/{1.73_m2} — AB (ref >59)
GFR, EST NON AFRICAN AMERICAN: 29 mL/min/{1.73_m2} — AB (ref >59)
Glucose: 136 mg/dL — ABNORMAL HIGH (ref 65–99)
POTASSIUM: 3.3 mmol/L — AB (ref 3.5–5.2)
SODIUM: 142 mmol/L (ref 134–144)

## 2017-12-03 MED ORDER — POTASSIUM CHLORIDE CRYS ER 20 MEQ PO TBCR: 20.0000 meq | EXTENDED_RELEASE_TABLET | Freq: Every day | ORAL | 3 refills | Status: AC

## 2017-12-03 NOTE — Telephone Encounter (Signed)
Called and made patient and niece Darel Hong (DPR on file) aware of results and recommendations to continue lasix 60 mg QD and to take potassium 20 mEq tablet- 2 today, 2 tomorrow, and 1 whole tablet once a day everyday thereafter. Repeat BMETscheduled for 10/2. ------

## 2017-12-03 NOTE — Telephone Encounter (Signed)
-----  Message from Imogene Burn, PA-C sent at 12/03/2017  7:48 AM EDT ----- Kidney function came down some.  Continue Lasix 60 mg daily.  Potassium low.  Take K. Dur 20 mEq- 2 today, 2 tomorrow then 1 whole tablet daily.  Repeat be met in 3 weeks.

## 2017-12-09 ENCOUNTER — Ambulatory Visit (INDEPENDENT_AMBULATORY_CARE_PROVIDER_SITE_OTHER): Payer: Medicare Other

## 2017-12-09 DIAGNOSIS — I4891 Unspecified atrial fibrillation: Secondary | ICD-10-CM | POA: Diagnosis not present

## 2017-12-09 DIAGNOSIS — Z5181 Encounter for therapeutic drug level monitoring: Secondary | ICD-10-CM

## 2017-12-09 LAB — POCT INR: INR: 1.7 — AB (ref 2.0–3.0)

## 2017-12-09 NOTE — Patient Instructions (Signed)
Please take 3 tablets tonight, then continue taking  2 tablets every day except 2.5 tablets on Tuesdays.  Recheck in 2 weeks.   Pt Is doing 1 bottle of Equate daily and will continue eating 2-3 serving of leafy green vegetable weekly.  Call with any problems or if ordered any new medications. Coumadin Clinic [phone]

## 2017-12-17 ENCOUNTER — Ambulatory Visit: Payer: Medicare Other | Admitting: Nurse Practitioner

## 2017-12-24 ENCOUNTER — Other Ambulatory Visit: Payer: Medicare Other | Admitting: *Deleted

## 2017-12-24 ENCOUNTER — Ambulatory Visit (INDEPENDENT_AMBULATORY_CARE_PROVIDER_SITE_OTHER): Payer: Medicare Other | Admitting: *Deleted

## 2017-12-24 DIAGNOSIS — I4891 Unspecified atrial fibrillation: Secondary | ICD-10-CM

## 2017-12-24 DIAGNOSIS — Z5181 Encounter for therapeutic drug level monitoring: Secondary | ICD-10-CM

## 2017-12-24 DIAGNOSIS — E876 Hypokalemia: Secondary | ICD-10-CM

## 2017-12-24 LAB — POCT INR: INR: 2.2 (ref 2.0–3.0)

## 2017-12-24 NOTE — Patient Instructions (Signed)
Description   Continue taking  2 tablets every day except 2.5 tablets on Tuesdays.  Recheck in 3 weeks.   Pt Is doing 1 bottle of Equate daily and will continue eating 2-3 serving of leafy green vegetable weekly.  Call with any problems or if ordered any new medications. Coumadin Clinic [phone]

## 2017-12-25 LAB — BASIC METABOLIC PANEL
BUN / CREAT RATIO: 19 (ref 12–28)
BUN: 31 mg/dL (ref 10–36)
CHLORIDE: 101 mmol/L (ref 96–106)
CO2: 25 mmol/L (ref 20–29)
Calcium: 9.6 mg/dL (ref 8.7–10.3)
Creatinine, Ser: 1.67 mg/dL — ABNORMAL HIGH (ref 0.57–1.00)
GFR calc non Af Amer: 26 mL/min/{1.73_m2} — ABNORMAL LOW (ref >59)
GFR, EST AFRICAN AMERICAN: 30 mL/min/{1.73_m2} — AB (ref >59)
Glucose: 97 mg/dL (ref 65–99)
POTASSIUM: 3.9 mmol/L (ref 3.5–5.2)
Sodium: 143 mmol/L (ref 134–144)

## 2018-01-05 MED ORDER — FUROSEMIDE 40 MG PO TABS: 60.0000 mg | ORAL_TABLET | Freq: Every day | ORAL | 0 refills | Status: DC

## 2018-01-06 ENCOUNTER — Other Ambulatory Visit: Payer: Self-pay | Admitting: Physician Assistant

## 2018-01-06 MED ORDER — FUROSEMIDE 40 MG PO TABS: 60.0000 mg | ORAL_TABLET | Freq: Every day | ORAL | 0 refills | Status: DC

## 2018-01-06 NOTE — Telephone Encounter (Signed)
Patients niece called and requested a refill on furosemide for the patient. She stated that she was told yesterday that this was sent to the pharmacy but when she went to pick it up, she was informed that they never received an rx. She is requesting a thirty day refill as patient has an upcoming appointment and she is unsure if dose will be changed. I made her aware that I would send this in as requested. She verbalized her understanding and appreciation.

## 2018-01-08 ENCOUNTER — Telehealth: Payer: Self-pay

## 2018-01-08 MED ORDER — FUROSEMIDE 40 MG PO TABS: 40.0000 mg | ORAL_TABLET | Freq: Every day | ORAL | 0 refills | Status: AC

## 2018-01-08 NOTE — Telephone Encounter (Signed)
-----   Message from Dyann Kief, [place] sent at 12/25/2017  7:46 AM EDT ----- Kidney function up a bit but potassium normal. Ask her how her breathing and edema  And weights are? Would she tolerate lowering lasix to 40 mg daily? If not, continue 60 mg daily and repeat bmet at ov with Dr. Eldridge Dace 11/1

## 2018-01-08 NOTE — Telephone Encounter (Signed)
Notes recorded by Daleen Bo I, RN on 01/01/2018 at 3:44 PM EDT Left message for patient to call back. ------  Notes recorded by Daleen Bo I, RN on 12/30/2017 at 1:13 PM EDT Left message for patient to call back. ------  Notes recorded by Henrietta Dine, RN on 12/25/2017 at 12:45 PM EDT Left message to call back. ------  Notes recorded by Dyann Kief, PA-C on 12/25/2017 at 7:46 AM EDT Kidney function up a bit but potassium normal. Ask her how her breathing and edema And weights are? Would she tolerate lowering lasix to 40 mg daily? If not, continue 60 mg daily and repeat bmet at ov with Dr. Eldridge Dace 11/1

## 2018-01-08 NOTE — Telephone Encounter (Signed)
Called and spoke to patient. Made her aware of lab results. She states that her breathing is fine. She denies SOB, LEE, or weight gain. Made patient aware that we could decrease her lasix to 40 mg QD since she has been asymptomatic. Patient states that she believes that her other doctor just decreased her to 1 tablet daily (40 mg). Patient requesting that I contact Janie just to be sure. Made patient aware that I would reach out to [facility]. Instructed for patient to let us know if she developed any SOB, LEE, or weight gain. Patient verbalized understanding and thanked me for the call.

## 2018-01-08 NOTE — Telephone Encounter (Signed)
Left message for Tonya Farley to call back.

## 2018-01-13 NOTE — Telephone Encounter (Signed)
Late entry:  Spoke to [place] on 10/17. Made her aware of info below. Patient will take lasix 40 mg QD and keep appointment with Dr. Eldridge Dace on 11/1.

## 2018-01-14 ENCOUNTER — Ambulatory Visit (INDEPENDENT_AMBULATORY_CARE_PROVIDER_SITE_OTHER): Payer: Medicare Other | Admitting: *Deleted

## 2018-01-14 DIAGNOSIS — Z5181 Encounter for therapeutic drug level monitoring: Secondary | ICD-10-CM | POA: Diagnosis not present

## 2018-01-14 DIAGNOSIS — I4891 Unspecified atrial fibrillation: Secondary | ICD-10-CM

## 2018-01-14 LAB — POCT INR: INR: 1.8 — AB (ref 2.0–3.0)

## 2018-01-14 NOTE — Patient Instructions (Signed)
Description   Today take 2.5 tablets, then continue taking  2 tablets every day except 2.5 tablets on Tuesdays.  Recheck in 2 weeks.   Pt Is doing 1 bottle of Equate daily and will continue eating 2-3 serving of leafy green vegetable weekly.  Call with any problems or if ordered any new medications. [facility] [phone]

## 2018-01-22 ENCOUNTER — Encounter (HOSPITAL_COMMUNITY): Payer: Self-pay | Admitting: Internal Medicine

## 2018-01-22 ENCOUNTER — Inpatient Hospital Stay (HOSPITAL_COMMUNITY)
Admission: EM | Admit: 2018-01-22 | Discharge: 2018-02-22 | DRG: 469 | Disposition: E | Payer: Medicare Other | Attending: Pulmonary Disease | Admitting: Pulmonary Disease

## 2018-01-22 ENCOUNTER — Emergency Department (HOSPITAL_COMMUNITY): Payer: Medicare Other

## 2018-01-22 DIAGNOSIS — Z8249 Family history of ischemic heart disease and other diseases of the circulatory system: Secondary | ICD-10-CM

## 2018-01-22 DIAGNOSIS — I444 Left anterior fascicular block: Secondary | ICD-10-CM | POA: Diagnosis present

## 2018-01-22 DIAGNOSIS — I5082 Biventricular heart failure: Secondary | ICD-10-CM | POA: Diagnosis present

## 2018-01-22 DIAGNOSIS — E039 Hypothyroidism, unspecified: Secondary | ICD-10-CM | POA: Diagnosis present

## 2018-01-22 DIAGNOSIS — I4891 Unspecified atrial fibrillation: Secondary | ICD-10-CM | POA: Diagnosis present

## 2018-01-22 DIAGNOSIS — I482 Chronic atrial fibrillation, unspecified: Secondary | ICD-10-CM | POA: Diagnosis present

## 2018-01-22 DIAGNOSIS — Z66 Do not resuscitate: Secondary | ICD-10-CM | POA: Diagnosis present

## 2018-01-22 DIAGNOSIS — R570 Cardiogenic shock: Secondary | ICD-10-CM | POA: Diagnosis not present

## 2018-01-22 DIAGNOSIS — I5023 Acute on chronic systolic (congestive) heart failure: Secondary | ICD-10-CM | POA: Diagnosis not present

## 2018-01-22 DIAGNOSIS — E872 Acidosis: Secondary | ICD-10-CM | POA: Diagnosis not present

## 2018-01-22 DIAGNOSIS — W010XXA Fall on same level from slipping, tripping and stumbling without subsequent striking against object, initial encounter: Secondary | ICD-10-CM | POA: Diagnosis present

## 2018-01-22 DIAGNOSIS — Z6826 Body mass index (BMI) 26.0-26.9, adult: Secondary | ICD-10-CM | POA: Diagnosis not present

## 2018-01-22 DIAGNOSIS — E43 Unspecified severe protein-calorie malnutrition: Secondary | ICD-10-CM | POA: Diagnosis present

## 2018-01-22 DIAGNOSIS — Z880 Allergy status to penicillin: Secondary | ICD-10-CM

## 2018-01-22 DIAGNOSIS — Z9841 Cataract extraction status, right eye: Secondary | ICD-10-CM

## 2018-01-22 DIAGNOSIS — Z9089 Acquired absence of other organs: Secondary | ICD-10-CM

## 2018-01-22 DIAGNOSIS — D649 Anemia, unspecified: Secondary | ICD-10-CM | POA: Diagnosis present

## 2018-01-22 DIAGNOSIS — I9581 Postprocedural hypotension: Secondary | ICD-10-CM | POA: Diagnosis not present

## 2018-01-22 DIAGNOSIS — S72011A Unspecified intracapsular fracture of right femur, initial encounter for closed fracture: Principal | ICD-10-CM | POA: Diagnosis present

## 2018-01-22 DIAGNOSIS — N183 Chronic kidney disease, stage 3 unspecified: Secondary | ICD-10-CM | POA: Diagnosis present

## 2018-01-22 DIAGNOSIS — I4581 Long QT syndrome: Secondary | ICD-10-CM | POA: Diagnosis present

## 2018-01-22 DIAGNOSIS — I081 Rheumatic disorders of both mitral and tricuspid valves: Secondary | ICD-10-CM | POA: Diagnosis present

## 2018-01-22 DIAGNOSIS — Z8673 Personal history of transient ischemic attack (TIA), and cerebral infarction without residual deficits: Secondary | ICD-10-CM

## 2018-01-22 DIAGNOSIS — Y92099 Unspecified place in other non-institutional residence as the place of occurrence of the external cause: Secondary | ICD-10-CM | POA: Diagnosis not present

## 2018-01-22 DIAGNOSIS — S72009A Fracture of unspecified part of neck of unspecified femur, initial encounter for closed fracture: Secondary | ICD-10-CM | POA: Diagnosis present

## 2018-01-22 DIAGNOSIS — Z7901 Long term (current) use of anticoagulants: Secondary | ICD-10-CM

## 2018-01-22 DIAGNOSIS — I5022 Chronic systolic (congestive) heart failure: Secondary | ICD-10-CM | POA: Diagnosis not present

## 2018-01-22 DIAGNOSIS — Z79890 Hormone replacement therapy (postmenopausal): Secondary | ICD-10-CM

## 2018-01-22 DIAGNOSIS — Z8709 Personal history of other diseases of the respiratory system: Secondary | ICD-10-CM

## 2018-01-22 DIAGNOSIS — Z9049 Acquired absence of other specified parts of digestive tract: Secondary | ICD-10-CM

## 2018-01-22 DIAGNOSIS — I469 Cardiac arrest, cause unspecified: Secondary | ICD-10-CM | POA: Diagnosis not present

## 2018-01-22 DIAGNOSIS — I97121 Postprocedural cardiac arrest following other surgery: Secondary | ICD-10-CM | POA: Diagnosis not present

## 2018-01-22 DIAGNOSIS — I272 Pulmonary hypertension, unspecified: Secondary | ICD-10-CM | POA: Diagnosis present

## 2018-01-22 DIAGNOSIS — E785 Hyperlipidemia, unspecified: Secondary | ICD-10-CM | POA: Diagnosis present

## 2018-01-22 DIAGNOSIS — D696 Thrombocytopenia, unspecified: Secondary | ICD-10-CM | POA: Diagnosis present

## 2018-01-22 DIAGNOSIS — R34 Anuria and oliguria: Secondary | ICD-10-CM | POA: Diagnosis not present

## 2018-01-22 DIAGNOSIS — I10 Essential (primary) hypertension: Secondary | ICD-10-CM | POA: Diagnosis present

## 2018-01-22 DIAGNOSIS — Z96649 Presence of unspecified artificial hip joint: Secondary | ICD-10-CM

## 2018-01-22 DIAGNOSIS — E875 Hyperkalemia: Secondary | ICD-10-CM | POA: Diagnosis not present

## 2018-01-22 DIAGNOSIS — Z882 Allergy status to sulfonamides status: Secondary | ICD-10-CM

## 2018-01-22 DIAGNOSIS — Z9842 Cataract extraction status, left eye: Secondary | ICD-10-CM

## 2018-01-22 DIAGNOSIS — S72001A Fracture of unspecified part of neck of right femur, initial encounter for closed fracture: Secondary | ICD-10-CM

## 2018-01-22 DIAGNOSIS — Z419 Encounter for procedure for purposes other than remedying health state, unspecified: Secondary | ICD-10-CM

## 2018-01-22 DIAGNOSIS — Z961 Presence of intraocular lens: Secondary | ICD-10-CM | POA: Diagnosis present

## 2018-01-22 DIAGNOSIS — I130 Hypertensive heart and chronic kidney disease with heart failure and stage 1 through stage 4 chronic kidney disease, or unspecified chronic kidney disease: Secondary | ICD-10-CM | POA: Diagnosis present

## 2018-01-22 DIAGNOSIS — N184 Chronic kidney disease, stage 4 (severe): Secondary | ICD-10-CM | POA: Diagnosis present

## 2018-01-22 DIAGNOSIS — Z978 Presence of other specified devices: Secondary | ICD-10-CM

## 2018-01-22 DIAGNOSIS — Z79899 Other long term (current) drug therapy: Secondary | ICD-10-CM

## 2018-01-22 DIAGNOSIS — J9601 Acute respiratory failure with hypoxia: Secondary | ICD-10-CM | POA: Diagnosis not present

## 2018-01-22 DIAGNOSIS — Z4659 Encounter for fitting and adjustment of other gastrointestinal appliance and device: Secondary | ICD-10-CM

## 2018-01-22 DIAGNOSIS — Z9911 Dependence on respirator [ventilator] status: Secondary | ICD-10-CM | POA: Diagnosis not present

## 2018-01-22 DIAGNOSIS — Z91013 Allergy to seafood: Secondary | ICD-10-CM

## 2018-01-22 DIAGNOSIS — Z7951 Long term (current) use of inhaled steroids: Secondary | ICD-10-CM

## 2018-01-22 DIAGNOSIS — M25551 Pain in right hip: Secondary | ICD-10-CM | POA: Diagnosis present

## 2018-01-22 LAB — PROTIME-INR
INR: 1.51
Prothrombin Time: 18 seconds — ABNORMAL HIGH (ref 11.4–15.2)

## 2018-01-22 LAB — CBC WITH DIFFERENTIAL/PLATELET
Abs Immature Granulocytes: 0.14 10*3/uL — ABNORMAL HIGH (ref 0.00–0.07)
Basophils Absolute: 0 10*3/uL (ref 0.0–0.1)
Basophils Relative: 0 %
Eosinophils Absolute: 0 10*3/uL (ref 0.0–0.5)
Eosinophils Relative: 0 %
HCT: 41.5 % (ref 36.0–46.0)
Hemoglobin: 12.1 g/dL (ref 12.0–15.0)
Immature Granulocytes: 1 %
Lymphocytes Relative: 6 %
Lymphs Abs: 0.6 10*3/uL — ABNORMAL LOW (ref 0.7–4.0)
MCH: 26.1 pg (ref 26.0–34.0)
MCHC: 29.2 g/dL — ABNORMAL LOW (ref 30.0–36.0)
MCV: 89.6 fL (ref 80.0–100.0)
Monocytes Absolute: 1.2 10*3/uL — ABNORMAL HIGH (ref 0.1–1.0)
Monocytes Relative: 11 %
Neutro Abs: 8.9 10*3/uL — ABNORMAL HIGH (ref 1.7–7.7)
Neutrophils Relative %: 82 %
Platelets: 190 10*3/uL (ref 150–400)
RBC: 4.63 MIL/uL (ref 3.87–5.11)
RDW: 17.2 % — ABNORMAL HIGH (ref 11.5–15.5)
WBC: 10.9 10*3/uL — ABNORMAL HIGH (ref 4.0–10.5)
nRBC: 0 % (ref 0.0–0.2)

## 2018-01-22 LAB — BASIC METABOLIC PANEL
Anion gap: 15 (ref 5–15)
BUN: 27 mg/dL — ABNORMAL HIGH (ref 8–23)
CO2: 25 mmol/L (ref 22–32)
Calcium: 9.5 mg/dL (ref 8.9–10.3)
Chloride: 100 mmol/L (ref 98–111)
Creatinine, Ser: 1.51 mg/dL — ABNORMAL HIGH (ref 0.44–1.00)
GFR calc Af Amer: 32 mL/min — ABNORMAL LOW (ref >60)
GFR calc non Af Amer: 28 mL/min — ABNORMAL LOW (ref >60)
Glucose, Bld: 104 mg/dL — ABNORMAL HIGH (ref 70–99)
Potassium: 3.9 mmol/L (ref 3.5–5.1)
Sodium: 140 mmol/L (ref 135–145)

## 2018-01-22 MED ORDER — BRIMONIDINE TARTRATE 0.15 % OP SOLN
1.0000 [drp] | Freq: Three times a day (TID) | OPHTHALMIC | Status: DC
  Administered 2018-01-23 – 2018-01-24 (×5): 1 [drp] via OPHTHALMIC
  Filled 2018-01-22: qty 5

## 2018-01-22 MED ORDER — PHYTONADIONE 5 MG PO TABS
5.0000 mg | ORAL_TABLET | Freq: Once | ORAL | Status: AC
  Administered 2018-01-22: 5 mg via ORAL
  Filled 2018-01-22: qty 1

## 2018-01-22 MED ORDER — MOMETASONE FURO-FORMOTEROL FUM 100-5 MCG/ACT IN AERO
2.0000 | INHALATION_SPRAY | Freq: Two times a day (BID) | RESPIRATORY_TRACT | Status: DC
  Filled 2018-01-22: qty 8.8

## 2018-01-22 MED ORDER — METOPROLOL TARTRATE 12.5 MG HALF TABLET
12.5000 mg | ORAL_TABLET | Freq: Every day | ORAL | Status: DC
  Administered 2018-01-22 – 2018-01-23 (×2): 12.5 mg via ORAL
  Filled 2018-01-22 (×2): qty 1

## 2018-01-22 MED ORDER — LATANOPROST 0.005 % OP SOLN
1.0000 [drp] | Freq: Every day | OPHTHALMIC | Status: DC
  Administered 2018-01-23 – 2018-01-24 (×3): 1 [drp] via OPHTHALMIC
  Filled 2018-01-22: qty 2.5

## 2018-01-22 MED ORDER — ISOSORBIDE MONONITRATE ER 30 MG PO TB24: 30.0000 mg | ORAL_TABLET | Freq: Every day | ORAL | Status: DC

## 2018-01-22 MED ORDER — MORPHINE SULFATE (PF) 2 MG/ML IV SOLN
2.0000 mg | INTRAVENOUS | Status: DC | PRN
  Administered 2018-01-22: 2 mg via INTRAVENOUS
  Filled 2018-01-22: qty 1

## 2018-01-22 MED ORDER — MORPHINE SULFATE (PF) 2 MG/ML IV SOLN: 0.5000 mg | INTRAVENOUS | Status: DC | PRN

## 2018-01-22 MED ORDER — DILTIAZEM HCL 25 MG/5ML IV SOLN
10.0000 mg | Freq: Once | INTRAVENOUS | Status: AC
  Administered 2018-01-22: 10 mg via INTRAVENOUS
  Filled 2018-01-22: qty 5

## 2018-01-22 MED ORDER — SODIUM CHLORIDE 0.9 % IV BOLUS
250.0000 mL | Freq: Once | INTRAVENOUS | Status: AC
  Administered 2018-01-22: 250 mL via INTRAVENOUS

## 2018-01-22 MED ORDER — LEVOTHYROXINE SODIUM 88 MCG PO TABS
88.0000 ug | ORAL_TABLET | Freq: Every day | ORAL | Status: DC
  Administered 2018-01-24: 88 ug via ORAL
  Filled 2018-01-22 (×3): qty 1

## 2018-01-22 MED ORDER — HYDROCODONE-ACETAMINOPHEN 5-325 MG PO TABS
1.0000 | ORAL_TABLET | Freq: Four times a day (QID) | ORAL | Status: DC | PRN
  Administered 2018-01-22: 1 via ORAL
  Filled 2018-01-22: qty 1

## 2018-01-22 MED ORDER — DILTIAZEM HCL ER COATED BEADS 120 MG PO CP24
120.0000 mg | ORAL_CAPSULE | Freq: Every day | ORAL | Status: DC
  Filled 2018-01-22: qty 1

## 2018-01-22 MED ORDER — TIMOLOL MALEATE 0.5 % OP SOLN
1.0000 [drp] | Freq: Every day | OPHTHALMIC | Status: DC
  Administered 2018-01-23 – 2018-01-24 (×2): 1 [drp] via OPHTHALMIC
  Filled 2018-01-22 (×2): qty 5

## 2018-01-22 MED ORDER — ATORVASTATIN CALCIUM 10 MG PO TABS
10.0000 mg | ORAL_TABLET | Freq: Every day | ORAL | Status: DC
  Administered 2018-01-22: 10 mg via ORAL
  Filled 2018-01-22 (×2): qty 1

## 2018-01-22 NOTE — H&P (View-Only) (Signed)
Reason for Consult:Right hip fx Referring Physician: S Kohut  Tonya Farley is an 84 y.o. female.  HPI: Tonya Farley was at lunch and her RW was a little too far away. When she got up and tried to get to it she lost her balance and fell. She had immediate right hip pain and could not get up. She came to the ED and x-rays showed a right femoral neck fx and orthopedic surgery was consulted. She c/o localized pain to the area. She lives at [facility].  Past Medical History:  Diagnosis Date  . Chronic a-fib   . Chronic back pain   . Chronic systolic (congestive) heart failure (HCC)    reduced EF since 2018 to 35-40%  . CKD (chronic kidney disease) stage 4, GFR 15-29 ml/min (HCC) 09/11/2015  . Hyperlipidemia   . Hypertension   . Hypothyroidism   . Pulmonary hypertension (HCC)    PASP 65 mmHg at Echo 3/17  . Stroke (HCC)   . Thyroid disease     Past Surgical History:  Procedure Laterality Date  . APPENDECTOMY    . cataracts     bilateral  . EYE SURGERY     left eye "hole"  . IR KYPHO THORACIC WITH BONE BIOPSY  09/26/2017  . IR RADIOLOGIST EVAL & MGMT  09/18/2017  . TONSILLECTOMY      Family History  Problem Relation Age of Onset  . Heart disease Sister   . Heart disease Sister   . Cancer Mother   . Other Father   . Heart attack Father     Social History:  reports that she has never smoked. She has never used smokeless tobacco. She reports that she does not drink alcohol or use drugs.  Allergies:  Allergies  Allergen Reactions  . Penicillins Hives and Swelling    Has patient had a PCN reaction causing immediate rash, facial/tongue/throat swelling, SOB or lightheadedness with hypotension: Yes Has patient had a PCN reaction causing severe rash involving mucus membranes or skin necrosis: Yes Has patient had a PCN reaction that required hospitalization No Has patient had a PCN reaction occurring within the last 10 years: No If all of the above answers are "NO", then may  proceed with Cephalosporin use.    . Sulfa Antibiotics Anaphylaxis and Swelling  . Fish Allergy Nausea And Vomiting    Medications: I have reviewed the patient's current medications.  No results found for this or any previous visit (from the past 48 hour(s)).  Dg Chest 1 View  Result Date: 01/14/2018 CLINICAL DATA:  Recent fall today with right hip pain, initial encounter EXAM: CHEST  1 VIEW COMPARISON:  11/12/2017 FINDINGS: Cardiac shadow is enlarged. Aortic calcifications are again seen. Some chronic appearing changes in the left base are noted with mild scarring and blunting of left costophrenic angle. No acute focal infiltrate is noted. Mild elevation of the left hemidiaphragm is noted. IMPRESSION: Chronic changes in the left base.  No acute abnormality is noted. Electronically Signed   By: Mark  Lukens M.D.   On: 01/05/2018 16:03   Dg Hip Unilat W Or Wo Pelvis 2-3 Views Right  Result Date: 12/27/2017 CLINICAL DATA:  Fall, RIGHT hip pain. EXAM: DG HIP (WITH OR WITHOUT PELVIS) 2-3V RIGHT COMPARISON:  None. FINDINGS: Displaced fracture of the RIGHT femoral neck, partially obscured by the overlying greater trochanter, most likely subcapital region. Femoral head appears normally positioned relative to the acetabulum. Osseous structures of the pelvis are diffusely osteopenic limiting   characterization of osseous detail, however, no additional fracture line or displaced fracture fragment is identified. Sacrum is partially obscured by overlying bowel gas. Extensive vascular calcifications within the pelvis and upper thighs. Soft tissues about the pelvis and RIGHT hip are otherwise unremarkable. IMPRESSION: 1. Displaced fracture of the RIGHT femoral neck, partially obscured by overlying osseous structures, most likely confined to the subcapital region. 2. Osteopenia. 3. Extensive vascular calcifications. Electronically Signed   By: Stan  Maynard M.D.   On: 01/21/2018 16:04    Review of Systems   Constitutional: Negative for weight loss.  HENT: Negative for ear discharge, ear pain, hearing loss and tinnitus.   Eyes: Negative for blurred vision, double vision, photophobia and pain.  Respiratory: Negative for cough, sputum production and shortness of breath.   Cardiovascular: Negative for chest pain.  Gastrointestinal: Negative for abdominal pain, nausea and vomiting.  Genitourinary: Negative for dysuria, flank pain, frequency and urgency.  Musculoskeletal: Positive for joint pain (Right hip). Negative for back pain, falls, myalgias and neck pain.  Neurological: Negative for dizziness, tingling, sensory change, focal weakness, loss of consciousness and headaches.  Endo/Heme/Allergies: Does not bruise/bleed easily.  Psychiatric/Behavioral: Negative for depression, memory loss and substance abuse. The patient is not nervous/anxious.    Blood pressure (!) 151/70, pulse 90, temperature 98.1 F (36.7 C), temperature source Oral, resp. rate (!) 23, SpO2 (!) 88 %. Physical Exam  Constitutional: She appears well-developed and well-nourished. No distress.  HENT:  Head: Normocephalic and atraumatic.  Eyes: Conjunctivae are normal. Right eye exhibits no discharge. Left eye exhibits no discharge. No scleral icterus.  Neck: Normal range of motion.  Cardiovascular: Normal rate. An irregularly irregular rhythm present.  Respiratory: Effort normal. No respiratory distress.  Musculoskeletal:  RLE No traumatic wounds, ecchymosis, or rash  TTP hip  No knee or ankle effusion  Knee stable to varus/ valgus and anterior/posterior stress  Sens DPN, SPN, TN intact  Motor EHL, ext, flex, evers 5/5  DP 2+, PT 2+, No significant edema  Neurological: She is alert.  Skin: Skin is warm and dry. She is not diaphoretic.  Psychiatric: She has a normal mood and affect. Her behavior is normal.    Assessment/Plan: Fall Right femoral neck fx -- Will need hemiarthroplasty. Given INR of 1.8 today I suspect we  can operate tomorrow. Dr. Haddix on call today but may be able to get done sooner with Dr. Swinteck in AM. Will make NPO after MN. Multiple medical problems including afib on coumadin, CHF, CKD, HLD, HTN, and hypothyroidism -- IM to admit and manage, please hold coumadin    Cruzita Lipa J. Thersea Manfredonia, PA-C Orthopedic Surgery [phone] 01/05/2018, 4:23 PM  

## 2018-01-22 NOTE — ED Notes (Signed)
Doris Cheadle Daughter- [phone]

## 2018-01-22 NOTE — H&P (Signed)
History and Physical    Tonya Farley:[id number] DOB: 1919-06-21 DOA: 12/29/2017  PCP: Burton Apley, MD  Patient coming from: [facility]illed nursing facility.  Chief Complaint: Fall.  HPI: Tonya Farley is a 84 y.o. female with history of chronic systolic heart failure last EF measured was 30 to 35% in July 2019, chronic kidney disease stage III, atrial fibrillation, history of stroke, hypertension, hypothyroidism who usually ambulates with the help of a walker had a fall today while trying to sit on the chair.  Patient states she missed the distance and fell.  Did not hit her head or lose consciousness.  Did not have any headache chest pain shortness of breath or palpitations.  She fell onto her side and started hurting her right hip.  ED Course: In the ER x-rays revealed right hip fracture and on-call orthopedic surgeon was consulted.  Patient's INR is around 1.5.  While in the ER patient went to A. fib with RVR for which I have ordered 1 dose of 10 mg IV Cardizem bolus.  Patient otherwise is not short of breath or hypotensive.  Chest x-ray shows chronic changes.  Was given 1 dose of morphine for pain from the fracture.  Review of Systems: As per HPI, rest all negative.   Past Medical History:  Diagnosis Date  . Chronic a-fib   . Chronic back pain   . Chronic systolic (congestive) heart failure (HCC)    reduced EF since 2018 to 35-40%  . CKD (chronic kidney disease) stage 4, GFR 15-29 ml/min (HCC) 09/11/2015  . Hyperlipidemia   . Hypertension   . Hypothyroidism   . Pulmonary hypertension (HCC)    PASP 65 mmHg at Echo 3/17  . Stroke (HCC)   . Thyroid disease     Past Surgical History:  Procedure Laterality Date  . APPENDECTOMY    . cataracts     bilateral  . EYE SURGERY     left eye "hole"  . IR KYPHO THORACIC WITH BONE BIOPSY  09/26/2017  . IR RADIOLOGIST EVAL & MGMT  09/18/2017  . TONSILLECTOMY       reports that she has never smoked. She has never used smokeless  tobacco. She reports that she does not drink alcohol or use drugs.  Allergies  Allergen Reactions  . Penicillins Hives and Swelling    Has patient had a PCN reaction causing immediate rash, facial/tongue/throat swelling, SOB or lightheadedness with hypotension: Yes Has patient had a PCN reaction causing severe rash involving mucus membranes or skin necrosis: Yes Has patient had a PCN reaction that required hospitalization No Has patient had a PCN reaction occurring within the last 10 years: No If all of the above answers are "NO", then may proceed with Cephalosporin use.    . Sulfa Antibiotics Anaphylaxis and Swelling  . Fish Allergy Nausea And Vomiting    Family History  Problem Relation Age of Onset  . Heart disease Sister   . Heart disease Sister   . Cancer Mother   . Other Father   . Heart attack Father     Prior to Admission medications   Medication Sig Start Date End Date Taking? Authorizing Provider  allopurinol (ZYLOPRIM) 100 MG tablet  10/05/17   [provider]  atorvastatin (LIPITOR) 10 MG tablet Take 10 mg by mouth daily.    [provider]  B Complex Vitamins (VITAMIN B COMPLEX) TABS Take 1 tablet by mouth daily with lunch.    [provider]  brimonidine (ALPHAGAN) 0.15 % ophthalmic solution Place 1 drop into both eyes 3 (three) times daily.    [provider]  cholecalciferol (VITAMIN D) 400 units TABS tablet Take 400 Units by mouth daily.    [provider]  diltiazem (CARDIZEM CD) 120 MG 24 hr capsule Take 1 capsule (120 mg total) by mouth daily. 08/26/17   Corky Crafts, MD  furosemide (LASIX) 40 MG tablet Take 1 tablet (40 mg total) by mouth daily. 01/08/18   Dyann Kief, PA-C  guaiFENesin-dextromethorphan (ROBITUSSIN DM) 100-10 MG/5ML syrup Take 5 mLs by mouth every 4 (four) hours as needed for cough. 10/08/17   Rai, Delene Ruffini, MD  isosorbide mononitrate (IMDUR) 30 MG 24 hr tablet Take 1 tablet (30 mg total)  by mouth daily. 09/05/17   Corky Crafts, MD  latanoprost (XALATAN) 0.005 % ophthalmic solution Place 1 drop into both eyes at bedtime. 01/10/14   [provider]  levalbuterol Pauline Aus) 0.63 MG/3ML nebulizer solution Take 3 mLs (0.63 mg total) by nebulization 3 (three) times daily for 20 days. And q4hours as needed for SOB/wheezing Patient taking differently: Take 0.63 mg by nebulization See admin instructions. Take 3 mls by Nebulization three times daily for 20 days, then change to every 4 hours as needed for shortness of breath and wheezing 10/08/17 11/11/17  Rai, Delene Ruffini, MD  levothyroxine (SYNTHROID, LEVOTHROID) 88 MCG tablet Take 88 mcg by mouth daily before breakfast.    [provider]  metoprolol tartrate (LOPRESSOR) 25 MG tablet Take 0.5 tablets (12.5 mg total) by mouth daily. 11/12/17   Dyann Kief, PA-C  mometasone-formoterol (DULERA) 100-5 MCG/ACT AERO Inhale 2 puffs into the lungs 2 (two) times daily. 10/08/17   Rai, Ripudeep K, MD  ondansetron (ZOFRAN) 4 MG tablet Take 4 mg by mouth every 4 (four) hours as needed for nausea or vomiting.    [provider]  Polyethyl Glycol-Propyl Glycol (SYSTANE OP) Apply 2 drops to eye daily as needed (dry eyes).     [provider]  potassium chloride SA (K-DUR,KLOR-CON) 20 MEQ tablet Take 1 tablet (20 mEq total) by mouth daily. 12/03/17   Dyann Kief, PA-C  spironolactone (ALDACTONE) 25 MG tablet TAKE 1/2 TABLET BY MOUTH ON TUESDAY, THURSDAY AND SATURDAY 11/11/17   Dyann Kief, PA-C  timolol (TIMOPTIC) 0.5 % ophthalmic solution Place 1 drop into both eyes daily.  12/28/13   [provider]  warfarin (COUMADIN) 1 MG tablet TAKE 2.5 TABLETS DAILY AS DIRECTED BY COUMADIN CLINIC 10/31/17   [provider]  warfarin (COUMADIN) 2 MG tablet Take 2 mg by mouth at bedtime.    [provider]    Physical Exam: Vitals:   Feb 01, 2018 2000 02/01/2018 2030 01-Feb-2018 2034 01-Feb-2018 2044  BP:  110/69  114/64   Pulse: 77 (!) 132 93 (!) 101  Resp: 16 (!) 22 (!) 22 (!) 21  Temp:      TempSrc:      SpO2: 95%  94%       Constitutional: Moderately built and nourished. Vitals:   02-01-2018 2000 01-Feb-2018 2030 2018-02-01 2034 2018/02/01 2044  BP: 110/69  114/64   Pulse: 77 (!) 132 93 (!) 101  Resp: 16 (!) 22 (!) 22 (!) 21  Temp:      TempSrc:      SpO2: 95%  94%    Eyes: Anicteric no pallor. ENMT: No discharge from the ears eyes nose or mouth. Neck: No mass felt.  No neck rigidity.  No JVD appreciated. Respiratory: No rhonchi or crepitations. Cardiovascular: S1-S2 heard no murmurs appreciated. Abdomen: Soft nontender bowel sounds present. Musculoskeletal: Pain on moving right hip. Skin: No rash. Neurologic: Alert awake oriented to time place and person.  Moves all extremities. Psychiatric: Appears normal per normal affect.   Labs on Admission: I have personally reviewed following labs and imaging studies  CBC: Recent Labs  Lab 2018-02-03 1702  WBC 10.9*  NEUTROABS 8.9*  HGB 12.1  HCT 41.5  MCV 89.6  PLT 190   Basic Metabolic Panel: Recent Labs  Lab 02-03-2018 1702  NA 140  K 3.9  CL 100  CO2 25  GLUCOSE 104*  BUN 27*  CREATININE 1.51*  CALCIUM 9.5   GFR: CrCl cannot be calculated (Unknown ideal weight.). Liver Function Tests: No results for input(s): AST, ALT, ALKPHOS, BILITOT, PROT, ALBUMIN in the last 168 hours. No results for input(s): LIPASE, AMYLASE in the last 168 hours. No results for input(s): AMMONIA in the last 168 hours. Coagulation Profile: Recent Labs  Lab 2018-02-03 1702  INR 1.51   Cardiac Enzymes: No results for input(s): CKTOTAL, CKMB, CKMBINDEX, TROPONINI in the last 168 hours. BNP (last 3 results) No results for input(s): PROBNP in the last 8760 hours. HbA1C: No results for input(s): HGBA1C in the last 72 hours. CBG: No results for input(s): GLUCAP in the last 168 hours. Lipid Profile: No results for input(s): CHOL, HDL, LDLCALC,  TRIG, CHOLHDL, LDLDIRECT in the last 72 hours. Thyroid Function Tests: No results for input(s): TSH, T4TOTAL, FREET4, T3FREE, THYROIDAB in the last 72 hours. Anemia Panel: No results for input(s): VITAMINB12, FOLATE, FERRITIN, TIBC, IRON, RETICCTPCT in the last 72 hours. Urine analysis:    Component Value Date/Time   COLORURINE STRAW (A) 10/15/2017 1253   APPEARANCEUR CLEAR 10/15/2017 1253   LABSPEC 1.008 10/15/2017 1253   PHURINE 7.0 10/15/2017 1253   GLUCOSEU NEGATIVE 10/15/2017 1253   HGBUR NEGATIVE 10/15/2017 1253   BILIRUBINUR NEGATIVE 10/15/2017 1253   KETONESUR NEGATIVE 10/15/2017 1253   PROTEINUR NEGATIVE 10/15/2017 1253   UROBILINOGEN 1.0 01/10/2015 2237   NITRITE NEGATIVE 10/15/2017 1253   LEUKOCYTESUR NEGATIVE 10/15/2017 1253   Sepsis Labs: @LABRCNTIP (procalcitonin:4,lacticidven:4) )No results found for this or any previous visit (from the past 240 hour(s)).   Radiological Exams on Admission: Dg Chest 1 View  Result Date: 02-03-18 CLINICAL DATA:  Recent fall today with right hip pain, initial encounter EXAM: CHEST  1 VIEW COMPARISON:  11/12/2017 FINDINGS: Cardiac shadow is enlarged. Aortic calcifications are again seen. Some chronic appearing changes in the left base are noted with mild scarring and blunting of left costophrenic angle. No acute focal infiltrate is noted. Mild elevation of the left hemidiaphragm is noted. IMPRESSION: Chronic changes in the left base.  No acute abnormality is noted. Electronically Signed   By: Alcide Clever M.D.   On: 02-03-2018 16:03   Dg Hip Unilat W Or Wo Pelvis 2-3 Views Right  Result Date: 2018-02-03 CLINICAL DATA:  Fall, RIGHT hip pain. EXAM: DG HIP (WITH OR WITHOUT PELVIS) 2-3V RIGHT COMPARISON:  None. FINDINGS: Displaced fracture of the RIGHT femoral neck, partially obscured by the overlying greater trochanter, most likely subcapital region. Femoral head appears normally positioned relative to the acetabulum. Osseous structures of  the pelvis are diffusely osteopenic limiting characterization of osseous detail, however, no additional fracture line or displaced fracture fragment is identified. Sacrum is partially obscured by overlying bowel gas. Extensive vascular calcifications within the pelvis and upper  thighs. Soft tissues about the pelvis and RIGHT hip are otherwise unremarkable. IMPRESSION: 1. Displaced fracture of the RIGHT femoral neck, partially obscured by overlying osseous structures, most likely confined to the subcapital region. 2. Osteopenia. 3. Extensive vascular calcifications. Electronically Signed   By: Bary Richard M.D.   On: 01-26-2018 16:04    EKG: Independently reviewed.  A. fib with RVR.  Assessment/Plan Principal Problem:   Closed right hip fracture, initial encounter (HCC) Active Problems:   Hypertension   Chronic systolic CHF (congestive heart failure) (HCC), EF 35%   CKD (chronic kidney disease) stage 3, GFR 30-59 ml/min (HCC)   Pulmonary hypertension, moderate to severe (HCC)   Atrial fibrillation with RVR (HCC)   Hypothyroidism, adult   Hip fracture (HCC)    1. Closed right hip fracture secondary to mechanical fall -patient appears to be at moderate risk for intermediate risk procedure.  Will hold off Coumadin for now in anticipation of procedure.  Noted patient is in A. fib with RVR for which patient was given Cardizem bolus and also we will keep patient home dose of metoprolol to control her heart rate. 2. A. fib with RVR -patient was given Cardizem bolus 10 mg IV 1 dose and also will give patient home dose of metoprolol.  Closely monitor and stepdown.  If heart rate remains to be high will need to start patient on infusion.  Holding Coumadin for possible surgery patient agreeable. 3. Chronic systolic heart failure last EF measured in July 2019 was 30 to 35%.  Appears compensated at this time we will hold off patient's Lasix and spironolactone in anticipation of possible surgery and restart  once patient is stable after surgery. 4. Hypothyroidism on Synthroid check TSH. 5. History of bronchitis on inhalers but not wheezing at this time continue home dose. 6. Chronic kidney disease stage III creatinine appears to be at baseline. 7. Hypertension we will continue Cardizem and metoprolol.   DVT prophylaxis: SCDs. Code Status: DNR. Family Communication: No family at the bedside. Disposition Plan: Skilled nursing facility. Consults called: Orthopedics. Admission status: Inpatient.   Eduard Clos MD [facility] Pager [phone].  If 7PM-7AM, please contact night-coverage [email or web address] Password [email or web address]  2018/01/26, 9:08 PM

## 2018-01-22 NOTE — ED Provider Notes (Signed)
[facility] EMERGENCY DEPARTMENT Provider Note   CSN: [id number] Arrival date & time: 12/27/2017  1401     History   Chief Complaint Chief Complaint  Patient presents with  . Fall  . Atrial Fibrillation    HPI Tonya Farley is a 84 y.o. female.  HPI   84 year old female presenting after fall.  Happened earlier this afternoon.  Patient was attempting to reach for a walker when she lost her balance and fell onto her right side.  She has had persistent right hip pain since then.  Tolerable at rest.  Sniffily worse with attempted movement.  She did not hit her head.  She denies any significant pain elsewhere.  She is on warfarin for history of atrial fibrillation.  Past Medical History:  Diagnosis Date  . Chronic a-fib   . Chronic back pain   . Chronic systolic (congestive) heart failure (HCC)    reduced EF since 2018 to 35-40%  . CKD (chronic kidney disease) stage 4, GFR 15-29 ml/min (HCC) 09/11/2015  . Hyperlipidemia   . Hypertension   . Hypothyroidism   . Pulmonary hypertension (HCC)    PASP 65 mmHg at Echo 3/17  . Stroke (HCC)   . Thyroid disease     Patient Active Problem List   Diagnosis Date Noted  . Recent cerebrovascular accident (CVA) 11/11/2017  . Congestive heart failure (HCC)   . Sepsis (HCC) 10/15/2017  . Hypernatremia 10/15/2017  . Cerebral embolism with cerebral infarction 10/07/2017  . Pressure injury of skin 10/05/2017  . Acute bronchitis 10/05/2017  . Viral bronchitis 10/04/2017  . Acute on chronic systolic heart failure (HCC) 07/09/2017  . Atrial fibrillation with RVR (HCC) 07/09/2017  . Hypothyroidism, adult 07/09/2017  . Pulmonary HTN (HCC) 07/09/2017  . Leukocytosis 07/09/2017  . Acute respiratory failure with hypoxia (HCC) 07/09/2017  . High blood pressure 07/09/2017  . Chronic systolic heart failure (HCC)   . Acute on chronic right heart failure (HCC) 12/22/2016  . CKD (chronic kidney disease), stage III w/ renal  artery stenosis 12/22/2016  . Cellulitis of lower extremity 12/22/2016  . Chronic a-fib 12/22/2016  . Hypothyroidism 12/22/2016  . Acute diastolic heart failure (HCC) 12/22/2016  . Elevated troponin 12/22/2016  . Acute hypokalemia 12/22/2016  . HTN (hypertension) 12/22/2016  . Dyslipidemia 12/22/2016  . Pulmonary hypertension, moderate to severe (HCC) 12/22/2016  . Abnormal urinalysis 12/22/2016  . Hypertensive heart disease 05/08/2016  . Chronic diastolic heart failure (HCC) 11/28/2015  . Anticoagulated 11/28/2015  . CKD (chronic kidney disease) stage 3, GFR 30-59 ml/min (HCC) 09/11/2015  . CKD (chronic kidney disease) stage 4, GFR 15-29 ml/min (HCC) 09/11/2015  . Pulmonary hypertension (HCC)   . Pleural effusion on left   . Peripheral edema   . Hypokalemia   . Anasarca 08/10/2015  . Acute on chronic respiratory failure with hypoxia (HCC)   . Pleural effusion   . Physical deconditioning   . Cough   . HCAP (healthcare-associated pneumonia)   . Chest congestion   . SOB (shortness of breath)   . Swelling   . Chronic systolic CHF (congestive heart failure) (HCC), EF 35%   . HLD (hyperlipidemia)   . Influenza due to identified novel influenza A virus with other respiratory manifestations 06/07/2015  . Pleural effusion, bacterial   . Dyspnea   . AKI (acute kidney injury) (HCC)   . CAP (community acquired pneumonia) 05/30/2015  . Renal artery stenosis (HCC) 08/08/2014  . Encounter for therapeutic drug monitoring  04/21/2013  . Hypertension 06/25/2012  . Hyperlipidemia 06/25/2012  . Atrial fibrillation (HCC) 06/25/2012  . Compression fracture of lumbar spine, non-traumatic (HCC) 06/25/2012  . Chronic back pain     Past Surgical History:  Procedure Laterality Date  . APPENDECTOMY    . cataracts     bilateral  . EYE SURGERY     left eye "hole"  . IR KYPHO THORACIC WITH BONE BIOPSY  09/26/2017  . IR RADIOLOGIST EVAL & MGMT  09/18/2017  . TONSILLECTOMY       OB History     None      Home Medications    Prior to Admission medications   Medication Sig Start Date End Date Taking? Authorizing Provider  allopurinol (ZYLOPRIM) 100 MG tablet  10/05/17   [provider]  atorvastatin (LIPITOR) 10 MG tablet Take 10 mg by mouth daily.    [provider]  B Complex Vitamins (VITAMIN B COMPLEX) TABS Take 1 tablet by mouth daily with lunch.    [provider]  brimonidine (ALPHAGAN) 0.15 % ophthalmic solution Place 1 drop into both eyes 3 (three) times daily.    [provider]  cholecalciferol (VITAMIN D) 400 units TABS tablet Take 400 Units by mouth daily.    [provider]  diltiazem (CARDIZEM CD) 120 MG 24 hr capsule Take 1 capsule (120 mg total) by mouth daily. 08/26/17   Corky Crafts, MD  furosemide (LASIX) 40 MG tablet Take 1 tablet (40 mg total) by mouth daily. 01/08/18   Dyann Kief, PA-C  guaiFENesin-dextromethorphan (ROBITUSSIN DM) 100-10 MG/5ML syrup Take 5 mLs by mouth every 4 (four) hours as needed for cough. 10/08/17   Rai, Delene Ruffini, MD  isosorbide mononitrate (IMDUR) 30 MG 24 hr tablet Take 1 tablet (30 mg total) by mouth daily. 09/05/17   Corky Crafts, MD  latanoprost (XALATAN) 0.005 % ophthalmic solution Place 1 drop into both eyes at bedtime. 01/10/14   [provider]  levalbuterol Pauline Aus) 0.63 MG/3ML nebulizer solution Take 3 mLs (0.63 mg total) by nebulization 3 (three) times daily for 20 days. And q4hours as needed for SOB/wheezing Patient taking differently: Take 0.63 mg by nebulization See admin instructions. Take 3 mls by Nebulization three times daily for 20 days, then change to every 4 hours as needed for shortness of breath and wheezing 10/08/17 11/11/17  Rai, Delene Ruffini, MD  levothyroxine (SYNTHROID, LEVOTHROID) 88 MCG tablet Take 88 mcg by mouth daily before breakfast.    [provider]  metoprolol tartrate (LOPRESSOR) 25 MG tablet Take 0.5 tablets (12.5 mg  total) by mouth daily. 11/12/17   Dyann Kief, PA-C  mometasone-formoterol (DULERA) 100-5 MCG/ACT AERO Inhale 2 puffs into the lungs 2 (two) times daily. 10/08/17   Rai, Ripudeep K, MD  ondansetron (ZOFRAN) 4 MG tablet Take 4 mg by mouth every 4 (four) hours as needed for nausea or vomiting.    [provider]  Polyethyl Glycol-Propyl Glycol (SYSTANE OP) Apply 2 drops to eye daily as needed (dry eyes).     [provider]  potassium chloride SA (K-DUR,KLOR-CON) 20 MEQ tablet Take 1 tablet (20 mEq total) by mouth daily. 12/03/17   Dyann Kief, PA-C  spironolactone (ALDACTONE) 25 MG tablet TAKE 1/2 TABLET BY MOUTH ON TUESDAY, THURSDAY AND SATURDAY 11/11/17   Dyann Kief, PA-C  timolol (TIMOPTIC) 0.5 % ophthalmic solution Place 1 drop into both eyes daily.  12/28/13   [provider]  warfarin (  COUMADIN) 1 MG tablet TAKE 2.5 TABLETS DAILY AS DIRECTED BY COUMADIN CLINIC 10/31/17   [provider]  warfarin (COUMADIN) 2 MG tablet Take 2 mg by mouth at bedtime.    [provider]    Family History Family History  Problem Relation Age of Onset  . Heart disease Sister   . Heart disease Sister   . Cancer Mother   . Other Father   . Heart attack Father     Social History Social History   Tobacco Use  . Smoking status: Never Smoker  . Smokeless tobacco: Never Used  Substance Use Topics  . Alcohol use: No  . Drug use: No     Allergies   Penicillins; Sulfa antibiotics; and Fish allergy   Review of Systems Review of Systems  All systems reviewed and negative, other than as noted in HPI.  Physical Exam Updated Vital Signs BP (!) 153/74 (BP Location: Right Arm)   Pulse 94   Temp 98.1 F (36.7 C) (Oral)   Resp 16   SpO2 (!) 88% Comment: 88 on RA- 4L 96%  Physical Exam  Constitutional: She appears well-developed and well-nourished. No distress.  HENT:  Head: Normocephalic and atraumatic.  Eyes: Conjunctivae are normal. Right  eye exhibits no discharge. Left eye exhibits no discharge.  Neck: Neck supple.  Cardiovascular: Normal rate, regular rhythm and normal heart sounds. Exam reveals no gallop and no friction rub.  No murmur heard. Pulmonary/Chest: Effort normal and breath sounds normal. No respiratory distress.  Abdominal: Soft. She exhibits no distension. There is no tenderness.  Musculoskeletal: She exhibits tenderness. She exhibits no edema.  Right lower extremity is slightly externally rotated.  Does not seem shortened.  Significant pain with attempted range of motion of the right hip. Foot warm. Good DP pulse. Sensation intact to light touch.   Neurological: She is alert.  Skin: Skin is warm and dry.  Psychiatric: She has a normal mood and affect. Her behavior is normal. Thought content normal.  Nursing note and vitals reviewed.    ED Treatments / Results  Labs (all labs ordered are listed, but only abnormal results are displayed) Labs Reviewed  CBC WITH DIFFERENTIAL/PLATELET - Abnormal; Notable for the following components:      Result Value   WBC 10.9 (*)    MCHC 29.2 (*)    RDW 17.2 (*)    Neutro Abs 8.9 (*)    Lymphs Abs 0.6 (*)    Monocytes Absolute 1.2 (*)    Abs Immature Granulocytes 0.14 (*)    All other components within normal limits  BASIC METABOLIC PANEL - Abnormal; Notable for the following components:   Glucose, Bld 104 (*)    BUN 27 (*)    Creatinine, Ser 1.51 (*)    GFR calc non Af Amer 28 (*)    GFR calc Af Amer 32 (*)    All other components within normal limits  PROTIME-INR - Abnormal; Notable for the following components:   Prothrombin Time 18.0 (*)    All other components within normal limits  CBC - Abnormal; Notable for the following components:   Hemoglobin 10.9 (*)    HCT 35.8 (*)    RDW 17.1 (*)    Platelets 146 (*)    All other components within normal limits  BASIC METABOLIC PANEL - Abnormal; Notable for the following components:   Glucose, Bld 106 (*)     BUN 26 (*)    Creatinine, Ser 1.50 (*)  Calcium 8.7 (*)    GFR calc non Af Amer 28 (*)    GFR calc Af Amer 33 (*)    All other components within normal limits  PROTIME-INR - Abnormal; Notable for the following components:   Prothrombin Time 18.7 (*)    All other components within normal limits  SURGICAL PCR SCREEN  TSH  TROPONIN I  TYPE AND SCREEN  ABO/RH    EKG EKG Interpretation  Date/Time:  Thursday January 22 2018 16:22:31 EDT Ventricular Rate:  98 PR Interval:    QRS Duration: 112 QT Interval:  401 QTC Calculation: 512 R Axis:   -72 Text Interpretation:  Atrial fibrillation Left anterior fascicular block Probable left ventricular hypertrophy Anterior Q waves, possibly due to LVH Prolonged QT interval similar pattern to prior 7/19 Confirmed by Meridee Score [phone]) on 01/21/2018 4:38:30 PM   Radiology Dg Chest 1 View  Result Date: 01/19/2018 CLINICAL DATA:  Recent fall today with right hip pain, initial encounter EXAM: CHEST  1 VIEW COMPARISON:  11/12/2017 FINDINGS: Cardiac shadow is enlarged. Aortic calcifications are again seen. Some chronic appearing changes in the left base are noted with mild scarring and blunting of left costophrenic angle. No acute focal infiltrate is noted. Mild elevation of the left hemidiaphragm is noted. IMPRESSION: Chronic changes in the left base.  No acute abnormality is noted. Electronically Signed   By: Alcide Clever M.D.   On: 01/04/2018 16:03   Dg Hip Unilat W Or Wo Pelvis 2-3 Views Right  Result Date: 01/17/2018 CLINICAL DATA:  Fall, RIGHT hip pain. EXAM: DG HIP (WITH OR WITHOUT PELVIS) 2-3V RIGHT COMPARISON:  None. FINDINGS: Displaced fracture of the RIGHT femoral neck, partially obscured by the overlying greater trochanter, most likely subcapital region. Femoral head appears normally positioned relative to the acetabulum. Osseous structures of the pelvis are diffusely osteopenic limiting characterization of osseous detail, however, no  additional fracture line or displaced fracture fragment is identified. Sacrum is partially obscured by overlying bowel gas. Extensive vascular calcifications within the pelvis and upper thighs. Soft tissues about the pelvis and RIGHT hip are otherwise unremarkable. IMPRESSION: 1. Displaced fracture of the RIGHT femoral neck, partially obscured by overlying osseous structures, most likely confined to the subcapital region. 2. Osteopenia. 3. Extensive vascular calcifications. Electronically Signed   By: Bary Richard M.D.   On: 12/24/2017 16:04    Procedures Procedures (including critical care time)  Medications Ordered in ED Medications - No data to display   Initial Impression / Assessment and Plan / ED Course  I have reviewed the triage vital signs and the nursing notes.  Pertinent labs & imaging results that were available during my care of the patient were reviewed by me and considered in my medical decision making (see chart for details).     89yF with R hip pain after fall. Has fx. On coumadin. Oral vitamin K. Ortho consult. Medicine admission.   Final Clinical Impressions(s) / ED Diagnoses   Final diagnoses:  Closed fracture of neck of right femur, initial encounter [facility])    ED Discharge Orders    None       Raeford Razor, MD 2018/02/04 [phone]

## 2018-01-22 NOTE — ED Triage Notes (Addendum)
Pt here for evaluation of fall that happened this afternoon. Per EMS, pt fell attempting to reach for her walker and landed on her right hip. C/o right hip pain with movement. Able to move all extremities at this time. Denies dizziness/lightheadedness. No LOC. Did not hit her head. On blood thinners for a-fib (warfarin). Denies chest pain/shortness of breath. Given 4mg  zofran by EMS for nausea.

## 2018-01-22 NOTE — ED Provider Notes (Signed)
Signout from Dr. Juleen China.  84 year old female on Coumadin here after mechanical fall.  She has a right hip fracture and orthopedics has been consulted.  She is pending some screening lab work and once this is resulted the plan is to call the hospitalist for admission.  Anticipated plan from orthopedics Dr. Jena Gauss is to allow the INR to drift down until safe for surgery.       Terrilee Files, MD 02/09/2018 1130

## 2018-01-22 NOTE — Consult Note (Signed)
Reason for Consult:Right hip fx Referring Physician: [name] is an 84 y.o. female.  HPI: Tonya Farley was at lunch and her RW was a little too far away. When she got up and tried to get to it she lost her balance and fell. She had immediate right hip pain and could not get up. She came to the ED and x-rays showed a right femoral neck fx and orthopedic surgery was consulted. She c/o localized pain to the area. She lives at [facility].  Past Medical History:  Diagnosis Date  . Chronic a-fib   . Chronic back pain   . Chronic systolic (congestive) heart failure (HCC)    reduced EF since 2018 to 35-40%  . CKD (chronic kidney disease) stage 4, GFR 15-29 ml/min (HCC) 09/11/2015  . Hyperlipidemia   . Hypertension   . Hypothyroidism   . Pulmonary hypertension (HCC)    PASP 65 mmHg at Echo 3/17  . Stroke (HCC)   . Thyroid disease     Past Surgical History:  Procedure Laterality Date  . APPENDECTOMY    . cataracts     bilateral  . EYE SURGERY     left eye "hole"  . IR KYPHO THORACIC WITH BONE BIOPSY  09/26/2017  . IR RADIOLOGIST EVAL & MGMT  09/18/2017  . TONSILLECTOMY      Family History  Problem Relation Age of Onset  . Heart disease Sister   . Heart disease Sister   . Cancer Mother   . Other Father   . Heart attack Father     Social History:  reports that she has never smoked. She has never used smokeless tobacco. She reports that she does not drink alcohol or use drugs.  Allergies:  Allergies  Allergen Reactions  . Penicillins Hives and Swelling    Has patient had a PCN reaction causing immediate rash, facial/tongue/throat swelling, SOB or lightheadedness with hypotension: Yes Has patient had a PCN reaction causing severe rash involving mucus membranes or skin necrosis: Yes Has patient had a PCN reaction that required hospitalization No Has patient had a PCN reaction occurring within the last 10 years: No If all of the above answers are "NO", then may  proceed with Cephalosporin use.    . Sulfa Antibiotics Anaphylaxis and Swelling  . Fish Allergy Nausea And Vomiting    Medications: I have reviewed the patient's current medications.  No results found for this or any previous visit (from the past 48 hour(s)).  Dg Chest 1 View  Result Date: 12/28/2017 CLINICAL DATA:  Recent fall today with right hip pain, initial encounter EXAM: CHEST  1 VIEW COMPARISON:  11/12/2017 FINDINGS: Cardiac shadow is enlarged. Aortic calcifications are again seen. Some chronic appearing changes in the left base are noted with mild scarring and blunting of left costophrenic angle. No acute focal infiltrate is noted. Mild elevation of the left hemidiaphragm is noted. IMPRESSION: Chronic changes in the left base.  No acute abnormality is noted. Electronically Signed   By: Alcide Clever M.D.   On: 12/28/2017 16:03   Dg Hip Unilat W Or Wo Pelvis 2-3 Views Right  Result Date: 01/17/2018 CLINICAL DATA:  Fall, RIGHT hip pain. EXAM: DG HIP (WITH OR WITHOUT PELVIS) 2-3V RIGHT COMPARISON:  None. FINDINGS: Displaced fracture of the RIGHT femoral neck, partially obscured by the overlying greater trochanter, most likely subcapital region. Femoral head appears normally positioned relative to the acetabulum. Osseous structures of the pelvis are diffusely osteopenic limiting  characterization of osseous detail, however, no additional fracture line or displaced fracture fragment is identified. Sacrum is partially obscured by overlying bowel gas. Extensive vascular calcifications within the pelvis and upper thighs. Soft tissues about the pelvis and RIGHT hip are otherwise unremarkable. IMPRESSION: 1. Displaced fracture of the RIGHT femoral neck, partially obscured by overlying osseous structures, most likely confined to the subcapital region. 2. Osteopenia. 3. Extensive vascular calcifications. Electronically Signed   By: Bary Richard M.D.   On: 01-30-2018 16:04    Review of Systems   Constitutional: Negative for weight loss.  HENT: Negative for ear discharge, ear pain, hearing loss and tinnitus.   Eyes: Negative for blurred vision, double vision, photophobia and pain.  Respiratory: Negative for cough, sputum production and shortness of breath.   Cardiovascular: Negative for chest pain.  Gastrointestinal: Negative for abdominal pain, nausea and vomiting.  Genitourinary: Negative for dysuria, flank pain, frequency and urgency.  Musculoskeletal: Positive for joint pain (Right hip). Negative for back pain, falls, myalgias and neck pain.  Neurological: Negative for dizziness, tingling, sensory change, focal weakness, loss of consciousness and headaches.  Endo/Heme/Allergies: Does not bruise/bleed easily.  Psychiatric/Behavioral: Negative for depression, memory loss and substance abuse. The patient is not nervous/anxious.    Blood pressure (!) 151/70, pulse 90, temperature 98.1 F (36.7 C), temperature source Oral, resp. rate (!) 23, SpO2 (!) 88 %. Physical Exam  Constitutional: She appears well-developed and well-nourished. No distress.  HENT:  Head: Normocephalic and atraumatic.  Eyes: Conjunctivae are normal. Right eye exhibits no discharge. Left eye exhibits no discharge. No scleral icterus.  Neck: Normal range of motion.  Cardiovascular: Normal rate. An irregularly irregular rhythm present.  Respiratory: Effort normal. No respiratory distress.  Musculoskeletal:  RLE No traumatic wounds, ecchymosis, or rash  TTP hip  No knee or ankle effusion  Knee stable to varus/ valgus and anterior/posterior stress  Sens DPN, SPN, TN intact  Motor EHL, ext, flex, evers 5/5  DP 2+, PT 2+, No significant edema  Neurological: She is alert.  Skin: Skin is warm and dry. She is not diaphoretic.  Psychiatric: She has a normal mood and affect. Her behavior is normal.    Assessment/Plan: Fall Right femoral neck fx -- Will need hemiarthroplasty. Given INR of 1.8 today I suspect we  can operate tomorrow. Dr. Jena Gauss on call today but may be able to get done sooner with Dr. Linna Caprice in AM. Will make NPO after MN. Multiple medical problems including afib on coumadin, CHF, CKD, HLD, HTN, and hypothyroidism -- IM to admit and manage, please hold coumadin    Freeman Caldron, PA-C Orthopedic Surgery [phone] 2018/01/30, 4:23 PM

## 2018-01-23 ENCOUNTER — Inpatient Hospital Stay (HOSPITAL_COMMUNITY): Payer: Medicare Other

## 2018-01-23 ENCOUNTER — Encounter (HOSPITAL_COMMUNITY): Admission: EM | Disposition: E | Payer: Self-pay | Source: Home / Self Care | Attending: Pulmonary Disease

## 2018-01-23 ENCOUNTER — Encounter (HOSPITAL_COMMUNITY): Payer: Self-pay | Admitting: Orthopedic Surgery

## 2018-01-23 ENCOUNTER — Other Ambulatory Visit (HOSPITAL_COMMUNITY): Payer: Medicare Other

## 2018-01-23 ENCOUNTER — Inpatient Hospital Stay (HOSPITAL_COMMUNITY): Payer: Medicare Other | Admitting: Registered Nurse

## 2018-01-23 ENCOUNTER — Ambulatory Visit: Payer: Medicare Other | Admitting: Interventional Cardiology

## 2018-01-23 DIAGNOSIS — I272 Pulmonary hypertension, unspecified: Secondary | ICD-10-CM

## 2018-01-23 DIAGNOSIS — N183 Chronic kidney disease, stage 3 (moderate): Secondary | ICD-10-CM

## 2018-01-23 DIAGNOSIS — I469 Cardiac arrest, cause unspecified: Secondary | ICD-10-CM

## 2018-01-23 DIAGNOSIS — I5022 Chronic systolic (congestive) heart failure: Secondary | ICD-10-CM

## 2018-01-23 DIAGNOSIS — Z9911 Dependence on respirator [ventilator] status: Secondary | ICD-10-CM

## 2018-01-23 DIAGNOSIS — I10 Essential (primary) hypertension: Secondary | ICD-10-CM

## 2018-01-23 DIAGNOSIS — S72001A Fracture of unspecified part of neck of right femur, initial encounter for closed fracture: Secondary | ICD-10-CM | POA: Diagnosis present

## 2018-01-23 DIAGNOSIS — I4891 Unspecified atrial fibrillation: Secondary | ICD-10-CM

## 2018-01-23 HISTORY — PX: ANTERIOR APPROACH HEMI HIP ARTHROPLASTY: SHX6690

## 2018-01-23 LAB — COMPREHENSIVE METABOLIC PANEL
ALT: 519 U/L — AB (ref 0–44)
ANION GAP: 13 (ref 5–15)
AST: 594 U/L — ABNORMAL HIGH (ref 15–41)
Albumin: 2.5 g/dL — ABNORMAL LOW (ref 3.5–5.0)
Alkaline Phosphatase: 67 U/L (ref 38–126)
BUN: 31 mg/dL — ABNORMAL HIGH (ref 8–23)
CHLORIDE: 105 mmol/L (ref 98–111)
CO2: 21 mmol/L — ABNORMAL LOW (ref 22–32)
CREATININE: 1.81 mg/dL — AB (ref 0.44–1.00)
Calcium: 9.9 mg/dL (ref 8.9–10.3)
GFR, EST AFRICAN AMERICAN: 26 mL/min — AB (ref >60)
GFR, EST NON AFRICAN AMERICAN: 22 mL/min — AB (ref >60)
Glucose, Bld: 136 mg/dL — ABNORMAL HIGH (ref 70–99)
POTASSIUM: 5.8 mmol/L — AB (ref 3.5–5.1)
SODIUM: 139 mmol/L (ref 135–145)
Total Bilirubin: 1.4 mg/dL — ABNORMAL HIGH (ref 0.3–1.2)
Total Protein: 5 g/dL — ABNORMAL LOW (ref 6.5–8.1)

## 2018-01-23 LAB — CBC
HEMATOCRIT: 35.8 % — AB (ref 36.0–46.0)
Hemoglobin: 10.9 g/dL — ABNORMAL LOW (ref 12.0–15.0)
MCH: 27.1 pg (ref 26.0–34.0)
MCHC: 30.4 g/dL (ref 30.0–36.0)
MCV: 89.1 fL (ref 80.0–100.0)
Platelets: 146 10*3/uL — ABNORMAL LOW (ref 150–400)
RBC: 4.02 MIL/uL (ref 3.87–5.11)
RDW: 17.1 % — AB (ref 11.5–15.5)
WBC: 10.2 10*3/uL (ref 4.0–10.5)
nRBC: 0 % (ref 0.0–0.2)

## 2018-01-23 LAB — CBC WITH DIFFERENTIAL/PLATELET
Abs Immature Granulocytes: 0.78 10*3/uL — ABNORMAL HIGH (ref 0.00–0.07)
BASOS ABS: 0.1 10*3/uL (ref 0.0–0.1)
Basophils Relative: 0 %
EOS ABS: 0 10*3/uL (ref 0.0–0.5)
EOS PCT: 0 %
HCT: 36 % (ref 36.0–46.0)
Hemoglobin: 10.6 g/dL — ABNORMAL LOW (ref 12.0–15.0)
IMMATURE GRANULOCYTES: 4 %
Lymphocytes Relative: 7 %
Lymphs Abs: 1.3 10*3/uL (ref 0.7–4.0)
MCH: 26.8 pg (ref 26.0–34.0)
MCHC: 29.4 g/dL — ABNORMAL LOW (ref 30.0–36.0)
MCV: 90.9 fL (ref 80.0–100.0)
Monocytes Absolute: 0.7 10*3/uL (ref 0.1–1.0)
Monocytes Relative: 4 %
NEUTROS PCT: 85 %
NRBC: 0.4 % — AB (ref 0.0–0.2)
Neutro Abs: 16.4 10*3/uL — ABNORMAL HIGH (ref 1.7–7.7)
Platelets: 151 10*3/uL (ref 150–400)
RBC: 3.96 MIL/uL (ref 3.87–5.11)
RDW: 17.1 % — ABNORMAL HIGH (ref 11.5–15.5)
WBC: 19.2 10*3/uL — AB (ref 4.0–10.5)

## 2018-01-23 LAB — TSH: TSH: 0.708 u[IU]/mL (ref 0.350–4.500)

## 2018-01-23 LAB — SURGICAL PCR SCREEN
MRSA, PCR: NEGATIVE
STAPHYLOCOCCUS AUREUS: NEGATIVE

## 2018-01-23 LAB — POCT I-STAT 7, (LYTES, BLD GAS, ICA,H+H)
Acid-base deficit: 12 mmol/L — ABNORMAL HIGH (ref 0.0–2.0)
BICARBONATE: 14.9 mmol/L — AB (ref 20.0–28.0)
Calcium, Ion: 1.13 mmol/L — ABNORMAL LOW (ref 1.15–1.40)
HEMATOCRIT: 32 % — AB (ref 36.0–46.0)
Hemoglobin: 10.9 g/dL — ABNORMAL LOW (ref 12.0–15.0)
O2 Saturation: 96 %
PCO2 ART: 34.7 mmHg (ref 32.0–48.0)
PO2 ART: 94 mmHg (ref 83.0–108.0)
POTASSIUM: 5.8 mmol/L — AB (ref 3.5–5.1)
Sodium: 138 mmol/L (ref 135–145)
TCO2: 16 mmol/L — AB (ref 22–32)
pH, Arterial: 7.241 — ABNORMAL LOW (ref 7.350–7.450)

## 2018-01-23 LAB — GLUCOSE, CAPILLARY
GLUCOSE-CAPILLARY: 101 mg/dL — AB (ref 70–99)
GLUCOSE-CAPILLARY: 83 mg/dL (ref 70–99)
GLUCOSE-CAPILLARY: 89 mg/dL (ref 70–99)

## 2018-01-23 LAB — LACTIC ACID, PLASMA: LACTIC ACID, VENOUS: 5.5 mmol/L — AB (ref 0.5–1.9)

## 2018-01-23 LAB — BASIC METABOLIC PANEL
Anion gap: 6 (ref 5–15)
BUN: 26 mg/dL — ABNORMAL HIGH (ref 8–23)
CO2: 26 mmol/L (ref 22–32)
CREATININE: 1.5 mg/dL — AB (ref 0.44–1.00)
Calcium: 8.7 mg/dL — ABNORMAL LOW (ref 8.9–10.3)
Chloride: 105 mmol/L (ref 98–111)
GFR calc Af Amer: 33 mL/min — ABNORMAL LOW (ref >60)
GFR calc non Af Amer: 28 mL/min — ABNORMAL LOW (ref >60)
GLUCOSE: 106 mg/dL — AB (ref 70–99)
Potassium: 3.9 mmol/L (ref 3.5–5.1)
Sodium: 137 mmol/L (ref 135–145)

## 2018-01-23 LAB — TROPONIN I
TROPONIN I: 0.08 ng/mL — AB (ref <0.03)
Troponin I: <0.03 ng/mL (ref <0.03)

## 2018-01-23 LAB — PROTIME-INR
INR: 1.58
Prothrombin Time: 18.7 seconds — ABNORMAL HIGH (ref 11.4–15.2)

## 2018-01-23 LAB — MRSA PCR SCREENING: MRSA BY PCR: NEGATIVE

## 2018-01-23 LAB — ABO/RH: ABO/RH(D): AB POS

## 2018-01-23 SURGERY — HEMIARTHROPLASTY, HIP, DIRECT ANTERIOR APPROACH, FOR FRACTURE
Anesthesia: General | Site: Hip | Laterality: Right

## 2018-01-23 MED ORDER — FENTANYL CITRATE (PF) 100 MCG/2ML IJ SOLN: 50.0000 ug | INTRAMUSCULAR | Status: DC | PRN

## 2018-01-23 MED ORDER — MENTHOL 3 MG MT LOZG
1.0000 | LOZENGE | OROMUCOSAL | Status: DC | PRN
  Filled 2018-01-23: qty 9

## 2018-01-23 MED ORDER — MORPHINE SULFATE (PF) 2 MG/ML IV SOLN: 0.5000 mg | INTRAVENOUS | Status: DC | PRN

## 2018-01-23 MED ORDER — PROPOFOL 10 MG/ML IV BOLUS
INTRAVENOUS | Status: DC | PRN
  Administered 2018-01-23: 70 mg via INTRAVENOUS

## 2018-01-23 MED ORDER — NOREPINEPHRINE 4 MG/250ML-% IV SOLN
0.0000 ug/min | INTRAVENOUS | Status: DC
  Administered 2018-01-23 (×2): 20 ug/min via INTRAVENOUS
  Administered 2018-01-24: 30 ug/min via INTRAVENOUS
  Administered 2018-01-24: 35 ug/min via INTRAVENOUS
  Administered 2018-01-24: 25 ug/min via INTRAVENOUS
  Administered 2018-01-24: 40 ug/min via INTRAVENOUS
  Administered 2018-01-24: 30 ug/min via INTRAVENOUS
  Administered 2018-01-24: 40 ug/min via INTRAVENOUS
  Administered 2018-01-24: 23 ug/min via INTRAVENOUS
  Filled 2018-01-23 (×6): qty 250
  Filled 2018-01-23: qty 500
  Filled 2018-01-23 (×3): qty 250

## 2018-01-23 MED ORDER — WARFARIN SODIUM 2.5 MG PO TABS
2.5000 mg | ORAL_TABLET | Freq: Once | ORAL | Status: DC
  Filled 2018-01-23: qty 1

## 2018-01-23 MED ORDER — SODIUM CHLORIDE 0.9 % IJ SOLN
INTRAMUSCULAR | Status: AC
  Filled 2018-01-23: qty 30

## 2018-01-23 MED ORDER — METOPROLOL TARTRATE 5 MG/5ML IV SOLN
INTRAVENOUS | Status: AC
  Filled 2018-01-23: qty 5

## 2018-01-23 MED ORDER — FENTANYL CITRATE (PF) 250 MCG/5ML IJ SOLN
INTRAMUSCULAR | Status: AC
  Filled 2018-01-23: qty 5

## 2018-01-23 MED ORDER — 0.9 % SODIUM CHLORIDE (POUR BTL) OPTIME
TOPICAL | Status: DC | PRN
  Administered 2018-01-23: 500 mL

## 2018-01-23 MED ORDER — METOCLOPRAMIDE HCL 10 MG PO TABS: 5.0000 mg | ORAL_TABLET | Freq: Three times a day (TID) | ORAL | Status: DC | PRN

## 2018-01-23 MED ORDER — ENOXAPARIN SODIUM 30 MG/0.3ML ~~LOC~~ SOLN: 30.0000 mg | SUBCUTANEOUS | Status: DC

## 2018-01-23 MED ORDER — ESMOLOL HCL 100 MG/10ML IV SOLN
INTRAVENOUS | Status: DC | PRN
  Administered 2018-01-23 (×2): 30 mg via INTRAVENOUS

## 2018-01-23 MED ORDER — EPINEPHRINE PF 1 MG/ML IJ SOLN
INTRAMUSCULAR | Status: DC | PRN
  Administered 2018-01-23: 1 mg via INTRAVENOUS

## 2018-01-23 MED ORDER — SUGAMMADEX SODIUM 200 MG/2ML IV SOLN
INTRAVENOUS | Status: DC | PRN
  Administered 2018-01-23: 50 mg via INTRAVENOUS
  Administered 2018-01-23: 100 mg via INTRAVENOUS
  Administered 2018-01-23: 50 mg via INTRAVENOUS

## 2018-01-23 MED ORDER — LACTATED RINGERS IV SOLN
INTRAVENOUS | Status: DC
  Administered 2018-01-23 (×2): via INTRAVENOUS

## 2018-01-23 MED ORDER — PHENOL 1.4 % MT LIQD
1.0000 | OROMUCOSAL | Status: DC | PRN
  Filled 2018-01-23: qty 177

## 2018-01-23 MED ORDER — BUPIVACAINE-EPINEPHRINE (PF) 0.5% -1:200000 IJ SOLN
INTRAMUSCULAR | Status: DC | PRN
  Administered 2018-01-23: 30 mL via PERINEURAL

## 2018-01-23 MED ORDER — FENTANYL CITRATE (PF) 100 MCG/2ML IJ SOLN
INTRAMUSCULAR | Status: DC | PRN
  Administered 2018-01-23 (×2): 25 ug via INTRAVENOUS
  Administered 2018-01-23: 50 ug via INTRAVENOUS
  Administered 2018-01-23: 25 ug via INTRAVENOUS

## 2018-01-23 MED ORDER — CHLORHEXIDINE GLUCONATE 4 % EX LIQD
60.0000 mL | Freq: Once | CUTANEOUS | Status: DC
  Filled 2018-01-23: qty 60

## 2018-01-23 MED ORDER — POVIDONE-IODINE 10 % EX SWAB: 2.0000 "application " | Freq: Once | CUTANEOUS | Status: DC

## 2018-01-23 MED ORDER — ROCURONIUM BROMIDE 50 MG/5ML IV SOSY
PREFILLED_SYRINGE | INTRAVENOUS | Status: DC | PRN
  Administered 2018-01-23: 50 mg via INTRAVENOUS

## 2018-01-23 MED ORDER — NOREPINEPHRINE BITARTRATE 1 MG/ML IV SOLN
INTRAVENOUS | Status: DC | PRN
  Administered 2018-01-23: 3 ug/min via INTRAVENOUS

## 2018-01-23 MED ORDER — ONDANSETRON HCL 4 MG PO TABS: 4.0000 mg | ORAL_TABLET | Freq: Four times a day (QID) | ORAL | Status: DC | PRN

## 2018-01-23 MED ORDER — FENTANYL CITRATE (PF) 100 MCG/2ML IJ SOLN
50.0000 ug | INTRAMUSCULAR | Status: DC | PRN
  Administered 2018-01-23 – 2018-01-24 (×2): 50 ug via INTRAVENOUS
  Filled 2018-01-23 (×4): qty 2

## 2018-01-23 MED ORDER — KETOROLAC TROMETHAMINE 30 MG/ML IJ SOLN
INTRAMUSCULAR | Status: DC | PRN
  Administered 2018-01-23: 30 mg

## 2018-01-23 MED ORDER — SENNA 8.6 MG PO TABS
1.0000 | ORAL_TABLET | Freq: Two times a day (BID) | ORAL | Status: DC
  Administered 2018-01-23: 8.6 mg via ORAL
  Filled 2018-01-23 (×4): qty 1

## 2018-01-23 MED ORDER — DEXAMETHASONE SODIUM PHOSPHATE 10 MG/ML IJ SOLN
INTRAMUSCULAR | Status: DC | PRN
  Administered 2018-01-23: 10 mg via INTRAVENOUS

## 2018-01-23 MED ORDER — SODIUM CHLORIDE 0.9 % IR SOLN
Status: DC | PRN
  Administered 2018-01-23: 350 mL

## 2018-01-23 MED ORDER — NOREPINEPHRINE BITARTRATE 1 MG/ML IV SOLN: INTRAVENOUS | Status: DC | PRN

## 2018-01-23 MED ORDER — CLINDAMYCIN PHOSPHATE 900 MG/50ML IV SOLN
INTRAVENOUS | Status: AC
  Filled 2018-01-23: qty 50

## 2018-01-23 MED ORDER — ORAL CARE MOUTH RINSE
15.0000 mL | OROMUCOSAL | Status: DC
  Administered 2018-01-23 – 2018-01-25 (×15): 15 mL via OROMUCOSAL

## 2018-01-23 MED ORDER — KETOROLAC TROMETHAMINE 30 MG/ML IJ SOLN
INTRAMUSCULAR | Status: AC
  Filled 2018-01-23: qty 1

## 2018-01-23 MED ORDER — ACETAMINOPHEN 325 MG PO TABS: 325.0000 mg | ORAL_TABLET | Freq: Four times a day (QID) | ORAL | Status: DC | PRN

## 2018-01-23 MED ORDER — CLINDAMYCIN PHOSPHATE 600 MG/50ML IV SOLN
600.0000 mg | Freq: Four times a day (QID) | INTRAVENOUS | Status: AC
  Administered 2018-01-23 – 2018-01-24 (×2): 600 mg via INTRAVENOUS
  Filled 2018-01-23 (×2): qty 50

## 2018-01-23 MED ORDER — SODIUM CHLORIDE 0.9 % IR SOLN
Status: DC | PRN
  Administered 2018-01-23: 3000 mL

## 2018-01-23 MED ORDER — ONDANSETRON HCL 4 MG/2ML IJ SOLN
INTRAMUSCULAR | Status: AC
  Filled 2018-01-23: qty 2

## 2018-01-23 MED ORDER — PHENYLEPHRINE 40 MCG/ML (10ML) SYRINGE FOR IV PUSH (FOR BLOOD PRESSURE SUPPORT)
PREFILLED_SYRINGE | INTRAVENOUS | Status: DC | PRN
  Administered 2018-01-23: 160 ug via INTRAVENOUS
  Administered 2018-01-23 (×2): 120 ug via INTRAVENOUS
  Administered 2018-01-23 (×2): 80 ug via INTRAVENOUS
  Administered 2018-01-23: 120 ug via INTRAVENOUS
  Administered 2018-01-23 (×4): 80 ug via INTRAVENOUS
  Administered 2018-01-23: 120 ug via INTRAVENOUS
  Administered 2018-01-23: 80 ug via INTRAVENOUS

## 2018-01-23 MED ORDER — PHENYLEPHRINE 40 MCG/ML (10ML) SYRINGE FOR IV PUSH (FOR BLOOD PRESSURE SUPPORT)
PREFILLED_SYRINGE | INTRAVENOUS | Status: AC
  Filled 2018-01-23: qty 10

## 2018-01-23 MED ORDER — DEXAMETHASONE SODIUM PHOSPHATE 10 MG/ML IJ SOLN
INTRAMUSCULAR | Status: AC
  Filled 2018-01-23: qty 1

## 2018-01-23 MED ORDER — SODIUM CHLORIDE 0.9 % IJ SOLN
INTRAMUSCULAR | Status: DC | PRN
  Administered 2018-01-23: 30 mL

## 2018-01-23 MED ORDER — VASOPRESSIN 20 UNIT/ML IV SOLN
INTRAVENOUS | Status: DC | PRN
  Administered 2018-01-23 (×4): 1 [IU] via INTRAVENOUS

## 2018-01-23 MED ORDER — CLINDAMYCIN PHOSPHATE 900 MG/50ML IV SOLN
900.0000 mg | INTRAVENOUS | Status: AC
  Administered 2018-01-23: 900 mg via INTRAVENOUS

## 2018-01-23 MED ORDER — BUPIVACAINE-EPINEPHRINE 0.5% -1:200000 IJ SOLN
INTRAMUSCULAR | Status: AC
  Filled 2018-01-23: qty 1

## 2018-01-23 MED ORDER — HYDROCODONE-ACETAMINOPHEN 5-325 MG PO TABS: 1.0000 | ORAL_TABLET | ORAL | Status: DC | PRN

## 2018-01-23 MED ORDER — METOPROLOL TARTRATE 5 MG/5ML IV SOLN
INTRAVENOUS | Status: DC | PRN
  Administered 2018-01-23 (×2): 3 mg via INTRAVENOUS
  Administered 2018-01-23 (×2): 2 mg via INTRAVENOUS

## 2018-01-23 MED ORDER — ONDANSETRON HCL 4 MG/2ML IJ SOLN
INTRAMUSCULAR | Status: DC | PRN
  Administered 2018-01-23: 4 mg via INTRAVENOUS

## 2018-01-23 MED ORDER — HEPARIN (PORCINE) IN NACL 100-0.45 UNIT/ML-% IJ SOLN: 700.0000 [IU]/h | INTRAMUSCULAR | Status: DC

## 2018-01-23 MED ORDER — VASOPRESSIN 20 UNIT/ML IV SOLN
INTRAVENOUS | Status: AC
  Filled 2018-01-23: qty 1

## 2018-01-23 MED ORDER — HYDROCODONE-ACETAMINOPHEN 7.5-325 MG PO TABS: 1.0000 | ORAL_TABLET | ORAL | Status: DC | PRN

## 2018-01-23 MED ORDER — HEPARIN (PORCINE) IN NACL 100-0.45 UNIT/ML-% IJ SOLN
700.0000 [IU]/h | INTRAMUSCULAR | Status: DC
  Administered 2018-01-23: 700 [IU]/h via INTRAVENOUS
  Filled 2018-01-23: qty 250

## 2018-01-23 MED ORDER — CALCIUM CHLORIDE 10 % IV SOLN
INTRAVENOUS | Status: DC | PRN
  Administered 2018-01-23: 1 g via INTRAVENOUS

## 2018-01-23 MED ORDER — FAMOTIDINE IN NACL 20-0.9 MG/50ML-% IV SOLN
20.0000 mg | INTRAVENOUS | Status: DC
  Administered 2018-01-23 – 2018-01-24 (×2): 20 mg via INTRAVENOUS
  Filled 2018-01-23 (×2): qty 50

## 2018-01-23 MED ORDER — DOCUSATE SODIUM 100 MG PO CAPS
100.0000 mg | ORAL_CAPSULE | Freq: Two times a day (BID) | ORAL | Status: DC
  Filled 2018-01-23 (×4): qty 1

## 2018-01-23 MED ORDER — SODIUM CHLORIDE 0.9 % IV SOLN
INTRAVENOUS | Status: DC | PRN
  Administered 2018-01-23: 40 ug/min via INTRAVENOUS

## 2018-01-23 MED ORDER — ONDANSETRON HCL 4 MG/2ML IJ SOLN: 4.0000 mg | Freq: Four times a day (QID) | INTRAMUSCULAR | Status: DC | PRN

## 2018-01-23 MED ORDER — ESMOLOL HCL 100 MG/10ML IV SOLN
INTRAVENOUS | Status: AC
  Filled 2018-01-23: qty 10

## 2018-01-23 MED ORDER — ROCURONIUM BROMIDE 50 MG/5ML IV SOSY
PREFILLED_SYRINGE | INTRAVENOUS | Status: AC
  Filled 2018-01-23: qty 5

## 2018-01-23 MED ORDER — DILTIAZEM HCL-DEXTROSE 100-5 MG/100ML-% IV SOLN (PREMIX)
5.0000 mg/h | INTRAVENOUS | Status: DC
  Filled 2018-01-23: qty 100

## 2018-01-23 MED ORDER — DILTIAZEM HCL 100 MG IV SOLR
INTRAVENOUS | Status: DC | PRN
  Administered 2018-01-23: 5 mg/h via INTRAVENOUS

## 2018-01-23 MED ORDER — CHLORHEXIDINE GLUCONATE 0.12% ORAL RINSE (MEDLINE KIT)
15.0000 mL | Freq: Two times a day (BID) | OROMUCOSAL | Status: DC
  Administered 2018-01-23 – 2018-01-24 (×2): 15 mL via OROMUCOSAL

## 2018-01-23 MED ORDER — METOCLOPRAMIDE HCL 5 MG/ML IJ SOLN
5.0000 mg | Freq: Three times a day (TID) | INTRAMUSCULAR | Status: DC | PRN
  Filled 2018-01-23: qty 2

## 2018-01-23 MED ORDER — FENTANYL CITRATE (PF) 100 MCG/2ML IJ SOLN: 25.0000 ug | INTRAMUSCULAR | Status: DC | PRN

## 2018-01-23 MED ORDER — WARFARIN - PHARMACIST DOSING INPATIENT: Freq: Every day | Status: DC

## 2018-01-23 MED ORDER — LACTATED RINGERS IV SOLN
INTRAVENOUS | Status: DC | PRN
  Administered 2018-01-23: 14:00:00 via INTRAVENOUS

## 2018-01-23 SURGICAL SUPPLY — 61 items
BLADE CLIPPER SURG (BLADE) IMPLANT
CHLORAPREP W/TINT 26ML (MISCELLANEOUS) ×2 IMPLANT
COVER SURGICAL LIGHT HANDLE (MISCELLANEOUS) ×2 IMPLANT
COVER WAND RF STERILE (DRAPES) ×1 IMPLANT
DERMABOND ADVANCED (GAUZE/BANDAGES/DRESSINGS) ×2
DERMABOND ADVANCED .7 DNX12 (GAUZE/BANDAGES/DRESSINGS) ×2 IMPLANT
DRAPE C-ARM 42X72 X-RAY (DRAPES) ×2 IMPLANT
DRAPE IMP U-DRAPE 54X76 (DRAPES) ×2 IMPLANT
DRAPE STERI IOBAN 125X83 (DRAPES) ×2 IMPLANT
DRAPE U-SHAPE 47X51 STRL (DRAPES) ×6 IMPLANT
DRESSING PREVENA PLUS CUSTOM (GAUZE/BANDAGES/DRESSINGS) IMPLANT
DRSG AQUACEL AG ADV 3.5X10 (GAUZE/BANDAGES/DRESSINGS) ×1 IMPLANT
DRSG PREVENA PLUS CUSTOM (GAUZE/BANDAGES/DRESSINGS) ×4
ELECT BLADE 4.0 EZ CLEAN MEGAD (MISCELLANEOUS) ×2
ELECT REM PT RETURN 9FT ADLT (ELECTROSURGICAL) ×4
ELECTRODE BLDE 4.0 EZ CLN MEGD (MISCELLANEOUS) ×1 IMPLANT
ELECTRODE REM PT RTRN 9FT ADLT (ELECTROSURGICAL) ×1 IMPLANT
EVACUATOR 1/8 PVC DRAIN (DRAIN) IMPLANT
GLOVE BIO SURGEON STRL SZ8.5 (GLOVE) ×4 IMPLANT
GLOVE BIOGEL PI IND STRL 8.5 (GLOVE) ×1 IMPLANT
GLOVE BIOGEL PI INDICATOR 8.5 (GLOVE) ×1
GOWN STRL REUS W/ TWL LRG LVL3 (GOWN DISPOSABLE) ×2 IMPLANT
GOWN STRL REUS W/TWL 2XL LVL3 (GOWN DISPOSABLE) ×3 IMPLANT
GOWN STRL REUS W/TWL LRG LVL3 (GOWN DISPOSABLE) ×2
HANDPIECE INTERPULSE COAX TIP (DISPOSABLE) ×1
HEAD FEM UNIPOLAR 47 OD STRL (Hips) ×1 IMPLANT
HOOD PEEL AWAY FLYTE STAYCOOL (MISCELLANEOUS) ×4 IMPLANT
KIT BASIN OR (CUSTOM PROCEDURE TRAY) ×2 IMPLANT
KIT TURNOVER KIT B (KITS) ×2 IMPLANT
MANIFOLD NEPTUNE II (INSTRUMENTS) ×2 IMPLANT
MARKER SKIN DUAL TIP RULER LAB (MISCELLANEOUS) ×2 IMPLANT
NDL SPNL 18GX3.5 QUINCKE PK (NEEDLE) ×1 IMPLANT
NEEDLE SPNL 18GX3.5 QUINCKE PK (NEEDLE) ×2 IMPLANT
NS IRRIG 1000ML POUR BTL (IV SOLUTION) ×2 IMPLANT
PACK TOTAL JOINT (CUSTOM PROCEDURE TRAY) ×2 IMPLANT
PACK UNIVERSAL I (CUSTOM PROCEDURE TRAY) ×2 IMPLANT
PAD ARMBOARD 7.5X6 YLW CONV (MISCELLANEOUS) ×2 IMPLANT
SAW OSC TIP CART 19.5X105X1.3 (SAW) ×2 IMPLANT
SEALER BIPOLAR AQUA 6.0 (INSTRUMENTS) ×1 IMPLANT
SET HNDPC FAN SPRY TIP SCT (DISPOSABLE) ×1 IMPLANT
SPACER DEPUY (Hips) IMPLANT
SPACER FEM TAPERED +0 12/14 (Hips) ×1 IMPLANT
STEM FEM CMNTLSS LG AML 15.0 (Hips) ×1 IMPLANT
STEM TRI LOC GRIPTION SZ 6 STD IMPLANT
SUCTION FRAZIER HANDLE 10FR (MISCELLANEOUS) ×1
SUCTION TUBE FRAZIER 10FR DISP (MISCELLANEOUS) ×1 IMPLANT
SUT ETHIBOND NAB CT1 #1 30IN (SUTURE) ×4 IMPLANT
SUT ETHILON 3 0 PS 1 (SUTURE) ×2 IMPLANT
SUT MNCRL AB 3-0 PS2 18 (SUTURE) ×2 IMPLANT
SUT MON AB 2-0 CT1 36 (SUTURE) ×3 IMPLANT
SUT VIC AB 1 CT1 27 (SUTURE) ×1
SUT VIC AB 1 CT1 27XBRD ANBCTR (SUTURE) ×1 IMPLANT
SUT VIC AB 2-0 CT1 27 (SUTURE) ×1
SUT VIC AB 2-0 CT1 TAPERPNT 27 (SUTURE) ×1 IMPLANT
SUT VLOC 180 0 24IN GS25 (SUTURE) ×2 IMPLANT
SYR 50ML LL SCALE MARK (SYRINGE) ×2 IMPLANT
TOWEL OR 17X24 6PK STRL BLUE (TOWEL DISPOSABLE) ×2 IMPLANT
TOWEL OR 17X26 10 PK STRL BLUE (TOWEL DISPOSABLE) ×2 IMPLANT
TRAY FOLEY CATH SILVER 16FR (SET/KITS/TRAYS/PACK) IMPLANT
TRI LOC GRIPTION SZ 6 STD IMPLANT
WATER STERILE IRR 1000ML POUR (IV SOLUTION) ×5 IMPLANT

## 2018-01-23 NOTE — Progress Notes (Signed)
Initial Nutrition Assessment  DOCUMENTATION CODES:   Not applicable  INTERVENTION:   -If unable to extubate within 48 hours, recommend:  Initiate TF via OGT with Vital AF 1.2 at goal rate of 40 ml/h (960 ml per day) to provide 1152 kcals, 72 gm protein, 779 ml free water daily.  NUTRITION DIAGNOSIS:   Inadequate oral intake related to inability to eat as evidenced by NPO status.  GOAL:   Patient will meet greater than or equal to 90% of their needs  MONITOR:   Vent status, Labs, Weight trends, Skin, I & O's  REASON FOR ASSESSMENT:   Ventilator, Consult Hip fracture protocol  ASSESSMENT:   Tonya Farley is a 84yo F admitted for right hip fracture following a fall at SNF. She was in AFib with RVR in the ED, improved with bolus of cardizem, and went to the OR for arthroplasty 11/1. After discussion with the HCPOA by anesthesia, her DNR was lifted perioperatively. She did require neo intraoperatively and brief resuscitation (~30 seconds) post operatively prior to ROSC and transfer to ICU.  11/1- s/p Right hip hemiarthroplasty, anterior approach, application of incisional wound VAC, intubated  Patient is currently intubated on ventilator support. OGT noted.  MV: 8.9 L/min Last recorded temp: 36.7 C  Pt receiving nursing care at time of visit. No family to provide history and no SNF records available to review. PTA pt was a resident of [address],  Reviewed wt hx; pt has experienced a1.5% wt loss over the past 3 months, which is not significant for time frame.  Pt just came to ICU after arthroplasty earlier today. No plans to TF today.   Labs reviewed.   NUTRITION - FOCUSED PHYSICAL EXAM:    Most Recent Value  Orbital Region  No depletion  Upper Arm Region  No depletion  Thoracic and Lumbar Region  Unable to assess  Buccal Region  No depletion  Temple Region  No depletion  Clavicle Bone Region  Unable to assess  Clavicle and Acromion Bone Region  Unable to  assess  Scapular Bone Region  Unable to assess  Dorsal Hand  No depletion  Patellar Region  No depletion  Anterior Thigh Region  No depletion  Posterior Calf Region  No depletion  Edema (RD Assessment)  None  Hair  Reviewed  Eyes  Reviewed  Mouth  Reviewed  Skin  Reviewed  Nails  Reviewed       Diet Order:   Diet Order            Diet NPO time specified  Diet effective midnight              EDUCATION NEEDS:   Not appropriate for education at this time  Skin:  Skin Assessment: Skin Integrity Issues: Skin Integrity Issues:: Wound VAC Wound Vac: rt hip incision  Last BM:  PTA  Height:   Ht Readings from Last 1 Encounters:  02-16-2018 5' 5.98" (1.676 m)    Weight:   Wt Readings from Last 1 Encounters:  02-16-2018 57.2 kg    Ideal Body Weight:  59.1 kg  BMI:  Body mass index is 20.36 kg/m.  Estimated Nutritional Needs:   Kcal:  1139  Protein:  70-85 grams  Fluid:  >1.1 L    Tonya Farley A. Mayford Knife, RD, LDN, CDE Pager: [phone] After hours Pager: [phone]

## 2018-01-23 NOTE — Anesthesia Procedure Notes (Addendum)
Central Venous Catheter Insertion Performed by: Elmer Picker, MD, anesthesiologist Start/End11/29/2019 1:40 PM, 02/02/2018 2:00 PM Patient location: Pre-op. Preanesthetic checklist: patient identified, IV checked, site marked, risks and benefits discussed, surgical consent, monitors and equipment checked, pre-op evaluation, timeout performed and anesthesia consent Position: Trendelenburg Lidocaine 1% used for infiltration and patient sedated Hand hygiene performed , maximum sterile barriers used  and Seldinger technique used Catheter size: 8 Fr Total catheter length 16. Central line was placed.Double lumen PA Cath depth:16 Procedure performed using ultrasound guided technique. Ultrasound Notes:anatomy identified, needle tip was noted to be adjacent to the nerve/plexus identified, no ultrasound evidence of intravascular and/or intraneural injection and image(s) printed for medical record Attempts: 1 Following insertion, dressing applied, line sutured and Biopatch. Post procedure assessment: blood return through all ports and free fluid flow  Patient tolerated the procedure well with no immediate complications.

## 2018-01-23 NOTE — Progress Notes (Signed)
[facility] PROGRESS NOTE  KENORA SPAYD  ZOX:[id number] DOB: [birth year] DOA: 01/19/2018 PCP: Burton Apley, MD  Brief Narrative: MARCEL SORTER is a 84yo F admitted for right hip fracture following a fall at SNF. She was in AFib with RVR in the ED, improved with bolus of cardizem, and went to the OR for arthroplasty 11/1. After discussion with the HCPOA by anesthesia, her DNR was lifted perioperatively. She did require neo intraoperatively and brief resuscitation (~30 seconds) post operatively prior to ROSC and transfer to ICU.  Subjective: This morning she reported no pain when not moving, otherwise, severe pain in right hip. No chest pain, dyspnea, palpitations.   Objective: BP 102/69   Pulse (!) 127   Temp 98.1 F (36.7 C) (Oral)   Resp 18   Ht 5' 5.98" (1.676 m)   Wt 57.2 kg   SpO2 100%   BMI 20.36 kg/m   Gen: Elderly in no distress Pulm: Clear and nonlabored on supplemental oxygen  CV: Rapid irregular, rate hovering 100-110bpm, no JVD or edema.  GI: Soft, NT, ND, +BS  Neuro: Alert, oriented to place, person and time. No focal deficits. Ext: RLE everted, pain with hip ROM. Foot with 5/5 strength, intact sensation palpable pulses. Skin: No rashes, lesions or ulcers  Assessment & Plan: Principal Problem:   Closed right hip fracture, initial encounter (HCC) Active Problems:   Hypertension   Chronic systolic CHF (congestive heart failure) (HCC), EF 35%   CKD (chronic kidney disease) stage 3, GFR 30-59 ml/min (HCC)   Pulmonary hypertension, moderate to severe (HCC)   Atrial fibrillation with RVR (HCC)   Hypothyroidism, adult   Hip fracture (HCC)   Closed displaced fracture of right femoral neck (HCC)  Right hip fracture: s/p arthroplasty 11/1.  - Per orthopedic surgery, pending clinical trajectory.  Cardiac arrest: Perioperative, now on levophed, intubated. Signed out with Dr. Tonia Brooms, CCM, who will assume care. Planning echo.  Asymmetric pupils, AMS:  -  STAT head CT for prognostic information  AFib with RVR: With HFrEF, would aim to attempt rate control with beta blockers vs. CCB, though dilt was effective in ED.  - Rate control per CCM - Orthopedics recommended resuming coumadin tonight with lovenox bridge beginning 11/2.  Chronic HFrEF:  - Echo - Volume management per CCM  Stage III CKD:  - Monitor BMP closely  Acute hypoxic respiratory failure, history of bronchitis:  - Vent management per CCM  Hypothyroidism: TSH 0.7 - Continue synthroid  Tyrone Nine, MD [facility] [email or web address] Password [email or web address] 02/02/2018, 2:48 PM

## 2018-01-23 NOTE — Anesthesia Procedure Notes (Signed)
Arterial Line Insertion Start/End11/03/2017 1:04 PM Performed by: Adria Dill, CRNA, CRNA  Patient sedated Left, radial was placed Catheter size: 20 G Hand hygiene performed   Attempts: 1 Procedure performed without using ultrasound guided technique. Following insertion, dressing applied and Biopatch. Post procedure assessment: normal and unchanged  Patient tolerated the procedure well with no immediate complications.

## 2018-01-23 NOTE — Consult Note (Signed)
NAME:  Tonya Farley, MRN:  [id number], DOB:  Sep 24, 1919, LOS: 1 ADMISSION DATE:  01/18/2018, CONSULTATION DATE: 01/27/2018  REFERRING MD:  Dr. Jarvis Newcomer, CHIEF COMPLAINT: Post cardiac arrest  Brief History   84 year old female, fall at skilled nursing facility sustaining a right hip fracture.  She was taken to the operating room, rescinded DNR, developed hypotension post surgery requiring Neo-Synephrine, subsequently had brief cardiac arrest less than 30 seconds, 1 round of epinephrine and chest compressions patient had return of spontaneous circulation and transferred to the intensive care unit.  Past Medical History   Past Medical History:  Diagnosis Date  . Chronic a-fib   . Chronic back pain   . Chronic systolic (congestive) heart failure (HCC)    reduced EF since 2018 to 35-40%  . CKD (chronic kidney disease) stage 4, GFR 15-29 ml/min (HCC) 09/11/2015  . Hyperlipidemia   . Hypertension   . Hypothyroidism   . Pulmonary hypertension (HCC)    PASP 65 mmHg at Echo 3/17  . Stroke (HCC)   . Thyroid disease     Significant Hospital Events   02/17/2018: Perioperative cardiac arrest, 30 seconds of CPR, epinephrine x1 with ROSC  Consults: date of consult/date signed off & final recs:  02/01/2018: Critical care consultation, ICU admission  Procedures (surgical and bedside):  02/15/2018: Right hemi-arthroplasty, hip 02/02/2018: Right internal jugular venous line placement by anesthesia 02/16/2018: Right radial arterial line by anesthesia  Significant Diagnostic Tests:   Echocardiogram 10/07/2017: Reduced ejection fraction, 30 to 35%, diffuse hypokinesis, severely elevated pulmonary artery pressures, peak systolic pressure 71.  CT head 02/04/2018: Pending Echocardiogram 02/03/2018: Pending  Micro Data:  None  Antimicrobials:  Peri-op Clindamycin   Subjective:  Intubated, sedated on mechanical ventilation postop  Objective   Blood pressure 102/69, pulse (!) 127, temperature 98.1  F (36.7 C), temperature source Oral, resp. rate 18, height 5' 5.98" (1.676 m), weight 57.2 kg, SpO2 100 %.    Vent Mode: PRVC FiO2 (%):  [100 %] 100 % Set Rate:  [20 bmp] 20 bmp Vt Set:  [470 mL] 470 mL Plateau Pressure:  [9 cmH20] 9 cmH20   Intake/Output Summary (Last 24 hours) at 02/03/2018 1504 Last data filed at 01/28/2018 1403 Gross per 24 hour  Intake 1250 ml  Output 100 ml  Net 1150 ml   Filed Weights   02/15/2018 0957  Weight: 57.2 kg    Examination: General: Elderly female, intubated on mechanical ventilation HENT: NCAT, sclera clear, left pupil 4 mm minimally reactive, right pupil 4 mm very reactive, bilateral cataract replacement with lens.  Endotracheal tube in place Lungs: Bilateral ventilated breath sounds, no crackles, no wheeze Cardiovascular: Irregularly irregular, tachycardic, S1-S2 Abdomen: Soft, nontender, nondistended bowel sounds present Extremities: Cool extremities, arterial line in the wrist Neuro: Withdraws to pain, localizes to pain, not following commands  Resolved Hospital Problem list    Assessment & Plan:   Cardiac arrest, subsequent cardiogenic shock, atrial fibrillation with RVR, acute hypoxemic respiratory failure requiring intubation mechanical ventilation, known prior reduced systolic function, now with acute on chronic systolic heart failure, likely component of chronic biventricular failure, last echocardiogram with dilatation of the RV and severely elevated pulmonary pressures with a peak systolic pressure of 71.    -Patient was a DNR prior to the procedure however this was rescinded.  At this point the patient is on life support with central venous access and has already suffered a cardiac arrest.  Therefore most of the worst things have already occurred  and we will continue further investigation in the intensive care unit to see if any of these are reversible. -Check troponins, trend over the next few hours -Check lactate - Check stat head  CT following cardiac arrest and history of atrial fibrillation - Check echocardiogram, suspect similarities from prior echo however at risk for significantly depressed ejection fraction and myocardial stunning post cardiac arrest -Titrate norepinephrine to maintain mean arterial pressure greater than 65 -I suspect her arrest was related to prior cardiac disease coming out of anesthesia for procedure. -Patient has CKD 3 at baseline, serum creatinine 1.5 unable to obtain CTA to rule out PE, will obtain bilateral lower extremity duplex.  Overall will not change management at this point as she will continue IV heparin pending head CT results.  Additional comorbidities being managed: Acute on chronic systolic heart failure -Attempt to maintain euvolemia we will monitor electrolytes Pulmonary hypertension  Atrial fibrillation with RVR - Her rate may be more elevated while on norepinephrine due to beta agonism. - Patient was on Coumadin prior to surgery. - Pending head CT results will start heparin GGT  Right hip fracture status post hemiarthroplasty by orthopedic surgery - Postop care per orthopedic surgery  CKD stage III -We will monitor fluid status -Monitor renal function daily   Disposition / Summary of Today's Plan 02/09/2018   Stat head CT, trending labs, echo pending     Diet: N.p.o. Pain/Anxiety/Delirium protocol (if indicated): Ordered VAP protocol (if indicated): Yes DVT prophylaxis: Heparin GI prophylaxis: H2 blocker Hyperglycemia protocol: None Mobility: Bedrest Code Status: DNR Family Communication: Updated family in waiting room   Labs   CBC: Recent Labs  Lab 01-24-18 1702 02/12/2018 0256 02/15/2018 1308  WBC 10.9* 10.2  --   NEUTROABS 8.9*  --   --   HGB 12.1 10.9* 10.9*  HCT 41.5 35.8* 32.0*  MCV 89.6 89.1  --   PLT 190 146*  --     Basic Metabolic Panel: Recent Labs  Lab 2018/01/24 1702 02/21/2018 0256 02/20/2018 1308  NA 140 137 138  K 3.9 3.9 5.8*  CL  100 105  --   CO2 25 26  --   GLUCOSE 104* 106*  --   BUN 27* 26*  --   CREATININE 1.51* 1.50*  --   CALCIUM 9.5 8.7*  --    GFR: Estimated Creatinine Clearance: 19.8 mL/min (A) (by C-G formula based on SCr of 1.5 mg/dL (H)). Recent Labs  Lab 01-24-18 1702 02/03/2018 0256  WBC 10.9* 10.2    Liver Function Tests: No results for input(s): AST, ALT, ALKPHOS, BILITOT, PROT, ALBUMIN in the last 168 hours. No results for input(s): LIPASE, AMYLASE in the last 168 hours. No results for input(s): AMMONIA in the last 168 hours.  ABG    Component Value Date/Time   PHART 7.241 (L) 02/10/2018 1308   PCO2ART 34.7 01/28/2018 1308   PO2ART 94.0 02/19/2018 1308   HCO3 14.9 (L) 02/21/2018 1308   TCO2 16 (L) 02/17/2018 1308   ACIDBASEDEF 12.0 (H) 02/01/2018 1308   O2SAT 96.0 01/29/2018 1308     Coagulation Profile: Recent Labs  Lab January 24, 2018 1702 01/28/2018 0623  INR 1.51 1.58    Cardiac Enzymes: Recent Labs  Lab 02/04/2018 0011  TROPONINI <0.03    HbA1C: Hgb A1c MFr Bld  Date/Time Value Ref Range Status  10/06/2017 02:26 PM 5.9 (H) 4.8 - 5.6 % Final    Comment:    (NOTE) Pre diabetes:  5.7%-6.4% Diabetes:              >6.4% Glycemic control for   <7.0% adults with diabetes   06/10/2015 11:47 AM 6.4 (H) 4.8 - 5.6 % Final    Comment:    (NOTE)         Pre-diabetes: 5.7 - 6.4         Diabetes: >6.4         Glycemic control for adults with diabetes: <7.0     CBG: No results for input(s): GLUCAP in the last 168 hours.  Admitting History of Present Illness.   Please see above  Review of Systems:   Unable to obtain secondary to critical illness, intubated mechanical ventilator.  Past Medical History  She,  has a past medical history of Chronic a-fib, Chronic back pain, Chronic systolic (congestive) heart failure (HCC), CKD (chronic kidney disease) stage 4, GFR 15-29 ml/min (HCC) (09/11/2015), Hyperlipidemia, Hypertension, Hypothyroidism, Pulmonary hypertension  (HCC), Stroke (HCC), and Thyroid disease.   Surgical History    Past Surgical History:  Procedure Laterality Date  . APPENDECTOMY    . cataracts     bilateral  . EYE SURGERY     left eye "hole"  . IR KYPHO THORACIC WITH BONE BIOPSY  09/26/2017  . IR RADIOLOGIST EVAL & MGMT  09/18/2017  . TONSILLECTOMY       Social History   Social History   Socioeconomic History  . Marital status: Widowed    Spouse name: Not on file  . Number of children: Not on file  . Years of education: Not on file  . Highest education level: Not on file  Occupational History  . Not on file  Social Needs  . Financial resource strain: Not on file  . Food insecurity:    Worry: Patient refused    Inability: Patient refused  . Transportation needs:    Medical: Not on file    Non-medical: Not on file  Tobacco Use  . Smoking status: Never Smoker  . Smokeless tobacco: Never Used  Substance and Sexual Activity  . Alcohol use: No  . Drug use: No  . Sexual activity: Not Currently    Birth control/protection: None  Lifestyle  . Physical activity:    Days per week: Not on file    Minutes per session: Not on file  . Stress: Not on file  Relationships  . Social connections:    Talks on phone: Not on file    Gets together: Not on file    Attends religious service: Not on file    Active member of club or organization: Not on file    Attends meetings of clubs or organizations: Not on file    Relationship status: Not on file  . Intimate partner violence:    Fear of current or ex partner: Not on file    Emotionally abused: Not on file    Physically abused: Not on file    Forced sexual activity: Not on file  Other Topics Concern  . Not on file  Social History Narrative  . Not on file  ,  reports that she has never smoked. She has never used smokeless tobacco. She reports that she does not drink alcohol or use drugs.   Family History   Her family history includes Cancer in her mother; Heart attack in  her father; Heart disease in her sister and sister; Other in her father.   Allergies Allergies  Allergen Reactions  . Penicillins  Hives and Swelling    Has patient had a PCN reaction causing immediate rash, facial/tongue/throat swelling, SOB or lightheadedness with hypotension: Yes Has patient had a PCN reaction causing severe rash involving mucus membranes or skin necrosis: Yes Has patient had a PCN reaction that required hospitalization No Has patient had a PCN reaction occurring within the last 10 years: No If all of the above answers are "NO", then may proceed with Cephalosporin use.    . Sulfa Antibiotics Anaphylaxis and Swelling  . Fish Allergy Nausea And Vomiting     Home Medications  Prior to Admission medications   Medication Sig Start Date End Date Taking? Authorizing Provider  metoprolol tartrate (LOPRESSOR) 25 MG tablet Take 0.5 tablets (12.5 mg total) by mouth daily. 11/12/17  Yes Dyann Kief, PA-C  allopurinol (ZYLOPRIM) 100 MG tablet  10/05/17   [provider]  atorvastatin (LIPITOR) 10 MG tablet Take 10 mg by mouth daily.    [provider]  B Complex Vitamins (VITAMIN B COMPLEX) TABS Take 1 tablet by mouth daily with lunch.    [provider]  brimonidine (ALPHAGAN) 0.15 % ophthalmic solution Place 1 drop into both eyes 3 (three) times daily.    [provider]  cholecalciferol (VITAMIN D) 400 units TABS tablet Take 400 Units by mouth daily.    [provider]  diltiazem (CARDIZEM CD) 120 MG 24 hr capsule Take 1 capsule (120 mg total) by mouth daily. 08/26/17   Corky Crafts, MD  furosemide (LASIX) 40 MG tablet Take 1 tablet (40 mg total) by mouth daily. 01/08/18   Dyann Kief, PA-C  guaiFENesin-dextromethorphan (ROBITUSSIN DM) 100-10 MG/5ML syrup Take 5 mLs by mouth every 4 (four) hours as needed for cough. 10/08/17   Rai, Delene Ruffini, MD  isosorbide mononitrate (IMDUR) 30 MG 24 hr tablet Take 1 tablet (30 mg  total) by mouth daily. 09/05/17   Corky Crafts, MD  latanoprost (XALATAN) 0.005 % ophthalmic solution Place 1 drop into both eyes at bedtime. 01/10/14   [provider]  levalbuterol Pauline Aus) 0.63 MG/3ML nebulizer solution Take 3 mLs (0.63 mg total) by nebulization 3 (three) times daily for 20 days. And q4hours as needed for SOB/wheezing Patient taking differently: Take 0.63 mg by nebulization See admin instructions. Take 3 mls by Nebulization three times daily for 20 days, then change to every 4 hours as needed for shortness of breath and wheezing 10/08/17 11/11/17  Rai, Delene Ruffini, MD  levothyroxine (SYNTHROID, LEVOTHROID) 88 MCG tablet Take 88 mcg by mouth daily before breakfast.    [provider]  mometasone-formoterol (DULERA) 100-5 MCG/ACT AERO Inhale 2 puffs into the lungs 2 (two) times daily. 10/08/17   Rai, Ripudeep K, MD  ondansetron (ZOFRAN) 4 MG tablet Take 4 mg by mouth every 4 (four) hours as needed for nausea or vomiting.    [provider]  Polyethyl Glycol-Propyl Glycol (SYSTANE OP) Apply 2 drops to eye daily as needed (dry eyes).     [provider]  potassium chloride SA (K-DUR,KLOR-CON) 20 MEQ tablet Take 1 tablet (20 mEq total) by mouth daily. 12/03/17   Dyann Kief, PA-C  spironolactone (ALDACTONE) 25 MG tablet TAKE 1/2 TABLET BY MOUTH ON TUESDAY, THURSDAY AND SATURDAY 11/11/17   Dyann Kief, PA-C  timolol (TIMOPTIC) 0.5 % ophthalmic solution Place 1 drop into both eyes daily.  12/28/13   [provider]  warfarin (COUMADIN) 1 MG tablet TAKE 2.5 TABLETS DAILY AS DIRECTED BY  COUMADIN CLINIC 10/31/17   [provider]  warfarin (COUMADIN) 2 MG tablet Take 2 mg by mouth at bedtime.    [provider]     This patient is critically ill with multiple organ system failure; which, requires frequent high complexity decision making, assessment, support, evaluation, and titration of therapies. This was completed  through the application of advanced monitoring technologies and extensive interpretation of multiple databases. During this encounter critical care time was devoted to patient care services described in this note for 55 minutes.  Josephine Igo, DO [facility] 02/04/2018 3:04 PM  Personal pager: [phone] If unanswered, please page CCM On-call: [phone]

## 2018-01-23 NOTE — Interval H&P Note (Signed)
History and Physical Interval Note:  02/01/2018 9:48 AM  Tonya Farley  has presented today for surgery, with the diagnosis of Right hip fx  The various methods of treatment have been discussed with the patient and family. After consideration of risks, benefits and other options for treatment, the patient has consented to  Procedure(s): ANTERIOR APPROACH HEMI HIP ARTHROPLASTY (Right) as a surgical intervention .  The patient's history has been reviewed, patient examined, no change in status, stable for surgery.  I have reviewed the patient's chart and labs.  Questions were answered to the patient's satisfaction.     [name]

## 2018-01-23 NOTE — ED Notes (Signed)
Attending at bedside.

## 2018-01-23 NOTE — OR Nursing (Signed)
OR Procedure concluded.  Patient coded during wake-up process, see Anesthesia note.  Molly Maduro, RN

## 2018-01-23 NOTE — Progress Notes (Addendum)
ANTICOAGULATION CONSULT NOTE - Initial Consult  Pharmacy Consult for warfarin >> heparin Indication: atrial fibrillation  Allergies  Allergen Reactions  . Penicillins Hives and Swelling    Has patient had a PCN reaction causing immediate rash, facial/tongue/throat swelling, SOB or lightheadedness with hypotension: Yes Has patient had a PCN reaction causing severe rash involving mucus membranes or skin necrosis: Yes Has patient had a PCN reaction that required hospitalization No Has patient had a PCN reaction occurring within the last 10 years: No If all of the above answers are "NO", then may proceed with Cephalosporin use.    . Sulfa Antibiotics Anaphylaxis and Swelling  . Fish Allergy Nausea And Vomiting    Patient Measurements: Height: 5' 5.98" (167.6 cm) Weight: 126 lb 1.7 oz (57.2 kg) IBW/kg (Calculated) : 59.26  Vital Signs: BP: 119/73 (11/01 0915) Pulse Rate: 85 (11/01 0915)  Labs: Recent Labs    01/03/2018 1702 02/20/2018 0011 01/28/2018 0256 02/05/2018 0623 02/19/2018 1308  HGB 12.1  --  10.9*  --  10.9*  HCT 41.5  --  35.8*  --  32.0*  PLT 190  --  146*  --   --   LABPROT 18.0*  --   --  18.7*  --   INR 1.51  --   --  1.58  --   CREATININE 1.51*  --  1.50*  --   --   TROPONINI  --  <0.03  --   --   --     Estimated Creatinine Clearance: 19.8 mL/min (A) (by C-G formula based on SCr of 1.5 mg/dL (H)).   Medical History: Past Medical History:  Diagnosis Date  . Chronic a-fib   . Chronic back pain   . Chronic systolic (congestive) heart failure (HCC)    reduced EF since 2018 to 35-40%  . CKD (chronic kidney disease) stage 4, GFR 15-29 ml/min (HCC) 09/11/2015  . Hyperlipidemia   . Hypertension   . Hypothyroidism   . Pulmonary hypertension (HCC)    PASP 65 mmHg at Echo 3/17  . Stroke (HCC)   . Thyroid disease      Assessment: 56 yoF on warfarin PTA for hx of AFib admitted with mechanical fall. Warfarin has been held on admit and pt is now s/p R hip  hemiarthroplasty and wound vac application. Pharmacy consulted to resume warfarin tonight with plans for enoxaparin bridge tomorrow. INR this morning 1.58.   *PTA Warfarin Dose = 2.5mg  Tues, 2mg  all other days  Goal of Therapy:  INR 2-3 Monitor platelets by anticoagulation protocol: Yes   Plan:  -Warfarin 2.5mg  PO x1 tonight -Daily protime -Monitor for S/Sx bleeding closely  ADDENDUM:  Pt decompensated post/op requiring intubation and brief CPR. Pharmacy to transition to IV heparin for now. Given recent surgery will target low goal and avoid bolus. Head CT negative for acute process.  Plan: -Hold warfarin for now -IV heparin 700 units/hr -Check 8hr heparin level and daily CBC   Fredonia Highland, PharmD, BCPS Clinical Pharmacist [phone] Please check AMION for all [facility] Pharmacy numbers 02/10/2018

## 2018-01-23 NOTE — Progress Notes (Signed)
Bedside RN requested assistance regarding foley catheter.  2 attempts at placement, 1L of urine in bladder on [place].  Tiny labial skin tear noted at 5 o'clock position.  Sterile technique utilized, bedside RN assisted.  Foley catheter placed with clear yellow urine output.    Canary Brim, NP-C [facility] Pgr: [phone] or if no answer [phone] 02/07/2018, 3:27 PM

## 2018-01-23 NOTE — Anesthesia Preprocedure Evaluation (Addendum)
Anesthesia Evaluation  Patient identified by MRN, date of birth, ID band Patient awake    Reviewed: Allergy & Precautions, NPO status , Patient's Chart, lab work & pertinent test results  Airway Mallampati: II  TM Distance: >3 FB Neck ROM: Full  Mouth opening: Limited Mouth Opening  Dental no notable dental hx. (+) Edentulous Upper, Edentulous Lower   Pulmonary neg pulmonary ROS,    Pulmonary exam normal breath sounds clear to auscultation       Cardiovascular hypertension, +CHF  + dysrhythmias (on coumadin, INR 1.5) Atrial Fibrillation  Rhythm:Irregular Rate:Normal  - Left ventricle: The cavity size was normal. Systolic function was moderately to severely reduced. The estimated ejection fraction was in the range of 30% to 35%. Diffuse hypokinesis. - Aortic valve: There was trivial regurgitation. - Mitral valve: Calcified annulus. Mildly thickened leaflets . - Left atrium: The atrium was severely dilated. - Right ventricle: The cavity size was mildly dilated. - Right atrium: The atrium was severely dilated. - Tricuspid valve: There was moderate-severe regurgitation. - Pulmonary arteries: Systolic pressure was severely increased. PA  peak pressure: 71 mm Hg (S). Impressions: - Moderate to severe global reduction in LV systolic function; trace AI; severe biatrial enlargement; mild RVE; moderate to severe TR with severe pulmonary hypertension.   Neuro/Psych CVA negative psych ROS   GI/Hepatic negative GI ROS, Neg liver ROS,   Endo/Other  Hypothyroidism   Renal/GU Renal diseaseCKD Stage IV  negative genitourinary   Musculoskeletal negative musculoskeletal ROS (+)   Abdominal   Peds  Hematology  (+) Blood dyscrasia, anemia ,   Anesthesia Other Findings Hip fracture s/p fall, code status reversed during the peri-operative period  Reproductive/Obstetrics                           Anesthesia  Physical Anesthesia Plan  ASA: III  Anesthesia Plan: General   Post-op Pain Management:    Induction: Intravenous  PONV Risk Score and Plan: 3 and Treatment may vary due to age or medical condition and Ondansetron  Airway Management Planned: Oral ETT  Additional Equipment:   Intra-op Plan:   Post-operative Plan: Extubation in OR and Possible Post-op intubation/ventilation  Informed Consent: I have reviewed the patients History and Physical, chart, labs and discussed the procedure including the risks, benefits and alternatives for the proposed anesthesia with the patient or authorized representative who has indicated his/her understanding and acceptance.   Dental advisory given  Plan Discussed with: CRNA  Anesthesia Plan Comments:       Anesthesia Quick Evaluation

## 2018-01-23 NOTE — Op Note (Addendum)
OPERATIVE REPORT  SURGEON: Samson Frederic, MD   ASSISTANT: Hart Carwin, RNFA.  PREOPERATIVE DIAGNOSIS: Displaced Right femoral neck fracture.   POSTOPERATIVE DIAGNOSIS: Displaced Right femoral neck fracture.   PROCEDURE: Right hip hemiarthroplasty, anterior approach.  Application of incisional wound VAC.  IMPLANTS: DePuy AML stem, size 15 x 160 mm, large stature, with a 0 mm spacer and a 47 mm monopolar head ball.  ANESTHESIA:  General  ANTIBIOTICS: 900 mg clindamycin.  ESTIMATED BLOOD LOSS:-100 mL    DRAINS: None.  COMPLICATIONS: Intraoperative fracture of greater trochanter.   CONDITION: ICU - intubated and critically ill.   BRIEF CLINICAL NOTE: Tonya Farley is a 84 y.o. female with a displaced Right femoral neck fracture. The patient was admitted to the hospitalist service and underwent perioperative risk stratification and medical optimization. The risks, benefits, and alternatives to hemiarthroplasty were explained, and the patient elected to proceed.  PROCEDURE IN DETAIL: The patient was taken to the operating room and general anesthesia was induced on the hospital bed. The patient was then positioned on the Hana table. All bony prominences were well padded. The hip was prepped and draped in the normal sterile surgical fashion. A time-out was called verifying side and site of surgery. Antibiotics were given within 60 minutes of beginning the procedure.  The direct anterior approach to the hip was performed through the Hueter interval. Lateral femoral circumflex vessels were treated with the Auqumantys. The anterior capsule was exposed and an inverted T capsulotomy was made. Fracture hematoma was encountered and evacuated. The patient was found to have a comminuted Right subcapital femoral neck fracture. I freshened the femoral neck cut with a saw. I removed the femoral neck fragment. A corkscrew was placed into the head and the head was removed. This was  passed to the back table and was measured.  Bone quality was poor.  Acetabular exposure was achieved. I examined the articular cartilage which was intact. The labrum was intact. A 47 mm trial head was placed and found to have excellent fit.  I then gained femoral exposure taking care to protect the abductors and greater trochanter. This was performed using standard external rotation, extension, and adduction. The capsule was peeled off the inner aspect of the greater trochanter, taking care to preserve the short external rotators. A cookie cutter was used to enter the femoral canal, and then the femoral canal finder was used to confirm location. I then sequentially broached for a Tri-Lock stem up to a size 6. Calcar planer was used on the femoral neck remnant. I paced a standard offset neck and a 36 + 1.5 head ball.The hip was reduced. Leg lengths were checked fluoroscopically. The hip was dislocated and trial components were removed. Upon removing the broach, the patient was noted to have a nondisplaced greater trochanter fracture.  Therefore, I elected to proceed with AML stem to gain purchase in the more distal bone.  I sequentially reamed up to a 14.5 mm reamer.  I then broached up to a 15 mm large stature stem.  Trial neck and +1.5 head ball was placed.  The hip was reduced.  Leg lengths were checked fluoroscopically.  The hip was dislocated and trial components were removed.  I placed the real stem followed by the real spacer and head ball. A single reduction maneuver was performed and the hip was reduced. Fluoroscopy was used to confirm component position and leg lengths. At 90 degrees of external rotation and extension, the hip was stable to an  anterior directed force.  The wound was copiously irrigated with normal saline solution. Marcaine solution was injected into the periarticular soft tissue. The wound was closed in layers using #1 Vicryl and V-Loc for the fascia, 2-0 Vicryl for  the subcutaneous fat, 2-0 Monocryl for the deep dermal layer, 2-0 nylon mattress suture for the skin. Prevena incisional wound VAC was applied according to manufacturer's instructions. Just prior to extubation, the patient had a brief cardiac arrest, requiring chest compressions and one round of epinephrine. Patient was then taken to the ICU in extubated but stable condition.  Please see anesthesia record for complete details. Sponge, needle, and instrument counts were correct at the end of the case x2.   Postoperatively, the patient be readmitted to the ICU.  Resume Coumadin tonight with Lovenox bridge beginning tomorrow morning.  She may weight-bear as tolerated with a walker.  No active abduction for 6 weeks.  She will work with physical and occupational therapy and undergo disposition planning.  We will convert her to a portable Prevena suction unit upon discharge.  She will need to return to the office 7 days after discharge for [facility] removal.

## 2018-01-23 NOTE — Anesthesia Procedure Notes (Signed)
Procedure Name: Intubation Date/Time: 01/29/2018 10:22 AM Performed by: Trinna Post., CRNA Pre-anesthesia Checklist: Patient identified, Emergency Drugs available, Suction available, Patient being monitored and Timeout performed Patient Re-evaluated:Patient Re-evaluated prior to induction Oxygen Delivery Method: Circle system utilized Preoxygenation: Pre-oxygenation with 100% oxygen Induction Type: IV induction Ventilation: Mask ventilation without difficulty Laryngoscope Size: Mac and 3 Grade View: Grade I Tube type: Oral Tube size: 7.0 mm Number of attempts: 1 Airway Equipment and Method: Stylet Placement Confirmation: ETT inserted through vocal cords under direct vision,  positive ETCO2 and breath sounds checked- equal and bilateral Secured at: 22 cm Tube secured with: Tape Dental Injury: Teeth and Oropharynx as per pre-operative assessment

## 2018-01-23 NOTE — Procedures (Signed)
Heart rate is too high at this time for accurate echo. 

## 2018-01-23 NOTE — Transfer of Care (Signed)
Immediate Anesthesia Transfer of Care Note  Patient: Tonya Farley  Procedure(s) Performed: ANTERIOR APPROACH HEMI HIP ARTHROPLASTY (Right Hip)  Patient Location: PACU and ICU  Anesthesia Type:General  Level of Consciousness: Patient remains intubated per anesthesia plan  Airway & Oxygen Therapy: Patient remains intubated per anesthesia plan  Post-op Assessment: Report given to RN and Post -op Vital signs reviewed and stable  Post vital signs: Reviewed and stable  Last Vitals:  Vitals Value Taken Time  BP 102/69 02/20/2018  2:19 PM  Temp    Pulse 122 02/11/2018  2:29 PM  Resp 17 02/15/2018  2:29 PM  SpO2 100 % 02/14/2018  2:29 PM  Vitals shown include unvalidated device data.  Last Pain:  Vitals:   02/14/2018 0634  TempSrc:   PainSc: 0-No pain         Complications: Pt coded at the end of surgical procedure and transferred to ICU for post-operative care

## 2018-01-23 NOTE — Discharge Instructions (Signed)
° °Dr. Ayanna Gheen °Joint Replacement Specialist °[facility] °[address] °[place],  [address] °[phone] ° ° °TOTAL HIP REPLACEMENT POSTOPERATIVE DIRECTIONS ° ° ° °Hip Rehabilitation, Guidelines Following Surgery  ° °WEIGHT BEARING °Weight bearing as tolerated with assist device (walker, cane, etc) as directed, use it as long as suggested by your surgeon or therapist, typically at least 4-6 weeks. ° °The results of a hip operation are greatly improved after range of motion and muscle strengthening exercises. Follow all safety measures which are given to protect your hip. If any of these exercises cause increased pain or swelling in your joint, decrease the amount until you are comfortable again. Then slowly increase the exercises. Call your caregiver if you have problems or questions.  ° °HOME CARE INSTRUCTIONS  °Most of the following instructions are designed to prevent the dislocation of your new hip.  °Remove items at home which could result in a fall. This includes throw rugs or furniture in walking pathways.  °Continue medications as instructed at time of discharge. °· You may have some home medications which will be placed on hold until you complete the course of blood thinner medication. °· You may start showering once you are discharged home. Do not remove your dressing. °Do not put on socks or shoes without following the instructions of your caregivers.   °Sit on chairs with arms. Use the chair arms to help push yourself up when arising.  °Arrange for the use of a toilet seat elevator so you are not sitting low.  °· Walk with walker as instructed.  °You may resume a sexual relationship in one month or when given the OK by your caregiver.  °Use walker as long as suggested by your caregivers.  °You may put full weight on your legs and walk as much as is comfortable. °Avoid periods of inactivity such as sitting longer than an hour when not asleep. This helps prevent  blood clots.  °You may return to work once you are cleared by your surgeon.  °Do not drive a car for 6 weeks or until released by your surgeon.  °Do not drive while taking narcotics.  °Wear elastic stockings for two weeks following surgery during the day but you may remove then at night.  °Make sure you keep all of your appointments after your operation with all of your doctors and caregivers. You should call the office at the above phone number and make an appointment for approximately two weeks after the date of your surgery. °Please pick up a stool softener and laxative for home use as long as you are requiring pain medications. °· ICE to the affected hip every three hours for 30 minutes at a time and then as needed for pain and swelling. Continue to use ice on the hip for pain and swelling from surgery. You may notice swelling that will progress down to the foot and ankle.  This is normal after surgery.  Elevate the leg when you are not up walking on it.   °It is important for you to complete the blood thinner medication as prescribed by your doctor. °· Continue to use the breathing machine which will help keep your temperature down.  It is common for your temperature to cycle up and down following surgery, especially at night when you are not up moving around and exerting yourself.  The breathing machine keeps your lungs expanded and your temperature down. ° °RANGE OF MOTION AND STRENGTHENING EXERCISES  °These exercises   are designed to help you keep full movement of your hip joint. Follow your caregiver's or physical therapist's instructions. Perform all exercises about fifteen times, three times per day or as directed. Exercise both hips, even if you have had only one joint replacement. These exercises can be done on a training (exercise) mat, on the floor, on a table or on a bed. Use whatever works the best and is most comfortable for you. Use music or television while you are exercising so that the exercises  are a pleasant break in your day. This will make your life better with the exercises acting as a break in routine you can look forward to.  Lying on your back, slowly slide your foot toward your buttocks, raising your knee up off the floor. Then slowly slide your foot back down until your leg is straight again.  Lying on your back spread your legs as far apart as you can without causing discomfort.  Lying on your side, raise your upper leg and foot straight up from the floor as far as is comfortable. Slowly lower the leg and repeat.  Lying on your back, tighten up the muscle in the front of your thigh (quadriceps muscles). You can do this by keeping your leg straight and trying to raise your heel off the floor. This helps strengthen the largest muscle supporting your knee.  Lying on your back, tighten up the muscles of your buttocks both with the legs straight and with the knee bent at a comfortable angle while keeping your heel on the floor.   SKILLED REHAB INSTRUCTIONS: If the patient is transferred to a skilled rehab facility following release from the hospital, a list of the current medications will be sent to the facility for the patient to continue.  When discharged from the skilled rehab facility, please have the facility set up the patient's Home Health Physical Therapy prior to being released. Also, the skilled facility will be responsible for providing the patient with their medications at time of release from the facility to include their pain medication and their blood thinner medication. If the patient is still at the rehab facility at time of the two week follow up appointment, the skilled rehab facility will also need to assist the patient in arranging follow up appointment in our office and any transportation needs.  MAKE SURE YOU:  Understand these instructions.  Will watch your condition.  Will get help right away if you are not doing well or get worse.  Pick up stool softner and  laxative for home use following surgery while on pain medications. Do not remove your dressing. Keep VAC dressing clean and dry. Charge VAC unit nightly. Continue to use ice for pain and swelling after surgery. Do not use any lotions or creams on the incision until instructed by your surgeon. Total Hip Protocol.

## 2018-01-23 DEATH — deceased

## 2018-01-24 DIAGNOSIS — R570 Cardiogenic shock: Secondary | ICD-10-CM

## 2018-01-24 LAB — CBC
HCT: 21.5 % — ABNORMAL LOW (ref 36.0–46.0)
HEMATOCRIT: 31.5 % — AB (ref 36.0–46.0)
HEMATOCRIT: 23.8 % — AB (ref 36.0–46.0)
HEMOGLOBIN: 9.2 g/dL — AB (ref 12.0–15.0)
Hemoglobin: 6.6 g/dL — CL (ref 12.0–15.0)
Hemoglobin: 7 g/dL — ABNORMAL LOW (ref 12.0–15.0)
MCH: 27.1 pg (ref 26.0–34.0)
MCH: 27.6 pg (ref 26.0–34.0)
MCH: 27.7 pg (ref 26.0–34.0)
MCHC: 29.2 g/dL — ABNORMAL LOW (ref 30.0–36.0)
MCHC: 30.7 g/dL (ref 30.0–36.0)
MCHC: 29.4 g/dL — AB (ref 30.0–36.0)
MCV: 92.9 fL (ref 80.0–100.0)
MCV: 90 fL (ref 80.0–100.0)
MCV: 94.1 fL (ref 80.0–100.0)
NRBC: 0.6 % — AB (ref 0.0–0.2)
NRBC: 0.4 % — AB (ref 0.0–0.2)
PLATELETS: 113 10*3/uL — AB (ref 150–400)
PLATELETS: 85 10*3/uL — AB (ref 150–400)
Platelets: 60 10*3/uL — ABNORMAL LOW (ref 150–400)
RBC: 3.39 MIL/uL — ABNORMAL LOW (ref 3.87–5.11)
RBC: 2.39 MIL/uL — ABNORMAL LOW (ref 3.87–5.11)
RBC: 2.53 MIL/uL — ABNORMAL LOW (ref 3.87–5.11)
RDW: 16.8 % — ABNORMAL HIGH (ref 11.5–15.5)
RDW: 16.7 % — ABNORMAL HIGH (ref 11.5–15.5)
RDW: 15.9 % — AB (ref 11.5–15.5)
WBC: 31 10*3/uL — ABNORMAL HIGH (ref 4.0–10.5)
WBC: 34.5 10*3/uL — AB (ref 4.0–10.5)
WBC: 33 10*3/uL — AB (ref 4.0–10.5)
nRBC: 0.5 % — ABNORMAL HIGH (ref 0.0–0.2)

## 2018-01-24 LAB — BLOOD GAS, ARTERIAL
ACID-BASE DEFICIT: 14.9 mmol/L — AB (ref 0.0–2.0)
ACID-BASE DEFICIT: 17.9 mmol/L — AB (ref 0.0–2.0)
BICARBONATE: 11.7 mmol/L — AB (ref 20.0–28.0)
Bicarbonate: 8.6 mmol/L — ABNORMAL LOW (ref 20.0–28.0)
Drawn by: 249101
FIO2: 90
FIO2: 100
LHR: 18 {breaths}/min
MECHVT: 470 mL
O2 SAT: 98.9 %
O2 Saturation: 99.3 %
PATIENT TEMPERATURE: 98.6
PEEP/CPAP: 5 cmH2O
PEEP/CPAP: 5 cmH2O
PH ART: 7.203 — AB (ref 7.350–7.450)
PO2 ART: 280 mmHg — AB (ref 83.0–108.0)
Patient temperature: 97.3
RATE: 16 resp/min
VT: 470 mL
pCO2 arterial: 31.3 mmHg — ABNORMAL LOW (ref 32.0–48.0)
pCO2 arterial: 22.7 mmHg — ABNORMAL LOW (ref 32.0–48.0)
pH, Arterial: 7.194 — CL (ref 7.350–7.450)
pO2, Arterial: 399 mmHg — ABNORMAL HIGH (ref 83.0–108.0)

## 2018-01-24 LAB — DIC (DISSEMINATED INTRAVASCULAR COAGULATION)PANEL
D-Dimer, Quant: >20 ug/mL-FEU — ABNORMAL HIGH (ref 0.00–0.50)
Fibrinogen: 142 mg/dL — ABNORMAL LOW (ref 210–475)
INR: >10
Prothrombin Time: >90 seconds — ABNORMAL HIGH (ref 11.4–15.2)
Smear Review: NONE SEEN

## 2018-01-24 LAB — BASIC METABOLIC PANEL
Anion gap: 36 — ABNORMAL HIGH (ref 5–15)
BUN: 35 mg/dL — AB (ref 8–23)
CO2: 16 mmol/L — ABNORMAL LOW (ref 22–32)
Calcium: 6.9 mg/dL — ABNORMAL LOW (ref 8.9–10.3)
Chloride: 88 mmol/L — ABNORMAL LOW (ref 98–111)
Creatinine, Ser: 2.57 mg/dL — ABNORMAL HIGH (ref 0.44–1.00)
GFR calc Af Amer: 17 mL/min — ABNORMAL LOW (ref >60)
GFR, EST NON AFRICAN AMERICAN: 15 mL/min — AB (ref >60)
GLUCOSE: 180 mg/dL — AB (ref 70–99)
POTASSIUM: 5.5 mmol/L — AB (ref 3.5–5.1)
Sodium: 140 mmol/L (ref 135–145)

## 2018-01-24 LAB — GLUCOSE, CAPILLARY
GLUCOSE-CAPILLARY: 145 mg/dL — AB (ref 70–99)
GLUCOSE-CAPILLARY: 142 mg/dL — AB (ref 70–99)
Glucose-Capillary: 188 mg/dL — ABNORMAL HIGH (ref 70–99)
Glucose-Capillary: 187 mg/dL — ABNORMAL HIGH (ref 70–99)
Glucose-Capillary: 181 mg/dL — ABNORMAL HIGH (ref 70–99)
Glucose-Capillary: 202 mg/dL — ABNORMAL HIGH (ref 70–99)
Glucose-Capillary: 109 mg/dL — ABNORMAL HIGH (ref 70–99)

## 2018-01-24 LAB — COMPREHENSIVE METABOLIC PANEL
ALK PHOS: 71 U/L (ref 38–126)
ALT: 5520 U/L — ABNORMAL HIGH (ref 0–44)
AST: 9960 U/L — ABNORMAL HIGH (ref 15–41)
Albumin: 2.1 g/dL — ABNORMAL LOW (ref 3.5–5.0)
Anion gap: 26 — ABNORMAL HIGH (ref 5–15)
BILIRUBIN TOTAL: 1.9 mg/dL — AB (ref 0.3–1.2)
BUN: 37 mg/dL — AB (ref 8–23)
CALCIUM: 8.1 mg/dL — AB (ref 8.9–10.3)
CO2: 19 mmol/L — ABNORMAL LOW (ref 22–32)
CREATININE: 2.44 mg/dL — AB (ref 0.44–1.00)
Chloride: 97 mmol/L — ABNORMAL LOW (ref 98–111)
GFR calc Af Amer: 18 mL/min — ABNORMAL LOW (ref >60)
GFR, EST NON AFRICAN AMERICAN: 16 mL/min — AB (ref >60)
Glucose, Bld: 195 mg/dL — ABNORMAL HIGH (ref 70–99)
Potassium: 5.3 mmol/L — ABNORMAL HIGH (ref 3.5–5.1)
Sodium: 142 mmol/L (ref 135–145)
Total Protein: 3.7 g/dL — ABNORMAL LOW (ref 6.5–8.1)

## 2018-01-24 LAB — RENAL FUNCTION PANEL
ANION GAP: 20 — AB (ref 5–15)
Albumin: 2.4 g/dL — ABNORMAL LOW (ref 3.5–5.0)
BUN: 40 mg/dL — ABNORMAL HIGH (ref 8–23)
CALCIUM: 8.7 mg/dL — AB (ref 8.9–10.3)
CHLORIDE: 102 mmol/L (ref 98–111)
CO2: 9 mmol/L — AB (ref 22–32)
Creatinine, Ser: 2.32 mg/dL — ABNORMAL HIGH (ref 0.44–1.00)
GFR calc non Af Amer: 17 mL/min — ABNORMAL LOW (ref >60)
GFR, EST AFRICAN AMERICAN: 19 mL/min — AB (ref >60)
Glucose, Bld: 145 mg/dL — ABNORMAL HIGH (ref 70–99)
Phosphorus: 10.4 mg/dL — ABNORMAL HIGH (ref 2.5–4.6)
Potassium: 6.6 mmol/L (ref 3.5–5.1)
Sodium: 131 mmol/L — ABNORMAL LOW (ref 135–145)

## 2018-01-24 LAB — PREPARE RBC (CROSSMATCH)

## 2018-01-24 LAB — COOXEMETRY PANEL
Carboxyhemoglobin: 0.7 % (ref 0.5–1.5)
METHEMOGLOBIN: 2.1 % — AB (ref 0.0–1.5)
O2 Saturation: 38 %
Total hemoglobin: 7.3 g/dL — ABNORMAL LOW (ref 12.0–16.0)

## 2018-01-24 LAB — PROTIME-INR
INR: 5.9
PROTHROMBIN TIME: 51.9 s — AB (ref 11.4–15.2)

## 2018-01-24 LAB — HEMOGLOBIN AND HEMATOCRIT, BLOOD
HEMATOCRIT: 27.9 % — AB (ref 36.0–46.0)
Hemoglobin: 8.3 g/dL — ABNORMAL LOW (ref 12.0–15.0)

## 2018-01-24 LAB — LACTIC ACID, PLASMA
LACTIC ACID, VENOUS: 12.4 mmol/L — AB (ref 0.5–1.9)
LACTIC ACID, VENOUS: 26.7 mmol/L — AB (ref 0.5–1.9)
Lactic Acid, Venous: 18.6 mmol/L (ref 0.5–1.9)

## 2018-01-24 LAB — DIC (DISSEMINATED INTRAVASCULAR COAGULATION) PANEL
APTT: 65 s — AB (ref 24–36)
PLATELETS: 62 10*3/uL — AB (ref 150–400)

## 2018-01-24 LAB — HEPARIN LEVEL (UNFRACTIONATED): Heparin Unfractionated: 0.29 IU/mL — ABNORMAL LOW (ref 0.30–0.70)

## 2018-01-24 LAB — CORTISOL: CORTISOL PLASMA: 46.3 ug/dL

## 2018-01-24 LAB — TROPONIN I: TROPONIN I: 0.6 ng/mL — AB (ref <0.03)

## 2018-01-24 MED ORDER — SODIUM CHLORIDE 0.9% IV SOLUTION
Freq: Once | INTRAVENOUS | Status: AC
  Administered 2018-01-24: 12:00:00 via INTRAVENOUS

## 2018-01-24 MED ORDER — SODIUM BICARBONATE 8.4 % IV SOLN
100.0000 meq | Freq: Once | INTRAVENOUS | Status: AC
  Administered 2018-01-24: 100 meq via INTRAVENOUS
  Filled 2018-01-24: qty 100

## 2018-01-24 MED ORDER — SODIUM BICARBONATE 8.4 % IV SOLN
100.0000 meq | Freq: Once | INTRAVENOUS | Status: AC
  Administered 2018-01-24: 100 meq via INTRAVENOUS
  Filled 2018-01-24: qty 50

## 2018-01-24 MED ORDER — INSULIN ASPART 100 UNIT/ML IV SOLN
5.0000 [IU] | Freq: Once | INTRAVENOUS | Status: AC
  Administered 2018-01-24: 5 [IU] via INTRAVENOUS

## 2018-01-24 MED ORDER — SODIUM BICARBONATE 8.4 % IV SOLN
INTRAVENOUS | Status: AC
  Filled 2018-01-24: qty 50

## 2018-01-24 MED ORDER — SODIUM CHLORIDE 0.9 % IV BOLUS
250.0000 mL | Freq: Once | INTRAVENOUS | Status: AC
  Administered 2018-01-24: 250 mL via INTRAVENOUS

## 2018-01-24 MED ORDER — NOREPINEPHRINE 16 MG/250ML-% IV SOLN
0.0000 ug/min | INTRAVENOUS | Status: DC
  Administered 2018-01-24 – 2018-01-25 (×2): 40 ug/min via INTRAVENOUS
  Filled 2018-01-24: qty 250

## 2018-01-24 MED ORDER — SODIUM CHLORIDE 0.9% IV SOLUTION
Freq: Once | INTRAVENOUS | Status: AC
  Administered 2018-01-24: 23:00:00 via INTRAVENOUS

## 2018-01-24 MED ORDER — ACETAMINOPHEN 160 MG/5ML PO SOLN
650.0000 mg | Freq: Four times a day (QID) | ORAL | Status: DC
  Administered 2018-01-24: 650 mg
  Filled 2018-01-24: qty 20.3

## 2018-01-24 MED ORDER — DEXTROSE 50 % IV SOLN
1.0000 | Freq: Once | INTRAVENOUS | Status: AC
  Administered 2018-01-24: 50 mL via INTRAVENOUS
  Filled 2018-01-24: qty 50

## 2018-01-24 MED ORDER — STERILE WATER FOR INJECTION IV SOLN
INTRAVENOUS | Status: DC
  Administered 2018-01-24 – 2018-01-25 (×3): via INTRAVENOUS
  Filled 2018-01-24 (×4): qty 850

## 2018-01-24 MED ORDER — VASOPRESSIN 20 UNIT/ML IV SOLN
0.0300 [IU]/min | INTRAVENOUS | Status: DC
  Administered 2018-01-24 – 2018-01-25 (×2): 0.03 [IU]/min via INTRAVENOUS
  Filled 2018-01-24 (×2): qty 2

## 2018-01-24 MED ORDER — AMIODARONE HCL IN DEXTROSE 360-4.14 MG/200ML-% IV SOLN
60.0000 mg/h | INTRAVENOUS | Status: DC
  Administered 2018-01-24 (×2): 60 mg/h via INTRAVENOUS
  Filled 2018-01-24 (×3): qty 200

## 2018-01-24 MED ORDER — SODIUM BICARBONATE 8.4 % IV SOLN
200.0000 meq | Freq: Once | INTRAVENOUS | Status: AC
  Administered 2018-01-24: 200 meq via INTRAVENOUS
  Filled 2018-01-24: qty 50

## 2018-01-24 MED ORDER — AMIODARONE HCL IN DEXTROSE 360-4.14 MG/200ML-% IV SOLN
30.0000 mg/h | INTRAVENOUS | Status: DC
  Administered 2018-01-24: 30 mg/h via INTRAVENOUS
  Filled 2018-01-24: qty 200

## 2018-01-24 MED ORDER — SODIUM CHLORIDE 0.9 % IV SOLN
1.0000 g | Freq: Once | INTRAVENOUS | Status: AC
  Administered 2018-01-24: 1 g via INTRAVENOUS
  Filled 2018-01-24: qty 10

## 2018-01-24 MED ORDER — PHENYLEPHRINE HCL-NACL 40-0.9 MG/250ML-% IV SOLN
0.0000 ug/min | INTRAVENOUS | Status: DC
  Administered 2018-01-24: 380 ug/min via INTRAVENOUS
  Filled 2018-01-24 (×2): qty 250

## 2018-01-24 MED ORDER — PHENYLEPHRINE HCL-NACL 10-0.9 MG/250ML-% IV SOLN
0.0000 ug/min | INTRAVENOUS | Status: DC
  Administered 2018-01-24: 20 ug/min via INTRAVENOUS
  Filled 2018-01-24 (×2): qty 250

## 2018-01-24 MED ORDER — FENTANYL CITRATE (PF) 100 MCG/2ML IJ SOLN
12.5000 ug | INTRAMUSCULAR | Status: DC | PRN
  Administered 2018-01-24 (×3): 12.5 ug via INTRAVENOUS
  Filled 2018-01-24 (×2): qty 2

## 2018-01-24 MED ORDER — INSULIN ASPART 100 UNIT/ML ~~LOC~~ SOLN
1.0000 [IU] | SUBCUTANEOUS | Status: DC
  Administered 2018-01-24: 3 [IU] via SUBCUTANEOUS

## 2018-01-24 MED ORDER — AMIODARONE LOAD VIA INFUSION
150.0000 mg | Freq: Once | INTRAVENOUS | Status: AC
  Administered 2018-01-24: 150 mg via INTRAVENOUS
  Filled 2018-01-24: qty 83.34

## 2018-01-24 MED ORDER — SODIUM POLYSTYRENE SULFONATE 15 GM/60ML PO SUSP
60.0000 g | Freq: Once | ORAL | Status: AC
  Administered 2018-01-24: 60 g via ORAL
  Filled 2018-01-24: qty 240

## 2018-01-24 MED ORDER — VITAL HIGH PROTEIN PO LIQD
1000.0000 mL | ORAL | Status: DC
  Administered 2018-01-24: 1000 mL

## 2018-01-24 MED ORDER — ALBUMIN HUMAN 5 % IV SOLN
12.5000 g | Freq: Once | INTRAVENOUS | Status: AC
  Administered 2018-01-24: 12.5 g via INTRAVENOUS
  Filled 2018-01-24: qty 250

## 2018-01-24 NOTE — Progress Notes (Signed)
This RN called Niece Wille Celeste and updated her about pt's condition and changes per RN Alma request. Niece Wille Celeste stated she is going to contact her sister and come to the hospital.  Genelle Bal, RN

## 2018-01-24 NOTE — Progress Notes (Signed)
ANTICOAGULATION CONSULT NOTE   Pharmacy Consult for warfarin >> heparin Indication: atrial fibrillation  Allergies  Allergen Reactions  . Penicillins Hives and Swelling    Has patient had a PCN reaction causing immediate rash, facial/tongue/throat swelling, SOB or lightheadedness with hypotension: Yes Has patient had a PCN reaction causing severe rash involving mucus membranes or skin necrosis: Yes Has patient had a PCN reaction that required hospitalization No Has patient had a PCN reaction occurring within the last 10 years: No If all of the above answers are "NO", then may proceed with Cephalosporin use.    . Sulfa Antibiotics Anaphylaxis and Swelling  . Fish Allergy Nausea And Vomiting    Patient Measurements: Height: 5' 5.98" (167.6 cm) Weight: 126 lb 1.7 oz (57.2 kg) IBW/kg (Calculated) : 59.26  Vital Signs: Temp: 97.7 F (36.5 C) (11/02 0300) Temp Source: Axillary (11/02 0300) BP: 92/49 (11/01 2330) Pulse Rate: 110 (11/01 2330)  Labs: Recent Labs    12/28/2017 1702 01/26/2018 0011 02/14/2018 0256 02/21/2018 0623 02/08/2018 1308 02/05/2018 1530 01/24/18 0255  HGB 12.1  --  10.9*  --  10.9* 10.6*  --   HCT 41.5  --  35.8*  --  32.0* 36.0  --   PLT 190  --  146*  --   --  151  --   LABPROT 18.0*  --   --  18.7*  --   --   --   INR 1.51  --   --  1.58  --   --   --   HEPARINUNFRC  --   --   --   --   --   --  0.29*  CREATININE 1.51*  --  1.50*  --   --  1.81*  --   TROPONINI  --  <0.03  --   --   --  0.08*  --     Estimated Creatinine Clearance: 16.4 mL/min (A) (by C-G formula based on SCr of 1.81 mg/dL (H)).   Medical History: Past Medical History:  Diagnosis Date  . Chronic a-fib   . Chronic back pain   . Chronic systolic (congestive) heart failure (HCC)    reduced EF since 2018 to 35-40%  . CKD (chronic kidney disease) stage 4, GFR 15-29 ml/min (HCC) 09/11/2015  . Hyperlipidemia   . Hypertension   . Hypothyroidism   . Pulmonary hypertension (HCC)    PASP 65  mmHg at Echo 3/17  . Stroke (HCC)   . Thyroid disease      Assessment: Tonya Farley on warfarin PTA for hx of AFib admitted with mechanical fall. Warfarin has been held on admit and pt is now s/p R hip hemiarthroplasty and wound vac application. Pharmacy consulted to resume warfarin tonight with plans for enoxaparin bridge tomorrow. INR this morning 1.58.   Pt decompensated post/op requiring intubation and brief CPR. Pharmacy to transition to IV heparin for now. Given recent surgery will target low goal and avoid bolus. Head CT negative for acute process.  Initial heparin level 0.29 units/ml  Goal of Therapy:  Heparin level 0.3-0.5 units/ml Monitor platelets by anticoagulation protocol: Yes   Plan: Continue IV heparin 700 units/hr -Check heparin level later today  Thanks for allowing pharmacy to be a part of this patient's care.  Talbert Cage, PharmD Clinical Pharmacist

## 2018-01-24 NOTE — Progress Notes (Signed)
Nutrition Follow-up  DOCUMENTATION CODES:   Not applicable  INTERVENTION:  Continue trickle tube feeding via OGT using Vital High Protein at rate of 20 ml/hr.  Once able to advance past trickle feeds,  Recommend switching formula to Vital AF 1.2 and advance by 10 ml every 4 hours or as tolerated to goal rate of 45 ml/hr (1080 ml).  Tube feeding at goal rate will provide 1296 kcal, 81 grams of protein, and 875 ml water.   NUTRITION DIAGNOSIS:   Inadequate oral intake related to inability to eat as evidenced by NPO status; ongoing  GOAL:   Patient will meet greater than or equal to 90% of their needs; not met  MONITOR:   Vent status, Labs, Weight trends, Skin, I & O's  REASON FOR ASSESSMENT:   Ventilator, Consult Hip fracture protocol  ASSESSMENT:   Tonya Farley is a 84yo F admitted for right hip fracture following [facility] fall at [facility]. She was in AFib with RVR in the ED, improved with bolus of cardizem, and went to the OR for arthroplasty 11/1. After discussion with the HCPOA by anesthesia, her DNR was lifted perioperatively. She did require neo intraoperatively and brief resuscitation (~30 seconds) post operatively prior to Fenton and transfer to ICU.  Patient is currently intubated on ventilator support MV: 11.3 L/min Temp (24hrs), Avg:96.4 F (35.8 C), Min:94 F (34.4 C), Max:98.2 F (36.8 C)  Propofol: none   Per MD, pt with ongoing increase in pressors overnight.Trickle tube feedings have been initiated via MD. Per RN, pt has been tolerating the tube feedings thus far. Once able to advance past trickle feeds, recommend switching formula to Vital AF 1.2 with goal rate of 45 ml/hr. RD to continue to monitor.   Labs and medications reviewed. Potassium elevated at 5.3. AST elevated at 9,960. ALT elevated at 5,520.  Diet Order:   Diet Order            Diet NPO time specified  Diet effective now              EDUCATION NEEDS:   Not appropriate for education at  this time  Skin:  Skin Assessment: Skin Integrity Issues: Skin Integrity Issues:: Wound VAC, Incisions Wound Vac: R hip Incisions: R hip  Last BM:  PTA  Height:   Ht Readings from Last 1 Encounters:  02/01/2018 5' 5.98" (1.676 m)    Weight:   Wt Readings from Last 1 Encounters:  02/06/2018 57.2 kg    Ideal Body Weight:  59.1 kg  BMI:  Body mass index is 20.36 kg/m.  Estimated Nutritional Needs:   Kcal:  1230  Protein:  70-85 grams  Fluid:  > 1.2 L/day    Corrin Parker, MS, RD, LDN Pager # [phone] After hours/ weekend pager # [phone]

## 2018-01-24 NOTE — Progress Notes (Signed)
CRITICAL VALUE ALERT  Critical Value:  Troponin 0.60       INR 5.9  Date & Time Notied:  01/24/2018 0500  Provider Notified: Pola Corn

## 2018-01-24 NOTE — Progress Notes (Signed)
eLink Physician-Brief Progress Note Patient Name: Tonya Farley DOB: 1919-06-06 MRN: [id number]   Date of Service  01/24/2018  HPI/Events of Note  Patient with ongoing hypotension on pressor support.  Maxed out on NE.  Is a full DNR with worsening renal function.  Family not at bedside.  Hyperkalemia and worsening acid/base  eICU Interventions  1. Fluid bolus 2. Place on NEO gtt for additional BP support with hopes to remove from NE as this might aggravate the tachycardia. 3. Treat the hyperkalemia with calcium/dextrose/insulin/bicarb 4. Nurse to contact family to inform as to continued deterioration of patient     Intervention Category Major Interventions: Acid-Base disturbance - evaluation and management;Electrolyte abnormality - evaluation and management;Hypotension - evaluation and management;Arrhythmia - evaluation and management  , 01/24/2018, 4:53 AM

## 2018-01-24 NOTE — Progress Notes (Signed)
Pt niece at bedside, who is pt's POA accusing RN of not wanting to call doctor d/t patient blood pressure. Niece educated by RN and RN tried to reassure her that pt's vitals were within range (MD orders) but niece still being rude saying "just keep walking around not doing anything and I'll just keep documenting this" taking notes about her concerns and what RN is "not wanting" to address, as RN is at bedside assessing patient, RN reassured pt niece once again and simply told her that documenting stuff on her end was fine. RN understands pt's parameters but still calls [facility] to let them know of the situation. [facility] RN camer'd in and spoke with niece and reassured her that RN was following MD orders and that her aunt was being taken care of appropriately.   Will continue to monitor patient.

## 2018-01-24 NOTE — Progress Notes (Signed)
CRITICAL VALUE ALERT  Critical Value:  ABG pH 7.194 CO2 31.3 PO2 399 Bicarb 11.7 Base Excess 14.9  Date & Time Notied:  01/24/18  Provider Notified: Pola Corn MD  Orders Received/Actions taken: see orders

## 2018-01-24 NOTE — Progress Notes (Addendum)
[facility] MD has ordered the following for patient after critical lab results called:  Kayexalate 5 units of insulin IV followed by 1 amp ofD50 8 amps of sodium bicarb  Calcium gluconate Phenylephrine gtt Fluid Bolus Sodium bicarb gtt STAT ABG STAT EKG

## 2018-01-24 NOTE — Progress Notes (Signed)
NAME:  Tonya Farley, MRN:  [id number], DOB:  03-02-1920, LOS: 2 ADMISSION DATE:  12/30/2017, CONSULTATION DATE:02/01/2018 REFERRING MD:  2018-02-01, CHIEF COMPLAINT:  Shock post cardiac arrest.   Brief History   84 year old female, fall at skilled nursing facility sustaining a right hip fracture. She was taken to the operating room, rescinded DNR, developed hypotension post surgery requiring Neo-Synephrine, subsequently had brief cardiac arrest less than 30 seconds, 1 round of epinephrine and chest compressions patient had return of spontaneous circulation and transferred to the intensive care unit. No known demential but stage IV CKD, HFREF with pulmonary hypertension, afib.   Consults: date of consult/date signed off & final recs:  01-Feb-2018: Critical care consultation, ICU admission  Procedures (surgical and bedside):  Feb 01, 2018: Right hemi-arthroplasty, hip  02-01-2018: Right internal jugular venous line placement by anesthesia  02/01/2018: Right radial arterial line by anesthesia  Subjective:  Appears uncomfortable. Ongoing increase in pressors overnight.  Objective    Examination: General: Frail elderly woman. Appears uncomfortable. HENT: ETT and OGT in place with no pressure ulceration Lungs: Clear bilaterally, on PRVC with no asynchrony, overbreathing ventilator, Pplat 17. On 0.5/5 Cardiovascular: Extremities cool and capillary refill poor. Tachycardic in atrial fibrillation. HS are normal with no edema. No JVD Abdomen: soft, no bowel sounds Extremities: right hip incision with wound vac. Tender to palpation. Neuro: Awake but not following commands, moves all limbs purposefully. GU: Minimal urine  I performed a bedside echo which showed impaired RV and LV systolic function with MR +, TR ++ and Septal flattening. IVC was 1.5cm with 25% respiratory variation  Resolved Hospital Problem list    Assessment & Plan:   Critically ill due to cardiogenic shock due to  decompensated right heart failure requiring titration of vasopressors. Critically ill due to hypoxic acute respiratory failure requiring mechanical ventilation May still be volume responsive based on IVC diameter. Atrial fibrillation with RVR - ventricular filling may be impaired. Acute worsening of CKD with oliguria, acidosis and hyperkalemia Status post hip fracture surgery with associated pain.   PLAN  Switched phenylephrine to vasopressin as more favorable profile on PVR. Continued norepinephrine and titrating to MAP>65, follow clinical perfusion Albumin fluid challenge.  Clearsight to help guide fluid management. Amiodarone infusion to control HR, target 100-115. Continue full ventilatory support to off load heart. Follow urine output - may consider trial of diuretics once MAP stabilized.  Continue bicarbonate infusion and repeat chemistry. Low dose fentanyl for comfort.   Disposition / Summary of Today's Plan 01/24/18   Will need prompt correction of shock if patient is to have chance of recovery    Nutritional status and diet: severe malnutrition - initiate trickle fees Pain/Anxiety/Delirium reduction: low dose fentanyl VAP protocol (if indicated) In place. DVT prophylaxis: [place] heparin GI prophylaxis: famotidine. Hyperglycemia protocol: well controlled on correction scale only Mobility: bedrest Antibiotic de-escalation: perioperative antibiotics. Code Status: DNR Family Communication: discussed condition with nieces - understand that will transition to comfort care if we are not able to reverse shock promptly.  Labs and Ancillary Testing (personally reviewed)  CBC: marked leukocytosis, mild anemia, mild thrombocytopenia  Chemistry: increasing creatinine with hyponatremia and marked metabolic acidosis and hyperkalemia.  Lactate 12 this morning  ABG: partially compensated metabolic acidosis.  Coagulation Profile: INR 5.90  Cardiac Enzymes: TNI 0.6  CBG:  140's  Microbiology: NGTD   CRITICAL CARE Performed by: Lynnell Catalan   Total critical care time: 60 minutes  Critical care time was exclusive of separately billable procedures and  treating other patients.  Critical care was necessary to treat or prevent imminent or life-threatening deterioration.  Critical care was time spent personally by me on the following activities: development of treatment plan with patient and/or surrogate as well as nursing, discussions with consultants, evaluation of patient's response to treatment, examination of patient, obtaining history from patient or surrogate, ordering and performing treatments and interventions, ordering and review of laboratory studies, ordering and review of radiographic studies, pulse oximetry, re-evaluation of patient's condition, and participation in multidisciplinary rounds.   Lynnell Catalan, MD [facility] ICU Physician [facility]  Pager: [phone] Mobile: [phone] After hours: [phone].

## 2018-01-24 NOTE — Progress Notes (Signed)
OT Cancellation Note  Patient Details Name: Tonya Farley MRN: [id number] DOB: 1919-08-18   Cancelled Treatment:    Reason Eval/Treat Not Completed: Medical issues which prohibited therapy.  Pt intubated, sedated, and multiple medical issues.  Will check back if/when pt becomes appropriate for therapies.  Jeani Hawking, OTR/L Acute Rehabilitation Services Pager [phone] Office [phone]   Jeani Hawking M 01/24/2018, 8:58 AM

## 2018-01-24 NOTE — Progress Notes (Signed)
CRITICAL VALUE ALERT  Critical Value:  Lactic 12.4  Date & Time Notied:  01/24/2018 0538  Provider Notified:ELINK

## 2018-01-24 NOTE — Progress Notes (Signed)
Niece Wille Celeste called this RN back and requested this RN call Niece Darel Hong and give her an update as well since this niece is in the medical field.  Niece Darel Hong called and this RN attempted to update. Niece Darel Hong very rude and disrespectful to this RN. Niece Darel Hong requesting that a cardiologist is consulted and should come see this patient ASAP. Niece Darel Hong also stated that staff is not treating patient "adequetly". This RN not able to fully update Niece Darel Hong due to her interrupting and insulting staff. Niece Darel Hong insisting that patient is not doing well because staff did not adequately treat her aunt's blood pressure. Niece Darel Hong stated multiple times that she is going to report staff and have chart reviewed, this RN assured Niece that this is perfectly fine and this RN gave Niece her name.     ELINK notified. MD stated "going to request cardiology to come see pt this AM"  Genelle Bal, RN

## 2018-01-24 NOTE — Anesthesia Postprocedure Evaluation (Signed)
Anesthesia Post Note  Patient: Tonya Farley  Procedure(s) Performed: ANTERIOR APPROACH HEMI HIP ARTHROPLASTY (Right Hip)     Patient location during evaluation: ICU Anesthesia Type: General Level of consciousness: patient remains intubated per anesthesia plan (intubated but awake, does not consistently follow commands) Pain management: pain level controlled Vital Signs Assessment: post-procedure vital signs reviewed and stable Respiratory status: patient remains intubated per anesthesia plan Cardiovascular status: on pressor support. Postop Assessment: no apparent nausea or vomiting Anesthetic complications: yes Anesthetic complication details: anesthesia complicationsComments: Brief cardiac arrest in the OR with return of spontaneous circulation. Pt remained intubated and transferred to ICU. Please see anesthesia record for details.    Last Vitals:  Vitals:   01/24/18 0700 01/24/18 0745  BP: 92/65 (!) 154/56  Pulse:  (!) 124  Resp: (!) 30 (!) 29  Temp:    SpO2:  100%    Last Pain:  Vitals:   01/24/18 0300  TempSrc: Axillary  PainSc:                  Albertia Carvin L Nani Ingram

## 2018-01-24 NOTE — Progress Notes (Signed)
PT Cancellation Note  Patient Details Name: Tonya Farley MRN: [id number] DOB: 1919/05/29   Cancelled Treatment:    Reason Eval/Treat Not Completed: Patient not medically ready PT orders acknowledged, pt remains intubated/sedated with critical lab values this morning. Will cont to follow.  Etta Grandchild, PT, DPT Acute Rehabilitation Services Pager: [phone] Office: [phone]     Etta Grandchild 01/24/2018, 8:44 AM

## 2018-01-24 NOTE — Progress Notes (Signed)
   Subjective: 1 Day Post-Op Procedure(s) (LRB): ANTERIOR APPROACH HEMI HIP ARTHROPLASTY (Right)  Currently pt intubated and sedated Multiple critical lab values this morning after arrest yesterday   Objective:   VITALS:   Vitals:   01/24/18 0640 01/24/18 0700  BP: (!) 84/39 92/65  Pulse: (!) 118   Resp: (!) 30 (!) 30  Temp:    SpO2: 100%    Right hip wound vac in place Pulses weak but present distally Not taken thru any rom  LABS Recent Labs    02/08/2018 0256 02/05/2018 1308 02/20/2018 1530 01/24/18 0255  HGB 10.9* 10.9* 10.6* 9.2*  HCT 35.8* 32.0* 36.0 31.5*  WBC 10.2  --  19.2* 31.0*  PLT 146*  --  151 113*    Recent Labs    01/26/2018 0256 01/29/2018 1308 02/16/2018 1530 01/24/18 0255  NA 137 138 139 131*  K 3.9 5.8* 5.8* 6.6*  BUN 26*  --  31* 40*  CREATININE 1.50*  --  1.81* 2.32*  GLUCOSE 106*  --  136* 145*     Assessment/Plan: 1 Day Post-Op Procedure(s) (LRB): ANTERIOR APPROACH HEMI HIP ARTHROPLASTY (Right) Will continue to monitor her progress    Alphonsa Overall PA-C, MPAS [facility] is now [facility] [address], [address], [place], [place] [address] Phone: [phone] [email or web address] Facebook  [employer]

## 2018-01-24 NOTE — Progress Notes (Signed)
CRITICAL VALUE ALERT  Critical Value:  K+ 6.6  Date & Time Notied:  01/24/18 450  Provider Notified: Pola Corn  Orders Received/Actions taken: see orders

## 2018-01-25 ENCOUNTER — Inpatient Hospital Stay (HOSPITAL_COMMUNITY): Payer: Medicare Other

## 2018-01-25 LAB — POCT I-STAT, CHEM 8
BUN: 42 mg/dL — ABNORMAL HIGH (ref 8–23)
CREATININE: 2.8 mg/dL — AB (ref 0.44–1.00)
Calcium, Ion: 0.67 mmol/L — CL (ref 1.15–1.40)
Chloride: 87 mmol/L — ABNORMAL LOW (ref 98–111)
GLUCOSE: 120 mg/dL — AB (ref 70–99)
HCT: 23 % — ABNORMAL LOW (ref 36.0–46.0)
Hemoglobin: 7.8 g/dL — ABNORMAL LOW (ref 12.0–15.0)
POTASSIUM: 6.1 mmol/L — AB (ref 3.5–5.1)
Sodium: 130 mmol/L — ABNORMAL LOW (ref 135–145)
TCO2: 13 mmol/L — AB (ref 22–32)

## 2018-01-25 LAB — CBC WITH DIFFERENTIAL/PLATELET
Abs Immature Granulocytes: 1.59 10*3/uL — ABNORMAL HIGH (ref 0.00–0.07)
BASOS PCT: 0 %
Basophils Absolute: 0.1 10*3/uL (ref 0.0–0.1)
Eosinophils Absolute: 0 10*3/uL (ref 0.0–0.5)
Eosinophils Relative: 0 %
HCT: 27 % — ABNORMAL LOW (ref 36.0–46.0)
Hemoglobin: 7.9 g/dL — ABNORMAL LOW (ref 12.0–15.0)
Immature Granulocytes: 7 %
LYMPHS ABS: 1.7 10*3/uL (ref 0.7–4.0)
LYMPHS PCT: 7 %
MCH: 28.3 pg (ref 26.0–34.0)
MCHC: 29.3 g/dL — ABNORMAL LOW (ref 30.0–36.0)
MCV: 96.8 fL (ref 80.0–100.0)
MONOS PCT: 4 %
Monocytes Absolute: 1.1 10*3/uL — ABNORMAL HIGH (ref 0.1–1.0)
NEUTROS PCT: 82 %
Neutro Abs: 20 10*3/uL — ABNORMAL HIGH (ref 1.7–7.7)
PLATELETS: 47 10*3/uL — AB (ref 150–400)
RBC: 2.79 MIL/uL — ABNORMAL LOW (ref 3.87–5.11)
RDW: 15.5 % (ref 11.5–15.5)
WBC: 24.4 10*3/uL — ABNORMAL HIGH (ref 4.0–10.5)
nRBC: 1.9 % — ABNORMAL HIGH (ref 0.0–0.2)

## 2018-01-25 LAB — GLUCOSE, CAPILLARY
GLUCOSE-CAPILLARY: 69 mg/dL — AB (ref 70–99)
Glucose-Capillary: 114 mg/dL — ABNORMAL HIGH (ref 70–99)
Glucose-Capillary: 124 mg/dL — ABNORMAL HIGH (ref 70–99)

## 2018-01-25 LAB — BASIC METABOLIC PANEL
Anion gap: 42 — ABNORMAL HIGH (ref 5–15)
BUN: 34 mg/dL — ABNORMAL HIGH (ref 8–23)
CALCIUM: 6.6 mg/dL — AB (ref 8.9–10.3)
CO2: 12 mmol/L — AB (ref 22–32)
CREATININE: 2.94 mg/dL — AB (ref 0.44–1.00)
Chloride: 84 mmol/L — ABNORMAL LOW (ref 98–111)
GFR, EST AFRICAN AMERICAN: 14 mL/min — AB (ref >60)
GFR, EST NON AFRICAN AMERICAN: 12 mL/min — AB (ref >60)
Glucose, Bld: 127 mg/dL — ABNORMAL HIGH (ref 70–99)
Potassium: 6.7 mmol/L (ref 3.5–5.1)
SODIUM: 138 mmol/L (ref 135–145)

## 2018-01-25 LAB — HEPATIC FUNCTION PANEL
ALK PHOS: 156 U/L — AB (ref 38–126)
ALT: 7395 U/L — ABNORMAL HIGH (ref 0–44)
AST: >10000 U/L — ABNORMAL HIGH (ref 15–41)
Albumin: 1.8 g/dL — ABNORMAL LOW (ref 3.5–5.0)
BILIRUBIN INDIRECT: 1 mg/dL — AB (ref 0.3–0.9)
Bilirubin, Direct: 1.2 mg/dL — ABNORMAL HIGH (ref 0.0–0.2)
TOTAL PROTEIN: 3.1 g/dL — AB (ref 6.5–8.1)
Total Bilirubin: 2.2 mg/dL — ABNORMAL HIGH (ref 0.3–1.2)

## 2018-01-25 LAB — LACTIC ACID, PLASMA: LACTIC ACID, VENOUS: 30 mmol/L — AB (ref 0.5–1.9)

## 2018-01-25 LAB — PROTIME-INR
INR: 8.39 — AB
Prothrombin Time: 68.1 seconds — ABNORMAL HIGH (ref 11.4–15.2)

## 2018-01-25 LAB — TROPONIN I: Troponin I: 35.94 ng/mL (ref <0.03)

## 2018-01-25 MED ORDER — VITAMIN K1 10 MG/ML IJ SOLN
10.0000 mg | Freq: Once | INTRAVENOUS | Status: AC
  Administered 2018-01-25: 10 mg via INTRAVENOUS
  Filled 2018-01-25: qty 1

## 2018-01-25 MED ORDER — SODIUM BICARBONATE 8.4 % IV SOLN
INTRAVENOUS | Status: DC
  Administered 2018-01-25: 01:00:00 via INTRAVENOUS
  Filled 2018-01-25 (×3): qty 150

## 2018-01-25 MED ORDER — DEXTROSE 50 % IV SOLN
INTRAVENOUS | Status: AC
  Filled 2018-01-25: qty 50

## 2018-01-25 MED ORDER — ALBUMIN HUMAN 5 % IV SOLN
25.0000 g | Freq: Once | INTRAVENOUS | Status: AC
  Administered 2018-01-25: 25 g via INTRAVENOUS
  Filled 2018-01-25: qty 500

## 2018-01-25 MED ORDER — CALCIUM GLUCONATE-NACL 1-0.675 GM/50ML-% IV SOLN
1.0000 g | Freq: Once | INTRAVENOUS | Status: AC
  Administered 2018-01-25: 1000 mg via INTRAVENOUS
  Filled 2018-01-25: qty 50

## 2018-01-25 MED ORDER — ALBUTEROL SULFATE (2.5 MG/3ML) 0.083% IN NEBU
10.0000 mg | INHALATION_SOLUTION | Freq: Once | RESPIRATORY_TRACT | Status: AC
  Administered 2018-01-25: 10 mg via RESPIRATORY_TRACT
  Filled 2018-01-25: qty 12

## 2018-01-25 MED ORDER — DEXTROSE 50 % IV SOLN
25.0000 mL | Freq: Once | INTRAVENOUS | Status: AC
  Administered 2018-01-25: 25 mL via INTRAVENOUS

## 2018-01-25 MED ORDER — CALCIUM GLUCONATE-NACL 1-0.675 GM/50ML-% IV SOLN
1.0000 g | Freq: Once | INTRAVENOUS | Status: DC
  Filled 2018-01-25: qty 50

## 2018-01-25 MED ORDER — SODIUM BICARBONATE 8.4 % IV SOLN
100.0000 meq | Freq: Once | INTRAVENOUS | Status: AC
  Administered 2018-01-25: 100 meq via INTRAVENOUS

## 2018-01-25 MED ORDER — SODIUM BICARBONATE 8.4 % IV SOLN
INTRAVENOUS | Status: AC
  Filled 2018-01-25: qty 100

## 2018-01-26 ENCOUNTER — Encounter (HOSPITAL_COMMUNITY): Payer: Self-pay | Admitting: Orthopedic Surgery

## 2018-01-26 LAB — BPAM RBC
BLOOD PRODUCT EXPIRATION DATE: 201911282359
Blood Product Expiration Date: 201911292359
Blood Product Expiration Date: 201912072359
ISSUE DATE / TIME: 201911022325
ISSUE DATE / TIME: 201911021052
UNIT TYPE AND RH: 6200
UNIT TYPE AND RH: 8400
UNIT TYPE AND RH: 8400

## 2018-01-26 LAB — TYPE AND SCREEN
ABO/RH(D): AB POS
Antibody Screen: NEGATIVE
UNIT DIVISION: 0
UNIT DIVISION: 0
Unit division: 0

## 2018-01-26 LAB — BPAM FFP
Blood Product Expiration Date: 201911072359
ISSUE DATE / TIME: 201911030116
Unit Type and Rh: 8400

## 2018-01-26 LAB — PREPARE FRESH FROZEN PLASMA

## 2018-01-26 LAB — POCT I-STAT 3, ART BLOOD GAS (G3+)
Acid-base deficit: 17 mmol/L — ABNORMAL HIGH (ref 0.0–2.0)
BICARBONATE: 10.2 mmol/L — AB (ref 20.0–28.0)
O2 Saturation: 96 %
Patient temperature: 99.1
TCO2: 11 mmol/L — ABNORMAL LOW (ref 22–32)
pCO2 arterial: 28.2 mmHg — ABNORMAL LOW (ref 32.0–48.0)
pH, Arterial: 7.17 — CL (ref 7.350–7.450)
pO2, Arterial: 102 mmHg (ref 83.0–108.0)

## 2018-01-26 LAB — HEPARIN INDUCED PLATELET AB (HIT ANTIBODY): HEPARIN INDUCED PLT AB: 0.117 {OD_unit} (ref 0.000–0.400)

## 2018-01-26 LAB — GLUCOSE, CAPILLARY: Glucose-Capillary: <10 mg/dL — CL (ref 70–99)

## 2018-01-30 ENCOUNTER — Other Ambulatory Visit: Payer: Self-pay | Admitting: Interventional Cardiology

## 2018-02-16 ENCOUNTER — Ambulatory Visit: Payer: Medicare Other | Admitting: Adult Health

## 2018-02-22 NOTE — Progress Notes (Signed)
Patient ID: Tonya Farley, female   DOB: [birth year], 84 y.o.   MRN: [id number]  Unfortunately when I went to check on her this am, nursing and treating MD informed me that she had passed away earlier this am despite significant efforts at preserving cardiac function.  Passed along information to Dr. Linna Caprice

## 2018-02-22 NOTE — Progress Notes (Signed)
PCCM Interval Note  Called to patient room for progressive Hyperkalemia. Given Albuterol/Bicarb/Calcium. Family updated on current condition. Patient then noted to be agonal and PEA. At [phone] patient pronounced. Family notified, will call back to decide if they have plans to come back to hospital.   Jovita Kussmaul, AGACNP-BC [facility]  PCCM Pgr: [phone]

## 2018-02-22 NOTE — Death Summary Note (Signed)
DEATH SUMMARY   Patient Details  Name: NYJA WESTBROOK MRN: [id number] DOB: 1919-06-24  Admission/Discharge Information   Admit Date:  02-07-2018  Date of Death: Date of Death: 02/10/18  Time of Death: Time of Death: 0627  Length of Stay: 3  Referring Physician: Burton Apley, MD   Reason(s) for Hospitalization  right hip fracture  Diagnoses  Preliminary cause of death:    Cardiogenic shock due to pulmonary hypertension.  Secondary Diagnoses (including complications and co-morbidities):  Principal Problem:   Closed right hip fracture, initial encounter (HCC) Active Problems:   Hypertension   Chronic systolic CHF (congestive heart failure) (HCC), EF 35%   CKD (chronic kidney disease) stage 3, GFR 30-59 ml/min (HCC)   Pulmonary hypertension, moderate to severe (HCC)   Atrial fibrillation with RVR (HCC)   Hypothyroidism, adult   Hip fracture (HCC)   Closed displaced fracture of right femoral neck [facility])   Brief Hospital Course (including significant findings, care, treatment, and services provided and events leading to death)  Ayeza JAKEIRA SEEMAN is a 84 y.o. year old female who presented with a right hip fracture after a fall at home. She was brought to the OR and underwent successful ORIF of right hip, but developed profound hypotension and shock post-operatively likely secondary to decompensated RV failure from known severe pulmonary hypertension.  She remained in shock despite multiple adjustments in pressors.  She eventually succumbed to hyperkalemia from renal failure and didn't receive CPR for PEA arrest as per prior discussions with family.     Pertinent Labs and Studies  Significant Diagnostic Studies Dg Chest 1 View  Result Date: Feb 07, 2018 CLINICAL DATA:  Recent fall today with right hip pain, initial encounter EXAM: CHEST  1 VIEW COMPARISON:  11/12/2017 FINDINGS: Cardiac shadow is enlarged. Aortic calcifications are again seen. Some chronic appearing changes  in the left base are noted with mild scarring and blunting of left costophrenic angle. No acute focal infiltrate is noted. Mild elevation of the left hemidiaphragm is noted. IMPRESSION: Chronic changes in the left base.  No acute abnormality is noted. Electronically Signed   By: Alcide Clever M.D.   On: 07-Feb-2018 16:03   Ct Head Wo Contrast  Result Date: 02/06/2018 CLINICAL DATA:  Altered level of consciousness. Status post RIGHT hip fracture. EXAM: CT HEAD WITHOUT CONTRAST TECHNIQUE: Contiguous axial images were obtained from the base of the skull through the vertex without intravenous contrast. COMPARISON:  Head CT dated 07/10/2017. FINDINGS: Brain: Mild chronic small vessel ischemic change within the deep periventricular white matter regions bilaterally. No mass, hemorrhage, edema or other evidence of acute parenchymal abnormality. No extra-axial hemorrhage. Vascular: Chronic calcified atherosclerotic changes of the large vessels at the skull base. No unexpected hyperdense vessel. Skull: Normal. Negative for fracture or focal lesion. Sinuses/Orbits: No acute finding. Other: None. IMPRESSION: No acute findings.  No intracranial mass, hemorrhage or edema. Electronically Signed   By: Bary Richard M.D.   On: 02/20/2018 16:18   Dg Chest Port 1 View  Result Date: 02/07/2018 CLINICAL DATA:  Endotracheal tube.  Hip replacement today EXAM: PORTABLE CHEST 1 VIEW COMPARISON:  02-07-18 FINDINGS: Endotracheal tube in good position. Right jugular central venous catheter tip in the lower SVC. NG tube in the stomach. No pneumothorax. Mild bibasilar atelectasis and small effusions. Negative for edema. IMPRESSION: Support lines in good position. Mild bibasilar atelectasis and small pleural effusions. Electronically Signed   By: Marlan Palau M.D.   On: 02/09/2018 19:13   Dg Abd  Portable 1v  Result Date: 02/12/18 CLINICAL DATA:  Orogastric tube placement. EXAM: PORTABLE ABDOMEN - 1 VIEW COMPARISON:  Abdominal  radiograph January 23, 2018 FINDINGS: Nasogastric tube tip projects in region of gastric body. Gas-filled nondistended small and large bowel. Multiple EKG lines overlie the patient and may obscure subtle underlying pathology. No intra-abdominal mass effect. Osteopenia. Multilevel vertebral body cement augmentation. RIGHT hip arthroplasty. IMPRESSION: 1. Nasogastric tube tip projects in gastric body. 2. Nonspecific bowel gas pattern. Electronically Signed   By: Awilda Metro M.D.   On: February 12, 2018 01:53   Dg Abd Portable 1v  Result Date: 01/24/2018 CLINICAL DATA:  Orogastric tube placement EXAM: PORTABLE ABDOMEN - 1 VIEW COMPARISON:  None. FINDINGS: Gastric tube placement in the gastric body. Nonobstructive bowel gas pattern. No ileus. Kyphoplasty L1 and L4. Bibasilar airspace disease IMPRESSION: Normal bowel gas pattern.  NG tube in the gastric body. Electronically Signed   By: Marlan Palau M.D.   On: 02/05/2018 19:15   Dg C-arm 1-60 Min  Result Date: 02/09/2018 CLINICAL DATA:  RIGHT hip and knee arthroplasty EXAM: OPERATIVE RIGHT HIP (WITH PELVIS IF PERFORMED) 2 VIEWS TECHNIQUE: Fluoroscopic spot image(s) were submitted for interpretation post-operatively. COMPARISON:  01/13/2018 FLUOROSCOPY TIME:  Time: 0 minutes 14.2 seconds FINDINGS: Osseous demineralization. Interval resection of RIGHT femoral head/neck and placement of a femoral prosthesis. No fracture dislocation identified. Degenerative changes of LEFT hip joint seen. IMPRESSION: RIGHT hip prosthesis without acute complication. Electronically Signed   By: Ulyses Southward M.D.   On: 02/02/2018 12:17   Dg C-arm 1-60 Min  Result Date: 01/30/2018 CLINICAL DATA:  RIGHT hip and knee arthroplasty EXAM: OPERATIVE RIGHT HIP (WITH PELVIS IF PERFORMED) 2 VIEWS TECHNIQUE: Fluoroscopic spot image(s) were submitted for interpretation post-operatively. COMPARISON:  01/11/2018 FLUOROSCOPY TIME:  Time: 0 minutes 14.2 seconds FINDINGS: Osseous demineralization.  Interval resection of RIGHT femoral head/neck and placement of a femoral prosthesis. No fracture dislocation identified. Degenerative changes of LEFT hip joint seen. IMPRESSION: RIGHT hip prosthesis without acute complication. Electronically Signed   By: Ulyses Southward M.D.   On: 02/06/2018 12:17   Dg Hip Operative Unilat W Or W/o Pelvis Right  Result Date: 02/06/2018 CLINICAL DATA:  RIGHT hip and knee arthroplasty EXAM: OPERATIVE RIGHT HIP (WITH PELVIS IF PERFORMED) 2 VIEWS TECHNIQUE: Fluoroscopic spot image(s) were submitted for interpretation post-operatively. COMPARISON:  01/03/2018 FLUOROSCOPY TIME:  Time: 0 minutes 14.2 seconds FINDINGS: Osseous demineralization. Interval resection of RIGHT femoral head/neck and placement of a femoral prosthesis. No fracture dislocation identified. Degenerative changes of LEFT hip joint seen. IMPRESSION: RIGHT hip prosthesis without acute complication. Electronically Signed   By: Ulyses Southward M.D.   On: 01/24/2018 12:17   Dg Hip Unilat W Or Wo Pelvis 2-3 Views Right  Result Date: 01/04/2018 CLINICAL DATA:  Fall, RIGHT hip pain. EXAM: DG HIP (WITH OR WITHOUT PELVIS) 2-3V RIGHT COMPARISON:  None. FINDINGS: Displaced fracture of the RIGHT femoral neck, partially obscured by the overlying greater trochanter, most likely subcapital region. Femoral head appears normally positioned relative to the acetabulum. Osseous structures of the pelvis are diffusely osteopenic limiting characterization of osseous detail, however, no additional fracture line or displaced fracture fragment is identified. Sacrum is partially obscured by overlying bowel gas. Extensive vascular calcifications within the pelvis and upper thighs. Soft tissues about the pelvis and RIGHT hip are otherwise unremarkable. IMPRESSION: 1. Displaced fracture of the RIGHT femoral neck, partially obscured by overlying osseous structures, most likely confined to the subcapital region. 2. Osteopenia. 3. Extensive vascular  calcifications. Electronically Signed   By: Bary Richard M.D.   On: 01/02/2018 16:04    Microbiology No results found for this or any previous visit (from the past 240 hour(s)).  Lab Basic Metabolic Panel: No results for input(s): NA, K, CL, CO2, GLUCOSE, BUN, CREATININE, CALCIUM, MG, PHOS in the last 168 hours. Liver Function Tests: No results for input(s): AST, ALT, ALKPHOS, BILITOT, PROT, ALBUMIN in the last 168 hours. No results for input(s): LIPASE, AMYLASE in the last 168 hours. No results for input(s): AMMONIA in the last 168 hours. CBC: No results for input(s): WBC, NEUTROABS, HGB, HCT, MCV, PLT in the last 168 hours. Cardiac Enzymes: No results for input(s): CKTOTAL, CKMB, CKMBINDEX, TROPONINI in the last 168 hours. Sepsis Labs: No results for input(s): PROCALCITON, WBC, LATICACIDVEN in the last 168 hours.  Procedures/Operations  R hip hemiarthroplasty.  Mechanical ventilation.   Jabes Primo 02/03/2018, 3:40 PM

## 2018-02-22 NOTE — Progress Notes (Addendum)
PCCM Interval Note   Called to patient bedside for progressive hypotension. Currently on 40 mcg/min Levophed and 0.03 Vasopressin. CVP 22. Minimal urine output. CBC, LA, ABG ordered. PH 7.2 given 2 AMPS bicarb, currently on bicarb gtt @ 125 ml/hr. Hemoglobin 7.0, PLTs 60. One unit RBC and one unit FFP ordered. Family updated at bedside. Understands severity of condition.   Addendum: Due to widening QRS, QTC 601, and Bradycardia, Amiodarone D/C   Jovita Kussmaul, AGACNP-BC [facility]  PCCM Pgr: [phone]

## 2018-02-22 DEATH — deceased

## 2020-03-16 IMAGING — CR DG CHEST 2V
2 series · 2 of 2 positions shown · non-contrast
Comparison: Chest x-ray dated July 10, 2017 and chest x-ray December 22, 2016

CLINICAL DATA: Two days of upper back pain

EXAM:
CHEST - 2 VIEW

[w chest lat]
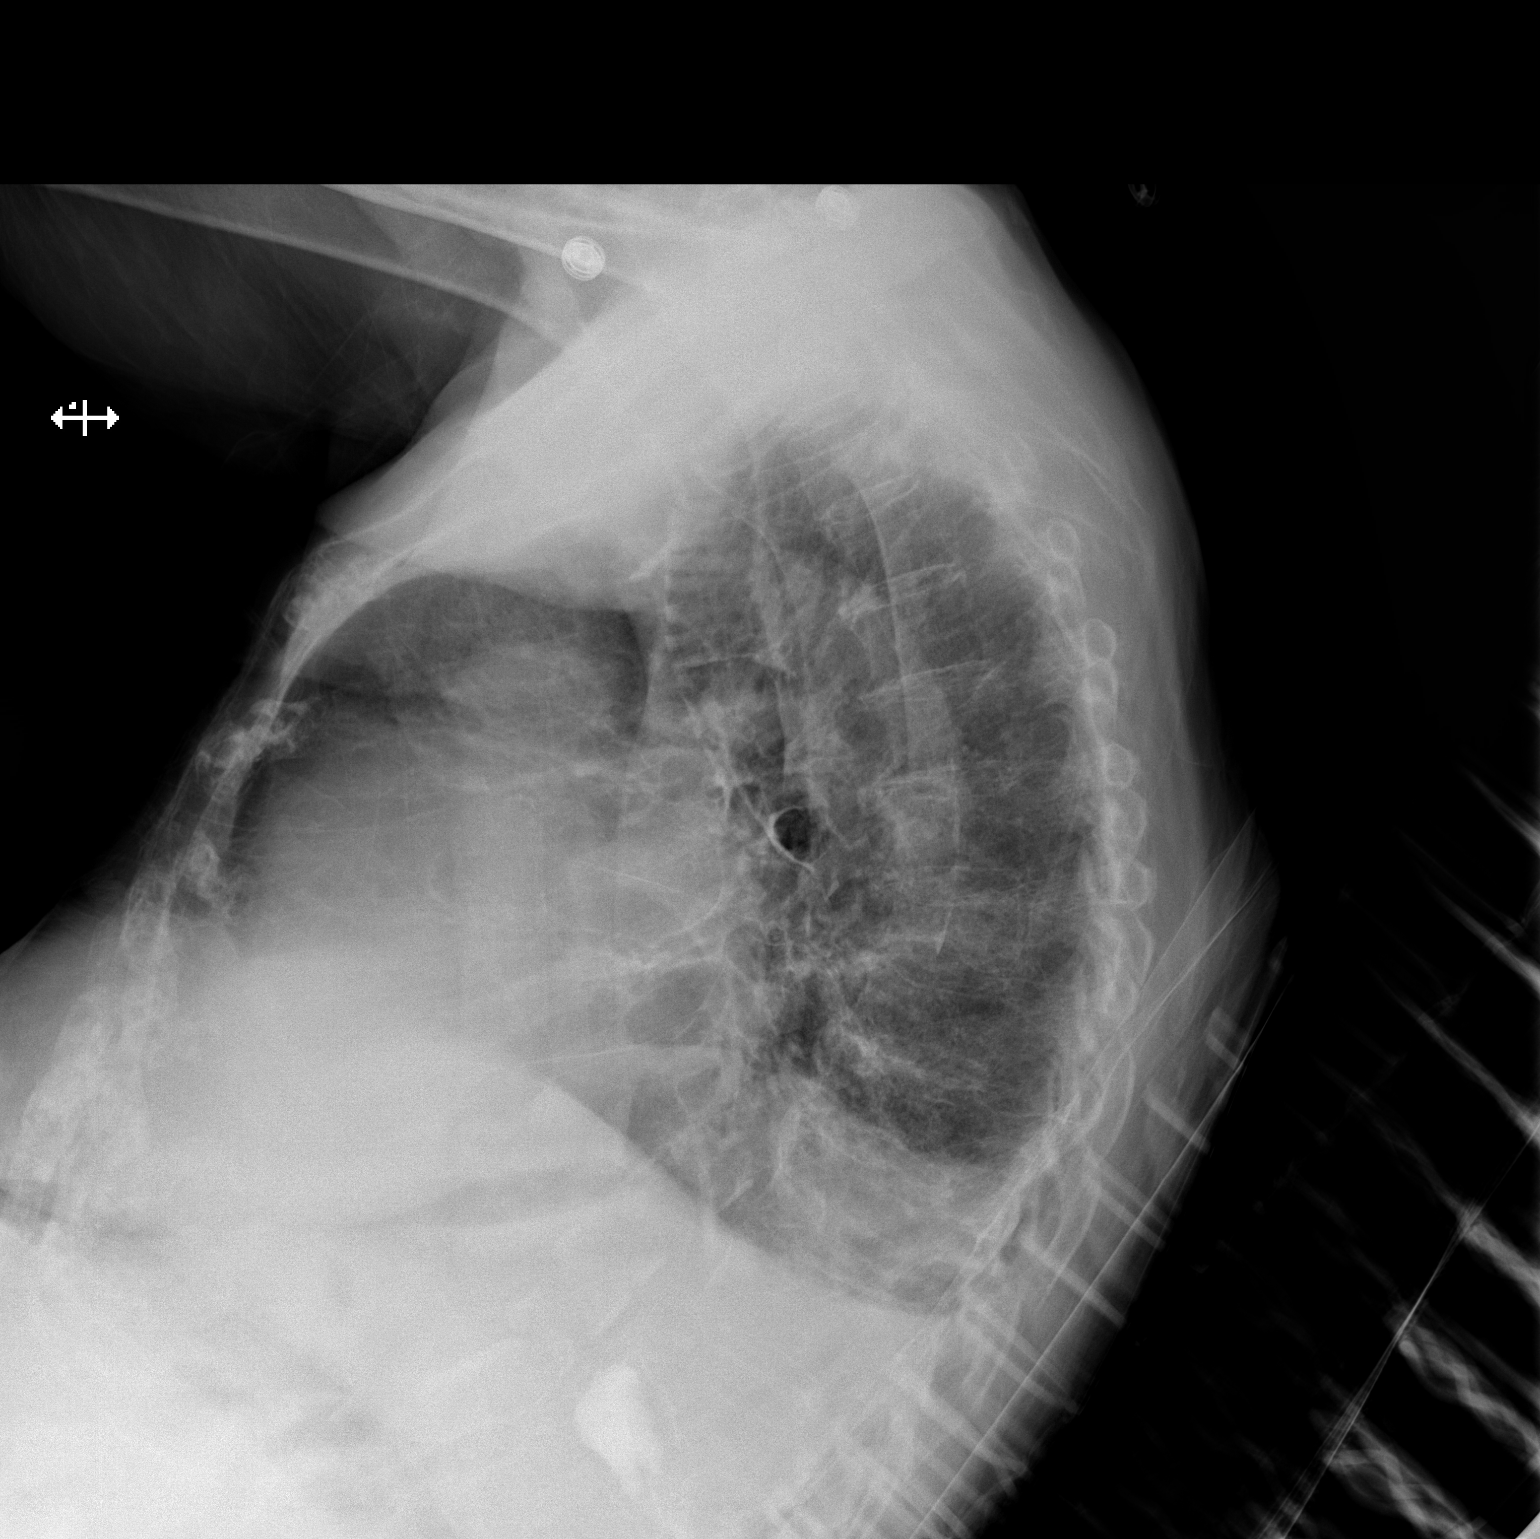

[x chest ap]
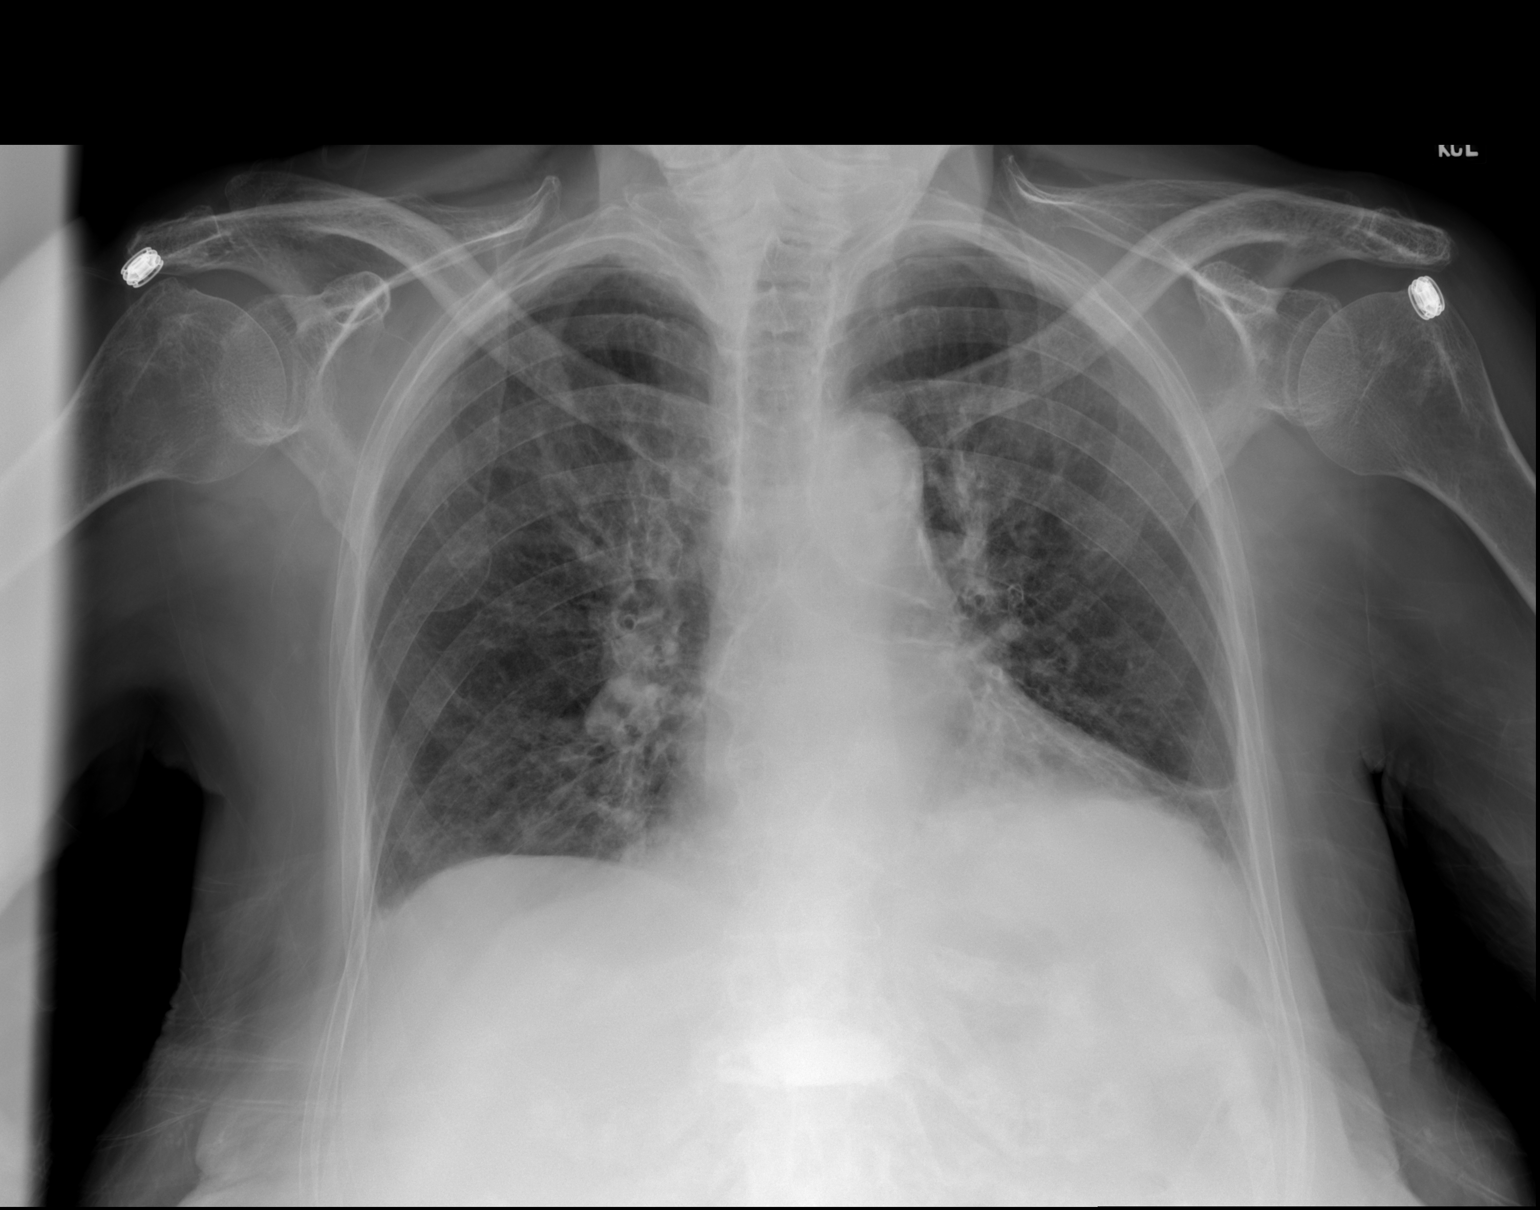

[2 of 2 positions shown; findings below may reference images not displayed]

FINDINGS: The lungs are adequately inflated. The interstitial markings are
coarse. This is not new however. There is chronically increased
density at the left lung base. There is there tiny bilateral pleural
effusions layering posteriorly which have decreased in size. Cardiac
silhouette is enlarged. The pulmonary vascularity is not engorged.
There is calcification in the wall of the thoracic aorta. There is
multilevel degenerative disc disease of the thoracic spine. Mild
loss of height of T8 and T10 is suspected. This is demonstrated to
better advantage on the accompanying thoracic spine series.
IMPRESSION: Chronic bronchitic changes. Small bilateral pleural effusions which
have decreased in size since the previous study. No acute pneumonia
nor pulmonary edema. Stable cardiomegaly.

Thoracic aortic atherosclerosis.

## 2020-03-17 IMAGING — MR MR THORACIC SPINE W/O CM
4 of 5 series · 20 of 48 positions shown · non-contrast
Comparison: Prior radiograph from 09/17/2017.

CLINICAL DATA: Initial evaluation for acute severe mid back pain.
Evaluate for possible compression fracture.

EXAM:
MRI THORACIC SPINE WITHOUT CONTRAST
TECHNIQUE: Multiplanar, multisequence MR imaging of the thoracic spine was
performed. No intravenous contrast was administered.

[Series 2: sag (id) · sagittal · 3.0mm · 1.88mm/px · 3 of 12 slices shown]
[im 2/12]
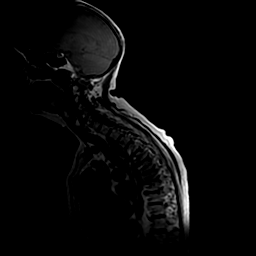
[im 6/12]
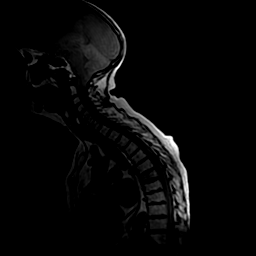
[im 10/12]
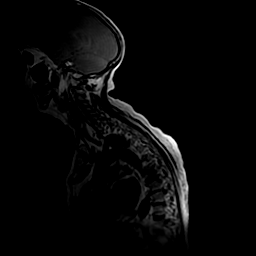

[Series 5: T1 · sagittal · 3.0mm · 0.64mm/px · 3 of 14 slices shown]
[im 3/14]
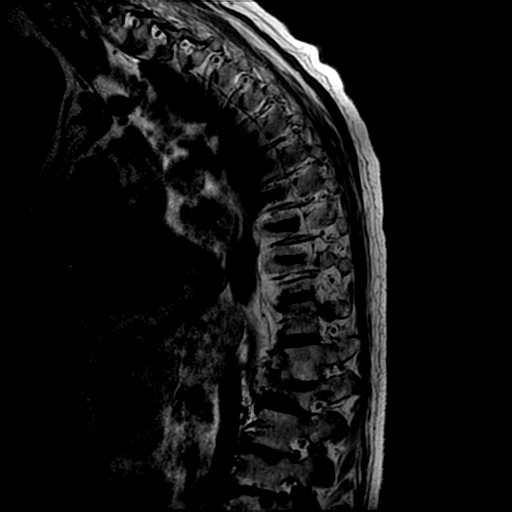
[im 8/14]
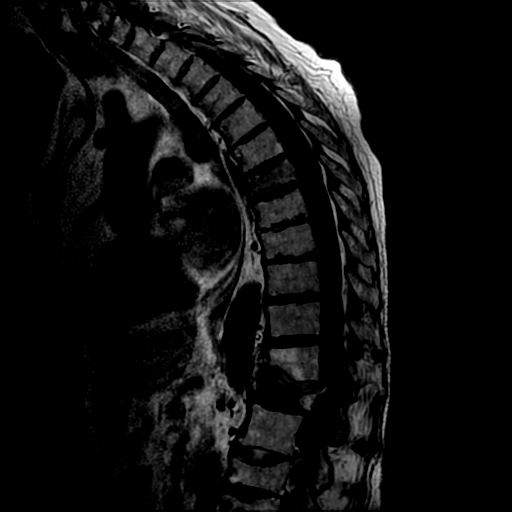
[im 14/14]
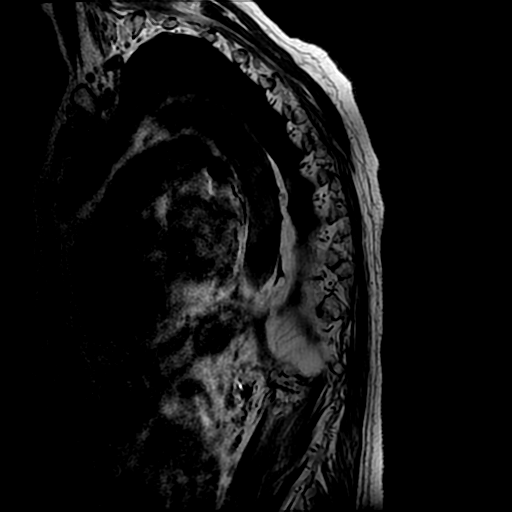

[Series 7: T2 · sagittal · 3.0mm · 0.64mm/px · 6 of 14 slices shown (1 of 2)]
[im 1/14]
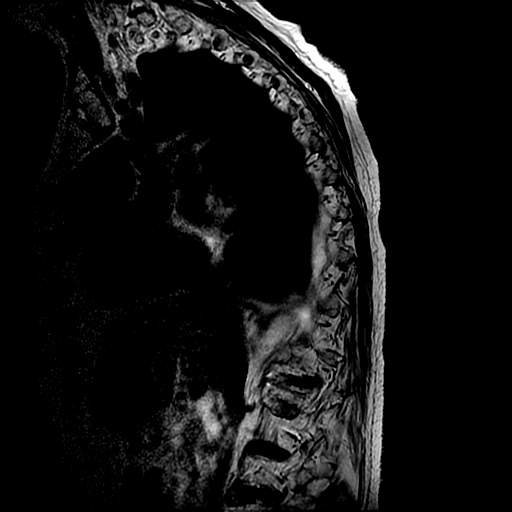
[im 3/14]
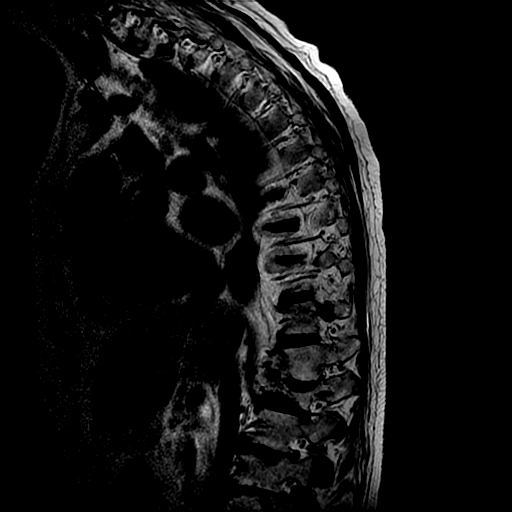
[im 6/14]
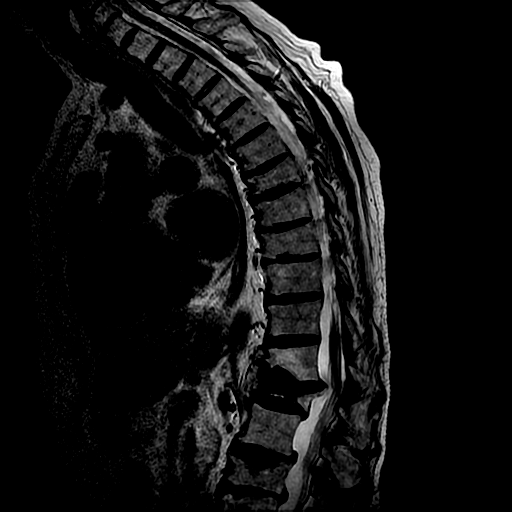
[im 8/14]
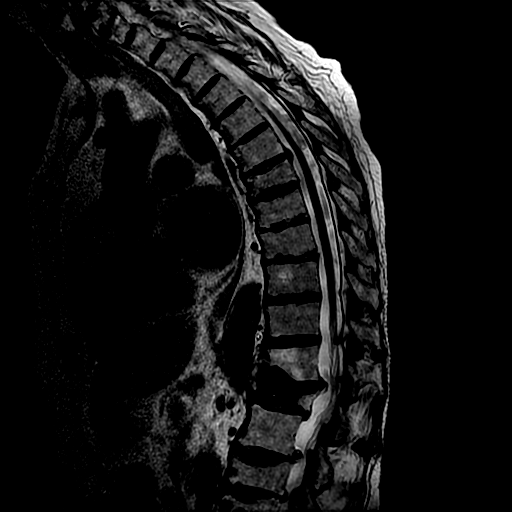
[im 11/14]
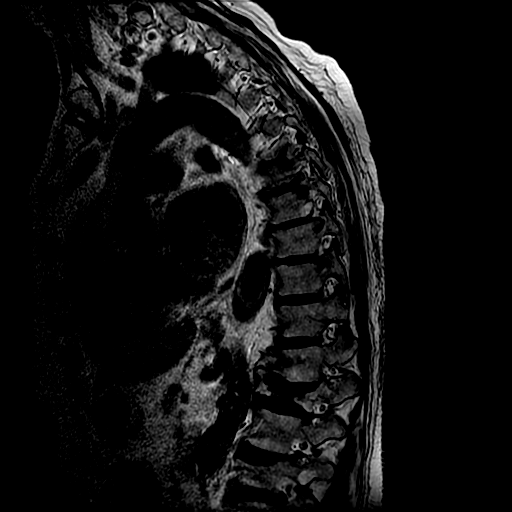
[im 14/14]
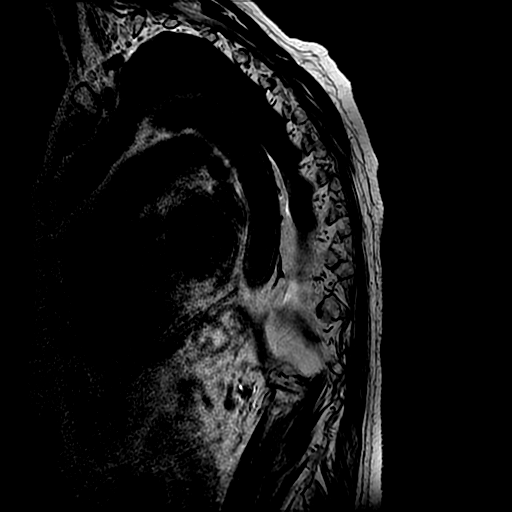

[Series 8: T2 · axial · 4.0mm · 0.39mm/px · z∈[-273,-108]mm · 8 of 50 slices shown (2 of 2)]
[im 3/50]
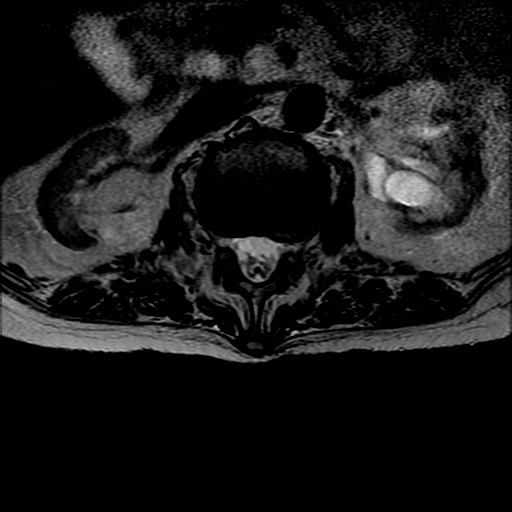
[im 7/50]
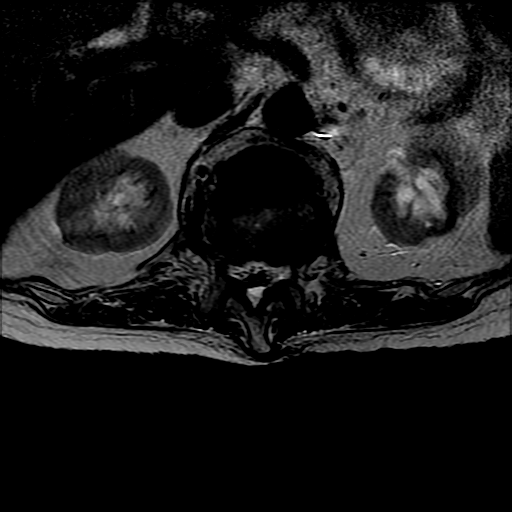
[im 9/50]
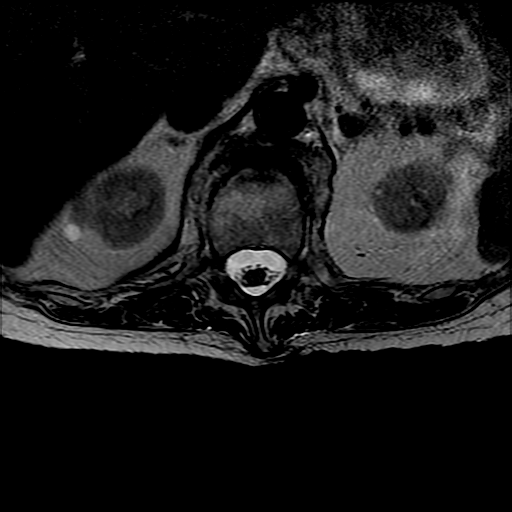
[im 16/50]
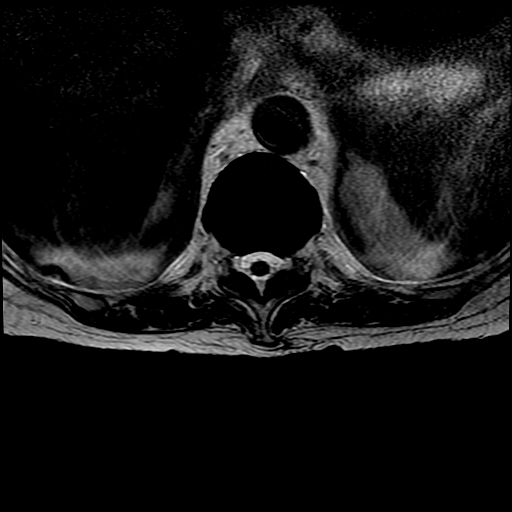
[im 23/50]
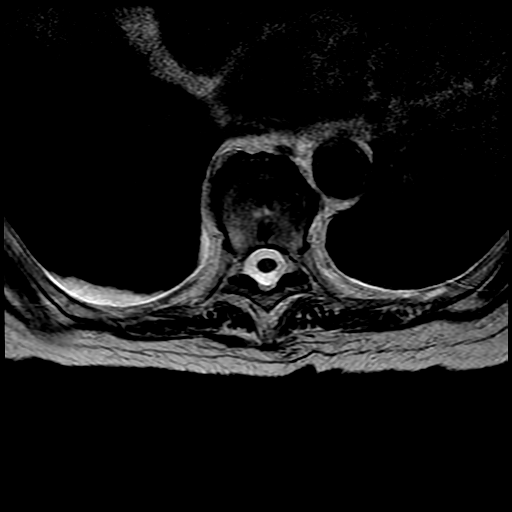
[im 25/50]
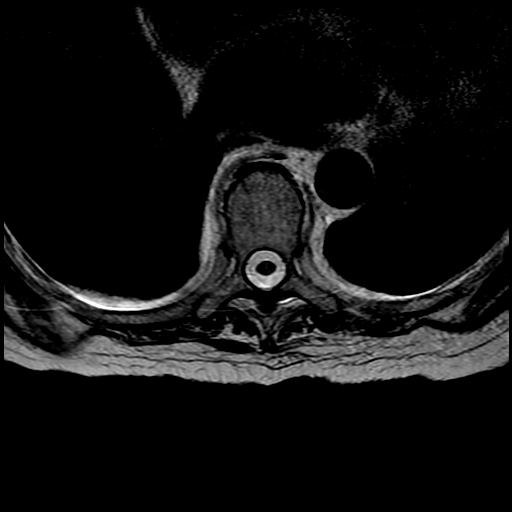
[im 27/50]
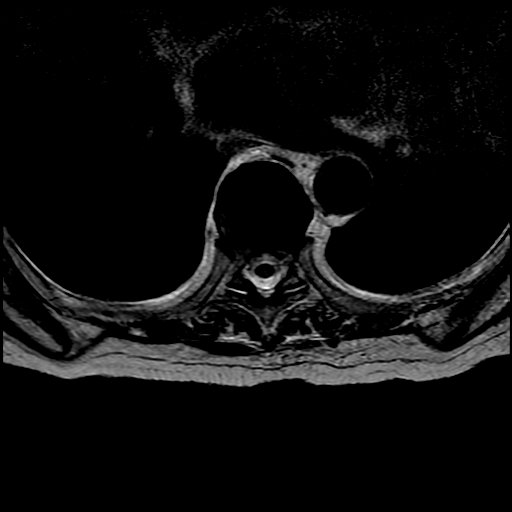
[im 43/50]
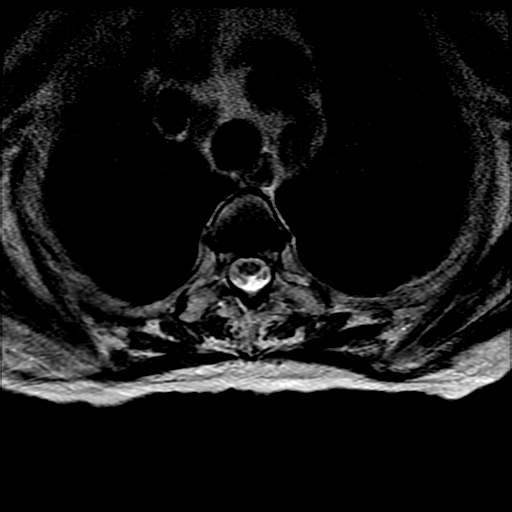

[20 of 48 positions shown; findings below may reference images not displayed]

FINDINGS: Alignment: Examination limited as the patient was unable to tolerate
the full length of the exam.

Exaggeration of the normal thoracic kyphosis.  No listhesis.

Vertebrae: There is an acute compression fracture involving the T7
vertebral body with associated marrow edema. Height loss measures up
to 40% height loss without bony retropulsion. This is
benign/osteoporotic in appearance with no underlying pathologic
lesion.

Chronic compression deformity at L1 with severe height loss and 6 mm
bony retropulsion. Sequelae of prior vertebral augmentation at this
level. Chronic height loss at the adjacent inferior endplate of T12.
Chronic compression deformity involving L3 noted as well. Vertebral
body heights otherwise maintained. No other acute, subacute, or
chronic compression fracture identified. Bone marrow signal
intensity within normal limits. Benign hemangioma noted within the
T10 vertebral body. No other discrete or worrisome osseous lesions.

Cord: Signal intensity within the thoracic spinal cord is normal.
Conus medullaris terminates at the L1-2 level.

Paraspinal and other soft tissues: Mild paraspinous soft tissue
edema adjacent to the T7 compression fracture. Paraspinous soft
tissues demonstrate no other acute abnormality. Small layering
bilateral pleural effusions. Kidneys are atrophic with multiple
renal cysts bilaterally.

Disc levels:

T5-6: Small central disc protrusion indents the ventral thecal sac
without significant stenosis.

T6-7: Moderate sized central disc protrusion indents the ventral
thecal sac. Mild cord flattening without cord signal changes. No
significant stenosis.

T9-10: Shallow right paracentral disc protrusion mildly flattens the
right ventral thecal sac. No stenosis.

T11-12: Bilateral facet hypertrophy, greater on the left. No
stenosis.

T12-L1: Disc bulge with 6 mm bony retropulsion related to the
chronic L1 compression fracture. Flattening with partial effacement
of the ventral CSF with mild cord flattening. Mild to moderate
spinal stenosis.
IMPRESSION: 1. Acute T7 compression fracture with up to 40% height loss without
bony retropulsion. This would be amendable to vertebral
augmentation.
2. Chronic L1 compression fracture with sequelae of prior vertebral
augmentation. Associated 6 mm bony retropulsion with mild to
moderate spinal stenosis.
3. Central disc protrusion at T6-7 with resultant mild spinal
stenosis.
4. Small layering bilateral pleural effusions.
